# Patient Record
Sex: Male | Born: 1935
Health system: Southern US, Community
[De-identification: ages and names within clinical notes are randomized; demographics above are authoritative.]

## PROBLEM LIST (undated history)

## (undated) DIAGNOSIS — IMO0001 Reserved for inherently not codable concepts without codable children: Secondary | ICD-10-CM

## (undated) DIAGNOSIS — K635 Polyp of colon: Secondary | ICD-10-CM

## (undated) DIAGNOSIS — I2510 Atherosclerotic heart disease of native coronary artery without angina pectoris: Secondary | ICD-10-CM

## (undated) DIAGNOSIS — IMO0002 Reserved for concepts with insufficient information to code with codable children: Secondary | ICD-10-CM

## (undated) DIAGNOSIS — R0600 Dyspnea, unspecified: Secondary | ICD-10-CM

## (undated) DIAGNOSIS — G43909 Migraine, unspecified, not intractable, without status migrainosus: Secondary | ICD-10-CM

## (undated) DIAGNOSIS — I10 Essential (primary) hypertension: Secondary | ICD-10-CM

## (undated) DIAGNOSIS — I499 Cardiac arrhythmia, unspecified: Secondary | ICD-10-CM

## (undated) DIAGNOSIS — C4490 Unspecified malignant neoplasm of skin, unspecified: Secondary | ICD-10-CM

## (undated) DIAGNOSIS — D509 Iron deficiency anemia, unspecified: Secondary | ICD-10-CM

## (undated) DIAGNOSIS — K219 Gastro-esophageal reflux disease without esophagitis: Secondary | ICD-10-CM

## (undated) DIAGNOSIS — E1165 Type 2 diabetes mellitus with hyperglycemia: Secondary | ICD-10-CM

## (undated) DIAGNOSIS — K5730 Diverticulosis of large intestine without perforation or abscess without bleeding: Secondary | ICD-10-CM

## (undated) DIAGNOSIS — E039 Hypothyroidism, unspecified: Secondary | ICD-10-CM

## (undated) DIAGNOSIS — M1990 Unspecified osteoarthritis, unspecified site: Secondary | ICD-10-CM

## (undated) DIAGNOSIS — K649 Unspecified hemorrhoids: Secondary | ICD-10-CM

## (undated) DIAGNOSIS — R413 Other amnesia: Secondary | ICD-10-CM

## (undated) DIAGNOSIS — I480 Paroxysmal atrial fibrillation: Secondary | ICD-10-CM

## (undated) HISTORY — PX: BONE MARROW BIOPSY: SHX199

## (undated) HISTORY — DX: Reserved for concepts with insufficient information to code with codable children: IMO0002

## (undated) HISTORY — DX: Dyspnea, unspecified: R06.00

## (undated) HISTORY — DX: Type 2 diabetes mellitus with hyperglycemia: E11.65

## (undated) HISTORY — DX: Hypothyroidism, unspecified: E03.9

## (undated) HISTORY — DX: Atherosclerotic heart disease of native coronary artery without angina pectoris: I25.10

## (undated) HISTORY — DX: Diverticulosis of large intestine without perforation or abscess without bleeding: K57.30

## (undated) HISTORY — DX: Polyp of colon: K63.5

## (undated) HISTORY — PX: POSTERIOR LAMINECTOMY / DECOMPRESSION LUMBAR SPINE: SUR740

## (undated) HISTORY — PX: SKIN CANCER EXCISION: SHX779

## (undated) HISTORY — DX: Paroxysmal atrial fibrillation: I48.0

## (undated) HISTORY — DX: Gastro-esophageal reflux disease without esophagitis: K21.9

## (undated) HISTORY — DX: Essential (primary) hypertension: I10

## (undated) HISTORY — DX: Reserved for inherently not codable concepts without codable children: IMO0001

## (undated) HISTORY — DX: Unspecified hemorrhoids: K64.9

## (undated) HISTORY — PX: NISSEN FUNDOPLICATION: SHX2091

## (undated) HISTORY — PX: CARDIAC CATHETERIZATION: SHX172

## (undated) HISTORY — DX: Iron deficiency anemia, unspecified: D50.9

## (undated) HISTORY — PX: CATARACT EXTRACTION: SUR2

## (undated) HISTORY — PX: BACK SURGERY: SHX140

---

## 2001-01-06 DIAGNOSIS — E039 Hypothyroidism, unspecified: Secondary | ICD-10-CM

## 2001-01-06 HISTORY — DX: Hypothyroidism, unspecified: E03.9

## 2003-08-15 ENCOUNTER — Encounter (HOSPITAL_COMMUNITY): Admission: RE | Admit: 2003-08-15 | Discharge: 2003-10-03 | Payer: Self-pay | Admitting: Endocrinology

## 2003-08-18 ENCOUNTER — Ambulatory Visit (HOSPITAL_COMMUNITY): Admission: RE | Admit: 2003-08-18 | Discharge: 2003-08-18 | Payer: Self-pay | Admitting: Endocrinology

## 2003-08-28 ENCOUNTER — Ambulatory Visit (HOSPITAL_COMMUNITY): Admission: RE | Admit: 2003-08-28 | Discharge: 2003-08-28 | Payer: Self-pay | Admitting: Endocrinology

## 2003-08-28 ENCOUNTER — Encounter (INDEPENDENT_AMBULATORY_CARE_PROVIDER_SITE_OTHER): Payer: Self-pay | Admitting: *Deleted

## 2003-10-26 ENCOUNTER — Observation Stay (HOSPITAL_COMMUNITY): Admission: RE | Admit: 2003-10-26 | Discharge: 2003-10-27 | Payer: Self-pay | Admitting: Surgery

## 2003-10-26 ENCOUNTER — Encounter (INDEPENDENT_AMBULATORY_CARE_PROVIDER_SITE_OTHER): Payer: Self-pay | Admitting: *Deleted

## 2004-02-22 ENCOUNTER — Ambulatory Visit: Payer: Self-pay | Admitting: Cardiology

## 2004-02-27 ENCOUNTER — Inpatient Hospital Stay (HOSPITAL_COMMUNITY): Admission: RE | Admit: 2004-02-27 | Discharge: 2004-02-28 | Payer: Self-pay | Admitting: Neurosurgery

## 2005-11-11 ENCOUNTER — Ambulatory Visit: Payer: Self-pay | Admitting: Internal Medicine

## 2005-12-22 ENCOUNTER — Ambulatory Visit: Payer: Self-pay | Admitting: Internal Medicine

## 2006-02-03 ENCOUNTER — Ambulatory Visit: Payer: Self-pay | Admitting: Internal Medicine

## 2006-05-27 ENCOUNTER — Ambulatory Visit: Payer: Self-pay | Admitting: Cardiology

## 2006-06-25 ENCOUNTER — Ambulatory Visit: Payer: Self-pay | Admitting: Cardiology

## 2006-07-15 ENCOUNTER — Ambulatory Visit: Payer: Self-pay | Admitting: Cardiology

## 2006-07-16 ENCOUNTER — Ambulatory Visit (HOSPITAL_COMMUNITY): Admission: RE | Admit: 2006-07-16 | Discharge: 2006-07-16 | Payer: Self-pay | Admitting: Cardiology

## 2006-07-16 ENCOUNTER — Ambulatory Visit: Payer: Self-pay | Admitting: Cardiology

## 2006-07-22 ENCOUNTER — Ambulatory Visit: Payer: Self-pay | Admitting: Cardiology

## 2006-07-30 ENCOUNTER — Ambulatory Visit: Payer: Self-pay | Admitting: Cardiology

## 2006-08-05 ENCOUNTER — Ambulatory Visit (HOSPITAL_COMMUNITY): Admission: RE | Admit: 2006-08-05 | Discharge: 2006-08-05 | Payer: Self-pay | Admitting: Cardiology

## 2006-08-05 ENCOUNTER — Ambulatory Visit: Payer: Self-pay | Admitting: Internal Medicine

## 2006-08-06 ENCOUNTER — Ambulatory Visit: Payer: Self-pay | Admitting: Cardiology

## 2006-09-01 ENCOUNTER — Encounter: Payer: Self-pay | Admitting: Physician Assistant

## 2006-09-01 ENCOUNTER — Ambulatory Visit: Payer: Self-pay | Admitting: Cardiology

## 2007-01-27 ENCOUNTER — Ambulatory Visit: Payer: Self-pay | Admitting: Internal Medicine

## 2007-03-25 ENCOUNTER — Ambulatory Visit: Payer: Self-pay | Admitting: Gastroenterology

## 2007-04-01 ENCOUNTER — Ambulatory Visit: Payer: Self-pay | Admitting: Internal Medicine

## 2007-04-06 ENCOUNTER — Ambulatory Visit: Payer: Self-pay | Admitting: Internal Medicine

## 2007-04-06 ENCOUNTER — Ambulatory Visit (HOSPITAL_COMMUNITY): Admission: RE | Admit: 2007-04-06 | Discharge: 2007-04-06 | Payer: Self-pay | Admitting: Internal Medicine

## 2007-04-06 ENCOUNTER — Encounter: Payer: Self-pay | Admitting: Internal Medicine

## 2007-04-06 DIAGNOSIS — K635 Polyp of colon: Secondary | ICD-10-CM

## 2007-04-06 HISTORY — DX: Polyp of colon: K63.5

## 2007-04-06 HISTORY — PX: COLONOSCOPY: SHX174

## 2007-04-06 HISTORY — PX: ESOPHAGOGASTRODUODENOSCOPY: SHX1529

## 2007-04-19 ENCOUNTER — Ambulatory Visit (HOSPITAL_COMMUNITY): Admission: RE | Admit: 2007-04-19 | Discharge: 2007-04-19 | Payer: Self-pay | Admitting: Internal Medicine

## 2007-04-28 ENCOUNTER — Ambulatory Visit: Payer: Self-pay | Admitting: Internal Medicine

## 2007-08-06 DIAGNOSIS — D509 Iron deficiency anemia, unspecified: Secondary | ICD-10-CM

## 2007-08-06 DIAGNOSIS — D47Z9 Other specified neoplasms of uncertain behavior of lymphoid, hematopoietic and related tissue: Secondary | ICD-10-CM

## 2007-08-06 DIAGNOSIS — R5381 Other malaise: Secondary | ICD-10-CM

## 2007-08-06 DIAGNOSIS — I480 Paroxysmal atrial fibrillation: Secondary | ICD-10-CM

## 2007-08-06 DIAGNOSIS — E1165 Type 2 diabetes mellitus with hyperglycemia: Secondary | ICD-10-CM

## 2007-08-06 DIAGNOSIS — K219 Gastro-esophageal reflux disease without esophagitis: Secondary | ICD-10-CM | POA: Insufficient documentation

## 2007-08-06 DIAGNOSIS — E039 Hypothyroidism, unspecified: Secondary | ICD-10-CM | POA: Insufficient documentation

## 2007-08-06 DIAGNOSIS — I10 Essential (primary) hypertension: Secondary | ICD-10-CM | POA: Insufficient documentation

## 2007-08-06 DIAGNOSIS — R5383 Other fatigue: Secondary | ICD-10-CM

## 2007-08-30 ENCOUNTER — Encounter: Payer: Self-pay | Admitting: Cardiology

## 2007-09-17 ENCOUNTER — Encounter: Payer: Self-pay | Admitting: Cardiology

## 2007-09-24 ENCOUNTER — Encounter: Payer: Self-pay | Admitting: Cardiology

## 2007-09-28 ENCOUNTER — Encounter: Payer: Self-pay | Admitting: Cardiology

## 2007-09-30 ENCOUNTER — Encounter: Payer: Self-pay | Admitting: Cardiology

## 2007-10-19 ENCOUNTER — Encounter: Payer: Self-pay | Admitting: Cardiology

## 2007-10-29 ENCOUNTER — Encounter: Payer: Self-pay | Admitting: Cardiology

## 2007-11-11 ENCOUNTER — Ambulatory Visit: Payer: Self-pay | Admitting: Cardiology

## 2008-06-01 ENCOUNTER — Encounter: Payer: Self-pay | Admitting: Cardiology

## 2008-09-26 DIAGNOSIS — R0609 Other forms of dyspnea: Secondary | ICD-10-CM | POA: Insufficient documentation

## 2008-09-26 DIAGNOSIS — I2510 Atherosclerotic heart disease of native coronary artery without angina pectoris: Secondary | ICD-10-CM | POA: Insufficient documentation

## 2008-09-26 DIAGNOSIS — R0602 Shortness of breath: Secondary | ICD-10-CM

## 2008-10-05 ENCOUNTER — Encounter (INDEPENDENT_AMBULATORY_CARE_PROVIDER_SITE_OTHER): Payer: Self-pay | Admitting: *Deleted

## 2008-10-13 ENCOUNTER — Encounter: Payer: Self-pay | Admitting: Cardiology

## 2008-10-20 ENCOUNTER — Ambulatory Visit: Payer: Self-pay | Admitting: Gastroenterology

## 2008-10-20 DIAGNOSIS — K5909 Other constipation: Secondary | ICD-10-CM | POA: Insufficient documentation

## 2008-10-24 ENCOUNTER — Encounter: Payer: Self-pay | Admitting: Internal Medicine

## 2008-10-26 ENCOUNTER — Encounter: Payer: Self-pay | Admitting: Internal Medicine

## 2008-11-01 ENCOUNTER — Encounter: Payer: Self-pay | Admitting: Cardiology

## 2008-11-29 ENCOUNTER — Ambulatory Visit: Payer: Self-pay | Admitting: Cardiology

## 2008-12-03 ENCOUNTER — Encounter: Payer: Self-pay | Admitting: Cardiology

## 2008-12-04 ENCOUNTER — Encounter: Payer: Self-pay | Admitting: Cardiology

## 2009-05-02 ENCOUNTER — Encounter: Payer: Self-pay | Admitting: Internal Medicine

## 2009-06-01 ENCOUNTER — Ambulatory Visit: Payer: Self-pay | Admitting: Cardiology

## 2009-09-26 ENCOUNTER — Ambulatory Visit (HOSPITAL_COMMUNITY): Admission: RE | Admit: 2009-09-26 | Discharge: 2009-09-26 | Payer: Self-pay | Admitting: Neurosurgery

## 2009-11-02 ENCOUNTER — Encounter: Payer: Self-pay | Admitting: Internal Medicine

## 2009-11-19 ENCOUNTER — Telehealth (INDEPENDENT_AMBULATORY_CARE_PROVIDER_SITE_OTHER): Payer: Self-pay

## 2010-02-06 ENCOUNTER — Other Ambulatory Visit: Payer: Self-pay | Admitting: Orthopaedic Surgery

## 2010-02-06 DIAGNOSIS — M79602 Pain in left arm: Secondary | ICD-10-CM

## 2010-02-06 DIAGNOSIS — M542 Cervicalgia: Secondary | ICD-10-CM

## 2010-02-06 NOTE — Letter (Signed)
Summary: External Other  External Other   Imported By: Peggyann Shoals 05/02/2009 10:38:10  _____________________________________________________________________  External Attachment:    Type:   Image     Comment:   External Document

## 2010-02-06 NOTE — Letter (Signed)
Summary: OFFICE NOTE FROM DR Leslie Dales  OFFICE NOTE FROM DR ALTHEIMER   Imported By: Rexene Alberts 11/02/2009 09:01:43  _____________________________________________________________________  External Attachment:    Type:   Image     Comment:   External Document

## 2010-02-06 NOTE — Progress Notes (Signed)
Summary: samples  Phone Note Call from Patient   Caller: Patient Summary of Call: pt came to office requesting Amitiza samples- per LSL can give 1 weeks worth but pt will need an ov to get any more samples or Rx. Gave pt 2 boxes and informed him he will need an ov for further treatment. pt verbalized understanding.  Initial call taken by: Hendricks Limes LPN,  November 19, 2009 2:40 PM

## 2010-02-06 NOTE — Assessment & Plan Note (Signed)
Summary: 6 MO FU PER MAY REMINDER-SRS  Medications Added SYNTHROID 125 MCG TABS (LEVOTHYROXINE SODIUM) Take 1 tablet by mouth once a day METOPROLOL TARTRATE 25 MG TABS (METOPROLOL TARTRATE) Take one tablet by mouth twice a day BENAZEPRIL HCL 10 MG TABS (BENAZEPRIL HCL) Take 1 tablet by mouth once a day BENAZEPRIL HCL 5 MG TABS (BENAZEPRIL HCL) Take 1 tablet by mouth once a day FEXOFENADINE HCL 180 MG TABS (FEXOFENADINE HCL) Take 1 tablet by mouth once a day FINASTERIDE 5 MG TABS (FINASTERIDE) Take 1 tablet by mouth once a day FLOMAX 0.4 MG CAPS (TAMSULOSIN HCL) Take 1 tablet by mouth once a day TRAMADOL HCL 50 MG TABS (TRAMADOL HCL) Take 1 tablet by mouth once a day        Visit Type:  Follow-up Referring Provider:  Casimiro Needle Altheimer Primary Provider:  Dr. Doreen Beam   History of Present Illness: the patient is a 77 year old male with a history of nonobstructive coronary artery disease and paroxysmal atrial fibrillation as well as hypertension.  The patient has remained in normal sinus rhythm.  He has been doing well from a cardiovascular standpoint.  He reports no chest pain shortness of breath orthopnea or PND.  He continues to remain physically very active and works on a daily basis.  Preventive Screening-Counseling & Management  Alcohol-Tobacco     Smoking Status: quit  Comments: quit smoking about 25 yrs ago, smoked for a few years when young  Current Medications (verified): 1)  Metformin Hcl 500 Mg  Tabs (Metformin Hcl) .... Take 1 Tablet By Mouth Two Times A Day 2)  Uroxatral 10 Mg  Xr24h-Tab (Alfuzosin Hcl) .... Take 1 Tablet By Mouth Once A Day 3)  Synthroid 125 Mcg Tabs (Levothyroxine Sodium) .... Take 1 Tablet By Mouth Once A Day 4)  Metoprolol Tartrate 25 Mg Tabs (Metoprolol Tartrate) .... Take One Tablet By Mouth Twice A Day 5)  Amlodipine Besylate 10 Mg  Tabs (Amlodipine Besylate) .... Take 1 Tablet By Mouth Once A Day 6)  Hydrochlorothiazide 25 Mg  Tabs  (Hydrochlorothiazide) .... Take 1 Tablet By Mouth Once A Day 7)  Pantoprazole Sodium 40 Mg  Tbec (Pantoprazole Sodium) .... Take 1 Tablet By Mouth Once A Day 8)  Aspirin 81 Mg Chew (Aspirin) .... One By Mouth Daily 9)  Amitiza 8 Mcg Caps (Lubiprostone) .... One By Mouth With Food Up To Two Times A Day As Needed For Constipation 10)  Benefiber  Powd (Wheat Dextrin) .... Use Once Daily As Directed 11)  Celebrex 200 Mg Caps (Celecoxib) .... As Needed 12)  Benazepril Hcl 10 Mg Tabs (Benazepril Hcl) .... Take 1 Tablet By Mouth Once A Day 13)  Fexofenadine Hcl 180 Mg Tabs (Fexofenadine Hcl) .... Take 1 Tablet By Mouth Once A Day 14)  Finasteride 5 Mg Tabs (Finasteride) .... Take 1 Tablet By Mouth Once A Day 15)  Flomax 0.4 Mg Caps (Tamsulosin Hcl) .... Take 1 Tablet By Mouth Once A Day 16)  Tramadol Hcl 50 Mg Tabs (Tramadol Hcl) .... Take 1 Tablet By Mouth Once A Day  Allergies: No Known Drug Allergies  Comments:  Nurse/Medical Assistant: The patient's medications were reviewed with the patient and were updated in the Medication List. Pt brought a list of medications to office visit.  Cyril Loosen, RN, BSN (Jun 01, 2009 9:18 AM)   Past History:  Past Medical History: Last updated: 11/29/2008 CAD, NATIVE VESSEL (ICD-414.01) DYSPNEA (ICD-786.05) GASTROESOPHAGEAL REFLUX DISEASE, CHRONIC (ICD-530.81) Hx of ATRIAL FIBRILLATION, HX  OF (ICD-V12.59) OTHER LYMPHATIC AND HEMATOPOIETIC TISSUES (ICD-238.79) HYPOTHYROIDISM (ICD-244.9) FATIGUE, CHRONIC (ICD-780.79) HYPERTENSION (ICD-401.9) DIABETES MELLITUS, TYPE II, UNCONTROLLED (ICD-250.02) ANEMIA, IRON DEFICIENCY (ICD-280.9) 1. Persistent dyspnea     a.     Normal recent cardiopulmonary function test in July 2008.     b.     Undergoing current ENT evaluation at [facility].     c.     Recent negative echocardiogram with "bubble" study for inter-      cardiac shunt. 2. Normal left ventricular function. 3.  Nonobstructive coronary artery disease     a.     A cardiac catheterization in July 2008. 4. Persistent cough     a.     No recent amelioration following the addition of      Protonix/discontinuation of ACE inhibitor.     b.     Status post Nissen fundoplication 5. History of paroxysmal atrial fibrillation with rapid ventricular     response. 6. Hypothyroidism     a.     Status post total thyroidectomy in 2003. 7. Type 2 diabetes mellitus. 8. Hypertension.     Past Surgical History: Last updated: 09/26/2008 Nissen fundoplication  Bone marrow biopsy Back Surgery  Family History: Last updated: 09/26/2008 Family History of Cancer:  Family History of CVA or Stroke:   Social History: Last updated: 09/26/2008 Tobacco Use - No.  Alcohol Use - no Full Time Divorced   Social History: Smoking Status:  quit  Review of Systems  The patient denies fatigue, malaise, fever, weight gain/loss, vision loss, decreased hearing, hoarseness, chest pain, palpitations, shortness of breath, prolonged cough, wheezing, sleep apnea, coughing up blood, abdominal pain, blood in stool, nausea, vomiting, diarrhea, heartburn, incontinence, blood in urine, muscle weakness, joint pain, leg swelling, rash, skin lesions, headache, fainting, dizziness, depression, anxiety, enlarged lymph nodes, easy bruising or bleeding, and environmental allergies.    Vital Signs:  Patient profile:   77 year old male Height:      70 inches Weight:      201.50 pounds Pulse rate:   69 / minute BP sitting:   142 / 94  (left arm) Cuff size:   regular  Vitals Entered By: Cyril Loosen, RN, BSN (Jun 01, 2009 9:09 AM) Comments follow up office visit   Physical Exam  Additional Exam:  General: Well-developed, well-nourished in no distress head: Normocephalic and atraumatic eyes PERRLA/EOMI intact, conjunctiva and lids normal nose: No deformity or lesions mouth normal dentition, normal posterior pharynx neck: Supple,  no JVD.  No masses, thyromegaly or abnormal cervical nodes lungs: Normal breath sounds bilaterally without wheezing.  Normal percussion heart: regular rate and rhythm with normal S1 and S2, no S3 or S4.  PMI is normal.  No pathological murmurs abdomen: Normal bowel sounds, abdomen is soft and nontender without masses, organomegaly or hernias noted.  No hepatosplenomegaly musculoskeletal: Back normal, normal gait muscle strength and tone normal pulsus: Pulse is normal in all 4 extremities Extremities: No peripheral pitting edema neurologic: Alert and oriented x 3 skin: Intact without lesions or rashes cervical nodes: No significant adenopathy psychologic: Normal affect    EKG  Procedure date:  06/01/2009  Findings:      normal sinus rhythm normal EKG heart rate 68 beats/min  Impression & Recommendations:  Problem # 1:  CAD, NATIVE VESSEL (ICD-414.01) nonobstructive coronary artery disease no chest pain.  Patient's last catheterization was in 2008.  EKG shows no acute ischemic changes His  updated medication list for this problem includes:    Metoprolol Tartrate 25 Mg Tabs (Metoprolol tartrate) .Marland Kitchen... Take one tablet by mouth twice a day    Amlodipine Besylate 10 Mg Tabs (Amlodipine besylate) .Marland Kitchen... Take 1 tablet by mouth once a day    Aspirin 81 Mg Chew (Aspirin) ..... One by mouth daily    Benazepril Hcl 10 Mg Tabs (Benazepril hcl) .Marland Kitchen... Take 1 tablet by mouth once a day  Orders: EKG w/ Interpretation (93000)  Problem # 2:  Hx of ATRIAL FIBRILLATION, HX OF (ICD-V12.59) the patient reports no palpitations and he remains in normal sinus rhythm.  I have made no changes in his medical regimen. His updated medication list for this problem includes:    Metoprolol Tartrate 25 Mg Tabs (Metoprolol tartrate) .Marland Kitchen... Take one tablet by mouth twice a day    Amlodipine Besylate 10 Mg Tabs (Amlodipine besylate) .Marland Kitchen... Take 1 tablet by mouth once a day    Aspirin 81 Mg Chew (Aspirin) ..... One by  mouth daily    Benazepril Hcl 10 Mg Tabs (Benazepril hcl) .Marland Kitchen... Take 1 tablet by mouth once a day  Problem # 3:  HYPERTENSION (ICD-401.9) blood pressure is poorly controlled and have increased benazapril to 10 mg p.o. daily. His updated medication list for this problem includes:    Metoprolol Tartrate 25 Mg Tabs (Metoprolol tartrate) .Marland Kitchen... Take one tablet by mouth twice a day    Amlodipine Besylate 10 Mg Tabs (Amlodipine besylate) .Marland Kitchen... Take 1 tablet by mouth once a day    Hydrochlorothiazide 25 Mg Tabs (Hydrochlorothiazide) .Marland Kitchen... Take 1 tablet by mouth once a day    Aspirin 81 Mg Chew (Aspirin) ..... One by mouth daily    Benazepril Hcl 10 Mg Tabs (Benazepril hcl) .Marland Kitchen... Take 1 tablet by mouth once a day  Patient Instructions: 1)  Increase Benazepril to 10mg  daily 2)  Follow up in  1 year  Prescriptions: HYDROCHLOROTHIAZIDE 25 MG  TABS (HYDROCHLOROTHIAZIDE) Take 1 tablet by mouth once a day  #90 x 3   Entered by:   Hoover Brunette, LPN   Authorized by:   Lewayne Bunting, MD, [facility]   Signed by:   Hoover Brunette, LPN on 16/10/9602   Method used:   Electronically to        [facility] # [phone]* (retail)       [address]       [place], [place]  [address]       Ph: [phone] or [phone]       Fax: [phone]   RxID:   [id number] AMLODIPINE BESYLATE 10 MG  TABS (AMLODIPINE BESYLATE) Take 1 tablet by mouth once a day  #90 x 3   Entered by:   Hoover Brunette, LPN   Authorized by:   Lewayne Bunting, MD, [facility]   Signed by:   Hoover Brunette, LPN on 74/25/9563   Method used:   Electronically to        [facility] # [phone]* (retail)       [address]       [place], [place]  [address]       Ph: [phone] or [phone]       Fax: [phone]   RxID:   [id number] METOPROLOL TARTRATE 25 MG TABS (METOPROLOL TARTRATE) Take one tablet by mouth twice a day  #180 x 3   Entered by:   Hoover Brunette, LPN  Authorized by:   Lewayne Bunting, MD, [facility]   Signed by:   Hoover Brunette, LPN on  16/10/9602   Method used:   Electronically to        [facility] # [phone]* (retail)       [address]       [place], [place]  [address]       Ph: [phone] or [phone]       Fax: [phone]   RxID:   [id number] BENAZEPRIL HCL 10 MG TABS (BENAZEPRIL HCL) Take 1 tablet by mouth once a day  #90 x 3   Entered by:   Hoover Brunette, LPN   Authorized by:   Lewayne Bunting, MD, [facility]   Signed by:   Hoover Brunette, LPN on 74/25/9563   Method used:   Electronically to        [facility] # [phone]* (retail)       [address]       [place], [place]  [address]       Ph: [phone] or [phone]       Fax: [phone]   RxID:   [id number]

## 2010-02-07 ENCOUNTER — Other Ambulatory Visit: Payer: Self-pay | Admitting: Orthopaedic Surgery

## 2010-02-07 DIAGNOSIS — Z139 Encounter for screening, unspecified: Secondary | ICD-10-CM

## 2010-02-08 ENCOUNTER — Ambulatory Visit
Admission: RE | Admit: 2010-02-08 | Discharge: 2010-02-08 | Disposition: A | Discharge status: Home / Self Care | Payer: PRIVATE HEALTH INSURANCE | Source: Ambulatory Visit | Attending: Orthopaedic Surgery | Admitting: Orthopaedic Surgery

## 2010-02-08 DIAGNOSIS — M542 Cervicalgia: Secondary | ICD-10-CM

## 2010-02-08 DIAGNOSIS — M79602 Pain in left arm: Secondary | ICD-10-CM

## 2010-02-08 DIAGNOSIS — Z139 Encounter for screening, unspecified: Secondary | ICD-10-CM

## 2010-03-21 LAB — GLUCOSE, CAPILLARY
Glucose-Capillary: 135 mg/dL — ABNORMAL HIGH (ref 70–99)
Glucose-Capillary: 161 mg/dL — ABNORMAL HIGH (ref 70–99)

## 2010-05-21 NOTE — Assessment & Plan Note (Signed)
[name]                          [facility] OFFICE NOTE   Brett Matthews, NAVE                      MRN:          [id number]  DATE:07/22/2006                            DOB:          1932-11-06    HISTORY OF PRESENT ILLNESS:  The patient is a 77 year old male with a  history of dyspnea, cough, and weakness.  Please see my extensive note  on July 15, 2006 as it relates to this patient's problem.  In essence,  the patient underwent a cardiac catheterization to rule out  cardiovascular disease.  He has nonobstructive coronary artery disease,  normal left ventricular systolic function, and essentially normal right  heart pressures.  Dr. Antoine Poche thought that the patient may have had a  very small left to right shunt with a slight step off in his oxygen  level in the high right atrium.  However, this would not explain the  patient's dyspnea.  He was also worked up by the pulmonologist at  [facility] with a negative CT scan, and negative for pulmonary embolism.  The patient states, however, that he continues to have a cough and  shortness of breath.  The patient has a history of Nissen  fundoplication, reflux, as well as nasal congestion.  I suspect all  these issues are contributing to his cough, and it is also possible that  the ACE inhibitor is worsening his cough.   MEDICATIONS:  1. Metformin 500 mg p.o. b.i.d.  2. Synthroid 137 mcg p.o. daily.  3. Lotrel 5/20 daily.  4. Metoprolol 50 mg p.o. b.i.d.  5. Isosorbide mononitrate 30 mg p.o. daily ER.  6. Aspirin 81 mg p.o. daily.  7. Multivitamin.  8. Fish oil.   PHYSICAL EXAMINATION:  VITAL SIGNS:  Blood pressure 171/93, but on  repeat 140/84.  Heart rate is 70 beats per minute.  Saturation 97% on  room air.  Weight 204 pounds.  GENERAL:  Well-nourished white male in no apparent distress.  HEENT:  Pupils and eyes clear.  Conjunctivae clear.  NECK:  Supple.  Normal carotid upstroke.  No carotid  bruits.  LUNGS:  Clear breath sounds bilaterally.  HEART:  Regular rate and rhythm.  Normal S1, S2.  No murmurs, rubs, or  gallops.  ABDOMEN:  Soft.  EXTREMITIES:  No cyanosis, clubbing, or edema.   PROBLEM LIST:  1. Dyspnea.      a.     New York Heart Association class IIB.      b.     Decreased exercise tolerance by exercise testing.      c.     Normal ejection fraction nonischemic coronary artery       disease.      d.     Rule out for myocardial infarction in May of 2008.      e.     Ruled out for significant lung disease at [facility]  2. Hypertension, poorly controlled.  3. Cough.      a.     Rule out secondary to angiotensin-converting enzyme       inhibitor [although cough predates  use of benazepril].      b.     Congestion and drainage [history of ear, nose, and throat       surgery].      c.     No clinical or hemodynamic evidence of congestive heart       failure.      d.     Rule out occult reflux related to prior Nissen       fundoplication.  4. Pulmonary nodule evaluated by [facility]      a.     Followup 12 months for repeat CT scan.   PLAN:  1. I suspect, after extensive cardiovascular testing and pulmonary      testing, that this patient has cough and shortness of breath that      relate either to occult reflux or medication-related.  2. I have stopped the patient's Lotrel.  I have substituted this by      hydrochlorothiazide and Norvasc in doses of 25 mg and 10 mg      respectively.  3. The patient will call for a nurse visit to reevaluate the response      to treatment.  I also started antireflux measures with Protonix 40      mg a day and asked him to take Allegra on a daily basis.  4. It the patient continues to complain of dyspnea and cough, then we      will proceed with an ultrasound with contrast to rule out      intracardiac shunt and also CPX testing.     Brett Codding, MD,FACC  Electronically Signed    GED/MedQ  DD: 07/22/2006   DT: 07/22/2006  Job #: [id number]

## 2010-05-21 NOTE — Op Note (Signed)
Brett Matthews, Brett Matthews               ACCOUNT NO.:  [id number]   MEDICAL RECORD NO.:  [id number]          PATIENT TYPE:  AMB   LOCATION:  DAY                           FACILITY:  [facility]   PHYSICIAN:  R. Roetta Sessions, M.D. DATE OF BIRTH:  [birth year]   DATE OF PROCEDURE:  04/06/2007  DATE OF DISCHARGE:                               OPERATIVE REPORT   PROCEDURES:  Esophagogastroduodenoscopy with small bowel biopsy followed  by colonoscopy and biopsy.   INDICATIONS FOR PROCEDURE:  A 74-year gentleman with a bona fide iron-  deficiency anemia, hemoccult negative x4, and has chronic  gastroesophageal reflux disease symptoms with breakthrough symptoms,  status post Nissen fundoplication, requiring proton pump inhibitor  therapy.  Last colonoscopy was a good three years ago without  significant findings (elsewhere).  EGD and colonoscopy now being done.  This approach was discussed with Brett Matthews at length.  The risks,  benefits, limitations, and alternatives have been discussed.  His  questions were answered and he was agreeable.  Please see documentation  in medical record.   PROCEDURE NOTE:  O2 saturation, blood pressure and pulse respirations  were monitored throughout the entire procedure.  Conscious sedation was  achieved with Versed 4 mg IV and Demerol 50 mg IV in divided doses.   INSTRUMENT:  Pentax video-chip system.   FINDINGS:  EGD examination:  Tubular esophagus revealed normal-appearing  mucosa was somewhat accentuated undulating Z-line.  However, the gut and  mucosa appeared normal.  EG junction was easily traversed.  Stomach:  Gastric cavity was empty and insufflated well with air.  Thorough examination of the gastric mucosa, including retroflexion view  of the proximal stomach and esophagogastric junction demonstrated intact  Nissen fundoplication and appeared appropriately snug.  The remainder of  gastric mucosa appeared normal.  The pylorus was patent and easily  traversed.  Exam of the bulb and second portion revealed no  abnormalities.  Therapeutic/diagnostic maneuvered biopsies, D2 and D3,  were taken to rule out villous atrophy/inflammation.  The patient  tolerated the procedure and was prepped for colonoscopy.  Digital rectal  exam revealed no abnormalities.  The quality of the prep was good.  Colon:  Colonic mucosa was surveyed from the rectosigmoid junction  through the left transverse right colon to the appendiceal orifice and  ileocecal valve.  These structures were well seen and photographed for  the record.  From this level, the Pentax scope was cautiously withdrawn.  All previous mentioned mucosal surfaces were again seen in a combination  of fold flattening and tip flexion.  The patient had scattered  pancolonic diverticula.  There was a diminutive polyp at 35 cm (mid  sigmoid) which was cold biopsy/removed.  The colonic mucosa appeared  normal.  The scope was pulled down the rectum for thorough examination  of rectal mucosa, including retroflex view of anal verge, and  demonstrated no abnormalities.  The patient tolerated both procedures  well and was reactive after endoscopy.   IMPRESSION:  1. Esophagogastroduodenoscopy:  Normal esophagus status post Nissen      fundoplication, intact Nissen wrap; otherwise, normal stomach,  patent pylorus and normal D1 through D3, status of biopsy D2 to D3.  2. Colonoscopy findings:  Normal rectum with scattered pancolonic      diverticula and slightly redundant elongated colon, diminutive      polyp midsigmoid, status post cold biopsy removal.  Remainder of      colonic mucosa appeared normal.  Certainly no evidence of neoplasm      in the upper or lower GI tract.   RECOMMENDATIONS:  1. We will follow up on small bowel biopsy.  If small bowel biopsy is      unrevealing, we will proceed with a given GI BEN capsule study in      this gentleman's small bowel.  2. Further recommendations to  follow.      Brett Matthews, M.D.  Electronically Signed     RMR/MEDQ  D:  04/06/2007  T:  04/06/2007  Job:  [id number]   cc:   Brett Matthews, M.D.  Fax: [phone]   Brett Beam, MD  Fax: [phone]

## 2010-05-21 NOTE — Op Note (Signed)
[name], Brett Matthews               ACCOUNT NO.:  [id number]   MEDICAL RECORD NO.:  [id number]          PATIENT TYPE:  AMB   LOCATION:  DAY                           FACILITY:  [facility]   PHYSICIAN:  R. Roetta Sessions, M.D. DATE OF BIRTH:  Dec 20, 1932   DATE OF PROCEDURE:  04/19/2007  DATE OF DISCHARGE:  04/19/2007                               OPERATIVE REPORT   REASON FOR PROCEDURE:  Iron deficiency anemia.   PHYSICIAN REQUESTING PROCEDURE:  Jonathon Bellows, M.D.   PHYSICIAN CONSULTING NOTE:  Jonathon Bellows, M.D.   PROCEDURE:  Small bowel Given capsule endoscopy.   INDICATIONS FOR PROCEDURE:  A 77 year old gentleman with history of iron  deficiency anemia, hemoccult negative x4 in the setting of chronic GERD  with breakthrough symptoms, status post Nissen fundoplication requiring  PPI therapy.  Recent EGD and colonoscopy on April 06, 2007, revealed an  intact Nissen wrap.  Small bowel biopsies were negative.  Colonoscopy  was good prep, revealed scattered pancolonic diverticula and a slightly  redundant elongated colon, a hyperplastic mid sigmoid polyp, which was  removed.  Small bowel capsule endoscopy is being done to further  evaluate iron deficiency anemia.  He does take Arthrotec on a p.r.n.  basis.   PROCEDURE FINDINGS:  The patient swallowed capsule without difficulty.  First gastric image was at 28 seconds.  The gastric passage time was 36  minutes.  First duodenal image was at 37 minutes and 3 seconds.  At 48  minutes and at 1 hour 6 minutes, he had a couple of lymphangiectasias.  Small bowel passage time was 4 hours 34 minutes.  The small bowel mucosa  appeared normal.  First ileocecal valve at 5 hours 11 minutes and 44  seconds, and the first cecal image at 5 hours 11 minutes and 46 seconds.   FINDINGS AND RECOMMENDATIONS:  Small bowel appeared normal on this  study.  No findings to explain iron deficiency anemia.  I will call and  discuss findings with the patient.   We will inquire more regarding his  NSAID use and would limit this as much as possible.  We will recheck a  hemoglobin and see where we are at this point.  Further recommendations  to follow.  Please note the study was reviewed by Dr. Jena Gauss.       Tana Coast, P.AJonathon Bellows, M.D.  Electronically Signed    LL/MEDQ  D:  04/28/2007  T:  04/29/2007  Job:  [id number]   cc:   Veverly Fells. Altheimer, M.D.  Fax: [phone]   Drew Dr. Iona Coach

## 2010-05-21 NOTE — Assessment & Plan Note (Signed)
[facility]                          [facility] NOTE   Brett Matthews, Brett Matthews                      MRN:          [id number]  DATE:07/15/2006                            DOB:          08/19/1932    REFERRING PHYSICIAN:  Dhruv Vyas   HISTORY OF PRESENT ILLNESS:  The patient is a 77 year old male with a  history of dyspnea and chest pain.  The patient was admitted on May 28, 2006 and was ruled out for myocardial infarction.  The patient underwent  an outpatient stress test which showed an ejection fraction of 57%.  The  patient only had average exercise tolerance, but had no acute ischemic  changes on Cardiolite imaging.  The patient also has a prior history of  atrial fibrillation, but remains in normal sinus rhythm.  His cardinal  complaint has been worsening dyspnea over the last several months.  On  the last office visit on June 25, 2006 the patient was in the process of  seeing a pulmonologist.  He was scheduled for several studies including  a CT scan of the chest.  I told her that I would defer any cardiac  workup until he was fully evaluated by the pulmonologist.  The patient  also reported a cough that has been going on for the last 6 months.  He  does also have problems with sinus drainage.   Today, however the patient states that he is doing worse.  He has now  developed shortness of breath on minimal exertion.  He states he lost  all of his stamina.  He has no energy.  Currently he does however have  no evidence of chest pain on exertion.  He also has been started on  Imdur, but has not taken any p.r.n. nitroglycerin.   Of note, his pulmonary workup as outlined above was negative.  CT scan  showed no evidence of pulmonary embolism.  There was a small nodule seen  on CT which should be followed up in 12 months.  Arrangements were made  to have this done with his primary Brett Matthews.   MEDICATIONS:  1. Metformin 5 mg p.o. b.i.d.  2. Aspirin daily.  3. Synthroid 0.075 mg p.o. daily.  4. Lotrel 5/20 mg daily.  5. Metoprolol 50 mg p.o. b.i.d.  6. Uroxatral 10 mg p.o. daily.   PHYSICAL EXAMINATION:  VITAL SIGNS:  Blood pressure 140/92, heart rate  73, weight 203 pounds.  GENERAL:  Well-nourished white male in no apparent distress.  HEENT:  Normal.  NECK:  A little crepitus, no carotid bruits.  LUNGS:  Clear breath sounds bilaterally, no wheezing.  HEART:  Regular rate and rhythm with normal S1, S2, no murmurs, rubs or  gallops.  ABDOMEN:  Soft and nontender, there is no rebound or guarding.  EXTREMITIES:  No cyanosis, clubbing or edema.   PROBLEM LIST:  1. Worsening dyspnea.      a.     New York Heart Association Class IIb-III  b..  Decreased exercise tolerance by exercise testing.  c.  Ejection fraction 57% with no ischemia on  Cardiolite imaging several  months ago.  d.  Sharlene Motts out for myocardial infarction in May of 2008.  1. Hypertension.  Poorly controlled.  2. Cough.      a.     Rule out secondary to ACE inhibitors (cough antedates use of       benazepril).      b.     Cyanosis and drainage.      c.     Rule out congestive heart failure.  3. Pulmonary nodule evaluated by [facility].  See details above.      a.     Follow up in 12 months with repeat CT scan.   PLAN:  1. The patient's symptoms of dyspnea are concerning.  It appears to      have worsened rapidly.  The patient has not used any nitroglycerin      in the interim.  He states that he is hardly able to do anything at      this point in time.  2. As laid out in my note from June 25, 2006 I recommend to proceed      with left and right heart catheterization in the event there was no      definite cause found by his pulmonologist.  3. Discussed risk and benefit of a catheterization with the patient      and he is willing to proceed.  Given his worsening symptoms we will      no longer defer the study, but will schedule this tomorrow in  the      [facility] lab.   Of note, the patient's brother also had a recent intervention to a  widow maker lesion.  He is very concerned about this and he brought up  in the office on several occasions what happened with [employer] as  well as Dr. Eliberto Ivory here in [place].     Learta Codding, MD,FACC  Electronically Signed    GED/MedQ  DD: 07/15/2006  DT: 07/16/2006  Job #: [id number]   cc:   Doreen Beam

## 2010-05-21 NOTE — H&P (Signed)
NAME:  Brett Matthews, Brett Matthews               ACCOUNT NO.:  [id number]   MEDICAL RECORD NO.:  [id number]          PATIENT TYPE:  AMB   LOCATION:  DAY                           FACILITY:  [facility]   PHYSICIAN:  Kassie Mends, M.D.      DATE OF BIRTH:  [birth year]   DATE OF ADMISSION:  DATE OF DISCHARGE:  LH                              HISTORY & PHYSICAL   REQUESTING PHYSICIAN:  Dr. Sherril Croon   CHIEF COMPLAINT:  Needs EGD and colonoscopy, history of iron-deficiency  anemia.   HISTORY OF PRESENT ILLNESS:  Mr. Brett Matthews is a 77 year old Caucasian male.  He has a history of chronic left-sided abdominal pain with constipation  felt to be due to constipation predominant IBS.  He gives a history of  having had a normal colonoscopy in 2005 by Dr. Gabriel Cirri.  More recently,  he has been noted to have microcytic anemia through his  endocrinologist's office.  He was found to have a hemoglobin of 9.7,  hematocrit 29.8 and MCV of 73.4.  It was recommended when he was seen  last year for him to turn in hemoccult cards.  He did not follow through  with this.  He was last seen on January 27, 2007.  He was asked to get a  chem-20, a TSH, a CBC and a calcium.  However, he has not followed up on  this until today.  Today, he denies any rectal bleeding or melena.  He  has a history of chronic GERD and has been on pantoprazole 40 mg daily  for about 3 months now.  He denies any dysphagia or odynophagia.  He  denies any anorexia.  He takes occasional Arthrotec for arthritis.  He  has been having a bowel movement every day or every other day.  He  complains of intermittent left lower quadrant pain.  When asked what it  feels like, he says I do know.  He was taking Amitiza previously that  did seem to help.  He has discontinued it due to loose stools.  He  denies any nausea or vomiting at this time.  He denies any fever or  chills.  He has lost 10 pounds in the past year.   PAST MEDICAL AND SURGICAL HISTORY:  He has a history of  hypertension,  diabetes mellitus and hypothyroidism.  He has chronic GERD status post  Nissen fundoplication approximately 7 years ago by Dr. Gabriel Cirri.  He has  a history of BPH.  He has had back surgery.  He has had skin cancer and  thyroidectomy.  He had a normal colonoscopy, per his report, in 2005 by  Dr. Gabriel Cirri.  He has chronic constipation and newly found microcytic  anemia.   CURRENT MEDICATIONS:  1. Metformin 500 mg b.i.d.  2. Uroxatral 10 mg daily.  3. Amitiza 24 mcg daily as needed.  4. Metoprolol 50 mg daily.  5. Hydrochlorothiazide 25 mg daily.  6. Pantoprazole 40 mg daily.  7. Synthroid 137 mcg daily.  8. Cyclobenzaprine 10 mg p.r.n.  9. Arthrotec 50 mg p.r.n.   ALLERGIES:  NO KNOWN DRUG  ALLERGIES.   FAMILY HISTORY:  There is no known family history of colorectal  carcinoma, liver or chronic GI problems.  Mother deceased at 21  secondary to CVA.  Father deceased at age 5 secondary to metastatic  pharyngeal carcinoma.  He has multiple siblings.   SOCIAL HISTORY:  Mr. Brett Matthews is divorced.  He has two grown healthy sons.  He is self-employed with an [employer].  He has  a history of cigar use.  He denies any alcohol or drug use.   REVIEW OF SYSTEMS:  See HPI, otherwise negative.   PHYSICAL EXAMINATION:  VITAL SIGNS:  Weight 199.5 pounds.  Height 69  inches.  Temperature 98.1.  Blood pressure 118/80.  Pulse is 64.  GENERAL APPEARANCE:  Mr. Brett Matthews is a well-developed, well-nourished,  Caucasian male who is alert, oriented, pleasant and cooperative in no  acute distress.  HEENT:  Sclerae nonicteric.  Conjunctivae pink.  Oropharynx is moist  without lesions.  NECK:  Supple without mass or thyromegaly.  CHEST/HEART:  Regular rate and rhythm.  Normal S1, S2 without murmurs,  thrills, rubs or gallops.  LUNGS:  Clear to auscultation bilaterally.  ABDOMEN:  Positive bowel sounds x4.  No bruits auscultated.  Soft,  nontender and nondistended without  palpable mass or hepatosplenomegaly.  No rebound tenderness or guarding.  EXTREMITIES:  Without clubbing or edema bilaterally.  RECTAL:  No external lesions visualized.  Good sphincter tone.  A small  amount of medium brown stool was obtained from vault which is Hemoccult  negative.   IMPRESSION:  [name] is a 77 year old male with microcytic anemia.  He is Hemoccult negative in the office today.  He has chronic  gastroesophageal reflux disease status post Nissen fundoplication with  refractory symptoms now on proton pump inhibitor.  He also has history  of chronic constipation and left lower quadrant abdominal pain.  His  last colonoscopy was approximately 3 years ago and done at another  facility.  He has also lost 10 pounds in the last year unintentionally.  I feel that this warrants further evaluation to rule out colorectal  carcinoma with a colonoscopy and he will need esophagogastroduodenoscopy  at the same time to determine whether his Nissen fundoplication is  intact and whether there is evidence of peptic ulcer disease or  gastrointestinal bleeding, but, again, he was Hemoccult negative here in  the office today.   PLAN:  1. EGD and colonoscopy with Dr. Jena Gauss in the near future.  We      discussed this procedure including risks and benefits which include      but not limited to bowel  bleeding, infection, perforation, drug      reaction and he agrees and consent will be obtained.  He is take      half of metformin the day prior to the procedure.  2. Anemia panel and H&H today.  3. Hemoccult stools x3.  4. Continue pantoprazole 40 mg daily.      Lorenza Burton, N.P.      Kassie Mends, M.D.  Electronically Signed    KJ/MEDQ  D:  03/25/2007  T:  03/26/2007  Job:  [id number]   cc:   Veverly Fells. Altheimer, M.D.  Fax: [phone]   Doreen Beam, MD  Fax: [phone]

## 2010-05-21 NOTE — Assessment & Plan Note (Signed)
NAME:  BLAKE, VETRANO                CHART#:  [id number]   DATE:  01/27/2007                       DOB:  05-15-1932   He was last seen here for chronic constipation, left-sided abdominal  pain on 02/03/06.  Please note that he was a no-show for his  06/17__________  and 05/06/06 appointments with [place].  He returns taking  Amitiza only on a p.r.n. basis.  States he just does not have an urge to  have a bowel movement.  He may go 3-4 days without a bowel movement.  MiraLax hurt his stomach and made him sick when he took it.  He just did  not like taking it.  He is status post a thyroidectomy and is on a  thyroid supplement under Dr. Berline Lopes direction down in [place].  I do not have a TSH, serum calcium, or any recent blood work on this  gentleman.  He is not having any rectal bleeding.  He does not have any  difficulty really evacuating, he just has no urge and infrequent bowel  movements.  Dr. Joellen Jersey performed a colonoscopy on him in 2005 reported  to be negative.  He is not taking any narcotics although he is taking  Uroxatral and cyclobenzaprine.   CURRENT MEDICATIONS:  See updated list.   ALLERGIES:  No known drug allergies.   PHYSICAL EXAMINATION:  GENERAL:  He appears to be in no acute distress.  VITAL SIGNS:  Weight 199, down from 207 back on 12/22/05, and 209 on  02/03/06.  Height 5 feet 9 inches.  Temperature 97.6.  BP 122/78.  Pulse  72.  SKIN:  Warm and dry.  CHEST:  Lungs are clear to auscultation.  HEART:  Regular rate and rhythm without murmur, gallop, or rub.  ABDOMEN:  Nondistended.  Positive bowel sounds.  Soft.  Nontender  without appreciable mass or organomegaly.  EXTREMITIES:  No edema.   ASSESSMENT:  The patient describes chronic constipation.  It sounds like  a colonic inertia to me more than anything else.  He really never got  off taking Amitiza b.i.d. on any irregular basis.  He does not like  taking MiraLax.   RECOMMENDATIONS:  We will give him  Amitiza 24 mcg with meals twice a  day.  Described the off-label use with this agent in a male.  Warned  about side effects of nausea and headache.  He is to keep a stool  diarrhea on the counter.  We will get a Chem-20, TSH, and a CBC and  review his calcium when his labs become available.  He wants to get his  labs done at Dr. Berline Lopes.  I have given him a prescription with  these labs to be drawn.  Hold off on MiraLax  for now.  Depending on what his response is to__________ we are going to  see him back in 1 month.  We will consider a Sitz Marker study.       Jonathon Bellows, M.D.  Electronically Signed     RMR/MEDQ  D:  01/27/2007  T:  01/27/2007  Job:  [id number]   cc:   Veverly Fells. Altheimer, M.D.

## 2010-05-21 NOTE — Assessment & Plan Note (Signed)
[facility]                          [facility] NOTE   Brett Matthews                      MRN:          [id number]  DATE:09/01/2006                            DOB:          06-09-1932    REASON FOR VISIT:  Scheduled clinic followup.  Please refer to Dr. Vernie Shanks DeGent's recent clinic note of July 22, 2006, for full details.   HISTORY:  At that time, the patient's medications were adjusted in this  ongoing attempt to ameliorate his persistent cough (he states for the  last 12 months).  Therefore, Dr. Andee Lineman substituted Lotrel with  hydrochlorothiazide 25 mg daily and Norvasc 10 mg daily.  He was also  placed on Protonix for treatment of known reflux disease.  Regarding  this, the patient seems to feel that his cough has not significantly  changed for the better; however, in general he seems to feel that his  shortness of breath has improved somewhat.   The patient also underwent a formal cardiopulmonary function testing on  August 05, 2006, and this was essentially entirely within normal limits.   Brett Matthews then informs me that he cut back on metoprolol on his own,  from 50 mg twice daily to 50 mg q.a.m.  He states that he recently  checked his blood pressure and the bottom number was 61,  He also  seems to feel a little bit better since cutting back on the metoprolol.  The patient continues to report no chest pain.  He has moderate  exertional dyspnea but denies any PND.   Of note, the patient also refers to some occasional fluttering, which  is apparently longstanding.  This happens several times a week.  It can  last for several minutes; however, there are no significant associated  symptoms nor any associated dyspnea.  The patient was diagnosed with PAF  with RVR in 2005, and states that he was on Coumadin for approximately  one month afterwards.   The electrocardiogram today reveals NSR at 71 BPM's with left axis  deviation, no ischemic changes.   CURRENT MEDICATIONS:  1. Hydrochlorothiazide 25 mg daily.  2. Norvasc 10 mg daily.  3. Fexofenadine 180 mg daily.  4. Metoprolol 50 mg q.a.m.  5. Diclofenac 75 mg twice daily.  6. Fish oil.  7. Aspirin 81 mg daily.  8. Imdur 30 mg daily.  9. Synthroid 137 daily.  10.Metformin 500 mg twice daily.  11.Uroxatral 10 mg daily.   PHYSICAL EXAMINATION:  VITAL SIGNS:  Blood pressure 135/80, pulse 81 and  regular, weight 204.6 pounds.  GENERAL:  A 77 year old male, sitting upright, in no distress.  HEENT:  Normocephalic and atraumatic.  NECK:  Palpable carotid pulses without bruits.  No jugular venous  distention.  LUNGS:  Clear to auscultation in all fields.  HEART:  A regular rate and rhythm (S1 and S2).  No significant murmurs.  No rubs or gallops.  ABDOMEN:  Benign.  EXTREMITIES:  No significant edema.  NEUROLOGIC:  No focal deficits.   IMPRESSION:  1. Persistent dyspnea      a.  Normal recent cardiopulmonary function test in July 2008.      b.     Undergoing current ENT evaluation at [facility].      c.     Recent negative echocardiogram with bubble study for inter-       cardiac shunt.  2. Normal left ventricular function.  3. Nonobstructive coronary artery disease      a.     A cardiac catheterization in July 2008.  4. Persistent cough      a.     No recent amelioration following the addition of       Protonix/discontinuation of ACE inhibitor.      b.     Status post Nissen fundoplication  5. History of paroxysmal atrial fibrillation with rapid ventricular      response.  6. Hypothyroidism      a.     Status post total thyroidectomy in 2003.  7. Type 2 diabetes mellitus.  8. Hypertension.   PLAN:  1. No further evaluation for persistent exertional dyspnea is      indicated at this time, in light of recent normal cardiopulmonary      function test.  The patient is to continue ongoing evaluation by       his ENT physician in [place], where he apparently had a      recent test just last week.  2. Arrange CardioNet monitoring for further evaluation of      fluttering, particularly in light of the documented history of      paroxysmal atrial fibrillation.  3. Schedule a return clinic followup with myself and with Dr. Andee Lineman      in two months, for a review of the monitor results and further      recommendations.      [name], PA-C  Electronically Signed      Learta Codding, MD,FACC  Electronically Signed   GS/MedQ  DD: 09/01/2006  DT: 09/02/2006  Job #: [id number]   cc:   Doreen Beam

## 2010-05-21 NOTE — Cardiovascular Report (Signed)
[name], [name]               ACCOUNT NO.:  [id number]   MEDICAL RECORD NO.:  [id number]          PATIENT TYPE:  OIB   LOCATION:  2899                         FACILITY:  [facility]   PHYSICIAN:  Rollene Rotunda, MD, FACCDATE OF BIRTH:  [birth year]   DATE OF PROCEDURE:  07/16/2006  DATE OF DISCHARGE:  07/16/2006                            CARDIAC CATHETERIZATION   PRIMARY CARE PHYSICIAN:  Dr. Doreen Beam.   CARDIOLOGIST:  Dr. Lewayne Bunting.   PROCEDURE:  Left and right heart catheterization/coronary arteriography.   INDICATION FOR PROCEDURE:  Evaluate for dyspnea.   PROCEDURE NOTE:  Left heart catheterization performed via the right  femoral artery.  Right heart catheterization performed the right femoral  vein.  Both vessels cannulated using the wall puncture.  A #6-French  arterial sheath and #7-French venous sheath were inserted via the  modified Seldinger technique.  Preformed Judkins, pigtail and Swan-Ganz  catheter were utilized.  All left heart catheter exchanges were via a  wire exchange.   HEMODYNAMIC RESULTS:  RA mean 10, RV 35/7, PA 33/14 with a mean of 22,  pulmonary capillary pressure mean 16, LV 148/20, AO 151/79, cardiac  output/cardiac index (Fick), 5.1/2.5, cardiac output/cardiac index  (thermodilution) 6.8 mL/3.3.  Saturations:  High RA 74, mid RA 70, low  RA 66, SVC 67, IVC 72, PA 68, RV 68.  Coronaries:  The left main was  normal.  The LAD had an ostial 25% stenosis.  A large branching first  diagonal with ostial 50% stenosis and diffuse luminal irregularities.  There was a second diagonal which was moderate size and normal.  The  circumflex in the AV groove was normal.  There was a large ramus  intermediate with ostial 25% stenosis and mid-luminal irregularities.  There was a mid-obtuse marginal which was large and normal.  The right  coronary artery is a large dominant vessel.  There were diffuse luminal  irregularities.  The PDA had diffuse luminal  irregularities.  There was  moderate size posterolateral which was normal.   LEFT VENTRICULOGRAM:  A left ventriculogram was obtained in the RAO  projection, EF 70% and normal.   CONCLUSION:  Nonobstructive coronary artery disease.  Normal pulmonary  artery pressures.  Well-preserved ejection fraction.  We know that there  was no ischemia in the distribution of the diagonal on the Cardiolite.  Therefore, this lesion does not appear to be hemodynamically  significant.  There was a very tiny shunt fraction noted with a 0.05  liter per minute left-to-right flow, with a very slight step-up the  right atrium.  I can not exclude a small left-to-right shunt, though his  pulmonary pressures are not high, and this would not explain his  significant objective dyspnea.   However, I have suggested to Dr. Andee Lineman that he obtain a bubble study  echocardiogram, and we will arrange this.  He will see the patient in  followup.      Rollene Rotunda, MD, [facility]  Electronically Signed     JH/MEDQ  D:  07/16/2006  T:  07/17/2006  Job:  [id number]   cc:   [name]  Herbert Pun, MD,FACC

## 2010-05-21 NOTE — Assessment & Plan Note (Signed)
[facility]                          [facility]   Brett Matthews, Brett Matthews                      MRN:          [id number]  DATE:06/25/2006                            DOB:          1932/08/14    REFERRING PHYSICIAN:  Dhruv Vyas   HISTORY OF PRESENT ILLNESS:  The patient is a 77 year old male with a  history of dyspnea and chest pain. The patient was admitted on May 28, 2006 and was ruled out for myocardial infarction. Underwent outpatient  stress testing which showed an ejection fraction of 57%. The patient had  average exercise tolerance, but had no acute ischemic findings on the  Cardiolite imaging study. The patient also has a history of atrial  fibrillation, but remains in normal sinus rhythm. His main complaint has  been worsening dyspnea. He is now seeing a pulmonologist in [place]. He  is scheduled for several studies, the nature of which I do not know. He  will see the pulmonologist in the next few days.   The patient denies __________ chest pain, but remains dyspneic.   MEDICATIONS:  The patient is not clear about his medication list, but we  pulled this from the [employer]. There is a report of:  1. Amlodipine.  2. Benazepril.  3. Diltiazem.  4. Enalapril.  5. Isosorbide.  6. Lidocaine.  7. Toprol.  8. Propranolol.  But, we are not sure if the patient is on all of these medications and  what doses. He is not able to give this to [place]. He states that he will  call back with the medication list.   PHYSICAL EXAMINATION:  VITAL SIGNS: Blood pressure 150/81, heart rate  68, weight is 202 pounds.  NECK: Normal carotid upstroke. No carotid bruits.  LUNGS:  Clear breath sounds bilaterally.  HEART: Regular rate and rhythm. Normal S1, S2. No murmur, rubs or  gallops.  ABDOMEN: Soft.  EXTREMITIES: No cyanosis, clubbing or edema.  NEURO: The patient is alert, oriented and grossly nonfocal.   PROBLEM LIST:  1. Exertional  dyspnea.      a.     Rule out ischemic heart disease. Negative Cardiolite study.      b.     Ruled out for myocardial infarction on recent admission.  2. History of atrial fibrillation, normal sinus rhythm.  3. Diabetes mellitus.  4. Hypertension.  5. Hypothyroidism.  6. Diastolic dysfunction.   PLAN:  1. The patient will be seen by the pulmonologist in the next few days.      If this workup is negative, then we will proceed possibly with left      and right heart catheterization to evaluate his dyspnea.  2. Told the patient that I would want to wait for the results of the      pulmonary evaluation as his Cardiolite study was essentially within      normal limits.  3. The patient will followup with [place] in one month to discuss final      arrangements for catheterization.     Learta Codding, MD,FACC  Electronically Signed  GED/MedQ  DD: 06/25/2006  DT: 06/25/2006  Job #: [id number]   cc:   Doreen Beam

## 2010-05-24 NOTE — Op Note (Signed)
Brett Matthews, Brett Matthews               ACCOUNT NO.:  [id number]   MEDICAL RECORD NO.:  [id number]          PATIENT TYPE:  AMB   LOCATION:  DAY                          FACILITY:  [facility]   PHYSICIAN:  Velora Heckler, MD      DATE OF BIRTH:  26-Feb-1932   DATE OF PROCEDURE:  10/26/2003  DATE OF DISCHARGE:                                 OPERATIVE REPORT   PREOPERATIVE DIAGNOSIS:  Bilateral thyroid nodules, hyperthyroidism.   POSTOPERATIVE DIAGNOSIS:  Bilateral thyroid nodules, hyperthyroidism.   PROCEDURE:  Total thyroidectomy.   SURGEON:  Velora Heckler, MD   ASSISTANT:  Adolph Pollack, M.D.   ANESTHESIA:  General.   ESTIMATED BLOOD LOSS:  Minimal.   PREPARATION:  Betadine.   COMPLICATIONS:  None.   INDICATIONS:  Patient is a 77 year old white male from [place], [place].  He is followed by Dr. Casimiro Needle Altheimer.  He was found to have bilateral  thyroid nodules.  He presented with multinodular goiter and a rapid  ventricular response with atrial fibrillation, due to hyperthyroidism.  He  was controlled with beta blockade.  Ultrasound demonstrated a 2.5 cm solid  mass in the lower pole of the right thyroid lobe.  Aspiration was performed  which showed cytologic atypia in a background of follicular epithelium.  The  left thyroid lobe also showed a dominant solid cystic mass measuring 2.6 cm  in size.  Patient now comes to surgery for resection.   BODY OF REPORT:  The procedure is done in OR #11 at the [facility].  Patient is brought to the operating room and placed in  a supine position on the operating room table.  Following administration of  general anesthesia, the patient is positioned and then prepped and draped in  the usual strict aseptic fashion.  After ascertaining that an adequate level  of anesthesia had been obtained, a Kocher incision is made with a #10 blade.  Dissection is carried down through subcutaneous tissues and platysma.  Hemostasis  is obtained with the electrocautery.  Skin flaps are developed  cephalad and caudad from the thyroid notch to the sternal notch.  A Mahorner  self-retaining retractor is placed for exposure.  Strap muscles are incised  in the midline.  Dissection is carried down to the trachea.  The strap  muscles are reflected to the left.  The left thyroid lobe is exposed.  Dissection is somewhat difficult due to inflammatory changes.  The strap  muscles appear to be adherent to the surface of the gland.  This is gently  dissected out with a Barista.  Vascular tributaries are divided  between small and medium Ligaclips.  Superior pole vessels are dissected  out, ligated in continuity with 2-0 silk ties and medium Ligaclips and  divided.  Inferior venous tributaries are divided between small Ligaclips  and divided.  The gland is rolled anteriorly.  The recurrent laryngeal nerve  is identified and preserved.  Branches of the inferior thyroid artery was  divided between small Ligaclips.  The ligament of Allyson Sabal is transected with  the  electrocautery, and the gland is rolled up and onto the anterior surface  of the trachea.   Next, we turned our attention to the right side.  Again, inflammatory  changes are noted in the right neck.  Strap muscles are somewhat adherent to  the gland.  These are gently mobilized with the Kitner dissector and use of  the electrocautery.  Inferior venous tributaries are divided between medium  Ligaclips.  Superior pole vessels are again isolated, ligated in continuity  with 2-0 silk ties and medium Ligaclips and divided.  The gland is rolled  anteriorly.  The branches of the inferior thyroid artery are divided between  small Ligaclips.  The gland is rolled onto the anterior trachea and excised  off of the trachea using the electrocautery for hemostasis.  The entire  gland is excised.  The sutures are used to mark the left superior pole.  The  total gland is submitted to  pathology for review.  The neck is irrigated  with warm saline, and hemostasis obtained with medium Ligaclips.  Surgicel  is placed over the area of the recurrent nerves bilaterally.  Strap muscles  are reapproximated in the midline with interrupted 3-0 Vicryl sutures.  Platysma is closed with interrupted 3-0 Vicryl sutures.  The skin is closed  with a running 4-0 Vicryl subcuticular suture.  The wound is washed and  dried, and Benzoin and Steri-Strips are applied.  Sterile dressings are  applied.  The patient is awakened from anesthesia and brought to the  recovery room in stable condition in stable condition.  Patient tolerated  the procedure well.     Todd   TMG/MEDQ  D:  10/26/2003  T:  10/26/2003  Job:  [id number]   cc:   Veverly Fells. Altheimer, M.D.  [address], [address]  [place]  [place] [address]  Fax: [phone]

## 2010-05-24 NOTE — Op Note (Signed)
NAMEJERL, MUNYAN               ACCOUNT NO.:  [id number]   MEDICAL RECORD NO.:  [id number]          PATIENT TYPE:  INP   LOCATION:  2899                         FACILITY:  [facility]   PHYSICIAN:  Clydene Fake, M.D.  DATE OF BIRTH:  December 20, 1932   DATE OF PROCEDURE:  02/27/2004  DATE OF DISCHARGE:                                 OPERATIVE REPORT   PREOPERATIVE DIAGNOSIS:  Herniated nucleus pulposus right L3-4.   POSTOPERATIVE DIAGNOSIS:  Herniated nucleus pulposus right L3-4.   PROCEDURE:  Right L3-4 semi-hemilaminectomy and diskectomy, micro-dissection  with microscope.   SURGEON:  Clydene Fake, M.D.   ASSISTANT:  Cristi Loron, M.D.   ANESTHESIA:  General endotracheal tube anesthesia.   ESTIMATED BLOOD LOSS:  Was 100 mL.   REPLACED:  None.   DRAINS:  None.   COMPLICATIONS:  None.   INDICATIONS FOR PROCEDURE:  The patient is a 77 year old gentleman who has  had back and right leg pain, numbness and weakness, with 4/5 strength in the  right quadriceps and decreased sensation in the right L4 root with a large  disk herniation on the right side at L3-4 with a large free fragment going  in caudally.  The patient is brought in for a diskectomy.   DESCRIPTION OF PROCEDURE:  The patient was brought to the operating room and  general anesthesia was induced.  The patient was placed in the prone  position on the Wilson frame with all pressure points padded.  The patient  was prepped and draped in a sterile fashion.  A needle was placed in the  interspace.  X-rays were obtained showing this was in the L3-4 interspace.  Then 10 mL of 0.25% Marcaine without epinephrine and 8 mL of 1% lidocaine  with epinephrine was injected into the site of the incision.  An incision  was then made in the midline lower lumbar spine and centered over where the  needle was.  The incision was taken down to the fascia and hemostasis was  obtained with Bovie cauterization.  The fascia was incised  with the Bovie on  the right side and a subperiosteal dissection was done over the L3 and L4  spinous processes and lamina out to the facets.  A marker was placed in the  L3-4 interspace and another x-ray was obtained confirming our positioning at  the L3-4 interspace.  A self-retaining retractor was then placed.  The  microscope was then brought in for micro-dissection.  A high-speed drill was  used to start a semi-hemilaminectomy at L3-4, and a medial facetectomy, and  this was completed with Kerrison punches.  We did remove a fair amount of  the L4 lamina and did a foraminotomy with the L4 root, because of the caudal  extension of the disk herniation.  We then dissected in the epidural space  and found a large free fragment of disk up under the nerve root and we were  able to remove in a few pieces.  When we were finished, we had a good  decompression.  We followed the disk herniation up towards  the disk space,  and there was a large rent into the disk space, and more disk herniation,  herniated out as we pushed on the annulus.  We removed these pieces of disk  and incised the disk space, and a diskectomy was performed at L3-4 with  pituitary rongeurs and curets.  When we were finished, we had good  decompression of the central canal and the L3-4 roots.  The wound was  irrigated with antibiotic solution.  Hemostasis was obtained with bipolar  cauterization and Gelfoam and thrombin.  The Gelfoam was then irrigated out.  We had good hemostasis.  The retractors were removed.  The fascia was closed  with #0 Vicryl interrupted suture.  The subcutaneous was closed with #0, #2-  0 and #3-0 Vicryl interrupted sutures.  The skin was closed with Benzoin and  Steri-Strips.  Dressing was placed.   The patient was placed back into the supine position, awakened from  anesthesia, and was transferred to the recovery room in stable condition.      JRH/MEDQ  D:  02/27/2004  T:  02/27/2004  Job:   [id number]

## 2010-06-17 ENCOUNTER — Other Ambulatory Visit: Payer: Self-pay | Admitting: Cardiology

## 2010-06-19 ENCOUNTER — Encounter: Payer: Self-pay | Admitting: Cardiology

## 2010-07-13 ENCOUNTER — Other Ambulatory Visit: Payer: Self-pay | Admitting: Cardiology

## 2010-07-18 ENCOUNTER — Encounter: Payer: Self-pay | Admitting: Cardiology

## 2010-07-24 ENCOUNTER — Ambulatory Visit (INDEPENDENT_AMBULATORY_CARE_PROVIDER_SITE_OTHER): Payer: Medicare Other | Admitting: Cardiology

## 2010-07-24 ENCOUNTER — Encounter: Payer: Self-pay | Admitting: Cardiology

## 2010-07-24 VITALS — BP 133/82 | HR 70 | Ht 70.0 in | Wt 201.0 lb

## 2010-07-24 DIAGNOSIS — E785 Hyperlipidemia, unspecified: Secondary | ICD-10-CM

## 2010-07-24 DIAGNOSIS — I2510 Atherosclerotic heart disease of native coronary artery without angina pectoris: Secondary | ICD-10-CM

## 2010-07-24 DIAGNOSIS — I4891 Unspecified atrial fibrillation: Secondary | ICD-10-CM

## 2010-07-24 DIAGNOSIS — I10 Essential (primary) hypertension: Secondary | ICD-10-CM

## 2010-07-24 NOTE — Patient Instructions (Signed)
Your physician recommends that you go to the [facility] for lab work for CMET & FLP.  Reminder:  Nothing to eat or drink after 12 midnight prior to labs. If the results of your test are normal or stable, you will receive a letter.  If they are abnormal, the nurse will contact you by phone. Your physician wants you to follow up in: 6 months.  You will receive a reminder letter in the mail one-two months in advance.  If you don't receive a letter, please call our office to schedule the follow up appointment.

## 2010-07-24 NOTE — Assessment & Plan Note (Signed)
Blood pressure well controlled. Continue current medical regimen. 

## 2010-07-24 NOTE — Progress Notes (Signed)
HPI The patient is a 77 year old male with a history of nonobstructive coronary artery disease and paroxysmal atrial fibrillation as well as hypertension. The patient remains in normal sinus rhythm. Electrocardiogram was obtained today. He continues to do well from a cardiovascular perspective. He reports no chest pain shortness of breath orthopnea or PND. He has gone through some financial difficulties and has developed IBS symptoms with constipation. He denies any orthopnea PND palpitations or syncope.  No Known Allergies  Current Outpatient Prescriptions on File Prior to Visit  Medication Sig Dispense Refill  . amLODipine (NORVASC) 10 MG tablet Take 10 mg by mouth daily.        Marland Kitchen aspirin 81 MG tablet Take 81 mg by mouth daily.        . benazepril (LOTENSIN) 10 MG tablet Take 10 mg by mouth daily.        . finasteride (PROSCAR) 5 MG tablet Take 5 mg by mouth daily.        . hydrochlorothiazide 25 MG tablet take 1 tablet by mouth once daily  90 tablet  1  . levothyroxine (SYNTHROID, LEVOTHROID) 125 MCG tablet Take 125 mcg by mouth daily.        . metFORMIN (GLUCOPHAGE) 500 MG tablet Take 500 mg by mouth 2 (two) times daily with a meal.        . metoprolol tartrate (LOPRESSOR) 25 MG tablet TAKE 1 TABLET BY MOUTH TWICE A DAY  180 tablet  3  . pantoprazole (PROTONIX) 40 MG tablet Take 40 mg by mouth daily.        . Tamsulosin HCl (FLOMAX) 0.4 MG CAPS Take 0.4 mg by mouth daily.          Past Medical History  Diagnosis Date  . CAD (coronary artery disease)     native vessel  . Dyspnea   . Gastroesophageal reflux disease     chronic  . Atrial fibrillation   . Neoplasm of lymphatic and hematopoietic tissue   . Hypothyroidism   . Fatigue     Chronic  . Hypertension   . DM (diabetes mellitus), type 2, uncontrolled   . Anemia, iron deficiency   . Dyspnea     Persistent; a. Normal recent cariopulmonary function test in July 2008. b. Undergoing current ENT evaluation at [facility]. c. Recent negative echocardiogram with "bubble" study form inter-cardiac shunt.  Marland Kitchen CAD (coronary artery disease)     nonobstructive, a cardiac catheterization in July 2008  . Cough     Persistent, a. No recent amelioration following the addition of Protonix/discontinuation of ACE inhibitor. b. Status post Nissen fundoplication  . Paroxysmal atrial fibrillation     with rapid ventricular response  . Hypothyroidism 2003    Status post total thyroidectomy  . Diabetes mellitus, type 2   . Hypertension     Normal LV funciton    Past Surgical History  Procedure Date  . Nissen fundoplication   . Bone marrow biopsy   . Back surgery     Family History  Problem Relation Age of Onset  . Cancer    . Stroke      History   Social History  . Marital Status: Single    Spouse Name: N/A    Number of Children: N/A  . Years of Education: N/A   Occupational History  . Full Time    Social History Main Topics  . Smoking status: Never Smoker   . Smokeless tobacco: Never Used  .  Alcohol Use: No  . Drug Use: Not on file  . Sexually Active: Not on file   Other Topics Concern  . Not on file   Social History Narrative  . No narrative on file   RUE:[id number] positives as outlined above. The remainder of the 18  point review of systems is negative   PHYSICAL EXAM BP 133/82  Pulse 70  Ht 5\' 10"  (1.778 m)  Wt 201 lb (91.173 kg)  BMI 28.84 kg/m2  General: Well-developed, well-nourished in no distress Head: Normocephalic and atraumatic Eyes:PERRLA/EOMI intact, conjunctiva and lids normal Ears: No deformity or lesions Mouth:normal dentition, normal posterior pharynx Neck: Supple, no JVD.  No masses, thyromegaly or abnormal cervical nodes Lungs: Normal breath sounds bilaterally without wheezing.  Normal percussion Cardiac: regular rate and rhythm with normal S1 and S2, no S3 or S4.  PMI is normal.  No pathological murmurs Abdomen: Normal bowel sounds, abdomen is  soft and nontender without masses, organomegaly or hernias noted.  No hepatosplenomegaly MSK: Back normal, normal gait muscle strength and tone normal Vascular: Pulse is normal in all 4 extremities Extremities: No peripheral pitting edema Neurologic: Alert and oriented x 3 Skin: Intact without lesions or rashes Lymphatics: No significant adenopathy Psychologic: Normal affect  ECG: Normal sinus rhythm no acute ischemic changes.  ASSESSMENT AND PLAN

## 2010-07-24 NOTE — Assessment & Plan Note (Signed)
No recurrent chest pain. Continue medical therapy. No stress testing needed at the present time

## 2010-07-24 NOTE — Assessment & Plan Note (Signed)
Will obtain liver function tests and fasting lipid panel.

## 2010-10-22 LAB — POCT I-STAT 3, ART BLOOD GAS (G3+)
Acid-Base Excess: 2
Bicarbonate: 27.2 — ABNORMAL HIGH
O2 Saturation: 96
TCO2: 28
pCO2 arterial: 41.8
pO2, Arterial: 83

## 2010-10-22 LAB — POCT I-STAT 3, VENOUS BLOOD GAS (G3P V)
Acid-Base Excess: 1
Acid-Base Excess: 1
Acid-Base Excess: 2
Bicarbonate: 26.6 — ABNORMAL HIGH
Bicarbonate: 26.8 — ABNORMAL HIGH
Bicarbonate: 28.1 — ABNORMAL HIGH
Bicarbonate: 28.1 — ABNORMAL HIGH
Bicarbonate: 28.1 — ABNORMAL HIGH
O2 Saturation: 66
O2 Saturation: 68
O2 Saturation: 68
O2 Saturation: 70
O2 Saturation: 72
O2 Saturation: 74
Operator id: 166961
TCO2: 28
TCO2: 28
TCO2: 29
TCO2: 30
TCO2: 30
pCO2, Ven: 46.9
pCO2, Ven: 47.5
pCO2, Ven: 48.5
pH, Ven: 7.35 — ABNORMAL HIGH
pH, Ven: 7.373 — ABNORMAL HIGH
pH, Ven: 7.379 — ABNORMAL HIGH
pH, Ven: 7.382 — ABNORMAL HIGH
pO2, Ven: 36
pO2, Ven: 37
pO2, Ven: 37

## 2011-01-07 ENCOUNTER — Other Ambulatory Visit: Payer: Self-pay | Admitting: Cardiology

## 2011-05-21 ENCOUNTER — Other Ambulatory Visit: Payer: Self-pay | Admitting: Cardiology

## 2011-06-19 DIAGNOSIS — R079 Chest pain, unspecified: Secondary | ICD-10-CM

## 2011-06-25 ENCOUNTER — Telehealth: Payer: Self-pay | Admitting: Cardiology

## 2011-06-25 NOTE — Telephone Encounter (Signed)
No chest pain.  SOB, breathing hard all the time.  Due to see Dr. Sherril Croon this Friday, 6/21.  Repeated c/o just not feeling good and states he just can not hardly go.  Feels like he needs stents.  Advised him repeated to go to ED or call 911 for continued symptoms.  Will discuss with Dr. Andee Lineman at patient request.  Has OV scheduled for July 1 with Dr. Antoine Poche.

## 2011-06-25 NOTE — Telephone Encounter (Signed)
Patient has been feeling weak and SOB since his stress test last week.  He wants to know if he should be seen.

## 2011-06-26 ENCOUNTER — Inpatient Hospital Stay (HOSPITAL_COMMUNITY)
Admission: AD | Admit: 2011-06-26 | Discharge: 2011-06-27 | DRG: 287 | Disposition: A | Discharge status: Home / Self Care | Payer: Medicare Other | Source: Other Acute Inpatient Hospital | Attending: Internal Medicine | Admitting: Internal Medicine

## 2011-06-26 ENCOUNTER — Other Ambulatory Visit: Payer: Self-pay | Admitting: Cardiology

## 2011-06-26 ENCOUNTER — Encounter (HOSPITAL_COMMUNITY): Payer: Self-pay | Admitting: General Practice

## 2011-06-26 ENCOUNTER — Other Ambulatory Visit: Payer: Self-pay | Admitting: Physician Assistant

## 2011-06-26 DIAGNOSIS — E785 Hyperlipidemia, unspecified: Secondary | ICD-10-CM | POA: Diagnosis present

## 2011-06-26 DIAGNOSIS — M129 Arthropathy, unspecified: Secondary | ICD-10-CM | POA: Diagnosis present

## 2011-06-26 DIAGNOSIS — Z85828 Personal history of other malignant neoplasm of skin: Secondary | ICD-10-CM

## 2011-06-26 DIAGNOSIS — R0602 Shortness of breath: Secondary | ICD-10-CM | POA: Diagnosis present

## 2011-06-26 DIAGNOSIS — I10 Essential (primary) hypertension: Secondary | ICD-10-CM | POA: Diagnosis present

## 2011-06-26 DIAGNOSIS — Z87898 Personal history of other specified conditions: Secondary | ICD-10-CM

## 2011-06-26 DIAGNOSIS — E119 Type 2 diabetes mellitus without complications: Secondary | ICD-10-CM | POA: Diagnosis present

## 2011-06-26 DIAGNOSIS — R079 Chest pain, unspecified: Secondary | ICD-10-CM

## 2011-06-26 DIAGNOSIS — I4891 Unspecified atrial fibrillation: Secondary | ICD-10-CM | POA: Diagnosis present

## 2011-06-26 DIAGNOSIS — R0789 Other chest pain: Principal | ICD-10-CM | POA: Diagnosis present

## 2011-06-26 DIAGNOSIS — I2510 Atherosclerotic heart disease of native coronary artery without angina pectoris: Secondary | ICD-10-CM | POA: Diagnosis present

## 2011-06-26 DIAGNOSIS — E890 Postprocedural hypothyroidism: Secondary | ICD-10-CM | POA: Diagnosis present

## 2011-06-26 DIAGNOSIS — R0609 Other forms of dyspnea: Secondary | ICD-10-CM | POA: Diagnosis present

## 2011-06-26 DIAGNOSIS — K219 Gastro-esophageal reflux disease without esophagitis: Secondary | ICD-10-CM | POA: Diagnosis present

## 2011-06-26 DIAGNOSIS — E876 Hypokalemia: Secondary | ICD-10-CM | POA: Diagnosis present

## 2011-06-26 DIAGNOSIS — IMO0001 Reserved for inherently not codable concepts without codable children: Secondary | ICD-10-CM | POA: Diagnosis present

## 2011-06-26 DIAGNOSIS — D509 Iron deficiency anemia, unspecified: Secondary | ICD-10-CM | POA: Diagnosis present

## 2011-06-26 HISTORY — DX: Unspecified malignant neoplasm of skin, unspecified: C44.90

## 2011-06-26 HISTORY — DX: Unspecified osteoarthritis, unspecified site: M19.90

## 2011-06-26 HISTORY — DX: Migraine, unspecified, not intractable, without status migrainosus: G43.909

## 2011-06-26 LAB — CREATININE, SERUM
Creatinine, Ser: 0.97 mg/dL (ref 0.50–1.35)
GFR calc Af Amer: 90 mL/min — ABNORMAL LOW (ref >90)
GFR calc non Af Amer: 78 mL/min — ABNORMAL LOW (ref >90)

## 2011-06-26 LAB — CBC
HCT: 37.3 % — ABNORMAL LOW (ref 39.0–52.0)
Platelets: 253 10*3/uL (ref 150–400)
RDW: 13.5 % (ref 11.5–15.5)
WBC: 5.6 10*3/uL (ref 4.0–10.5)

## 2011-06-26 MED ORDER — NITROGLYCERIN 0.4 MG SL SUBL: 0.4000 mg | SUBLINGUAL_TABLET | SUBLINGUAL | Status: DC | PRN

## 2011-06-26 MED ORDER — SODIUM CHLORIDE 0.9 % IJ SOLN: 3.0000 mL | INTRAMUSCULAR | Status: DC | PRN

## 2011-06-26 MED ORDER — INSULIN ASPART 100 UNIT/ML ~~LOC~~ SOLN: 0.0000 [IU] | Freq: Every day | SUBCUTANEOUS | Status: DC

## 2011-06-26 MED ORDER — SODIUM CHLORIDE 0.9 % IV SOLN: 250.0000 mL | INTRAVENOUS | Status: DC | PRN

## 2011-06-26 MED ORDER — SODIUM CHLORIDE 0.9 % IJ SOLN: 3.0000 mL | Freq: Two times a day (BID) | INTRAMUSCULAR | Status: DC

## 2011-06-26 MED ORDER — ASPIRIN 81 MG PO CHEW
324.0000 mg | CHEWABLE_TABLET | ORAL | Status: AC
  Administered 2011-06-27: 324 mg via ORAL
  Filled 2011-06-26: qty 4

## 2011-06-26 MED ORDER — DIAZEPAM 5 MG PO TABS
5.0000 mg | ORAL_TABLET | ORAL | Status: AC
  Administered 2011-06-27: 5 mg via ORAL
  Filled 2011-06-26: qty 1

## 2011-06-26 MED ORDER — ALFUZOSIN HCL ER 10 MG PO TB24
10.0000 mg | ORAL_TABLET | Freq: Every day | ORAL | Status: DC
  Administered 2011-06-27: 10 mg via ORAL
  Filled 2011-06-26 (×2): qty 1

## 2011-06-26 MED ORDER — ATORVASTATIN CALCIUM 80 MG PO TABS
80.0000 mg | ORAL_TABLET | Freq: Every day | ORAL | Status: DC
  Administered 2011-06-27: 80 mg via ORAL
  Filled 2011-06-26: qty 1

## 2011-06-26 MED ORDER — METOPROLOL TARTRATE 25 MG PO TABS
25.0000 mg | ORAL_TABLET | Freq: Two times a day (BID) | ORAL | Status: DC
  Administered 2011-06-26 – 2011-06-27 (×2): 25 mg via ORAL
  Filled 2011-06-26 (×3): qty 1

## 2011-06-26 MED ORDER — ACETAMINOPHEN 325 MG PO TABS: 650.0000 mg | ORAL_TABLET | ORAL | Status: DC | PRN

## 2011-06-26 MED ORDER — SODIUM CHLORIDE 0.9 % IJ SOLN
3.0000 mL | Freq: Two times a day (BID) | INTRAMUSCULAR | Status: DC
  Administered 2011-06-26 – 2011-06-27 (×2): 3 mL via INTRAVENOUS

## 2011-06-26 MED ORDER — ZOLPIDEM TARTRATE 5 MG PO TABS
5.0000 mg | ORAL_TABLET | Freq: Once | ORAL | Status: AC
  Administered 2011-06-27: 5 mg via ORAL
  Filled 2011-06-26: qty 1

## 2011-06-26 MED ORDER — ENOXAPARIN SODIUM 40 MG/0.4ML ~~LOC~~ SOLN
40.0000 mg | SUBCUTANEOUS | Status: DC
  Filled 2011-06-26 (×2): qty 0.4

## 2011-06-26 MED ORDER — ONDANSETRON HCL 4 MG/2ML IJ SOLN: 4.0000 mg | Freq: Four times a day (QID) | INTRAMUSCULAR | Status: DC | PRN

## 2011-06-26 MED ORDER — ASPIRIN EC 81 MG PO TBEC
81.0000 mg | DELAYED_RELEASE_TABLET | Freq: Every day | ORAL | Status: DC
  Administered 2011-06-27: 81 mg via ORAL
  Filled 2011-06-26: qty 1

## 2011-06-26 MED ORDER — PANTOPRAZOLE SODIUM 40 MG PO TBEC
40.0000 mg | DELAYED_RELEASE_TABLET | Freq: Every day | ORAL | Status: DC
  Administered 2011-06-27: 40 mg via ORAL
  Filled 2011-06-26: qty 1

## 2011-06-26 MED ORDER — LEVOTHYROXINE SODIUM 137 MCG PO TABS
137.0000 ug | ORAL_TABLET | Freq: Every day | ORAL | Status: DC
  Administered 2011-06-27: 137 ug via ORAL
  Filled 2011-06-26 (×2): qty 1

## 2011-06-26 MED ORDER — INSULIN ASPART 100 UNIT/ML ~~LOC~~ SOLN
0.0000 [IU] | Freq: Three times a day (TID) | SUBCUTANEOUS | Status: DC
  Administered 2011-06-27: 2 [IU] via SUBCUTANEOUS

## 2011-06-26 MED ORDER — SODIUM CHLORIDE 0.9 % IV SOLN
INTRAVENOUS | Status: DC
  Administered 2011-06-27: 05:00:00 via INTRAVENOUS

## 2011-06-26 MED ORDER — AMLODIPINE BESYLATE 10 MG PO TABS
10.0000 mg | ORAL_TABLET | Freq: Every day | ORAL | Status: DC
  Administered 2011-06-27: 10 mg via ORAL
  Filled 2011-06-26: qty 1

## 2011-06-26 NOTE — H&P (Signed)
NAME:  Brett Matthews, Brett Matthews ROOM:   UNIT NUMBER:  [id number] LOCATION: ER ADM/VISIT DATE:  06/26/2011   ADM Brett BrownerKathaleen Matthews:  [id number] DOB: [birth year]   PRIMARY CARDIOLOGIST:  Lewayne Bunting, M.D.  REFERRING PHYSICIAN:  Van Clines, M.D., [facility] emergency department  REASON FOR CONSULTATION:  Chest pain.  HISTORY OF PRESENT ILLNESS:  Brett Matthews is a 78 year old male with history of nonobstructive CAD, recently briefly hospitalized here with chest pain, for which he ruled out with all serial cardiac markers within normal limits.  We proceeded with a Lexiscan Myoview study for risk stratification, which was reviewed by Dr. Diona Matthews, and which suggested a small, reversible defect in the inferolateral/anterolateral region, suggestive of mid lateral ischemia; EF 57%.  Recommendation was to treat medically and arrange for early post hospital followup.  He was cleared for discharge on 06/20/2011.  Since then, however, he has remained easily fatigued, although he denies any exertional chest pain.  He contacted our office yesterday, complaining of continued difficulty breathing, and was subsequently advised, by Dr. Andee Lineman, to proceed directly to the emergency room.  The patient is currently hemodynamically stable and without complaint of chest pain.  Admission EKG indicates normal sinus rhythm with nonspecific ST changes.  A set of cardiac markers is within normal limits, and D-dimer is normal.  Chest x-ray is negative for any evidence of heart failure.  ALLERGIES:  No known drug allergies.  HOME MEDICATIONS: 1. Metformin 500 mg b.i.d. 2. Uroxatral 10 mg daily. 3. Metoprolol tartrate 50 mg daily. 4. Synthroid 0.137 mcg daily. 5. Hydrochlorothiazide 25 mg daily. 6. Protonix 40 mg daily. 7. Amlodipine 10 mg daily. 8. Aspirin 81 mg daily. 9. Omega 3 fish oil 1 tablet daily.  PAST MEDICAL HISTORY: 1. Nonobstructive CAD. a. Cardiac catheterization, 07/2006. 2. HTN. 3. DM. 4. HLD. 5. Paroxysmal  atrial fibrillation. 6. Hypothyroidism. a. Status post total thyroidectomy. 7. Iron deficiency anemia. 8. Neoplasm of lymphatic and hematopoietic tissue. 9. GERD.  SURGICAL HISTORY: 1. Nissen fundoplication. 2. Bone marrow biopsy. 3. Back surgery.  SOCIAL HISTORY:  Negative for tobacco or alcohol use.  FAMILY HISTORY:  Negative for premature coronary artery disease.  REVIEW OF SYSTEMS:  Denies symptoms suggestive of active reflux disease.  Denies evidence of overt bleeding.  the patient complains of right calf claudication, shortly after onset of walking.  Otherwise, as noted per HPI, the remaining systems reviewed are negative.  PHYSICAL EXAMINATION:  Vital signs:  Blood pressure currently 129/79, pulse 60s - 70s, regular, respirations 18, temperature 97.4, sats 99% on 2 liters, weight 198 pounds.  General:  A 78 year old male, well-nourished, well-developed, sitting upright in no distress.  HEENT:  Normocephalic, atraumatic.  PERRLA.  EOMI.  Neck:  Palpable bilateral carotid pulses without bruits; no JVD at 90 degrees.  Lungs:  Clear to auscultation bilaterally.  Heart:  Regular rate and rhythm.  No significant murmurs, no rubs or gallops.  Abdomen:  Soft, intact bowel sounds.  No bruits.  Extremities:  Palpable bilateral femoral pulses with bilateral bruits.  Palpable posterior tibialis and dorsalis pedis pulses.  No peripheral edema.  Skin:  Warm and dry.  Musculoskeletal:  No obvious deformity.  Neurologic:  No focal deficits.  IMAGING: 1. Admission chest x-ray:  No acute changes. 2. Admission EKG:  Normal sinus rhythm at 67 BPM; LAD; nonspecific ST changes.  LABORATORY DATA:  CPK 84/0.8 and troponin I less than 0.01.  D-dimer 0.27.  INR 0.9.  TSH 2.7.  _____ 139, potassium 3.6,  BUN 14, creatinine 0.9, glucose 94.  CBC:  Normal.  IMPRESSION: 1. Exertional dyspnea. a. Question anginal equivalent. b. Recent abnormal Lexiscan Myoview:  Probable lateral ischemia; EF  57%. 2. Nonobstructive coronary artery disease. a. Cardiac catheterization, 2008. 3. Multiple cardiac risk factors. a. Diabetes mellitus. b. Hypertension. c. Hyperlipidemia. d. Age. 4. History of paroxysmal atrial fibrillation. 5. Hypothyroidism. 6. Intermittent claudication.  PLAN:  Recommendation is to transfer the patient directly to [facility] so as to proceed with diagnostic coronary angiography and possible percutaneous intervention.  This will be scheduled for tomorrow morning.  The patient is in agreement with this recommendation, and the risks/benefits were discussed in conjunction with Dr. Andee Lineman.  Of note, recommend further evaluation of possible peripheral arterial disease with peripheral angiography, concomitant with the coronary angiogram.  Please refer to Dr. Margarita Mail addendum note for complete details.  [name], PA, dictating for Lewayne Bunting, M.D.   __________________________    Rozell Searing, P.A. Alinda Money D: 06/26/2011 1447 T: 06/26/2011 1539 P: SER2   Addendum: The patient is a 78 year old male well-known to me. He was recently hospitalized with substernal chest pain and increased shortness of breath. Reportedly the patient ruled out for myocardial infarction. However on discharge a Myoview study was done which showed lateral ischemia. The patient had called our office earlier this morning stating that he was significant short of breath on minimal exertion. I reviewed the data from his brief hospitalization and noted the positive Myoview study. The patient was seen and examined in the emergency room. He reports tightness both at rest and on exertion, substernal without radiation. He also reports that he short of breath on minimal exertion. The patient stated his symptoms have worsened since her last hospitalization. I discussed with the patient that given his positive Myoview study should proceed with diagnostic cardiac catheterization and he is in agreement with  that. I discussed the risks and benefits of a diagnostic cardiac catheterization with the patient.   We also discussed the radial versus femoral approach.  In particular I quoted that the risk of bleeding from the radial artery is usually less than 1%.  I also explained however that the procedure is more challenging for the cardiologist and does involve a slightly greater radiation exposure although scientist estimate that increased radiation is equivalent to 20 chest x-rays representing only a small risk of the patient.   The following general risks were quoted to the patient for a diagnostic cardiac catheterization:     Peyton Bottoms 5:58 PM 06/26/2011

## 2011-06-26 NOTE — Telephone Encounter (Signed)
Discussed below with Dr. Andee Lineman. GD did not see this patient in the hospital.  Dr. Myrtis Ser, Dr. Diona Browner, & Gene saw this patient. GD reviewed stress test & echo & does advise him to come to ED for evaluation.  Stress test is abnormal.  He verbalized understanding & will come to [facility] ED.  Will notify ED.

## 2011-06-27 ENCOUNTER — Ambulatory Visit (HOSPITAL_COMMUNITY): Admit: 2011-06-27 | Payer: Self-pay | Admitting: Cardiovascular Disease

## 2011-06-27 ENCOUNTER — Encounter (HOSPITAL_COMMUNITY)
Admission: AD | Disposition: A | Discharge status: Home / Self Care | Payer: Self-pay | Source: Other Acute Inpatient Hospital | Attending: Internal Medicine

## 2011-06-27 DIAGNOSIS — E876 Hypokalemia: Secondary | ICD-10-CM | POA: Diagnosis present

## 2011-06-27 DIAGNOSIS — I2510 Atherosclerotic heart disease of native coronary artery without angina pectoris: Secondary | ICD-10-CM

## 2011-06-27 HISTORY — PX: LEFT HEART CATHETERIZATION WITH CORONARY ANGIOGRAM: SHX5451

## 2011-06-27 LAB — BASIC METABOLIC PANEL
BUN: 15 mg/dL (ref 6–23)
CO2: 29 mEq/L (ref 19–32)
Calcium: 9.1 mg/dL (ref 8.4–10.5)
Creatinine, Ser: 0.91 mg/dL (ref 0.50–1.35)
Glucose, Bld: 121 mg/dL — ABNORMAL HIGH (ref 70–99)

## 2011-06-27 LAB — LIPID PANEL
HDL: 44 mg/dL (ref >39)
LDL Cholesterol: 103 mg/dL — ABNORMAL HIGH (ref 0–99)
Total CHOL/HDL Ratio: 4.2 RATIO

## 2011-06-27 LAB — GLUCOSE, CAPILLARY
Glucose-Capillary: 121 mg/dL — ABNORMAL HIGH (ref 70–99)
Glucose-Capillary: 132 mg/dL — ABNORMAL HIGH (ref 70–99)
Glucose-Capillary: 95 mg/dL (ref 70–99)

## 2011-06-27 SURGERY — LEFT HEART CATHETERIZATION WITH CORONARY ANGIOGRAM
Anesthesia: LOCAL

## 2011-06-27 MED ORDER — HYDROCHLOROTHIAZIDE 25 MG PO TABS
25.0000 mg | ORAL_TABLET | Freq: Every day | ORAL | Status: DC
  Filled 2011-06-27: qty 1

## 2011-06-27 MED ORDER — NITROGLYCERIN 0.4 MG SL SUBL: 0.4000 mg | SUBLINGUAL_TABLET | SUBLINGUAL | Status: DC | PRN

## 2011-06-27 MED ORDER — MIDAZOLAM HCL 2 MG/2ML IJ SOLN
INTRAMUSCULAR | Status: AC
  Filled 2011-06-27: qty 2

## 2011-06-27 MED ORDER — AMLODIPINE BESYLATE 10 MG PO TABS
10.0000 mg | ORAL_TABLET | Freq: Every day | ORAL | Status: DC
  Filled 2011-06-27: qty 1

## 2011-06-27 MED ORDER — OMEGA-3-ACID ETHYL ESTERS 1 G PO CAPS
1.0000 g | ORAL_CAPSULE | Freq: Every day | ORAL | Status: DC
  Administered 2011-06-27: 1 g via ORAL
  Filled 2011-06-27: qty 1

## 2011-06-27 MED ORDER — LEVOTHYROXINE SODIUM 137 MCG PO TABS: 137.0000 ug | ORAL_TABLET | Freq: Every day | ORAL | Status: DC

## 2011-06-27 MED ORDER — POTASSIUM CHLORIDE CRYS ER 20 MEQ PO TBCR
40.0000 meq | EXTENDED_RELEASE_TABLET | Freq: Once | ORAL | Status: AC
  Administered 2011-06-27: 40 meq via ORAL
  Filled 2011-06-27: qty 2

## 2011-06-27 MED ORDER — METFORMIN HCL 500 MG PO TABS: 500.0000 mg | ORAL_TABLET | Freq: Two times a day (BID) | ORAL | Status: DC

## 2011-06-27 MED ORDER — NITROGLYCERIN 0.2 MG/ML ON CALL CATH LAB
INTRAVENOUS | Status: AC
  Filled 2011-06-27: qty 1

## 2011-06-27 MED ORDER — METFORMIN HCL 500 MG PO TABS
500.0000 mg | ORAL_TABLET | Freq: Two times a day (BID) | ORAL | Status: DC
  Administered 2011-06-27: 500 mg via ORAL
  Filled 2011-06-27 (×2): qty 1

## 2011-06-27 MED ORDER — HEPARIN (PORCINE) IN NACL 2-0.9 UNIT/ML-% IJ SOLN
INTRAMUSCULAR | Status: AC
  Filled 2011-06-27: qty 2000

## 2011-06-27 MED ORDER — DICLOFENAC SODIUM 75 MG PO TBEC
75.0000 mg | DELAYED_RELEASE_TABLET | Freq: Two times a day (BID) | ORAL | Status: DC | PRN
  Filled 2011-06-27: qty 1

## 2011-06-27 MED ORDER — FOLIC ACID 1 MG PO TABS
1.0000 mg | ORAL_TABLET | Freq: Every day | ORAL | Status: DC
  Administered 2011-06-27: 1 mg via ORAL
  Filled 2011-06-27: qty 1

## 2011-06-27 MED ORDER — FENTANYL CITRATE 0.05 MG/ML IJ SOLN
INTRAMUSCULAR | Status: AC
  Filled 2011-06-27: qty 2

## 2011-06-27 MED ORDER — GLUCOSAMINE-CHONDROITIN 500-400 MG PO TABS: 1.0000 | ORAL_TABLET | Freq: Two times a day (BID) | ORAL | Status: DC

## 2011-06-27 MED ORDER — LUBIPROSTONE 24 MCG PO CAPS
24.0000 ug | ORAL_CAPSULE | Freq: Two times a day (BID) | ORAL | Status: DC
  Filled 2011-06-27 (×2): qty 1

## 2011-06-27 MED ORDER — LIDOCAINE HCL (PF) 1 % IJ SOLN
INTRAMUSCULAR | Status: AC
  Filled 2011-06-27: qty 30

## 2011-06-27 MED ORDER — ONDANSETRON HCL 4 MG/2ML IJ SOLN: 4.0000 mg | Freq: Four times a day (QID) | INTRAMUSCULAR | Status: DC | PRN

## 2011-06-27 MED ORDER — ADULT MULTIVITAMIN W/MINERALS CH
1.0000 | ORAL_TABLET | Freq: Every day | ORAL | Status: DC
  Administered 2011-06-27: 1 via ORAL
  Filled 2011-06-27: qty 1

## 2011-06-27 MED ORDER — METOPROLOL TARTRATE 25 MG PO TABS
25.0000 mg | ORAL_TABLET | Freq: Two times a day (BID) | ORAL | Status: DC
  Filled 2011-06-27 (×2): qty 1

## 2011-06-27 MED ORDER — FINASTERIDE 5 MG PO TABS
5.0000 mg | ORAL_TABLET | Freq: Every day | ORAL | Status: DC
  Administered 2011-06-27: 5 mg via ORAL
  Filled 2011-06-27: qty 1

## 2011-06-27 MED ORDER — HYDROCHLOROTHIAZIDE 25 MG PO TABS
25.0000 mg | ORAL_TABLET | Freq: Every day | ORAL | Status: DC
Start: 2011-06-27 — End: 2011-06-27
  Administered 2011-06-27: 25 mg via ORAL
  Filled 2011-06-27: qty 1

## 2011-06-27 MED ORDER — LORATADINE 10 MG PO TABS
10.0000 mg | ORAL_TABLET | Freq: Every day | ORAL | Status: DC
  Administered 2011-06-27: 10 mg via ORAL
  Filled 2011-06-27: qty 1

## 2011-06-27 MED ORDER — VITAMIN C 500 MG PO TABS
500.0000 mg | ORAL_TABLET | Freq: Every day | ORAL | Status: DC
  Administered 2011-06-27: 500 mg via ORAL
  Filled 2011-06-27: qty 1

## 2011-06-27 MED ORDER — BENAZEPRIL HCL 5 MG PO TABS
5.0000 mg | ORAL_TABLET | Freq: Every day | ORAL | Status: DC
  Administered 2011-06-27: 5 mg via ORAL
  Filled 2011-06-27: qty 1

## 2011-06-27 MED ORDER — ACETAMINOPHEN 325 MG PO TABS: 650.0000 mg | ORAL_TABLET | ORAL | Status: DC | PRN

## 2011-06-27 MED ORDER — MELATONIN 10 MG PO TABS: 1.0000 | ORAL_TABLET | Freq: Every evening | ORAL | Status: DC | PRN

## 2011-06-27 MED ORDER — LEVOTHYROXINE SODIUM 125 MCG PO TABS
125.0000 ug | ORAL_TABLET | Freq: Every day | ORAL | Status: DC
  Filled 2011-06-27: qty 1

## 2011-06-27 MED ORDER — ASPIRIN 81 MG PO CHEW: 81.0000 mg | CHEWABLE_TABLET | Freq: Every day | ORAL | Status: DC

## 2011-06-27 MED ORDER — PANTOPRAZOLE SODIUM 40 MG PO TBEC: 40.0000 mg | DELAYED_RELEASE_TABLET | Freq: Every day | ORAL | Status: DC

## 2011-06-27 MED ORDER — POTASSIUM CHLORIDE CRYS ER 10 MEQ PO TBCR
10.0000 meq | EXTENDED_RELEASE_TABLET | Freq: Every day | ORAL | Status: DC
  Administered 2011-06-27: 10 meq via ORAL
  Filled 2011-06-27: qty 1

## 2011-06-27 MED ORDER — SODIUM CHLORIDE 0.9 % IV SOLN: INTRAVENOUS | Status: DC

## 2011-06-27 NOTE — H&P (View-Only) (Signed)
 Patient Name: Brett Matthews Date of Encounter: 06/27/2011 Principal Problem:  *CAD, NATIVE VESSEL Active Problems:  DIABETES MELLITUS, TYPE II, UNCONTROLLED  HYPERTENSION  DYSPNEA  Dyslipidemia   SUBJECTIVE: Pt denies chest pain or SOB  OBJECTIVE Filed Vitals:   06/26/11 1826 06/26/11 2100 06/27/11 0500  BP: 130/77 152/96 133/70  Pulse: 77 69 63  Temp: 97.8 F (36.6 C) 99 F (37.2 C) 98.8 F (37.1 C)  TempSrc: Oral Oral Oral  Resp: 18 22 20  Height: 5' 9" (1.753 m)    Weight: 193 lb 9 oz (87.8 kg)  193 lb 5.5 oz (87.7 kg)  SpO2: 95% 100% 97%    Intake/Output Summary (Last 24 hours) at 06/27/11 0934 Last data filed at 06/27/11 0856  Gross per 24 hour  Intake 126.75 ml  Output    350 ml  Net -223.25 ml   Weight change:  Filed Weights   06/26/11 1826 06/27/11 0500  Weight: 193 lb 9 oz (87.8 kg) 193 lb 5.5 oz (87.7 kg)   PHYSICAL EXAM General: Well developed, well nourished, male in no acute distress. Head: Normocephalic, atraumatic.  Neck: Supple without bruits, JVD not elevated. Lungs:  Resp regular and unlabored. Neuro: Alert and oriented X 3. Moves all extremities spontaneously. Psych: Normal affect.  LABS: CBC: Basename 06/26/11 1919  WBC 5.6  NEUTROABS --  HGB 12.3*  HCT 37.3*  MCV 82.9  PLT 253   INR: Basename 06/27/11 0605  INR 0.97   Basic Metabolic Panel: Basename 06/27/11 0605 06/26/11 1919  NA 140 --  K 3.2* --  CL 101 --  CO2 29 --  GLUCOSE 121* --  BUN 15 --  CREATININE 0.91 0.97  CALCIUM 9.1 --  MG -- --  PHOS -- --   Fasting Lipid Panel: Basename 06/27/11 0605  CHOL 184  HDL 44  LDLCALC 103*  TRIG 186*  CHOLHDL 4.2  LDLDIRECT --   TELE:  SR      ECG:   Radiology/Studies: CXR done at [facility] [place].  Current Medications:    . alfuzosin  10 mg Oral Q breakfast  . amLODipine  10 mg Oral Daily  . aspirin  324 mg Oral Pre-Cath  . aspirin EC  81 mg Oral Daily  . atorvastatin  80 mg Oral q1800  . diazepam  5 mg Oral  On Call  . enoxaparin  40 mg Subcutaneous Q24H  . insulin aspart  0-15 Units Subcutaneous TID WC  . insulin aspart  0-5 Units Subcutaneous QHS  . levothyroxine  137 mcg Oral QAC breakfast  . metoprolol tartrate  25 mg Oral BID  . pantoprazole  40 mg Oral Q1200  . sodium chloride  3 mL Intravenous Q12H  . sodium chloride  3 mL Intravenous Q12H  . zolpidem  5 mg Oral Once      . sodium chloride 75 mL/hr at 06/27/11 0445    ASSESSMENT AND PLAN: Principal Problem:  *CAD, NATIVE VESSEL - abnormal stress test, failed medical mgt, for cath today secondary to worsening symptoms.  Otherwise, continue home Rx, supp K+ and follow labs.  Active Problems:  DIABETES MELLITUS, TYPE II, UNCONTROLLED  HYPERTENSION  DYSPNEA  Dyslipidemia  Hypokalemia  Signed, Rhonda Barrett , PA-C 9:34 AM 06/27/2011   Attending Note:   The patient was seen and examined.  Agree with assessment and plan as noted above.  Pt has had progressive fatigue, dyspnea , and chest tightness.  He had a cath several years ago   that revealed no significant irregularities.   Discussed risks / benefits/ and options re: cath .  He understands and agrees to proceed   Anoop J. Larisha Vencill, Jr., MD, FACC 06/27/2011, 12:52 PM   

## 2011-06-27 NOTE — CV Procedure (Signed)
    Cardiac Cath Note  Brett Matthews [id number] 03/12/1932  Procedure: Left Heart Cardiac Catheterization Note Indications: chest tightness  Procedure Details Consent: Obtained Time Out: Verified patient identification, verified procedure, site/side was marked, verified correct patient position, special equipment/implants available, Radiology Safety Procedures followed,  medications/allergies/relevent history reviewed, required imaging and test results available.  Performed   Medications: Fentanyl: 50 mcg IV Versed: 2 mg IV  The right femoral artery was easily canulated using a modified Seldinger technique.  Hemodynamics:   LV pressure: 91/7  Aortic pressure: 90/53  Angiography   Left Main: The left main has minor luminal irregularities.    Left anterior Descending: The LAD is a moderate sized vessel.  It quickly branches into a large diagonal branch which also branches into 2 moderate sized vessels that supply the 1st and 2nd diagonal distributions.  The "2nd diagonal " vessel reaches around to supply the apex.  The LAD then gives off a large septal branch and terminates before reaching the apex.   Left Circumflex: The LCX is a moderate sized vesse.  There  Is a high marginal that has minor luminal irregularities.  The mid LCx has a moderate  60 % stenosis.  Right Coronary Artery: The RCA is a large and dominate vessel.  There are mild irregularities in the mid and distal vessel.  The PDA and PLSA have minor luminal irregularities.  LV Gram: Normal LV function with EF of 65%  Complications: No apparent complications Patient did tolerate procedure well.  Conclusions:   1. Moderate CAD involving the mid LCx.  This lesion does not appear to obstruct flow and does not appear to be  the cause of his symptoms 2. Normal LV function.  He will be able to go home today.  Vesta Mixer, Montez Hageman., MD, [facility] 06/27/2011, 2:02 PM Office - [phone] Pager [phone]

## 2011-06-27 NOTE — Interval H&P Note (Signed)
History and Physical Interval Note:  06/27/2011 1:20 PM  Brett Matthews  has presented today for surgery, with the diagnosis of Chest pain  The various methods of treatment have been discussed with the patient and family. After consideration of risks, benefits and other options for treatment, the patient has consented to  Procedure(s) (LRB): LEFT HEART CATHETERIZATION WITH CORONARY ANGIOGRAM (N/A) as a surgical intervention .  The patient's history has been reviewed, patient examined, no change in status, stable for surgery.  I have reviewed the patients' chart and labs.  Questions were answered to the patient's satisfaction.     Elyn Aquas.

## 2011-06-27 NOTE — Progress Notes (Signed)
Patient's IV and tele has been discontinued; patient verbalizes understanding discharge information__________________________________________________________________________________________D. Manson Passey RN

## 2011-06-27 NOTE — Discharge Summary (Signed)
CARDIOLOGY DISCHARGE SUMMARY   Patient ID: Brett Matthews MRN: [id number] DOB/AGE: 01-14-1932 78 y.o.  Admit date: 06/26/2011 Discharge date: 06/27/2011  Primary Discharge Diagnosis:  Dyspnea, hx abnormal stress test, non-obstructive CAD at cath. Secondary Discharge Diagnosis:  Past Medical History  Diagnosis Date  . CAD (coronary artery disease)     native vessel  . Gastroesophageal reflux disease     chronic  . Atrial fibrillation   . Neoplasm of lymphatic and hematopoietic tissue   . Hypothyroidism   . Fatigue     Chronic  . Hypertension   . Anemia, iron deficiency   . CAD (coronary artery disease)     nonobstructive, a cardiac catheterization in July 2008  . Cough     Persistent, a. No recent amelioration following the addition of Protonix/discontinuation of ACE inhibitor. b. Status post Nissen fundoplication  . Paroxysmal atrial fibrillation     with rapid ventricular response  . Hypothyroidism 2003    Status post total thyroidectomy  . Hypertension     Normal LV funciton  . H/O hiatal hernia   . Complication of anesthesia     "woke up couple times before they got thru working on me"  . Heart murmur   . Anginal pain   . Exertional dyspnea 06/2011  . Dyspnea     Persistent; a. Normal recent cariopulmonary function test in July 2008. b. Undergoing current ENT evaluation at [facility]. c. Recent negative echocardiogram with "bubble" study form inter-cardiac shunt.  . DM (diabetes mellitus), type 2, uncontrolled   . Diabetes mellitus, type 2   . Migraines     "stopped after they put me on BP medicine"  . Skin cancer   . Arthritis     "all over"    Procedures: Cardiac catheterization, coronary arteriogram, left ventriculogram  Hospital Course: Mr. Mcferran is a 78 year old male with a history of nonobstructive coronary artery disease. He went to [facility] with chest pain. A Lexi scan Myoview showed a small reversible defect with  preserved EF. He was a low-risk study and medical therapy was initially recommended. However, he had recurrent symptoms and his ECG showed nonspecific changes. He was transferred to [facility] for further evaluation and treatment.  His labs had no significant abnormalities and his potassium was supplemented. Cardiac catheterization was recommended and he was taken to the cath lab on 06/27/2011 with the results described below. Medical therapy was recommended. Post-procedure on 06/27/2011, he was ambulating without chest pain or shortness of breath. His cath site had no complications and he is considered stable for discharge, to followup in [place].  Labs:  Lab Results  Component Value Date   WBC 5.6 06/26/2011   HGB 12.3* 06/26/2011   HCT 37.3* 06/26/2011   MCV 82.9 06/26/2011   PLT 253 06/26/2011    Lab 06/27/11 0605  NA 140  K 3.2*  CL 101  CO2 29  BUN 15  CREATININE 0.91  CALCIUM 9.1  PROT --  BILITOT --  ALKPHOS --  ALT --  AST --  GLUCOSE 121*    Lipid Panel     Component Value Date/Time   CHOL 184 06/27/2011 0605   TRIG 186* 06/27/2011 0605   HDL 44 06/27/2011 0605   CHOLHDL 4.2 06/27/2011 0605   VLDL 37 06/27/2011 0605   LDLCALC 103* 06/27/2011 0605    No results found for this basename: probnp    Basename 06/27/11 0605  INR 0.97  Radiology: No results found.  Cardiac Cath: 06/27/2011 Left Main: The left main has minor luminal irregularities.  Left anterior Descending: The LAD is a moderate sized vessel. It quickly branches into a large diagonal branch which also branches into 2 moderate sized vessels that supply the 1st and 2nd diagonal distributions. The "2nd diagonal " vessel reaches around to supply the apex. The LAD then gives off a large septal branch and terminates before reaching the apex.  Left Circumflex: The LCX is a moderate sized vesse. There Is a high marginal that has minor luminal irregularities. The mid LCx has a moderate 60 % stenosis.  Right  Coronary Artery: The RCA is a large and dominate vessel. There are mild irregularities in the mid and distal vessel. The PDA and PLSA have minor luminal irregularities.  LV Gram: Normal LV function with EF of 65%    EKG: 27-Jun-2011 05:11:29  Sinus rhythm with Premature atrial complexes Otherwise normal ECG 27mm/s 67mm/mV 100Hz  8.0.1 12SL 239 CID: 20 Referred by: Duke Salvia Unconfirmed Vent. rate 68 BPM PR interval 164 ms QRS duration 116 ms QT/QTc 416/442 ms P-R-T axes 54 -16 24  FOLLOW UP PLANS AND APPOINTMENTS No Known Allergies Medication List  As of 06/27/2011  4:12 PM   STOP taking these medications         lubiprostone 24 MCG capsule         TAKE these medications         amLODipine 10 MG tablet   Commonly known as: NORVASC   Take 10 mg by mouth daily.      aspirin 81 MG tablet   Take 81 mg by mouth daily.      benazepril 5 MG tablet   Commonly known as: LOTENSIN   Take 5 mg by mouth daily.      CULTURELLE PO   Take 1 tablet by mouth daily.      diclofenac 75 MG EC tablet   Commonly known as: VOLTAREN   Take 1 tablet by mouth Twice daily.      fexofenadine 180 MG tablet   Commonly known as: ALLEGRA   Take 180 mg by mouth daily.      finasteride 5 MG tablet   Commonly known as: PROSCAR   Take 5 mg by mouth daily.      fish oil-omega-3 fatty acids 1000 MG capsule   Take 1 g by mouth daily.      folic acid 1 MG tablet   Commonly known as: FOLVITE   Take 1 mg by mouth daily.      glucosamine-chondroitin 500-400 MG tablet   Take 1 tablet by mouth 2 (two) times daily.      hydrochlorothiazide 25 MG tablet   Commonly known as: HYDRODIURIL   Take 25 mg by mouth daily.      levothyroxine 137 MCG tablet   Commonly known as: SYNTHROID, LEVOTHROID   Take 1 tablet (137 mcg total) by mouth daily before breakfast.      Melatonin 10 MG Tabs   Take 1 tablet by mouth at bedtime as needed. Sleep.      metFORMIN 500 MG tablet   Commonly known as:  GLUCOPHAGE   Take 1 tablet (500 mg total) by mouth 2 (two) times daily with a meal. HOLD 48 hours, restart on 06/30/2011.      metoprolol 50 MG tablet   Commonly known as: LOPRESSOR   Take 25 mg by mouth 2 (two) times daily.  multivitamin tablet   Take 1 tablet by mouth daily.         pantoprazole 40 MG tablet   Commonly known as: PROTONIX   Take 40 mg by mouth daily.      Potassium Gluconate 595 MG Caps   Take 1 capsule by mouth daily.      vitamin C 500 MG tablet   Commonly known as: ASCORBIC ACID   Take 500 mg by mouth daily.            Discharge Orders    Future Appointments: Provider: Department: Dept Phone: Center:   07/07/2011 10:30 AM Rollene Rotunda, MD [facility] [phone] [facility]     Future Orders Please Complete By Expires   Diet - low sodium heart healthy      Diet Carb Modified      Increase activity slowly          BRING ALL MEDICATIONS WITH YOU TO FOLLOW UP APPOINTMENTS  Time spent with patient to include physician time: 41 min Signed: Theodore Demark 06/27/2011, 4:12 PM Co-Sign MD  Attending Note:   The patient was seen and examined.  Agree with assessment and plan as noted above.  See my note from the day of discharge.  Vesta Mixer, Montez Hageman., MD, [facility] 07/05/2011, 9:08 AM

## 2011-06-27 NOTE — Progress Notes (Signed)
Patient's VS are stable, patient alert and oriented x3; vascular site shows no sign of hematoma or bleeding, bandaid has been applied; will continue to monitor patient_______________________________________D. Manson Passey RN

## 2011-06-27 NOTE — Progress Notes (Signed)
Patient Name: Brett Matthews Date of Encounter: 06/27/2011 Principal Problem:  *CAD, NATIVE VESSEL Active Problems:  DIABETES MELLITUS, TYPE II, UNCONTROLLED  HYPERTENSION  DYSPNEA  Dyslipidemia   SUBJECTIVE: Pt denies chest pain or SOB  OBJECTIVE Filed Vitals:   06/26/11 1826 06/26/11 2100 06/27/11 0500  BP: 130/77 152/96 133/70  Pulse: 77 69 63  Temp: 97.8 F (36.6 C) 99 F (37.2 C) 98.8 F (37.1 C)  TempSrc: Oral Oral Oral  Resp: 18 22 20   Height: 5\' 9"  (1.753 m)    Weight: 193 lb 9 oz (87.8 kg)  193 lb 5.5 oz (87.7 kg)  SpO2: 95% 100% 97%    Intake/Output Summary (Last 24 hours) at 06/27/11 0934 Last data filed at 06/27/11 0856  Gross per 24 hour  Intake 126.75 ml  Output    350 ml  Net -223.25 ml   Weight change:  Filed Weights   06/26/11 1826 06/27/11 0500  Weight: 193 lb 9 oz (87.8 kg) 193 lb 5.5 oz (87.7 kg)   PHYSICAL EXAM General: Well developed, well nourished, male in no acute distress. Head: Normocephalic, atraumatic.  Neck: Supple without bruits, JVD not elevated. Lungs:  Resp regular and unlabored. Neuro: Alert and oriented X 3. Moves all extremities spontaneously. Psych: Normal affect.  LABS: CBC: Basename 06/26/11 1919  WBC 5.6  NEUTROABS --  HGB 12.3*  HCT 37.3*  MCV 82.9  PLT 253   INR: Basename 06/27/11 0605  INR 0.97   Basic Metabolic Panel: Basename 06/27/11 0605 06/26/11 1919  NA 140 --  K 3.2* --  CL 101 --  CO2 29 --  GLUCOSE 121* --  BUN 15 --  CREATININE 0.91 0.97  CALCIUM 9.1 --  MG -- --  PHOS -- --   Fasting Lipid Panel: Basename 06/27/11 0605  CHOL 184  HDL 44  LDLCALC 103*  TRIG 186*  CHOLHDL 4.2  LDLDIRECT --   TELE:  SR      ECG:   Radiology/Studies: CXR done at [facility] [place].  Current Medications:    . alfuzosin  10 mg Oral Q breakfast  . amLODipine  10 mg Oral Daily  . aspirin  324 mg Oral Pre-Cath  . aspirin EC  81 mg Oral Daily  . atorvastatin  80 mg Oral q1800  . diazepam  5 mg Oral  On Call  . enoxaparin  40 mg Subcutaneous Q24H  . insulin aspart  0-15 Units Subcutaneous TID WC  . insulin aspart  0-5 Units Subcutaneous QHS  . levothyroxine  137 mcg Oral QAC breakfast  . metoprolol tartrate  25 mg Oral BID  . pantoprazole  40 mg Oral Q1200  . sodium chloride  3 mL Intravenous Q12H  . sodium chloride  3 mL Intravenous Q12H  . zolpidem  5 mg Oral Once      . sodium chloride 75 mL/hr at 06/27/11 0445    ASSESSMENT AND PLAN: Principal Problem:  *CAD, NATIVE VESSEL - abnormal stress test, failed medical mgt, for cath today secondary to worsening symptoms.  Otherwise, continue home Rx, supp K+ and follow labs.  Active Problems:  DIABETES MELLITUS, TYPE II, UNCONTROLLED  HYPERTENSION  DYSPNEA  Dyslipidemia  Hypokalemia  Signed, Theodore Demark , PA-C 9:34 AM 06/27/2011   Attending Note:   The patient was seen and examined.  Agree with assessment and plan as noted above.  Pt has had progressive fatigue, dyspnea , and chest tightness.  He had a cath several years ago  that revealed no significant irregularities.   Discussed risks / benefits/ and options re: cath .  He understands and agrees to proceed   Alvia Grove., MD, [facility] 06/27/2011, 12:52 PM

## 2011-06-27 NOTE — Discharge Instructions (Signed)
PLEASE REMEMBER TO BRING ALL OF YOUR MEDICATIONS TO EACH OF YOUR FOLLOW-UP OFFICE VISITS.  PLEASE ATTEND ALL SCHEDULED FOLLOW-UP APPOINTMENTS.   Activity: Increase activity slowly as tolerated. You may shower, but no soaking baths (or swimming) for 1 week. No driving for 2 days. No lifting over 5 lbs for 1 week. No sexual activity for 1 week.   You May Return to Work: in 1 week (if applicable)  Wound Care: You may wash cath site gently with soap and water. Keep cath site clean and dry. If you notice pain, swelling, bleeding or pus at your cath site, please call [phone].    Groin Site Care Refer to this sheet in the next few weeks. These instructions provide you with information on caring for yourself after your procedure. Your caregiver may also give you more specific instructions. Your treatment has been planned according to current medical practices, but problems sometimes occur. Call your caregiver if you have any problems or questions after your procedure. HOME CARE INSTRUCTIONS  You may shower 24 hours after the procedure. Remove the bandage (dressing) and gently wash the site with plain soap and water. Gently pat the site dry.   Do not apply powder or lotion to the site.   Do not sit in a bathtub, swimming pool, or whirlpool for 5 to 7 days.   No bending, squatting, or lifting anything over 10 pounds (4.5 kg) as directed by your caregiver.   Inspect the site at least twice daily.   Do not drive home if you are discharged the same day of the procedure. Have someone else drive you.   You may drive 24 hours after the procedure unless otherwise instructed by your caregiver.  What to expect:  Any bruising will usually fade within 1 to 2 weeks.   Blood that collects in the tissue (hematoma) may be painful to the touch. It should usually decrease in size and tenderness within 1 to 2 weeks.  SEEK IMMEDIATE MEDICAL CARE IF:  You have unusual pain at the groin site or down the  affected leg.   You have redness, warmth, swelling, or pain at the groin site.   You have drainage (other than a small amount of blood on the dressing).   You have chills.   You have a fever or persistent symptoms for more than 72 hours.   You have a fever and your symptoms suddenly get worse.   Your leg becomes pale, cool, tingly, or numb.   You have heavy bleeding from the site. Hold pressure on the site.  Document Released: 01/25/2010 Document Revised: 12/12/2010 Document Reviewed: 01/25/2010 [employer] Patient Information 2012 [employer].  

## 2011-06-28 LAB — HEMOGLOBIN A1C: Mean Plasma Glucose: 154 mg/dL — ABNORMAL HIGH (ref <117)

## 2011-07-02 ENCOUNTER — Telehealth: Payer: Self-pay | Admitting: *Deleted

## 2011-07-02 DIAGNOSIS — Z79899 Other long term (current) drug therapy: Secondary | ICD-10-CM

## 2011-07-02 DIAGNOSIS — E876 Hypokalemia: Secondary | ICD-10-CM

## 2011-07-02 NOTE — Telephone Encounter (Signed)
Notes Recorded by Lesle Chris, LPN on 9/56/2130 at 1:59 PM Patient notified and verbalized understanding. He will go tomorrow for lab at [facility]. Will fax order. Has OV with Dr. Antoine Poche on 7/1.

## 2011-07-02 NOTE — Telephone Encounter (Signed)
Message copied by Lesle Chris on Wed Jul 02, 2011  2:00 PM ------      Message from: Lewayne Bunting E      Created: Wed Jul 02, 2011  8:23 AM       Schedule for lab work in the next couple of days : BMET            ----- Message -----         From: Vesta Mixer, MD         Sent: 06/30/2011   6:31 PM           To: June Leap, MD, Duke Salvia, MD            Michelle Piper,            The low potassium was supplimented before cath but it looks like he went home on the same doe he came in on.  He probably needs to double it and be rechecked in a week or so.            Phil            ----- Message -----         From: Duke Salvia, MD         Sent: 06/27/2011   5:23 PM           To: June Leap, MD, Jefferey Pica, RN, #            Emeline General was low  And this ended up in my box, Phil cathed pt    I cant tell from discharge summary that this was addressedThanks

## 2011-07-02 NOTE — Progress Notes (Signed)
Utilization review completed.  

## 2011-07-04 MED ORDER — POTASSIUM CHLORIDE ER 10 MEQ PO TBCR: 20.0000 meq | EXTENDED_RELEASE_TABLET | Freq: Every day | ORAL | Status: DC

## 2011-07-04 NOTE — Addendum Note (Signed)
Addended by: Lesle Chris on: 07/04/2011 05:30 PM   Modules accepted: Orders

## 2011-07-04 NOTE — Telephone Encounter (Signed)
Call placed to patient to see if he has done BMET yet as previously disussed.  Has OV on Monday, July 1 with Hochrein.  Stated he had not done yet because Dr. Sherril Croon had done some labs on him a few weeks ago & he was not sure.  Stated he got a call from Dr. Sherril Croon office wanting him to take Potassium , but stated it was in his car and he didn't know if he should start medication.  Advised patient to do labs this evening as these numbers could change on any given day.  Will await results & call patient back this evening with results.  He verbalized understanding.

## 2011-07-04 NOTE — Telephone Encounter (Addendum)
BMET received.  Potassium - 3.2.  Per [name], PA - take Potassium tonight (can take 2 tabs now & 2 tabs 4-5 hours later), then daily till OV on Monday.  Patient can use medication that he already has.  Repeat BMET & Magnesium level Monday, July 1 - prior to OV.    Patient notified of above & vebalized understanding.

## 2011-07-07 ENCOUNTER — Ambulatory Visit (INDEPENDENT_AMBULATORY_CARE_PROVIDER_SITE_OTHER): Payer: Medicare Other | Admitting: Cardiology

## 2011-07-07 ENCOUNTER — Encounter: Payer: Self-pay | Admitting: Cardiology

## 2011-07-07 VITALS — BP 119/76 | HR 67 | Ht 70.0 in | Wt 200.0 lb

## 2011-07-07 DIAGNOSIS — I2510 Atherosclerotic heart disease of native coronary artery without angina pectoris: Secondary | ICD-10-CM

## 2011-07-07 DIAGNOSIS — I10 Essential (primary) hypertension: Secondary | ICD-10-CM

## 2011-07-07 NOTE — Patient Instructions (Addendum)
Your physician recommends that you schedule a follow-up appointment in: 4 months with Dr. Andee Lineman. You will receive a reminder letter in the mail in about 2 months reminding you to call and schedule your appointment. If you don't receive this letter, please contact our office.  Your physician has recommended you make the following change in your medication:stop taking the following medications. Fish oil Vitamin C Culturelle Multivitamin All other medications will remain the same.

## 2011-07-07 NOTE — Assessment & Plan Note (Signed)
The blood pressure is at target. No change in medications is indicated. We will continue with therapeutic lifestyle changes (TLC).  

## 2011-07-07 NOTE — Progress Notes (Signed)
HPI The patient presents for followup after recent cardiac catheterization. He had chest discomfort was seen in the emergency room. He subsequently had a stress perfusion study which suggested reversible defect in the mid inferior lateral and anterolateral segments. He subsequently was sent for catheterization the nose, which demonstrated 60% mid circumflex stenosis but was otherwise free of disease. He was found to have a low potassium and was managed for this. He thinks that was a lot of his problem. He was having leg cramping as well as chest cramping. Since that time he's had no further chest discomfort, neck or arm discomfort. He's had no shortness of breath, PND or orthopnea. He did have blood work done today for his potassium and that result is pending.  No Known Allergies  Current Outpatient Prescriptions  Medication Sig Dispense Refill  . amLODipine (NORVASC) 10 MG tablet Take 10 mg by mouth daily.        Marland Kitchen aspirin 81 MG tablet Take 81 mg by mouth daily.        . benazepril (LOTENSIN) 5 MG tablet Take 5 mg by mouth daily.      . diclofenac (VOLTAREN) 75 MG EC tablet Take 1 tablet by mouth Twice daily.      . fexofenadine (ALLEGRA) 180 MG tablet Take 180 mg by mouth daily.      . finasteride (PROSCAR) 5 MG tablet Take 5 mg by mouth daily.        . fish oil-omega-3 fatty acids 1000 MG capsule Take 1 g by mouth daily.        . folic acid (FOLVITE) 1 MG tablet Take 1 mg by mouth daily.        Marland Kitchen glucosamine-chondroitin 500-400 MG tablet Take 1 tablet by mouth 2 (two) times daily.        . hydrochlorothiazide (HYDRODIURIL) 25 MG tablet Take 25 mg by mouth daily.      . Lactobacillus Rhamnosus, GG, (CULTURELLE PO) Take 1 tablet by mouth daily.      Marland Kitchen levothyroxine (SYNTHROID, LEVOTHROID) 137 MCG tablet Take 1 tablet (137 mcg total) by mouth daily before breakfast.      . Melatonin 10 MG TABS Take 1 tablet by mouth at bedtime as needed. Sleep.      . metFORMIN (GLUCOPHAGE) 500 MG tablet Take  1 tablet (500 mg total) by mouth 2 (two) times daily with a meal. HOLD 48 hours, restart on 06/30/2011.      . metoprolol (LOPRESSOR) 50 MG tablet Take 25 mg by mouth 2 (two) times daily.      . Multiple Vitamin (MULTIVITAMIN) tablet Take 1 tablet by mouth daily.        . nitroGLYCERIN (NITROSTAT) 0.4 MG SL tablet Place 0.4 mg under the tongue every 5 (five) minutes as needed. As needed for chest pain.      . pantoprazole (PROTONIX) 40 MG tablet Take 40 mg by mouth daily.        . potassium chloride (K-DUR) 10 MEQ tablet Take 2 tablets (20 mEq total) by mouth daily.      . Potassium Gluconate 595 MG CAPS Take 1 capsule by mouth daily.        . vitamin C (ASCORBIC ACID) 500 MG tablet Take 500 mg by mouth daily.        Past Medical History  Diagnosis Date  . CAD (coronary artery disease)     native vessel  . Gastroesophageal reflux disease     chronic  .  Atrial fibrillation   . Neoplasm of lymphatic and hematopoietic tissue   . Hypothyroidism   . Fatigue     Chronic  . Hypertension   . Anemia, iron deficiency   . CAD (coronary artery disease)     nonobstructive, a cardiac catheterization in July 2008  . Cough     Persistent, a. No recent amelioration following the addition of Protonix/discontinuation of ACE inhibitor. b. Status post Nissen fundoplication  . Paroxysmal atrial fibrillation     with rapid ventricular response  . Hypothyroidism 2003    Status post total thyroidectomy  . Hypertension     Normal LV funciton  . H/O hiatal hernia   . Complication of anesthesia     "woke up couple times before they got thru working on me"  . Heart murmur   . Anginal pain   . Exertional dyspnea 06/2011  . Dyspnea     Persistent; a. Normal recent cariopulmonary function test in July 2008. b. Undergoing current E[facility] evaluation at [facility]. c. Recent negative echocardiogram with "bubble" study form inter-cardiac shunt.  . DM (diabetes mellitus), type 2, uncontrolled     . Diabetes mellitus, type 2   . Migraines     "stopped after they put me on BP medicine"  . Skin cancer   . Arthritis     "all over"    Past Surgical History  Procedure Date  . Nissen fundoplication   . Bone marrow biopsy 1990's  . Back surgery   . Posterior laminectomy / decompression lumbar spine ~ 1990's  . Skin cancer excision     "off my back and chest; from sun"  . Cardiac catheterization     ROS:  As stated in the HPI and negative for all other systems.  PHYSICAL EXAM BP 119/76  Pulse 67  Ht 5\' 10"  (1.778 m)  Wt 200 lb (90.719 kg)  BMI 28.70 kg/m2 GENERAL:  Well appearing NECK:  No jugular venous distention, waveform within normal limits, carotid upstroke brisk and symmetric, no bruits, no thyromegaly LUNGS:  Clear to auscultation bilaterally BACK:  No CVA tenderness CHEST:  Unremarkable HEART:  PMI not displaced or sustained,S1 and S2 within normal limits, no S3, no S4, no clicks, no rubs, no murmurs ABD:  Flat, positive bowel sounds normal in frequency in pitch, no bruits, no rebound, no guarding, no midline pulsatile mass, no hepatomegaly, no splenomegaly EXT:  2 plus pulses throughout, no edema   ASSESSMENT AND PLAN

## 2011-07-07 NOTE — Assessment & Plan Note (Signed)
He has nonobstructive coronary disease. No further cardiac workup is suggested. I will follow the results of his basic metabolic profile today and supplement potassium further as needed.

## 2011-07-09 ENCOUNTER — Telehealth: Payer: Self-pay | Admitting: *Deleted

## 2011-07-09 NOTE — Telephone Encounter (Signed)
Message copied by Lesle Chris on Wed Jul 09, 2011 12:30 PM ------      Message from: Prescott Parma C      Created: Wed Jul 09, 2011  8:32 AM       NL electrolytes

## 2011-07-09 NOTE — Telephone Encounter (Signed)
Patient walked into office inquiring about lab results.    Notes Recorded by Lesle Chris, LPN on 01/11/1094 at 12:30 PM Patient notified and verbalized understanding.

## 2011-09-12 ENCOUNTER — Ambulatory Visit: Payer: Medicare Other | Admitting: Cardiology

## 2011-09-15 ENCOUNTER — Encounter: Payer: Self-pay | Admitting: Internal Medicine

## 2011-09-15 ENCOUNTER — Ambulatory Visit (INDEPENDENT_AMBULATORY_CARE_PROVIDER_SITE_OTHER): Payer: Medicare Other | Admitting: Internal Medicine

## 2011-09-15 VITALS — BP 140/80 | HR 80 | Ht 69.0 in | Wt 201.8 lb

## 2011-09-15 DIAGNOSIS — I10 Essential (primary) hypertension: Secondary | ICD-10-CM

## 2011-09-15 DIAGNOSIS — I4891 Unspecified atrial fibrillation: Secondary | ICD-10-CM

## 2011-09-15 MED ORDER — MAGNESIUM 100 MG PO CAPS: 1.0000 | ORAL_CAPSULE | Freq: Every day | ORAL | Status: DC

## 2011-09-15 NOTE — Assessment & Plan Note (Signed)
The patient carries a diagnosis of atrial fibrillation with sounds like it's right even though we don't have ECG corroboration available in epic. He has a CHADS-VASc score of 4 and as such would be appropriately treated with a NOAC/Coumadin. He does have some fluttering. We will utilize a 30 day event recorder to try and clarify ongoing atrial fibrillation so as to support the recommendation of oral anticoagulation. We have reviewed the potential issues of stroke.

## 2011-09-15 NOTE — Patient Instructions (Signed)
   Stop Potassium  Lab:  BMET - due in 2 weeks (around 9/23)  30 day heart monitor  Office will contact with results  Follow up with Dr. Johney Frame

## 2011-09-15 NOTE — Assessment & Plan Note (Signed)
The patient has hypertension. He had hypokalemia in the context of diuretic therapy. His potassium was last checked on diuretics and supplementation was normal. We will stop his potassium today. We will recheck his metabolic profile in 2-3 weeks. If in fact he has hypokalemia hyperaldosteronism will need to be excluded. We will also put him on magnesium supplementation to help with his leg cramps

## 2011-09-15 NOTE — Progress Notes (Signed)
kf Patient Care Team: Ignatius Specking, MD as PCP - General (Internal Medicine)   HPI  Brett Matthews is a 78 y.o. male Seen in followup for recent cardiac catheterization undertaken in June 2013 that demonstrated only a 50% mid circumflex otherwise is free of disease. He was found to have a low potassium. Hydrochlorothiazide has been just recently stopped. He was on potassium supplementation prior to its been discontinued. His blood pressure has been being followed and is in the 140 range today.  He also has a history of paroxysmal atrial fibrillation and hypertension. He has been managed with aspirin anticoagulation.  Thromboembolic risk factors are notable for hypertension-1, age-9, diabetes-1. He apparently was on Coumadin years ago but does not remember why it was stopped.   his atrial fibrillation occurred in the context of having gotten dehydrated at work. I was on able to find an electrocardiogram confirming the diagnosis of his story certainly strongly supports the diagnosis.  He has occasional intermittent palpitations.       Past Medical History  Diagnosis Date  . Gastroesophageal reflux disease     chronic  . Atrial fibrillation   . Neoplasm of lymphatic and hematopoietic tissue   . Fatigue     Chronic  . Hypertension   . Anemia, iron deficiency   . CAD (coronary artery disease)     60 % circ stenosis Cath 6/13  . Cough     Persistent, a. No recent amelioration following the addition of Protonix/discontinuation of ACE inhibitor. b. Status post Nissen fundoplication  . Hypothyroidism 2003    Status post total thyroidectomy  . Complication of anesthesia     "woke up couple times before they got thru working on me"  . Heart murmur   . Dyspnea     Persistent; a. Normal recent cariopulmonary function test in July 2008. b. Undergoing current E[facility] evaluation at [facility]. c. Recent negative echocardiogram with "bubble" study form inter-cardiac shunt.    . DM (diabetes mellitus), type 2, uncontrolled   . Migraines     "stopped after they put me on BP medicine"  . Skin cancer   . Arthritis     "all over"    Past Surgical History  Procedure Date  . Nissen fundoplication   . Bone marrow biopsy 1990's  . Back surgery   . Posterior laminectomy / decompression lumbar spine ~ 1990's  . Skin cancer excision     "off my back and chest; from sun"  . Cardiac catheterization     Current Outpatient Prescriptions  Medication Sig Dispense Refill  . amLODipine (NORVASC) 10 MG tablet Take 10 mg by mouth daily.        Marland Kitchen aspirin 81 MG tablet Take 81 mg by mouth daily.        . benazepril (LOTENSIN) 5 MG tablet Take 5 mg by mouth daily.      . diclofenac (VOLTAREN) 75 MG EC tablet Take 1 tablet by mouth Twice daily.      . fexofenadine (ALLEGRA) 180 MG tablet Take 180 mg by mouth daily.      . finasteride (PROSCAR) 5 MG tablet Take 5 mg by mouth daily.        . fish oil-omega-3 fatty acids 1000 MG capsule Take 1 g by mouth daily.      . folic acid (FOLVITE) 1 MG tablet Take 1 mg by mouth daily.        Marland Kitchen glucosamine-chondroitin 500-400 MG tablet  Take 1 tablet by mouth 2 (two) times daily.        . Lactobacillus Rhamnosus, GG, (CULTURELLE PO) Take 1 tablet by mouth daily.      . Melatonin 10 MG TABS Take 1 tablet by mouth at bedtime as needed. Sleep.      . metoprolol (LOPRESSOR) 50 MG tablet Take 25 mg by mouth 2 (two) times daily.      . Multiple Vitamin (MULTIVITAMIN) tablet Take 1 tablet by mouth daily.      . nitroGLYCERIN (NITROSTAT) 0.4 MG SL tablet Place 0.4 mg under the tongue every 5 (five) minutes as needed. As needed for chest pain.      . pantoprazole (PROTONIX) 40 MG tablet Take 40 mg by mouth daily.        . potassium chloride (K-DUR) 10 MEQ tablet Take 2 tablets (20 mEq total) by mouth daily.      . Potassium Gluconate 595 MG CAPS Take 1 capsule by mouth daily.        . vitamin C (ASCORBIC ACID) 500 MG tablet Take 500 mg by mouth  daily.      Marland Kitchen levothyroxine (SYNTHROID, LEVOTHROID) 137 MCG tablet Take 1 tablet (137 mcg total) by mouth daily before breakfast.      . metFORMIN (GLUCOPHAGE) 500 MG tablet Take 1 tablet (500 mg total) by mouth 2 (two) times daily with a meal. HOLD 48 hours, restart on 06/30/2011.        No Known Allergies  Review of Systems negative except from HPI and PMH  Physical Exam BP 140/80  Pulse 80  Ht 5\' 9"  (1.753 m)  Wt 201 lb 12.8 oz (91.536 kg)  BMI 29.80 kg/m2 Well developed and well nourished in no acute distress HENT normal hearing aids in place E scleral and icterus clear Neck Supple JVP flat; carotids brisk and full Clear to ausculation *Regular rate and rhythm, +S4 Soft c No clubbing cyanosis none Edema Alert and oriented, grossly normal motor and sensory function Skin Warm and Dry    Assessment and  Plan

## 2011-09-15 NOTE — Addendum Note (Signed)
Addended by: Lesle Chris on: 09/15/2011 03:56 PM   Modules accepted: Orders

## 2011-09-16 ENCOUNTER — Other Ambulatory Visit: Payer: Self-pay | Admitting: *Deleted

## 2011-09-16 DIAGNOSIS — I4891 Unspecified atrial fibrillation: Secondary | ICD-10-CM

## 2011-09-18 ENCOUNTER — Other Ambulatory Visit: Payer: Self-pay | Admitting: Cardiology

## 2011-09-30 ENCOUNTER — Telehealth: Payer: Self-pay | Admitting: *Deleted

## 2011-09-30 DIAGNOSIS — I4891 Unspecified atrial fibrillation: Secondary | ICD-10-CM

## 2011-09-30 NOTE — Telephone Encounter (Signed)
Spoke with patient in r/e why he hasn't placed event monitor on yet and he said that he was reluctant to doing it and procrastinating because of his job. Nurse explained importance of wearing monitor and that MD needed results to treat symptoms and diagnosis appropriately. Patient verbalized understanding and said he would place monitor.

## 2011-10-24 ENCOUNTER — Encounter: Payer: Self-pay | Admitting: Internal Medicine

## 2011-10-24 ENCOUNTER — Ambulatory Visit (INDEPENDENT_AMBULATORY_CARE_PROVIDER_SITE_OTHER): Payer: Medicare Other | Admitting: Internal Medicine

## 2011-10-24 VITALS — BP 132/91 | HR 77 | Ht 69.0 in | Wt 200.4 lb

## 2011-10-24 DIAGNOSIS — I10 Essential (primary) hypertension: Secondary | ICD-10-CM

## 2011-10-24 DIAGNOSIS — I4891 Unspecified atrial fibrillation: Secondary | ICD-10-CM

## 2011-10-24 NOTE — Patient Instructions (Addendum)
Your physician recommends that you schedule a follow-up appointment in: 2 weeks in [place] with [name],PA and reestablish with a Cardiologist in [place] office  Has see Dr Antoine Poche before  Continue to wear monitor   Your physician has recommended you make the following change in your medication:  1) Stop Metoprolol   Keep log of Blood pressures

## 2011-10-26 ENCOUNTER — Encounter: Payer: Self-pay | Admitting: Internal Medicine

## 2011-10-26 NOTE — Assessment & Plan Note (Addendum)
He reports fatigue with metoprolol.  I will therefore stop this medicine today. He will return for BP follow-up and event monitor review (as above) in 2-3 weeks  I will see as needed going forward

## 2011-10-26 NOTE — Progress Notes (Signed)
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  He recently was seen by Dr Graciela Husbands in [place].  Though he carries a history of atrial fibrillation, this was felt to not be well documented.  As he has occasional palpitations, he had an event monitor placed by Dr Graciela Husbands.  He continues to wear the monitor.  He presents today for scheduled follow-up.   He reports occasional fatigue. Presently, he is doing very well.  Today, he denies symptoms of  chest pain, shortness of breath,  lower extremity edema, dizziness, presyncope, or syncope.  The patient is otherwise without complaint today.   Past Medical History  Diagnosis Date  . Gastroesophageal reflux disease     chronic  . Atrial fibrillation   . Neoplasm of lymphatic and hematopoietic tissue   . Fatigue     Chronic  . Hypertension   . Anemia, iron deficiency   . CAD (coronary artery disease)     60 % circ stenosis Cath 6/13  . Cough     Persistent, a. No recent amelioration following the addition of Protonix/discontinuation of ACE inhibitor. b. Status post Nissen fundoplication  . Hypothyroidism 2003    Status post total thyroidectomy  . Complication of anesthesia     "woke up couple times before they got thru working on me"  . Heart murmur   . Dyspnea     Persistent; a. Normal recent cariopulmonary function test in July 2008. b. Undergoing current ENT evaluation at [facility]. c. Recent negative echocardiogram with "bubble" study form inter-cardiac shunt.  . DM (diabetes mellitus), type 2, uncontrolled   . Migraines     "stopped after they put me on BP medicine"  . Skin cancer   . Arthritis     "all over"   Past Surgical History  Procedure Date  . Nissen fundoplication   . Bone marrow biopsy 1990's  . Back surgery   . Posterior laminectomy / decompression lumbar spine ~ 1990's  . Skin cancer excision     "off my  back and chest; from sun"  . Cardiac catheterization     Current Outpatient Prescriptions  Medication Sig Dispense Refill  . amLODipine (NORVASC) 10 MG tablet Take 10 mg by mouth daily.        Marland Kitchen aspirin 81 MG tablet Take 81 mg by mouth daily.        . benazepril (LOTENSIN) 5 MG tablet Take 5 mg by mouth daily.      . diclofenac (VOLTAREN) 75 MG EC tablet Take 1 tablet by mouth Twice daily.      . fexofenadine (ALLEGRA) 180 MG tablet Take 180 mg by mouth daily.      . finasteride (PROSCAR) 5 MG tablet Take 5 mg by mouth daily.        . fish oil-omega-3 fatty acids 1000 MG capsule Take 1 g by mouth daily.      . folic acid (FOLVITE) 1 MG tablet Take 1 mg by mouth daily.        Marland Kitchen glucosamine-chondroitin 500-400 MG tablet Take 1 tablet by mouth 2 (two) times daily.        . Lactobacillus Rhamnosus, GG, (CULTURELLE PO) Take 1 tablet by mouth daily.      Marland Kitchen levothyroxine (SYNTHROID, LEVOTHROID) 137 MCG tablet Take 1 tablet (137 mcg total) by mouth daily before breakfast.      .  Magnesium 100 MG CAPS Take 1 capsule (100 mg total) by mouth daily.    0  . Melatonin 10 MG TABS Take 1 tablet by mouth at bedtime as needed. Sleep.      . metFORMIN (GLUCOPHAGE) 500 MG tablet Take 1 tablet (500 mg total) by mouth 2 (two) times daily with a meal. HOLD 48 hours, restart on 06/30/2011.      . Multiple Vitamin (MULTIVITAMIN) tablet Take 1 tablet by mouth daily.      . nitroGLYCERIN (NITROSTAT) 0.4 MG SL tablet Place 0.4 mg under the tongue every 5 (five) minutes as needed. As needed for chest pain.      . pantoprazole (PROTONIX) 40 MG tablet Take 40 mg by mouth daily.        . Potassium Gluconate 595 MG CAPS Take 1 capsule by mouth daily.        . vitamin C (ASCORBIC ACID) 500 MG tablet Take 500 mg by mouth daily.        Physical Exam: Filed Vitals:   10/24/11 1202  BP: 132/91  Pulse: 77  Height: 5\' 9"  (1.753 m)  Weight: 200 lb 6.4 oz (90.901 kg)    GEN- The patient is well appearing, alert and  oriented x 3 today.   Head- normocephalic, atraumatic Eyes-  Sclera clear, conjunctiva pink Ears- hearing intact Oropharynx- clear Lungs- Clear to ausculation bilaterally, normal work of breathing Heart- Regular rate and rhythm, no murmurs, rubs or gallops, PMI not laterally displaced GI- soft, NT, ND, + BS Extremities- no clubbing, cyanosis, or edema  Event monitor- reviewed (see below)   Assessment and Plan:

## 2011-10-26 NOTE — Assessment & Plan Note (Signed)
Though the patient carries a diagnosis of atrial fibrillation, this was recently felt by Dr Graciela Husbands to be not well documented.  The patient is wearing an event monitor.  I have reviewed available tracings which reveal sinus with PACs.  I do not see afib documented. At this point, we will not initiate anticoagulation.  If he has atrial fibrillation documented in the future, then he will certainly need anticoagulation with either coumadin or a novel anticoagulant. I will schedule follow-up with our general cardiology team in [place] in the next few weeks.  At that time, his full event monitor can be reviewed to see if he has afib.  I do not anticipate that he will require EP follow-up going forward.

## 2011-10-29 ENCOUNTER — Telehealth: Payer: Self-pay | Admitting: Internal Medicine

## 2011-10-29 NOTE — Telephone Encounter (Signed)
Pt would like results of blood work @ [phone]

## 2011-10-29 NOTE — Telephone Encounter (Signed)
Labs scanned in sent to Dr. Graciela Husbands for review.

## 2011-10-29 NOTE — Telephone Encounter (Signed)
Patient says Dr Graciela Husbands sent him to lab at [facility] over a month ago for a BMP because he stopped he Potassium and he has not heard anything from the results  The labs are not scanned in.  Will send to [facility] office to see if the can get labs and call patient

## 2011-10-31 NOTE — Telephone Encounter (Signed)
Patient notified of normal labs.   

## 2011-11-05 ENCOUNTER — Telehealth: Payer: Self-pay | Admitting: *Deleted

## 2011-11-05 MED ORDER — METOPROLOL TARTRATE 50 MG PO TABS: 25.0000 mg | ORAL_TABLET | Freq: Two times a day (BID) | ORAL | Status: DC

## 2011-11-05 MED ORDER — ASPIRIN EC 325 MG PO TBEC: 325.0000 mg | DELAYED_RELEASE_TABLET | Freq: Every day | ORAL | Status: DC

## 2011-11-05 NOTE — Telephone Encounter (Signed)
E-cardio heart monitor results - per [name], PA - NSR with intermittent PAF, need to discuss Coumadin at OV.  Arrange early follow up & increase Aspirin to 325mg  daily.    Patient notified of above.  Will increase ASA from 81mg  daily to 325mg  daily.  Already has follow up scheduled for 11/7 with Gene.

## 2011-11-05 NOTE — Addendum Note (Signed)
Addended by: Lesle Chris on: 11/05/2011 02:38 PM   Modules accepted: Orders

## 2011-11-05 NOTE — Telephone Encounter (Signed)
Patient also states that he had to restart his Metoprolol that Dr. Graciela Husbands recently stopped due to fatigue.  States his BP was going up to high & making head feel crazy.  Restarted approx 2-3 days after stopping on 10/18.

## 2011-11-13 ENCOUNTER — Ambulatory Visit (INDEPENDENT_AMBULATORY_CARE_PROVIDER_SITE_OTHER): Payer: Medicare Other | Admitting: Physician Assistant

## 2011-11-13 ENCOUNTER — Encounter: Payer: Self-pay | Admitting: Physician Assistant

## 2011-11-13 VITALS — BP 128/88 | HR 88 | Ht 70.0 in | Wt 196.8 lb

## 2011-11-13 DIAGNOSIS — I10 Essential (primary) hypertension: Secondary | ICD-10-CM

## 2011-11-13 DIAGNOSIS — I2510 Atherosclerotic heart disease of native coronary artery without angina pectoris: Secondary | ICD-10-CM

## 2011-11-13 DIAGNOSIS — I4891 Unspecified atrial fibrillation: Secondary | ICD-10-CM

## 2011-11-13 MED ORDER — ATORVASTATIN CALCIUM 40 MG PO TABS: 40.0000 mg | ORAL_TABLET | Freq: Every day | ORAL | Status: DC

## 2011-11-13 NOTE — Patient Instructions (Signed)
   Patient to consider Coumadin, Xarelto, or Eliquis - see info given  Begin Lipitor 40mg  every evening  Labs - due in 3 months for fasting lipid and liver panel - will send reminder in mail Follow up in  3 months

## 2011-11-13 NOTE — Assessment & Plan Note (Signed)
Well-controlled on current medication regimen 

## 2011-11-13 NOTE — Assessment & Plan Note (Signed)
Followed by primary M.D. 

## 2011-11-13 NOTE — Assessment & Plan Note (Addendum)
Quiescent on current medication regimen. We'll initiate statin therapy with Lipitor 40 daily. Followup lipid profile in 12 weeks. Aggressive management recommended with target LDL 70 or less, if feasible.

## 2011-11-13 NOTE — Assessment & Plan Note (Signed)
Results of recent event monitor were reviewed with Dr. Diona Browner, who noted evidence of possible transient AF. Clinically, patient denied any associated palpitations during this time frame. Given his prior diagnoses of PAF in 2005, for which he was briefly treated with Coumadin, current recommendation is to resume anticoagulation with either Coumadin or one of the newer agents. We have assessed him with a CHADS2 score of 3 (HTN, DM, age), and CHADSVASC of 4. Patient is not currently inclined to make a final decision; therefore, I have instructed him to research the risk/benefits of being on Coumadin or one of the newer agents, versus ASA alone. He he is to then contact our office with his final decision. In the meanwhile, he is to continue on full dose aspirin.

## 2011-11-13 NOTE — Progress Notes (Signed)
Primary Cardiologist: Rollene Rotunda, MD   HPI: Patient presents in followup of recent 30 day event monitor, ordered by Dr. Graciela Husbands, for definitive exclusion of atrial fibrillation. Dr. Graciela Husbands noted that patient carried a diagnosis of such, despite corroborating evidence by available EKGs. He assessed him with a CHADS-VASc score of 4, and recommended initiation of anticoagulation therapy, if there were definite evidence of AF on continuous monitoring.  Clinically, he essentially denies any palpitations. He does complain of occasional weakness and lethargy, and was taken off the Toprol, by Dr. Johney Frame. Patient states that he that he only stopped this medication for 3 days, because this blood pressure became uncontrolled. He noted no significant effect on his diminished energy, while this medication.  The results of the recent event monitor where reviewed in full with the patient. He notes that this is the first time he has worn an event monitor. He denied any palpitations while wearing the device. He reports having been diagnosed with "flutter" years ago, and was briefly on Coumadin.  12-lead EKG today, reviewed by me, indicates NSR 72 bpm; LAD; nonspecific ST changes.  No Known Allergies  Current Outpatient Prescriptions  Medication Sig Dispense Refill  . amLODipine (NORVASC) 10 MG tablet Take 10 mg by mouth daily.        Marland Kitchen aspirin EC 325 MG tablet Take 1 tablet (325 mg total) by mouth daily.      . benazepril (LOTENSIN) 5 MG tablet Take 5 mg by mouth daily.      . diclofenac (VOLTAREN) 75 MG EC tablet Take 1 tablet by mouth Twice daily.      . fexofenadine (ALLEGRA) 180 MG tablet Take 180 mg by mouth daily.      . finasteride (PROSCAR) 5 MG tablet Take 5 mg by mouth daily.        . folic acid (FOLVITE) 1 MG tablet Take 1 mg by mouth daily.        Marland Kitchen glucosamine-chondroitin 500-400 MG tablet Take 1 tablet by mouth 2 (two) times daily.        Marland Kitchen levothyroxine (SYNTHROID, LEVOTHROID) 137 MCG tablet  Take 1 tablet (137 mcg total) by mouth daily before breakfast.      . Magnesium 250 MG TABS Take 1 tablet by mouth daily.      . Melatonin 10 MG TABS Take 1 tablet by mouth at bedtime as needed. Sleep.      . metFORMIN (GLUCOPHAGE) 500 MG tablet Take 1 tablet (500 mg total) by mouth 2 (two) times daily with a meal. HOLD 48 hours, restart on 06/30/2011.      . metoprolol (LOPRESSOR) 50 MG tablet Take 0.5 tablets (25 mg total) by mouth 2 (two) times daily.      . pantoprazole (PROTONIX) 40 MG tablet Take 40 mg by mouth daily.        . Potassium Gluconate 595 MG CAPS Take 1 capsule by mouth 2 (two) times daily.       Marland Kitchen atorvastatin (LIPITOR) 40 MG tablet Take 1 tablet (40 mg total) by mouth daily.  30 tablet  6  . nitroGLYCERIN (NITROSTAT) 0.4 MG SL tablet Place 0.4 mg under the tongue every 5 (five) minutes as needed. As needed for chest pain.        Past Medical History  Diagnosis Date  . Gastroesophageal reflux disease     chronic  . Atrial fibrillation     Diagnosed in 2005, treated with Coumadin (x1 month)  . Neoplasm of lymphatic  and hematopoietic tissue   . Fatigue     Chronic  . Hypertension   . Anemia, iron deficiency   . CAD (coronary artery disease)     60 % circ stenosis; EF 65%, Cath 6/13  . Cough     Persistent, a. No recent amelioration following the addition of Protonix/discontinuation of ACE inhibitor. b. Status post Nissen fundoplication  . Hypothyroidism 2003    Status post total thyroidectomy  . Complication of anesthesia     "woke up couple times before they got thru working on me"  . Heart murmur   . Dyspnea     Persistent; a. Normal recent cariopulmonary function test in July 2008. b. Undergoing current E[facility] evaluation at [facility]. c. Recent negative echocardiogram with "bubble" study form inter-cardiac shunt.  . DM (diabetes mellitus), type 2, uncontrolled   . Migraines     "stopped after they put me on BP medicine"  . Skin cancer   .  Arthritis     "all over"    Past Surgical History  Procedure Date  . Nissen fundoplication   . Bone marrow biopsy 1990's  . Back surgery   . Posterior laminectomy / decompression lumbar spine ~ 1990's  . Skin cancer excision     "off my back and chest; from sun"  . Cardiac catheterization     History   Social History  . Marital Status: Single    Spouse Name: N/A    Number of Children: N/A  . Years of Education: N/A   Occupational History  . Full Time    Social History Main Topics  . Smoking status: Never Smoker   . Smokeless tobacco: Never Used  . Alcohol Use: No  . Drug Use: No  . Sexually Active: No   Other Topics Concern  . Not on file   Social History Narrative  . No narrative on file    Family History  Problem Relation Age of Onset  . Cancer    . Stroke      ROS: no nausea, vomiting; no fever, chills; no melena, hematochezia; no claudication  PHYSICAL EXAM: BP 128/88  Pulse 88  Ht 5\' 10"  (1.778 m)  Wt 196 lb 12.8 oz (89.268 kg)  BMI 28.24 kg/m2  SpO2 98% GENERAL: 78 year old male; NAD HEENT: NCAT, PERRLA, EOMI; sclera clear; no xanthelasma NECK: palpable bilateral carotid pulses, no bruits; no JVD; no TM LUNGS: CTA bilaterally CARDIAC: RRR (S1, S2); no significant murmurs; no rubs or gallops ABDOMEN: soft, non-tender; intact BS EXTREMETIES: intact distal pulses; no significant peripheral edema SKIN: warm/dry; no obvious rash/lesions MUSCULOSKELETAL: no joint deformity NEURO: no focal deficit; NL affect   EKG: reviewed and available in Electronic Records   ASSESSMENT & PLAN:  Atrial fibrillation Results of recent event monitor were reviewed with Dr. Diona Browner, who noted evidence of possible transient AF. Clinically, patient denied any associated palpitations during this time frame. Given his prior diagnoses of PAF in 2005, for which he was briefly treated with Coumadin, current recommendation is to resume anticoagulation with either Coumadin  or one of the newer agents. We have assessed him with a CHADS2 score of 3 (HTN, DM, age), and CHADSVASC of 4. Patient is not currently inclined to make a final decision; therefore, I have instructed him to research the risk/benefits of being on Coumadin or one of the newer agents, versus ASA alone. He he is to then contact our office with his final decision. In the meanwhile, he  is to continue on full dose aspirin.  CAD, NATIVE VESSEL Quiescent on current medication regimen. We'll initiate statin therapy with Lipitor 40 daily. Followup lipid profile in 12 weeks. Aggressive management recommended with target LDL 70 or less, if feasible.  HYPERTENSION Well-controlled on current medication regimen  DIABETES MELLITUS, TYPE II, UNCONTROLLED Followed by primary M.D.    Gene Malani Lees, PAC

## 2011-11-14 ENCOUNTER — Telehealth: Payer: Self-pay | Admitting: Physician Assistant

## 2011-11-14 NOTE — Telephone Encounter (Signed)
Thinks that he would like to go with Xarelto, but would like to ask some questions    [phone]

## 2011-11-17 MED ORDER — RIVAROXABAN 20 MG PO TABS: 20.0000 mg | ORAL_TABLET | Freq: Every day | ORAL | Status: DC

## 2011-11-17 NOTE — Telephone Encounter (Signed)
Patient has NL renal fxn, therefore Xarelto 20 mg daily is correct. I strongly recommend pt review any available literature online, including official Web site, for additional information regarding the risks/benefits of reducing risk of stroke vs risk of bleeding, besides what we already reviewed in the office.

## 2011-11-17 NOTE — Telephone Encounter (Signed)
Patient has decided to go with the Xarelto.  Think dose should be Xarelto 20mg  - one tab every day with evening meal.    Gene, please confirm above.

## 2011-11-17 NOTE — Telephone Encounter (Signed)
Patient aware of below.  New med sent to [facility] [place].

## 2011-11-24 ENCOUNTER — Telehealth: Payer: Self-pay | Admitting: Cardiology

## 2011-11-24 NOTE — Telephone Encounter (Signed)
Patient wants to know if he is still suppose to take his aspirin along with his xarleto. Best number to call back is [phone].

## 2011-11-26 NOTE — Telephone Encounter (Signed)
See previous phone note about Xarelto decision.  Patient advised to stop ASA.    Patient verbalized understanding.

## 2011-11-26 NOTE — Telephone Encounter (Signed)
I was awaiting his decision as to whether or not he would start Coumadin or Xarelto, versus remaining on full dose ASA. If he has since decided to be on a anticoagulant, then he needs to stop ASA.

## 2011-11-26 NOTE — Telephone Encounter (Signed)
Currently on Aspirin 325mg  daily.  Please advise on below.

## 2011-11-27 ENCOUNTER — Other Ambulatory Visit: Payer: Self-pay | Admitting: Physician Assistant

## 2011-12-16 ENCOUNTER — Other Ambulatory Visit: Payer: Self-pay | Admitting: Cardiology

## 2011-12-16 MED ORDER — METOPROLOL TARTRATE 25 MG PO TABS: 25.0000 mg | ORAL_TABLET | Freq: Two times a day (BID) | ORAL | Status: DC

## 2011-12-16 MED ORDER — RIVAROXABAN 20 MG PO TABS: 20.0000 mg | ORAL_TABLET | Freq: Every day | ORAL | Status: DC

## 2011-12-16 MED ORDER — ATORVASTATIN CALCIUM 40 MG PO TABS: 40.0000 mg | ORAL_TABLET | Freq: Every day | ORAL | Status: DC

## 2011-12-16 NOTE — Telephone Encounter (Signed)
error 

## 2012-02-16 ENCOUNTER — Ambulatory Visit (INDEPENDENT_AMBULATORY_CARE_PROVIDER_SITE_OTHER): Payer: Medicare Other | Admitting: Cardiology

## 2012-02-16 ENCOUNTER — Encounter: Payer: Self-pay | Admitting: Cardiology

## 2012-02-16 VITALS — BP 122/78 | HR 90 | Ht 70.0 in | Wt 200.0 lb

## 2012-02-16 DIAGNOSIS — I10 Essential (primary) hypertension: Secondary | ICD-10-CM

## 2012-02-16 DIAGNOSIS — I2510 Atherosclerotic heart disease of native coronary artery without angina pectoris: Secondary | ICD-10-CM

## 2012-02-16 DIAGNOSIS — E785 Hyperlipidemia, unspecified: Secondary | ICD-10-CM

## 2012-02-16 DIAGNOSIS — I4891 Unspecified atrial fibrillation: Secondary | ICD-10-CM

## 2012-02-16 NOTE — Assessment & Plan Note (Signed)
Continues on Lipitor, followed by Dr. Vyas. 

## 2012-02-16 NOTE — Assessment & Plan Note (Signed)
Paroxysmal, currently in sinus rhythm. Question as to most appropriate dose of Xarelto has been raised. Plan to followup BMET to assess renal function and GFR. Otherwise continue current dose of Lopressor.

## 2012-02-16 NOTE — Assessment & Plan Note (Signed)
He reports no active angina symptoms. Continue medical therapy and observation.

## 2012-02-16 NOTE — Progress Notes (Signed)
Clinical Summary Brett Matthews is a medically complex 79 y.o.male presenting for followup. This is my first meeting with him. He was seen previously by Dr. Andee Lineman, more recently Dr. Antoine Poche, last visit with Mr. Brett Matthews in November 2013. He had worn a cardiac monitor at that time that showed evidence of possible transient atrial fibrillation. In light of his high thromboembolic risk score and previous history of PAF, anticoagulation was recommended over aspirin. At that point he did not want to make a final decision, ultimately was started on Xarelto. He states that he did have some hematochezia on this, dose was reduced from 20 mg to 15 mg daily by Dr. Sherril Croon, and he was told that he had a hemorrhoid causing this. More recently medication was held since he had a growth removed from his neck by Dr. Gabriel Cirri. He has not yet resumed Xarelto yet.  ECG today shows sinus rhythm with PACs. He reports no significant palpitations, no angina.  Lab work as of September 2013 showed a creatinine 0.9, GFR greater than 60. No more recent lab work noted.  No Known Allergies  Current Outpatient Prescriptions  Medication Sig Dispense Refill  . amLODipine (NORVASC) 10 MG tablet Take 10 mg by mouth daily.        Marland Kitchen atorvastatin (LIPITOR) 40 MG tablet Take 1 tablet (40 mg total) by mouth daily.  90 tablet  3  . benazepril (LOTENSIN) 5 MG tablet Take 5 mg by mouth daily.      . diclofenac (VOLTAREN) 75 MG EC tablet Take 1 tablet by mouth Twice daily.      . fexofenadine (ALLEGRA) 180 MG tablet Take 180 mg by mouth daily.      . finasteride (PROSCAR) 5 MG tablet Take 5 mg by mouth daily.        . folic acid (FOLVITE) 1 MG tablet Take 1 mg by mouth daily.        Marland Kitchen glucosamine-chondroitin 500-400 MG tablet Take 1 tablet by mouth 2 (two) times daily.        Marland Kitchen levothyroxine (SYNTHROID, LEVOTHROID) 137 MCG tablet Take 1 tablet (137 mcg total) by mouth daily before breakfast.      . Magnesium 250 MG TABS Take 1 tablet by mouth  daily.      . Melatonin 10 MG TABS Take 1 tablet by mouth at bedtime as needed. Sleep.      . metFORMIN (GLUCOPHAGE) 500 MG tablet Take 1 tablet (500 mg total) by mouth 2 (two) times daily with a meal. HOLD 48 hours, restart on 06/30/2011.      . metoprolol tartrate (LOPRESSOR) 25 MG tablet Take 1 tablet (25 mg total) by mouth 2 (two) times daily.  180 tablet  3  . nitroGLYCERIN (NITROSTAT) 0.4 MG SL tablet Place 0.4 mg under the tongue every 5 (five) minutes as needed. As needed for chest pain.      . pantoprazole (PROTONIX) 40 MG tablet Take 40 mg by mouth daily.        . potassium chloride (MICRO-K) 10 MEQ CR capsule Take 10 mEq by mouth daily.      . Rivaroxaban (XARELTO) 20 MG TABS Take 1 tablet (20 mg total) by mouth daily with supper.  90 tablet  3   No current facility-administered medications for this visit.    Past Medical History  Diagnosis Date  . Gastroesophageal reflux disease   . Atrial fibrillation     Diagnosed in 2005, treated with Coumadin (x1 month)  .  Neoplasm of lymphatic and hematopoietic tissue   . Essential hypertension, benign   . Anemia, iron deficiency     Negative capsule endoscopy  . Coronary atherosclerosis of native coronary artery     60 % circumflex stenosis; EF 65%, Cath 6/13  . Hypothyroidism 2003    Status post total thyroidectomy  . Dyspnea     Normal cardiopulmonary function test in July 2008, negative echocardiogram with "bubble" study form inter-cardiac shunt.  . DM (diabetes mellitus), type 2, uncontrolled   . Migraines   . Skin cancer   . Arthritis     Social History Mr. Weigelt reports that he has never smoked. He has never used smokeless tobacco. Mr. Dault reports that he does not drink alcohol.  Review of Systems Negative except as outlined.  Physical Examination Filed Vitals:   02/16/12 1444  BP: 122/78  Pulse: 90   Filed Weights   02/16/12 1444  Weight: 200 lb (90.719 kg)   No acute distress. HEENT: Conjunctiva and lids  normal, oropharynx clear. Neck: Supple, no elevated JVP or carotid bruits, no thyromegaly. Lungs: Clear to auscultation, nonlabored breathing at rest. Cardiac: Regular rate and rhythm with frequent ectopy, no S3 or significant systolic murmur, no pericardial rub. Abdomen: Soft, nontender, bowel sounds present. Extremities: No pitting edema, distal pulses 2+. Skin: Warm and dry. Musculoskeletal: No kyphosis. Neuropsychiatric: Alert and oriented x3, affect grossly appropriate.   Problem List and Plan   Atrial fibrillation Paroxysmal, currently in sinus rhythm. Question as to most appropriate dose of Xarelto has been raised. Plan to followup BMET to assess renal function and GFR. Otherwise continue current dose of Lopressor.  CAD, NATIVE VESSEL He reports no active angina symptoms. Continue medical therapy and observation.  Dyslipidemia Continues on Lipitor, followed by Dr. Sherril Croon.  Essential hypertension, benign Blood pressure is normal today.    Jonelle Sidle, M.D., F.A.C.C.

## 2012-02-16 NOTE — Patient Instructions (Addendum)
Your physician recommends that you schedule a follow-up appointment in: 3 months. Your physician recommends that you continue on your current medications as directed. Please refer to the Current Medication list given to you today. Your physician recommends that you return for lab work today for [employer] at [facility].

## 2012-02-16 NOTE — Assessment & Plan Note (Signed)
Blood pressure is normal today. 

## 2012-02-18 ENCOUNTER — Encounter: Payer: Self-pay | Admitting: *Deleted

## 2012-02-18 ENCOUNTER — Telehealth: Payer: Self-pay | Admitting: *Deleted

## 2012-02-18 ENCOUNTER — Other Ambulatory Visit: Payer: Self-pay | Admitting: *Deleted

## 2012-02-18 DIAGNOSIS — I2510 Atherosclerotic heart disease of native coronary artery without angina pectoris: Secondary | ICD-10-CM

## 2012-02-18 DIAGNOSIS — Z79899 Other long term (current) drug therapy: Secondary | ICD-10-CM

## 2012-02-18 DIAGNOSIS — E785 Hyperlipidemia, unspecified: Secondary | ICD-10-CM

## 2012-02-18 NOTE — Telephone Encounter (Signed)
Patient informed. 

## 2012-02-18 NOTE — Telephone Encounter (Signed)
Message copied by Eustace Moore on Wed Feb 18, 2012 11:54 AM ------      Message from: Jonelle Sidle      Created: Tue Feb 17, 2012  1:01 PM       Reviewed. Normal creatinine of 0.9 with GFR greater than 60. This would suggest that Xarelto 20 mg daily would be a reasonable dose. If he continues to have trouble with bleeding from hemorrhoids on this dose, he will likely need further GI evaluation. ------

## 2012-03-03 ENCOUNTER — Encounter: Payer: Self-pay | Admitting: *Deleted

## 2012-05-17 ENCOUNTER — Ambulatory Visit: Payer: Medicare Other | Admitting: Cardiology

## 2012-05-19 ENCOUNTER — Ambulatory Visit: Payer: Medicare Other | Admitting: Cardiology

## 2012-05-21 ENCOUNTER — Encounter: Payer: Self-pay | Admitting: Cardiology

## 2012-05-21 ENCOUNTER — Ambulatory Visit (INDEPENDENT_AMBULATORY_CARE_PROVIDER_SITE_OTHER): Payer: Medicare Other | Admitting: Cardiology

## 2012-05-21 VITALS — BP 120/78 | HR 94 | Resp 18 | Ht 69.0 in | Wt 193.5 lb

## 2012-05-21 DIAGNOSIS — I4891 Unspecified atrial fibrillation: Secondary | ICD-10-CM

## 2012-05-21 DIAGNOSIS — I10 Essential (primary) hypertension: Secondary | ICD-10-CM

## 2012-05-21 DIAGNOSIS — I2510 Atherosclerotic heart disease of native coronary artery without angina pectoris: Secondary | ICD-10-CM

## 2012-05-21 NOTE — Patient Instructions (Addendum)
Your physician wants you to follow-up in: 6 months with Dr. McDowell. You will receive a reminder letter in the mail two months in advance. If you don't receive a letter, please call our office to schedule the follow-up appointment.  

## 2012-05-21 NOTE — Assessment & Plan Note (Signed)
Good blood pressure control today, no changes made in regimen.

## 2012-05-21 NOTE — Assessment & Plan Note (Signed)
No active angina. Continue observation.

## 2012-05-21 NOTE — Assessment & Plan Note (Signed)
Paroxysmal, maintaining sinus rhythm on examination. Continue current regimen. Followup arranged.

## 2012-05-21 NOTE — Progress Notes (Signed)
Clinical Summary Mr. Shaff is a 79 y.o.male last seen in February of this year.  He had followup lab work in February showing normal creatinine of 0.9 with GFR greater than 60. Appropriate dose of Xarelto felt to be 20 mg daily based on this. Additional lab work showed normal LFTs, triglycerides 64, cholesterol 111, HDL 40, LDL 50. He is tolerating Lipitor.  He states he has been doing very well, has had no major bleeding problems back on Xarelto. Also no palpitations or chest pain. 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   No Known Allergies  Current Outpatient Prescriptions  Medication Sig Dispense Refill  . amLODipine (NORVASC) 10 MG tablet Take 10 mg by mouth daily.        Marland Kitchen atorvastatin (LIPITOR) 40 MG tablet Take 1 tablet (40 mg total) by mouth daily.  90 tablet  3  . benazepril (LOTENSIN) 5 MG tablet Take 5 mg by mouth daily.      . diclofenac (VOLTAREN) 75 MG EC tablet Take 1 tablet by mouth Twice daily.      . fexofenadine (ALLEGRA) 180 MG tablet Take 180 mg by mouth daily.      . finasteride (PROSCAR) 5 MG tablet Take 5 mg by mouth daily.        . folic acid (FOLVITE) 1 MG tablet Take 1 mg by mouth daily.        Marland Kitchen glucosamine-chondroitin 500-400 MG tablet Take 1 tablet by mouth 2 (two) times daily.        Marland Kitchen levothyroxine (SYNTHROID, LEVOTHROID) 137 MCG tablet Take 1 tablet (137 mcg total) by mouth daily before breakfast.      . Magnesium 250 MG TABS Take 1 tablet by mouth daily.      . Melatonin 10 MG TABS Take 1 tablet by mouth at bedtime as needed. Sleep.      . metFORMIN (GLUCOPHAGE) 500 MG tablet Take 1 tablet (500 mg total) by mouth 2 (two) times daily with a meal. HOLD 48 hours, restart on 06/30/2011.      . metoprolol tartrate (LOPRESSOR) 25 MG tablet Take 1 tablet (25 mg total) by mouth 2 (two) times daily.  180 tablet  3  . nitroGLYCERIN (NITROSTAT) 0.4 MG SL tablet Place 0.4 mg under the tongue every 5 (five) minutes as needed. As needed for chest pain.      .  pantoprazole (PROTONIX) 40 MG tablet Take 40 mg by mouth daily.        . potassium chloride (MICRO-K) 10 MEQ CR capsule Take 10 mEq by mouth daily.      . Rivaroxaban (XARELTO) 20 MG TABS Take 1 tablet (20 mg total) by mouth daily with supper.  90 tablet  3   No current facility-administered medications for this visit.    Past Medical History  Diagnosis Date  . Gastroesophageal reflux disease   . Paroxysmal atrial fibrillation     Diagnosed 2005  . Neoplasm of lymphatic and hematopoietic tissue   . Essential hypertension, benign   . Anemia, iron deficiency     Negative capsule endoscopy  . Coronary atherosclerosis of native coronary artery     60 % circumflex stenosis; EF 65%, Cath 6/13  . Hypothyroidism 2003    Status post total thyroidectomy  . Dyspnea     Normal cardiopulmonary function test in July 2008, negative echocardiogram with "bubble" study form inter-cardiac shunt.  . DM (diabetes mellitus), type 2, uncontrolled   . Migraines   .  Skin cancer   . Arthritis     Social History Mr. Pascua reports that he has never smoked. He has never used smokeless tobacco. Mr. Skibinski reports that he does not drink alcohol.  Review of Systems Stable appetite, no orthopnea or PND. Otherwise negative.  Physical Examination Filed Vitals:   05/21/12 1353  BP: 120/78  Pulse: 94  Resp: 18   Filed Weights   05/21/12 1353  Weight: 193 lb 8 oz (87.771 kg)    No acute distress.  HEENT: Conjunctiva and lids normal, oropharynx clear.  Neck: Supple, no elevated JVP or carotid bruits, no thyromegaly.  Lungs: Clear to auscultation, nonlabored breathing at rest.  Cardiac: Regular rate and rhythm with frequent ectopy, no S3 or significant systolic murmur, no pericardial rub.  Abdomen: Soft, nontender, bowel sounds present.  Extremities: No pitting edema, distal pulses 2+.  Skin: Warm and dry.  Musculoskeletal: No kyphosis.  Neuropsychiatric: Alert and oriented x3, affect grossly  appropriate.   Problem List and Plan   Atrial fibrillation Paroxysmal, maintaining sinus rhythm on examination. Continue current regimen. Followup arranged.  CAD, NATIVE VESSEL No active angina. Continue observation.  Essential hypertension, benign Good blood pressure control today, no changes made in regimen.    Jonelle Sidle, M.D., F.A.C.C.

## 2012-09-14 ENCOUNTER — Encounter: Payer: Self-pay | Admitting: Cardiology

## 2012-10-18 ENCOUNTER — Encounter: Payer: Self-pay | Admitting: Gastroenterology

## 2012-11-03 ENCOUNTER — Ambulatory Visit: Payer: PRIVATE HEALTH INSURANCE | Admitting: Gastroenterology

## 2012-11-09 ENCOUNTER — Encounter: Payer: Self-pay | Admitting: Internal Medicine

## 2012-11-09 ENCOUNTER — Encounter (INDEPENDENT_AMBULATORY_CARE_PROVIDER_SITE_OTHER): Payer: Self-pay

## 2012-11-09 ENCOUNTER — Ambulatory Visit (INDEPENDENT_AMBULATORY_CARE_PROVIDER_SITE_OTHER): Payer: Medicare Other | Admitting: Internal Medicine

## 2012-11-09 VITALS — BP 146/82 | HR 75 | Temp 97.8°F | Wt 196.6 lb

## 2012-11-09 DIAGNOSIS — D509 Iron deficiency anemia, unspecified: Secondary | ICD-10-CM

## 2012-11-09 DIAGNOSIS — R195 Other fecal abnormalities: Secondary | ICD-10-CM

## 2012-11-09 MED ORDER — PEG 3350-KCL-NA BICARB-NACL 420 G PO SOLR: 4000.0000 mL | ORAL | Status: DC

## 2012-11-09 NOTE — Progress Notes (Signed)
Primary Care Physician:  VYAS,DHRUV B., MD Primary Gastroenterologist:  Dr. Trenika Hudson  Pre-Procedure History & Physical: HPI:  Brett Matthews is a 80 y.o. male here for referred by Dr. Vyas iron deficiency anemia and Hemoccult positive stool. Patient states low volume hematochezia intermittently over the past year. Has a long history of iron deficiency anemia with extensive workup occurring in 2009  and 2010. He is now on Zarelto for paroxysmal atrial fibrillation;  He's been on that medication for several months. Tells me he has one bowel movement daily to every other day. He strains at times. Feels he has a hemorrhoid. Colonoscopy in 2009 negative aside from diverticulosis. EGD also negative status post Nissen fundoplication. History of a Given capsule study  -  also negative. He take Voltaren occasionally.. No reflux symptoms on Protonix 40 mg daily. No dysphagia. No change in weight. No early satiety. No family history of GI malignancy. Recent CBC revealed a white count of 6.7 H&H 10.3 and 33.0 MCV 77 platelet count 362,000; H&H was 13 and 38.9 back in June of 2013. Has seen hematology and has received iron infusions over the years  Past Medical History  Diagnosis Date  . Gastroesophageal reflux disease   . Paroxysmal atrial fibrillation     Diagnosed 2005  . Neoplasm of lymphatic and hematopoietic tissue   . Essential hypertension, benign   . Anemia, iron deficiency     Negative capsule endoscopy  . Coronary atherosclerosis of native coronary artery     60 % circumflex stenosis; EF 65%, Cath 6/13  . Hypothyroidism 2003    Status post total thyroidectomy  . Dyspnea     Normal cardiopulmonary function test in July 2008, negative echocardiogram with "bubble" study form inter-cardiac shunt.  . DM (diabetes mellitus), type 2, uncontrolled   . Migraines   . Skin cancer   . Arthritis   . Hyperplastic colon polyp 04/06/2007  . Diverticula of colon     pancolonic    Past Surgical History   Procedure Laterality Date  . Nissen fundoplication    . Bone marrow biopsy  1990's  . Back surgery    . Posterior laminectomy / decompression lumbar spine  ~ 1990's  . Skin cancer excision      "off my back and chest; from sun"  . Esophagogastroduodenoscopy  04/06/2007    Dr. Layli Capshaw- normal esophagus s/p nissen fundoplication, intact nissen wrap o/w normal stomach  . Colonoscopy  04/06/2007    Dr. Myron Stankovich- Normal rectum with scattered pancolonic diverticula and slightly redundant elongated colon, diminutive polpy midsigmoid, remainder of colonic mucosa appeared normal. bx= hyperplastic polyp    Prior to Admission medications   Medication Sig Start Date End Date Taking? Authorizing Provider  amLODipine (NORVASC) 10 MG tablet Take 10 mg by mouth daily.     Yes Historical Provider, MD  atorvastatin (LIPITOR) 40 MG tablet Take 1 tablet (40 mg total) by mouth daily. 12/16/11  Yes Eugene C Serpe, PA-C  benazepril (LOTENSIN) 5 MG tablet Take 5 mg by mouth daily.   Yes Historical Provider, MD  diclofenac (VOLTAREN) 75 MG EC tablet Take 1 tablet by mouth Twice daily. 07/04/10  Yes Historical Provider, MD  fexofenadine (ALLEGRA) 180 MG tablet Take 180 mg by mouth daily.   Yes Historical Provider, MD  finasteride (PROSCAR) 5 MG tablet Take 5 mg by mouth daily.     Yes Historical Provider, MD  folic acid (FOLVITE) 1 MG tablet Take 1 mg by mouth daily.       Yes Historical Provider, MD  glucosamine-chondroitin 500-400 MG tablet Take 1 tablet by mouth 2 (two) times daily.     Yes Historical Provider, MD  Magnesium 250 MG TABS Take 1 tablet by mouth daily.   Yes Historical Provider, MD  Melatonin 10 MG TABS Take 1 tablet by mouth at bedtime as needed. Sleep.   Yes Historical Provider, MD  metFORMIN (GLUCOPHAGE) 500 MG tablet Take 1 tablet (500 mg total) by mouth 2 (two) times daily with a meal. HOLD 48 hours, restart on 06/30/2011. 06/27/11  Yes Rhonda G Barrett, PA-C  metoprolol tartrate (LOPRESSOR) 25 MG tablet  Take 1 tablet (25 mg total) by mouth 2 (two) times daily. 12/16/11  Yes Eugene C Serpe, PA-C  nitroGLYCERIN (NITROSTAT) 0.4 MG SL tablet Place 0.4 mg under the tongue every 5 (five) minutes as needed. As needed for chest pain.   Yes Historical Provider, MD  pantoprazole (PROTONIX) 40 MG tablet Take 40 mg by mouth daily.     Yes Historical Provider, MD  potassium chloride (MICRO-K) 10 MEQ CR capsule Take 10 mEq by mouth daily.   Yes Historical Provider, MD  Rivaroxaban (XARELTO) 20 MG TABS Take 1 tablet (20 mg total) by mouth daily with supper. 12/16/11  Yes Eugene C Serpe, PA-C  levothyroxine (SYNTHROID, LEVOTHROID) 137 MCG tablet Take 1 tablet (137 mcg total) by mouth daily before breakfast. 06/27/11 06/26/12  Rhonda G Barrett, PA-C    Allergies as of 11/09/2012  . (No Known Allergies)    Family History  Problem Relation Age of Onset  . Cancer    . Stroke      History   Social History  . Marital Status: Single    Spouse Name: N/A    Number of Children: N/A  . Years of Education: N/A   Occupational History  . Full Time    Social History Main Topics  . Smoking status: Never Smoker   . Smokeless tobacco: Never Used  . Alcohol Use: No  . Drug Use: No  . Sexual Activity: No   Other Topics Concern  . Not on file   Social History Narrative  . No narrative on file    Review of Systems: See HPI, otherwise negative ROS  Physical Exam: BP 146/82  Pulse 75  Temp(Src) 97.8 F (36.6 C) (Oral)  Wt 196 lb 9.6 oz (89.177 kg) General:   Alert,  Well-developed, well-nourished, pleasant and cooperative in NAD Skin:  Intact without significant lesions or rashes. Eyes:  Sclera clear, no icterus.   Conjunctiva pink. Ears:  Normal auditory acuity. Nose:  No deformity, discharge,  or lesions. Mouth:  No deformity or lesions. Neck:  Supple; no masses or thyromegaly. No significant cervical adenopathy. Lungs:  Clear throughout to auscultation.   No wheezes, crackles, or rhonchi. No  acute distress. Heart:  Regular rate and rhythm; no murmurs, clicks, rubs,  or gallops. Abdomen: Non-distended, normal bowel sounds.  Soft and nontender without appreciable mass or hepatosplenomegaly.  Pulses:  Normal pulses noted. Extremities:  Without clubbing or edema.  Impression: Pleasant 80-year-old, able bodied male, referred for evaluation of iron deficiency anemia, hematochezia and Hemoccult-positive stool. He is now anticoagulated. I agree with Dr. Vyas, he needs a GI evaluation. He will need a colonoscopy in the near future as well as possible upper endoscopy  Recommendations:  Diagnostic colonoscopy and possible EGD in near future the hospital.The risks, benefits, limitations, imponderables and alternatives regarding both EGD and colonoscopy have been reviewed with the patient.   Questions have been answered. All parties agreeable.   He will need to hold his Zarelto for 2 days prior to the procedure. I discussed the risks and benefits of this approach with him as well. Questions have been answered. He is agreeable. 

## 2012-11-09 NOTE — Patient Instructions (Signed)
Schedule a colonoscopy and possible EGD in the near future (IDA, hemocult positive stool)  Hold Zarelto 2 days prior to procedure  Split Movie prep

## 2012-11-10 ENCOUNTER — Encounter: Payer: Self-pay | Admitting: Cardiology

## 2012-11-10 ENCOUNTER — Ambulatory Visit (INDEPENDENT_AMBULATORY_CARE_PROVIDER_SITE_OTHER): Payer: Medicare Other | Admitting: Cardiology

## 2012-11-10 VITALS — BP 129/87 | HR 63 | Ht 70.0 in | Wt 197.0 lb

## 2012-11-10 DIAGNOSIS — I4891 Unspecified atrial fibrillation: Secondary | ICD-10-CM

## 2012-11-10 DIAGNOSIS — I10 Essential (primary) hypertension: Secondary | ICD-10-CM

## 2012-11-10 DIAGNOSIS — I2510 Atherosclerotic heart disease of native coronary artery without angina pectoris: Secondary | ICD-10-CM

## 2012-11-10 DIAGNOSIS — E785 Hyperlipidemia, unspecified: Secondary | ICD-10-CM

## 2012-11-10 NOTE — Progress Notes (Signed)
Clinical Summary Mr. Brett Matthews is a 79 y.o.male last seen in May of this year. He reports no problems with palpitations or chest pain. He has continued on Xarelto, although reports recurrent problems with hematochezia. In reviewing the records I see that he has undergone evaluation by Dr. Jena Gauss and plans to undergo a colonoscopy this month. Xarelto to be held temporarily for this procedure. Does have prior documentation of diverticulosis as of 2009, negative EGD status post Nissen fundoplication, history of negative capsule study as well.  He reports no new cardiac complaints, no speech deficits or focal motor weakness. Continues to work full-time. Reports compliance with his medications.   No Known Allergies  Current Outpatient Prescriptions  Medication Sig Dispense Refill  . amLODipine (NORVASC) 10 MG tablet Take 10 mg by mouth daily.        Marland Kitchen atorvastatin (LIPITOR) 40 MG tablet Take 1 tablet (40 mg total) by mouth daily.  90 tablet  3  . benazepril (LOTENSIN) 5 MG tablet Take 5 mg by mouth daily.      . diclofenac (VOLTAREN) 75 MG EC tablet Take 1 tablet by mouth daily.       Marland Kitchen DYMISTA 137-50 MCG/ACT SUSP Place 1 spray into the nose daily.       . finasteride (PROSCAR) 5 MG tablet Take 5 mg by mouth daily.        . folic acid (FOLVITE) 1 MG tablet Take 1 mg by mouth daily.        Marland Kitchen glucosamine-chondroitin 500-400 MG tablet Take 1 tablet by mouth 2 (two) times daily.        Marland Kitchen levothyroxine (SYNTHROID, LEVOTHROID) 125 MCG tablet Take 125 mcg by mouth daily before breakfast.      . Magnesium 250 MG TABS Take 1 tablet by mouth daily.      . metFORMIN (GLUCOPHAGE) 500 MG tablet Take 1 tablet (500 mg total) by mouth 2 (two) times daily with a meal. HOLD 48 hours, restart on 06/30/2011.      . metoprolol tartrate (LOPRESSOR) 25 MG tablet Take 1 tablet (25 mg total) by mouth 2 (two) times daily.  180 tablet  3  . pantoprazole (PROTONIX) 40 MG tablet Take 40 mg by mouth daily.        . potassium  chloride (MICRO-K) 10 MEQ CR capsule Take 10 mEq by mouth daily.      . Rivaroxaban (XARELTO) 20 MG TABS Take 1 tablet (20 mg total) by mouth daily with supper.  90 tablet  3  . tamsulosin (FLOMAX) 0.4 MG CAPS capsule Take 0.4 mg by mouth daily after supper.       . polyethylene glycol-electrolytes (TRILYTE) 420 G solution Take 4,000 mLs by mouth as directed.  4000 mL  0   No current facility-administered medications for this visit.    Past Medical History  Diagnosis Date  . Gastroesophageal reflux disease   . Paroxysmal atrial fibrillation     Diagnosed 2005  . Neoplasm of lymphatic and hematopoietic tissue   . Essential hypertension, benign   . Anemia, iron deficiency     Negative capsule endoscopy  . Coronary atherosclerosis of native coronary artery     60 % circumflex stenosis; EF 65%, Cath 6/13  . Hypothyroidism 2003    Status post total thyroidectomy  . Dyspnea     Normal cardiopulmonary function test in July 2008, negative echocardiogram with "bubble" study form inter-cardiac shunt.  . DM (diabetes mellitus), type 2, uncontrolled   .  Migraines   . Skin cancer   . Arthritis   . Hyperplastic colon polyp 04/06/2007  . Diverticula of colon     pancolonic    Social History Brett Matthews reports that he has never smoked. He has never used smokeless tobacco. Brett Matthews reports that he does not drink alcohol.  Review of Systems Negative except as outlined.  Physical Examination Filed Vitals:   11/10/12 1129  BP: 129/87  Pulse: 63   Filed Weights   11/10/12 1129  Weight: 197 lb (89.359 kg)    No acute distress.  HEENT: Conjunctiva and lids normal, oropharynx clear.  Neck: Supple, no elevated JVP or carotid bruits, no thyromegaly.  Lungs: Clear to auscultation, nonlabored breathing at rest.  Cardiac: Regular rate and rhythm with frequent ectopy, no S3 or significant systolic murmur, no pericardial rub.  Abdomen: Soft, nontender, bowel sounds present.  Extremities: No  pitting edema, distal pulses 2+.    Problem List and Plan   Atrial fibrillation Paroxysmal, for now continue strategy of medical therapy with Lopressor and Xarelto. He reports no recent palpitations, has regular rate and rhythm today on exam. He will be holding Xarelto temporarily for an upcoming colonoscopy with diagnosis of iron deficiency anemia and hematochezia. Hopefully this testing will help [place] better understand whether he is a candidate to continue anticoagulation long-term or not.  CAD, NATIVE VESSEL No active angina symptoms, continue medical therapy and observation.  Essential hypertension, benign No change in current regimen, keep followup with Dr. Sherril Croon.  Dyslipidemia Patient continues on Lipitor.    Jonelle Sidle, M.D., F.A.C.C.

## 2012-11-10 NOTE — Assessment & Plan Note (Signed)
No change in current regimen, keep followup with Dr. Sherril Croon.

## 2012-11-10 NOTE — Assessment & Plan Note (Signed)
Patient continues on Lipitor. 

## 2012-11-10 NOTE — Patient Instructions (Signed)
Your physician recommends that you schedule a follow-up appointment in: 4 months.  Your physician recommends that you continue on your current medications as directed. Please refer to the Current Medication list given to you today.  

## 2012-11-10 NOTE — Assessment & Plan Note (Signed)
No active angina symptoms, continue medical therapy and observation. 

## 2012-11-10 NOTE — Assessment & Plan Note (Signed)
Paroxysmal, for now continue strategy of medical therapy with Lopressor and Xarelto. He reports no recent palpitations, has regular rate and rhythm today on exam. He will be holding Xarelto temporarily for an upcoming colonoscopy with diagnosis of iron deficiency anemia and hematochezia. Hopefully this testing will help [place] better understand whether he is a candidate to continue anticoagulation long-term or not.

## 2012-11-19 ENCOUNTER — Ambulatory Visit: Payer: PRIVATE HEALTH INSURANCE | Admitting: Cardiology

## 2012-11-29 ENCOUNTER — Encounter (HOSPITAL_COMMUNITY)
Admission: RE | Disposition: A | Discharge status: Home / Self Care | Payer: Self-pay | Source: Ambulatory Visit | Attending: Internal Medicine

## 2012-11-29 ENCOUNTER — Ambulatory Visit (HOSPITAL_COMMUNITY)
Admission: RE | Admit: 2012-11-29 | Discharge: 2012-11-29 | Disposition: A | Discharge status: Home / Self Care | Payer: Medicare Other | Source: Ambulatory Visit | Attending: Internal Medicine | Admitting: Internal Medicine

## 2012-11-29 ENCOUNTER — Encounter (HOSPITAL_COMMUNITY): Payer: Self-pay | Admitting: *Deleted

## 2012-11-29 DIAGNOSIS — Z01812 Encounter for preprocedural laboratory examination: Secondary | ICD-10-CM | POA: Insufficient documentation

## 2012-11-29 DIAGNOSIS — D131 Benign neoplasm of stomach: Secondary | ICD-10-CM

## 2012-11-29 DIAGNOSIS — D509 Iron deficiency anemia, unspecified: Secondary | ICD-10-CM | POA: Insufficient documentation

## 2012-11-29 DIAGNOSIS — E119 Type 2 diabetes mellitus without complications: Secondary | ICD-10-CM | POA: Insufficient documentation

## 2012-11-29 DIAGNOSIS — K648 Other hemorrhoids: Secondary | ICD-10-CM | POA: Insufficient documentation

## 2012-11-29 DIAGNOSIS — I10 Essential (primary) hypertension: Secondary | ICD-10-CM | POA: Insufficient documentation

## 2012-11-29 DIAGNOSIS — R195 Other fecal abnormalities: Secondary | ICD-10-CM

## 2012-11-29 DIAGNOSIS — K921 Melena: Secondary | ICD-10-CM | POA: Insufficient documentation

## 2012-11-29 HISTORY — PX: ESOPHAGOGASTRODUODENOSCOPY: SHX5428

## 2012-11-29 HISTORY — PX: COLONOSCOPY: SHX5424

## 2012-11-29 LAB — GLUCOSE, CAPILLARY: Glucose-Capillary: 144 mg/dL — ABNORMAL HIGH (ref 70–99)

## 2012-11-29 SURGERY — COLONOSCOPY
Anesthesia: Moderate Sedation

## 2012-11-29 MED ORDER — MIDAZOLAM HCL 5 MG/5ML IJ SOLN
INTRAMUSCULAR | Status: AC
  Filled 2012-11-29: qty 10

## 2012-11-29 MED ORDER — STERILE WATER FOR IRRIGATION IR SOLN
Status: DC | PRN
  Administered 2012-11-29: 10:00:00

## 2012-11-29 MED ORDER — ONDANSETRON HCL 4 MG/2ML IJ SOLN
INTRAMUSCULAR | Status: DC | PRN
  Administered 2012-11-29: 4 mg via INTRAVENOUS

## 2012-11-29 MED ORDER — MEPERIDINE HCL 100 MG/ML IJ SOLN
INTRAMUSCULAR | Status: DC | PRN
  Administered 2012-11-29: 50 mg via INTRAVENOUS

## 2012-11-29 MED ORDER — ONDANSETRON HCL 4 MG/2ML IJ SOLN
INTRAMUSCULAR | Status: AC
  Filled 2012-11-29: qty 2

## 2012-11-29 MED ORDER — SODIUM CHLORIDE 0.9 % IV SOLN
INTRAVENOUS | Status: DC
  Administered 2012-11-29: 1000 mL via INTRAVENOUS

## 2012-11-29 MED ORDER — MEPERIDINE HCL 100 MG/ML IJ SOLN
INTRAMUSCULAR | Status: AC
  Filled 2012-11-29: qty 2

## 2012-11-29 MED ORDER — BUTAMBEN-TETRACAINE-BENZOCAINE 2-2-14 % EX AERO
INHALATION_SPRAY | CUTANEOUS | Status: DC | PRN
  Administered 2012-11-29: 2 via TOPICAL

## 2012-11-29 MED ORDER — MIDAZOLAM HCL 5 MG/5ML IJ SOLN
INTRAMUSCULAR | Status: DC | PRN
  Administered 2012-11-29 (×2): 1 mg via INTRAVENOUS
  Administered 2012-11-29: 2 mg via INTRAVENOUS
  Administered 2012-11-29: 1 mg via INTRAVENOUS

## 2012-11-29 NOTE — H&P (View-Only) (Signed)
Primary Care Physician:  Ignatius Specking., MD Primary Gastroenterologist:  Dr. Jena Gauss  Pre-Procedure History & Physical: HPI:  Brett Matthews is a 79 y.o. male here for referred by Dr. Sherril Croon iron deficiency anemia and Hemoccult positive stool. Patient states low volume hematochezia intermittently over the past year. Has a long history of iron deficiency anemia with extensive workup occurring in 2009  and 2010. He is now on Zarelto for paroxysmal atrial fibrillation;  He's been on that medication for several months. Tells me he has one bowel movement daily to every other day. He strains at times. Feels he has a hemorrhoid. Colonoscopy in 2009 negative aside from diverticulosis. EGD also negative status post Nissen fundoplication. History of a Given capsule study  -  also negative. He take Voltaren occasionally.. No reflux symptoms on Protonix 40 mg daily. No dysphagia. No change in weight. No early satiety. No family history of GI malignancy. Recent CBC revealed a white count of 6.7 H&H 10.3 and 33.0 MCV 77 platelet count 362,000; H&H was 13 and 38.9 back in June of 2013. Has seen hematology and has received iron infusions over the years  Past Medical History  Diagnosis Date  . Gastroesophageal reflux disease   . Paroxysmal atrial fibrillation     Diagnosed 2005  . Neoplasm of lymphatic and hematopoietic tissue   . Essential hypertension, benign   . Anemia, iron deficiency     Negative capsule endoscopy  . Coronary atherosclerosis of native coronary artery     60 % circumflex stenosis; EF 65%, Cath 6/13  . Hypothyroidism 2003    Status post total thyroidectomy  . Dyspnea     Normal cardiopulmonary function test in July 2008, negative echocardiogram with "bubble" study form inter-cardiac shunt.  . DM (diabetes mellitus), type 2, uncontrolled   . Migraines   . Skin cancer   . Arthritis   . Hyperplastic colon polyp 04/06/2007  . Diverticula of colon     pancolonic    Past Surgical History   Procedure Laterality Date  . Nissen fundoplication    . Bone marrow biopsy  1990's  . Back surgery    . Posterior laminectomy / decompression lumbar spine  ~ 1990's  . Skin cancer excision      "off my back and chest; from sun"  . Esophagogastroduodenoscopy  04/06/2007    Dr. Jena Gauss- normal esophagus s/p nissen fundoplication, intact nissen wrap o/w normal stomach  . Colonoscopy  04/06/2007    Dr. Jena Gauss- Normal rectum with scattered pancolonic diverticula and slightly redundant elongated colon, diminutive polpy midsigmoid, remainder of colonic mucosa appeared normal. bx= hyperplastic polyp    Prior to Admission medications   Medication Sig Start Date End Date Taking? Authorizing Provider  amLODipine (NORVASC) 10 MG tablet Take 10 mg by mouth daily.     Yes Historical Provider, MD  atorvastatin (LIPITOR) 40 MG tablet Take 1 tablet (40 mg total) by mouth daily. 12/16/11  Yes Eugene C Serpe, PA-C  benazepril (LOTENSIN) 5 MG tablet Take 5 mg by mouth daily.   Yes Historical Provider, MD  diclofenac (VOLTAREN) 75 MG EC tablet Take 1 tablet by mouth Twice daily. 07/04/10  Yes Historical Provider, MD  fexofenadine (ALLEGRA) 180 MG tablet Take 180 mg by mouth daily.   Yes Historical Provider, MD  finasteride (PROSCAR) 5 MG tablet Take 5 mg by mouth daily.     Yes Historical Provider, MD  folic acid (FOLVITE) 1 MG tablet Take 1 mg by mouth daily.  Yes Historical Provider, MD  glucosamine-chondroitin 500-400 MG tablet Take 1 tablet by mouth 2 (two) times daily.     Yes Historical Provider, MD  Magnesium 250 MG TABS Take 1 tablet by mouth daily.   Yes Historical Provider, MD  Melatonin 10 MG TABS Take 1 tablet by mouth at bedtime as needed. Sleep.   Yes Historical Provider, MD  metFORMIN (GLUCOPHAGE) 500 MG tablet Take 1 tablet (500 mg total) by mouth 2 (two) times daily with a meal. HOLD 48 hours, restart on 06/30/2011. 06/27/11  Yes Rhonda G Barrett, PA-C  metoprolol tartrate (LOPRESSOR) 25 MG tablet  Take 1 tablet (25 mg total) by mouth 2 (two) times daily. 12/16/11  Yes Rande Brunt, PA-C  nitroGLYCERIN (NITROSTAT) 0.4 MG SL tablet Place 0.4 mg under the tongue every 5 (five) minutes as needed. As needed for chest pain.   Yes Historical Provider, MD  pantoprazole (PROTONIX) 40 MG tablet Take 40 mg by mouth daily.     Yes Historical Provider, MD  potassium chloride (MICRO-K) 10 MEQ CR capsule Take 10 mEq by mouth daily.   Yes Historical Provider, MD  Rivaroxaban (XARELTO) 20 MG TABS Take 1 tablet (20 mg total) by mouth daily with supper. 12/16/11  Yes Rande Brunt, PA-C  levothyroxine (SYNTHROID, LEVOTHROID) 137 MCG tablet Take 1 tablet (137 mcg total) by mouth daily before breakfast. 06/27/11 06/26/12  Joline Salt Barrett, PA-C    Allergies as of 11/09/2012  . (No Known Allergies)    Family History  Problem Relation Age of Onset  . Cancer    . Stroke      History   Social History  . Marital Status: Single    Spouse Name: N/A    Number of Children: N/A  . Years of Education: N/A   Occupational History  . Full Time    Social History Main Topics  . Smoking status: Never Smoker   . Smokeless tobacco: Never Used  . Alcohol Use: No  . Drug Use: No  . Sexual Activity: No   Other Topics Concern  . Not on file   Social History Narrative  . No narrative on file    Review of Systems: See HPI, otherwise negative ROS  Physical Exam: BP 146/82  Pulse 75  Temp(Src) 97.8 F (36.6 C) (Oral)  Wt 196 lb 9.6 oz (89.177 kg) General:   Alert,  Well-developed, well-nourished, pleasant and cooperative in NAD Skin:  Intact without significant lesions or rashes. Eyes:  Sclera clear, no icterus.   Conjunctiva pink. Ears:  Normal auditory acuity. Nose:  No deformity, discharge,  or lesions. Mouth:  No deformity or lesions. Neck:  Supple; no masses or thyromegaly. No significant cervical adenopathy. Lungs:  Clear throughout to auscultation.   No wheezes, crackles, or rhonchi. No  acute distress. Heart:  Regular rate and rhythm; no murmurs, clicks, rubs,  or gallops. Abdomen: Non-distended, normal bowel sounds.  Soft and nontender without appreciable mass or hepatosplenomegaly.  Pulses:  Normal pulses noted. Extremities:  Without clubbing or edema.  Impression: Pleasant 79 year old, able bodied male, referred for evaluation of iron deficiency anemia, hematochezia and Hemoccult-positive stool. He is now anticoagulated. I agree with Dr. Sherril Croon, he needs a GI evaluation. He will need a colonoscopy in the near future as well as possible upper endoscopy  Recommendations:  Diagnostic colonoscopy and possible EGD in near future the hospital.The risks, benefits, limitations, imponderables and alternatives regarding both EGD and colonoscopy have been reviewed with the patient.  Questions have been answered. All parties agreeable.   He will need to hold his Zarelto for 2 days prior to the procedure. I discussed the risks and benefits of this approach with him as well. Questions have been answered. He is agreeable.

## 2012-11-29 NOTE — Interval H&P Note (Signed)
History and Physical Interval Note:  11/29/2012 9:40 AM  Brett Matthews  has presented today for surgery, with the diagnosis of IDA  AND HEME POSITIVE STOOLS  The various methods of treatment have been discussed with the patient and family. After consideration of risks, benefits and other options for treatment, the patient has consented to  Procedure(s) with comments: COLONOSCOPY (N/A) - 9:45 ESOPHAGOGASTRODUODENOSCOPY (EGD) (N/A) as a surgical intervention .  The patient's history has been reviewed, patient examined, no change in status, stable for surgery.  I have reviewed the patient's chart and labs.  Questions were answered to the patient's satisfaction.       Patient without change. Colonoscopy with possible upper per plan.The risks, benefits, limitations, imponderables and alternatives regarding both EGD and colonoscopy have been reviewed with the patient. Questions have been answered. All parties agreeable.    Eula Listen

## 2012-11-29 NOTE — Op Note (Signed)
[facility] [address] [place], [address]   ENDOSCOPY PROCEDURE REPORT  PATIENT: Brett Matthews, Brett Matthews  MR#: [id number] BIRTHDATE: [birth year] , 80  yrs. old GENDER: Male ENDOSCOPIST: R.  Roetta Sessions, MD FACP FACG REFERRED BY:  Doreen Beam, M.D. PROCEDURE DATE:  11/29/2012 PROCEDURE:     EGD with duodenal, gastric and esophageal biopsy  INDICATIONS:     iron deficiency anemia; Hemoccult positive stool; no colonoscopy  INFORMED CONSENT:   The risks, benefits, limitations, alternatives and imponderables have been discussed.  The potential for biopsy, esophogeal dilation, etc. have also been reviewed.  Questions have been answered.  All parties agreeable.  Please see the history and physical in the medical record for more information.  MEDICATIONS:   Versed 5 mg IV and Demerol 50 mg IV in divided doses. Cetacaine spray. Zofran 4 mg  DESCRIPTION OF PROCEDURE:   The EC-[id number] ([id number]) and [id number] ([id number])  endoscope was introduced through the mouth and advanced to the second portion of the duodenum without difficulty or limitations.  The mucosal surfaces were surveyed very carefully during advancement of the scope and upon withdrawal.  Retroflexion view of the proximal stomach and esophagogastric junction was performed.      FINDINGS: Accentuated undulating Z line-query short segment Barrett's esophagus; otherwise normal-appearing esophagus. Stomach empty. Patient multiple 1-3 mm. Benign gastric polyps fundoplication remains intact. Otherwise, gastric mucosa appeared normal. Patent pylorus. Normal first, second and third portion of the duodenum.  THERAPEUTIC / DIAGNOSTIC MANEUVERS PERFORMED:   COMPLICATIONS:  None  IMPRESSION:    Abnormall distal esophagus of uncertain significance-status post biopsy -  status post prior fundoplication. Gastric polyps  -status post biopsy Status post biopsy of normal--appearing duodenal mucosaan  RECOMMENDATIONS:    Follow up on biopsies. See colonoscopy report. Hemoccult-positive stool and some component of his anemia could be related to symptomatic hemorrhoids. Further recommendations to follow up    _______________________________ R. Roetta Sessions, MD FACP [facility] eSigned:  R. Roetta Sessions, MD FACP [facility] 11/29/2012 10:30 AM     CC:

## 2012-11-29 NOTE — Op Note (Signed)
[facility] [address] [place], [address]   COLONOSCOPY PROCEDURE REPORT  PATIENT: Brett Matthews, Brett Matthews  MR#:         [id number] BIRTHDATE: April 01, 1932 , 80  yrs. old GENDER: Male ENDOSCOPIST: R.  Roetta Sessions, MD FACP FACG REFERRED BY:  Doreen Beam, M.D. PROCEDURE DATE:  11/29/2012 PROCEDURE:     Diagnostic colonoscopy  INDICATIONS: Hemoccult-positive stool; iron deficiency anemia  INFORMED CONSENT:  The risks, benefits, alternatives and imponderables including but not limited to bleeding, perforation as well as the possibility of a missed lesion have been reviewed.  The potential for biopsy, lesion removal, etc. have also been discussed.  Questions have been answered.  All parties agreeable. Please see the history and physical in the medical record for more information.  MEDICATIONS: Versed 5 mg IV and Demerol 50 mg IV in divided doses. Zofran 4 mg IV.  DESCRIPTION OF PROCEDURE:  After a digital rectal exam was performed, the EC-[id number] ([id number])  colonoscope was advanced from the anus through the rectum and colon to the area of the cecum, ileocecal valve and appendiceal orifice.  The cecum was deeply intubated.  These structures were well-seen and photographed for the record.  From the level of the cecum and ileocecal valve, the scope was slowly and cautiously withdrawn.  The mucosal surfaces were carefully surveyed utilizing scope tip deflection to facilitate fold flattening as needed.  The scope was pulled down into the rectum where a thorough examination including retroflexion was performed.    FINDINGS:  Adequate preparation. Friable anal canal/internal hemorrhoids; otherwise, normal rectum. Normal-appearing colonic mucosa.  THERAPEUTIC / DIAGNOSTIC MANEUVERS PERFORMED:  None  COMPLICATIONS: none  CECAL WITHDRAWAL TIME:  10 minutes  IMPRESSION:  Friable hemorrhoids-likely cause of Hemoccult-positive stool  RECOMMENDATIONS: Patient may benefit  from hemorrhoidal banding but chronic anticoagulation therapy may make this more challenging.  See EGD report.   _______________________________ eSigned:  R. Roetta Sessions, MD FACP [facility] 11/29/2012 10:11 AM   CC:

## 2012-12-06 ENCOUNTER — Encounter (HOSPITAL_COMMUNITY): Payer: Self-pay | Admitting: Internal Medicine

## 2012-12-06 ENCOUNTER — Encounter: Payer: Self-pay | Admitting: Internal Medicine

## 2012-12-06 ENCOUNTER — Telehealth: Payer: Self-pay

## 2012-12-06 NOTE — Telephone Encounter (Signed)
Letter from: [name]  Reason for Letter: Results Review  Send letter to patient.  Send copy of letter with path to referring provider and PCP.    Patient needs an office visit with extender in about 4-6 weeks. He may be a candidate for hemorrhoidal banding however anticoagulation therapy may increase the bleeding risk.

## 2012-12-06 NOTE — Telephone Encounter (Signed)
CC PCP 

## 2012-12-06 NOTE — Telephone Encounter (Signed)
Pt is aware of OV on 1/9 at 10 with LSL and appt card was mailed

## 2012-12-06 NOTE — Telephone Encounter (Signed)
Letter mailed to pt.  

## 2012-12-07 ENCOUNTER — Other Ambulatory Visit: Payer: Self-pay | Admitting: Physician Assistant

## 2012-12-20 ENCOUNTER — Encounter: Payer: Self-pay | Admitting: Cardiology

## 2012-12-21 ENCOUNTER — Other Ambulatory Visit: Payer: Self-pay | Admitting: Physician Assistant

## 2013-01-14 ENCOUNTER — Ambulatory Visit: Payer: Medicare Other | Admitting: Gastroenterology

## 2013-01-17 ENCOUNTER — Encounter: Payer: Self-pay | Admitting: Cardiology

## 2013-02-04 ENCOUNTER — Telehealth: Payer: Self-pay | Admitting: Internal Medicine

## 2013-02-04 NOTE — Telephone Encounter (Signed)
We are out of amitiza 5mcg. Also we have never rx'd it for this pt.   Manuela Schwartz, please tell pts friend when they check in. Thanks.

## 2013-02-04 NOTE — Telephone Encounter (Signed)
Pt has OV on 2/19 at 1030 with LSL and asked if he could get Amitiza 24 samples. His friend has OV here this morning and could we just give them to her to pick up for him

## 2013-02-08 ENCOUNTER — Telehealth: Payer: Self-pay

## 2013-02-08 NOTE — Telephone Encounter (Signed)
RMR saw pt at the hospital this morning after pts friend had her procedure done. Pt informed RMR that the amitiza wasn't working well and RMR said we could give him samples of linzess 185mcg. Pt is to take 1-2 daily prn constipation. #4 boxes at the front desk for pt to pick up.

## 2013-02-08 NOTE — Telephone Encounter (Signed)
Thanks

## 2013-02-24 ENCOUNTER — Ambulatory Visit: Payer: Medicare Other | Admitting: Gastroenterology

## 2013-02-24 ENCOUNTER — Telehealth: Payer: Self-pay | Admitting: Gastroenterology

## 2013-02-24 NOTE — Telephone Encounter (Signed)
Pt was a no show

## 2013-03-14 ENCOUNTER — Ambulatory Visit (INDEPENDENT_AMBULATORY_CARE_PROVIDER_SITE_OTHER): Payer: Medicare Other | Admitting: Cardiology

## 2013-03-14 ENCOUNTER — Encounter: Payer: Self-pay | Admitting: Cardiology

## 2013-03-14 VITALS — BP 125/78 | HR 65 | Ht 69.0 in | Wt 186.1 lb

## 2013-03-14 DIAGNOSIS — I2510 Atherosclerotic heart disease of native coronary artery without angina pectoris: Secondary | ICD-10-CM

## 2013-03-14 DIAGNOSIS — I4891 Unspecified atrial fibrillation: Secondary | ICD-10-CM

## 2013-03-14 DIAGNOSIS — I10 Essential (primary) hypertension: Secondary | ICD-10-CM

## 2013-03-14 NOTE — Assessment & Plan Note (Signed)
Blood pressure control is good today. No changes made. 

## 2013-03-14 NOTE — Patient Instructions (Signed)
Continue all current medications. Your physician wants you to follow up in: 6 months.  You will receive a reminder letter in the mail one-two months in advance.  If you don't receive a letter, please call our office to schedule the follow up appointment   

## 2013-03-14 NOTE — Assessment & Plan Note (Signed)
No active angina symptoms. Plan to continue medical therapy and observation. 

## 2013-03-14 NOTE — Progress Notes (Signed)
Clinical Summary Brett Matthews is a 80 y.o.male last seen in November 2014. He continues to do reasonably well from a cardiac perspective, no chest pain symptoms, only occasional palpitations.  ECG today shows sinus rhythm with low voltage.  He has had GI evaluation with Dr. Gala Romney. He tells me that he has hemorrhoids, may be in need of banding. He has been back on Xarelto since EGD and colonoscopy.  Echocardiogram done at [facility] in December 2014 described mild LVH with LVEF 56-21%, grade 1 diastolic dysfunction, mildly dilated left atrium, mild mitral regurgitation and tricuspid regurgitation, RVSP 25-35 mm mercury. Trace aortic regurgitation.    No Known Allergies  Current Outpatient Prescriptions  Medication Sig Dispense Refill  . amLODipine (NORVASC) 10 MG tablet Take 10 mg by mouth daily.        Marland Kitchen atorvastatin (LIPITOR) 40 MG tablet take 1 tablet by mouth once daily  90 tablet  3  . Azelastine HCl 0.15 % SOLN Place 1 spray into the nose as needed.      . benazepril (LOTENSIN) 5 MG tablet Take 5 mg by mouth daily.      . clonazePAM (KLONOPIN) 0.5 MG tablet Take 0.25 mg by mouth as needed.      . diclofenac (VOLTAREN) 75 MG EC tablet Take 1 tablet by mouth daily.       Marland Kitchen dutasteride (AVODART) 0.5 MG capsule Take 0.5 mg by mouth daily.      Marland Kitchen DYMISTA 137-50 MCG/ACT SUSP Place 1 spray into the nose daily.       . finasteride (PROSCAR) 5 MG tablet Take 5 mg by mouth daily.        . folic acid (FOLVITE) 1 MG tablet Take 1 mg by mouth daily.        Marland Kitchen glucosamine-chondroitin 500-400 MG tablet Take 1 tablet by mouth 2 (two) times daily.        Marland Kitchen levothyroxine (SYNTHROID, LEVOTHROID) 125 MCG tablet Take 125 mcg by mouth daily before breakfast.      . lubiprostone (AMITIZA) 24 MCG capsule Take 24 mcg by mouth daily.      . Magnesium 250 MG TABS Take 1 tablet by mouth daily.      . metFORMIN (GLUCOPHAGE) 500 MG tablet Take 1 tablet (500 mg total) by mouth 2 (two) times daily  with a meal. HOLD 48 hours, restart on 06/30/2011.      . metoprolol tartrate (LOPRESSOR) 25 MG tablet take 1 tablet by mouth twice a day  180 tablet  3  . pantoprazole (PROTONIX) 40 MG tablet Take 40 mg by mouth daily.        . potassium chloride (MICRO-K) 10 MEQ CR capsule Take 10 mEq by mouth daily.      . Rivaroxaban (XARELTO) 20 MG TABS Take 1 tablet (20 mg total) by mouth daily with supper.  90 tablet  3  . tamsulosin (FLOMAX) 0.4 MG CAPS capsule Take 0.4 mg by mouth daily after supper.        No current facility-administered medications for this visit.    Past Medical History  Diagnosis Date  . Gastroesophageal reflux disease   . Paroxysmal atrial fibrillation     Diagnosed 2005  . Neoplasm of lymphatic and hematopoietic tissue   . Essential hypertension, benign   . Anemia, iron deficiency     Negative capsule endoscopy  . Coronary atherosclerosis of native coronary artery     60 % circumflex stenosis; EF 65%,  Cath 6/13  . Hypothyroidism 2003    Status post total thyroidectomy  . Dyspnea     Normal cardiopulmonary function test in July 2008, negative echocardiogram with "bubble" study form inter-cardiac shunt.  . DM (diabetes mellitus), type 2, uncontrolled   . Migraines   . Skin cancer   . Arthritis   . Hyperplastic colon polyp 04/06/2007  . Diverticula of colon     pancolonic    Social History Brett Matthews reports that he has never smoked. He has never used smokeless tobacco. Brett Matthews reports that he does not drink alcohol.  Review of Systems Stable NYHA class II dyspnea. No orthopnea or PND. Otherwise as outlined above.  Physical Examination Filed Vitals:   03/14/13 1100  BP: 125/78  Pulse: 65   Filed Weights   03/14/13 1100  Weight: 186 lb 1.9 oz (84.423 kg)    No acute distress.  HEENT: Conjunctiva and lids normal, oropharynx clear.  Neck: Supple, no elevated JVP or carotid bruits, no thyromegaly.  Lungs: Clear to auscultation, nonlabored breathing at  rest.  Cardiac: Regular rate and rhythm with frequent ectopy, no S3 or significant systolic murmur, no pericardial rub.  Abdomen: Soft, nontender, bowel sounds present.  Extremities: No pitting edema, distal pulses 2+.    Problem List and Plan   CAD, NATIVE VESSEL No active angina symptoms. Plan to continue medical therapy and observation.  Atrial fibrillation Paroxysmal. He is in sinus rhythm by ECG today. Continues on Lopressor and Xarelto.  Essential hypertension, benign Blood pressure control is good today. No changes made.    Satira Sark, M.D., F.A.C.C.

## 2013-03-14 NOTE — Assessment & Plan Note (Signed)
Paroxysmal. He is in sinus rhythm by ECG today. Continues on Lopressor and Xarelto.

## 2013-03-17 ENCOUNTER — Ambulatory Visit (INDEPENDENT_AMBULATORY_CARE_PROVIDER_SITE_OTHER): Payer: Medicare Other | Admitting: Gastroenterology

## 2013-03-17 ENCOUNTER — Encounter: Payer: Self-pay | Admitting: Gastroenterology

## 2013-03-17 VITALS — BP 135/80 | HR 63 | Temp 97.6°F | Ht 69.0 in | Wt 184.4 lb

## 2013-03-17 DIAGNOSIS — K625 Hemorrhage of anus and rectum: Secondary | ICD-10-CM

## 2013-03-17 NOTE — Progress Notes (Signed)
  Patient refused to see me today. Appointment cancelled. Patient needs appointment with RMR only.  Amitiza 91mcg samples provided.

## 2013-04-26 ENCOUNTER — Encounter: Payer: Self-pay | Admitting: Internal Medicine

## 2013-04-26 ENCOUNTER — Encounter (INDEPENDENT_AMBULATORY_CARE_PROVIDER_SITE_OTHER): Payer: Self-pay

## 2013-04-26 ENCOUNTER — Ambulatory Visit (INDEPENDENT_AMBULATORY_CARE_PROVIDER_SITE_OTHER): Payer: Medicare Other | Admitting: Internal Medicine

## 2013-04-26 VITALS — BP 113/70 | HR 73 | Temp 98.4°F | Ht 69.0 in | Wt 186.2 lb

## 2013-04-26 DIAGNOSIS — I2510 Atherosclerotic heart disease of native coronary artery without angina pectoris: Secondary | ICD-10-CM

## 2013-04-26 DIAGNOSIS — K219 Gastro-esophageal reflux disease without esophagitis: Secondary | ICD-10-CM

## 2013-04-26 DIAGNOSIS — K648 Other hemorrhoids: Secondary | ICD-10-CM

## 2013-04-26 NOTE — Progress Notes (Signed)
Primary Care Physician:  Ignatius Specking., MD Primary Gastroenterologist:  Dr. Jena Gauss  Pre-Procedure History & Physical: HPI:  Brett Matthews is a 80 y.o. male here for of GERD, rectal bleeding and iron deficiency anemia. Iron deficiency anemia has been worked up thoroughly through this office with EGD and colonoscopy. Capsule study a couple of years ago. Paper hematochezia felt to be secondary to hemorrhoids. He is anticoagulated for atrial fibrillation. Lately, he has done very well and has had only scant episodes of paper hematochezia on occasion. He does get involved in heavy lifting at his garage. Occasionally he may have to lift a 200 pound truck tire to change it.   No associated abdominal pain or melena.  GERD symptoms well controlled with Protonix 40 mg daily  Past Medical History  Diagnosis Date  . Gastroesophageal reflux disease   . Paroxysmal atrial fibrillation     Diagnosed 2005  . Neoplasm of lymphatic and hematopoietic tissue   . Essential hypertension, benign   . Anemia, iron deficiency     Negative capsule endoscopy  . Coronary atherosclerosis of native coronary artery     60 % circumflex stenosis; EF 65%, Cath 6/13  . Hypothyroidism 2003    Status post total thyroidectomy  . Dyspnea     Normal cardiopulmonary function test in July 2008, negative echocardiogram with "bubble" study form inter-cardiac shunt.  . DM (diabetes mellitus), type 2, uncontrolled   . Migraines   . Skin cancer   . Arthritis   . Hyperplastic colon polyp 04/06/2007  . Diverticula of colon     pancolonic    Past Surgical History  Procedure Laterality Date  . Nissen fundoplication    . Bone marrow biopsy  1990's  . Back surgery    . Posterior laminectomy / decompression lumbar spine  ~ 1990's  . Skin cancer excision      "off my back and chest; from sun"  . Esophagogastroduodenoscopy  04/06/2007    Dr. Jena Gauss- normal esophagus s/p nissen fundoplication, intact nissen wrap o/w normal stomach  .  Colonoscopy  04/06/2007    Dr. Jena Gauss- Normal rectum with scattered pancolonic diverticula and slightly redundant elongated colon, diminutive polpy midsigmoid, remainder of colonic mucosa appeared normal. bx= hyperplastic polyp  . Colonoscopy N/A 11/29/2012    JBY:[id number] preparation. Friable anal canal/internal hemorrhoids; otherwise, normal rectum. Normal-appearing colonic mucosa  . Esophagogastroduodenoscopy N/A 11/29/2012    RMR: Abnormall distal esophagus bx c/w GERD. prior fundoplication. Gastric polyps  -bx benign. Status post biopsy of normal--appearing duodenal mucosa (bx neg)    Prior to Admission medications   Medication Sig Start Date End Date Taking? Authorizing Provider  amLODipine (NORVASC) 10 MG tablet Take 10 mg by mouth daily.     Yes Historical Provider, MD  atorvastatin (LIPITOR) 40 MG tablet take 1 tablet by mouth once daily 12/07/12  Yes Jonelle Sidle, MD  Azelastine HCl 0.15 % SOLN Place 1 spray into the nose as needed. 01/12/13  Yes Historical Provider, MD  benazepril (LOTENSIN) 5 MG tablet Take 5 mg by mouth daily.   Yes Historical Provider, MD  clonazePAM (KLONOPIN) 0.5 MG tablet Take 0.25 mg by mouth as needed. 03/05/11  Yes Historical Provider, MD  diclofenac (VOLTAREN) 75 MG EC tablet Take 1 tablet by mouth daily.  07/04/10  Yes Historical Provider, MD  dutasteride (AVODART) 0.5 MG capsule Take 0.5 mg by mouth daily. 02/28/11  Yes Historical Provider, MD  DYMISTA 137-50 MCG/ACT SUSP Place 1 spray into the  nose daily.  09/07/12  Yes Historical Provider, MD  finasteride (PROSCAR) 5 MG tablet Take 5 mg by mouth daily.     Yes Historical Provider, MD  folic acid (FOLVITE) 1 MG tablet Take 1 mg by mouth daily.     Yes Historical Provider, MD  glucosamine-chondroitin 500-400 MG tablet Take 1 tablet by mouth 2 (two) times daily.     Yes Historical Provider, MD  levothyroxine (SYNTHROID, LEVOTHROID) 125 MCG tablet Take 125 mcg by mouth daily before breakfast.   Yes Historical  Provider, MD  lubiprostone (AMITIZA) 24 MCG capsule Take 24 mcg by mouth daily.   Yes Historical Provider, MD  Magnesium 250 MG TABS Take 1 tablet by mouth daily.   Yes Historical Provider, MD  metFORMIN (GLUCOPHAGE) 500 MG tablet Take 1 tablet (500 mg total) by mouth 2 (two) times daily with a meal. HOLD 48 hours, restart on 06/30/2011. 06/27/11  Yes Rhonda G Barrett, PA-C  metoprolol tartrate (LOPRESSOR) 25 MG tablet take 1 tablet by mouth twice a day 12/21/12  Yes Satira Sark, MD  pantoprazole (PROTONIX) 40 MG tablet Take 40 mg by mouth daily.     Yes Historical Provider, MD  potassium chloride (MICRO-K) 10 MEQ CR capsule Take 10 mEq by mouth daily.   Yes Historical Provider, MD  Rivaroxaban (XARELTO) 20 MG TABS Take 1 tablet (20 mg total) by mouth daily with supper. 12/16/11  Yes Donney Dice, PA-C  tamsulosin (FLOMAX) 0.4 MG CAPS capsule Take 0.4 mg by mouth daily after supper.  09/09/12  Yes Historical Provider, MD    Allergies as of 04/26/2013  . (No Known Allergies)    Family History  Problem Relation Age of Onset  . Cancer    . Stroke      History   Social History  . Marital Status: Widowed    Spouse Name: N/A    Number of Children: N/A  . Years of Education: N/A   Occupational History  . Full Time    Social History Main Topics  . Smoking status: Never Smoker   . Smokeless tobacco: Never Used  . Alcohol Use: No  . Drug Use: No  . Sexual Activity: No   Other Topics Concern  . Not on file   Social History Narrative  . No narrative on file    Review of Systems: See HPI, otherwise negative ROS  Physical Exam: BP 113/70  Pulse 73  Temp(Src) 98.4 F (36.9 C) (Oral)  Ht $R'5\' 9"'ge$  (1.753 m)  Wt 186 lb 3.2 oz (84.46 kg)  BMI 27.48 kg/m2 General:   Alert,  Well-developed, well-nourished, pleasant and cooperative in NAD Skin:  Intact without significant lesions or rashes. Eyes:  Sclera clear, no icterus.   Conjunctiva pink. Ears:  Normal auditory  acuity. Nose:  No deformity, discharge,  or lesions. Mouth:  No deformity or lesions. Neck:  Supple; no masses or thyromegaly. No significant cervical adenopathy. Lungs:  Clear throughout to auscultation.   No wheezes, crackles, or rhonchi. No acute distress. Heart:  Regular rate and rhythm; no murmurs, clicks, rubs,  or gallops. Abdomen: Non-distended, normal bowel sounds.  Soft and nontender without appreciable mass or hepatosplenomegaly.  Pulses:  Normal pulses noted. Extremities:  Without clubbing or edema.  Impression:  Patient is a pleasant 80 year old gentleman GERD -  well controlled on Protonix 40 mg daily -  status post distant Nissen fundoplication.  History of iron deficiency anemia and paper hematochezia. IDA has been thoroughly investigated  previously. I suspect anorectal bleeding resulting paper hematochezia. Really, these days, patient states bleeding episodes are few and far between and minimal when they occur. The episodes may be related to straining and  likely secondary to benign anorectal pathology (hemorrhoids).  He does not feel he needs any specific therapy for hemorrhoids at this time. I agree.  I do not believe banding or any other specific therapy for hemorrhoids is warranted at this time. Such an approach would be problematic having to come off anticoagulation for about 10 days. The risks of this approach would not worth the benefit at this time.  I recommend he continue Protonix 40 mg daily. I also recommend he continue reasonable good intake of fiber daily and avoid straining particularly in the way of heavy lifting.  Plan for this nice gentleman to come back to see me in 6 months.

## 2013-04-26 NOTE — Patient Instructions (Signed)
Continue Protonix 40 mg daily  Avoid straining.  Schedule followup appointment in 6 months  Hemorrhoid information provided

## 2013-06-17 ENCOUNTER — Telehealth: Payer: Self-pay | Admitting: Internal Medicine

## 2013-06-17 NOTE — Telephone Encounter (Signed)
Patient is asking for samples of Linzess he has a f/u appointment on 06/26 please advise?

## 2013-06-20 NOTE — Telephone Encounter (Signed)
Dr. Gala Romney, is it ok to give this pt samples of Linzess? Per the medication list at his last ov, he was taking amitiza. You did give him some linzess samples in February.

## 2013-06-21 NOTE — Telephone Encounter (Signed)
It will be okay

## 2013-06-22 NOTE — Telephone Encounter (Signed)
Samples are at the front desk. Tried to call pt- LMOM

## 2013-07-01 ENCOUNTER — Encounter (INDEPENDENT_AMBULATORY_CARE_PROVIDER_SITE_OTHER): Payer: Self-pay

## 2013-07-01 ENCOUNTER — Ambulatory Visit (INDEPENDENT_AMBULATORY_CARE_PROVIDER_SITE_OTHER): Payer: Medicare Other | Admitting: Internal Medicine

## 2013-07-01 ENCOUNTER — Encounter: Payer: Self-pay | Admitting: Internal Medicine

## 2013-07-01 VITALS — BP 135/85 | HR 85 | Temp 97.0°F | Ht 70.0 in | Wt 184.4 lb

## 2013-07-01 DIAGNOSIS — K640 First degree hemorrhoids: Secondary | ICD-10-CM

## 2013-07-01 DIAGNOSIS — I2510 Atherosclerotic heart disease of native coronary artery without angina pectoris: Secondary | ICD-10-CM

## 2013-07-01 DIAGNOSIS — K649 Unspecified hemorrhoids: Secondary | ICD-10-CM

## 2013-07-01 DIAGNOSIS — K5900 Constipation, unspecified: Secondary | ICD-10-CM

## 2013-07-01 NOTE — Patient Instructions (Signed)
Avoid straining.  Benefiber 2 teaspoons twice daily  Limit toilet time to 2-3 minutes  Continue Linzess 290 everyday   Call with any interim problems  Schedule followup appointment in 2-3 weeks from now  Office visit in 6 weeks

## 2013-07-01 NOTE — Progress Notes (Signed)
Primary Care Physician:  Glenda Chroman., MD Primary Gastroenterologist:  Dr. Gala Romney  Pre-Procedure History & Physical: HPI:  Brett Matthews is a 80 y.o. male here for followup constipation. Flare of constipation and hemorrhoids.C/O  bulging hemorrhoids particularly when he has had to strain. Alternates between [name] depending on which medication is available.. Not taking a fiber supplement. Once his hemorrhoids "fixed". Continues taking Xarelto. No laxative. GERD well-controlled on Protonix.  Past Medical History  Diagnosis Date  . Gastroesophageal reflux disease   . Paroxysmal atrial fibrillation     Diagnosed 2005  . Neoplasm of lymphatic and hematopoietic tissue   . Essential hypertension, benign   . Anemia, iron deficiency     Negative capsule endoscopy  . Coronary atherosclerosis of native coronary artery     60 % circumflex stenosis; EF 65%, Cath 6/13  . Hypothyroidism 2003    Status post total thyroidectomy  . Dyspnea     Normal cardiopulmonary function test in July 2008, negative echocardiogram with "bubble" study form inter-cardiac shunt.  . DM (diabetes mellitus), type 2, uncontrolled   . Migraines   . Skin cancer   . Arthritis   . Hyperplastic colon polyp 04/06/2007  . Diverticula of colon     pancolonic    Past Surgical History  Procedure Laterality Date  . Nissen fundoplication    . Bone marrow biopsy  1990's  . Back surgery    . Posterior laminectomy / decompression lumbar spine  ~ 1990's  . Skin cancer excision      "off my back and chest; from sun"  . Esophagogastroduodenoscopy  04/06/2007    Dr. Gala Romney- normal esophagus s/p nissen fundoplication, intact nissen wrap o/w normal stomach  . Colonoscopy  04/06/2007    Dr. Gala Romney- Normal rectum with scattered pancolonic diverticula and slightly redundant elongated colon, diminutive polpy midsigmoid, remainder of colonic mucosa appeared normal. bx= hyperplastic polyp  . Colonoscopy N/A 11/29/2012   [id number] preparation. Friable anal canal/internal hemorrhoids; otherwise, normal rectum. Normal-appearing colonic mucosa  . Esophagogastroduodenoscopy N/A 11/29/2012    RMR: Abnormall distal esophagus bx c/w GERD. prior fundoplication. Gastric polyps  -bx benign. Status post biopsy of normal--appearing duodenal mucosa (bx neg)    Prior to Admission medications   Medication Sig Start Date End Date Taking? Authorizing Provider  amLODipine (NORVASC) 10 MG tablet Take 10 mg by mouth daily.     Yes Historical Provider, MD  atorvastatin (LIPITOR) 40 MG tablet take 1 tablet by mouth once daily 12/07/12  Yes Satira Sark, MD  Azelastine HCl 0.15 % SOLN Place 1 spray into the nose as needed. 01/12/13  Yes Historical Provider, MD  benazepril (LOTENSIN) 5 MG tablet Take 5 mg by mouth daily.   Yes Historical Provider, MD  clonazePAM (KLONOPIN) 0.5 MG tablet Take 0.25 mg by mouth as needed. 03/05/11  Yes Historical Provider, MD  diclofenac (VOLTAREN) 75 MG EC tablet Take 1 tablet by mouth daily.  07/04/10  Yes Historical Provider, MD  dutasteride (AVODART) 0.5 MG capsule Take 0.5 mg by mouth daily. 02/28/11  Yes Historical Provider, MD  DYMISTA 137-50 MCG/ACT SUSP Place 1 spray into the nose daily.  09/07/12  Yes Historical Provider, MD  finasteride (PROSCAR) 5 MG tablet Take 5 mg by mouth daily.     Yes Historical Provider, MD  folic acid (FOLVITE) 1 MG tablet Take 1 mg by mouth daily.     Yes Historical Provider, MD  glucosamine-chondroitin 500-400 MG tablet Take 1  tablet by mouth 2 (two) times daily.     Yes Historical Provider, MD  lubiprostone (AMITIZA) 24 MCG capsule Take 24 mcg by mouth daily.   Yes Historical Provider, MD  Magnesium 250 MG TABS Take 1 tablet by mouth daily.   Yes Historical Provider, MD  metFORMIN (GLUCOPHAGE) 500 MG tablet Take 1 tablet (500 mg total) by mouth 2 (two) times daily with a meal. HOLD 48 hours, restart on 06/30/2011. 06/27/11  Yes Rhonda G Barrett, PA-C  metoprolol  tartrate (LOPRESSOR) 25 MG tablet take 1 tablet by mouth twice a day 12/21/12  Yes Satira Sark, MD  pantoprazole (PROTONIX) 40 MG tablet Take 40 mg by mouth daily.     Yes Historical Provider, MD  potassium chloride (MICRO-K) 10 MEQ CR capsule Take 10 mEq by mouth daily.   Yes Historical Provider, MD  Rivaroxaban (XARELTO) 20 MG TABS Take 1 tablet (20 mg total) by mouth daily with supper. 12/16/11  Yes Donney Dice, PA-C  tamsulosin (FLOMAX) 0.4 MG CAPS capsule Take 0.4 mg by mouth daily after supper.  09/09/12  Yes Historical Provider, MD  levothyroxine (SYNTHROID, LEVOTHROID) 125 MCG tablet Take 125 mcg by mouth daily before breakfast.    Historical Provider, MD    Allergies as of 07/01/2013  . (No Known Allergies)    Family History  Problem Relation Age of Onset  . Cancer    . Stroke      History   Social History  . Marital Status: Widowed    Spouse Name: N/A    Number of Children: N/A  . Years of Education: N/A   Occupational History  . Full Time    Social History Main Topics  . Smoking status: Never Smoker   . Smokeless tobacco: Never Used  . Alcohol Use: No  . Drug Use: No  . Sexual Activity: No   Other Topics Concern  . Not on file   Social History Narrative  . No narrative on file    Review of Systems: See HPI, otherwise negative ROS  Physical Exam: BP 135/85  Pulse 85  Temp(Src) 97 F (36.1 C) (Oral)  Ht $R'5\' 10"'HD$  (1.778 m)  Wt 184 lb 6.4 oz (83.643 kg)  BMI 26.46 kg/m2 General:   Alert,  Well-developed, well-nourished, pleasant and cooperative in NAD Skin:  Intact without significant lesions or rashes. Eyes:  Sclera clear, no icterus.   Conjunctiva pink. Ears:  Normal auditory acuity. Nose:  No deformity, discharge,  or lesions. Mouth:  No deformity or lesions. Neck:  Supple; no masses or thyromegaly. No significant cervical adenopathy. Lungs:  Clear throughout to auscultation.   No wheezes, crackles, or rhonchi. No acute distress. Heart:   Regular rate and rhythm; no murmurs, clicks, rubs,  or gallops. Abdomen: Non-distended, normal bowel sounds.  Soft and nontender without appreciable mass or hepatosplenomegaly.  Pulses:  Normal pulses noted. Extremities:  Without clubbing or edema.  Impression: Known symptomatic hemorrhoids. Constipation. Want hemorrhoids "fixed". I could offer hemorrhoid banding, however, thrombo- anticoagulation is problematic. We would have to interrupt anticoagulation for about 7 or 8 days. We discussed the risk of thromboembolic events while off anticoagulation.  I strongly recommended that we take a conservative approach and manage bowel function, hemorrhoids with  fiber supplementation, laxatives and behavioral modification. He agrees.  Avoid straining.  Benefiber 2 teaspoons twice daily  Limit toilet time to 2-3 minutes  Continue Linzess 290 everyday   Call with any interim problems  Schedule followup  appointment in 2-3 weeks from now  Office visit in 6 weeks      Notice: This dictation was prepared with Dragon dictation along with smaller phrase technology. Any transcriptional errors that result from this process are unintentional and may not be corrected upon review.

## 2013-07-28 ENCOUNTER — Telehealth: Payer: Self-pay | Admitting: Internal Medicine

## 2013-07-28 NOTE — Telephone Encounter (Signed)
#  4 Linzess samples at the front desk. Pt is aware.

## 2013-07-28 NOTE — Telephone Encounter (Signed)
Pt was asking if he could get some Linzess samples. Please call [phone]

## 2013-07-28 NOTE — Telephone Encounter (Signed)
Noted  

## 2013-09-15 ENCOUNTER — Encounter: Payer: Self-pay | Admitting: Internal Medicine

## 2013-09-16 ENCOUNTER — Telehealth: Payer: Self-pay | Admitting: Internal Medicine

## 2013-09-16 NOTE — Telephone Encounter (Signed)
PATIENT WANTING SAMPLES OF LINZESS [place] [phone]

## 2013-09-19 NOTE — Telephone Encounter (Signed)
Linzess 290 mcg #12 at front for pick up and pt is aware.

## 2013-09-20 NOTE — Telephone Encounter (Signed)
Noted  

## 2013-09-21 ENCOUNTER — Encounter: Payer: Medicare Other | Admitting: Cardiology

## 2013-09-21 ENCOUNTER — Encounter: Payer: Self-pay | Admitting: Cardiology

## 2013-09-21 NOTE — Progress Notes (Signed)
No show  This encounter was created in error - please disregard.

## 2013-09-30 ENCOUNTER — Ambulatory Visit (INDEPENDENT_AMBULATORY_CARE_PROVIDER_SITE_OTHER): Payer: Medicare Other | Admitting: Cardiology

## 2013-09-30 ENCOUNTER — Encounter: Payer: Self-pay | Admitting: Cardiology

## 2013-09-30 VITALS — BP 126/74 | HR 72 | Ht 69.0 in | Wt 181.0 lb

## 2013-09-30 DIAGNOSIS — I2510 Atherosclerotic heart disease of native coronary artery without angina pectoris: Secondary | ICD-10-CM

## 2013-09-30 MED ORDER — RIVAROXABAN 20 MG PO TABS: 20.0000 mg | ORAL_TABLET | Freq: Every day | ORAL | Status: DC

## 2013-09-30 NOTE — Patient Instructions (Signed)

## 2013-09-30 NOTE — Assessment & Plan Note (Signed)
No active angina symptoms, continue current medical regimen.

## 2013-09-30 NOTE — Assessment & Plan Note (Signed)
No change antihypertensive regimen.

## 2013-09-30 NOTE — Assessment & Plan Note (Signed)
Symptomatically well controlled. Continue beta blocker and Xarelto. Request most recent lab work from Dr. Woody Seller.

## 2013-09-30 NOTE — Progress Notes (Signed)
Clinical Summary Brett Matthews is a 80 y.o.male last seen in March. He still works in Public relations account executive. Reports generally feeling well most of the time. He has had no chest pain or palpitations. Continues to follow with Brett Matthews for lab work. No reported bleeding problems on Xarelto.  He also follows with Brett Matthews for GI assessment.  ECG from March showed sinus rhythm.   No Known Allergies  Current Outpatient Prescriptions  Medication Sig Dispense Refill  . amLODipine (NORVASC) 10 MG tablet Take 10 mg by mouth daily.        Marland Kitchen atorvastatin (LIPITOR) 40 MG tablet take 1 tablet by mouth once daily  90 tablet  3  . Azelastine HCl 0.15 % SOLN Place 1 spray into the nose as needed.      . benazepril (LOTENSIN) 5 MG tablet Take 5 mg by mouth daily.      . clonazePAM (KLONOPIN) 0.5 MG tablet Take 0.25 mg by mouth as needed.      . diclofenac (VOLTAREN) 75 MG EC tablet Take 1 tablet by mouth daily.       Marland Kitchen dutasteride (AVODART) 0.5 MG capsule Take 0.5 mg by mouth daily.      Marland Kitchen DYMISTA 137-50 MCG/ACT SUSP Place 1 spray into the nose daily.       . finasteride (PROSCAR) 5 MG tablet Take 5 mg by mouth daily.        . folic acid (FOLVITE) 1 MG tablet Take 1 mg by mouth daily.        Marland Kitchen glucosamine-chondroitin 500-400 MG tablet Take 1 tablet by mouth 2 (two) times daily.        Marland Kitchen levothyroxine (SYNTHROID, LEVOTHROID) 125 MCG tablet Take 125 mcg by mouth daily before breakfast.      . lubiprostone (AMITIZA) 24 MCG capsule Take 24 mcg by mouth daily.      . Magnesium 250 MG TABS Take 1 tablet by mouth daily.      . metFORMIN (GLUCOPHAGE) 500 MG tablet Take 1 tablet (500 mg total) by mouth 2 (two) times daily with a meal. HOLD 48 hours, restart on 06/30/2011.      . metoprolol tartrate (LOPRESSOR) 25 MG tablet take 1 tablet by mouth twice a day  180 tablet  3  . pantoprazole (PROTONIX) 40 MG tablet Take 40 mg by mouth daily.        . potassium chloride (MICRO-K) 10 MEQ CR capsule Take  10 mEq by mouth daily.      . rivaroxaban (XARELTO) 20 MG TABS tablet Take 1 tablet (20 mg total) by mouth daily with supper.  15 tablet  0  . tamsulosin (FLOMAX) 0.4 MG CAPS capsule Take 0.4 mg by mouth daily after supper.        No current facility-administered medications for this visit.    Past Medical History  Diagnosis Date  . Gastroesophageal reflux disease   . Paroxysmal atrial fibrillation     Diagnosed 2005  . Neoplasm of lymphatic and hematopoietic tissue   . Essential hypertension, benign   . Anemia, iron deficiency     Negative capsule endoscopy  . Coronary atherosclerosis of native coronary artery     60 % circumflex stenosis; EF 65%, Cath 6/13  . Hypothyroidism 2003    Status post total thyroidectomy  . Dyspnea     Normal cardiopulmonary function test in July 2008, negative echocardiogram with "bubble" study for inter-cardiac shunt.  . DM (diabetes mellitus),  type 2, uncontrolled   . Migraines   . Skin cancer   . Arthritis   . Hyperplastic colon polyp 04/06/2007  . Diverticula of colon     Pancolonic    Social History Brett Matthews reports that he has never smoked. He has never used smokeless tobacco. Brett Matthews reports that he does not drink alcohol.  Review of Systems Having some tooth pain, going to the dentist later today. Also recent upper respiratory tract infection. Other systems reviewed and negative.  Physical Examination Filed Vitals:   09/30/13 1153  BP: 126/74  Pulse: 72   Filed Weights   09/30/13 1153  Weight: 181 lb (82.101 kg)    No acute distress.  HEENT: Conjunctiva and lids normal, oropharynx clear.  Neck: Supple, no elevated JVP or carotid bruits, no thyromegaly.  Lungs: Clear to auscultation, nonlabored breathing at rest.  Cardiac: Regular rate and rhythm with frequent ectopy, no S3 or significant systolic murmur, no pericardial rub.  Abdomen: Soft, nontender, bowel sounds present.  Extremities: No pitting edema, distal pulses 2+.      Problem List and Plan   Paroxysmal atrial fibrillation Symptomatically well controlled. Continue beta blocker and Xarelto. Request most recent lab work from Brett Matthews.  Essential hypertension, benign No change antihypertensive regimen.  CAD, NATIVE VESSEL No active angina symptoms, continue current medical regimen.    Brett Matthews, M.D., F.A.C.C.

## 2013-10-13 ENCOUNTER — Telehealth: Payer: Self-pay | Admitting: Internal Medicine

## 2013-10-13 NOTE — Telephone Encounter (Signed)
PATIENT CALLED WANTING SAMPLES OF LINZESS. PLEASE CALL HIM

## 2013-10-20 ENCOUNTER — Telehealth: Payer: Self-pay | Admitting: Internal Medicine

## 2013-10-20 NOTE — Telephone Encounter (Signed)
Patient said he needed some Linzess samples and he needed all we could give him because it was so expensive. Pt has OV coming up on 11/3. Please call [phone]

## 2013-10-21 NOTE — Telephone Encounter (Signed)
See other phone note

## 2013-10-21 NOTE — Telephone Encounter (Signed)
Gave him #4 boxes of linzess 263mcg.

## 2013-10-21 NOTE — Telephone Encounter (Signed)
Noted  

## 2013-11-08 ENCOUNTER — Ambulatory Visit (INDEPENDENT_AMBULATORY_CARE_PROVIDER_SITE_OTHER): Payer: Medicare Other | Admitting: Internal Medicine

## 2013-11-08 ENCOUNTER — Encounter: Payer: Self-pay | Admitting: Internal Medicine

## 2013-11-08 VITALS — BP 134/75 | HR 72 | Temp 98.9°F | Ht 70.0 in | Wt 188.0 lb

## 2013-11-08 DIAGNOSIS — K219 Gastro-esophageal reflux disease without esophagitis: Secondary | ICD-10-CM

## 2013-11-08 DIAGNOSIS — K921 Melena: Secondary | ICD-10-CM

## 2013-11-08 DIAGNOSIS — I2510 Atherosclerotic heart disease of native coronary artery without angina pectoris: Secondary | ICD-10-CM

## 2013-11-08 DIAGNOSIS — K5909 Other constipation: Secondary | ICD-10-CM

## 2013-11-08 NOTE — Patient Instructions (Addendum)
Continue Benefiber 2 teaspoons twice daily  Continue Linzess 290 daily   Office visit in 6 months

## 2013-11-08 NOTE — Progress Notes (Signed)
Primary Care Physician:  Glenda Chroman., MD Primary Gastroenterologist:  Dr. Gala Romney  Pre-Procedure History & Physical: HPI:  Brett Matthews is a 80 y.o. male here for for followup of constipation hematochezia. He still need lens S2 9 he is working very well, managing his constipation. Along with this, he notes his rectal bleeding has subsided. He continues on Xarelto for atrial fibrillation..  Reflux symptoms well controlled on Protonix.  Past Medical History  Diagnosis Date  . Gastroesophageal reflux disease   . Paroxysmal atrial fibrillation     Diagnosed 2005  . Neoplasm of lymphatic and hematopoietic tissue   . Essential hypertension, benign   . Anemia, iron deficiency     Negative capsule endoscopy  . Coronary atherosclerosis of native coronary artery     60 % circumflex stenosis; EF 65%, Cath 6/13  . Hypothyroidism 2003    Status post total thyroidectomy  . Dyspnea     Normal cardiopulmonary function test in July 2008, negative echocardiogram with "bubble" study for inter-cardiac shunt.  . DM (diabetes mellitus), type 2, uncontrolled   . Migraines   . Skin cancer   . Arthritis   . Hyperplastic colon polyp 04/06/2007  . Diverticula of colon     Pancolonic  . Hemorrhoids     Past Surgical History  Procedure Laterality Date  . Nissen fundoplication    . Bone marrow biopsy  1990's  . Back surgery    . Posterior laminectomy / decompression lumbar spine  ~ 1990's  . Skin cancer excision      "off my back and chest; from sun"  . Esophagogastroduodenoscopy  04/06/2007    Dr. Gala Romney- normal esophagus s/p nissen fundoplication, intact nissen wrap o/w normal stomach  . Colonoscopy  04/06/2007    Dr. Gala Romney- Normal rectum with scattered pancolonic diverticula and slightly redundant elongated colon, diminutive polpy midsigmoid, remainder of colonic mucosa appeared normal. bx= hyperplastic polyp  . Colonoscopy N/A 11/29/2012    [id number] preparation. Friable anal canal/internal  hemorrhoids; otherwise, normal rectum. Normal-appearing colonic mucosa  . Esophagogastroduodenoscopy N/A 11/29/2012    RMR: Abnormall distal esophagus bx c/w GERD. prior fundoplication. Gastric polyps  -bx benign. Status post biopsy of normal--appearing duodenal mucosa (bx neg)    Prior to Admission medications   Medication Sig Start Date End Date Taking? Authorizing Provider  amLODipine (NORVASC) 10 MG tablet Take 10 mg by mouth daily.     Yes Historical Provider, MD  atorvastatin (LIPITOR) 40 MG tablet take 1 tablet by mouth once daily 12/07/12  Yes Satira Sark, MD  Azelastine HCl 0.15 % SOLN Place 1 spray into the nose as needed. 01/12/13  Yes Historical Provider, MD  benazepril (LOTENSIN) 5 MG tablet Take 5 mg by mouth daily.   Yes Historical Provider, MD  clonazePAM (KLONOPIN) 0.5 MG tablet Take 0.25 mg by mouth as needed. 03/05/11  Yes Historical Provider, MD  diclofenac (VOLTAREN) 75 MG EC tablet Take 1 tablet by mouth daily.  07/04/10  Yes Historical Provider, MD  dutasteride (AVODART) 0.5 MG capsule Take 0.5 mg by mouth daily. 02/28/11  Yes Historical Provider, MD  DYMISTA 137-50 MCG/ACT SUSP Place 1 spray into the nose daily.  09/07/12  Yes Historical Provider, MD  finasteride (PROSCAR) 5 MG tablet Take 5 mg by mouth daily.     Yes Historical Provider, MD  folic acid (FOLVITE) 1 MG tablet Take 1 mg by mouth daily.     Yes Historical Provider, MD  glucosamine-chondroitin 500-400 MG  tablet Take 1 tablet by mouth 2 (two) times daily.     Yes Historical Provider, MD  levothyroxine (SYNTHROID, LEVOTHROID) 125 MCG tablet Take 125 mcg by mouth daily before breakfast.   Yes Historical Provider, MD  lubiprostone (AMITIZA) 24 MCG capsule Take 24 mcg by mouth daily.   Yes Historical Provider, MD  Magnesium 250 MG TABS Take 1 tablet by mouth daily.   Yes Historical Provider, MD  metFORMIN (GLUCOPHAGE) 500 MG tablet Take 1 tablet (500 mg total) by mouth 2 (two) times daily with a meal. HOLD 48 hours,  restart on 06/30/2011. 06/27/11  Yes Rhonda G Barrett, PA-C  metoprolol tartrate (LOPRESSOR) 25 MG tablet take 1 tablet by mouth twice a day 12/21/12  Yes Satira Sark, MD  pantoprazole (PROTONIX) 40 MG tablet Take 40 mg by mouth daily.     Yes Historical Provider, MD  potassium chloride (MICRO-K) 10 MEQ CR capsule Take 10 mEq by mouth daily.   Yes Historical Provider, MD  rivaroxaban (XARELTO) 20 MG TABS tablet Take 1 tablet (20 mg total) by mouth daily with supper. 09/30/13  Yes Satira Sark, MD  tamsulosin (FLOMAX) 0.4 MG CAPS capsule Take 0.4 mg by mouth daily after supper.  09/09/12  Yes Historical Provider, MD    Allergies as of 11/08/2013  . (No Known Allergies)    Family History  Problem Relation Age of Onset  . Cancer    . Stroke      History   Social History  . Marital Status: Widowed    Spouse Name: N/A    Number of Children: N/A  . Years of Education: N/A   Occupational History  . Full Time    Social History Main Topics  . Smoking status: Never Smoker   . Smokeless tobacco: Never Used  . Alcohol Use: No  . Drug Use: No  . Sexual Activity: No   Other Topics Concern  . Not on file   Social History Narrative    Review of Systems: See HPI, otherwise negative ROS  Physical Exam: BP 134/75 mmHg  Pulse 72  Temp(Src) 98.9 F (37.2 C)  Ht $R'5\' 10"'Zu$  (1.778 m)  Wt 188 lb (85.276 kg)  BMI 26.98 kg/m2 General:   Alert,  Well-developed, well-nourished, pleasant and cooperative in NAD Skin:  Intact without significant lesions or rashes. Eyes:  Sclera clear, no icterus.   Conjunctiva pink. Ears:  Normal auditory acuity. Nose:  No deformity, discharge,  or lesions. Mouth:  No deformity or lesions. Neck:  Supple; no masses or thyromegaly. No significant cervical adenopathy. Lungs:  Clear throughout to auscultation.   No wheezes, crackles, or rhonchi. No acute distress. Heart:  Regular rate and rhythm; no murmurs, clicks, rubs,  or gallops. Abdomen:  Non-distended, normal bowel sounds.  Soft and nontender without appreciable mass or hepatosplenomegaly.  Pulses:  Normal pulses noted. Extremities:  Without clubbing or edema.  Impression:  Pleasant 80 year old gentleman with GERD well-controlled on Protonix. Hematochezia most likely secondary to hemorrhoids (based on November 2014 colonoscopy) now resolved with Linzess 290.  He Is not a very good banding candidate secondary to ongoing anticoagulation.   Recommendations:   Continue Benefiber 2 teaspoons twice daily  Continue Linzess 290 daily   Office visit in 6 months   Notice: This dictation was prepared with Dragon dictation along with smaller phrase technology. Any transcriptional errors that result from this process are unintentional and may not be corrected upon review.

## 2013-11-30 ENCOUNTER — Other Ambulatory Visit: Payer: Self-pay | Admitting: *Deleted

## 2013-11-30 MED ORDER — ATORVASTATIN CALCIUM 40 MG PO TABS: 40.0000 mg | ORAL_TABLET | Freq: Every day | ORAL | Status: DC

## 2013-12-08 ENCOUNTER — Telehealth: Payer: Self-pay | Admitting: Internal Medicine

## 2013-12-08 NOTE — Telephone Encounter (Signed)
Patient called asking if he could get samples of Linzess 290. Please advise [phone]

## 2013-12-08 NOTE — Telephone Encounter (Signed)
Samples are at the front desk. Gave #4 boxes of linzess 277mcg.

## 2013-12-09 NOTE — Telephone Encounter (Signed)
Noted  

## 2013-12-15 ENCOUNTER — Encounter (HOSPITAL_COMMUNITY): Payer: Self-pay | Admitting: Cardiovascular Disease

## 2014-03-10 ENCOUNTER — Ambulatory Visit (INDEPENDENT_AMBULATORY_CARE_PROVIDER_SITE_OTHER): Payer: Medicare Other | Admitting: Cardiology

## 2014-03-10 ENCOUNTER — Encounter: Payer: Self-pay | Admitting: Cardiology

## 2014-03-10 VITALS — BP 110/62 | HR 65 | Ht 70.0 in | Wt 188.0 lb

## 2014-03-10 DIAGNOSIS — I2510 Atherosclerotic heart disease of native coronary artery without angina pectoris: Secondary | ICD-10-CM

## 2014-03-10 DIAGNOSIS — E782 Mixed hyperlipidemia: Secondary | ICD-10-CM | POA: Diagnosis not present

## 2014-03-10 DIAGNOSIS — I10 Essential (primary) hypertension: Secondary | ICD-10-CM

## 2014-03-10 DIAGNOSIS — I480 Paroxysmal atrial fibrillation: Secondary | ICD-10-CM

## 2014-03-10 MED ORDER — ATORVASTATIN CALCIUM 20 MG PO TABS: 20.0000 mg | ORAL_TABLET | Freq: Every day | ORAL | Status: DC

## 2014-03-10 MED ORDER — RIVAROXABAN 20 MG PO TABS: 20.0000 mg | ORAL_TABLET | Freq: Every day | ORAL | Status: DC

## 2014-03-10 NOTE — Patient Instructions (Signed)
Your physician recommends that you schedule a follow-up appointment in: 6 months. You will receive a reminder letter in the mail in about 4 months reminding you to call and schedule your appointment. If you don't receive this letter, please contact our office. Your physician has recommended you make the following change in your medication:  Decrease your atorvastatin (lipitor) to 20 mg daily. You may break your 40 mg tablets in half daily until they are finished. Continue all other medications the same.

## 2014-03-10 NOTE — Progress Notes (Signed)
Cardiology Office Note  Date: 03/10/2014   ID: Brett Matthews, DOB 1932-11-15, MRN [id number]  PCP: Glenda Chroman., MD  Primary Cardiologist: Rozann Lesches, MD   Chief Complaint  Patient presents with  . Coronary Artery Disease  . PAF    History of Present Illness: Brett Matthews is a 81 y.o. male last seen in September 2015.  He presents for a routine visit. Continues to work full-time, changes truck tires. Reports no progressive angina symptoms or increasing shortness of breath, beyond NYHA class 1-2.  We reviewed his medications. He remains on a stable cardiac regimen as outlined below. I did discuss with him cutting his Lipitor in half in light of very low cholesterol numbers documented with his last lab work at [place].   He has had no palpitations to suggest significant breakthrough atrial fibrillation. Follow-up tracing is noted below.  He reports no blood spontaneous bleeding episodes on Xarelto.   Past Medical History  Diagnosis Date  . Gastroesophageal reflux disease   . Paroxysmal atrial fibrillation     Diagnosed 2005  . Neoplasm of lymphatic and hematopoietic tissue   . Essential hypertension, benign   . Anemia, iron deficiency     Negative capsule endoscopy  . Coronary atherosclerosis of native coronary artery     60 % circumflex stenosis; EF 65%, Cath 6/13  . Hypothyroidism 2003    Status post total thyroidectomy  . Dyspnea     Normal cardiopulmonary function test in July 2008, negative echocardiogram with "bubble" study for inter-cardiac shunt.  . DM (diabetes mellitus), type 2, uncontrolled   . Migraines   . Skin cancer   . Arthritis   . Hyperplastic colon polyp 04/06/2007  . Diverticula of colon     Pancolonic  . Hemorrhoids     Past Surgical History  Procedure Laterality Date  . Nissen fundoplication    . Bone marrow biopsy  1990's  . Back surgery    . Posterior laminectomy / decompression lumbar spine  ~ 1990's  . Skin  cancer excision      "off my back and chest; from sun"  . Esophagogastroduodenoscopy  04/06/2007    Dr. Gala Romney- normal esophagus s/p nissen fundoplication, intact nissen wrap o/w normal stomach  . Colonoscopy  04/06/2007    Dr. Gala Romney- Normal rectum with scattered pancolonic diverticula and slightly redundant elongated colon, diminutive polpy midsigmoid, remainder of colonic mucosa appeared normal. bx= hyperplastic polyp  . Colonoscopy N/A 11/29/2012    [id number]:[id number] preparation. Friable anal canal/internal hemorrhoids; otherwise, normal rectum. Normal-appearing colonic mucosa  . Esophagogastroduodenoscopy N/A 11/29/2012    RMR: Abnormall distal esophagus bx c/w GERD. prior fundoplication. Gastric polyps  -bx benign. Status post biopsy of normal--appearing duodenal mucosa (bx neg)  . Left heart catheterization with coronary angiogram N/A 06/27/2011    Procedure: LEFT HEART CATHETERIZATION WITH CORONARY ANGIOGRAM;  Surgeon: Thayer Headings, MD;  Location[facility] C[facility] LAB;  Service: Cardiovascular;  Laterality: N/A;    Current Outpatient Prescriptions  Medication Sig Dispense Refill  . amLODipine (NORVASC) 10 MG tablet Take 10 mg by mouth daily.      . Azelastine HCl 0.15 % SOLN Place 1 spray into the nose as needed.    . benazepril (LOTENSIN) 5 MG tablet Take 5 mg by mouth daily.    . clonazePAM (KLONOPIN) 0.5 MG tablet Take 0.25 mg by mouth as needed.    . diclofenac (VOLTAREN) 75 MG EC tablet Take 1 tablet by mouth daily.     Marland Kitchen  dutasteride (AVODART) 0.5 MG capsule Take 0.5 mg by mouth daily.    Marland Kitchen DYMISTA 137-50 MCG/ACT SUSP Place 1 spray into the nose daily.     . finasteride (PROSCAR) 5 MG tablet Take 5 mg by mouth daily.      . folic acid (FOLVITE) 1 MG tablet Take 1 mg by mouth daily.      Marland Kitchen glucosamine-chondroitin 500-400 MG tablet Take 1 tablet by mouth 2 (two) times daily.      Marland Kitchen levothyroxine (SYNTHROID, LEVOTHROID) 125 MCG tablet Take 125 mcg by mouth daily before breakfast.    .  lubiprostone (AMITIZA) 24 MCG capsule Take 24 mcg by mouth daily.    . Magnesium 250 MG TABS Take 1 tablet by mouth daily.    . metFORMIN (GLUCOPHAGE) 500 MG tablet Take 1 tablet (500 mg total) by mouth 2 (two) times daily with a meal. HOLD 48 hours, restart on 06/30/2011.    . metoprolol tartrate (LOPRESSOR) 25 MG tablet take 1 tablet by mouth twice a day 180 tablet 3  . pantoprazole (PROTONIX) 40 MG tablet Take 40 mg by mouth daily.      . potassium chloride (MICRO-K) 10 MEQ CR capsule Take 10 mEq by mouth daily.    . rivaroxaban (XARELTO) 20 MG TABS tablet Take 1 tablet (20 mg total) by mouth daily with supper. 15 tablet 0  . tamsulosin (FLOMAX) 0.4 MG CAPS capsule Take 0.4 mg by mouth daily after supper.     Marland Kitchen atorvastatin (LIPITOR) 20 MG tablet Take 1 tablet (20 mg total) by mouth daily. 90 tablet 3   No current facility-administered medications for this visit.    Allergies:  Review of patient's allergies indicates no known allergies.   Social History: The patient  reports that he has never smoked. He has never used smokeless tobacco. He reports that he does not drink alcohol or use illicit drugs.   ROS:  Please see the history of present illness. Otherwise, complete review of systems is positive for none.  All other systems are reviewed and negative.    Physical Exam: VS:  BP 110/62 mmHg  Pulse 65  Ht $R'5\' 10"'Xr$  (1.778 m)  Wt 188 lb (85.276 kg)  BMI 26.98 kg/m2  SpO2 98%, BMI Body mass index is 26.98 kg/(m^2).  Wt Readings from Last 3 Encounters:  03/10/14 188 lb (85.276 kg)  11/08/13 188 lb (85.276 kg)  09/30/13 181 lb (82.101 kg)     No acute distress.  HEENT: Conjunctiva and lids normal, oropharynx clear.  Neck: Supple, no elevated JVP or carotid bruits, no thyromegaly.  Lungs: Clear to auscultation, nonlabored breathing at rest.  Cardiac: Regular rate and rhythm with frequent ectopy, no S3 or significant systolic murmur, no pericardial rub.  Abdomen: Soft, nontender,  bowel sounds present.  Extremities: No pitting edema, distal pulses 2+.  Skin: Warm and dry. Musculoskeletal: No kyphosis. Neuropsychiatric: Alert and oriented 3, affect appropriate.   ECG: ECG is ordered today and reviewed showing normal sinus rhythm with nonspecific ST-T changes and anteroseptal Q waves.   Recent Labwork:  Lab work from June 2015 showed hemoglobin 12.6, platelets 269, cholesterol 87, triglycerides 81, HDL 45, LDL 25, BUN 15, creatinine 0.8, potassium 4.2, AST 19, ALT 21, hemoglobin A1c 6.7.  Other Studies Reviewed Today:  Echocardiogram from [facility] on 12/20/2012 reported mild LVH with LVEF 42-59%, diastolic dysfunction, mild left atrial enlargement, upper normal RV chamber size, MAC with mild mitral regurgitation, sclerotic aortic valve with trace  aortic regurgitation, mild tricuspid regurgitation with RVSP 25-35 mmHg.  Assessment and Plan:  1.  CAD with moderate circumflex disease based on prior evaluation in 2013, symptomatically stable medical therapy. Continue observation.  2.  Hyperlipidemia, low cholesterol numbers based on lab work from the last 6 months. LDL was 25. I have recommended that he cut his Lipitor down to 20 mg daily.  3.  Essential hypertension, blood pressure is normal today.  4.  Paroxysmal atrial fibrillation, currently in sinus rhythm without significant palpitations. Continue Xarelto.  Current medicines are reviewed at length with the patient today.  The patient does not have concerns regarding medicines.   Orders Placed This Encounter  Procedures  . EKG 12-Lead    Disposition: FU with me in 6 months.   Signed, Satira Sark, MD, [facility] 03/10/2014 2:10 PM    [facility] at [place], [place], [place] [address] Phone: [phone]; Fax: [phone]

## 2014-05-08 ENCOUNTER — Encounter: Payer: Self-pay | Admitting: Internal Medicine

## 2014-06-01 ENCOUNTER — Telehealth: Payer: Self-pay

## 2014-06-01 NOTE — Telephone Encounter (Signed)
Pt came by the office. Gave #4 boxes of linzess 262mcg.

## 2014-07-04 ENCOUNTER — Telehealth: Payer: Self-pay | Admitting: Internal Medicine

## 2014-07-04 ENCOUNTER — Ambulatory Visit: Payer: Medicare Other | Admitting: Internal Medicine

## 2014-07-04 NOTE — Telephone Encounter (Signed)
Pt was a no show

## 2014-08-08 ENCOUNTER — Telehealth: Payer: Self-pay

## 2014-08-08 NOTE — Telephone Encounter (Signed)
Pt came by the office for linzess 219mcg samples. I gave him #5 boxes and I informed him that he missed his last ov and that he would have to be seen in the office before I could give him any more samples. Pt has an office visit for 09/05/14 with RMR. Pt stated he would come to that ov.   Routing to RMR for an FYI.

## 2014-08-22 NOTE — Telephone Encounter (Signed)
Noted  

## 2014-08-22 NOTE — Telephone Encounter (Signed)
Patient came by and stated he wanted more samples of Linzess.  I told the patient that he needed to get his Rx for Linzess.  He said the medication was very expensive and he is also getting samples from his pcp.   Routing to RMR as a [employer]

## 2014-09-05 ENCOUNTER — Ambulatory Visit (INDEPENDENT_AMBULATORY_CARE_PROVIDER_SITE_OTHER): Payer: Medicare Other | Admitting: Internal Medicine

## 2014-09-05 ENCOUNTER — Encounter: Payer: Self-pay | Admitting: Internal Medicine

## 2014-09-05 VITALS — BP 145/85 | HR 61 | Temp 97.6°F | Ht 70.0 in | Wt 202.0 lb

## 2014-09-05 DIAGNOSIS — K219 Gastro-esophageal reflux disease without esophagitis: Secondary | ICD-10-CM

## 2014-09-05 DIAGNOSIS — I2510 Atherosclerotic heart disease of native coronary artery without angina pectoris: Secondary | ICD-10-CM

## 2014-09-05 DIAGNOSIS — K5900 Constipation, unspecified: Secondary | ICD-10-CM

## 2014-09-05 NOTE — Patient Instructions (Signed)
Continue Protonix 40 mg daily  Continue Linzess 290 daily  Office visit in 6 months

## 2014-09-05 NOTE — Progress Notes (Signed)
Primary Care Physician:  Ignatius Specking., MD Primary Gastroenterologist:  Dr. Jena Gauss  Pre-Procedure History & Physical: HPI:  Brett Matthews is a 81 y.o. male here for follow-up of GERD and constipation. Continues to do well on Linzess 290 daily (financial constraints prevents him from staying on this medication on a regular basis). GERD symptoms well controlled on Protonix 40 mg daily. Rectal bleeding has tapered off.  Overall, he reports to be doing very well from a GI standpoint.  Past Medical History  Diagnosis Date  . Gastroesophageal reflux disease   . Paroxysmal atrial fibrillation     Diagnosed 2005  . Neoplasm of lymphatic and hematopoietic tissue   . Essential hypertension, benign   . Anemia, iron deficiency     Negative capsule endoscopy  . Coronary atherosclerosis of native coronary artery     60 % circumflex stenosis; EF 65%, Cath 6/13  . Hypothyroidism 2003    Status post total thyroidectomy  . Dyspnea     Normal cardiopulmonary function test in July 2008, negative echocardiogram with "bubble" study for inter-cardiac shunt.  . DM (diabetes mellitus), type 2, uncontrolled   . Migraines   . Skin cancer   . Arthritis   . Hyperplastic colon polyp 04/06/2007  . Diverticula of colon     Pancolonic  . Hemorrhoids     Past Surgical History  Procedure Laterality Date  . Nissen fundoplication    . Bone marrow biopsy  1990's  . Back surgery    . Posterior laminectomy / decompression lumbar spine  ~ 1990's  . Skin cancer excision      "off my back and chest; from sun"  . Esophagogastroduodenoscopy  04/06/2007    Dr. Jena Gauss- normal esophagus s/p nissen fundoplication, intact nissen wrap o/w normal stomach  . Colonoscopy  04/06/2007    Dr. Jena Gauss- Normal rectum with scattered pancolonic diverticula and slightly redundant elongated colon, diminutive polpy midsigmoid, remainder of colonic mucosa appeared normal. bx= hyperplastic polyp  . Colonoscopy N/A 11/29/2012   YSI:[id number] preparation. Friable anal canal/internal hemorrhoids; otherwise, normal rectum. Normal-appearing colonic mucosa  . Esophagogastroduodenoscopy N/A 11/29/2012    RMR: Abnormall distal esophagus bx c/w GERD. prior fundoplication. Gastric polyps  -bx benign. Status post biopsy of normal--appearing duodenal mucosa (bx neg)  . Left heart catheterization with coronary angiogram N/A 06/27/2011    Procedure: LEFT HEART CATHETERIZATION WITH CORONARY ANGIOGRAM;  Surgeon: Vesta Mixer, MD;  Location: [facility] CATH LAB;  Service: Cardiovascular;  Laterality: N/A;    Prior to Admission medications   Medication Sig Start Date End Date Taking? Authorizing Provider  amLODipine (NORVASC) 10 MG tablet Take 10 mg by mouth daily.     Yes Historical Provider, MD  atorvastatin (LIPITOR) 20 MG tablet Take 1 tablet (20 mg total) by mouth daily. 03/10/14  Yes Jonelle Sidle, MD  Azelastine HCl 0.15 % SOLN Place 1 spray into the nose as needed. 01/12/13  Yes Historical Provider, MD  benazepril (LOTENSIN) 5 MG tablet Take 5 mg by mouth daily.   Yes Historical Provider, MD  clonazePAM (KLONOPIN) 0.5 MG tablet Take 0.25 mg by mouth as needed. 03/05/11  Yes Historical Provider, MD  diclofenac (VOLTAREN) 75 MG EC tablet Take 1 tablet by mouth daily.  07/04/10  Yes Historical Provider, MD  dutasteride (AVODART) 0.5 MG capsule Take 0.5 mg by mouth daily. 02/28/11  Yes Historical Provider, MD  DYMISTA 137-50 MCG/ACT SUSP Place 1 spray into the nose daily.  09/07/12  Yes Historical  Provider, MD  finasteride (PROSCAR) 5 MG tablet Take 5 mg by mouth daily.     Yes Historical Provider, MD  folic acid (FOLVITE) 1 MG tablet Take 1 mg by mouth daily.     Yes Historical Provider, MD  glucosamine-chondroitin 500-400 MG tablet Take 1 tablet by mouth 2 (two) times daily.     Yes Historical Provider, MD  levothyroxine (SYNTHROID, LEVOTHROID) 125 MCG tablet Take 125 mcg by mouth daily before breakfast.   Yes Historical Provider, MD    lubiprostone (AMITIZA) 24 MCG capsule Take 24 mcg by mouth daily.   Yes Historical Provider, MD  Magnesium 250 MG TABS Take 1 tablet by mouth daily.   Yes Historical Provider, MD  metFORMIN (GLUCOPHAGE) 500 MG tablet Take 1 tablet (500 mg total) by mouth 2 (two) times daily with a meal. HOLD 48 hours, restart on 06/30/2011. 06/27/11  Yes Rhonda G Barrett, PA-C  metoprolol tartrate (LOPRESSOR) 25 MG tablet take 1 tablet by mouth twice a day 12/21/12  Yes Satira Sark, MD  pantoprazole (PROTONIX) 40 MG tablet Take 40 mg by mouth daily.     Yes Historical Provider, MD  potassium chloride (MICRO-K) 10 MEQ CR capsule Take 10 mEq by mouth daily.   Yes Historical Provider, MD  rivaroxaban (XARELTO) 20 MG TABS tablet Take 1 tablet (20 mg total) by mouth daily with supper. 03/10/14  Yes Satira Sark, MD  tamsulosin (FLOMAX) 0.4 MG CAPS capsule Take 0.4 mg by mouth daily after supper.  09/09/12  Yes Historical Provider, MD    Allergies as of 09/05/2014  . (No Known Allergies)    Family History  Problem Relation Age of Onset  . Cancer    . Stroke      Social History   Social History  . Marital Status: Widowed    Spouse Name: N/A  . Number of Children: N/A  . Years of Education: N/A   Occupational History  . Full Time    Social History Main Topics  . Smoking status: Never Smoker   . Smokeless tobacco: Never Used  . Alcohol Use: No  . Drug Use: No  . Sexual Activity: No   Other Topics Concern  . Not on file   Social History Narrative    Review of Systems: See HPI, otherwise negative ROS  Physical Exam: BP 145/85 mmHg  Pulse 61  Temp(Src) 97.6 F (36.4 C)  Ht $R'5\' 10"'lF$  (1.778 m)  Wt 202 lb (91.627 kg)  BMI 28.98 kg/m2 General:   Alert,  Well-developed, well-nourished, pleasant and cooperative in NAD Lungs:  Clear throughout to auscultation.   No wheezes, crackles, or rhonchi. No acute distress. Heart:  Regular rate and rhythm; no murmurs, clicks, rubs,  or  gallops. Abdomen: Non-distended, normal bowel sounds.  Soft and nontender without appreciable mass or hepatosplenomegaly.  Pulses:  Normal pulses noted. Extremities:  Without clubbing or edema.  Impression:  GERD well-controlled on Protonix 40 mg daily. Rectal bleeding subsided with successful management of constipation. Bleeding likely hemorrhoidal in origin on chronic anticoagulation-improved.  Recommendations:  Continue Protonix 40 mg daily  Continue Linzess 290 daily  Office visit in 6 months    Notice: This dictation was prepared with Dragon dictation along with smaller phrase technology. Any transcriptional errors that result from this process are unintentional and may not be corrected upon review.

## 2014-09-06 ENCOUNTER — Ambulatory Visit (INDEPENDENT_AMBULATORY_CARE_PROVIDER_SITE_OTHER): Payer: Medicare Other | Admitting: Cardiology

## 2014-09-06 ENCOUNTER — Encounter: Payer: Self-pay | Admitting: Cardiology

## 2014-09-06 VITALS — BP 130/64 | HR 72 | Ht 70.0 in | Wt 200.0 lb

## 2014-09-06 DIAGNOSIS — I2510 Atherosclerotic heart disease of native coronary artery without angina pectoris: Secondary | ICD-10-CM | POA: Diagnosis not present

## 2014-09-06 DIAGNOSIS — I480 Paroxysmal atrial fibrillation: Secondary | ICD-10-CM | POA: Diagnosis not present

## 2014-09-06 MED ORDER — RIVAROXABAN 20 MG PO TABS: 20.0000 mg | ORAL_TABLET | Freq: Every day | ORAL | Status: DC

## 2014-09-06 NOTE — Progress Notes (Signed)
Cardiology Office Note  Date: 09/06/2014   ID: Brett Matthews, DOB 1932/02/22, MRN [id number]  PCP: Glenda Chroman., MD  Primary Cardiologist: Rozann Lesches, MD   Chief Complaint  Patient presents with  . Coronary Artery Disease  . PAF    History of Present Illness: Brett Matthews is an 81 y.o. male last seen in March. He presents for a routine follow-up visit today. He reports no angina symptoms. 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 He states that he is planning to work as long as he can. He does have significant problems with hand and knee arthritis.  We reviewed his medications, no change from a cardiac perspective. He reports no significant bleeding problems on Xarelto.  He continues to follow with Dr. Woody Seller.   Past Medical History  Diagnosis Date  . Gastroesophageal reflux disease   . Paroxysmal atrial fibrillation     Diagnosed 2005  . Neoplasm of lymphatic and hematopoietic tissue   . Essential hypertension, benign   . Anemia, iron deficiency     Negative capsule endoscopy  . Coronary atherosclerosis of native coronary artery     60 % circumflex stenosis; EF 65%, Cath 6/13  . Hypothyroidism 2003    Status post total thyroidectomy  . Dyspnea     Normal cardiopulmonary function test in July 2008, negative echocardiogram with "bubble" study for inter-cardiac shunt.  . DM (diabetes mellitus), type 2, uncontrolled   . Migraines   . Skin cancer   . Arthritis   . Hyperplastic colon polyp 04/06/2007  . Diverticula of colon     Pancolonic  . Hemorrhoids     Current Outpatient Prescriptions  Medication Sig Dispense Refill  . amLODipine (NORVASC) 10 MG tablet Take 10 mg by mouth daily.      Marland Kitchen atorvastatin (LIPITOR) 20 MG tablet Take 1 tablet (20 mg total) by mouth daily. 90 tablet 3  . Azelastine HCl 0.15 % SOLN Place 1 spray into the nose as needed.    . benazepril (LOTENSIN) 5 MG tablet Take 5 mg by mouth daily.    . clonazePAM (KLONOPIN) 0.5 MG tablet  Take 0.25 mg by mouth as needed.    . diclofenac (VOLTAREN) 75 MG EC tablet Take 1 tablet by mouth daily.     Marland Kitchen dutasteride (AVODART) 0.5 MG capsule Take 0.5 mg by mouth daily.    Marland Kitchen DYMISTA 137-50 MCG/ACT SUSP Place 1 spray into the nose daily.     . finasteride (PROSCAR) 5 MG tablet Take 5 mg by mouth daily.      . folic acid (FOLVITE) 1 MG tablet Take 1 mg by mouth daily.      Marland Kitchen glucosamine-chondroitin 500-400 MG tablet Take 1 tablet by mouth 2 (two) times daily.      Marland Kitchen levothyroxine (SYNTHROID, LEVOTHROID) 125 MCG tablet Take 125 mcg by mouth daily before breakfast.    . lubiprostone (AMITIZA) 24 MCG capsule Take 24 mcg by mouth daily.    . Magnesium 250 MG TABS Take 1 tablet by mouth daily.    . metFORMIN (GLUCOPHAGE) 500 MG tablet Take 1 tablet (500 mg total) by mouth 2 (two) times daily with a meal. HOLD 48 hours, restart on 06/30/2011.    . metoprolol tartrate (LOPRESSOR) 25 MG tablet take 1 tablet by mouth twice a day 180 tablet 3  . pantoprazole (PROTONIX) 40 MG tablet Take 40 mg by mouth daily.      . potassium chloride (MICRO-K) 10 MEQ CR  capsule Take 10 mEq by mouth daily.    . rivaroxaban (XARELTO) 20 MG TABS tablet Take 1 tablet (20 mg total) by mouth daily with supper. 15 tablet 0  . tamsulosin (FLOMAX) 0.4 MG CAPS capsule Take 0.4 mg by mouth daily after supper.      No current facility-administered medications for this visit.    Allergies:  Review of patient's allergies indicates no known allergies.   Social History: The patient  reports that he has never smoked. He has never used smokeless tobacco. He reports that he does not drink alcohol or use illicit drugs.   ROS:  Please see the history of present illness. Otherwise, complete review of systems is positive for none.  All other systems are reviewed and negative.   Physical Exam: VS:  BP 130/64 mmHg  Pulse 72  Ht 5\' 10"  (1.778 m)  Wt 200 lb (90.719 kg)  BMI 28.70 kg/m2  SpO2 95%, BMI Body mass index is 28.7  kg/(m^2).  Wt Readings from Last 3 Encounters:  09/06/14 200 lb (90.719 kg)  09/05/14 202 lb (91.627 kg)  03/10/14 188 lb (85.276 kg)     No acute distress.  HEENT: Conjunctiva and lids normal, oropharynx clear.  Neck: Supple, no elevated JVP or carotid bruits, no thyromegaly.  Lungs: Clear to auscultation, nonlabored breathing at rest.  Cardiac: Regular rate and rhythm with frequent ectopy, no S3 or significant systolic murmur, no pericardial rub.  Abdomen: Soft, nontender, bowel sounds present.  Extremities: No pitting edema, distal pulses 2+.   ECG: ECG is not ordered today.  Other Studies Reviewed Today:  Echocardiogram from [facility] on 12/20/2012 reported mild LVH with LVEF 09-73%, diastolic dysfunction, mild left atrial enlargement, upper normal RV chamber size, MAC with mild mitral regurgitation, sclerotic aortic valve with trace aortic regurgitation, mild tricuspid regurgitation with RVSP 25-35 mmHg.  Assessment and Plan:  1. Symptomatically stable CAD on medical therapy. Continue activity as tolerated, no changes in current medications.   2. History of paroxysmal atrial fibrillation, no active palpitations. He continues on Xarelto for stroke prophylaxis. Maintain regular follow-up with Dr. Woody Seller for lab work.  Current medicines were reviewed with the patient today.  Disposition: FU with me in 6 months.   Signed, Satira Sark, MD, [facility] 09/06/2014 2:28 PM    [place] at [place], [place], [place] [address] Phone: [phone]; Fax: [phone]

## 2014-09-06 NOTE — Patient Instructions (Signed)
Your physician recommends that you continue on your current medications as directed. Please refer to the Current Medication list given to you today. Your physician recommends that you schedule a follow-up appointment in: 6 months. You will receive a reminder letter in the mail in about 4 months reminding you to call and schedule your appointment. If you don't receive this letter, please contact our office. 

## 2015-01-22 DIAGNOSIS — M170 Bilateral primary osteoarthritis of knee: Secondary | ICD-10-CM | POA: Diagnosis not present

## 2015-01-30 ENCOUNTER — Encounter: Payer: Self-pay | Admitting: Internal Medicine

## 2015-02-05 DIAGNOSIS — H35032 Hypertensive retinopathy, left eye: Secondary | ICD-10-CM | POA: Diagnosis not present

## 2015-02-05 DIAGNOSIS — H2511 Age-related nuclear cataract, right eye: Secondary | ICD-10-CM | POA: Diagnosis not present

## 2015-02-05 DIAGNOSIS — H25011 Cortical age-related cataract, right eye: Secondary | ICD-10-CM | POA: Diagnosis not present

## 2015-02-05 DIAGNOSIS — H353112 Nonexudative age-related macular degeneration, right eye, intermediate dry stage: Secondary | ICD-10-CM | POA: Diagnosis not present

## 2015-02-05 DIAGNOSIS — H35031 Hypertensive retinopathy, right eye: Secondary | ICD-10-CM | POA: Diagnosis not present

## 2015-02-05 DIAGNOSIS — H353122 Nonexudative age-related macular degeneration, left eye, intermediate dry stage: Secondary | ICD-10-CM | POA: Diagnosis not present

## 2015-02-05 DIAGNOSIS — H25012 Cortical age-related cataract, left eye: Secondary | ICD-10-CM | POA: Diagnosis not present

## 2015-02-12 ENCOUNTER — Other Ambulatory Visit: Payer: Self-pay | Admitting: *Deleted

## 2015-02-12 MED ORDER — ATORVASTATIN CALCIUM 20 MG PO TABS: 20.0000 mg | ORAL_TABLET | Freq: Every day | ORAL | Status: DC

## 2015-02-13 DIAGNOSIS — H2511 Age-related nuclear cataract, right eye: Secondary | ICD-10-CM | POA: Diagnosis not present

## 2015-02-16 DIAGNOSIS — M170 Bilateral primary osteoarthritis of knee: Secondary | ICD-10-CM | POA: Diagnosis not present

## 2015-02-22 DIAGNOSIS — F17200 Nicotine dependence, unspecified, uncomplicated: Secondary | ICD-10-CM | POA: Diagnosis not present

## 2015-02-22 DIAGNOSIS — Z85828 Personal history of other malignant neoplasm of skin: Secondary | ICD-10-CM | POA: Diagnosis not present

## 2015-02-22 DIAGNOSIS — W458XXA Other foreign body or object entering through skin, initial encounter: Secondary | ICD-10-CM | POA: Diagnosis not present

## 2015-02-22 DIAGNOSIS — S61210A Laceration without foreign body of right index finger without damage to nail, initial encounter: Secondary | ICD-10-CM | POA: Diagnosis not present

## 2015-02-22 DIAGNOSIS — K219 Gastro-esophageal reflux disease without esophagitis: Secondary | ICD-10-CM | POA: Diagnosis not present

## 2015-02-22 DIAGNOSIS — Z7901 Long term (current) use of anticoagulants: Secondary | ICD-10-CM | POA: Diagnosis not present

## 2015-02-22 DIAGNOSIS — Z79899 Other long term (current) drug therapy: Secondary | ICD-10-CM | POA: Diagnosis not present

## 2015-02-22 DIAGNOSIS — I4891 Unspecified atrial fibrillation: Secondary | ICD-10-CM | POA: Diagnosis not present

## 2015-02-22 DIAGNOSIS — E119 Type 2 diabetes mellitus without complications: Secondary | ICD-10-CM | POA: Diagnosis not present

## 2015-02-22 DIAGNOSIS — S6991XA Unspecified injury of right wrist, hand and finger(s), initial encounter: Secondary | ICD-10-CM | POA: Diagnosis not present

## 2015-02-22 DIAGNOSIS — I10 Essential (primary) hypertension: Secondary | ICD-10-CM | POA: Diagnosis not present

## 2015-02-22 DIAGNOSIS — W228XXA Striking against or struck by other objects, initial encounter: Secondary | ICD-10-CM | POA: Diagnosis not present

## 2015-03-08 DIAGNOSIS — H2512 Age-related nuclear cataract, left eye: Secondary | ICD-10-CM | POA: Diagnosis not present

## 2015-03-08 DIAGNOSIS — H25012 Cortical age-related cataract, left eye: Secondary | ICD-10-CM | POA: Diagnosis not present

## 2015-03-15 DIAGNOSIS — M25562 Pain in left knee: Secondary | ICD-10-CM | POA: Diagnosis not present

## 2015-03-15 DIAGNOSIS — M5417 Radiculopathy, lumbosacral region: Secondary | ICD-10-CM | POA: Diagnosis not present

## 2015-03-20 DIAGNOSIS — H2512 Age-related nuclear cataract, left eye: Secondary | ICD-10-CM | POA: Diagnosis not present

## 2015-03-22 DIAGNOSIS — I10 Essential (primary) hypertension: Secondary | ICD-10-CM | POA: Diagnosis not present

## 2015-03-22 DIAGNOSIS — E119 Type 2 diabetes mellitus without complications: Secondary | ICD-10-CM | POA: Diagnosis not present

## 2015-04-09 DIAGNOSIS — Z7984 Long term (current) use of oral hypoglycemic drugs: Secondary | ICD-10-CM | POA: Diagnosis not present

## 2015-04-09 DIAGNOSIS — H6123 Impacted cerumen, bilateral: Secondary | ICD-10-CM | POA: Diagnosis not present

## 2015-04-09 DIAGNOSIS — Z7901 Long term (current) use of anticoagulants: Secondary | ICD-10-CM | POA: Diagnosis not present

## 2015-04-09 DIAGNOSIS — I480 Paroxysmal atrial fibrillation: Secondary | ICD-10-CM | POA: Diagnosis not present

## 2015-04-09 DIAGNOSIS — Z885 Allergy status to narcotic agent status: Secondary | ICD-10-CM | POA: Diagnosis not present

## 2015-04-09 DIAGNOSIS — Z09 Encounter for follow-up examination after completed treatment for conditions other than malignant neoplasm: Secondary | ICD-10-CM | POA: Diagnosis not present

## 2015-04-09 DIAGNOSIS — I10 Essential (primary) hypertension: Secondary | ICD-10-CM | POA: Diagnosis not present

## 2015-04-09 DIAGNOSIS — I2510 Atherosclerotic heart disease of native coronary artery without angina pectoris: Secondary | ICD-10-CM | POA: Diagnosis not present

## 2015-04-09 DIAGNOSIS — E039 Hypothyroidism, unspecified: Secondary | ICD-10-CM | POA: Diagnosis not present

## 2015-04-09 DIAGNOSIS — Z79899 Other long term (current) drug therapy: Secondary | ICD-10-CM | POA: Diagnosis not present

## 2015-04-09 DIAGNOSIS — Z886 Allergy status to analgesic agent status: Secondary | ICD-10-CM | POA: Diagnosis not present

## 2015-04-09 DIAGNOSIS — E119 Type 2 diabetes mellitus without complications: Secondary | ICD-10-CM | POA: Diagnosis not present

## 2015-04-09 DIAGNOSIS — H903 Sensorineural hearing loss, bilateral: Secondary | ICD-10-CM | POA: Diagnosis not present

## 2015-04-11 DIAGNOSIS — Z299 Encounter for prophylactic measures, unspecified: Secondary | ICD-10-CM | POA: Diagnosis not present

## 2015-04-11 DIAGNOSIS — E1165 Type 2 diabetes mellitus with hyperglycemia: Secondary | ICD-10-CM | POA: Diagnosis not present

## 2015-04-11 DIAGNOSIS — M545 Low back pain: Secondary | ICD-10-CM | POA: Diagnosis not present

## 2015-04-11 DIAGNOSIS — Z87891 Personal history of nicotine dependence: Secondary | ICD-10-CM | POA: Diagnosis not present

## 2015-04-12 DIAGNOSIS — M170 Bilateral primary osteoarthritis of knee: Secondary | ICD-10-CM | POA: Diagnosis not present

## 2015-04-12 DIAGNOSIS — M25562 Pain in left knee: Secondary | ICD-10-CM | POA: Diagnosis not present

## 2015-04-23 DIAGNOSIS — E119 Type 2 diabetes mellitus without complications: Secondary | ICD-10-CM | POA: Diagnosis not present

## 2015-04-23 DIAGNOSIS — I10 Essential (primary) hypertension: Secondary | ICD-10-CM | POA: Diagnosis not present

## 2015-04-23 DIAGNOSIS — D649 Anemia, unspecified: Secondary | ICD-10-CM | POA: Diagnosis not present

## 2015-04-23 DIAGNOSIS — E890 Postprocedural hypothyroidism: Secondary | ICD-10-CM | POA: Diagnosis not present

## 2015-04-24 DIAGNOSIS — Z9889 Other specified postprocedural states: Secondary | ICD-10-CM | POA: Diagnosis not present

## 2015-04-24 DIAGNOSIS — M545 Low back pain: Secondary | ICD-10-CM | POA: Diagnosis not present

## 2015-04-24 DIAGNOSIS — M5137 Other intervertebral disc degeneration, lumbosacral region: Secondary | ICD-10-CM | POA: Diagnosis not present

## 2015-04-24 DIAGNOSIS — M5136 Other intervertebral disc degeneration, lumbar region: Secondary | ICD-10-CM | POA: Diagnosis not present

## 2015-04-24 DIAGNOSIS — M5126 Other intervertebral disc displacement, lumbar region: Secondary | ICD-10-CM | POA: Diagnosis not present

## 2015-04-24 DIAGNOSIS — M4806 Spinal stenosis, lumbar region: Secondary | ICD-10-CM | POA: Diagnosis not present

## 2015-04-27 DIAGNOSIS — E1165 Type 2 diabetes mellitus with hyperglycemia: Secondary | ICD-10-CM | POA: Diagnosis not present

## 2015-04-27 DIAGNOSIS — M545 Low back pain: Secondary | ICD-10-CM | POA: Diagnosis not present

## 2015-04-27 DIAGNOSIS — I10 Essential (primary) hypertension: Secondary | ICD-10-CM | POA: Diagnosis not present

## 2015-04-27 DIAGNOSIS — M5417 Radiculopathy, lumbosacral region: Secondary | ICD-10-CM | POA: Diagnosis not present

## 2015-05-10 DIAGNOSIS — I10 Essential (primary) hypertension: Secondary | ICD-10-CM | POA: Diagnosis not present

## 2015-05-10 DIAGNOSIS — E119 Type 2 diabetes mellitus without complications: Secondary | ICD-10-CM | POA: Diagnosis not present

## 2015-05-18 DIAGNOSIS — M5136 Other intervertebral disc degeneration, lumbar region: Secondary | ICD-10-CM | POA: Diagnosis not present

## 2015-05-18 DIAGNOSIS — M79604 Pain in right leg: Secondary | ICD-10-CM | POA: Diagnosis not present

## 2015-05-18 DIAGNOSIS — M549 Dorsalgia, unspecified: Secondary | ICD-10-CM | POA: Diagnosis not present

## 2015-05-18 DIAGNOSIS — M79605 Pain in left leg: Secondary | ICD-10-CM | POA: Diagnosis not present

## 2015-05-18 DIAGNOSIS — M47816 Spondylosis without myelopathy or radiculopathy, lumbar region: Secondary | ICD-10-CM | POA: Diagnosis not present

## 2015-05-18 DIAGNOSIS — M545 Low back pain: Secondary | ICD-10-CM | POA: Diagnosis not present

## 2015-05-18 DIAGNOSIS — M546 Pain in thoracic spine: Secondary | ICD-10-CM | POA: Diagnosis not present

## 2015-05-21 ENCOUNTER — Other Ambulatory Visit: Payer: Self-pay | Admitting: *Deleted

## 2015-05-21 MED ORDER — ATORVASTATIN CALCIUM 20 MG PO TABS: 20.0000 mg | ORAL_TABLET | Freq: Every day | ORAL | Status: DC

## 2015-06-05 ENCOUNTER — Other Ambulatory Visit: Payer: Self-pay | Admitting: Cardiology

## 2015-06-12 DIAGNOSIS — E119 Type 2 diabetes mellitus without complications: Secondary | ICD-10-CM | POA: Diagnosis not present

## 2015-06-12 DIAGNOSIS — I10 Essential (primary) hypertension: Secondary | ICD-10-CM | POA: Diagnosis not present

## 2015-06-15 DIAGNOSIS — M545 Low back pain: Secondary | ICD-10-CM | POA: Diagnosis not present

## 2015-06-15 DIAGNOSIS — J069 Acute upper respiratory infection, unspecified: Secondary | ICD-10-CM | POA: Diagnosis not present

## 2015-07-06 DIAGNOSIS — L723 Sebaceous cyst: Secondary | ICD-10-CM | POA: Diagnosis not present

## 2015-07-19 DIAGNOSIS — Z299 Encounter for prophylactic measures, unspecified: Secondary | ICD-10-CM | POA: Diagnosis not present

## 2015-07-19 DIAGNOSIS — R0781 Pleurodynia: Secondary | ICD-10-CM | POA: Diagnosis not present

## 2015-07-19 DIAGNOSIS — R0600 Dyspnea, unspecified: Secondary | ICD-10-CM | POA: Diagnosis not present

## 2015-07-20 DIAGNOSIS — I10 Essential (primary) hypertension: Secondary | ICD-10-CM | POA: Diagnosis not present

## 2015-07-20 DIAGNOSIS — E119 Type 2 diabetes mellitus without complications: Secondary | ICD-10-CM | POA: Diagnosis not present

## 2015-07-23 DIAGNOSIS — L720 Epidermal cyst: Secondary | ICD-10-CM | POA: Diagnosis not present

## 2015-07-23 DIAGNOSIS — D485 Neoplasm of uncertain behavior of skin: Secondary | ICD-10-CM | POA: Diagnosis not present

## 2015-07-26 DIAGNOSIS — E1165 Type 2 diabetes mellitus with hyperglycemia: Secondary | ICD-10-CM | POA: Diagnosis not present

## 2015-07-26 DIAGNOSIS — I10 Essential (primary) hypertension: Secondary | ICD-10-CM | POA: Diagnosis not present

## 2015-07-26 DIAGNOSIS — I4891 Unspecified atrial fibrillation: Secondary | ICD-10-CM | POA: Diagnosis not present

## 2015-07-26 DIAGNOSIS — Z6830 Body mass index (BMI) 30.0-30.9, adult: Secondary | ICD-10-CM | POA: Diagnosis not present

## 2015-07-27 DIAGNOSIS — I10 Essential (primary) hypertension: Secondary | ICD-10-CM | POA: Diagnosis not present

## 2015-07-30 DIAGNOSIS — I10 Essential (primary) hypertension: Secondary | ICD-10-CM | POA: Diagnosis not present

## 2015-08-02 DIAGNOSIS — H6123 Impacted cerumen, bilateral: Secondary | ICD-10-CM | POA: Diagnosis not present

## 2015-08-02 DIAGNOSIS — H61393 Other acquired stenosis of external ear canal, bilateral: Secondary | ICD-10-CM | POA: Diagnosis not present

## 2015-08-02 DIAGNOSIS — H903 Sensorineural hearing loss, bilateral: Secondary | ICD-10-CM | POA: Diagnosis not present

## 2015-08-02 DIAGNOSIS — H61303 Acquired stenosis of external ear canal, unspecified, bilateral: Secondary | ICD-10-CM | POA: Diagnosis not present

## 2015-08-07 DIAGNOSIS — I10 Essential (primary) hypertension: Secondary | ICD-10-CM | POA: Diagnosis not present

## 2015-08-27 DIAGNOSIS — E119 Type 2 diabetes mellitus without complications: Secondary | ICD-10-CM | POA: Diagnosis not present

## 2015-08-27 DIAGNOSIS — I10 Essential (primary) hypertension: Secondary | ICD-10-CM | POA: Diagnosis not present

## 2015-09-26 DIAGNOSIS — G8929 Other chronic pain: Secondary | ICD-10-CM | POA: Diagnosis not present

## 2015-09-26 DIAGNOSIS — M25561 Pain in right knee: Secondary | ICD-10-CM | POA: Diagnosis not present

## 2015-09-26 DIAGNOSIS — M170 Bilateral primary osteoarthritis of knee: Secondary | ICD-10-CM | POA: Diagnosis not present

## 2015-10-15 NOTE — Progress Notes (Deleted)
Cardiology Office Note  Date: 10/15/2015   ID: Brett Matthews, DOB October 26, 1932, MRN [id number]  PCP: Glenda Chroman, MD  Primary Cardiologist: Rozann Lesches, MD   No chief complaint on file.   History of Present Illness: Brett Matthews is an 82 y.o. male last seen in August 2016.  Past Medical History:  Diagnosis Date  . Anemia, iron deficiency    Negative capsule endoscopy  . Arthritis   . Coronary atherosclerosis of native coronary artery    60 % circumflex stenosis; EF 65%, Cath 6/13  . Diverticula of colon    Pancolonic  . DM (diabetes mellitus), type 2, uncontrolled   . Dyspnea    Normal cardiopulmonary function test in July 2008, negative echocardiogram with "bubble" study for inter-cardiac shunt.  . Essential hypertension, benign   . Gastroesophageal reflux disease   . Hemorrhoids   . Hyperplastic colon polyp 04/06/2007  . Hypothyroidism 2003   Status post total thyroidectomy  . Migraines   . Neoplasm of lymphatic and hematopoietic tissue   . Paroxysmal atrial fibrillation    Diagnosed 2005  . Skin cancer     Past Surgical History:  Procedure Laterality Date  . BACK SURGERY    . BONE MARROW BIOPSY  1990's  . COLONOSCOPY  04/06/2007   Dr. Gala Romney- Normal rectum with scattered pancolonic diverticula and slightly redundant elongated colon, diminutive polpy midsigmoid, remainder of colonic mucosa appeared normal. bx= hyperplastic polyp  . COLONOSCOPY N/A 11/29/2012   [id number] preparation. Friable anal canal/internal hemorrhoids; otherwise, normal rectum. Normal-appearing colonic mucosa  . ESOPHAGOGASTRODUODENOSCOPY  04/06/2007   Dr. Gala Romney- normal esophagus s/p nissen fundoplication, intact nissen wrap o/w normal stomach  . ESOPHAGOGASTRODUODENOSCOPY N/A 11/29/2012   RMR: Abnormall distal esophagus bx c/w GERD. prior fundoplication. Gastric polyps  -bx benign. Status post biopsy of normal--appearing duodenal mucosa (bx neg)  . LEFT HEART CATHETERIZATION WITH  CORONARY ANGIOGRAM N/A 06/27/2011   Procedure: LEFT HEART CATHETERIZATION WITH CORONARY ANGIOGRAM;  Surgeon: Thayer Headings, MD;  Location: [facility] CATH LAB;  Service: Cardiovascular;  Laterality: N/A;  . NISSEN FUNDOPLICATION    . POSTERIOR LAMINECTOMY / DECOMPRESSION LUMBAR SPINE  ~ 1990's  . SKIN CANCER EXCISION     "off my back and chest; from sun"    Current Outpatient Prescriptions  Medication Sig Dispense Refill  . amLODipine (NORVASC) 10 MG tablet Take 10 mg by mouth daily.      Marland Kitchen atorvastatin (LIPITOR) 20 MG tablet take 1 tablet by mouth once daily 15 tablet 0  . Azelastine HCl 0.15 % SOLN Place 1 spray into the nose as needed.    . benazepril (LOTENSIN) 5 MG tablet Take 5 mg by mouth daily.    . clonazePAM (KLONOPIN) 0.5 MG tablet Take 0.25 mg by mouth as needed.    . diclofenac (VOLTAREN) 75 MG EC tablet Take 1 tablet by mouth daily.     Marland Kitchen dutasteride (AVODART) 0.5 MG capsule Take 0.5 mg by mouth daily.    Marland Kitchen DYMISTA 137-50 MCG/ACT SUSP Place 1 spray into the nose daily.     . finasteride (PROSCAR) 5 MG tablet Take 5 mg by mouth daily.      . folic acid (FOLVITE) 1 MG tablet Take 1 mg by mouth daily.      Marland Kitchen glucosamine-chondroitin 500-400 MG tablet Take 1 tablet by mouth 2 (two) times daily.      Marland Kitchen levothyroxine (SYNTHROID, LEVOTHROID) 125 MCG tablet Take 125 mcg by mouth daily before  breakfast.    . lubiprostone (AMITIZA) 24 MCG capsule Take 24 mcg by mouth daily.    . Magnesium 250 MG TABS Take 1 tablet by mouth daily.    . metFORMIN (GLUCOPHAGE) 500 MG tablet Take 1 tablet (500 mg total) by mouth 2 (two) times daily with a meal. HOLD 48 hours, restart on 06/30/2011.    . metoprolol tartrate (LOPRESSOR) 25 MG tablet take 1 tablet by mouth twice a day 180 tablet 3  . pantoprazole (PROTONIX) 40 MG tablet Take 40 mg by mouth daily.      . potassium chloride (MICRO-K) 10 MEQ CR capsule Take 10 mEq by mouth daily.    . rivaroxaban (XARELTO) 20 MG TABS tablet Take 1 tablet (20 mg total)  by mouth daily with supper. 20 tablet 0  . tamsulosin (FLOMAX) 0.4 MG CAPS capsule Take 0.4 mg by mouth daily after supper.      No current facility-administered medications for this visit.    Allergies:  Review of patient's allergies indicates no known allergies.   Social History: The patient  reports that he has never smoked. He has never used smokeless tobacco. He reports that he does not drink alcohol or use drugs.   Family History: The patient's family history is not on file.   ROS:  Please see the history of present illness. Otherwise, complete review of systems is positive for {NONE DEFAULTED:18576::"none"}.  All other systems are reviewed and negative.   Physical Exam: VS:  There were no vitals taken for this visit., BMI There is no height or weight on file to calculate BMI.  Wt Readings from Last 3 Encounters:  09/06/14 200 lb (90.7 kg)  09/05/14 202 lb (91.6 kg)  03/10/14 188 lb (85.3 kg)    No acute distress.  HEENT: Conjunctiva and lids normal, oropharynx clear.  Neck: Supple, no elevated JVP or carotid bruits, no thyromegaly.  Lungs: Clear to auscultation, nonlabored breathing at rest.  Cardiac: Regular rate and rhythm with frequent ectopy, no S3 or significant systolic murmur, no pericardial rub.  Abdomen: Soft, nontender, bowel sounds present.  Extremities: No pitting edema, distal pulses 2+.   ECG: I personally reviewed the tracing from 03/10/2014 which showed sinus rhythm with IVCD, anteroseptal Q waves, nonspecific ST-T changes.  Recent Labwork:  June 2015: TSH 0.79, hemoglobin 12.6, platelets 269, cholesterol 87, triglycerides 81, HDL 45, LDL 26, BUN 15, creatinine 0.8, potassium 4.2, AST 19, ALT 21, hemoglobin A1c 6.7  Other Studies Reviewed Today:  Echocardiogram [facility]) 12/20/2012: Mild LVH with LVEF 56-43%, diastolic dysfunction, mild left atrial enlargement, upper normal RV chamber size, MAC with mild mitral regurgitation, sclerotic  aortic valve with trace aortic regurgitation, mild tricuspid regurgitation with RVSP 25-35 mmHg.  Assessment and Plan:    Current medicines were reviewed with the patient today.  No orders of the defined types were placed in this encounter.   Disposition:  Signed, Satira Sark, MD, [facility] 10/15/2015 2:54 PM    [facility] at [place], [place], [place] [address] Phone: [phone]; Fax: [phone]

## 2015-10-16 ENCOUNTER — Ambulatory Visit: Payer: Medicare Other | Admitting: Cardiology

## 2015-10-17 ENCOUNTER — Encounter: Payer: Self-pay | Admitting: Cardiology

## 2015-10-22 DIAGNOSIS — E119 Type 2 diabetes mellitus without complications: Secondary | ICD-10-CM | POA: Diagnosis not present

## 2015-10-22 DIAGNOSIS — I10 Essential (primary) hypertension: Secondary | ICD-10-CM | POA: Diagnosis not present

## 2015-10-22 DIAGNOSIS — D649 Anemia, unspecified: Secondary | ICD-10-CM | POA: Diagnosis not present

## 2015-10-22 DIAGNOSIS — E890 Postprocedural hypothyroidism: Secondary | ICD-10-CM | POA: Diagnosis not present

## 2015-10-23 DIAGNOSIS — E119 Type 2 diabetes mellitus without complications: Secondary | ICD-10-CM | POA: Diagnosis not present

## 2015-10-23 DIAGNOSIS — I10 Essential (primary) hypertension: Secondary | ICD-10-CM | POA: Diagnosis not present

## 2015-10-25 DIAGNOSIS — H40013 Open angle with borderline findings, low risk, bilateral: Secondary | ICD-10-CM | POA: Diagnosis not present

## 2015-10-25 DIAGNOSIS — H04569 Stenosis of unspecified lacrimal punctum: Secondary | ICD-10-CM | POA: Diagnosis not present

## 2015-10-25 DIAGNOSIS — H47232 Glaucomatous optic atrophy, left eye: Secondary | ICD-10-CM | POA: Diagnosis not present

## 2015-10-25 DIAGNOSIS — H02103 Unspecified ectropion of right eye, unspecified eyelid: Secondary | ICD-10-CM | POA: Diagnosis not present

## 2015-10-30 DIAGNOSIS — Z0000 Encounter for general adult medical examination without abnormal findings: Secondary | ICD-10-CM | POA: Diagnosis not present

## 2015-10-30 DIAGNOSIS — Z299 Encounter for prophylactic measures, unspecified: Secondary | ICD-10-CM | POA: Diagnosis not present

## 2015-10-30 DIAGNOSIS — E7800 Pure hypercholesterolemia, unspecified: Secondary | ICD-10-CM | POA: Diagnosis not present

## 2015-10-30 DIAGNOSIS — Z7189 Other specified counseling: Secondary | ICD-10-CM | POA: Diagnosis not present

## 2015-10-30 DIAGNOSIS — E1165 Type 2 diabetes mellitus with hyperglycemia: Secondary | ICD-10-CM | POA: Diagnosis not present

## 2015-10-30 DIAGNOSIS — Z1389 Encounter for screening for other disorder: Secondary | ICD-10-CM | POA: Diagnosis not present

## 2015-10-30 DIAGNOSIS — Z79899 Other long term (current) drug therapy: Secondary | ICD-10-CM | POA: Diagnosis not present

## 2015-10-30 DIAGNOSIS — Z1211 Encounter for screening for malignant neoplasm of colon: Secondary | ICD-10-CM | POA: Diagnosis not present

## 2015-11-22 DIAGNOSIS — Z299 Encounter for prophylactic measures, unspecified: Secondary | ICD-10-CM | POA: Diagnosis not present

## 2015-11-22 DIAGNOSIS — I4891 Unspecified atrial fibrillation: Secondary | ICD-10-CM | POA: Diagnosis not present

## 2015-11-22 DIAGNOSIS — J069 Acute upper respiratory infection, unspecified: Secondary | ICD-10-CM | POA: Diagnosis not present

## 2015-11-22 DIAGNOSIS — E1165 Type 2 diabetes mellitus with hyperglycemia: Secondary | ICD-10-CM | POA: Diagnosis not present

## 2015-11-22 DIAGNOSIS — N400 Enlarged prostate without lower urinary tract symptoms: Secondary | ICD-10-CM | POA: Diagnosis not present

## 2015-11-26 DIAGNOSIS — I10 Essential (primary) hypertension: Secondary | ICD-10-CM | POA: Diagnosis not present

## 2015-11-26 DIAGNOSIS — I480 Paroxysmal atrial fibrillation: Secondary | ICD-10-CM | POA: Diagnosis not present

## 2015-11-26 DIAGNOSIS — E119 Type 2 diabetes mellitus without complications: Secondary | ICD-10-CM | POA: Diagnosis not present

## 2015-11-26 DIAGNOSIS — Z79899 Other long term (current) drug therapy: Secondary | ICD-10-CM | POA: Diagnosis not present

## 2015-11-26 DIAGNOSIS — Z7901 Long term (current) use of anticoagulants: Secondary | ICD-10-CM | POA: Diagnosis not present

## 2015-11-26 DIAGNOSIS — H6123 Impacted cerumen, bilateral: Secondary | ICD-10-CM | POA: Diagnosis not present

## 2015-11-26 DIAGNOSIS — I2510 Atherosclerotic heart disease of native coronary artery without angina pectoris: Secondary | ICD-10-CM | POA: Diagnosis not present

## 2015-11-26 DIAGNOSIS — H903 Sensorineural hearing loss, bilateral: Secondary | ICD-10-CM | POA: Diagnosis not present

## 2015-11-26 DIAGNOSIS — E039 Hypothyroidism, unspecified: Secondary | ICD-10-CM | POA: Diagnosis not present

## 2015-11-26 DIAGNOSIS — Z09 Encounter for follow-up examination after completed treatment for conditions other than malignant neoplasm: Secondary | ICD-10-CM | POA: Diagnosis not present

## 2015-11-26 DIAGNOSIS — Z885 Allergy status to narcotic agent status: Secondary | ICD-10-CM | POA: Diagnosis not present

## 2015-11-26 DIAGNOSIS — Z886 Allergy status to analgesic agent status: Secondary | ICD-10-CM | POA: Diagnosis not present

## 2015-11-26 DIAGNOSIS — Z7984 Long term (current) use of oral hypoglycemic drugs: Secondary | ICD-10-CM | POA: Diagnosis not present

## 2015-12-04 DIAGNOSIS — H53483 Generalized contraction of visual field, bilateral: Secondary | ICD-10-CM | POA: Diagnosis not present

## 2015-12-04 DIAGNOSIS — H02131 Senile ectropion of right upper eyelid: Secondary | ICD-10-CM | POA: Diagnosis not present

## 2015-12-04 DIAGNOSIS — H02135 Senile ectropion of left lower eyelid: Secondary | ICD-10-CM | POA: Diagnosis not present

## 2015-12-04 DIAGNOSIS — H11433 Conjunctival hyperemia, bilateral: Secondary | ICD-10-CM | POA: Diagnosis not present

## 2015-12-06 DIAGNOSIS — Z713 Dietary counseling and surveillance: Secondary | ICD-10-CM | POA: Diagnosis not present

## 2015-12-06 DIAGNOSIS — Z6829 Body mass index (BMI) 29.0-29.9, adult: Secondary | ICD-10-CM | POA: Diagnosis not present

## 2015-12-06 DIAGNOSIS — J069 Acute upper respiratory infection, unspecified: Secondary | ICD-10-CM | POA: Diagnosis not present

## 2015-12-17 DIAGNOSIS — Z299 Encounter for prophylactic measures, unspecified: Secondary | ICD-10-CM | POA: Diagnosis not present

## 2015-12-17 DIAGNOSIS — M25512 Pain in left shoulder: Secondary | ICD-10-CM | POA: Diagnosis not present

## 2015-12-26 ENCOUNTER — Ambulatory Visit: Payer: Medicare Other | Admitting: Cardiology

## 2016-01-17 DIAGNOSIS — M25512 Pain in left shoulder: Secondary | ICD-10-CM | POA: Diagnosis not present

## 2016-01-17 DIAGNOSIS — I4891 Unspecified atrial fibrillation: Secondary | ICD-10-CM | POA: Diagnosis not present

## 2016-01-17 DIAGNOSIS — E1165 Type 2 diabetes mellitus with hyperglycemia: Secondary | ICD-10-CM | POA: Diagnosis not present

## 2016-01-17 DIAGNOSIS — Z299 Encounter for prophylactic measures, unspecified: Secondary | ICD-10-CM | POA: Diagnosis not present

## 2016-01-17 DIAGNOSIS — Z6830 Body mass index (BMI) 30.0-30.9, adult: Secondary | ICD-10-CM | POA: Diagnosis not present

## 2016-01-24 ENCOUNTER — Encounter: Payer: Self-pay | Admitting: *Deleted

## 2016-01-25 ENCOUNTER — Encounter: Payer: Self-pay | Admitting: Cardiology

## 2016-01-25 ENCOUNTER — Ambulatory Visit (INDEPENDENT_AMBULATORY_CARE_PROVIDER_SITE_OTHER): Payer: PPO | Admitting: Cardiology

## 2016-01-25 VITALS — BP 156/80 | HR 67 | Ht 70.0 in | Wt 205.0 lb

## 2016-01-25 DIAGNOSIS — I10 Essential (primary) hypertension: Secondary | ICD-10-CM | POA: Diagnosis not present

## 2016-01-25 DIAGNOSIS — I2510 Atherosclerotic heart disease of native coronary artery without angina pectoris: Secondary | ICD-10-CM

## 2016-01-25 DIAGNOSIS — E782 Mixed hyperlipidemia: Secondary | ICD-10-CM

## 2016-01-25 DIAGNOSIS — I480 Paroxysmal atrial fibrillation: Secondary | ICD-10-CM | POA: Diagnosis not present

## 2016-01-25 NOTE — Progress Notes (Signed)
Cardiology Office Note  Date: 01/25/2016   ID: Brett Matthews, DOB [birth year], MRN [id number]  PCP: Glenda Chroman, MD  Primary Cardiologist: Rozann Lesches, MD   Chief Complaint  Patient presents with  . Coronary Artery Disease  . PAF    History of Present Illness: Brett Matthews is an 83 y.o. male last seen in August 2016. He presents overdue for a follow-up visit. Actually, continues to do quite well without angina symptoms. 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 Does have arthritic pains which limited him, particular in the knees.  I reviewed his ECG today which shows sinus rhythm with PACs, decreased R wave progression.  Current cardiac regimen includes Norvasc, Lipitor, Lopressor, and Xarelto. He does not report any bleeding problems.  No significant palpitations or syncope.  He continues to follow with Dr. Woody Seller for routine lab work.  Past Medical History:  Diagnosis Date  . Anemia, iron deficiency    Negative capsule endoscopy  . Arthritis   . Coronary atherosclerosis of native coronary artery    60 % circumflex stenosis; EF 65%, Cath 6/13  . Diverticula of colon    Pancolonic  . DM (diabetes mellitus), type 2, uncontrolled ([place])   . Dyspnea    Normal cardiopulmonary function test in July 2008, negative echocardiogram with "bubble" study for inter-cardiac shunt.  . Essential hypertension, benign   . Gastroesophageal reflux disease   . Hemorrhoids   . Hyperplastic colon polyp 04/06/2007  . Hypothyroidism 2003   Status post total thyroidectomy  . Migraines   . Neoplasm of lymphatic and hematopoietic tissue   . Paroxysmal atrial fibrillation ([place])    Diagnosed 2005  . Skin cancer     Past Surgical History:  Procedure Laterality Date  . BACK SURGERY    . BONE MARROW BIOPSY  1990's  . COLONOSCOPY  04/06/2007   Dr. Gala Romney- Normal rectum with  scattered pancolonic diverticula and slightly redundant elongated colon, diminutive polpy midsigmoid, remainder of colonic mucosa appeared normal. bx= hyperplastic polyp  . COLONOSCOPY N/A 11/29/2012   [id number] preparation. Friable anal canal/internal hemorrhoids; otherwise, normal rectum. Normal-appearing colonic mucosa  . ESOPHAGOGASTRODUODENOSCOPY  04/06/2007   Dr. Gala Romney- normal esophagus s/p nissen fundoplication, intact nissen wrap o/w normal stomach  . ESOPHAGOGASTRODUODENOSCOPY N/A 11/29/2012   RMR: Abnormall distal esophagus bx c/w GERD. prior fundoplication. Gastric polyps  -bx benign. Status post biopsy of normal--appearing duodenal mucosa (bx neg)  . LEFT HEART CATHETERIZATION WITH CORONARY ANGIOGRAM N/A 06/27/2011   Procedure: LEFT HEART CATHETERIZATION WITH CORONARY ANGIOGRAM;  Surgeon: Thayer Headings, MD;  Location: [facility] CATH LAB;  Service: Cardiovascular;  Laterality: N/A;  . NISSEN FUNDOPLICATION    . POSTERIOR LAMINECTOMY / DECOMPRESSION LUMBAR SPINE  ~ 1990's  . SKIN CANCER EXCISION     "off my back and chest; from sun"    Current Outpatient Prescriptions  Medication Sig Dispense Refill  . amLODipine (NORVASC) 10 MG tablet Take 10 mg by mouth daily.      Marland Kitchen atorvastatin (LIPITOR) 40 MG tablet Take 40 mg by mouth daily.    . Azelastine HCl 0.15 % SOLN Place 1 spray into the nose as needed.    . benazepril (LOTENSIN) 5 MG tablet Take 5 mg by mouth daily.    . clonazePAM (KLONOPIN) 0.5 MG tablet Take 0.25 mg by mouth as needed.    Marland Kitchen  diclofenac (VOLTAREN) 75 MG EC tablet Take 1 tablet by mouth daily.     Marland Kitchen DYMISTA 137-50 MCG/ACT SUSP Place 1 spray into the nose daily.     . fexofenadine (ALLEGRA) 180 MG tablet Take 180 mg by mouth daily.    . folic acid (FOLVITE) 1 MG tablet Take 1 mg by mouth daily.      Marland Kitchen glucosamine-chondroitin 500-400 MG tablet Take 1 tablet by mouth 2 (two) times daily.      Marland Kitchen levothyroxine (SYNTHROID, LEVOTHROID) 125 MCG tablet Take 125 mcg by mouth  daily before breakfast.    . lubiprostone (AMITIZA) 24 MCG capsule Take 24 mcg by mouth daily.    . Magnesium 250 MG TABS Take 1 tablet by mouth daily.    . metFORMIN (GLUCOPHAGE) 500 MG tablet Take 1 tablet (500 mg total) by mouth 2 (two) times daily with a meal. HOLD 48 hours, restart on 06/30/2011.    . metoprolol tartrate (LOPRESSOR) 25 MG tablet take 1 tablet by mouth twice a day 180 tablet 3  . pantoprazole (PROTONIX) 40 MG tablet Take 40 mg by mouth daily.      . rivaroxaban (XARELTO) 20 MG TABS tablet Take 1 tablet (20 mg total) by mouth daily with supper. 20 tablet 0  . tamsulosin (FLOMAX) 0.4 MG CAPS capsule Take 0.4 mg by mouth daily after supper.     . potassium chloride (MICRO-K) 10 MEQ CR capsule Take 1 capsule by mouth daily.     No current facility-administered medications for this visit.    Allergies:  Patient has no known allergies.   Social History: The patient  reports that he has never smoked. He has never used smokeless tobacco. He reports that he does not drink alcohol or use drugs.   ROS:  Please see the history of present illness. Otherwise, complete review of systems is positive for hearing loss.  All other systems are reviewed and negative.   Physical Exam: VS:  BP (!) 156/80   Pulse 67   Ht _0  (1.778 m)   Wt 205 lb (93 kg)   BMI 29.41 kg/m , BMI Body mass index is 29.41 kg/m.  Wt Readings from Last 3 Encounters:  01/25/16 205 lb (93 kg)  09/06/14 200 lb (90.7 kg)  09/05/14 202 lb (91.6 kg)    Elderly male, appears comfortable at rest. HEENT: Conjunctiva and lids normal, oropharynx clear.  Neck: Supple, no elevated JVP or carotid bruits, no thyromegaly.  Lungs: Clear to auscultation, nonlabored breathing at rest.  Cardiac: Regular rate and rhythm with ectopy, no S3 or significant systolic murmur, no pericardial rub.  Abdomen: Soft, nontender, bowel sounds present.  Extremities: No pitting edema, distal pulses 2+.  Skin: Warm and  dry. Musculoskeletal: No kyphosis. Neuropsychiatric: Alert and oriented 3, affect appropriate.  ECG: I personally reviewed the tracing from 03/10/2014 which showed sinus rhythm with low voltage, IVCD, and nonspecific ST-T changes.  Recent Labwork:  June 2015: TSH 0.79, hemoglobin 12.6, platelets 269, cholesterol 87, triglycerides 81, HDL 45, LDL 26, BUN 15, creatinine 0.8, potassium 4.2, AST 19, ALT 21, hemoglobin A1c 6.7  Other Studies Reviewed Today:  Echocardiogram from [facility] on 12/20/2012 reported mild LVH with LVEF 95-63%, diastolic dysfunction, mild left atrial enlargement, upper normal RV chamber size, MAC with mild mitral regurgitation, sclerotic aortic valve with trace aortic regurgitation, mild tricuspid regurgitation with RVSP 25-35 mmHg.  Assessment and Plan:  1. Paroxysmal atrial fibrillation, no obvious major breakthrough events. PACs  noted by ECG today with sinus rhythm at baseline. He continues on Xarelto for stroke prophylaxis, followed by Dr. Woody Seller with routine lab work. Also continues on Lopressor.  2. Moderate circumflex disease based on previous workup, no active angina symptoms. Continue statin therapy and observation.  3. Hyperlipidemia, continues on Lipitor. LDL has been well-controlled over time.  4. Essential hypertension, blood pressure elevated today. He reports compliance with his medications. Keep follow-up with Dr. Woody Seller.  Current medicines were reviewed with the patient today.   Orders Placed This Encounter  Procedures  . EKG 12-Lead    Disposition: Follow-up in one year.  Signed, Satira Sark, MD, [facility] 01/25/2016 4:32 PM    [facility] at [place], [place], [place] [address] Phone: [phone]; Fax: [phone]

## 2016-01-25 NOTE — Patient Instructions (Signed)

## 2016-02-15 DIAGNOSIS — Z6830 Body mass index (BMI) 30.0-30.9, adult: Secondary | ICD-10-CM | POA: Diagnosis not present

## 2016-02-15 DIAGNOSIS — Z713 Dietary counseling and surveillance: Secondary | ICD-10-CM | POA: Diagnosis not present

## 2016-02-15 DIAGNOSIS — E1165 Type 2 diabetes mellitus with hyperglycemia: Secondary | ICD-10-CM | POA: Diagnosis not present

## 2016-02-15 DIAGNOSIS — Z299 Encounter for prophylactic measures, unspecified: Secondary | ICD-10-CM | POA: Diagnosis not present

## 2016-02-15 DIAGNOSIS — M79605 Pain in left leg: Secondary | ICD-10-CM | POA: Diagnosis not present

## 2016-02-19 DIAGNOSIS — M5410 Radiculopathy, site unspecified: Secondary | ICD-10-CM | POA: Diagnosis not present

## 2016-02-29 DIAGNOSIS — Z6829 Body mass index (BMI) 29.0-29.9, adult: Secondary | ICD-10-CM | POA: Diagnosis not present

## 2016-02-29 DIAGNOSIS — Z299 Encounter for prophylactic measures, unspecified: Secondary | ICD-10-CM | POA: Diagnosis not present

## 2016-02-29 DIAGNOSIS — B029 Zoster without complications: Secondary | ICD-10-CM | POA: Diagnosis not present

## 2016-02-29 DIAGNOSIS — N400 Enlarged prostate without lower urinary tract symptoms: Secondary | ICD-10-CM | POA: Diagnosis not present

## 2016-02-29 DIAGNOSIS — I4891 Unspecified atrial fibrillation: Secondary | ICD-10-CM | POA: Diagnosis not present

## 2016-02-29 DIAGNOSIS — E1165 Type 2 diabetes mellitus with hyperglycemia: Secondary | ICD-10-CM | POA: Diagnosis not present

## 2016-03-06 DIAGNOSIS — Z713 Dietary counseling and surveillance: Secondary | ICD-10-CM | POA: Diagnosis not present

## 2016-03-06 DIAGNOSIS — Z6830 Body mass index (BMI) 30.0-30.9, adult: Secondary | ICD-10-CM | POA: Diagnosis not present

## 2016-03-06 DIAGNOSIS — I4891 Unspecified atrial fibrillation: Secondary | ICD-10-CM | POA: Diagnosis not present

## 2016-03-06 DIAGNOSIS — F332 Major depressive disorder, recurrent severe without psychotic features: Secondary | ICD-10-CM | POA: Diagnosis not present

## 2016-03-06 DIAGNOSIS — Z299 Encounter for prophylactic measures, unspecified: Secondary | ICD-10-CM | POA: Diagnosis not present

## 2016-03-06 DIAGNOSIS — F430 Acute stress reaction: Secondary | ICD-10-CM | POA: Diagnosis not present

## 2016-03-06 DIAGNOSIS — I10 Essential (primary) hypertension: Secondary | ICD-10-CM | POA: Diagnosis not present

## 2016-03-25 DIAGNOSIS — H903 Sensorineural hearing loss, bilateral: Secondary | ICD-10-CM | POA: Diagnosis not present

## 2016-03-25 DIAGNOSIS — H905 Unspecified sensorineural hearing loss: Secondary | ICD-10-CM | POA: Diagnosis not present

## 2016-03-25 DIAGNOSIS — H6123 Impacted cerumen, bilateral: Secondary | ICD-10-CM | POA: Diagnosis not present

## 2016-04-07 DIAGNOSIS — M546 Pain in thoracic spine: Secondary | ICD-10-CM | POA: Diagnosis not present

## 2016-04-07 DIAGNOSIS — M47816 Spondylosis without myelopathy or radiculopathy, lumbar region: Secondary | ICD-10-CM | POA: Diagnosis not present

## 2016-04-07 DIAGNOSIS — M47812 Spondylosis without myelopathy or radiculopathy, cervical region: Secondary | ICD-10-CM | POA: Diagnosis not present

## 2016-04-07 DIAGNOSIS — M5442 Lumbago with sciatica, left side: Secondary | ICD-10-CM | POA: Diagnosis not present

## 2016-04-08 DIAGNOSIS — M546 Pain in thoracic spine: Secondary | ICD-10-CM | POA: Diagnosis not present

## 2016-04-08 DIAGNOSIS — M47812 Spondylosis without myelopathy or radiculopathy, cervical region: Secondary | ICD-10-CM | POA: Diagnosis not present

## 2016-04-08 DIAGNOSIS — M5442 Lumbago with sciatica, left side: Secondary | ICD-10-CM | POA: Diagnosis not present

## 2016-04-08 DIAGNOSIS — M47816 Spondylosis without myelopathy or radiculopathy, lumbar region: Secondary | ICD-10-CM | POA: Diagnosis not present

## 2016-04-10 DIAGNOSIS — M5442 Lumbago with sciatica, left side: Secondary | ICD-10-CM | POA: Diagnosis not present

## 2016-04-10 DIAGNOSIS — M47812 Spondylosis without myelopathy or radiculopathy, cervical region: Secondary | ICD-10-CM | POA: Diagnosis not present

## 2016-04-10 DIAGNOSIS — M47816 Spondylosis without myelopathy or radiculopathy, lumbar region: Secondary | ICD-10-CM | POA: Diagnosis not present

## 2016-04-10 DIAGNOSIS — M546 Pain in thoracic spine: Secondary | ICD-10-CM | POA: Diagnosis not present

## 2016-04-16 DIAGNOSIS — M25562 Pain in left knee: Secondary | ICD-10-CM | POA: Diagnosis not present

## 2016-04-16 DIAGNOSIS — M1712 Unilateral primary osteoarthritis, left knee: Secondary | ICD-10-CM | POA: Diagnosis not present

## 2016-04-21 DIAGNOSIS — E119 Type 2 diabetes mellitus without complications: Secondary | ICD-10-CM | POA: Diagnosis not present

## 2016-04-21 DIAGNOSIS — E890 Postprocedural hypothyroidism: Secondary | ICD-10-CM | POA: Diagnosis not present

## 2016-04-21 DIAGNOSIS — I10 Essential (primary) hypertension: Secondary | ICD-10-CM | POA: Diagnosis not present

## 2016-04-21 DIAGNOSIS — D649 Anemia, unspecified: Secondary | ICD-10-CM | POA: Diagnosis not present

## 2016-05-05 DIAGNOSIS — Z6829 Body mass index (BMI) 29.0-29.9, adult: Secondary | ICD-10-CM | POA: Diagnosis not present

## 2016-05-05 DIAGNOSIS — Z713 Dietary counseling and surveillance: Secondary | ICD-10-CM | POA: Diagnosis not present

## 2016-05-05 DIAGNOSIS — E1165 Type 2 diabetes mellitus with hyperglycemia: Secondary | ICD-10-CM | POA: Diagnosis not present

## 2016-05-05 DIAGNOSIS — Z299 Encounter for prophylactic measures, unspecified: Secondary | ICD-10-CM | POA: Diagnosis not present

## 2016-05-09 DIAGNOSIS — S50811A Abrasion of right forearm, initial encounter: Secondary | ICD-10-CM | POA: Diagnosis not present

## 2016-05-14 DIAGNOSIS — H01003 Unspecified blepharitis right eye, unspecified eyelid: Secondary | ICD-10-CM | POA: Diagnosis not present

## 2016-05-14 DIAGNOSIS — H02103 Unspecified ectropion of right eye, unspecified eyelid: Secondary | ICD-10-CM | POA: Diagnosis not present

## 2016-05-21 DIAGNOSIS — M5442 Lumbago with sciatica, left side: Secondary | ICD-10-CM | POA: Diagnosis not present

## 2016-05-21 DIAGNOSIS — M546 Pain in thoracic spine: Secondary | ICD-10-CM | POA: Diagnosis not present

## 2016-05-21 DIAGNOSIS — M47816 Spondylosis without myelopathy or radiculopathy, lumbar region: Secondary | ICD-10-CM | POA: Diagnosis not present

## 2016-05-21 DIAGNOSIS — M47812 Spondylosis without myelopathy or radiculopathy, cervical region: Secondary | ICD-10-CM | POA: Diagnosis not present

## 2016-06-17 DIAGNOSIS — Z6829 Body mass index (BMI) 29.0-29.9, adult: Secondary | ICD-10-CM | POA: Diagnosis not present

## 2016-06-17 DIAGNOSIS — N400 Enlarged prostate without lower urinary tract symptoms: Secondary | ICD-10-CM | POA: Diagnosis not present

## 2016-06-17 DIAGNOSIS — Z299 Encounter for prophylactic measures, unspecified: Secondary | ICD-10-CM | POA: Diagnosis not present

## 2016-06-17 DIAGNOSIS — I4891 Unspecified atrial fibrillation: Secondary | ICD-10-CM | POA: Diagnosis not present

## 2016-06-17 DIAGNOSIS — E1165 Type 2 diabetes mellitus with hyperglycemia: Secondary | ICD-10-CM | POA: Diagnosis not present

## 2016-06-23 DIAGNOSIS — Z961 Presence of intraocular lens: Secondary | ICD-10-CM | POA: Diagnosis not present

## 2016-06-23 DIAGNOSIS — E119 Type 2 diabetes mellitus without complications: Secondary | ICD-10-CM | POA: Diagnosis not present

## 2016-06-23 DIAGNOSIS — H40013 Open angle with borderline findings, low risk, bilateral: Secondary | ICD-10-CM | POA: Diagnosis not present

## 2016-06-23 DIAGNOSIS — H353132 Nonexudative age-related macular degeneration, bilateral, intermediate dry stage: Secondary | ICD-10-CM | POA: Diagnosis not present

## 2016-06-25 DIAGNOSIS — M25569 Pain in unspecified knee: Secondary | ICD-10-CM | POA: Diagnosis not present

## 2016-06-25 DIAGNOSIS — M1711 Unilateral primary osteoarthritis, right knee: Secondary | ICD-10-CM | POA: Diagnosis not present

## 2016-06-27 DIAGNOSIS — E1165 Type 2 diabetes mellitus with hyperglycemia: Secondary | ICD-10-CM | POA: Diagnosis not present

## 2016-06-27 DIAGNOSIS — I4891 Unspecified atrial fibrillation: Secondary | ICD-10-CM | POA: Diagnosis not present

## 2016-06-27 DIAGNOSIS — E7800 Pure hypercholesterolemia, unspecified: Secondary | ICD-10-CM | POA: Diagnosis not present

## 2016-06-27 DIAGNOSIS — I10 Essential (primary) hypertension: Secondary | ICD-10-CM | POA: Diagnosis not present

## 2016-06-27 DIAGNOSIS — K5900 Constipation, unspecified: Secondary | ICD-10-CM | POA: Diagnosis not present

## 2016-06-27 DIAGNOSIS — N400 Enlarged prostate without lower urinary tract symptoms: Secondary | ICD-10-CM | POA: Diagnosis not present

## 2016-06-27 DIAGNOSIS — Z299 Encounter for prophylactic measures, unspecified: Secondary | ICD-10-CM | POA: Diagnosis not present

## 2016-06-27 DIAGNOSIS — I83819 Varicose veins of unspecified lower extremities with pain: Secondary | ICD-10-CM | POA: Diagnosis not present

## 2016-06-27 DIAGNOSIS — Z6830 Body mass index (BMI) 30.0-30.9, adult: Secondary | ICD-10-CM | POA: Diagnosis not present

## 2016-07-04 DIAGNOSIS — H02135 Senile ectropion of left lower eyelid: Secondary | ICD-10-CM | POA: Diagnosis not present

## 2016-07-04 DIAGNOSIS — H02132 Senile ectropion of right lower eyelid: Secondary | ICD-10-CM | POA: Diagnosis not present

## 2016-07-04 DIAGNOSIS — H02532 Eyelid retraction right lower eyelid: Secondary | ICD-10-CM | POA: Diagnosis not present

## 2016-07-04 DIAGNOSIS — H02535 Eyelid retraction left lower eyelid: Secondary | ICD-10-CM | POA: Diagnosis not present

## 2016-07-25 DIAGNOSIS — Z6829 Body mass index (BMI) 29.0-29.9, adult: Secondary | ICD-10-CM | POA: Diagnosis not present

## 2016-07-25 DIAGNOSIS — E7800 Pure hypercholesterolemia, unspecified: Secondary | ICD-10-CM | POA: Diagnosis not present

## 2016-07-25 DIAGNOSIS — M7989 Other specified soft tissue disorders: Secondary | ICD-10-CM | POA: Diagnosis not present

## 2016-07-25 DIAGNOSIS — S9030XA Contusion of unspecified foot, initial encounter: Secondary | ICD-10-CM | POA: Diagnosis not present

## 2016-07-25 DIAGNOSIS — I83819 Varicose veins of unspecified lower extremities with pain: Secondary | ICD-10-CM | POA: Diagnosis not present

## 2016-07-25 DIAGNOSIS — F332 Major depressive disorder, recurrent severe without psychotic features: Secondary | ICD-10-CM | POA: Diagnosis not present

## 2016-07-25 DIAGNOSIS — I4891 Unspecified atrial fibrillation: Secondary | ICD-10-CM | POA: Diagnosis not present

## 2016-07-25 DIAGNOSIS — E1165 Type 2 diabetes mellitus with hyperglycemia: Secondary | ICD-10-CM | POA: Diagnosis not present

## 2016-07-25 DIAGNOSIS — I10 Essential (primary) hypertension: Secondary | ICD-10-CM | POA: Diagnosis not present

## 2016-07-25 DIAGNOSIS — Z299 Encounter for prophylactic measures, unspecified: Secondary | ICD-10-CM | POA: Diagnosis not present

## 2016-07-25 DIAGNOSIS — M79671 Pain in right foot: Secondary | ICD-10-CM | POA: Diagnosis not present

## 2016-07-25 DIAGNOSIS — N400 Enlarged prostate without lower urinary tract symptoms: Secondary | ICD-10-CM | POA: Diagnosis not present

## 2016-07-28 DIAGNOSIS — E119 Type 2 diabetes mellitus without complications: Secondary | ICD-10-CM | POA: Diagnosis not present

## 2016-07-28 DIAGNOSIS — Z7901 Long term (current) use of anticoagulants: Secondary | ICD-10-CM | POA: Diagnosis not present

## 2016-07-28 DIAGNOSIS — I2510 Atherosclerotic heart disease of native coronary artery without angina pectoris: Secondary | ICD-10-CM | POA: Diagnosis not present

## 2016-07-28 DIAGNOSIS — Z79899 Other long term (current) drug therapy: Secondary | ICD-10-CM | POA: Diagnosis not present

## 2016-07-28 DIAGNOSIS — Z87891 Personal history of nicotine dependence: Secondary | ICD-10-CM | POA: Diagnosis not present

## 2016-07-28 DIAGNOSIS — I480 Paroxysmal atrial fibrillation: Secondary | ICD-10-CM | POA: Diagnosis not present

## 2016-07-28 DIAGNOSIS — H903 Sensorineural hearing loss, bilateral: Secondary | ICD-10-CM | POA: Diagnosis not present

## 2016-07-28 DIAGNOSIS — H6123 Impacted cerumen, bilateral: Secondary | ICD-10-CM | POA: Diagnosis not present

## 2016-07-28 DIAGNOSIS — Z7984 Long term (current) use of oral hypoglycemic drugs: Secondary | ICD-10-CM | POA: Diagnosis not present

## 2016-07-28 DIAGNOSIS — I10 Essential (primary) hypertension: Secondary | ICD-10-CM | POA: Diagnosis not present

## 2016-07-28 DIAGNOSIS — E039 Hypothyroidism, unspecified: Secondary | ICD-10-CM | POA: Diagnosis not present

## 2016-08-12 DIAGNOSIS — H02413 Mechanical ptosis of bilateral eyelids: Secondary | ICD-10-CM | POA: Diagnosis not present

## 2016-08-15 DIAGNOSIS — H9190 Unspecified hearing loss, unspecified ear: Secondary | ICD-10-CM | POA: Diagnosis not present

## 2016-08-18 DIAGNOSIS — I10 Essential (primary) hypertension: Secondary | ICD-10-CM | POA: Diagnosis not present

## 2016-08-18 DIAGNOSIS — Z299 Encounter for prophylactic measures, unspecified: Secondary | ICD-10-CM | POA: Diagnosis not present

## 2016-08-18 DIAGNOSIS — Z6830 Body mass index (BMI) 30.0-30.9, adult: Secondary | ICD-10-CM | POA: Diagnosis not present

## 2016-08-18 DIAGNOSIS — Z713 Dietary counseling and surveillance: Secondary | ICD-10-CM | POA: Diagnosis not present

## 2016-08-18 DIAGNOSIS — E1165 Type 2 diabetes mellitus with hyperglycemia: Secondary | ICD-10-CM | POA: Diagnosis not present

## 2016-08-22 DIAGNOSIS — M47812 Spondylosis without myelopathy or radiculopathy, cervical region: Secondary | ICD-10-CM | POA: Diagnosis not present

## 2016-08-22 DIAGNOSIS — M5442 Lumbago with sciatica, left side: Secondary | ICD-10-CM | POA: Diagnosis not present

## 2016-08-22 DIAGNOSIS — M546 Pain in thoracic spine: Secondary | ICD-10-CM | POA: Diagnosis not present

## 2016-08-22 DIAGNOSIS — M47816 Spondylosis without myelopathy or radiculopathy, lumbar region: Secondary | ICD-10-CM | POA: Diagnosis not present

## 2016-09-02 DIAGNOSIS — M170 Bilateral primary osteoarthritis of knee: Secondary | ICD-10-CM | POA: Diagnosis not present

## 2016-09-02 DIAGNOSIS — G8929 Other chronic pain: Secondary | ICD-10-CM | POA: Diagnosis not present

## 2016-09-02 DIAGNOSIS — M25562 Pain in left knee: Secondary | ICD-10-CM | POA: Diagnosis not present

## 2016-09-02 DIAGNOSIS — M25561 Pain in right knee: Secondary | ICD-10-CM | POA: Diagnosis not present

## 2016-09-09 DIAGNOSIS — I10 Essential (primary) hypertension: Secondary | ICD-10-CM | POA: Diagnosis not present

## 2016-09-09 DIAGNOSIS — Z713 Dietary counseling and surveillance: Secondary | ICD-10-CM | POA: Diagnosis not present

## 2016-09-09 DIAGNOSIS — Z299 Encounter for prophylactic measures, unspecified: Secondary | ICD-10-CM | POA: Diagnosis not present

## 2016-09-09 DIAGNOSIS — Z6830 Body mass index (BMI) 30.0-30.9, adult: Secondary | ICD-10-CM | POA: Diagnosis not present

## 2016-09-09 DIAGNOSIS — M79605 Pain in left leg: Secondary | ICD-10-CM | POA: Diagnosis not present

## 2016-09-09 DIAGNOSIS — M79662 Pain in left lower leg: Secondary | ICD-10-CM | POA: Diagnosis not present

## 2016-09-24 DIAGNOSIS — E7800 Pure hypercholesterolemia, unspecified: Secondary | ICD-10-CM | POA: Diagnosis not present

## 2016-09-24 DIAGNOSIS — N400 Enlarged prostate without lower urinary tract symptoms: Secondary | ICD-10-CM | POA: Diagnosis not present

## 2016-09-24 DIAGNOSIS — Z6829 Body mass index (BMI) 29.0-29.9, adult: Secondary | ICD-10-CM | POA: Diagnosis not present

## 2016-09-24 DIAGNOSIS — I10 Essential (primary) hypertension: Secondary | ICD-10-CM | POA: Diagnosis not present

## 2016-09-24 DIAGNOSIS — Z299 Encounter for prophylactic measures, unspecified: Secondary | ICD-10-CM | POA: Diagnosis not present

## 2016-09-24 DIAGNOSIS — E1165 Type 2 diabetes mellitus with hyperglycemia: Secondary | ICD-10-CM | POA: Diagnosis not present

## 2016-09-24 DIAGNOSIS — E0590 Thyrotoxicosis, unspecified without thyrotoxic crisis or storm: Secondary | ICD-10-CM | POA: Diagnosis not present

## 2016-10-06 DIAGNOSIS — J3489 Other specified disorders of nose and nasal sinuses: Secondary | ICD-10-CM | POA: Diagnosis not present

## 2016-10-06 DIAGNOSIS — Z299 Encounter for prophylactic measures, unspecified: Secondary | ICD-10-CM | POA: Diagnosis not present

## 2016-10-06 DIAGNOSIS — Z6829 Body mass index (BMI) 29.0-29.9, adult: Secondary | ICD-10-CM | POA: Diagnosis not present

## 2016-10-06 DIAGNOSIS — I10 Essential (primary) hypertension: Secondary | ICD-10-CM | POA: Diagnosis not present

## 2016-10-06 DIAGNOSIS — I4891 Unspecified atrial fibrillation: Secondary | ICD-10-CM | POA: Diagnosis not present

## 2016-10-14 DIAGNOSIS — I10 Essential (primary) hypertension: Secondary | ICD-10-CM | POA: Diagnosis not present

## 2016-10-14 DIAGNOSIS — G4700 Insomnia, unspecified: Secondary | ICD-10-CM | POA: Diagnosis not present

## 2016-10-14 DIAGNOSIS — Z299 Encounter for prophylactic measures, unspecified: Secondary | ICD-10-CM | POA: Diagnosis not present

## 2016-10-14 DIAGNOSIS — I4891 Unspecified atrial fibrillation: Secondary | ICD-10-CM | POA: Diagnosis not present

## 2016-10-14 DIAGNOSIS — Z6829 Body mass index (BMI) 29.0-29.9, adult: Secondary | ICD-10-CM | POA: Diagnosis not present

## 2016-10-14 DIAGNOSIS — J069 Acute upper respiratory infection, unspecified: Secondary | ICD-10-CM | POA: Diagnosis not present

## 2016-10-21 DIAGNOSIS — Z299 Encounter for prophylactic measures, unspecified: Secondary | ICD-10-CM | POA: Diagnosis not present

## 2016-10-21 DIAGNOSIS — Z713 Dietary counseling and surveillance: Secondary | ICD-10-CM | POA: Diagnosis not present

## 2016-10-21 DIAGNOSIS — R05 Cough: Secondary | ICD-10-CM | POA: Diagnosis not present

## 2016-10-21 DIAGNOSIS — Z6829 Body mass index (BMI) 29.0-29.9, adult: Secondary | ICD-10-CM | POA: Diagnosis not present

## 2016-10-21 DIAGNOSIS — J069 Acute upper respiratory infection, unspecified: Secondary | ICD-10-CM | POA: Diagnosis not present

## 2016-10-27 DIAGNOSIS — Z6829 Body mass index (BMI) 29.0-29.9, adult: Secondary | ICD-10-CM | POA: Diagnosis not present

## 2016-10-27 DIAGNOSIS — J189 Pneumonia, unspecified organism: Secondary | ICD-10-CM | POA: Diagnosis not present

## 2016-10-27 DIAGNOSIS — K219 Gastro-esophageal reflux disease without esophagitis: Secondary | ICD-10-CM | POA: Diagnosis not present

## 2016-10-27 DIAGNOSIS — E7800 Pure hypercholesterolemia, unspecified: Secondary | ICD-10-CM | POA: Diagnosis not present

## 2016-10-27 DIAGNOSIS — Z87891 Personal history of nicotine dependence: Secondary | ICD-10-CM | POA: Diagnosis not present

## 2016-10-27 DIAGNOSIS — Z299 Encounter for prophylactic measures, unspecified: Secondary | ICD-10-CM | POA: Diagnosis not present

## 2016-10-27 DIAGNOSIS — I4891 Unspecified atrial fibrillation: Secondary | ICD-10-CM | POA: Diagnosis not present

## 2016-11-04 DIAGNOSIS — E1165 Type 2 diabetes mellitus with hyperglycemia: Secondary | ICD-10-CM | POA: Diagnosis not present

## 2016-11-04 DIAGNOSIS — Z299 Encounter for prophylactic measures, unspecified: Secondary | ICD-10-CM | POA: Diagnosis not present

## 2016-11-04 DIAGNOSIS — J3489 Other specified disorders of nose and nasal sinuses: Secondary | ICD-10-CM | POA: Diagnosis not present

## 2016-11-04 DIAGNOSIS — F332 Major depressive disorder, recurrent severe without psychotic features: Secondary | ICD-10-CM | POA: Diagnosis not present

## 2016-11-04 DIAGNOSIS — E663 Overweight: Secondary | ICD-10-CM | POA: Diagnosis not present

## 2016-11-04 DIAGNOSIS — I4891 Unspecified atrial fibrillation: Secondary | ICD-10-CM | POA: Diagnosis not present

## 2016-11-04 DIAGNOSIS — H903 Sensorineural hearing loss, bilateral: Secondary | ICD-10-CM | POA: Diagnosis not present

## 2016-11-04 DIAGNOSIS — Z87891 Personal history of nicotine dependence: Secondary | ICD-10-CM | POA: Diagnosis not present

## 2016-11-04 DIAGNOSIS — J340 Abscess, furuncle and carbuncle of nose: Secondary | ICD-10-CM | POA: Diagnosis not present

## 2016-11-04 DIAGNOSIS — J189 Pneumonia, unspecified organism: Secondary | ICD-10-CM | POA: Diagnosis not present

## 2016-11-04 DIAGNOSIS — H6123 Impacted cerumen, bilateral: Secondary | ICD-10-CM | POA: Diagnosis not present

## 2016-11-11 DIAGNOSIS — J209 Acute bronchitis, unspecified: Secondary | ICD-10-CM | POA: Diagnosis not present

## 2016-11-11 DIAGNOSIS — Z299 Encounter for prophylactic measures, unspecified: Secondary | ICD-10-CM | POA: Diagnosis not present

## 2016-11-11 DIAGNOSIS — Z713 Dietary counseling and surveillance: Secondary | ICD-10-CM | POA: Diagnosis not present

## 2016-11-11 DIAGNOSIS — Z6830 Body mass index (BMI) 30.0-30.9, adult: Secondary | ICD-10-CM | POA: Diagnosis not present

## 2016-11-12 DIAGNOSIS — M67919 Unspecified disorder of synovium and tendon, unspecified shoulder: Secondary | ICD-10-CM | POA: Diagnosis not present

## 2016-11-12 DIAGNOSIS — M25512 Pain in left shoulder: Secondary | ICD-10-CM | POA: Diagnosis not present

## 2016-11-12 DIAGNOSIS — M719 Bursopathy, unspecified: Secondary | ICD-10-CM | POA: Diagnosis not present

## 2016-11-19 ENCOUNTER — Telehealth: Payer: Self-pay | Admitting: Cardiology

## 2016-11-19 NOTE — Telephone Encounter (Signed)
Patient notified

## 2016-11-19 NOTE — Telephone Encounter (Signed)
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 States that his BP was elevated. He states that the physician was concerned About his BP.     Please call  [phone].

## 2016-11-19 NOTE — Telephone Encounter (Signed)
Yes, I would recommend that he check his blood pressure once a day for a few weeks and make a log.  He could just as easily see his PCP Dr. Woody Seller about this as well.

## 2016-11-19 NOTE — Telephone Encounter (Signed)
Patient reports having his DOT physical on 11/5 and his BP was 146/80. Patient concerned because he almost didn't pass due to this. Patient states he has a way to check BP at home but has not checked BP since then. Patient reports taking all medications as prescribed. Patient denies any symptoms associated with this. Advised patient it would be difficult to make a decision or change based off 1 BP reading. Advised patient to check BP a couple times a week and if BP was steadily running high to give our office a call back. Patient verbalized understanding. Advised patient I would still send to Dr. Domenic Polite for any further recommendations.

## 2016-11-20 DIAGNOSIS — J3489 Other specified disorders of nose and nasal sinuses: Secondary | ICD-10-CM | POA: Diagnosis not present

## 2016-11-20 DIAGNOSIS — Z87891 Personal history of nicotine dependence: Secondary | ICD-10-CM | POA: Diagnosis not present

## 2016-11-26 DIAGNOSIS — Z7189 Other specified counseling: Secondary | ICD-10-CM | POA: Diagnosis not present

## 2016-11-26 DIAGNOSIS — Z0000 Encounter for general adult medical examination without abnormal findings: Secondary | ICD-10-CM | POA: Diagnosis not present

## 2016-11-26 DIAGNOSIS — Z1331 Encounter for screening for depression: Secondary | ICD-10-CM | POA: Diagnosis not present

## 2016-11-26 DIAGNOSIS — Z1339 Encounter for screening examination for other mental health and behavioral disorders: Secondary | ICD-10-CM | POA: Diagnosis not present

## 2016-11-26 DIAGNOSIS — E0590 Thyrotoxicosis, unspecified without thyrotoxic crisis or storm: Secondary | ICD-10-CM | POA: Diagnosis not present

## 2016-11-26 DIAGNOSIS — Z79899 Other long term (current) drug therapy: Secondary | ICD-10-CM | POA: Diagnosis not present

## 2016-11-26 DIAGNOSIS — R5383 Other fatigue: Secondary | ICD-10-CM | POA: Diagnosis not present

## 2016-11-26 DIAGNOSIS — Z6829 Body mass index (BMI) 29.0-29.9, adult: Secondary | ICD-10-CM | POA: Diagnosis not present

## 2016-11-26 DIAGNOSIS — Z125 Encounter for screening for malignant neoplasm of prostate: Secondary | ICD-10-CM | POA: Diagnosis not present

## 2016-11-26 DIAGNOSIS — Z299 Encounter for prophylactic measures, unspecified: Secondary | ICD-10-CM | POA: Diagnosis not present

## 2016-11-26 DIAGNOSIS — E7800 Pure hypercholesterolemia, unspecified: Secondary | ICD-10-CM | POA: Diagnosis not present

## 2016-12-08 DIAGNOSIS — E1165 Type 2 diabetes mellitus with hyperglycemia: Secondary | ICD-10-CM | POA: Diagnosis not present

## 2016-12-08 DIAGNOSIS — I10 Essential (primary) hypertension: Secondary | ICD-10-CM | POA: Diagnosis not present

## 2016-12-08 DIAGNOSIS — Z299 Encounter for prophylactic measures, unspecified: Secondary | ICD-10-CM | POA: Diagnosis not present

## 2016-12-08 DIAGNOSIS — Z713 Dietary counseling and surveillance: Secondary | ICD-10-CM | POA: Diagnosis not present

## 2016-12-08 DIAGNOSIS — Z6829 Body mass index (BMI) 29.0-29.9, adult: Secondary | ICD-10-CM | POA: Diagnosis not present

## 2016-12-22 DIAGNOSIS — J3489 Other specified disorders of nose and nasal sinuses: Secondary | ICD-10-CM | POA: Diagnosis not present

## 2016-12-22 DIAGNOSIS — H6121 Impacted cerumen, right ear: Secondary | ICD-10-CM | POA: Diagnosis not present

## 2017-01-07 DIAGNOSIS — Z6829 Body mass index (BMI) 29.0-29.9, adult: Secondary | ICD-10-CM | POA: Diagnosis not present

## 2017-01-07 DIAGNOSIS — I4891 Unspecified atrial fibrillation: Secondary | ICD-10-CM | POA: Diagnosis not present

## 2017-01-07 DIAGNOSIS — E1165 Type 2 diabetes mellitus with hyperglycemia: Secondary | ICD-10-CM | POA: Diagnosis not present

## 2017-01-07 DIAGNOSIS — Z299 Encounter for prophylactic measures, unspecified: Secondary | ICD-10-CM | POA: Diagnosis not present

## 2017-01-07 DIAGNOSIS — I10 Essential (primary) hypertension: Secondary | ICD-10-CM | POA: Diagnosis not present

## 2017-01-12 DIAGNOSIS — M170 Bilateral primary osteoarthritis of knee: Secondary | ICD-10-CM | POA: Diagnosis not present

## 2017-01-20 DIAGNOSIS — H40013 Open angle with borderline findings, low risk, bilateral: Secondary | ICD-10-CM | POA: Diagnosis not present

## 2017-01-26 NOTE — Progress Notes (Deleted)
Cardiology Office Note  Date: 01/26/2017   ID: EDINSON Matthews, DOB [birth year], MRN [id number]  PCP: Glenda Chroman, MD  Primary Cardiologist: Rozann Lesches, MD   No chief complaint on file.   History of Present Illness: Brett Matthews is an 84 y.o. male last seen in January 2018.  Past Medical History:  Diagnosis Date  . Anemia, iron deficiency    Negative capsule endoscopy  . Arthritis   . Coronary atherosclerosis of native coronary artery    60 % circumflex stenosis; EF 65%, Cath 6/13  . Diverticula of colon    Pancolonic  . DM (diabetes mellitus), type 2, uncontrolled ([place])   . Dyspnea    Normal cardiopulmonary function test in July 2008, negative echocardiogram with "bubble" study for inter-cardiac shunt.  . Essential hypertension, benign   . Gastroesophageal reflux disease   . Hemorrhoids   . Hyperplastic colon polyp 04/06/2007  . Hypothyroidism 2003   Status post total thyroidectomy  . Migraines   . Neoplasm of lymphatic and hematopoietic tissue   . Paroxysmal atrial fibrillation ([place])    Diagnosed 2005  . Skin cancer     Past Surgical History:  Procedure Laterality Date  . BACK SURGERY    . BONE MARROW BIOPSY  1990's  . COLONOSCOPY  04/06/2007   Dr. Gala Matthews- Normal rectum with scattered pancolonic diverticula and slightly redundant elongated colon, diminutive polpy midsigmoid, remainder of colonic mucosa appeared normal. bx= hyperplastic polyp  . COLONOSCOPY N/A 11/29/2012   [id number] preparation. Friable anal canal/internal hemorrhoids; otherwise, normal rectum. Normal-appearing colonic mucosa  . ESOPHAGOGASTRODUODENOSCOPY  04/06/2007   Dr. Gala Matthews- normal esophagus s/p nissen fundoplication, intact nissen wrap o/w normal stomach  . ESOPHAGOGASTRODUODENOSCOPY N/A 11/29/2012   RMR: Abnormall distal esophagus bx c/w GERD. prior fundoplication. Gastric polyps  -bx benign. Status post biopsy of normal--appearing duodenal mucosa (bx neg)  . LEFT HEART  CATHETERIZATION WITH CORONARY ANGIOGRAM N/A 06/27/2011   Procedure: LEFT HEART CATHETERIZATION WITH CORONARY ANGIOGRAM;  Surgeon: Brett Headings, MD;  Location: [facility] CATH LAB;  Service: Cardiovascular;  Laterality: N/A;  . NISSEN FUNDOPLICATION    . POSTERIOR LAMINECTOMY / DECOMPRESSION LUMBAR SPINE  ~ 1990's  . SKIN CANCER EXCISION     "off my back and chest; from sun"    Current Outpatient Medications  Medication Sig Dispense Refill  . amLODipine (NORVASC) 10 MG tablet Take 10 mg by mouth daily.      Marland Kitchen atorvastatin (LIPITOR) 40 MG tablet Take 40 mg by mouth daily.    . Azelastine HCl 0.15 % SOLN Place 1 spray into the nose as needed.    . benazepril (LOTENSIN) 5 MG tablet Take 5 mg by mouth daily.    . clonazePAM (KLONOPIN) 0.5 MG tablet Take 0.25 mg by mouth as needed.    . diclofenac (VOLTAREN) 75 MG EC tablet Take 1 tablet by mouth daily.     Marland Kitchen DYMISTA 137-50 MCG/ACT SUSP Place 1 spray into the nose daily.     . fexofenadine (ALLEGRA) 180 MG tablet Take 180 mg by mouth daily.    . folic acid (FOLVITE) 1 MG tablet Take 1 mg by mouth daily.      Marland Kitchen glucosamine-chondroitin 500-400 MG tablet Take 1 tablet by mouth 2 (two) times daily.      Marland Kitchen levothyroxine (SYNTHROID, LEVOTHROID) 125 MCG tablet Take 125 mcg by mouth daily before breakfast.    . lubiprostone (AMITIZA) 24 MCG capsule Take 24 mcg by mouth daily.    Marland Kitchen  Magnesium 250 MG TABS Take 1 tablet by mouth daily.    . metFORMIN (GLUCOPHAGE) 500 MG tablet Take 1 tablet (500 mg total) by mouth 2 (two) times daily with a meal. HOLD 48 hours, restart on 06/30/2011.    . metoprolol tartrate (LOPRESSOR) 25 MG tablet take 1 tablet by mouth twice a day 180 tablet 3  . pantoprazole (PROTONIX) 40 MG tablet Take 40 mg by mouth daily.      . potassium chloride (MICRO-K) 10 MEQ CR capsule Take 1 capsule by mouth daily.    . rivaroxaban (XARELTO) 20 MG TABS tablet Take 1 tablet (20 mg total) by mouth daily with supper. 20 tablet 0  . tamsulosin (FLOMAX)  0.4 MG CAPS capsule Take 0.4 mg by mouth daily after supper.      No current facility-administered medications for this visit.    Allergies:  Patient has no known allergies.   Social History: The patient  reports that  has never smoked. he has never used smokeless tobacco. He reports that he does not drink alcohol or use drugs.   Family History: The patient's family history includes Cancer in his unknown relative; Stroke in his unknown relative.   ROS:  Please see the history of present illness. Otherwise, complete review of systems is positive for {NONE DEFAULTED:18576::"none"}.  All other systems are reviewed and negative.   Physical Exam: VS:  There were no vitals taken for this visit., BMI There is no height or weight on file to calculate BMI.  Wt Readings from Last 3 Encounters:  01/25/16 205 lb (93 kg)  09/06/14 200 lb (90.7 kg)  09/05/14 202 lb (91.6 kg)    General: Patient appears comfortable at rest. HEENT: Conjunctiva and lids normal, oropharynx clear with moist mucosa. Neck: Supple, no elevated JVP or carotid bruits, no thyromegaly. Lungs: Clear to auscultation, nonlabored breathing at rest. Cardiac: Regular rate and rhythm, no S3 or significant systolic murmur, no pericardial rub. Abdomen: Soft, nontender, no hepatomegaly, bowel sounds present, no guarding or rebound. Extremities: No pitting edema, distal pulses 2+. Skin: Warm and dry. Musculoskeletal: No kyphosis. Neuropsychiatric: Alert and oriented x3, affect grossly appropriate.  ECG: I personally reviewed the tracing from 01/25/2016 which showed sinus rhythm with PACs and low voltage in limb leads.  Recent Labwork:  June 2015: TSH 0.79, hemoglobin 12.6, platelets 269, cholesterol 87, triglycerides 81, HDL 45, LDL 26, BUN 15, creatinine 0.8, potassium 4.2, AST 19, ALT 21, hemoglobin A1c 6.7  Other Studies Reviewed Today:  Echocardiogram [facility]) 12/20/2012: Mild LVH with LVEF 35-70%, diastolic  dysfunction, mild left atrial enlargement, upper normal RV chamber size, MAC with mild mitral regurgitation, sclerotic aortic valve with trace aortic regurgitation, mild tricuspid regurgitation with RVSP 25-35 mmHg.  Assessment and Plan:    Current medicines were reviewed with the patient today.  No orders of the defined types were placed in this encounter.   Disposition:  Signed, Satira Sark, MD, [facility] 01/26/2017 1:55 PM    [facility] at [place], [place], [place] [address] Phone: [phone]; Fax: [phone]

## 2017-01-27 ENCOUNTER — Encounter: Payer: Self-pay | Admitting: *Deleted

## 2017-01-28 ENCOUNTER — Ambulatory Visit: Payer: PPO | Admitting: Cardiology

## 2017-01-30 DIAGNOSIS — Z299 Encounter for prophylactic measures, unspecified: Secondary | ICD-10-CM | POA: Diagnosis not present

## 2017-01-30 DIAGNOSIS — Z6830 Body mass index (BMI) 30.0-30.9, adult: Secondary | ICD-10-CM | POA: Diagnosis not present

## 2017-01-30 DIAGNOSIS — E1165 Type 2 diabetes mellitus with hyperglycemia: Secondary | ICD-10-CM | POA: Diagnosis not present

## 2017-01-30 DIAGNOSIS — E039 Hypothyroidism, unspecified: Secondary | ICD-10-CM | POA: Diagnosis not present

## 2017-01-30 DIAGNOSIS — I83819 Varicose veins of unspecified lower extremities with pain: Secondary | ICD-10-CM | POA: Diagnosis not present

## 2017-01-30 DIAGNOSIS — I4891 Unspecified atrial fibrillation: Secondary | ICD-10-CM | POA: Diagnosis not present

## 2017-02-02 DIAGNOSIS — H6121 Impacted cerumen, right ear: Secondary | ICD-10-CM | POA: Diagnosis not present

## 2017-02-02 DIAGNOSIS — H903 Sensorineural hearing loss, bilateral: Secondary | ICD-10-CM | POA: Diagnosis not present

## 2017-02-04 DIAGNOSIS — M25561 Pain in right knee: Secondary | ICD-10-CM | POA: Diagnosis not present

## 2017-02-04 DIAGNOSIS — M170 Bilateral primary osteoarthritis of knee: Secondary | ICD-10-CM | POA: Diagnosis not present

## 2017-02-04 DIAGNOSIS — M719 Bursopathy, unspecified: Secondary | ICD-10-CM | POA: Diagnosis not present

## 2017-02-04 DIAGNOSIS — M67919 Unspecified disorder of synovium and tendon, unspecified shoulder: Secondary | ICD-10-CM | POA: Diagnosis not present

## 2017-02-04 DIAGNOSIS — G8929 Other chronic pain: Secondary | ICD-10-CM | POA: Diagnosis not present

## 2017-02-04 DIAGNOSIS — M25562 Pain in left knee: Secondary | ICD-10-CM | POA: Diagnosis not present

## 2017-02-20 DIAGNOSIS — Z87891 Personal history of nicotine dependence: Secondary | ICD-10-CM | POA: Diagnosis not present

## 2017-02-20 DIAGNOSIS — I4891 Unspecified atrial fibrillation: Secondary | ICD-10-CM | POA: Diagnosis not present

## 2017-02-20 DIAGNOSIS — I10 Essential (primary) hypertension: Secondary | ICD-10-CM | POA: Diagnosis not present

## 2017-02-20 DIAGNOSIS — S50811D Abrasion of right forearm, subsequent encounter: Secondary | ICD-10-CM | POA: Diagnosis not present

## 2017-02-20 DIAGNOSIS — Z6829 Body mass index (BMI) 29.0-29.9, adult: Secondary | ICD-10-CM | POA: Diagnosis not present

## 2017-02-20 DIAGNOSIS — Z299 Encounter for prophylactic measures, unspecified: Secondary | ICD-10-CM | POA: Diagnosis not present

## 2017-02-20 DIAGNOSIS — L089 Local infection of the skin and subcutaneous tissue, unspecified: Secondary | ICD-10-CM | POA: Diagnosis not present

## 2017-02-20 DIAGNOSIS — N400 Enlarged prostate without lower urinary tract symptoms: Secondary | ICD-10-CM | POA: Diagnosis not present

## 2017-02-25 DIAGNOSIS — K625 Hemorrhage of anus and rectum: Secondary | ICD-10-CM | POA: Diagnosis not present

## 2017-02-25 DIAGNOSIS — I10 Essential (primary) hypertension: Secondary | ICD-10-CM | POA: Diagnosis not present

## 2017-02-25 DIAGNOSIS — Z299 Encounter for prophylactic measures, unspecified: Secondary | ICD-10-CM | POA: Diagnosis not present

## 2017-02-25 DIAGNOSIS — Z6830 Body mass index (BMI) 30.0-30.9, adult: Secondary | ICD-10-CM | POA: Diagnosis not present

## 2017-02-25 DIAGNOSIS — E1165 Type 2 diabetes mellitus with hyperglycemia: Secondary | ICD-10-CM | POA: Diagnosis not present

## 2017-02-26 ENCOUNTER — Encounter (HOSPITAL_COMMUNITY): Payer: Self-pay | Admitting: Emergency Medicine

## 2017-02-26 ENCOUNTER — Emergency Department (HOSPITAL_COMMUNITY)
Admission: EM | Admit: 2017-02-26 | Discharge: 2017-02-26 | Disposition: A | Discharge status: Home / Self Care | Payer: PPO | Attending: Emergency Medicine | Admitting: Emergency Medicine

## 2017-02-26 ENCOUNTER — Telehealth: Payer: Self-pay | Admitting: Internal Medicine

## 2017-02-26 DIAGNOSIS — I10 Essential (primary) hypertension: Secondary | ICD-10-CM | POA: Insufficient documentation

## 2017-02-26 DIAGNOSIS — Z7984 Long term (current) use of oral hypoglycemic drugs: Secondary | ICD-10-CM | POA: Insufficient documentation

## 2017-02-26 DIAGNOSIS — K625 Hemorrhage of anus and rectum: Secondary | ICD-10-CM | POA: Diagnosis not present

## 2017-02-26 DIAGNOSIS — Z85828 Personal history of other malignant neoplasm of skin: Secondary | ICD-10-CM | POA: Insufficient documentation

## 2017-02-26 DIAGNOSIS — E119 Type 2 diabetes mellitus without complications: Secondary | ICD-10-CM | POA: Insufficient documentation

## 2017-02-26 DIAGNOSIS — E039 Hypothyroidism, unspecified: Secondary | ICD-10-CM | POA: Diagnosis not present

## 2017-02-26 DIAGNOSIS — I2510 Atherosclerotic heart disease of native coronary artery without angina pectoris: Secondary | ICD-10-CM | POA: Insufficient documentation

## 2017-02-26 LAB — BASIC METABOLIC PANEL
Anion gap: 9 (ref 5–15)
BUN: 11 mg/dL (ref 6–20)
CHLORIDE: 102 mmol/L (ref 101–111)
CO2: 24 mmol/L (ref 22–32)
Calcium: 9.3 mg/dL (ref 8.9–10.3)
Creatinine, Ser: 0.84 mg/dL (ref 0.61–1.24)
GFR calc Af Amer: >60 mL/min (ref >60)
GFR calc non Af Amer: >60 mL/min (ref >60)
Glucose, Bld: 130 mg/dL — ABNORMAL HIGH (ref 65–99)
POTASSIUM: 3.8 mmol/L (ref 3.5–5.1)
SODIUM: 135 mmol/L (ref 135–145)

## 2017-02-26 LAB — CBC WITH DIFFERENTIAL/PLATELET
Basophils Absolute: 0.1 10*3/uL (ref 0.0–0.1)
Basophils Relative: 1 %
EOS ABS: 0.1 10*3/uL (ref 0.0–0.7)
Eosinophils Relative: 2 %
HCT: 39.1 % (ref 39.0–52.0)
HEMOGLOBIN: 12.5 g/dL — AB (ref 13.0–17.0)
Lymphocytes Relative: 18 %
Lymphs Abs: 0.9 10*3/uL (ref 0.7–4.0)
MCH: 28 pg (ref 26.0–34.0)
MCHC: 32 g/dL (ref 30.0–36.0)
MCV: 87.5 fL (ref 78.0–100.0)
Monocytes Absolute: 0.5 10*3/uL (ref 0.1–1.0)
Monocytes Relative: 9 %
NEUTROS PCT: 70 %
Neutro Abs: 3.7 10*3/uL (ref 1.7–7.7)
Platelets: 322 10*3/uL (ref 150–400)
RBC: 4.47 MIL/uL (ref 4.22–5.81)
RDW: 13.9 % (ref 11.5–15.5)
WBC: 5.2 10*3/uL (ref 4.0–10.5)

## 2017-02-26 NOTE — Discharge Instructions (Signed)
Call Dr. Oneida Alar office tomorrow morning and let them know how you are doing with your bleeding.  Also they will most likely make an appointment next week for recheck.  Also follow-up with your heart doctor next week and let him know about the bleeding

## 2017-02-26 NOTE — ED Triage Notes (Signed)
Patient complaining of rectal bleeding x 4-5 days. States he has history of hemorrhoids.

## 2017-02-26 NOTE — Telephone Encounter (Signed)
Pt called to say that he was passing blood and needed to be seen. Before I could offer him an OV he decided to go the ER.

## 2017-02-26 NOTE — Telephone Encounter (Signed)
Spoke with pt and he is headed to the ER with complaints of bleeding for further evaluation. Pt will call back if needed.

## 2017-02-26 NOTE — ED Provider Notes (Signed)
[facility] EMERGENCY DEPARTMENT Provider Note   CSN: [id number] Arrival date & time: 02/26/17  1516     History   Chief Complaint Chief Complaint  Patient presents with  . Rectal Bleeding    HPI Brett Matthews is a 84 y.o. male.  Patient states that he has been having bright red blood from rectum for a few days.  His family doctor had him stop taking his Xarelto.   The history is provided by the patient.  Rectal Bleeding  Quality:  Bright red Amount:  Moderate Timing:  Constant Chronicity:  New Context: not anal fissures   Associated symptoms: no abdominal pain     Past Medical History:  Diagnosis Date  . Anemia, iron deficiency    Negative capsule endoscopy  . Arthritis   . Coronary atherosclerosis of native coronary artery    60 % circumflex stenosis; EF 65%, Cath 6/13  . Diverticula of colon    Pancolonic  . DM (diabetes mellitus), type 2, uncontrolled ([place])   . Dyspnea    Normal cardiopulmonary function test in July 2008, negative echocardiogram with "bubble" study for inter-cardiac shunt.  . Essential hypertension, benign   . Gastroesophageal reflux disease   . Hemorrhoids   . Hyperplastic colon polyp 04/06/2007  . Hypothyroidism 2003   Status post total thyroidectomy  . Migraines   . Neoplasm of lymphatic and hematopoietic tissue   . Paroxysmal atrial fibrillation ([place])    Diagnosed 2005  . Skin cancer     Patient Active Problem List   Diagnosis Date Noted  . Rectal bleeding 03/17/2013  . Dyslipidemia 07/24/2010  . CAD, NATIVE VESSEL 09/26/2008  . HYPOTHYROIDISM 08/06/2007  . DIABETES MELLITUS, TYPE II, UNCONTROLLED 08/06/2007  . Essential hypertension, benign 08/06/2007  . GASTROESOPHAGEAL REFLUX DISEASE, CHRONIC 08/06/2007  . Paroxysmal atrial fibrillation ([place]) 08/06/2007    Past Surgical History:  Procedure Laterality Date  . BACK SURGERY    . BONE MARROW BIOPSY  1990's  . COLONOSCOPY  04/06/2007   Dr. Gala Romney- Normal rectum with  scattered pancolonic diverticula and slightly redundant elongated colon, diminutive polpy midsigmoid, remainder of colonic mucosa appeared normal. bx= hyperplastic polyp  . COLONOSCOPY N/A 11/29/2012   [id number] preparation. Friable anal canal/internal hemorrhoids; otherwise, normal rectum. Normal-appearing colonic mucosa  . ESOPHAGOGASTRODUODENOSCOPY  04/06/2007   Dr. Gala Romney- normal esophagus s/p nissen fundoplication, intact nissen wrap o/w normal stomach  . ESOPHAGOGASTRODUODENOSCOPY N/A 11/29/2012   RMR: Abnormall distal esophagus bx c/w GERD. prior fundoplication. Gastric polyps  -bx benign. Status post biopsy of normal--appearing duodenal mucosa (bx neg)  . LEFT HEART CATHETERIZATION WITH CORONARY ANGIOGRAM N/A 06/27/2011   Procedure: LEFT HEART CATHETERIZATION WITH CORONARY ANGIOGRAM;  Surgeon: Thayer Headings, MD;  Location: [facility] CATH LAB;  Service: Cardiovascular;  Laterality: N/A;  . NISSEN FUNDOPLICATION    . POSTERIOR LAMINECTOMY / DECOMPRESSION LUMBAR SPINE  ~ 1990's  . SKIN CANCER EXCISION     "off my back and chest; from sun"       Home Medications    Prior to Admission medications   Medication Sig Start Date End Date Taking? Authorizing Provider  amLODipine (NORVASC) 10 MG tablet Take 10 mg by mouth daily.     Yes [provider]  atorvastatin (LIPITOR) 40 MG tablet Take 40 mg by mouth daily.   Yes [provider]  Azelastine HCl 0.15 % SOLN Place 1 spray into the nose as needed. 01/12/13  Yes [provider]  benazepril (LOTENSIN) 5 MG tablet  Take 5 mg by mouth daily.   Yes [provider]  carvedilol (COREG) 12.5 MG tablet carvedilol 12.5 mg tablet daily 10/30/15  Yes [provider]  diclofenac (VOLTAREN) 75 MG EC tablet Take 1 tablet by mouth daily.  07/04/10  Yes [provider]  DYMISTA 137-50 MCG/ACT SUSP Place 1 spray into the nose daily.  09/07/12  Yes [provider]  levothyroxine (SYNTHROID, LEVOTHROID)  125 MCG tablet Take 125 mcg by mouth daily before breakfast.   Yes [provider]  metFORMIN (GLUCOPHAGE) 500 MG tablet Take 1 tablet (500 mg total) by mouth 2 (two) times daily with a meal. HOLD 48 hours, restart on 06/30/2011. 06/27/11  Yes Barrett, Evelene Croon, PA-C  metoprolol tartrate (LOPRESSOR) 25 MG tablet take 1 tablet by mouth twice a day 12/21/12  Yes Satira Sark, MD  pantoprazole (PROTONIX) 40 MG tablet Take 40 mg by mouth daily.     Yes [provider]  potassium chloride (MICRO-K) 10 MEQ CR capsule Take 1 capsule by mouth daily.   Yes [provider]  tamsulosin (FLOMAX) 0.4 MG CAPS capsule Take 0.4 mg by mouth daily after supper.  09/09/12  Yes [provider]    Family History Family History  Problem Relation Age of Onset  . Cancer Unknown   . Stroke Unknown     Social History Social History   Tobacco Use  . Smoking status: Never Smoker  . Smokeless tobacco: Never Used  Substance Use Topics  . Alcohol use: No  . Drug use: No     Allergies   Patient has no known allergies.   Review of Systems Review of Systems  Constitutional: Negative for appetite change and fatigue.  HENT: Negative for congestion, ear discharge and sinus pressure.   Eyes: Negative for discharge.  Respiratory: Negative for cough.   Cardiovascular: Negative for chest pain.  Gastrointestinal: Positive for hematochezia. Negative for abdominal pain and diarrhea.       Rectal bleeding  Genitourinary: Negative for frequency and hematuria.  Musculoskeletal: Negative for back pain.  Skin: Negative for rash.  Neurological: Negative for seizures and headaches.  Psychiatric/Behavioral: Negative for hallucinations.     Physical Exam Updated Vital Signs BP (!) 157/81 (BP Location: Right Arm)   Pulse 71   Temp 98.2 F (36.8 C) (Oral)   Resp 19   Ht '5\' 10"'$  (1.778 m)   Wt 93 kg (205 lb)   SpO2 99%   BMI 29.41 kg/m   Physical Exam  Constitutional: He is  oriented to person, place, and time. He appears well-developed.  HENT:  Head: Normocephalic.  Eyes: Conjunctivae and EOM are normal. No scleral icterus.  Neck: Neck supple. No thyromegaly present.  Cardiovascular: Normal rate and regular rhythm. Exam reveals no gallop and no friction rub.  No murmur heard. Pulmonary/Chest: No stridor. He has no wheezes. He has no rales. He exhibits no tenderness.  Abdominal: He exhibits no distension. There is no tenderness. There is no rebound.  Genitourinary:  Genitourinary Comments: No blood in rectum  Musculoskeletal: Normal range of motion. He exhibits no edema.  Lymphadenopathy:    He has no cervical adenopathy.  Neurological: He is oriented to person, place, and time. He exhibits normal muscle tone. Coordination normal.  Skin: No rash noted. No erythema.  Psychiatric: He has a normal mood and affect. His behavior is normal.     ED Treatments / Results  Labs (all labs ordered are listed, but only abnormal  results are displayed) Labs Reviewed  CBC WITH DIFFERENTIAL/PLATELET - Abnormal; Notable for the following components:      Result Value   Hemoglobin 12.5 (*)    All other components within normal limits  BASIC METABOLIC PANEL - Abnormal; Notable for the following components:   Glucose, Bld 130 (*)    All other components within normal limits    EKG  EKG Interpretation None       Radiology No results found.  Procedures Procedures (including critical care time)  Medications Ordered in ED Medications - No data to display   Initial Impression / Assessment and Plan / ED Course  I have reviewed the triage vital signs and the nursing notes.  Pertinent labs & imaging results that were available during my care of the patient were reviewed by me and considered in my medical decision making (see chart for details).     I spoke with the GI doctor Dr. Oneida Alar and she will follow-up with the patient.  Patient hemoglobin is 14.4 his  vital signs are normal.  He will call the GI office tomorrow to arrange follow-up and return if any problems.  He will stay off the Xarelto for now and follow-up with his cardiologist also  Final Clinical Impressions(s) / ED Diagnoses   Final diagnoses:  Rectal bleeding    ED Discharge Orders    None       Milton Ferguson, MD 02/26/17 1750

## 2017-02-27 NOTE — Telephone Encounter (Signed)
Pt need OPV next week for rectal bleeding.

## 2017-02-27 NOTE — Telephone Encounter (Signed)
PATIENT COMING Monday FOR OV WITH Brett Matthews

## 2017-02-27 NOTE — Telephone Encounter (Signed)
Spoke with patient and he is calling to inform [facility] that he did go to the ER yesterday

## 2017-03-02 ENCOUNTER — Other Ambulatory Visit: Payer: Self-pay | Admitting: *Deleted

## 2017-03-02 ENCOUNTER — Ambulatory Visit (INDEPENDENT_AMBULATORY_CARE_PROVIDER_SITE_OTHER): Payer: PPO | Admitting: Nurse Practitioner

## 2017-03-02 ENCOUNTER — Telehealth: Payer: Self-pay | Admitting: *Deleted

## 2017-03-02 ENCOUNTER — Encounter: Payer: Self-pay | Admitting: Nurse Practitioner

## 2017-03-02 ENCOUNTER — Encounter: Payer: Self-pay | Admitting: *Deleted

## 2017-03-02 ENCOUNTER — Telehealth: Payer: Self-pay

## 2017-03-02 VITALS — BP 147/87 | HR 79 | Temp 96.9°F | Ht 69.0 in | Wt 201.4 lb

## 2017-03-02 DIAGNOSIS — D649 Anemia, unspecified: Secondary | ICD-10-CM

## 2017-03-02 DIAGNOSIS — K625 Hemorrhage of anus and rectum: Secondary | ICD-10-CM

## 2017-03-02 DIAGNOSIS — R1030 Lower abdominal pain, unspecified: Secondary | ICD-10-CM | POA: Diagnosis not present

## 2017-03-02 DIAGNOSIS — R109 Unspecified abdominal pain: Secondary | ICD-10-CM | POA: Insufficient documentation

## 2017-03-02 LAB — CBC WITH DIFFERENTIAL/PLATELET
BASOS ABS: 71 {cells}/uL (ref 0–200)
Basophils Relative: 1 %
Eosinophils Absolute: 121 cells/uL (ref 15–500)
Eosinophils Relative: 1.7 %
HCT: 37.3 % — ABNORMAL LOW (ref 38.5–50.0)
Hemoglobin: 12.4 g/dL — ABNORMAL LOW (ref 13.2–17.1)
Lymphs Abs: 1122 cells/uL (ref 850–3900)
MCH: 28.1 pg (ref 27.0–33.0)
MCHC: 33.2 g/dL (ref 32.0–36.0)
MCV: 84.6 fL (ref 80.0–100.0)
MONOS PCT: 9.2 %
MPV: 9.6 fL (ref 7.5–12.5)
Neutro Abs: 5133 cells/uL (ref 1500–7800)
Neutrophils Relative %: 72.3 %
PLATELETS: 303 10*3/uL (ref 140–400)
RBC: 4.41 10*6/uL (ref 4.20–5.80)
RDW: 13.1 % (ref 11.0–15.0)
TOTAL LYMPHOCYTE: 15.8 %
WBC mixed population: 653 cells/uL (ref 200–950)
WBC: 7.1 10*3/uL (ref 3.8–10.8)

## 2017-03-02 MED ORDER — PEG 3350-KCL-NA BICARB-NACL 420 G PO SOLR: 4000.0000 mL | Freq: Once | ORAL | 0 refills | Status: AC

## 2017-03-02 NOTE — Telephone Encounter (Signed)
[facility] called and they are faxing stat lab results. Hgb 12.4, Hematocrit 37.3. Other values are within normal limits. Placing results on EG's desk.   Also forwarding message to AB since EG has left the office for today.

## 2017-03-02 NOTE — Assessment & Plan Note (Signed)
The patient does have a history of anemia.  His hemoglobin seems to be stable compared to his last draw 5 years ago.  His hemoglobin was 12.5 last week.  Given his persistent ongoing blood loss, we will recheck his CBC stat.  If he has had a precipitous decline we can attempt to arrange for transfusion.  Otherwise, continue to follow.  Further recommendations as per above.

## 2017-03-02 NOTE — H&P (View-Only) (Signed)
Referring Provider: Glenda Chroman, MD Primary Care Physician:  Glenda Chroman, MD Primary GI:  Dr. Gala Romney  Chief Complaint  Patient presents with  . Rectal Bleeding  . Hemorrhoids    HPI:   Brett Matthews is a 84 y.o. male who presents for evaluation of rectal bleeding on an urgent office visit.  Patient was last seen in our office 09/05/2014 for GERD and constipation.  Noted chronic history of both, doing well on Linzess 290 and Protonix.  Rectal bleeding had tapered off at that point.  Recommended continue current medications follow-up in 6 months which does not appear to of abdomen.  The patient called our office 02/26/2017 complaining of rectal bleeding.  He proceeded to the emergency room of his own accord.  There he noted bright red blood per rectum for a few days, primary care advised him to stop Xarelto at that time.  Is described as a moderate amount and without abdominal pain.  CBC showed a low, but stable, hemoglobin of 12.5.  They spoke with the gastroenterologist on call.  It was determined the patient would follow-up with our office as an outpatient, ER return precautions given.  They recommended he stay off Xarelto for now and follow-up with cardiology as well.  Last colonoscopy dated 11/29/2012 which found friable hemorrhoids as likely cause of heme positive stool.  Noted patient may benefit from hemorrhoid banding but chronic anticoagulation therapy may make this more challenging.  EGD at the same time found abnormal distal esophagus status post biopsy, status post prior fundoplication, gastric polyps status post biopsy.  Surgical pathology found the biopsies to be unremarkable mucosa, fundic gland polyp, and chronic gastritis.  No recommendation for follow-up colonoscopy.  Today he states he's doing ok overall. He states rectal bleeding became worse in the past couple weeks. Unhappy with ER visit because "I paid $100 for them to not do anything." Bleeding is described as  "dripping blood" with all bowel movements, often without bowel movement. Has some lower abdominal pain. Has had some dizziness as well. Denies N/V, melena, unintentional weight loss, fever, chills. Denies chest pain, dyspnea, syncope, near syncope. Denies any other upper or lower GI symptoms.  Is not taking Xarelto at this time. He tried to notify cardiology on Friday but they weren't open. He will try today. Rare NSAIDs.  Past Medical History:  Diagnosis Date  . Anemia, iron deficiency    Negative capsule endoscopy  . Arthritis   . Coronary atherosclerosis of native coronary artery    60 % circumflex stenosis; EF 65%, Cath 6/13  . Diverticula of colon    Pancolonic  . DM (diabetes mellitus), type 2, uncontrolled ([place])   . Dyspnea    Normal cardiopulmonary function test in July 2008, negative echocardiogram with "bubble" study for inter-cardiac shunt.  . Essential hypertension, benign   . Gastroesophageal reflux disease   . Hemorrhoids   . Hyperplastic colon polyp 04/06/2007  . Hypothyroidism 2003   Status post total thyroidectomy  . Migraines   . Neoplasm of lymphatic and hematopoietic tissue   . Paroxysmal atrial fibrillation ([place])    Diagnosed 2005  . Skin cancer     Past Surgical History:  Procedure Laterality Date  . BACK SURGERY    . BONE MARROW BIOPSY  1990's  . COLONOSCOPY  04/06/2007   Dr. Gala Romney- Normal rectum with scattered pancolonic diverticula and slightly redundant elongated colon, diminutive polpy midsigmoid, remainder of colonic mucosa appeared normal. bx= hyperplastic polyp  .  COLONOSCOPY N/A 11/29/2012   [id number] preparation. Friable anal canal/internal hemorrhoids; otherwise, normal rectum. Normal-appearing colonic mucosa  . ESOPHAGOGASTRODUODENOSCOPY  04/06/2007   Dr. Gala Romney- normal esophagus s/p nissen fundoplication, intact nissen wrap o/w normal stomach  . ESOPHAGOGASTRODUODENOSCOPY N/A 11/29/2012   RMR: Abnormall distal esophagus bx c/w GERD. prior  fundoplication. Gastric polyps  -bx benign. Status post biopsy of normal--appearing duodenal mucosa (bx neg)  . LEFT HEART CATHETERIZATION WITH CORONARY ANGIOGRAM N/A 06/27/2011   Procedure: LEFT HEART CATHETERIZATION WITH CORONARY ANGIOGRAM;  Surgeon: Thayer Headings, MD;  Location: [facility] CATH LAB;  Service: Cardiovascular;  Laterality: N/A;  . NISSEN FUNDOPLICATION    . POSTERIOR LAMINECTOMY / DECOMPRESSION LUMBAR SPINE  ~ 1990's  . SKIN CANCER EXCISION     "off my back and chest; from sun"    Current Outpatient Medications  Medication Sig Dispense Refill  . amLODipine (NORVASC) 10 MG tablet Take 10 mg by mouth daily.      Marland Kitchen atorvastatin (LIPITOR) 40 MG tablet Take 40 mg by mouth daily.    . Azelastine HCl 0.15 % SOLN Place 1 spray into the nose as needed.    . benazepril (LOTENSIN) 5 MG tablet Take 5 mg by mouth daily.    . carvedilol (COREG) 12.5 MG tablet carvedilol 12.5 mg tablet daily    . diclofenac (VOLTAREN) 75 MG EC tablet Take 1 tablet by mouth daily.     Marland Kitchen DYMISTA 137-50 MCG/ACT SUSP Place 1 spray into the nose daily.     Marland Kitchen levothyroxine (SYNTHROID, LEVOTHROID) 125 MCG tablet Take 125 mcg by mouth daily before breakfast.    . metFORMIN (GLUCOPHAGE) 500 MG tablet Take 1 tablet (500 mg total) by mouth 2 (two) times daily with a meal. HOLD 48 hours, restart on 06/30/2011.    . metoprolol tartrate (LOPRESSOR) 25 MG tablet take 1 tablet by mouth twice a day 180 tablet 3  . pantoprazole (PROTONIX) 40 MG tablet Take 40 mg by mouth daily.      . potassium chloride (MICRO-K) 10 MEQ CR capsule Take 1 capsule by mouth daily.    . tamsulosin (FLOMAX) 0.4 MG CAPS capsule Take 0.4 mg by mouth daily after supper.      No current facility-administered medications for this visit.     Allergies as of 03/02/2017  . (No Known Allergies)    Family History  Problem Relation Age of Onset  . Cancer Unknown   . Stroke Unknown   . Colon cancer Neg Hx     Social History   Socioeconomic History    . Marital status: Widowed    Spouse name: None  . Number of children: None  . Years of education: None  . Highest education level: None  Social Needs  . Financial resource strain: None  . Food insecurity - worry: None  . Food insecurity - inability: None  . Transportation needs - medical: None  . Transportation needs - non-medical: None  Occupational History  . Occupation: Full Time  Tobacco Use  . Smoking status: Never Smoker  . Smokeless tobacco: Never Used  Substance and Sexual Activity  . Alcohol use: No  . Drug use: No  . Sexual activity: No  Other Topics Concern  . None  Social History Narrative  . None    Review of Systems: General: Negative for anorexia, weight loss, fever, chills, fatigue, weakness. ENT: Negative for hoarseness, difficulty swallowing , nasal congestion. CV: Negative for chest pain, angina, palpitations, dyspnea on exertion, peripheral edema.  Respiratory: Negative for dyspnea at rest, dyspnea on exertion, cough, sputum, wheezing.  GI: See history of present illness. MS: Negative for joint pain, low back pain.  Derm: Negative for rash or itching.  Endo: Negative for unusual weight change.  Heme: Negative for bruising or bleeding. Allergy: Negative for rash or hives.   Physical Exam: BP (!) 147/87   Pulse 79   Temp (!) 96.9 F (36.1 C) (Oral)   Ht '5\' 9"'$  (1.753 m)   Wt 201 lb 6.4 oz (91.4 kg)   BMI 29.74 kg/m  General:   Alert and oriented. Pleasant and cooperative. Well-nourished and well-developed.  Eyes:  Without icterus, sclera clear and conjunctiva pink.  Ears:  Normal auditory acuity. Cardiovascular:  S1, S2 present without murmurs appreciated. Extremities without clubbing or edema. Respiratory:  Clear to auscultation bilaterally. No wheezes, rales, or rhonchi. No distress.  Gastrointestinal:  +BS, soft, non-tender and non-distended. No HSM noted. No guarding or rebound. No masses appreciated.  Rectal:  Deferred  Musculoskalatal:   Symmetrical without gross deformities. Neurologic:  Alert and oriented x4;  grossly normal neurologically. Psych:  Alert and cooperative. Normal mood and affect. Heme/Lymph/Immune: No excessive bruising noted.    03/02/2017 9:23 AM   Disclaimer: This note was dictated with voice recognition software. Similar sounding words can inadvertently be transcribed and may not be corrected upon review.

## 2017-03-02 NOTE — Assessment & Plan Note (Signed)
Patient describes some lower abdominal crampy pain associated with his rectal bleeding.  This is been ongoing for the past couple weeks.  This makes diverticular bleed less likely.  He does have a history of chronic constipation but appears to be having normal bowel movements per discussion.  At this point we will proceed with colonoscopy as per above.  Follow-up in 2 months.

## 2017-03-02 NOTE — Progress Notes (Signed)
Referring Provider: Glenda Chroman, MD Primary Care Physician:  Glenda Chroman, MD Primary GI:  Dr. Gala Romney  Chief Complaint  Patient presents with  . Rectal Bleeding  . Hemorrhoids    HPI:   Brett Matthews is a 84 y.o. male who presents for evaluation of rectal bleeding on an urgent office visit.  Patient was last seen in our office 09/05/2014 for GERD and constipation.  Noted chronic history of both, doing well on Linzess 290 and Protonix.  Rectal bleeding had tapered off at that point.  Recommended continue current medications follow-up in 6 months which does not appear to of abdomen.  The patient called our office 02/26/2017 complaining of rectal bleeding.  He proceeded to the emergency room of his own accord.  There he noted bright red blood per rectum for a few days, primary care advised him to stop Xarelto at that time.  Is described as a moderate amount and without abdominal pain.  CBC showed a low, but stable, hemoglobin of 12.5.  They spoke with the gastroenterologist on call.  It was determined the patient would follow-up with our office as an outpatient, ER return precautions given.  They recommended he stay off Xarelto for now and follow-up with cardiology as well.  Last colonoscopy dated 11/29/2012 which found friable hemorrhoids as likely cause of heme positive stool.  Noted patient may benefit from hemorrhoid banding but chronic anticoagulation therapy may make this more challenging.  EGD at the same time found abnormal distal esophagus status post biopsy, status post prior fundoplication, gastric polyps status post biopsy.  Surgical pathology found the biopsies to be unremarkable mucosa, fundic gland polyp, and chronic gastritis.  No recommendation for follow-up colonoscopy.  Today he states he's doing ok overall. He states rectal bleeding became worse in the past couple weeks. Unhappy with ER visit because "I paid $100 for them to not do anything." Bleeding is described as  "dripping blood" with all bowel movements, often without bowel movement. Has some lower abdominal pain. Has had some dizziness as well. Denies N/V, melena, unintentional weight loss, fever, chills. Denies chest pain, dyspnea, syncope, near syncope. Denies any other upper or lower GI symptoms.  Is not taking Xarelto at this time. He tried to notify cardiology on Friday but they weren't open. He will try today. Rare NSAIDs.  Past Medical History:  Diagnosis Date  . Anemia, iron deficiency    Negative capsule endoscopy  . Arthritis   . Coronary atherosclerosis of native coronary artery    60 % circumflex stenosis; EF 65%, Cath 6/13  . Diverticula of colon    Pancolonic  . DM (diabetes mellitus), type 2, uncontrolled ([place])   . Dyspnea    Normal cardiopulmonary function test in July 2008, negative echocardiogram with "bubble" study for inter-cardiac shunt.  . Essential hypertension, benign   . Gastroesophageal reflux disease   . Hemorrhoids   . Hyperplastic colon polyp 04/06/2007  . Hypothyroidism 2003   Status post total thyroidectomy  . Migraines   . Neoplasm of lymphatic and hematopoietic tissue   . Paroxysmal atrial fibrillation ([place])    Diagnosed 2005  . Skin cancer     Past Surgical History:  Procedure Laterality Date  . BACK SURGERY    . BONE MARROW BIOPSY  1990's  . COLONOSCOPY  04/06/2007   Dr. Gala Romney- Normal rectum with scattered pancolonic diverticula and slightly redundant elongated colon, diminutive polpy midsigmoid, remainder of colonic mucosa appeared normal. bx= hyperplastic polyp  .  COLONOSCOPY N/A 11/29/2012   WUJ:[id number] preparation. Friable anal canal/internal hemorrhoids; otherwise, normal rectum. Normal-appearing colonic mucosa  . ESOPHAGOGASTRODUODENOSCOPY  04/06/2007   Dr. Gala Romney- normal esophagus s/p nissen fundoplication, intact nissen wrap o/w normal stomach  . ESOPHAGOGASTRODUODENOSCOPY N/A 11/29/2012   RMR: Abnormall distal esophagus bx c/w GERD. prior  fundoplication. Gastric polyps  -bx benign. Status post biopsy of normal--appearing duodenal mucosa (bx neg)  . LEFT HEART CATHETERIZATION WITH CORONARY ANGIOGRAM N/A 06/27/2011   Procedure: LEFT HEART CATHETERIZATION WITH CORONARY ANGIOGRAM;  Surgeon: Thayer Headings, MD;  Location: [facility] CATH LAB;  Service: Cardiovascular;  Laterality: N/A;  . NISSEN FUNDOPLICATION    . POSTERIOR LAMINECTOMY / DECOMPRESSION LUMBAR SPINE  ~ 1990's  . SKIN CANCER EXCISION     "off my back and chest; from sun"    Current Outpatient Medications  Medication Sig Dispense Refill  . amLODipine (NORVASC) 10 MG tablet Take 10 mg by mouth daily.      Marland Kitchen atorvastatin (LIPITOR) 40 MG tablet Take 40 mg by mouth daily.    . Azelastine HCl 0.15 % SOLN Place 1 spray into the nose as needed.    . benazepril (LOTENSIN) 5 MG tablet Take 5 mg by mouth daily.    . carvedilol (COREG) 12.5 MG tablet carvedilol 12.5 mg tablet daily    . diclofenac (VOLTAREN) 75 MG EC tablet Take 1 tablet by mouth daily.     Marland Kitchen DYMISTA 137-50 MCG/ACT SUSP Place 1 spray into the nose daily.     Marland Kitchen levothyroxine (SYNTHROID, LEVOTHROID) 125 MCG tablet Take 125 mcg by mouth daily before breakfast.    . metFORMIN (GLUCOPHAGE) 500 MG tablet Take 1 tablet (500 mg total) by mouth 2 (two) times daily with a meal. HOLD 48 hours, restart on 06/30/2011.    . metoprolol tartrate (LOPRESSOR) 25 MG tablet take 1 tablet by mouth twice a day 180 tablet 3  . pantoprazole (PROTONIX) 40 MG tablet Take 40 mg by mouth daily.      . potassium chloride (MICRO-K) 10 MEQ CR capsule Take 1 capsule by mouth daily.    . tamsulosin (FLOMAX) 0.4 MG CAPS capsule Take 0.4 mg by mouth daily after supper.      No current facility-administered medications for this visit.     Allergies as of 03/02/2017  . (No Known Allergies)    Family History  Problem Relation Age of Onset  . Cancer Unknown   . Stroke Unknown   . Colon cancer Neg Hx     Social History   Socioeconomic History    . Marital status: Widowed    Spouse name: None  . Number of children: None  . Years of education: None  . Highest education level: None  Social Needs  . Financial resource strain: None  . Food insecurity - worry: None  . Food insecurity - inability: None  . Transportation needs - medical: None  . Transportation needs - non-medical: None  Occupational History  . Occupation: Full Time  Tobacco Use  . Smoking status: Never Smoker  . Smokeless tobacco: Never Used  Substance and Sexual Activity  . Alcohol use: No  . Drug use: No  . Sexual activity: No  Other Topics Concern  . None  Social History Narrative  . None    Review of Systems: General: Negative for anorexia, weight loss, fever, chills, fatigue, weakness. ENT: Negative for hoarseness, difficulty swallowing , nasal congestion. CV: Negative for chest pain, angina, palpitations, dyspnea on exertion, peripheral edema.  Respiratory: Negative for dyspnea at rest, dyspnea on exertion, cough, sputum, wheezing.  GI: See history of present illness. MS: Negative for joint pain, low back pain.  Derm: Negative for rash or itching.  Endo: Negative for unusual weight change.  Heme: Negative for bruising or bleeding. Allergy: Negative for rash or hives.   Physical Exam: BP (!) 147/87   Pulse 79   Temp (!) 96.9 F (36.1 C) (Oral)   Ht '5\' 9"'$  (1.753 m)   Wt 201 lb 6.4 oz (91.4 kg)   BMI 29.74 kg/m  General:   Alert and oriented. Pleasant and cooperative. Well-nourished and well-developed.  Eyes:  Without icterus, sclera clear and conjunctiva pink.  Ears:  Normal auditory acuity. Cardiovascular:  S1, S2 present without murmurs appreciated. Extremities without clubbing or edema. Respiratory:  Clear to auscultation bilaterally. No wheezes, rales, or rhonchi. No distress.  Gastrointestinal:  +BS, soft, non-tender and non-distended. No HSM noted. No guarding or rebound. No masses appreciated.  Rectal:  Deferred  Musculoskalatal:   Symmetrical without gross deformities. Neurologic:  Alert and oriented x4;  grossly normal neurologically. Psych:  Alert and cooperative. Normal mood and affect. Heme/Lymph/Immune: No excessive bruising noted.    03/02/2017 9:23 AM   Disclaimer: This note was dictated with voice recognition software. Similar sounding words can inadvertently be transcribed and may not be corrected upon review.

## 2017-03-02 NOTE — Assessment & Plan Note (Signed)
The patient has a history of rectal bleeding which has been scant and mostly toilet tissue hematochezia up until about 2 weeks ago.  Now he is having persistent, moderate to significant bleeding with bowel movements and without bowel movements.  At times it is just dripping from his rectum, per the patient.  His CBC was checked last week and persistent anemia with a hemoglobin in the low to mid 12 range.  Given his persistent bleeding on a daily basis I will recheck a stat CBC.  We will proceed with colonoscopy to further evaluate.  He does have a history of known hemorrhoids and this could be causing although his bleeding, per his description, seems to be a bit excessive for hemorrhoids especially since he is come off Xarelto.  I recommended he notify his cardiologist that he has stopped Xarelto temporarily and he agrees to do so.  Proceed with TCS with Dr. Gala Romney in near future: the risks, benefits, and alternatives have been discussed with the patient in detail. The patient states understanding and desires to proceed.  The patient does chronically take Xarelto although he is currently holding this.  No other anticoagulants, anxiolytics, chronic pain medications, or antidepressants.  Denies alcohol and drug use.  Conscious sedation should be adequate for his procedure.  Depending on the timeframe/scheduling of his colonoscopy we may need to reach out the cardiology to discuss holding Xarelto.  If it is within the next week or so we can likely have him continue to hold Xarelto as he has been.  We will recheck the cardiology notify them of this in case the patient does not.

## 2017-03-02 NOTE — Telephone Encounter (Signed)
Spoke with Randall Hiss. Pt will now be TCS W/ MAC. Spoke with pt and he is scheduled for 03/12/17 at 7:30am. Patient has pre-op scheduled for 03/05/17 at 8:00am. Patient aware prep sent into the pharmacy and I have also faxed patient prep instructions to the pharmacy. Instructions also mailed to patient.

## 2017-03-02 NOTE — Patient Instructions (Signed)
1. We will schedule your procedure for you. 2. Have your blood work done today. 3. Return for follow-up in 2 months. 4. Call us if you have any questions or concerns.

## 2017-03-02 NOTE — Progress Notes (Signed)
cc'ed to pcp °

## 2017-03-03 NOTE — Patient Instructions (Signed)
Brett Matthews  03/03/2017     @PREFPERIOPPHARMACY @   Your procedure is scheduled on  03/12/2017   Report to [facility] at  615   A.M.  Call this number if you have problems the morning of surgery:  [phone]   Remember:  Do not eat food or drink liquids after midnight.  Take these medicines the morning of surgery with A SIP OF WATER  Amlodipine, lotensin, coreg, levothyroxine, protonix, metoprolol, flomax.   Do not wear jewelry, make-up or nail polish.  Do not wear lotions, powders, or perfumes, or deodorant.  Do not shave 48 hours prior to surgery.  Men may shave face and neck.  Do not bring valuables to the hospital.  [facility] is not responsible for any belongings or valuables.  Contacts, dentures or bridgework may not be worn into surgery.  Leave your suitcase in the car.  After surgery it may be brought to your room.  For patients admitted to the hospital, discharge time will be determined by your treatment team.  Patients discharged the day of surgery will not be allowed to drive home.   Name and phone number of your driver:   family Special instructions:  Follow the diet and prep instructions given to you by Dr Roseanne Kaufman office.  Please read over the following fact sheets that you were given. Anesthesia Post-op Instructions and Care and Recovery After Surgery       Colonoscopy, Adult A colonoscopy is an exam to look at the large intestine. It is done to check for problems, such as:  Lumps (tumors).  Growths (polyps).  Swelling (inflammation).  Bleeding.  What happens before the procedure? Eating and drinking Follow instructions from your doctor about eating and drinking. These instructions may include:  A few days before the procedure - follow a low-fiber diet. ? Avoid nuts. ? Avoid seeds. ? Avoid dried fruit. ? Avoid raw fruits. ? Avoid vegetables.  1-3 days before the procedure - follow a clear liquid diet. Avoid liquids that  have red or purple dye. Drink only clear liquids, such as: ? Clear broth or bouillon. ? Black coffee or tea. ? Clear juice. ? Clear soft drinks or sports drinks. ? Gelatin dessert. ? Popsicles.  On the day of the procedure - do not eat or drink anything during the 2 hours before the procedure.  Bowel prep If you were prescribed an oral bowel prep:  Take it as told by your doctor. Starting the day before your procedure, you will need to drink a lot of liquid. The liquid will cause you to poop (have bowel movements) until your poop is almost clear or light green.  If your skin or butt gets irritated from diarrhea, you may: ? Wipe the area with wipes that have medicine in them, such as adult wet wipes with aloe and vitamin E. ? Put something on your skin that soothes the area, such as petroleum jelly.  If you throw up (vomit) while drinking the bowel prep, take a break for up to 60 minutes. Then begin the bowel prep again. If you keep throwing up and you cannot take the bowel prep without throwing up, call your doctor.  General instructions  Ask your doctor about changing or stopping your normal medicines. This is important if you take diabetes medicines or blood thinners.  Plan to have someone take you home from the hospital or clinic. What happens during the procedure?  An IV tube may be put into one of your veins.  You will be given medicine to help you relax (sedative).  To reduce your risk of infection: ? Your doctors will wash their hands. ? Your anal area will be washed with soap.  You will be asked to lie on your side with your knees bent.  Your doctor will get a long, thin, flexible tube ready. The tube will have a camera and a light on the end.  The tube will be put into your anus.  The tube will be gently put into your large intestine.  Air will be delivered into your large intestine to keep it open. You may feel some pressure or cramping.  The camera will be  used to take photos.  A small tissue sample may be removed from your body to be looked at under a microscope (biopsy). If any possible problems are found, the tissue will be sent to a lab for testing.  If small growths are found, your doctor may remove them and have them checked for cancer.  The tube that was put into your anus will be slowly removed. The procedure may vary among doctors and hospitals. What happens after the procedure?  Your doctor will check on you often until the medicines you were given have worn off.  Do not drive for 24 hours after the procedure.  You may have a small amount of blood in your poop.  You may pass gas.  You may have mild cramps or bloating in your belly (abdomen).  It is up to you to get the results of your procedure. Ask your doctor, or the department performing the procedure, when your results will be ready. This information is not intended to replace advice given to you by your health care provider. Make sure you discuss any questions you have with your health care provider. Document Released: 01/25/2010 Document Revised: 10/24/2015 Document Reviewed: 03/06/2015 Elsevier Interactive Patient Education  2017 Elsevier Inc.  Colonoscopy, Adult, Care After This sheet gives you information about how to care for yourself after your procedure. Your health care provider may also give you more specific instructions. If you have problems or questions, contact your health care provider. What can I expect after the procedure? After the procedure, it is common to have:  A small amount of blood in your stool for 24 hours after the procedure.  Some gas.  Mild abdominal cramping or bloating.  Follow these instructions at home: General instructions   For the first 24 hours after the procedure: ? Do not drive or use machinery. ? Do not sign important documents. ? Do not drink alcohol. ? Do your regular daily activities at a slower pace than  normal. ? Eat soft, easy-to-digest foods. ? Rest often.  Take over-the-counter or prescription medicines only as told by your health care provider.  It is up to you to get the results of your procedure. Ask your health care provider, or the department performing the procedure, when your results will be ready. Relieving cramping and bloating  Try walking around when you have cramps or feel bloated.  Apply heat to your abdomen as told by your health care provider. Use a heat source that your health care provider recommends, such as a moist heat pack or a heating pad. ? Place a towel between your skin and the heat source. ? Leave the heat on for 20-30 minutes. ? Remove the heat if your skin turns bright red. This is  especially important if you are unable to feel pain, heat, or cold. You may have a greater risk of getting burned. Eating and drinking  Drink enough fluid to keep your urine clear or pale yellow.  Resume your normal diet as instructed by your health care provider. Avoid heavy or fried foods that are hard to digest.  Avoid drinking alcohol for as long as instructed by your health care provider. Contact a health care provider if:  You have blood in your stool 2-3 days after the procedure. Get help right away if:  You have more than a small spotting of blood in your stool.  You pass large blood clots in your stool.  Your abdomen is swollen.  You have nausea or vomiting.  You have a fever.  You have increasing abdominal pain that is not relieved with medicine. This information is not intended to replace advice given to you by your health care provider. Make sure you discuss any questions you have with your health care provider. Document Released: 08/07/2003 Document Revised: 09/17/2015 Document Reviewed: 03/06/2015 Elsevier Interactive Patient Education  2018 [place] Anesthesia is a term that refers to techniques, procedures, and  medicines that help a person stay safe and comfortable during a medical procedure. Monitored anesthesia care, or sedation, is one type of anesthesia. Your anesthesia specialist may recommend sedation if you will be having a procedure that does not require you to be unconscious, such as:  Cataract surgery.  A dental procedure.  A biopsy.  A colonoscopy.  During the procedure, you may receive a medicine to help you relax (sedative). There are three levels of sedation:  Mild sedation. At this level, you may feel awake and relaxed. You will be able to follow directions.  Moderate sedation. At this level, you will be sleepy. You may not remember the procedure.  Deep sedation. At this level, you will be asleep. You will not remember the procedure.  The more medicine you are given, the deeper your level of sedation will be. Depending on how you respond to the procedure, the anesthesia specialist may change your level of sedation or the type of anesthesia to fit your needs. An anesthesia specialist will monitor you closely during the procedure. Let your health care provider know about:  Any allergies you have.  All medicines you are taking, including vitamins, herbs, eye drops, creams, and over-the-counter medicines.  Any use of steroids (by mouth or as a cream).  Any problems you or family members have had with sedatives and anesthetic medicines.  Any blood disorders you have.  Any surgeries you have had.  Any medical conditions you have, such as sleep apnea.  Whether you are pregnant or may be pregnant.  Any use of cigarettes, alcohol, or street drugs. What are the risks? Generally, this is a safe procedure. However, problems may occur, including:  Getting too much medicine (oversedation).  Nausea.  Allergic reaction to medicines.  Trouble breathing. If this happens, a breathing tube may be used to help with breathing. It will be removed when you are awake and breathing on  your own.  Heart trouble.  Lung trouble.  Before the procedure Staying hydrated Follow instructions from your health care provider about hydration, which may include:  Up to 2 hours before the procedure - you may continue to drink clear liquids, such as water, clear fruit juice, black coffee, and plain tea.  Eating and drinking restrictions Follow instructions from your health care provider  about eating and drinking, which may include:  8 hours before the procedure - stop eating heavy meals or foods such as meat, fried foods, or fatty foods.  6 hours before the procedure - stop eating light meals or foods, such as toast or cereal.  6 hours before the procedure - stop drinking milk or drinks that contain milk.  2 hours before the procedure - stop drinking clear liquids.  Medicines Ask your health care provider about:  Changing or stopping your regular medicines. This is especially important if you are taking diabetes medicines or blood thinners.  Taking medicines such as aspirin and ibuprofen. These medicines can thin your blood. Do not take these medicines before your procedure if your health care provider instructs you not to.  Tests and exams  You will have a physical exam.  You may have blood tests done to show: ? How well your kidneys and liver are working. ? How well your blood can clot.  General instructions  Plan to have someone take you home from the hospital or clinic.  If you will be going home right after the procedure, plan to have someone with you for 24 hours.  What happens during the procedure?  Your blood pressure, heart rate, breathing, level of pain and overall condition will be monitored.  An IV tube will be inserted into one of your veins.  Your anesthesia specialist will give you medicines as needed to keep you comfortable during the procedure. This may mean changing the level of sedation.  The procedure will be performed. After the  procedure  Your blood pressure, heart rate, breathing rate, and blood oxygen level will be monitored until the medicines you were given have worn off.  Do not drive for 24 hours if you received a sedative.  You may: ? Feel sleepy, clumsy, or nauseous. ? Feel forgetful about what happened after the procedure. ? Have a sore throat if you had a breathing tube during the procedure. ? Vomit. This information is not intended to replace advice given to you by your health care provider. Make sure you discuss any questions you have with your health care provider. Document Released: 09/18/2004 Document Revised: 06/01/2015 Document Reviewed: 04/15/2015 Elsevier Interactive Patient Education  2018 [place], Care After These instructions provide you with information about caring for yourself after your procedure. Your health care provider may also give you more specific instructions. Your treatment has been planned according to current medical practices, but problems sometimes occur. Call your health care provider if you have any problems or questions after your procedure. What can I expect after the procedure? After your procedure, it is common to:  Feel sleepy for several hours.  Feel clumsy and have poor balance for several hours.  Feel forgetful about what happened after the procedure.  Have poor judgment for several hours.  Feel nauseous or vomit.  Have a sore throat if you had a breathing tube during the procedure.  Follow these instructions at home: For at least 24 hours after the procedure:   Do not: ? Participate in activities in which you could fall or become injured. ? Drive. ? Use heavy machinery. ? Drink alcohol. ? Take sleeping pills or medicines that cause drowsiness. ? Make important decisions or sign legal documents. ? Take care of children on your own.  Rest. Eating and drinking  Follow the diet that is recommended by your health  care provider.  If you vomit, drink water, juice,  or soup when you can drink without vomiting.  Make sure you have little or no nausea before eating solid foods. General instructions  Have a responsible adult stay with you until you are awake and alert.  Take over-the-counter and prescription medicines only as told by your health care provider.  If you smoke, do not smoke without supervision.  Keep all follow-up visits as told by your health care provider. This is important. Contact a health care provider if:  You keep feeling nauseous or you keep vomiting.  You feel light-headed.  You develop a rash.  You have a fever. Get help right away if:  You have trouble breathing. This information is not intended to replace advice given to you by your health care provider. Make sure you discuss any questions you have with your health care provider. Document Released: 04/15/2015 Document Revised: 08/15/2015 Document Reviewed: 04/15/2015 Elsevier Interactive Patient Education  [employer].

## 2017-03-05 ENCOUNTER — Other Ambulatory Visit: Payer: Self-pay

## 2017-03-05 ENCOUNTER — Encounter (HOSPITAL_COMMUNITY): Payer: Self-pay

## 2017-03-05 ENCOUNTER — Encounter (HOSPITAL_COMMUNITY)
Admission: RE | Admit: 2017-03-05 | Discharge: 2017-03-05 | Disposition: A | Discharge status: Home / Self Care | Payer: PPO | Source: Ambulatory Visit | Attending: Internal Medicine | Admitting: Internal Medicine

## 2017-03-05 DIAGNOSIS — R9431 Abnormal electrocardiogram [ECG] [EKG]: Secondary | ICD-10-CM | POA: Insufficient documentation

## 2017-03-05 DIAGNOSIS — Z01810 Encounter for preprocedural cardiovascular examination: Secondary | ICD-10-CM | POA: Insufficient documentation

## 2017-03-09 NOTE — Progress Notes (Signed)
Cardiology Office Note  Date: 03/10/2017   ID: Brett Matthews, DOB 1932/11/08, MRN [id number]  PCP: Glenda Chroman, MD  Primary Cardiologist: Rozann Lesches, MD   Chief Complaint  Patient presents with  . Coronary Artery Disease  . PAF    History of Present Illness: Brett Matthews is an 84 y.o. male last seen in January 2018.  He presents overdue for a follow-up visit.  He is not reporting any angina symptoms at this time, no significant palpitations.  I did review his interval records, he has had recent problems with recurring hematochezia, has been off Xarelto in anticipation of a colonoscopy this week with Dr. Gala Romney.  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  Plans to do this as long as he can.  I reviewed his medications.  Current cardiac regimen includes Norvasc, Lipitor, Coreg, and Lotensin.  I personally reviewed his recent ECG from 03/05/2017 which showed sinus rhythm with left anterior fascicular block.  Past Medical History:  Diagnosis Date  . Anemia, iron deficiency    Negative capsule endoscopy  . Arthritis   . Coronary atherosclerosis of native coronary artery    60 % circumflex stenosis; EF 65%, Cath 6/13  . Diverticula of colon    Pancolonic  . DM (diabetes mellitus), type 2, uncontrolled ([place])   . Dyspnea    Normal cardiopulmonary function test in July 2008, negative echocardiogram with "bubble" study for inter-cardiac shunt.  . Essential hypertension, benign   . Gastroesophageal reflux disease   . Hemorrhoids   . Hyperplastic colon polyp 04/06/2007  . Hypothyroidism 2003   Status post total thyroidectomy  . Migraines   . Neoplasm of lymphatic and hematopoietic tissue   . Paroxysmal atrial fibrillation ([place])    Diagnosed 2005  . Skin cancer     Past Surgical History:  Procedure Laterality Date  . BACK SURGERY    . BONE MARROW BIOPSY  1990's  . COLONOSCOPY  04/06/2007   Dr. Gala Romney- Normal rectum with scattered  pancolonic diverticula and slightly redundant elongated colon, diminutive polpy midsigmoid, remainder of colonic mucosa appeared normal. bx= hyperplastic polyp  . COLONOSCOPY N/A 11/29/2012   TGP:[id number] preparation. Friable anal canal/internal hemorrhoids; otherwise, normal rectum. Normal-appearing colonic mucosa  . ESOPHAGOGASTRODUODENOSCOPY  04/06/2007   Dr. Gala Romney- normal esophagus s/p nissen fundoplication, intact nissen wrap o/w normal stomach  . ESOPHAGOGASTRODUODENOSCOPY N/A 11/29/2012   RMR: Abnormall distal esophagus bx c/w GERD. prior fundoplication. Gastric polyps  -bx benign. Status post biopsy of normal--appearing duodenal mucosa (bx neg)  . LEFT HEART CATHETERIZATION WITH CORONARY ANGIOGRAM N/A 06/27/2011   Procedure: LEFT HEART CATHETERIZATION WITH CORONARY ANGIOGRAM;  Surgeon: Thayer Headings, MD;  Location: [facility] CATH LAB;  Service: Cardiovascular;  Laterality: N/A;  . NISSEN FUNDOPLICATION    . POSTERIOR LAMINECTOMY / DECOMPRESSION LUMBAR SPINE  ~ 1990's  . SKIN CANCER EXCISION     "off my back and chest; from sun"    Current Outpatient Medications  Medication Sig Dispense Refill  . amLODipine (NORVASC) 10 MG tablet Take 10 mg by mouth daily.      Marland Kitchen atorvastatin (LIPITOR) 40 MG tablet Take 40 mg by mouth daily.    . Azelastine HCl 0.15 % SOLN Place 1 spray into the nose as needed.    . benazepril (LOTENSIN) 5 MG tablet Take 5 mg by mouth daily.    . carvedilol (COREG) 12.5 MG tablet carvedilol 12.5 mg tablet daily    .  diclofenac (VOLTAREN) 75 MG EC tablet Take 1 tablet by mouth daily.     Marland Kitchen DYMISTA 137-50 MCG/ACT SUSP Place 1 spray into the nose daily.     Marland Kitchen levothyroxine (SYNTHROID, LEVOTHROID) 125 MCG tablet Take 125 mcg by mouth daily before breakfast.    . metFORMIN (GLUCOPHAGE) 500 MG tablet Take 1 tablet (500 mg total) by mouth 2 (two) times daily with a meal. HOLD 48 hours, restart on 06/30/2011.    . metoprolol tartrate (LOPRESSOR) 25 MG tablet take 1 tablet by mouth  twice a day 180 tablet 3  . pantoprazole (PROTONIX) 40 MG tablet Take 40 mg by mouth daily.      . potassium chloride (MICRO-K) 10 MEQ CR capsule Take 1 capsule by mouth daily.    . tamsulosin (FLOMAX) 0.4 MG CAPS capsule Take 0.4 mg by mouth daily after supper.      No current facility-administered medications for this visit.    Allergies:  Patient has no known allergies.   Social History: The patient  reports that  has never smoked. he has never used smokeless tobacco. He reports that he does not drink alcohol or use drugs.   ROS:  Please see the history of present illness. Otherwise, complete review of systems is positive for hearing loss, arthritic stiffness.  All other systems are reviewed and negative.   Physical Exam: VS:  BP 136/74   Pulse 74   Ht _0  (1.778 m)   Wt 204 lb (92.5 kg)   SpO2 96%   BMI 29.27 kg/m , BMI Body mass index is 29.27 kg/m.  Wt Readings from Last 3 Encounters:  03/10/17 204 lb (92.5 kg)  03/05/17 202 lb (91.6 kg)  03/02/17 201 lb 6.4 oz (91.4 kg)    General: Patient appears comfortable at rest. HEENT: Conjunctiva and lids normal, oropharynx clear with moist mucosa. Neck: Supple, no elevated JVP or carotid bruits, no thyromegaly. Lungs: Clear to auscultation, nonlabored breathing at rest. Cardiac: Regular rate and rhythm, no S3 or significant systolic murmur. Abdomen: Soft, nontender, bowel sounds present. Extremities: No pitting edema, distal pulses 2+. Skin: Warm and dry. Musculoskeletal: No kyphosis. Neuropsychiatric: Alert and oriented x3, affect grossly appropriate.  ECG: I personally reviewed the tracing from 01/25/2016 which showed sinus rhythm with PACs and decreased R wave progression.  Recent Labwork: 02/26/2017: BUN 11; Creatinine, Ser 0.84; Potassium 3.8; Sodium 135 03/02/2017: Hemoglobin 12.4; Platelets 303     Component Value Date/Time   CHOL 184 06/27/2011 0605   TRIG 186 (H) 06/27/2011 0605   HDL 44 06/27/2011 0605    CHOLHDL 4.2 06/27/2011 0605   VLDL 37 06/27/2011 0605   LDLCALC 103 (H) 06/27/2011 0605    Other Studies Reviewed Today:  Echocardiogram from [facility] on 12/20/2012 reported mild LVH with LVEF 16-10%, diastolic dysfunction, mild left atrial enlargement, upper normal RV chamber size, MAC with mild mitral regurgitation, sclerotic aortic valve with trace aortic regurgitation, mild tricuspid regurgitation with RVSP 25-35 mmHg.  Assessment and Plan:  1.  Paroxysmal atrial fibrillation, doing well without reported palpitations or obvious breakthrough events.  I reviewed his recent ECG.  He is currently off Xarelto with recent hematochezia and pending colonoscopy.  Otherwise continue on beta-blocker.  2.  Moderate CAD involving the circumflex without active angina.  Continue medical therapy which includes statin.  3.  Essential hypertension, blood pressure control is reasonable today.  No changes made.  Keep follow-up with Dr. Woody Seller.  4.  Mixed hyperlipidemia, continues  on Lipitor.  Current medicines were reviewed with the patient today.  Disposition: Follow-up in 6 months.  Signed, Satira Sark, MD, [facility] 03/10/2017 2:03 PM    [facility] [address], [place], [place] [address] Phone: [phone]; Fax: [phone]

## 2017-03-10 ENCOUNTER — Ambulatory Visit (INDEPENDENT_AMBULATORY_CARE_PROVIDER_SITE_OTHER): Payer: PPO | Admitting: Cardiology

## 2017-03-10 ENCOUNTER — Encounter: Payer: Self-pay | Admitting: Cardiology

## 2017-03-10 VITALS — BP 136/74 | HR 74 | Ht 70.0 in | Wt 204.0 lb

## 2017-03-10 DIAGNOSIS — I10 Essential (primary) hypertension: Secondary | ICD-10-CM

## 2017-03-10 DIAGNOSIS — E782 Mixed hyperlipidemia: Secondary | ICD-10-CM | POA: Diagnosis not present

## 2017-03-10 DIAGNOSIS — I480 Paroxysmal atrial fibrillation: Secondary | ICD-10-CM | POA: Diagnosis not present

## 2017-03-10 DIAGNOSIS — I2510 Atherosclerotic heart disease of native coronary artery without angina pectoris: Secondary | ICD-10-CM | POA: Diagnosis not present

## 2017-03-10 NOTE — Patient Instructions (Signed)

## 2017-03-12 ENCOUNTER — Telehealth: Payer: Self-pay | Admitting: Internal Medicine

## 2017-03-12 ENCOUNTER — Ambulatory Visit (HOSPITAL_COMMUNITY)
Admission: RE | Admit: 2017-03-12 | Discharge: 2017-03-12 | Disposition: A | Discharge status: Home / Self Care | Payer: PPO | Source: Ambulatory Visit | Attending: Internal Medicine | Admitting: Internal Medicine

## 2017-03-12 ENCOUNTER — Ambulatory Visit (HOSPITAL_COMMUNITY): Payer: PPO | Admitting: Anesthesiology

## 2017-03-12 ENCOUNTER — Encounter (HOSPITAL_COMMUNITY): Payer: Self-pay | Admitting: *Deleted

## 2017-03-12 ENCOUNTER — Other Ambulatory Visit: Payer: Self-pay

## 2017-03-12 ENCOUNTER — Telehealth: Payer: Self-pay

## 2017-03-12 ENCOUNTER — Encounter (HOSPITAL_COMMUNITY)
Admission: RE | Disposition: A | Discharge status: Home / Self Care | Payer: Self-pay | Source: Ambulatory Visit | Attending: Internal Medicine

## 2017-03-12 DIAGNOSIS — R1030 Lower abdominal pain, unspecified: Secondary | ICD-10-CM

## 2017-03-12 DIAGNOSIS — D509 Iron deficiency anemia, unspecified: Secondary | ICD-10-CM | POA: Insufficient documentation

## 2017-03-12 DIAGNOSIS — Z85828 Personal history of other malignant neoplasm of skin: Secondary | ICD-10-CM | POA: Diagnosis not present

## 2017-03-12 DIAGNOSIS — R109 Unspecified abdominal pain: Secondary | ICD-10-CM | POA: Insufficient documentation

## 2017-03-12 DIAGNOSIS — K625 Hemorrhage of anus and rectum: Secondary | ICD-10-CM | POA: Diagnosis not present

## 2017-03-12 DIAGNOSIS — K5730 Diverticulosis of large intestine without perforation or abscess without bleeding: Secondary | ICD-10-CM | POA: Diagnosis not present

## 2017-03-12 DIAGNOSIS — Z86010 Personal history of colonic polyps: Secondary | ICD-10-CM | POA: Insufficient documentation

## 2017-03-12 DIAGNOSIS — Z791 Long term (current) use of non-steroidal anti-inflammatories (NSAID): Secondary | ICD-10-CM | POA: Diagnosis not present

## 2017-03-12 DIAGNOSIS — K5900 Constipation, unspecified: Secondary | ICD-10-CM | POA: Insufficient documentation

## 2017-03-12 DIAGNOSIS — E119 Type 2 diabetes mellitus without complications: Secondary | ICD-10-CM | POA: Diagnosis not present

## 2017-03-12 DIAGNOSIS — D120 Benign neoplasm of cecum: Secondary | ICD-10-CM | POA: Insufficient documentation

## 2017-03-12 DIAGNOSIS — K219 Gastro-esophageal reflux disease without esophagitis: Secondary | ICD-10-CM | POA: Diagnosis not present

## 2017-03-12 DIAGNOSIS — Z7984 Long term (current) use of oral hypoglycemic drugs: Secondary | ICD-10-CM | POA: Diagnosis not present

## 2017-03-12 DIAGNOSIS — Z79899 Other long term (current) drug therapy: Secondary | ICD-10-CM | POA: Insufficient documentation

## 2017-03-12 DIAGNOSIS — I10 Essential (primary) hypertension: Secondary | ICD-10-CM | POA: Insufficient documentation

## 2017-03-12 DIAGNOSIS — K643 Fourth degree hemorrhoids: Secondary | ICD-10-CM | POA: Insufficient documentation

## 2017-03-12 DIAGNOSIS — K644 Residual hemorrhoidal skin tags: Secondary | ICD-10-CM | POA: Diagnosis not present

## 2017-03-12 DIAGNOSIS — K649 Unspecified hemorrhoids: Secondary | ICD-10-CM

## 2017-03-12 DIAGNOSIS — I2510 Atherosclerotic heart disease of native coronary artery without angina pectoris: Secondary | ICD-10-CM | POA: Insufficient documentation

## 2017-03-12 DIAGNOSIS — E890 Postprocedural hypothyroidism: Secondary | ICD-10-CM | POA: Diagnosis not present

## 2017-03-12 DIAGNOSIS — D649 Anemia, unspecified: Secondary | ICD-10-CM | POA: Diagnosis not present

## 2017-03-12 DIAGNOSIS — Z79890 Hormone replacement therapy (postmenopausal): Secondary | ICD-10-CM | POA: Insufficient documentation

## 2017-03-12 DIAGNOSIS — K921 Melena: Secondary | ICD-10-CM | POA: Diagnosis not present

## 2017-03-12 HISTORY — PX: POLYPECTOMY: SHX5525

## 2017-03-12 HISTORY — PX: COLONOSCOPY WITH PROPOFOL: SHX5780

## 2017-03-12 LAB — GLUCOSE, CAPILLARY
GLUCOSE-CAPILLARY: 98 mg/dL (ref 65–99)
Glucose-Capillary: 99 mg/dL (ref 65–99)

## 2017-03-12 SURGERY — COLONOSCOPY WITH PROPOFOL
Anesthesia: Monitor Anesthesia Care

## 2017-03-12 MED ORDER — LIDOCAINE HCL (PF) 1 % IJ SOLN
INTRAMUSCULAR | Status: AC
  Filled 2017-03-12: qty 5

## 2017-03-12 MED ORDER — CHLORHEXIDINE GLUCONATE CLOTH 2 % EX PADS: 6.0000 | MEDICATED_PAD | Freq: Once | CUTANEOUS | Status: DC

## 2017-03-12 MED ORDER — PROPOFOL 10 MG/ML IV BOLUS
INTRAVENOUS | Status: AC
  Filled 2017-03-12: qty 60

## 2017-03-12 MED ORDER — MIDAZOLAM HCL 2 MG/2ML IJ SOLN
INTRAMUSCULAR | Status: AC
  Filled 2017-03-12: qty 2

## 2017-03-12 MED ORDER — FENTANYL CITRATE (PF) 100 MCG/2ML IJ SOLN
INTRAMUSCULAR | Status: AC
  Filled 2017-03-12: qty 2

## 2017-03-12 MED ORDER — PROPOFOL 500 MG/50ML IV EMUL
INTRAVENOUS | Status: DC | PRN
  Administered 2017-03-12: 08:00:00 via INTRAVENOUS
  Administered 2017-03-12: 150 ug/kg/min via INTRAVENOUS

## 2017-03-12 MED ORDER — LACTATED RINGERS IV SOLN
INTRAVENOUS | Status: DC
  Administered 2017-03-12: 1000 mL via INTRAVENOUS

## 2017-03-12 MED ORDER — MIDAZOLAM HCL 2 MG/2ML IJ SOLN
1.0000 mg | INTRAMUSCULAR | Status: AC
  Administered 2017-03-12: 2 mg via INTRAVENOUS

## 2017-03-12 MED ORDER — FENTANYL CITRATE (PF) 100 MCG/2ML IJ SOLN
25.0000 ug | Freq: Once | INTRAMUSCULAR | Status: AC
  Administered 2017-03-12: 25 ug via INTRAVENOUS

## 2017-03-12 NOTE — Telephone Encounter (Signed)
See previous phone note. Appt scheduled with Dr. Arnoldo Morale.

## 2017-03-12 NOTE — Progress Notes (Signed)
Telephone voice mail message for Brett Matthews at Dr. Roseanne Kaufman office that patient has been scheduled with Dr. Arnoldo Morale for follow up of hemorrhoids next Tuesday March 17, 2017.  Message left for Brett Matthews to call Dr. Arnoldo Morale office to complete information for referral process.

## 2017-03-12 NOTE — Telephone Encounter (Signed)
Brett Matthews at [place] called office, LMOVM. Dr. Gala Romney wanted pt to see surgeon (Dr. Arnoldo Morale) for grade 3 & 4 hemorrhoids. Brett Matthews scheduled appt with Dr. Arnoldo Morale 03/17/17 at 10:15am. Referral sent via Epic.

## 2017-03-12 NOTE — Op Note (Signed)
[facility] Patient Name: Brett Matthews Procedure Date: 03/12/2017 7:05 AM MRN: [id number] Date of Birth: 1932/06/27 Attending MD: Norvel Richards , MD CSN: [id number] Age: 84 Admit Type: Outpatient Procedure:                Colonoscopy Indications:              Hematochezia Providers:                Norvel Richards, MD, Lurline Del, RN, Aram Candela Referring MD:             Glenda Chroman Medicines:                Propofol per Anesthesia Complications:            No immediate complications. Estimated Blood Loss:     Estimated blood loss was minimal. Procedure:                Pre-Anesthesia Assessment:                           - Prior to the procedure, a History and Physical                            was performed, and patient medications and                            allergies were reviewed. The patient's tolerance of                            previous anesthesia was also reviewed. The risks                            and benefits of the procedure and the sedation                            options and risks were discussed with the patient.                            All questions were answered, and informed consent                            was obtained. Prior Anticoagulants: The patient                            last took Xarelto (rivaroxaban) 7 days prior to the                            procedure. ASA Grade Assessment: III - A patient                            with severe systemic disease. After reviewing the  risks and benefits, the patient was deemed in                            satisfactory condition to undergo the procedure.                           After obtaining informed consent, the colonoscope                            was passed under direct vision. Throughout the                            procedure, the patient's blood pressure, pulse, and                            oxygen saturations were  monitored continuously. The                            EC-[id number] ([id number]) scope was introduced through                            the and advanced to the the cecum, identified by                            appendiceal orifice and ileocecal valve. The                            colonoscopy was performed without difficulty. The                            patient tolerated the procedure well. The quality                            of the bowel preparation was adequate. The                            ileocecal valve, appendiceal orifice, and rectum                            were photographed. The colonoscopy was performed                            without difficulty. The patient tolerated the                            procedure well. Scope In: 7:39:21 AM Scope Out: 8:02:51 AM Scope Withdrawal Time: 0 hours 16 minutes 56 seconds  Total Procedure Duration: 0 hours 23 minutes 30 seconds  Findings:      The perianal exam was abnormal.      Non-bleeding external and internal hemorrhoids were found. The       hemorrhoids were Grade III (internal hemorrhoids that prolapse but       require manual reduction) and Grade IV (internal hemorrhoids that       prolapse and cannot be reduced manually).  Scattered medium-mouthed diverticula were found in the entire colon.      Three sessile polyps were found in the cecum. The polyps were 3 to 6 mm       in size. These polyps were removed with a cold snare. Resection and       retrieval were complete. Estimated blood loss was minimal.      The exam was otherwise without abnormality on direct and retroflexion       views. Impression:               - Abnormal perianal exam.                           - Prominent External and internal hemorrhoids -                            Likely source of hematochezia.                           - Diverticulosis in the entire examined colon.                           - Three 3 to 6 mm polyps in the cecum, removed with                             a cold snare. Resected and retrieved.                           - The examination was otherwise normal on direct                            and retroflexion views. Moderate Sedation:      Moderate (conscious) sedation was administered by the endoscopy nurse       and supervised by the endoscopist. The following parameters were       monitored: oxygen saturation, heart rate, blood pressure, respiratory       rate, EKG, adequacy of pulmonary ventilation, and response to care.       Total physician intraservice time was 28 minutes.      Moderate (conscious) sedation was personally administered by an       anesthesia professional. The following parameters were monitored: oxygen       saturation, heart rate, blood pressure, respiratory rate, EKG, adequacy       of pulmonary ventilation, and response to care. Total physician       intraservice time was 28 minutes. Recommendation:           - Patient has a contact number available for                            emergencies. The signs and symptoms of potential                            delayed complications were discussed with the                            patient. Return to normal activities tomorrow.  Written discharge instructions were provided to the                            patient.                           - Resume previous diet.                           - Continue present medications.                           - Repeat colonoscopy date to be determined after                            pending pathology results are reviewed for                            surveillance based on pathology results.                           - Refer to a surgeon at the next available                            appointment. Patient needs definitive management of                            hemorrhoids. Surgical hemorrhoidectomy This will                            facilitate limiting the time patient is  without                            anticoagulation. Will refer to Dr. Arnoldo Morale. Procedure Code(s):        --- Professional ---                           [phone], Moderate sedation services provided by the                            same physician or other qualified health care                            professional performing the diagnostic or                            therapeutic service that the sedation supports,                            requiring the presence of an independent trained                            observer to assist in the monitoring of the  patient's level of consciousness and physiological                            status; initial 15 minutes of intraservice time,                            patient age 58 years or older                           [phone], Moderate sedation services provided by the                            same physician or other qualified health care                            professional performing the diagnostic or                            therapeutic service that the sedation supports,                            requiring the presence of an independent trained                            observer to assist in the monitoring of the                            patient's level of consciousness and physiological                            status; initial 15 minutes of intraservice time,                            patient age 61 years or older                           [phone], Moderate sedation services; each additional                            15 minutes intraservice time                           [id number], Moderate sedation services; each additional                            15 minutes intraservice time Diagnosis Code(s):        --- Professional ---                           K64.3, Fourth degree hemorrhoids                           D12.0, Benign neoplasm of cecum                           K92.1,  Melena (includes Hematochezia)                            K57.30, Diverticulosis of large intestine without                            perforation or abscess without bleeding CPT copyright 2016 [employer]. All rights reserved. The codes documented in this report are preliminary and upon coder review may  be revised to meet current compliance requirements. Cristopher Estimable. Rourk, MD Norvel Richards, MD 03/12/2017 8:18:16 AM This report has been signed electronically. Number of Addenda: 0

## 2017-03-12 NOTE — Interval H&P Note (Signed)
History and Physical Interval Note:  03/12/2017 7:26 AM  Brett Matthews  has presented today for surgery, with the diagnosis of rectal bleeding, anemia, abdominal pain  The various methods of treatment have been discussed with the patient and family. After consideration of risks, benefits and other options for treatment, the patient has consented to  Procedure(s) with comments: COLONOSCOPY WITH PROPOFOL (N/A) - 7:30am as a surgical intervention .  The patient's history has been reviewed, patient examined, no change in status, stable for surgery.  I have reviewed the patient's chart and labs.  Questions were answered to the patient's satisfaction.      No change. Patient remains off Xarelto  Hemoglobin remains in the 12 range. Diagnostic colonoscopy today for plan.  The risks, benefits, limitations, alternatives and imponderables have been reviewed with the patient. Questions have been answered. All parties are agreeable.   Manus Rudd

## 2017-03-12 NOTE — Transfer of Care (Signed)
Immediate Anesthesia Transfer of Care Note  Patient: Brett Matthews  Procedure(s) Performed: COLONOSCOPY WITH PROPOFOL (N/A ) POLYPECTOMY  Patient Location: PACU  Anesthesia Type:MAC  Level of Consciousness: awake and alert   Airway & Oxygen Therapy: Patient Spontanous Breathing  Post-op Assessment: Report given to RN  Post vital signs: Reviewed and stable  Last Vitals:  Vitals:   03/12/17 0725 03/12/17 0810  BP: 124/78 99/70  Pulse:  66  Resp: 15 15  Temp:  36.6 C  SpO2: 99% 99%    Last Pain:  Vitals:   03/12/17 0649  TempSrc: Oral         Complications: No apparent anesthesia complications

## 2017-03-12 NOTE — Telephone Encounter (Signed)
Per RMR OP note recommendations on 03/12/17 ---Refer to a surgeon at the next available appointment. Patient needs definitive management of hemorrhoids. Surgical hemorrhoidectomy This will facilitate limiting the time patient is without anticoagulation. Will refer to Dr. Arnoldo Morale.

## 2017-03-12 NOTE — Anesthesia Postprocedure Evaluation (Signed)
Anesthesia Post Note  Patient: Brett Matthews  Procedure(s) Performed: COLONOSCOPY WITH PROPOFOL (N/A ) POLYPECTOMY  Patient location during evaluation: PACU Anesthesia Type: MAC Level of consciousness: awake and alert and patient uncooperative Pain management: satisfactory to patient Vital Signs Assessment: post-procedure vital signs reviewed and stable Respiratory status: spontaneous breathing Cardiovascular status: stable Postop Assessment: no apparent nausea or vomiting Anesthetic complications: no     Last Vitals:  Vitals:   03/12/17 0830 03/12/17 0835  BP: 127/77 131/83  Pulse: 71 69  Resp: 18 18  Temp:  36.5 C  SpO2: 97% 97%    Last Pain:  Vitals:   03/12/17 0835  TempSrc: Oral                 Sula Fetterly

## 2017-03-12 NOTE — Anesthesia Preprocedure Evaluation (Signed)
Anesthesia Evaluation  Patient identified by MRN, date of birth, ID band Patient awake    Reviewed: Allergy & Precautions, NPO status , Patient's Chart, lab work & pertinent test results  Airway Mallampati: II  TM Distance: >3 FB Neck ROM: Full    Dental  (+) Teeth Intact, Implants   Pulmonary shortness of breath and with exertion,    breath sounds clear to auscultation       Cardiovascular hypertension, Pt. on medications and Pt. on home beta blockers + CAD and + DOE  + dysrhythmias Atrial Fibrillation  Rhythm:Regular Rate:Normal     Neuro/Psych  Headaches,    GI/Hepatic GERD  Controlled and Medicated,  Endo/Other  diabetes, Type 2Hypothyroidism   Renal/GU      Musculoskeletal   Abdominal   Peds  Hematology  (+) anemia ,   Anesthesia Other Findings   Reproductive/Obstetrics                             Anesthesia Physical Anesthesia Plan  ASA: III  Anesthesia Plan: MAC   Post-op Pain Management:    Induction: Intravenous  PONV Risk Score and Plan:   Airway Management Planned: Simple Face Mask  Additional Equipment:   Intra-op Plan:   Post-operative Plan:   Informed Consent: I have reviewed the patients History and Physical, chart, labs and discussed the procedure including the risks, benefits and alternatives for the proposed anesthesia with the patient or authorized representative who has indicated his/her understanding and acceptance.     Plan Discussed with:   Anesthesia Plan Comments:         Anesthesia Quick Evaluation

## 2017-03-12 NOTE — Discharge Instructions (Signed)
Colonoscopy Discharge Instructions  Read the instructions outlined below and refer to this sheet in the next few weeks. These discharge instructions provide you with general information on caring for yourself after you leave the hospital. Your doctor may also give you specific instructions. While your treatment has been planned according to the most current medical practices available, unavoidable complications occasionally occur. If you have any problems or questions after discharge, call Dr. Gala Romney at [phone]. ACTIVITY  You may resume your regular activity, but move at a slower pace for the next 24 hours.   Take frequent rest periods for the next 24 hours.   Walking will help get rid of the air and reduce the bloated feeling in your belly (abdomen).   No driving for 24 hours (because of the medicine (anesthesia) used during the test).    Do not sign any important legal documents or operate any machinery for 24 hours (because of the anesthesia used during the test).  NUTRITION  Drink plenty of fluids.   You may resume your normal diet as instructed by your doctor.   Begin with a light meal and progress to your normal diet. Heavy or fried foods are harder to digest and may make you feel sick to your stomach (nauseated).   Avoid alcoholic beverages for 24 hours or as instructed.  MEDICATIONS  You may resume your normal medications unless your doctor tells you otherwise.  WHAT YOU CAN EXPECT TODAY  Some feelings of bloating in the abdomen.   Passage of more gas than usual.   Spotting of blood in your stool or on the toilet paper.  IF YOU HAD POLYPS REMOVED DURING THE COLONOSCOPY:  No aspirin products for 7 days or as instructed.   No alcohol for 7 days or as instructed.   Eat a soft diet for the next 24 hours.  FINDING OUT THE RESULTS OF YOUR TEST Not all test results are available during your visit. If your test results are not back during the visit, make an appointment  with your caregiver to find out the results. Do not assume everything is normal if you have not heard from your caregiver or the medical facility. It is important for you to follow up on all of your test results.  SEEK IMMEDIATE MEDICAL ATTENTION IF:  You have more than a spotting of blood in your stool.   Your belly is swollen (abdominal distention).   You are nauseated or vomiting.   You have a temperature over 101.   You have abdominal pain or discomfort that is severe or gets worse throughout the day.     Hemorrhoid, diverticulosis and colon polyp information provided  You are bleeding from hemorrhoids. You would benefit from a hemorrhoidectomy.  I am going to refer you to Dr. Aviva Signs here in [place].    I will let you know about the polyps are removed today once the report is back for review.   You may restart your Xarelto today.      Diverticulosis Diverticulosis is a condition that develops when small pouches (diverticula) form in the wall of the large intestine (colon). The colon is where water is absorbed and stool is formed. The pouches form when the inside layer of the colon pushes through weak spots in the outer layers of the colon. You may have a few pouches or many of them. What are the causes? The cause of this condition is not known. What increases the risk? The following factors may  make you more likely to develop this condition:  Being older than age 52. Your risk for this condition increases with age. Diverticulosis is rare among people younger than age 37. By age 4, many people have it.  Eating a low-fiber diet.  Having frequent constipation.  Being overweight.  Not getting enough exercise.  Smoking.  Taking over-the-counter pain medicines, like aspirin and ibuprofen.  Having a family history of diverticulosis.  What are the signs or symptoms? In most people, there are no symptoms of this condition. If you do have symptoms, they may  include:  Bloating.  Cramps in the abdomen.  Constipation or diarrhea.  Pain in the lower left side of the abdomen.  How is this diagnosed? This condition is most often diagnosed during an exam for other colon problems. Because diverticulosis usually has no symptoms, it often cannot be diagnosed independently. This condition may be diagnosed by:  Using a flexible scope to examine the colon (colonoscopy).  Taking an X-ray of the colon after dye has been put into the colon (barium enema).  Doing a CT scan.  How is this treated? You may not need treatment for this condition if you have never developed an infection related to diverticulosis. If you have had an infection before, treatment may include:  Eating a high-fiber diet. This may include eating more fruits, vegetables, and grains.  Taking a fiber supplement.  Taking a live bacteria supplement (probiotic).  Taking medicine to relax your colon.  Taking antibiotic medicines.  Follow these instructions at home:  Drink 6-8 glasses of water or more each day to prevent constipation.  Try not to strain when you have a bowel movement.  If you have had an infection before: ? Eat more fiber as directed by your health care provider or your diet and nutrition specialist (dietitian). ? Take a fiber supplement or probiotic, if your health care provider approves.  Take over-the-counter and prescription medicines only as told by your health care provider.  If you were prescribed an antibiotic, take it as told by your health care provider. Do not stop taking the antibiotic even if you start to feel better.  Keep all follow-up visits as told by your health care provider. This is important. Contact a health care provider if:  You have pain in your abdomen.  You have bloating.  You have cramps.  You have not had a bowel movement in 3 days. Get help right away if:  Your pain gets worse.  Your bloating becomes very bad.  You  have a fever or chills, and your symptoms suddenly get worse.  You vomit.  You have bowel movements that are bloody or black.  You have bleeding from your rectum. Summary  Diverticulosis is a condition that develops when small pouches (diverticula) form in the wall of the large intestine (colon).  You may have a few pouches or many of them.  This condition is most often diagnosed during an exam for other colon problems.  If you have had an infection related to diverticulosis, treatment may include increasing the fiber in your diet, taking supplements, or taking medicines. This information is not intended to replace advice given to you by your health care provider. Make sure you discuss any questions you have with your health care provider. Document Released: 09/20/2003 Document Revised: 11/12/2015 Document Reviewed: 11/12/2015 Elsevier Interactive Patient Education  2017 [place].    Colon Polyps Polyps are tissue growths inside the body. Polyps can grow in many  places, including the large intestine (colon). A polyp may be a round bump or a mushroom-shaped growth. You could have one polyp or several. Most colon polyps are noncancerous (benign). However, some colon polyps can become cancerous over time. What are the causes? The exact cause of colon polyps is not known. What increases the risk? This condition is more likely to develop in people who:  Have a family history of colon cancer or colon polyps.  Are older than 21 or older than 45 if they are African American.  Have inflammatory bowel disease, such as ulcerative colitis or Crohn disease.  Are overweight.  Smoke cigarettes.  Do not get enough exercise.  Drink too much alcohol.  Eat a diet that is: ? High in fat and red meat. ? Low in fiber.  Had childhood cancer that was treated with abdominal radiation.  What are the signs or symptoms? Most polyps do not cause symptoms. If you have symptoms, they may  include:  Blood coming from your rectum when having a bowel movement.  Blood in your stool.The stool may look dark red or black.  A change in bowel habits, such as constipation or diarrhea.  How is this diagnosed? This condition is diagnosed with a colonoscopy. This is a procedure that uses a lighted, flexible scope to look at the inside of your colon. How is this treated? Treatment for this condition involves removing any polyps that are found. Those polyps will then be tested for cancer. If cancer is found, your health care provider will talk to you about options for colon cancer treatment. Follow these instructions at home: Diet  Eat plenty of fiber, such as fruits, vegetables, and whole grains.  Eat foods that are high in calcium and vitamin D, such as milk, cheese, yogurt, eggs, liver, fish, and broccoli.  Limit foods high in fat, red meats, and processed meats, such as hot dogs, sausage, bacon, and lunch meats.  Maintain a healthy weight, or lose weight if recommended by your health care provider. General instructions  Do not smoke cigarettes.  Do not drink alcohol excessively.  Keep all follow-up visits as told by your health care provider. This is important. This includes keeping regularly scheduled colonoscopies. Talk to your health care provider about when you need a colonoscopy.  Exercise every day or as told by your health care provider. Contact a health care provider if:  You have new or worsening bleeding during a bowel movement.  You have new or increased blood in your stool.  You have a change in bowel habits.  You unexpectedly lose weight. This information is not intended to replace advice given to you by your health care provider. Make sure you discuss any questions you have with your health care provider. Document Released: 09/19/2003 Document Revised: 05/31/2015 Document Reviewed: 11/13/2014 Elsevier Interactive Patient Education  2018 [employer].    Hemorrhoids Hemorrhoids are swollen veins in and around the rectum or anus. There are two types of hemorrhoids:  Internal hemorrhoids. These occur in the veins that are just inside the rectum. They may poke through to the outside and become irritated and painful.  External hemorrhoids. These occur in the veins that are outside of the anus and can be felt as a painful swelling or hard lump near the anus.  Most hemorrhoids do not cause serious problems, and they can be managed with home treatments such as diet and lifestyle changes. If home treatments do not help your symptoms, procedures can be  done to shrink or remove the hemorrhoids. What are the causes? This condition is caused by increased pressure in the anal area. This pressure may result from various things, including:  Constipation.  Straining to have a bowel movement.  Diarrhea.  Pregnancy.  Obesity.  Sitting for long periods of time.  Heavy lifting or other activity that causes you to strain.  Anal sex.  What are the signs or symptoms? Symptoms of this condition include:  Pain.  Anal itching or irritation.  Rectal bleeding.  Leakage of stool (feces).  Anal swelling.  One or more lumps around the anus.  How is this diagnosed? This condition can often be diagnosed through a visual exam. Other exams or tests may also be done, such as:  Examination of the rectal area with a gloved hand (digital rectal exam).  Examination of the anal canal using a small tube (anoscope).  A blood test, if you have lost a significant amount of blood.  A test to look inside the colon (sigmoidoscopy or colonoscopy).  How is this treated? This condition can usually be treated at home. However, various procedures may be done if dietary changes, lifestyle changes, and other home treatments do not help your symptoms. These procedures can help make the hemorrhoids smaller or remove them completely. Some of these procedures  involve surgery, and others do not. Common procedures include:  Rubber band ligation. Rubber bands are placed at the base of the hemorrhoids to cut off the blood supply to them.  Sclerotherapy. Medicine is injected into the hemorrhoids to shrink them.  Infrared coagulation. A type of light energy is used to get rid of the hemorrhoids.  Hemorrhoidectomy surgery. The hemorrhoids are surgically removed, and the veins that supply them are tied off.  Stapled hemorrhoidopexy surgery. A circular stapling device is used to remove the hemorrhoids and use staples to cut off the blood supply to them.  Follow these instructions at home: Eating and drinking  Eat foods that have a lot of fiber in them, such as whole grains, beans, nuts, fruits, and vegetables. Ask your health care provider about taking products that have added fiber (fiber supplements).  Drink enough fluid to keep your urine clear or pale yellow. Managing pain and swelling  Take warm sitz baths for 20 minutes, 3-4 times a day to ease pain and discomfort.  If directed, apply ice to the affected area. Using ice packs between sitz baths may be helpful. ? Put ice in a plastic bag. ? Place a towel between your skin and the bag. ? Leave the ice on for 20 minutes, 2-3 times a day. General instructions  Take over-the-counter and prescription medicines only as told by your health care provider.  Use medicated creams or suppositories as told.  Exercise regularly.  Go to the bathroom when you have the urge to have a bowel movement. Do not wait.  Avoid straining to have bowel movements.  Keep the anal area dry and clean. Use wet toilet paper or moist towelettes after a bowel movement.  Do not sit on the toilet for long periods of time. This increases blood pooling and pain. Contact a health care provider if:  You have increasing pain and swelling that are not controlled by treatment or medicine.  You have uncontrolled  bleeding.  You have difficulty having a bowel movement, or you are unable to have a bowel movement.  You have pain or inflammation outside the area of the hemorrhoids. This information is not intended  to replace advice given to you by your health care provider. Make sure you discuss any questions you have with your health care provider. Document Released: 12/21/1999 Document Revised: 05/23/2015 Document Reviewed: 09/06/2014 Elsevier Interactive Patient Education  [employer].

## 2017-03-14 ENCOUNTER — Encounter: Payer: Self-pay | Admitting: Internal Medicine

## 2017-03-17 ENCOUNTER — Ambulatory Visit (INDEPENDENT_AMBULATORY_CARE_PROVIDER_SITE_OTHER): Payer: PPO | Admitting: General Surgery

## 2017-03-17 ENCOUNTER — Encounter: Payer: Self-pay | Admitting: General Surgery

## 2017-03-17 VITALS — BP 145/85 | HR 83 | Temp 98.7°F | Ht 71.0 in | Wt 202.0 lb

## 2017-03-17 DIAGNOSIS — K649 Unspecified hemorrhoids: Secondary | ICD-10-CM | POA: Diagnosis not present

## 2017-03-17 NOTE — Patient Instructions (Signed)
Hemorrhoids Hemorrhoids are swollen veins in and around the rectum or anus. There are two types of hemorrhoids:  Internal hemorrhoids. These occur in the veins that are just inside the rectum. They may poke through to the outside and become irritated and painful.  External hemorrhoids. These occur in the veins that are outside of the anus and can be felt as a painful swelling or hard lump near the anus.  Most hemorrhoids do not cause serious problems, and they can be managed with home treatments such as diet and lifestyle changes. If home treatments do not help your symptoms, procedures can be done to shrink or remove the hemorrhoids. What are the causes? This condition is caused by increased pressure in the anal area. This pressure may result from various things, including:  Constipation.  Straining to have a bowel movement.  Diarrhea.  Pregnancy.  Obesity.  Sitting for long periods of time.  Heavy lifting or other activity that causes you to strain.  Anal sex.  What are the signs or symptoms? Symptoms of this condition include:  Pain.  Anal itching or irritation.  Rectal bleeding.  Leakage of stool (feces).  Anal swelling.  One or more lumps around the anus.  How is this diagnosed? This condition can often be diagnosed through a visual exam. Other exams or tests may also be done, such as:  Examination of the rectal area with a gloved hand (digital rectal exam).  Examination of the anal canal using a small tube (anoscope).  A blood test, if you have lost a significant amount of blood.  A test to look inside the colon (sigmoidoscopy or colonoscopy).  How is this treated? This condition can usually be treated at home. However, various procedures may be done if dietary changes, lifestyle changes, and other home treatments do not help your symptoms. These procedures can help make the hemorrhoids smaller or remove them completely. Some of these procedures involve  surgery, and others do not. Common procedures include:  Rubber band ligation. Rubber bands are placed at the base of the hemorrhoids to cut off the blood supply to them.  Sclerotherapy. Medicine is injected into the hemorrhoids to shrink them.  Infrared coagulation. A type of light energy is used to get rid of the hemorrhoids.  Hemorrhoidectomy surgery. The hemorrhoids are surgically removed, and the veins that supply them are tied off.  Stapled hemorrhoidopexy surgery. A circular stapling device is used to remove the hemorrhoids and use staples to cut off the blood supply to them.  Follow these instructions at home: Eating and drinking  Eat foods that have a lot of fiber in them, such as whole grains, beans, nuts, fruits, and vegetables. Ask your health care provider about taking products that have added fiber (fiber supplements).  Drink enough fluid to keep your urine clear or pale yellow. Managing pain and swelling  Take warm sitz baths for 20 minutes, 3-4 times a day to ease pain and discomfort.  If directed, apply ice to the affected area. Using ice packs between sitz baths may be helpful. ? Put ice in a plastic bag. ? Place a towel between your skin and the bag. ? Leave the ice on for 20 minutes, 2-3 times a day. General instructions  Take over-the-counter and prescription medicines only as told by your health care provider.  Use medicated creams or suppositories as told.  Exercise regularly.  Go to the bathroom when you have the urge to have a bowel movement. Do not wait.    Avoid straining to have bowel movements.  Keep the anal area dry and clean. Use wet toilet paper or moist towelettes after a bowel movement.  Do not sit on the toilet for long periods of time. This increases blood pooling and pain. Contact a health care provider if:  You have increasing pain and swelling that are not controlled by treatment or medicine.  You have uncontrolled bleeding.  You  have difficulty having a bowel movement, or you are unable to have a bowel movement.  You have pain or inflammation outside the area of the hemorrhoids. This information is not intended to replace advice given to you by your health care provider. Make sure you discuss any questions you have with your health care provider. Document Released: 12/21/1999 Document Revised: 05/23/2015 Document Reviewed: 09/06/2014 Elsevier Interactive Patient Education  2018 [employer]  

## 2017-03-17 NOTE — Progress Notes (Signed)
Brett Matthews; [id number]; 12/02/1932   HPI Patient is an 84-year-old white male who was referred to my care by Dr. Rourk for evaluation and treatment of bleeding hemorrhoidal disease.  Patient states that he has been on Xarelto for chronic atrial fibrillation and underwent a colonoscopy last week due to an episode of significant bleeding.  He states this was his first episode of major bleeding, though he has had occasional blood per rectum.  He denies a family history of colon cancer.  He currently states that his bleeding has stopped.  He denies any significant constipation or diarrhea.  He did state that his hemorrhoids seem to get worse after the prep for his colonoscopy.  He currently has no rectal pain. Past Medical History:  Diagnosis Date  . Anemia, iron deficiency    Negative capsule endoscopy  . Arthritis   . Coronary atherosclerosis of native coronary artery    60 % circumflex stenosis; EF 65%, Cath 6/13  . Diverticula of colon    Pancolonic  . DM (diabetes mellitus), type 2, uncontrolled (HCC)   . Dyspnea    Normal cardiopulmonary function test in July 2008, negative echocardiogram with "bubble" study for inter-cardiac shunt.  . Essential hypertension, benign   . Gastroesophageal reflux disease   . Hemorrhoids   . Hyperplastic colon polyp 04/06/2007  . Hypothyroidism 2003   Status post total thyroidectomy  . Migraines   . Neoplasm of lymphatic and hematopoietic tissue   . Paroxysmal atrial fibrillation (HCC)    Diagnosed 2005  . Skin cancer     Past Surgical History:  Procedure Laterality Date  . BACK SURGERY    . BONE MARROW BIOPSY  1990's  . COLONOSCOPY  04/06/2007   Dr. Rourk- Normal rectum with scattered pancolonic diverticula and slightly redundant elongated colon, diminutive polpy midsigmoid, remainder of colonic mucosa appeared normal. bx= hyperplastic polyp  . COLONOSCOPY N/A 11/29/2012   RMR:Adequate preparation. Friable anal canal/internal hemorrhoids;  otherwise, normal rectum. Normal-appearing colonic mucosa  . ESOPHAGOGASTRODUODENOSCOPY  04/06/2007   Dr. Rourk- normal esophagus s/p nissen fundoplication, intact nissen wrap o/w normal stomach  . ESOPHAGOGASTRODUODENOSCOPY N/A 11/29/2012   RMR: Abnormall distal esophagus bx c/w GERD. prior fundoplication. Gastric polyps  -bx benign. Status post biopsy of normal--appearing duodenal mucosa (bx neg)  . LEFT HEART CATHETERIZATION WITH CORONARY ANGIOGRAM N/A 06/27/2011   Procedure: LEFT HEART CATHETERIZATION WITH CORONARY ANGIOGRAM;  Surgeon: Bartley J Nahser, MD;  Location: [facility] CATH LAB;  Service: Cardiovascular;  Laterality: N/A;  . NISSEN FUNDOPLICATION    . POSTERIOR LAMINECTOMY / DECOMPRESSION LUMBAR SPINE  ~ 1990's  . SKIN CANCER EXCISION     "off my back and chest; from sun"    Family History  Problem Relation Age of Onset  . Cancer Unknown   . Stroke Unknown   . Colon cancer Neg Hx     Current Outpatient Medications on File Prior to Visit  Medication Sig Dispense Refill  . amLODipine (NORVASC) 10 MG tablet Take 10 mg by mouth daily.      . atorvastatin (LIPITOR) 40 MG tablet Take 40 mg by mouth daily.    . Azelastine HCl 0.15 % SOLN Place 1 spray into the nose as needed.    . benazepril (LOTENSIN) 5 MG tablet Take 5 mg by mouth daily.    . carvedilol (COREG) 12.5 MG tablet carvedilol 12.5 mg tablet daily    . diclofenac (VOLTAREN) 75 MG EC tablet Take 1 tablet by mouth daily.     .   DYMISTA 137-50 MCG/ACT SUSP Place 1 spray into the nose daily.     Marland Kitchen levothyroxine (SYNTHROID, LEVOTHROID) 125 MCG tablet Take 125 mcg by mouth daily before breakfast.    . metFORMIN (GLUCOPHAGE) 500 MG tablet Take 1 tablet (500 mg total) by mouth 2 (two) times daily with a meal. HOLD 48 hours, restart on 06/30/2011.    . metoprolol tartrate (LOPRESSOR) 25 MG tablet take 1 tablet by mouth twice a day 180 tablet 3  . pantoprazole (PROTONIX) 40 MG tablet Take 40 mg by mouth daily.      . potassium chloride  (MICRO-K) 10 MEQ CR capsule Take 1 capsule by mouth daily.    . tamsulosin (FLOMAX) 0.4 MG CAPS capsule Take 0.4 mg by mouth daily after supper.      No current facility-administered medications on file prior to visit.     No Known Allergies  Social History   Substance and Sexual Activity  Alcohol Use No    Social History   Tobacco Use  Smoking Status Never Smoker  Smokeless Tobacco Never Used    Review of Systems  Constitutional: Negative.   HENT: Negative.   Eyes: Negative.   Respiratory: Negative.   Cardiovascular: Negative.   Gastrointestinal: Positive for blood in stool.  Genitourinary: Negative.   Musculoskeletal: Negative.   Skin: Negative.   Neurological: Negative.   Endo/Heme/Allergies: Negative.   Psychiatric/Behavioral: Negative.     Objective   Vitals:   03/17/17 1023  BP: (!) 145/85  Pulse: 83  Temp: 98.7 F (37.1 C)    Physical Exam  Constitutional: He is oriented to person, place, and time and well-developed, well-nourished, and in no distress.  HENT:  Head: Normocephalic and atraumatic.  Cardiovascular: Normal heart sounds. Exam reveals no gallop and no friction rub.  No murmur heard. Irregularly irregular  Pulmonary/Chest: Effort normal and breath sounds normal. No respiratory distress. He has no wheezes. He has no rales.  Genitourinary:  Genitourinary Comments: No external hemorrhoids are present at this time.  He does have internal hemorrhoids.  No active bleeding noted.  Surrounding anal skin irritation is present.  Neurological: He is alert and oriented to person, place, and time.  Skin: Skin is warm and dry.  Vitals reviewed.  Colonoscopy report reviewed Assessment  Bleeding hemorrhoids, not currently active Chronic anticoagulation Plan   Patient states that he does not want surgery unless absolutely necessary.  I told him that should he have recurrent episode of significant rectal bleeding, I would recommend hemorrhoidectomy.  He  understands that this may happen again as he is on Xarelto.  He understands this and agrees.  He will return should his hemorrhoidal bleeding recur.  Follow-up as needed.

## 2017-03-18 ENCOUNTER — Telehealth: Payer: Self-pay | Admitting: Internal Medicine

## 2017-03-18 DIAGNOSIS — K649 Unspecified hemorrhoids: Secondary | ICD-10-CM | POA: Diagnosis not present

## 2017-03-18 DIAGNOSIS — Z789 Other specified health status: Secondary | ICD-10-CM | POA: Diagnosis not present

## 2017-03-18 DIAGNOSIS — Z6830 Body mass index (BMI) 30.0-30.9, adult: Secondary | ICD-10-CM | POA: Diagnosis not present

## 2017-03-18 DIAGNOSIS — I4891 Unspecified atrial fibrillation: Secondary | ICD-10-CM | POA: Diagnosis not present

## 2017-03-18 DIAGNOSIS — E1165 Type 2 diabetes mellitus with hyperglycemia: Secondary | ICD-10-CM | POA: Diagnosis not present

## 2017-03-18 DIAGNOSIS — J3489 Other specified disorders of nose and nasal sinuses: Secondary | ICD-10-CM | POA: Diagnosis not present

## 2017-03-18 DIAGNOSIS — Z299 Encounter for prophylactic measures, unspecified: Secondary | ICD-10-CM | POA: Diagnosis not present

## 2017-03-18 DIAGNOSIS — I8390 Asymptomatic varicose veins of unspecified lower extremity: Secondary | ICD-10-CM | POA: Diagnosis not present

## 2017-03-18 NOTE — Telephone Encounter (Signed)
Pt called to say that he is still passing blood when he wipes and goes to the bathroom. He said he had a colonoscopy recently and wanted to set up having his hemorrhoids banded. Please call patient at [phone]

## 2017-03-19 NOTE — Telephone Encounter (Signed)
Spoke with pt and he continues to have some bleeding bright red. Blood is bright red, drips in the tolliet and in underwear. Pt had a TCS last week and said he has been bleeding prior to that appointment. Pt refuses to go to the ED, stating he went and had to pay $100:00 out of pocket. Pt wants to schedule banding procedure. Please advise.

## 2017-03-24 ENCOUNTER — Ambulatory Visit (INDEPENDENT_AMBULATORY_CARE_PROVIDER_SITE_OTHER): Payer: PPO | Admitting: Nurse Practitioner

## 2017-03-24 ENCOUNTER — Encounter: Payer: Self-pay | Admitting: Nurse Practitioner

## 2017-03-24 VITALS — BP 130/82 | HR 82 | Temp 97.0°F | Ht 70.0 in | Wt 204.0 lb

## 2017-03-24 DIAGNOSIS — K649 Unspecified hemorrhoids: Secondary | ICD-10-CM | POA: Insufficient documentation

## 2017-03-24 DIAGNOSIS — K643 Fourth degree hemorrhoids: Secondary | ICD-10-CM

## 2017-03-24 DIAGNOSIS — K625 Hemorrhage of anus and rectum: Secondary | ICD-10-CM

## 2017-03-24 NOTE — Assessment & Plan Note (Signed)
Grade 3-4 hemorrhoids on colonoscopy.  He saw surgeon in follow-up for consideration of surgical hemorrhoidectomy.  Per the surgeon's note the patient declined surgery at that time.  However, the patient states he did not.  At this point given persistent, recurrent bleeding on Xarelto (although he has stopped it because of the bleeding) I will refer him back to the surgeon for hemorrhoidectomy.  I will send a surgeon message through EMR.  I instructed the patient if he does not hear from the surgeon's office to reschedule follow-up to call them in 3 days.  Follow-up in 3 months.

## 2017-03-24 NOTE — Progress Notes (Signed)
cc'ed to pcp °

## 2017-03-24 NOTE — Assessment & Plan Note (Signed)
Persistent, ongoing rectal bleeding.  The patient has stopped Xarelto due to the bleeding.  Denies any anemia symptoms.  I will refer him back to surgeon for hemorrhoidectomy as per above.  I gave him ER precautions related to persistent, frequent bleeding.  Follow-up in 3 months.

## 2017-03-24 NOTE — Patient Instructions (Signed)
1. I will send a message to Dr. Arnoldo Morale (general surgeon) for follow-up and need for removal of your hemorrhoids. 2. If you do not hear from their office in the next 2-3 days, call them and ask for a follow-up appointment. 3. If you have any severe bleeding, worsening fatigue, dizziness, lightheadedness, chest pain, shortness of breath and proceed to the emergency department. 4. Call us if you have any questions or concerns. 5. Follow-up in 3 months.

## 2017-03-24 NOTE — Telephone Encounter (Signed)
The patient was seen in the office.  See office visit notes for further recommendations and details.  In essence, he has been referred back to surgery for surgical resection of his hemorrhoids based on colonoscopy recommendations.  The

## 2017-03-24 NOTE — Progress Notes (Signed)
Referring Provider: Glenda Chroman, MD Primary Care Physician:  Glenda Chroman, MD Primary GI:  Dr. Gala Romney  Chief Complaint  Patient presents with  . Rectal Bleeding    bright red, with BM  . Abdominal Pain    improved    HPI:   Brett Matthews is a 84 y.o. male who presents follow-up on rectal bleeding and abdominal pain.  Patient was last seen in our office 03/02/2017 for the same as well as anemia.  3 of constipation doing well on Linzess 290 mcg daily.  Is on anticoagulation with Xarelto.  ER visit 02/26/2017 with moderate amount of rectal bleeding, stopped Xarelto, CBC low but stable with a hemoglobin 12.5.  Previous colonoscopy as per below.  At his last visit he was doing well, rectal bleeding worse in the previous few weeks and blood described as "dripping blood" with all bowel movements, some lower abdominal pain.  No other significant GI symptoms.  Recommended colonoscopy, blood work, follow-up in 2 months.  Colonoscopy completed 03/12/2017 which found prominent external and internal hemorrhoids as likely source of hematochezia, diverticulosis in the entire examined colon, three to 6 mm polyps in the cecum, otherwise normal.  Surgical pathology found the polyps to be tubular adenoma.  Recommended no future colonoscopy unless new symptoms develop.  Recommended surgical evaluation ASAP for definitive treatment of hemorrhoids.  Saw the surgeon 03/17/2017.  At that time the patient indicated he does not want surgery unless absolutely necessary.  He was told if he has another episode of significant rectal bleeding hemorrhoidectomy would be recommended.  Follow-up for any recurrent hemorrhoid bleeding.  Otherwise, follow-up as needed.  Today he states he is having more hemorrhoid bleeding. States he was told by the surgeon that they weren't bad enough to remove. He is interested in having them removed. Continues to bleed with and without bowel movements; dripping blood. Denies worsening  abdominal pain, N/V, melena, fever, chills, unintentional weight loss. Denies chest pain, dyspnea, dizziness, lightheadedness, syncope, near syncope. Denies any other upper or lower GI symptoms.  Past Medical History:  Diagnosis Date  . Anemia, iron deficiency    Negative capsule endoscopy  . Arthritis   . Coronary atherosclerosis of native coronary artery    60 % circumflex stenosis; EF 65%, Cath 6/13  . Diverticula of colon    Pancolonic  . DM (diabetes mellitus), type 2, uncontrolled ([place])   . Dyspnea    Normal cardiopulmonary function test in July 2008, negative echocardiogram with "bubble" study for inter-cardiac shunt.  . Essential hypertension, benign   . Gastroesophageal reflux disease   . Hemorrhoids   . Hyperplastic colon polyp 04/06/2007  . Hypothyroidism 2003   Status post total thyroidectomy  . Migraines   . Neoplasm of lymphatic and hematopoietic tissue   . Paroxysmal atrial fibrillation ([place])    Diagnosed 2005  . Skin cancer     Past Surgical History:  Procedure Laterality Date  . BACK SURGERY    . BONE MARROW BIOPSY  1990's  . COLONOSCOPY  04/06/2007   Dr. Gala Romney- Normal rectum with scattered pancolonic diverticula and slightly redundant elongated colon, diminutive polpy midsigmoid, remainder of colonic mucosa appeared normal. bx= hyperplastic polyp  . COLONOSCOPY N/A 11/29/2012   [id number] preparation. Friable anal canal/internal hemorrhoids; otherwise, normal rectum. Normal-appearing colonic mucosa  . COLONOSCOPY WITH PROPOFOL N/A 03/12/2017   Procedure: COLONOSCOPY WITH PROPOFOL;  Surgeon: Daneil Dolin, MD;  Location: [facility];  Service: Endoscopy;  Laterality:  N/A;  7:30am  . ESOPHAGOGASTRODUODENOSCOPY  04/06/2007   Dr. Gala Romney- normal esophagus s/p nissen fundoplication, intact nissen wrap o/w normal stomach  . ESOPHAGOGASTRODUODENOSCOPY N/A 11/29/2012   RMR: Abnormall distal esophagus bx c/w GERD. prior fundoplication. Gastric polyps  -bx benign.  Status post biopsy of normal--appearing duodenal mucosa (bx neg)  . LEFT HEART CATHETERIZATION WITH CORONARY ANGIOGRAM N/A 06/27/2011   Procedure: LEFT HEART CATHETERIZATION WITH CORONARY ANGIOGRAM;  Surgeon: Thayer Headings, MD;  Location: [facility] CATH LAB;  Service: Cardiovascular;  Laterality: N/A;  . NISSEN FUNDOPLICATION    . POLYPECTOMY  03/12/2017   Procedure: POLYPECTOMY;  Surgeon: Daneil Dolin, MD;  Location: [facility];  Service: Endoscopy;;  cecal x3  . POSTERIOR LAMINECTOMY / DECOMPRESSION LUMBAR SPINE  ~ 1990's  . SKIN CANCER EXCISION     "off my back and chest; from sun"    Current Outpatient Medications  Medication Sig Dispense Refill  . amLODipine (NORVASC) 10 MG tablet Take 10 mg by mouth daily.      Marland Kitchen atorvastatin (LIPITOR) 40 MG tablet Take 40 mg by mouth daily.    . Azelastine HCl 0.15 % SOLN Place 1 spray into the nose as needed.    . benazepril (LOTENSIN) 5 MG tablet Take 5 mg by mouth daily.    . carvedilol (COREG) 12.5 MG tablet carvedilol 12.5 mg tablet daily    . diclofenac (VOLTAREN) 75 MG EC tablet Take 1 tablet by mouth daily.     Marland Kitchen DYMISTA 137-50 MCG/ACT SUSP Place 1 spray into the nose daily.     Marland Kitchen levothyroxine (SYNTHROID, LEVOTHROID) 125 MCG tablet Take 125 mcg by mouth daily before breakfast.    . metFORMIN (GLUCOPHAGE) 500 MG tablet Take 1 tablet (500 mg total) by mouth 2 (two) times daily with a meal. HOLD 48 hours, restart on 06/30/2011.    . metoprolol tartrate (LOPRESSOR) 25 MG tablet take 1 tablet by mouth twice a day 180 tablet 3  . pantoprazole (PROTONIX) 40 MG tablet Take 40 mg by mouth daily.      . potassium chloride (MICRO-K) 10 MEQ CR capsule Take 1 capsule by mouth daily.    . tamsulosin (FLOMAX) 0.4 MG CAPS capsule Take 0.4 mg by mouth daily after supper.      No current facility-administered medications for this visit.     Allergies as of 03/24/2017  . (No Known Allergies)    Family History  Problem Relation Age of Onset  . Cancer  Unknown   . Stroke Unknown   . Colon cancer Neg Hx     Social History   Socioeconomic History  . Marital status: Widowed    Spouse name: None  . Number of children: None  . Years of education: None  . Highest education level: None  Social Needs  . Financial resource strain: None  . Food insecurity - worry: None  . Food insecurity - inability: None  . Transportation needs - medical: None  . Transportation needs - non-medical: None  Occupational History  . Occupation: Full Time  Tobacco Use  . Smoking status: Never Smoker  . Smokeless tobacco: Never Used  Substance and Sexual Activity  . Alcohol use: No  . Drug use: No  . Sexual activity: No  Other Topics Concern  . None  Social History Narrative  . None    Review of Systems: General: Negative for anorexia, weight loss, fever, chills, fatigue, weakness. ENT: Negative for hoarseness, difficulty swallowing , nasal congestion. CV:  Negative for chest pain, angina, palpitations, dyspnea on exertion, peripheral edema.  Respiratory: Negative for dyspnea at rest, dyspnea on exertion, cough, sputum, wheezing.  GI: See history of present illness. Endo: Negative for unusual weight change.  Heme: Negative for bruising or bleeding.  Physical Exam: BP 130/82   Pulse 82   Temp (!) 97 F (36.1 C) (Oral)   Ht '5\' 10"'$  (1.778 m)   Wt 204 lb (92.5 kg)   BMI 29.27 kg/m  General:   Alert and oriented. Pleasant and cooperative. Well-nourished and well-developed.  Eyes:  Without icterus, sclera clear and conjunctiva pink.  Ears:  Hard of hearing, has bilateral hearing aids in place. Cardiovascular:  S1, S2 present without murmurs appreciated. Extremities without clubbing or edema. Respiratory:  Clear to auscultation bilaterally. No wheezes, rales, or rhonchi. No distress.  Gastrointestinal:  +BS, soft, non-tender and non-distended. No HSM noted. No guarding or rebound. No masses appreciated.  Rectal:  Deferred  Musculoskalatal:   Symmetrical without gross deformities. Normal posture. Neurologic:  Alert and oriented x4;  grossly normal neurologically. Psych:  Alert and cooperative. Normal mood and affect. Heme/Lymph/Immune: No excessive bruising noted.    03/24/2017 10:21 AM   Disclaimer: This note was dictated with voice recognition software. Similar sounding words can inadvertently be transcribed and may not be corrected upon review.

## 2017-03-26 ENCOUNTER — Other Ambulatory Visit: Payer: Self-pay

## 2017-03-26 DIAGNOSIS — K649 Unspecified hemorrhoids: Secondary | ICD-10-CM

## 2017-03-26 DIAGNOSIS — K625 Hemorrhage of anus and rectum: Secondary | ICD-10-CM

## 2017-03-26 NOTE — Telephone Encounter (Signed)
Spoke with EG and pt needs to be referred back  to his surgeon.

## 2017-03-26 NOTE — Telephone Encounter (Signed)
Referral sent to Dr. Arnoldo Morale office via [place].

## 2017-03-30 ENCOUNTER — Ambulatory Visit (INDEPENDENT_AMBULATORY_CARE_PROVIDER_SITE_OTHER): Payer: PPO | Admitting: Otolaryngology

## 2017-03-30 DIAGNOSIS — J310 Chronic rhinitis: Secondary | ICD-10-CM | POA: Diagnosis not present

## 2017-03-31 NOTE — H&P (Signed)
Brett Matthews; [id number]; [birth year]   HPI Patient is an 84 year old white male who was referred to my care by Dr. Gala Romney for evaluation and treatment of bleeding hemorrhoidal disease.  Patient states that he has been on Xarelto for chronic atrial fibrillation and underwent a colonoscopy last week due to an episode of significant bleeding.  He states this was his first episode of major bleeding, though he has had occasional blood per rectum.  He denies a family history of colon cancer.  He currently states that his bleeding has stopped.  He denies any significant constipation or diarrhea.  He did state that his hemorrhoids seem to get worse after the prep for his colonoscopy.  He currently has no rectal pain. Past Medical History:  Diagnosis Date  . Anemia, iron deficiency    Negative capsule endoscopy  . Arthritis   . Coronary atherosclerosis of native coronary artery    60 % circumflex stenosis; EF 65%, Cath 6/13  . Diverticula of colon    Pancolonic  . DM (diabetes mellitus), type 2, uncontrolled ([place])   . Dyspnea    Normal cardiopulmonary function test in July 2008, negative echocardiogram with "bubble" study for inter-cardiac shunt.  . Essential hypertension, benign   . Gastroesophageal reflux disease   . Hemorrhoids   . Hyperplastic colon polyp 04/06/2007  . Hypothyroidism 2003   Status post total thyroidectomy  . Migraines   . Neoplasm of lymphatic and hematopoietic tissue   . Paroxysmal atrial fibrillation ([place])    Diagnosed 2005  . Skin cancer     Past Surgical History:  Procedure Laterality Date  . BACK SURGERY    . BONE MARROW BIOPSY  1990's  . COLONOSCOPY  04/06/2007   Dr. Gala Romney- Normal rectum with scattered pancolonic diverticula and slightly redundant elongated colon, diminutive polpy midsigmoid, remainder of colonic mucosa appeared normal. bx= hyperplastic polyp  . COLONOSCOPY N/A 11/29/2012   [id number] preparation. Friable anal canal/internal hemorrhoids;  otherwise, normal rectum. Normal-appearing colonic mucosa  . ESOPHAGOGASTRODUODENOSCOPY  04/06/2007   Dr. Gala Romney- normal esophagus s/p nissen fundoplication, intact nissen wrap o/w normal stomach  . ESOPHAGOGASTRODUODENOSCOPY N/A 11/29/2012   RMR: Abnormall distal esophagus bx c/w GERD. prior fundoplication. Gastric polyps  -bx benign. Status post biopsy of normal--appearing duodenal mucosa (bx neg)  . LEFT HEART CATHETERIZATION WITH CORONARY ANGIOGRAM N/A 06/27/2011   Procedure: LEFT HEART CATHETERIZATION WITH CORONARY ANGIOGRAM;  Surgeon: Thayer Headings, MD;  Location: [facility] CATH LAB;  Service: Cardiovascular;  Laterality: N/A;  . NISSEN FUNDOPLICATION    . POSTERIOR LAMINECTOMY / DECOMPRESSION LUMBAR SPINE  ~ 1990's  . SKIN CANCER EXCISION     "off my back and chest; from sun"    Family History  Problem Relation Age of Onset  . Cancer Unknown   . Stroke Unknown   . Colon cancer Neg Hx     Current Outpatient Medications on File Prior to Visit  Medication Sig Dispense Refill  . amLODipine (NORVASC) 10 MG tablet Take 10 mg by mouth daily.      Marland Kitchen atorvastatin (LIPITOR) 40 MG tablet Take 40 mg by mouth daily.    . Azelastine HCl 0.15 % SOLN Place 1 spray into the nose as needed.    . benazepril (LOTENSIN) 5 MG tablet Take 5 mg by mouth daily.    . carvedilol (COREG) 12.5 MG tablet carvedilol 12.5 mg tablet daily    . diclofenac (VOLTAREN) 75 MG EC tablet Take 1 tablet by mouth daily.     Marland Kitchen  DYMISTA 137-50 MCG/ACT SUSP Place 1 spray into the nose daily.     Marland Kitchen levothyroxine (SYNTHROID, LEVOTHROID) 125 MCG tablet Take 125 mcg by mouth daily before breakfast.    . metFORMIN (GLUCOPHAGE) 500 MG tablet Take 1 tablet (500 mg total) by mouth 2 (two) times daily with a meal. HOLD 48 hours, restart on 06/30/2011.    . metoprolol tartrate (LOPRESSOR) 25 MG tablet take 1 tablet by mouth twice a day 180 tablet 3  . pantoprazole (PROTONIX) 40 MG tablet Take 40 mg by mouth daily.      . potassium chloride  (MICRO-K) 10 MEQ CR capsule Take 1 capsule by mouth daily.    . tamsulosin (FLOMAX) 0.4 MG CAPS capsule Take 0.4 mg by mouth daily after supper.      No current facility-administered medications on file prior to visit.     No Known Allergies  Social History   Substance and Sexual Activity  Alcohol Use No    Social History   Tobacco Use  Smoking Status Never Smoker  Smokeless Tobacco Never Used    Review of Systems  Constitutional: Negative.   HENT: Negative.   Eyes: Negative.   Respiratory: Negative.   Cardiovascular: Negative.   Gastrointestinal: Positive for blood in stool.  Genitourinary: Negative.   Musculoskeletal: Negative.   Skin: Negative.   Neurological: Negative.   Endo/Heme/Allergies: Negative.   Psychiatric/Behavioral: Negative.     Objective   Vitals:   03/17/17 1023  BP: (!) 145/85  Pulse: 83  Temp: 98.7 F (37.1 C)    Physical Exam  Constitutional: He is oriented to person, place, and time and well-developed, well-nourished, and in no distress.  HENT:  Head: Normocephalic and atraumatic.  Cardiovascular: Normal heart sounds. Exam reveals no gallop and no friction rub.  No murmur heard. Irregularly irregular  Pulmonary/Chest: Effort normal and breath sounds normal. No respiratory distress. He has no wheezes. He has no rales.  Genitourinary:  Genitourinary Comments: No external hemorrhoids are present at this time.  He does have internal hemorrhoids.  No active bleeding noted.  Surrounding anal skin irritation is present.  Neurological: He is alert and oriented to person, place, and time.  Skin: Skin is warm and dry.  Vitals reviewed.  Colonoscopy report reviewed Assessment  Bleeding hemorrhoids Chronic anticoagulation Plan   Due to recurrence of his bleeding, the patient is scheduled for an extensive hemorrhoidectomy on 04/06/2017.  The risks and benefits of the procedure including recurrent bleeding and infection were fully explained to  the patient, who gave informed consent.  He is to stop his Xarelto 2 days prior to the procedure.

## 2017-04-01 NOTE — Patient Instructions (Signed)
Brett Matthews  04/01/2017     @PREFPERIOPPHARMACY @   Your procedure is scheduled on  04/06/2017 .  Report to [facility] at  615   A.M.  Call this number if you have problems the morning of surgery:  [phone]   Remember:  Do not eat food or drink liquids after midnight.  Take these medicines the morning of surgery with A SIP OF WATER  Amlodipine, lotensin, coreg, levothyroxine, metoprolol, flomax.   Do not wear jewelry, make-up or nail polish.  Do not wear lotions, powders, or perfumes, or deodorant.  Do not shave 48 hours prior to surgery.  Men may shave face and neck.  Do not bring valuables to the hospital.  [facility] is not responsible for any belongings or valuables.  Contacts, dentures or bridgework may not be worn into surgery.  Leave your suitcase in the car.  After surgery it may be brought to your room.  For patients admitted to the hospital, discharge time will be determined by your treatment team.  Patients discharged the day of surgery will not be allowed to drive home.   Name and phone number of your driver:   family Special instructions:  None  Please read over the following fact sheets that you were given. Anesthesia Post-op Instructions and Care and Recovery After Surgery       Hemorrhoids Hemorrhoids are swollen veins in and around the rectum or anus. Hemorrhoids can cause pain, itching, or bleeding. Most of the time, they do not cause serious problems. They usually get better with diet changes, lifestyle changes, and other home treatments. Follow these instructions at home: Eating and drinking  Eat foods that have fiber, such as whole grains, beans, nuts, fruits, and vegetables. Ask your doctor about taking products that have added fiber (fibersupplements).  Drink enough fluid to keep your pee (urine) clear or pale yellow. For Pain and Swelling  Take a warm-water bath (sitz bath) for 20 minutes to ease pain. Do this 3-4 times a  day.  If directed, put ice on the painful area. It may be helpful to use ice between your warm baths. ? Put ice in a plastic bag. ? Place a towel between your skin and the bag. ? Leave the ice on for 20 minutes, 2-3 times a day. General instructions  Take over-the-counter and prescription medicines only as told by your doctor. ? Medicated creams and medicines that are inserted into the anus (suppositories) may be used or applied as told.  Exercise often.  Go to the bathroom when you have the urge to poop (to have a bowel movement). Do not wait.  Avoid pushing too hard (straining) when you poop.  Keep the butt area dry and clean. Use wet toilet paper or moist paper towels.  Do not sit on the toilet for a long time. Contact a doctor if:  You have any of these: ? Pain and swelling that do not get better with treatment or medicine. ? Bleeding that will not stop. ? Trouble pooping or you cannot poop. ? Pain or swelling outside the area of the hemorrhoids. This information is not intended to replace advice given to you by your health care provider. Make sure you discuss any questions you have with your health care provider. Document Released: 10/02/2007 Document Revised: 05/31/2015 Document Reviewed: 09/06/2014 Elsevier Interactive Patient Education  2018 [employer]. Surgical Procedures for Hemorrhoids Surgical procedures can be used to  treat hemorrhoids. Hemorrhoids are swollen veins that are inside the rectum (internal hemorrhoids) or around the anus (external hemorrhoids). They are caused by increased pressure in the anal area. This pressure may result from straining to have a bowel movement (constipation), diarrhea, pregnancy, obesity, anal sex, or sitting for long periods of time. Hemorrhoids can cause symptoms such as pain and bleeding. Surgery may be needed if diet changes, lifestyle changes, and other treatments do not help your symptoms. Various surgical methods may be used.  Three common methods are:  Closed hemorrhoidectomy. The hemorrhoids are surgically removed, and the surgical cuts (incisions) are closed with stitches (sutures).  Open hemorrhoidectomy. The hemorrhoids are surgically removed, but the incisions are allowed to heal without sutures.  Stapled hemorrhoidopexy. The hemorrhoids are removed using a device that takes out a ring of excess tissue.  Tell a health care provider about:  Any allergies you have.  All medicines you are taking, including vitamins, herbs, eye drops, creams, and over-the-counter medicines.  Any problems you or family members have had with anesthetic medicines.  Any blood disorders you have.  Any surgeries you have had.  Any medical conditions you have.  Whether you are pregnant or may be pregnant. What are the risks? Generally, this is a safe procedure. However, problems may occur, including:  Infection.  Bleeding.  Allergic reactions to medicines.  Damage to other structures or organs.  Pain.  Constipation.  Difficulty passing urine.  Narrowing of the anal canal (stenosis).  Difficulty controlling bowel movements (incontinence).  What happens before the procedure?  Ask your health care provider about: ? Changing or stopping your regular medicines. This is especially important if you are taking diabetes medicines or blood thinners. ? Taking medicines such as aspirin and ibuprofen. These medicines can thin your blood. Do not take these medicines before your procedure if your health care provider instructs you not to.  You may need to have a procedure to examine the inside of your colon with a scope (colonoscopy). Your health care provider may do this to make sure that there are no other causes for your bleeding or pain.  Follow instructions from your health care provider about eating or drinking restrictions.  You may be instructed to take a laxative and an enema to clean out your colon before  surgery (bowel prep). Carefully follow instructions from your health care provider about bowel prep.  Ask your health care provider how your surgical site will be marked or identified.  You may be given antibiotic medicine to help prevent infection.  Plan to have someone take you home after the procedure. What happens during the procedure?  To reduce your risk of infection: ? Your health care team will wash or sanitize their hands. ? Your skin will be washed with soap.  An IV tube will be inserted into one of your veins.  You will be given one or more of the following: ? A medicine to help you relax (sedative). ? A medicine to numb the area (local anesthetic). ? A medicine to make you fall asleep (general anesthetic). ? A medicine that is injected into an area of your body to numb everything below the injection site (regional anesthetic).  A lubricating jelly may be placed into your rectum.  Your surgeon will insert a short scope (anoscope) into your rectum to examine the hemorrhoids.  One of the following hemorrhoid procedures will be performed. Closed Hemorrhoidectomy  Your surgeon will use surgical instruments to open the tissue around  the hemorrhoids.  The veins that supply the hemorrhoids will be tied off with a suture.  The hemorrhoids will be removed.  The tissue that surrounds the hemorrhoids will be closed with sutures that your body can absorb (absorbable sutures). Open Hemorrhoidectomy  The hemorrhoids will be removed with surgical instruments.  The incisions will be left open to heal without sutures. Stapled Hemorrhoidopexy  Your surgeon will use a circular stapling device to remove the hemorrhoids.  The device will be inserted into your anus. It will remove a circular ring of tissue that includes hemorrhoid tissue and some tissue above the hemorrhoids.  The staples in the device will close the edges of removed tissue. This will cut off the blood supply to  the hemorrhoids and will pull any remaining hemorrhoids back into place. Each of these procedures may vary among health care providers and hospitals. What happens after the procedure?  Your blood pressure, heart rate, breathing rate, and blood oxygen level will be monitored often until the medicines you were given have worn off.  You will be given pain medicine as needed. This information is not intended to replace advice given to you by your health care provider. Make sure you discuss any questions you have with your health care provider. Document Released: 10/20/2008 Document Revised: 05/31/2015 Document Reviewed: 03/20/2014 Elsevier Interactive Patient Education  2018 [place] Anesthesia, Adult General anesthesia is the use of medicines to make a person "go to sleep" (be unconscious) for a medical procedure. General anesthesia is often recommended when a procedure:  Is long.  Requires you to be still or in an unusual position.  Is major and can cause you to lose blood.  Is impossible to do without general anesthesia.  The medicines used for general anesthesia are called general anesthetics. In addition to making you sleep, the medicines:  Prevent pain.  Control your blood pressure.  Relax your muscles.  Tell a health care provider about:  Any allergies you have.  All medicines you are taking, including vitamins, herbs, eye drops, creams, and over-the-counter medicines.  Any problems you or family members have had with anesthetic medicines.  Types of anesthetics you have had in the past.  Any bleeding disorders you have.  Any surgeries you have had.  Any medical conditions you have.  Any history of heart or lung conditions, such as heart failure, sleep apnea, or chronic obstructive pulmonary disease (COPD).  Whether you are pregnant or may be pregnant.  Whether you use tobacco, alcohol, marijuana, or street drugs.  Any history of Production manager.  Any history of depression or anxiety. What are the risks? Generally, this is a safe procedure. However, problems may occur, including:  Allergic reaction to anesthetics.  Lung and heart problems.  Inhaling food or liquids from your stomach into your lungs (aspiration).  Injury to nerves.  Waking up during your procedure and being unable to move (rare).  Extreme agitation or a state of mental confusion (delirium) when you wake up from the anesthetic.  Air in the bloodstream, which can lead to stroke.  These problems are more likely to develop if you are having a major surgery or if you have an advanced medical condition. You can prevent some of these complications by answering all of your health care provider's questions thoroughly and by following all pre-procedure instructions. General anesthesia can cause side effects, including:  Nausea or vomiting  A sore throat from the breathing tube.  Feeling cold or shivery.  Feeling tired, washed out, or achy.  Sleepiness or drowsiness.  Confusion or agitation.  What happens before the procedure? Staying hydrated Follow instructions from your health care provider about hydration, which may include:  Up to 2 hours before the procedure - you may continue to drink clear liquids, such as water, clear fruit juice, black coffee, and plain tea.  Eating and drinking restrictions Follow instructions from your health care provider about eating and drinking, which may include:  8 hours before the procedure - stop eating heavy meals or foods such as meat, fried foods, or fatty foods.  6 hours before the procedure - stop eating light meals or foods, such as toast or cereal.  6 hours before the procedure - stop drinking milk or drinks that contain milk.  2 hours before the procedure - stop drinking clear liquids.  Medicines  Ask your health care provider about: ? Changing or stopping your regular medicines. This is  especially important if you are taking diabetes medicines or blood thinners. ? Taking medicines such as aspirin and ibuprofen. These medicines can thin your blood. Do not take these medicines before your procedure if your health care provider instructs you not to. ? Taking new dietary supplements or medicines. Do not take these during the week before your procedure unless your health care provider approves them.  If you are told to take a medicine or to continue taking a medicine on the day of the procedure, take the medicine with sips of water. General instructions   Ask if you will be going home the same day, the following day, or after a longer hospital stay. ? Plan to have someone take you home. ? Plan to have someone stay with you for the first 24 hours after you leave the hospital or clinic.  For 3-6 weeks before the procedure, try not to use any tobacco products, such as cigarettes, chewing tobacco, and e-cigarettes.  You may brush your teeth on the morning of the procedure, but make sure to spit out the toothpaste. What happens during the procedure?  You will be given anesthetics through a mask and through an IV tube in one of your veins.  You may receive medicine to help you relax (sedative).  As soon as you are asleep, a breathing tube may be used to help you breathe.  An anesthesia specialist will stay with you throughout the procedure. He or she will help keep you comfortable and safe by continuing to give you medicines and adjusting the amount of medicine that you get. He or she will also watch your blood pressure, pulse, and oxygen levels to make sure that the anesthetics do not cause any problems.  If a breathing tube was used to help you breathe, it will be removed before you wake up. The procedure may vary among health care providers and hospitals. What happens after the procedure?  You will wake up, often slowly, after the procedure is complete, usually in a recovery  area.  Your blood pressure, heart rate, breathing rate, and blood oxygen level will be monitored until the medicines you were given have worn off.  You may be given medicine to help you calm down if you feel anxious or agitated.  If you will be going home the same day, your health care provider may check to make sure you can stand, drink, and urinate.  Your health care providers will treat your pain and side effects before you go home.  Do not drive for  24 hours if you received a sedative.  You may: ? Feel nauseous and vomit. ? Have a sore throat. ? Have mental slowness. ? Feel cold or shivery. ? Feel sleepy. ? Feel tired. ? Feel sore or achy, even in parts of your body where you did not have surgery. This information is not intended to replace advice given to you by your health care provider. Make sure you discuss any questions you have with your health care provider. Document Released: 04/01/2007 Document Revised: 06/05/2015 Document Reviewed: 12/07/2014 Elsevier Interactive Patient Education  2018 [place] Anesthesia, Adult, Care After These instructions provide you with information about caring for yourself after your procedure. Your health care provider may also give you more specific instructions. Your treatment has been planned according to current medical practices, but problems sometimes occur. Call your health care provider if you have any problems or questions after your procedure. What can I expect after the procedure? After the procedure, it is common to have:  Vomiting.  A sore throat.  Mental slowness.  It is common to feel:  Nauseous.  Cold or shivery.  Sleepy.  Tired.  Sore or achy, even in parts of your body where you did not have surgery.  Follow these instructions at home: For at least 24 hours after the procedure:  Do not: ? Participate in activities where you could fall or become injured. ? Drive. ? Use heavy machinery. ? Drink  alcohol. ? Take sleeping pills or medicines that cause drowsiness. ? Make important decisions or sign legal documents. ? Take care of children on your own.  Rest. Eating and drinking  If you vomit, drink water, juice, or soup when you can drink without vomiting.  Drink enough fluid to keep your urine clear or pale yellow.  Make sure you have little or no nausea before eating solid foods.  Follow the diet recommended by your health care provider. General instructions  Have a responsible adult stay with you until you are awake and alert.  Return to your normal activities as told by your health care provider. Ask your health care provider what activities are safe for you.  Take over-the-counter and prescription medicines only as told by your health care provider.  If you smoke, do not smoke without supervision.  Keep all follow-up visits as told by your health care provider. This is important. Contact a health care provider if:  You continue to have nausea or vomiting at home, and medicines are not helpful.  You cannot drink fluids or start eating again.  You cannot urinate after 8-12 hours.  You develop a skin rash.  You have fever.  You have increasing redness at the site of your procedure. Get help right away if:  You have difficulty breathing.  You have chest pain.  You have unexpected bleeding.  You feel that you are having a life-threatening or urgent problem. This information is not intended to replace advice given to you by your health care provider. Make sure you discuss any questions you have with your health care provider. Document Released: 03/31/2000 Document Revised: 05/28/2015 Document Reviewed: 12/07/2014 Elsevier Interactive Patient Education  [employer].

## 2017-04-02 ENCOUNTER — Encounter (HOSPITAL_COMMUNITY)
Admission: RE | Admit: 2017-04-02 | Discharge: 2017-04-02 | Disposition: A | Discharge status: Home / Self Care | Payer: PPO | Source: Ambulatory Visit | Attending: General Surgery | Admitting: General Surgery

## 2017-04-02 ENCOUNTER — Encounter (HOSPITAL_COMMUNITY): Payer: Self-pay

## 2017-04-02 ENCOUNTER — Other Ambulatory Visit: Payer: Self-pay

## 2017-04-02 DIAGNOSIS — K219 Gastro-esophageal reflux disease without esophagitis: Secondary | ICD-10-CM | POA: Insufficient documentation

## 2017-04-02 DIAGNOSIS — K643 Fourth degree hemorrhoids: Secondary | ICD-10-CM | POA: Diagnosis not present

## 2017-04-02 DIAGNOSIS — E039 Hypothyroidism, unspecified: Secondary | ICD-10-CM | POA: Diagnosis not present

## 2017-04-02 DIAGNOSIS — I10 Essential (primary) hypertension: Secondary | ICD-10-CM | POA: Insufficient documentation

## 2017-04-02 DIAGNOSIS — Z7984 Long term (current) use of oral hypoglycemic drugs: Secondary | ICD-10-CM | POA: Insufficient documentation

## 2017-04-02 DIAGNOSIS — K625 Hemorrhage of anus and rectum: Secondary | ICD-10-CM | POA: Diagnosis not present

## 2017-04-02 DIAGNOSIS — Z79890 Hormone replacement therapy (postmenopausal): Secondary | ICD-10-CM | POA: Diagnosis not present

## 2017-04-02 DIAGNOSIS — E785 Hyperlipidemia, unspecified: Secondary | ICD-10-CM | POA: Insufficient documentation

## 2017-04-02 DIAGNOSIS — I2510 Atherosclerotic heart disease of native coronary artery without angina pectoris: Secondary | ICD-10-CM | POA: Insufficient documentation

## 2017-04-02 DIAGNOSIS — E1165 Type 2 diabetes mellitus with hyperglycemia: Secondary | ICD-10-CM | POA: Diagnosis not present

## 2017-04-02 DIAGNOSIS — Z79899 Other long term (current) drug therapy: Secondary | ICD-10-CM | POA: Insufficient documentation

## 2017-04-02 DIAGNOSIS — D649 Anemia, unspecified: Secondary | ICD-10-CM | POA: Insufficient documentation

## 2017-04-02 DIAGNOSIS — I480 Paroxysmal atrial fibrillation: Secondary | ICD-10-CM | POA: Diagnosis not present

## 2017-04-02 DIAGNOSIS — Z01818 Encounter for other preprocedural examination: Secondary | ICD-10-CM | POA: Diagnosis not present

## 2017-04-02 LAB — CBC WITH DIFFERENTIAL/PLATELET
BASOS ABS: 0 10*3/uL (ref 0.0–0.1)
BASOS PCT: 1 %
EOS PCT: 2 %
Eosinophils Absolute: 0.2 10*3/uL (ref 0.0–0.7)
HCT: 37.7 % — ABNORMAL LOW (ref 39.0–52.0)
Hemoglobin: 12 g/dL — ABNORMAL LOW (ref 13.0–17.0)
LYMPHS PCT: 19 %
Lymphs Abs: 1.2 10*3/uL (ref 0.7–4.0)
MCH: 27.5 pg (ref 26.0–34.0)
MCHC: 31.8 g/dL (ref 30.0–36.0)
MCV: 86.5 fL (ref 78.0–100.0)
Monocytes Absolute: 0.6 10*3/uL (ref 0.1–1.0)
Monocytes Relative: 10 %
Neutro Abs: 4.3 10*3/uL (ref 1.7–7.7)
Neutrophils Relative %: 68 %
PLATELETS: 331 10*3/uL (ref 150–400)
RBC: 4.36 MIL/uL (ref 4.22–5.81)
RDW: 13.8 % (ref 11.5–15.5)
WBC: 6.2 10*3/uL (ref 4.0–10.5)

## 2017-04-02 LAB — BASIC METABOLIC PANEL
ANION GAP: 10 (ref 5–15)
BUN: 12 mg/dL (ref 6–20)
CALCIUM: 9.6 mg/dL (ref 8.9–10.3)
CO2: 24 mmol/L (ref 22–32)
Chloride: 103 mmol/L (ref 101–111)
Creatinine, Ser: 0.89 mg/dL (ref 0.61–1.24)
GFR calc Af Amer: >60 mL/min (ref >60)
Glucose, Bld: 98 mg/dL (ref 65–99)
Potassium: 3.9 mmol/L (ref 3.5–5.1)
SODIUM: 137 mmol/L (ref 135–145)

## 2017-04-02 LAB — GLUCOSE, CAPILLARY: Glucose-Capillary: 84 mg/dL (ref 65–99)

## 2017-04-02 LAB — HEMOGLOBIN A1C
Hgb A1c MFr Bld: 6.4 % — ABNORMAL HIGH (ref 4.8–5.6)
MEAN PLASMA GLUCOSE: 136.98 mg/dL

## 2017-04-03 NOTE — Pre-Procedure Instructions (Signed)
HgbA1C routed to PCP. 

## 2017-04-06 ENCOUNTER — Ambulatory Visit (HOSPITAL_COMMUNITY): Payer: PPO | Admitting: Anesthesiology

## 2017-04-06 ENCOUNTER — Encounter (HOSPITAL_COMMUNITY): Payer: Self-pay | Admitting: Anesthesiology

## 2017-04-06 ENCOUNTER — Encounter (HOSPITAL_COMMUNITY)
Admission: RE | Disposition: A | Discharge status: Home / Self Care | Payer: Self-pay | Source: Ambulatory Visit | Attending: General Surgery

## 2017-04-06 ENCOUNTER — Ambulatory Visit (HOSPITAL_COMMUNITY)
Admission: RE | Admit: 2017-04-06 | Discharge: 2017-04-06 | Disposition: A | Discharge status: Home / Self Care | Payer: PPO | Source: Ambulatory Visit | Attending: General Surgery | Admitting: General Surgery

## 2017-04-06 DIAGNOSIS — Z7984 Long term (current) use of oral hypoglycemic drugs: Secondary | ICD-10-CM | POA: Insufficient documentation

## 2017-04-06 DIAGNOSIS — Z791 Long term (current) use of non-steroidal anti-inflammatories (NSAID): Secondary | ICD-10-CM | POA: Insufficient documentation

## 2017-04-06 DIAGNOSIS — I2510 Atherosclerotic heart disease of native coronary artery without angina pectoris: Secondary | ICD-10-CM | POA: Insufficient documentation

## 2017-04-06 DIAGNOSIS — E039 Hypothyroidism, unspecified: Secondary | ICD-10-CM | POA: Diagnosis not present

## 2017-04-06 DIAGNOSIS — E119 Type 2 diabetes mellitus without complications: Secondary | ICD-10-CM | POA: Insufficient documentation

## 2017-04-06 DIAGNOSIS — K644 Residual hemorrhoidal skin tags: Secondary | ICD-10-CM | POA: Insufficient documentation

## 2017-04-06 DIAGNOSIS — K219 Gastro-esophageal reflux disease without esophagitis: Secondary | ICD-10-CM | POA: Insufficient documentation

## 2017-04-06 DIAGNOSIS — I10 Essential (primary) hypertension: Secondary | ICD-10-CM | POA: Diagnosis not present

## 2017-04-06 DIAGNOSIS — Z79890 Hormone replacement therapy (postmenopausal): Secondary | ICD-10-CM | POA: Insufficient documentation

## 2017-04-06 DIAGNOSIS — K649 Unspecified hemorrhoids: Secondary | ICD-10-CM | POA: Diagnosis not present

## 2017-04-06 DIAGNOSIS — Z79899 Other long term (current) drug therapy: Secondary | ICD-10-CM | POA: Insufficient documentation

## 2017-04-06 DIAGNOSIS — K648 Other hemorrhoids: Secondary | ICD-10-CM | POA: Diagnosis not present

## 2017-04-06 DIAGNOSIS — Z85828 Personal history of other malignant neoplasm of skin: Secondary | ICD-10-CM | POA: Diagnosis not present

## 2017-04-06 HISTORY — PX: HEMORRHOID SURGERY: SHX153

## 2017-04-06 LAB — GLUCOSE, CAPILLARY
GLUCOSE-CAPILLARY: 78 mg/dL (ref 65–99)
GLUCOSE-CAPILLARY: 88 mg/dL (ref 65–99)
Glucose-Capillary: 67 mg/dL (ref 65–99)

## 2017-04-06 SURGERY — HEMORRHOIDECTOMY
Anesthesia: General | Site: Rectum

## 2017-04-06 MED ORDER — FENTANYL CITRATE (PF) 100 MCG/2ML IJ SOLN
INTRAMUSCULAR | Status: DC | PRN
  Administered 2017-04-06 (×2): 25 ug via INTRAVENOUS

## 2017-04-06 MED ORDER — MIDAZOLAM HCL 5 MG/5ML IJ SOLN
INTRAMUSCULAR | Status: DC | PRN
  Administered 2017-04-06 (×2): 1 mg via INTRAVENOUS

## 2017-04-06 MED ORDER — HYDROCODONE-ACETAMINOPHEN 5-325 MG PO TABS: 1.0000 | ORAL_TABLET | Freq: Four times a day (QID) | ORAL | 0 refills | Status: DC | PRN

## 2017-04-06 MED ORDER — ONDANSETRON HCL 4 MG/2ML IJ SOLN
INTRAMUSCULAR | Status: AC
  Filled 2017-04-06: qty 2

## 2017-04-06 MED ORDER — BUPIVACAINE LIPOSOME 1.3 % IJ SUSP
INTRAMUSCULAR | Status: DC | PRN
  Administered 2017-04-06: 15 mL

## 2017-04-06 MED ORDER — LIDOCAINE HCL (CARDIAC) 20 MG/ML IV SOLN
INTRAVENOUS | Status: DC | PRN
  Administered 2017-04-06: 40 mg via INTRAVENOUS

## 2017-04-06 MED ORDER — PROPOFOL 10 MG/ML IV BOLUS
INTRAVENOUS | Status: DC | PRN
  Administered 2017-04-06: 120 mg via INTRAVENOUS
  Administered 2017-04-06: 20 mg via INTRAVENOUS
  Administered 2017-04-06: 30 mg via INTRAVENOUS

## 2017-04-06 MED ORDER — EPHEDRINE SULFATE 50 MG/ML IJ SOLN
INTRAMUSCULAR | Status: DC | PRN
  Administered 2017-04-06: 10 mg via INTRAVENOUS

## 2017-04-06 MED ORDER — LACTATED RINGERS IV SOLN
INTRAVENOUS | Status: DC
  Administered 2017-04-06: 07:00:00 via INTRAVENOUS

## 2017-04-06 MED ORDER — PROPOFOL 10 MG/ML IV BOLUS
INTRAVENOUS | Status: AC
  Filled 2017-04-06: qty 20

## 2017-04-06 MED ORDER — ONDANSETRON HCL 4 MG/2ML IJ SOLN: 4.0000 mg | Freq: Once | INTRAMUSCULAR | Status: DC | PRN

## 2017-04-06 MED ORDER — HYDROMORPHONE HCL 1 MG/ML IJ SOLN: 0.2500 mg | INTRAMUSCULAR | Status: DC | PRN

## 2017-04-06 MED ORDER — FENTANYL CITRATE (PF) 100 MCG/2ML IJ SOLN
INTRAMUSCULAR | Status: AC
  Filled 2017-04-06: qty 2

## 2017-04-06 MED ORDER — PROPOFOL 10 MG/ML IV BOLUS
INTRAVENOUS | Status: AC
  Filled 2017-04-06: qty 40

## 2017-04-06 MED ORDER — HYDROCODONE-ACETAMINOPHEN 7.5-325 MG PO TABS: 1.0000 | ORAL_TABLET | Freq: Once | ORAL | Status: DC | PRN

## 2017-04-06 MED ORDER — EPHEDRINE SULFATE 50 MG/ML IJ SOLN
INTRAMUSCULAR | Status: AC
  Filled 2017-04-06: qty 1

## 2017-04-06 MED ORDER — LIDOCAINE VISCOUS 2 % MT SOLN
OROMUCOSAL | Status: AC
  Filled 2017-04-06: qty 15

## 2017-04-06 MED ORDER — CHLORHEXIDINE GLUCONATE CLOTH 2 % EX PADS: 6.0000 | MEDICATED_PAD | Freq: Once | CUTANEOUS | Status: DC

## 2017-04-06 MED ORDER — LIDOCAINE VISCOUS 2 % MT SOLN
OROMUCOSAL | Status: DC | PRN
  Administered 2017-04-06: 1 via OROMUCOSAL

## 2017-04-06 MED ORDER — SUCCINYLCHOLINE CHLORIDE 20 MG/ML IJ SOLN
INTRAMUSCULAR | Status: AC
  Filled 2017-04-06: qty 1

## 2017-04-06 MED ORDER — SODIUM CHLORIDE 0.9 % IJ SOLN
INTRAMUSCULAR | Status: AC
  Filled 2017-04-06: qty 10

## 2017-04-06 MED ORDER — KETOROLAC TROMETHAMINE 30 MG/ML IJ SOLN
30.0000 mg | Freq: Once | INTRAMUSCULAR | Status: AC
  Administered 2017-04-06: 30 mg via INTRAVENOUS
  Filled 2017-04-06: qty 1

## 2017-04-06 MED ORDER — SODIUM CHLORIDE 0.9 % IR SOLN
Status: DC | PRN
  Administered 2017-04-06: 1000 mL

## 2017-04-06 MED ORDER — HEMOSTATIC AGENTS (NO CHARGE) OPTIME
TOPICAL | Status: DC | PRN
  Administered 2017-04-06: 1 via TOPICAL

## 2017-04-06 MED ORDER — BUPIVACAINE LIPOSOME 1.3 % IJ SUSP
INTRAMUSCULAR | Status: AC
  Filled 2017-04-06: qty 20

## 2017-04-06 MED ORDER — PHENYLEPHRINE 40 MCG/ML (10ML) SYRINGE FOR IV PUSH (FOR BLOOD PRESSURE SUPPORT)
PREFILLED_SYRINGE | INTRAVENOUS | Status: AC
  Filled 2017-04-06: qty 10

## 2017-04-06 MED ORDER — CEFOTETAN DISODIUM-DEXTROSE 2-2.08 GM-%(50ML) IV SOLR
2.0000 g | INTRAVENOUS | Status: AC
  Administered 2017-04-06: 2 g via INTRAVENOUS
  Filled 2017-04-06: qty 50

## 2017-04-06 MED ORDER — ROCURONIUM BROMIDE 50 MG/5ML IV SOLN
INTRAVENOUS | Status: AC
  Filled 2017-04-06: qty 1

## 2017-04-06 MED ORDER — MIDAZOLAM HCL 2 MG/2ML IJ SOLN
INTRAMUSCULAR | Status: AC
  Filled 2017-04-06: qty 2

## 2017-04-06 MED ORDER — LIDOCAINE HCL (PF) 1 % IJ SOLN
INTRAMUSCULAR | Status: AC
  Filled 2017-04-06: qty 5

## 2017-04-06 SURGICAL SUPPLY — 28 items
BAG HAMPER (MISCELLANEOUS) ×3 IMPLANT
CLOTH BEACON ORANGE TIMEOUT ST (SAFETY) ×3 IMPLANT
COVER LIGHT HANDLE STERIS (MISCELLANEOUS) ×6 IMPLANT
DECANTER SPIKE VIAL GLASS SM (MISCELLANEOUS) ×3 IMPLANT
DRAPE HALF SHEET 40X57 (DRAPES) ×3 IMPLANT
DRAPE PROXIMA HALF (DRAPES) ×3 IMPLANT
ELECT REM PT RETURN 9FT ADLT (ELECTROSURGICAL) ×3
ELECTRODE REM PT RTRN 9FT ADLT (ELECTROSURGICAL) ×1 IMPLANT
GAUZE SPONGE 4X4 12PLY STRL (GAUZE/BANDAGES/DRESSINGS) ×6 IMPLANT
GLOVE BIOGEL PI IND STRL 7.0 (GLOVE) ×1 IMPLANT
GLOVE BIOGEL PI INDICATOR 7.0 (GLOVE) ×4
GLOVE SURG SS PI 7.5 STRL IVOR (GLOVE) ×3 IMPLANT
GOWN STRL REUS W/ TWL XL LVL3 (GOWN DISPOSABLE) ×1 IMPLANT
GOWN STRL REUS W/TWL LRG LVL3 (GOWN DISPOSABLE) ×3 IMPLANT
GOWN STRL REUS W/TWL XL LVL3 (GOWN DISPOSABLE) ×3
HEMOSTAT SURGICEL 4X8 (HEMOSTASIS) ×3 IMPLANT
KIT ROOM TURNOVER AP CYSTO (KITS) ×3 IMPLANT
LIGASURE IMPACT 36 18CM CVD LR (INSTRUMENTS) ×3 IMPLANT
MANIFOLD NEPTUNE II (INSTRUMENTS) ×3 IMPLANT
NEEDLE HYPO 22GX1.5 SAFETY (NEEDLE) ×3 IMPLANT
NS IRRIG 1000ML POUR BTL (IV SOLUTION) ×3 IMPLANT
PACK PERI GYN (CUSTOM PROCEDURE TRAY) ×3 IMPLANT
PAD ARMBOARD 7.5X6 YLW CONV (MISCELLANEOUS) ×3 IMPLANT
SET BASIN LINEN APH (SET/KITS/TRAYS/PACK) ×3 IMPLANT
SURGILUBE 3G PEEL PACK STRL (MISCELLANEOUS) ×3 IMPLANT
SUT SILK 0 FSL (SUTURE) ×3 IMPLANT
SUT VIC AB 2-0 CT2 27 (SUTURE) ×3 IMPLANT
SYR 20CC LL (SYRINGE) ×2 IMPLANT

## 2017-04-06 NOTE — Transfer of Care (Signed)
Immediate Anesthesia Transfer of Care Note  Patient: Brett Matthews  Procedure(s) Performed: EXTENSIVE HEMORRHOIDECTOMY (N/A Rectum)  Patient Location: PACU  Anesthesia Type:General  Level of Consciousness: awake, oriented and patient cooperative  Airway & Oxygen Therapy: Patient Spontanous Breathing and Patient connected to face mask oxygen  Post-op Assessment: Report given to RN and Post -op Vital signs reviewed and stable  Post vital signs: Reviewed and stable  Last Vitals:  Vitals Value Taken Time  BP 124/77 04/06/2017  8:18 AM  Temp    Pulse 72 04/06/2017  8:19 AM  Resp 13 04/06/2017  8:19 AM  SpO2 89 % 04/06/2017  8:19 AM  Vitals shown include unvalidated device data.  Last Pain:  Vitals:   04/06/17 0651  TempSrc: Oral  PainSc: 0-No pain         Complications: No apparent anesthesia complications

## 2017-04-06 NOTE — Anesthesia Postprocedure Evaluation (Signed)
Anesthesia Post Note Late entry  For 0842  Patient: Brett Matthews  Procedure(s) Performed: EXTENSIVE HEMORRHOIDECTOMY (N/A Rectum)  Patient location during evaluation: PACU Anesthesia Type: General Level of consciousness: awake and alert, oriented and patient cooperative Pain management: pain level controlled Vital Signs Assessment: post-procedure vital signs reviewed and stable Respiratory status: spontaneous breathing and respiratory function stable Cardiovascular status: stable Postop Assessment: no apparent nausea or vomiting Anesthetic complications: no     Last Vitals:  Vitals:   04/06/17 0900 04/06/17 0913  BP: 130/74 129/82  Pulse: 68 67  Resp: 19 16  Temp:  36.8 C  SpO2: 95% 93%    Last Pain:  Vitals:   04/06/17 0913  TempSrc: Oral  PainSc:                  ADAMS, AMY A

## 2017-04-06 NOTE — Anesthesia Procedure Notes (Signed)
Procedure Name: LMA Insertion Date/Time: 04/06/2017 7:35 AM Performed by: Andree Elk, Amy A, CRNA Pre-anesthesia Checklist: Patient identified, Emergency Drugs available, Suction available and Patient being monitored Oxygen Delivery Method: Simple face mask Induction Type: IV induction Ventilation: Mask ventilation without difficulty LMA Size: 4.0 Number of attempts: 1 Placement Confirmation: positive ETCO2 and breath sounds checked- equal and bilateral Tube secured with: Tape Dental Injury: Teeth and Oropharynx as per pre-operative assessment

## 2017-04-06 NOTE — Discharge Instructions (Signed)
PATIENT INSTRUCTIONS POST-ANESTHESIA  IMMEDIATELY FOLLOWING SURGERY:  Do not drive or operate machinery for the first twenty four hours after surgery.  Do not make any important decisions for twenty four hours after surgery or while taking narcotic pain medications or sedatives.  If you develop intractable nausea and vomiting or a severe headache please notify your doctor immediately.  FOLLOW-UP:  Please make an appointment with your surgeon as instructed. You do not need to follow up with anesthesia unless specifically instructed to do so.  WOUND CARE INSTRUCTIONS (if applicable):  Keep a dry clean dressing on the anesthesia/puncture wound site if there is drainage.  Once the wound has quit draining you may leave it open to air.  Generally you should leave the bandage intact for twenty four hours unless there is drainage.  If the epidural site drains for more than 36-48 hours please call the anesthesia department.  QUESTIONS?:  Please feel free to call your physician or the hospital operator if you have any questions, and they will be happy to assist you.      Surgical Procedures for Hemorrhoids, Care After Refer to this sheet in the next few weeks. These instructions provide you with information about caring for yourself after your procedure. Your health care provider may also give you more specific instructions. Your treatment has been planned according to current medical practices, but problems sometimes occur. Call your health care provider if you have any problems or questions after your procedure. What can I expect after the procedure? After the procedure, it is common to have:  Rectal pain.  Pain when you are having a bowel movement.  Slight rectal bleeding.  Follow these instructions at home: Medicines  Take over-the-counter and prescription medicines only as told by your health care provider.  Do not drive or operate heavy machinery while taking prescription pain  medicine.  Use a stool softener or a bulk laxative as told by your health care provider. Activity  Rest at home. Return to your normal activities as told by your health care provider.  Do not lift anything that is heavier than 10 lb (4.5 kg).  Do not sit for long periods of time. Take a walk every day or as told by your health care provider.  Do not strain to have a bowel movement. Do not spend a long time sitting on the toilet. Eating and drinking  Eat foods that contain fiber, such as whole grains, beans, nuts, fruits, and vegetables.  Drink enough fluid to keep your urine clear or pale yellow. General instructions  Sit in a warm bath 2-3 times per day to relieve soreness or itching.  Keep all follow-up visits as told by your health care provider. This is important. Contact a health care provider if:  Your pain medicine is not helping.  You have a fever or chills.  You become constipated.  You have trouble passing urine. Get help right away if:  You have very bad rectal pain.  You have heavy bleeding from your rectum. This information is not intended to replace advice given to you by your health care provider. Make sure you discuss any questions you have with your health care provider. Document Released: 03/15/2003 Document Revised: 05/31/2015 Document Reviewed: 03/20/2014 Elsevier Interactive Patient Education  [employer].

## 2017-04-06 NOTE — Interval H&P Note (Signed)
History and Physical Interval Note:  04/06/2017 7:16 AM  Brett Matthews  has presented today for surgery, with the diagnosis of bleeding hemorrhoids  The various methods of treatment have been discussed with the patient and family. After consideration of risks, benefits and other options for treatment, the patient has consented to  Procedure(s): EXTENSIVE HEMORRHOIDECTOMY (N/A) as a surgical intervention .  The patient's history has been reviewed, patient examined, no change in status, stable for surgery.  I have reviewed the patient's chart and labs.  Questions were answered to the patient's satisfaction.     Aviva Signs

## 2017-04-06 NOTE — Anesthesia Preprocedure Evaluation (Signed)
Anesthesia Evaluation  Patient identified by MRN, date of birth, ID band Patient awake    Airway Mallampati: II  TM Distance: >3 FB Neck ROM: Full  Mouth opening: Limited Mouth Opening  Dental no notable dental hx.    Pulmonary shortness of breath,    Pulmonary exam normal breath sounds clear to auscultation       Cardiovascular hypertension, + CAD  Normal cardiovascular exam Rhythm:Regular Rate:Normal     Neuro/Psych  Headaches,    GI/Hepatic GERD  ,  Endo/Other  diabetesHypothyroidism   Renal/GU      Musculoskeletal  (+) Arthritis ,   Abdominal   Peds  Hematology   Anesthesia Other Findings   Reproductive/Obstetrics                             Anesthesia Physical Anesthesia Plan  ASA: III  Anesthesia Plan: General   Post-op Pain Management:    Induction: Intravenous  PONV Risk Score and Plan:   Airway Management Planned:   Additional Equipment:   Intra-op Plan:   Post-operative Plan: Extubation in OR  Informed Consent: I have reviewed the patients History and Physical, chart, labs and discussed the procedure including the risks, benefits and alternatives for the proposed anesthesia with the patient or authorized representative who has indicated his/her understanding and acceptance.   Dental advisory given  Plan Discussed with: CRNA  Anesthesia Plan Comments:         Anesthesia Quick Evaluation

## 2017-04-06 NOTE — Op Note (Signed)
Patient:  FABIEN TRAVELSTEAD  DOB:  1932/01/27  MRN:  [id number]   Preop Diagnosis: Bleeding hemorrhoidal disease  Postop Diagnosis: Same  Procedure: Extensive hemorrhoidectomy, greater than 2 columns  Surgeon: Aviva Signs, MD  Anes: General endotracheal  Indications: Patient is an 84 year old white male who presents with bleeding and prolapsing hemorrhoidal disease.  The risks and benefits of the procedure including bleeding, infection, and recurrence of the hemorrhoidal disease were fully explained to the patient, who gave informed consent.  Procedure note: The patient was placed in the lithotomy position after induction of general endotracheal anesthesia.  The perineum was prepped and draped using the usual sterile technique with Betadine.  Surgical site confirmation was performed.  On rectal examination, the patient had prolapsing internal and external hemorrhoids at the 2:00, 5:00, and 7:00 positions.  Some laxity of the external sphincter mechanism was noted.  All 3 columns were excised in a Ferguson type manner using the LigaSure.  No abnormal bleeding is noted at the end of the procedure.  Care was taken to avoid the external sphincter mechanism.  Exparel was instilled into the surrounding perineum.  Surgicel and Viscous Xylocaine rectal packing was then placed.  All tape and needle counts were correct at the end of the procedure.  The patient was extubated in the operating room and transferred to PACU in stable condition.  Complications: None  EBL: Minimal  Specimen: Hemorrhoids

## 2017-04-07 ENCOUNTER — Encounter (HOSPITAL_COMMUNITY): Payer: Self-pay | Admitting: General Surgery

## 2017-04-08 ENCOUNTER — Emergency Department (HOSPITAL_COMMUNITY)
Admission: EM | Admit: 2017-04-08 | Discharge: 2017-04-08 | Disposition: A | Discharge status: Home / Self Care | Payer: PPO | Attending: Emergency Medicine | Admitting: Emergency Medicine

## 2017-04-08 ENCOUNTER — Other Ambulatory Visit: Payer: Self-pay

## 2017-04-08 ENCOUNTER — Encounter (HOSPITAL_COMMUNITY): Payer: Self-pay | Admitting: Emergency Medicine

## 2017-04-08 DIAGNOSIS — Z85828 Personal history of other malignant neoplasm of skin: Secondary | ICD-10-CM | POA: Insufficient documentation

## 2017-04-08 DIAGNOSIS — E039 Hypothyroidism, unspecified: Secondary | ICD-10-CM | POA: Diagnosis not present

## 2017-04-08 DIAGNOSIS — K625 Hemorrhage of anus and rectum: Secondary | ICD-10-CM | POA: Diagnosis not present

## 2017-04-08 DIAGNOSIS — I2510 Atherosclerotic heart disease of native coronary artery without angina pectoris: Secondary | ICD-10-CM | POA: Insufficient documentation

## 2017-04-08 DIAGNOSIS — R1030 Lower abdominal pain, unspecified: Secondary | ICD-10-CM | POA: Diagnosis not present

## 2017-04-08 DIAGNOSIS — E119 Type 2 diabetes mellitus without complications: Secondary | ICD-10-CM | POA: Insufficient documentation

## 2017-04-08 DIAGNOSIS — Z9889 Other specified postprocedural states: Secondary | ICD-10-CM | POA: Insufficient documentation

## 2017-04-08 LAB — COMPREHENSIVE METABOLIC PANEL
ALK PHOS: 86 U/L (ref 38–126)
ALT: 17 U/L (ref 17–63)
AST: 17 U/L (ref 15–41)
Albumin: 3.9 g/dL (ref 3.5–5.0)
Anion gap: 9 (ref 5–15)
BILIRUBIN TOTAL: 0.5 mg/dL (ref 0.3–1.2)
BUN: 8 mg/dL (ref 6–20)
CO2: 24 mmol/L (ref 22–32)
CREATININE: 0.8 mg/dL (ref 0.61–1.24)
Calcium: 9.8 mg/dL (ref 8.9–10.3)
Chloride: 105 mmol/L (ref 101–111)
GFR calc Af Amer: >60 mL/min (ref >60)
Glucose, Bld: 91 mg/dL (ref 65–99)
Potassium: 3.8 mmol/L (ref 3.5–5.1)
Sodium: 138 mmol/L (ref 135–145)
TOTAL PROTEIN: 7.3 g/dL (ref 6.5–8.1)

## 2017-04-08 LAB — CBC WITH DIFFERENTIAL/PLATELET
Basophils Absolute: 0 10*3/uL (ref 0.0–0.1)
Basophils Relative: 1 %
EOS ABS: 0.2 10*3/uL (ref 0.0–0.7)
EOS PCT: 2 %
HCT: 37.5 % — ABNORMAL LOW (ref 39.0–52.0)
Hemoglobin: 12 g/dL — ABNORMAL LOW (ref 13.0–17.0)
Lymphocytes Relative: 17 %
Lymphs Abs: 1.2 10*3/uL (ref 0.7–4.0)
MCH: 27.1 pg (ref 26.0–34.0)
MCHC: 32 g/dL (ref 30.0–36.0)
MCV: 84.7 fL (ref 78.0–100.0)
Monocytes Absolute: 0.7 10*3/uL (ref 0.1–1.0)
Monocytes Relative: 10 %
Neutro Abs: 4.9 10*3/uL (ref 1.7–7.7)
Neutrophils Relative %: 71 %
PLATELETS: 284 10*3/uL (ref 150–400)
RBC: 4.43 MIL/uL (ref 4.22–5.81)
RDW: 13.7 % (ref 11.5–15.5)
WBC: 7 10*3/uL (ref 4.0–10.5)

## 2017-04-08 LAB — PROTIME-INR
INR: 0.97
Prothrombin Time: 12.8 seconds (ref 11.4–15.2)

## 2017-04-08 LAB — POC OCCULT BLOOD, ED: FECAL OCCULT BLD: POSITIVE — AB

## 2017-04-08 MED ORDER — SODIUM CHLORIDE 0.9 % IV BOLUS
500.0000 mL | Freq: Once | INTRAVENOUS | Status: AC
  Administered 2017-04-08: 500 mL via INTRAVENOUS

## 2017-04-08 NOTE — Progress Notes (Signed)
[name] Consult  Reason for Consult: Bleeding s/p hemorrhoids  Referring Physician:  Dr.Long   Chief Complaint    Rectal Bleeding      Brett Matthews is a 84 y.o. male.  HPI: Brett Matthews is a 84 yo with a history of recent hemorrhoidectomy who presented to the ED with complaints of black dark blood from his rectum and some bright red blood since his surgery Monday with Dr. Arnoldo Morale.  He otherwise has been well and has used minimal pain medications. He has had soft BMs, daily and denies a hard BM. He is on Xarelto for A fib, but has not started that back yet due to his bleeding.   He came to the Ed after inability to get in touch with the office per his report.   Past Medical History:  Diagnosis Date  . Anemia, iron deficiency    Negative capsule endoscopy  . Arthritis   . Coronary atherosclerosis of native coronary artery    60 % circumflex stenosis; EF 65%, Cath 6/13  . Diverticula of colon    Pancolonic  . DM (diabetes mellitus), type 2, uncontrolled ([place])   . Dyspnea    Normal cardiopulmonary function test in July 2008, negative echocardiogram with "bubble" study for inter-cardiac shunt.  . Essential hypertension, benign   . Gastroesophageal reflux disease   . Hemorrhoids   . Hyperplastic colon polyp 04/06/2007  . Hypothyroidism 2003   Status post total thyroidectomy  . Migraines   . Neoplasm of lymphatic and hematopoietic tissue   . Paroxysmal atrial fibrillation ([place])    Diagnosed 2005  . Skin cancer     Past Surgical History:  Procedure Laterality Date  . BACK SURGERY    . BONE MARROW BIOPSY  1990's  . CATARACT EXTRACTION Bilateral   . COLONOSCOPY  04/06/2007   Dr. Gala Romney- Normal rectum with scattered pancolonic diverticula and slightly redundant elongated colon, diminutive polpy midsigmoid, remainder of colonic mucosa appeared normal. bx= hyperplastic polyp  . COLONOSCOPY N/A 11/29/2012   XBL:[id number] preparation. Friable anal canal/internal  hemorrhoids; otherwise, normal rectum. Normal-appearing colonic mucosa  . COLONOSCOPY WITH PROPOFOL N/A 03/12/2017   Procedure: COLONOSCOPY WITH PROPOFOL;  Surgeon: Daneil Dolin, MD;  Location: [facility];  Service: Endoscopy;  Laterality: N/A;  7:30am  . ESOPHAGOGASTRODUODENOSCOPY  04/06/2007   Dr. Gala Romney- normal esophagus s/p nissen fundoplication, intact nissen wrap o/w normal stomach  . ESOPHAGOGASTRODUODENOSCOPY N/A 11/29/2012   RMR: Abnormall distal esophagus bx c/w GERD. prior fundoplication. Gastric polyps  -bx benign. Status post biopsy of normal--appearing duodenal mucosa (bx neg)  . HEMORRHOID SURGERY N/A 04/06/2017   Procedure: EXTENSIVE HEMORRHOIDECTOMY;  Surgeon: Aviva Signs, MD;  Location: [facility] ORS;  Service: General;  Laterality: N/A;  . LEFT HEART CATHETERIZATION WITH CORONARY ANGIOGRAM N/A 06/27/2011   Procedure: LEFT HEART CATHETERIZATION WITH CORONARY ANGIOGRAM;  Surgeon: Thayer Headings, MD;  Location: [facility] CATH LAB;  Service: Cardiovascular;  Laterality: N/A;  . NISSEN FUNDOPLICATION    . POLYPECTOMY  03/12/2017   Procedure: POLYPECTOMY;  Surgeon: Daneil Dolin, MD;  Location: [facility];  Service: Endoscopy;;  cecal x3  . POSTERIOR LAMINECTOMY / DECOMPRESSION LUMBAR SPINE  ~ 1990's  . SKIN CANCER EXCISION     "off my back and chest; from sun"    Family History  Problem Relation Age of Onset  . Cancer Unknown   . Stroke Unknown   . Colon cancer Neg Hx     Social History  Tobacco Use  . Smoking status: Never Smoker  . Smokeless tobacco: Never Used  Substance Use Topics  . Alcohol use: No  . Drug use: No    Medications: I have reviewed the patient's current medications. No current facility-administered medications for this encounter.    Current Outpatient Medications  Medication Sig Dispense Refill Last Dose  . amLODipine (NORVASC) 10 MG tablet Take 10 mg by mouth daily.     04/07/2017 at 2200  . atorvastatin (LIPITOR) 40 MG tablet Take 40 mg by mouth  daily.   04/07/2017 at 2200  . Azelastine HCl 0.15 % SOLN Place 1 spray into the nose as needed.   Past Week at Unknown time  . benazepril (LOTENSIN) 5 MG tablet Take 5 mg by mouth daily.   04/08/2017 at 0800  . carvedilol (COREG) 12.5 MG tablet carvedilol 12.5 mg tablet daily   04/08/2017 at 0800  . citalopram (CELEXA) 20 MG tablet Take 1 tablet by mouth daily.  3 04/08/2017 at 0800  . clonazePAM (KLONOPIN) 0.5 MG tablet Take 1 tablet by mouth at bedtime.  3 04/07/2017 at Unknown time  . DYMISTA 137-50 MCG/ACT SUSP Place 1 spray into the nose daily.    04/08/2017 at Unknown time  . folic acid (FOLVITE) 1 MG tablet Take 1 tablet by mouth daily.  2 04/07/2017 at 2200  . glimepiride (AMARYL) 2 MG tablet Take 2 mg by mouth 2 (two) times daily.  9 04/08/2017 at 0800  . HYDROcodone-acetaminophen (NORCO) 5-325 MG tablet Take 1 tablet by mouth every 6 (six) hours as needed for moderate pain. 25 tablet 0 04/07/2017 at Unknown time  . levothyroxine (SYNTHROID, LEVOTHROID) 125 MCG tablet Take 125 mcg by mouth daily before breakfast.   04/08/2017 at 0800  . metFORMIN (GLUCOPHAGE) 500 MG tablet Take 1 tablet (500 mg total) by mouth 2 (two) times daily with a meal. HOLD 48 hours, restart on 06/30/2011.   04/08/2017 at 0800  . metoprolol tartrate (LOPRESSOR) 25 MG tablet take 1 tablet by mouth twice a day 180 tablet 3 04/08/2017 at 0800  . nitroGLYCERIN (NITROSTAT) 0.4 MG SL tablet Place 0.4 mg under the tongue every 5 (five) minutes as needed for chest pain.     . pantoprazole (PROTONIX) 40 MG tablet Take 40 mg by mouth daily.     04/07/2017 at 2200  . potassium chloride (MICRO-K) 10 MEQ CR capsule Take 1 capsule by mouth daily.   04/07/2017 at 2200  . tamsulosin (FLOMAX) 0.4 MG CAPS capsule Take 0.4 mg by mouth daily after supper.    04/07/2017 at Unknown time  . diclofenac (VOLTAREN) 75 MG EC tablet Take 1 tablet by mouth daily.    Not Taking at Unknown time  . rivaroxaban (XARELTO) 20 MG TABS tablet Take 20 mg by mouth daily with supper.    Not Taking at Unknown time    Allergies: No Known Allergies  ROS:  A comprehensive review of systems was negative except for: Gastrointestinal: positive for dark and bright blood per rectum  Blood pressure (!) 151/91, pulse 80, temperature 98.2 F (36.8 C), temperature source Oral, resp. rate 18, height _0  (1.778 m), weight 204 lb (92.5 kg), SpO2 97 %. Physical Exam  Constitutional: He is oriented to person, place, and time and well-developed, well-nourished, and in no distress.  HENT:  Head: Normocephalic.  Eyes: Pupils are equal, round, and reactive to light.  Neck: Normal range of motion.  Cardiovascular: Normal rate.  Pulmonary/Chest: Effort normal.  Abdominal: Soft. He exhibits no distension. There is no tenderness.  Genitourinary:  Genitourinary Comments: Nontender, some bruising around the perianal area, no gross blood noted, DRE deferred due to recent surgery/ trauma   Musculoskeletal: Normal range of motion.  Neurological: He is alert and oriented to person, place, and time.  Skin: Skin is warm and dry.  Psychiatric: Mood, memory, affect and judgment normal.  Vitals reviewed.   Results: Results for orders placed or performed during the hospital encounter of 04/08/17 (from the past 48 hour(s))  Comprehensive metabolic panel     Status: None   Collection Time: 04/08/17  1:06 PM  Result Value Ref Range   Sodium 138 135 - 145 mmol/L   Potassium 3.8 3.5 - 5.1 mmol/L   Chloride 105 101 - 111 mmol/L   CO2 24 22 - 32 mmol/L   Glucose, Bld 91 65 - 99 mg/dL   BUN 8 6 - 20 mg/dL   Creatinine, Ser 0.80 0.61 - 1.24 mg/dL   Calcium 9.8 8.9 - 10.3 mg/dL   Total Protein 7.3 6.5 - 8.1 g/dL   Albumin 3.9 3.5 - 5.0 g/dL   AST 17 15 - 41 U/L   ALT 17 17 - 63 U/L   Alkaline Phosphatase 86 38 - 126 U/L   Total Bilirubin 0.5 0.3 - 1.2 mg/dL   GFR calc non Af Amer >60 >60 mL/min   GFR calc Af Amer >60 >60 mL/min    Comment: (NOTE) The eGFR has been calculated using the CKD  EPI equation. This calculation has not been validated in all clinical situations. eGFR's persistently <60 mL/min signify possible Chronic Kidney Disease.    Anion gap 9 5 - 15    Comment: Performed at [facility], [address], [place], [place] [address]  CBC with Differential     Status: Abnormal   Collection Time: 04/08/17  1:06 PM  Result Value Ref Range   WBC 7.0 4.0 - 10.5 K/uL   RBC 4.43 4.22 - 5.81 MIL/uL   Hemoglobin 12.0 (L) 13.0 - 17.0 g/dL   HCT 37.5 (L) 39.0 - 52.0 %   MCV 84.7 78.0 - 100.0 fL   MCH 27.1 26.0 - 34.0 pg   MCHC 32.0 30.0 - 36.0 g/dL   RDW 13.7 11.5 - 15.5 %   Platelets 284 150 - 400 K/uL   Neutrophils Relative % 71 %   Neutro Abs 4.9 1.7 - 7.7 K/uL   Lymphocytes Relative 17 %   Lymphs Abs 1.2 0.7 - 4.0 K/uL   Monocytes Relative 10 %   Monocytes Absolute 0.7 0.1 - 1.0 K/uL   Eosinophils Relative 2 %   Eosinophils Absolute 0.2 0.0 - 0.7 K/uL   Basophils Relative 1 %   Basophils Absolute 0.0 0.0 - 0.1 K/uL    Comment: Performed at [facility], [address], [place], [place] [address]  Protime-INR     Status: None   Collection Time: 04/08/17  1:06 PM  Result Value Ref Range   Prothrombin Time 12.8 11.4 - 15.2 seconds   INR 0.97     Comment: Performed at [facility], [address] [place], [place] [address]  POC occult blood, ED     Status: Abnormal   Collection Time: 04/08/17  1:09 PM  Result Value Ref Range   Fecal Occult Bld POSITIVE (A) NEGATIVE    Assessment & Plan:  Brett Matthews is a 84 y.o. male with bleeding from his post hemorrhoidectomy sites.  Hgb remains stable at 12. He is otherwise fine.  - Reassured patient this is self limited.  - Follow up Tuesday as scheduled  Future Appointments  Date Time Provider [place]  04/14/2017  1:45 PM Aviva Signs, MD RS-RS None  06/30/2017  9:30 AM Carlis Stable, NP RGA-RGA RGA   -Continue to hold Xarelto until follow up with Dr. Arnoldo Morale   All questions were answered to the  satisfaction of the patient.  Virl Cagey 04/08/2017, 3:42 PM

## 2017-04-08 NOTE — ED Triage Notes (Signed)
Pt had Hemorid surgery on Monday by Dr Arnoldo Morale.  Pt states having having rectal bleeding on Tuesday morning with lower abdominal pain.  Denies dizziness.  Pt called Arnoldo Morale with no answer.

## 2017-04-08 NOTE — Discharge Instructions (Signed)
You were seen in the ED today with bleeding from the rectum. Your blood counts today are normal for you. Call Dr. Arnoldo Morale tomorrow to arrange a follow up appointment and return to the ED with any new or worsening symptoms.

## 2017-04-08 NOTE — ED Provider Notes (Signed)
Emergency Department Provider Note   I have reviewed the triage vital signs and the nursing notes.   HISTORY  Chief Complaint Rectal Bleeding   HPI Brett Matthews is a 84 y.o. male with PMH of CAD, DM, HTN, and PAF on Xarelto now POD 2 s/p hemorrhoidectomy with Dr. Arnoldo Morale presents to the emergency department with continued and worsening rectal bleeding.  The patient had bleeding with his hemorrhoids prior to surgery but states that since the procedure his bleeding has increased.  Prior to the procedure his rectal bleeding was only bright red but now is black with dark red.  He is having some mild to moderate lower abdominal discomfort as well.  No fevers or chills.  He has not restarted his Xarelto after holding it for his procedure due to continued bleeding.  Denies any vomiting or hematemesis.  He denies any generalized weakness, lightheadedness, shortness of breath, or CP.    Past Medical History:  Diagnosis Date  . Anemia, iron deficiency    Negative capsule endoscopy  . Arthritis   . Coronary atherosclerosis of native coronary artery    60 % circumflex stenosis; EF 65%, Cath 6/13  . Diverticula of colon    Pancolonic  . DM (diabetes mellitus), type 2, uncontrolled ([place])   . Dyspnea    Normal cardiopulmonary function test in July 2008, negative echocardiogram with "bubble" study for inter-cardiac shunt.  . Essential hypertension, benign   . Gastroesophageal reflux disease   . Hemorrhoids   . Hyperplastic colon polyp 04/06/2007  . Hypothyroidism 2003   Status post total thyroidectomy  . Migraines   . Neoplasm of lymphatic and hematopoietic tissue   . Paroxysmal atrial fibrillation ([place])    Diagnosed 2005  . Skin cancer     Patient Active Problem List   Diagnosis Date Noted  . H/O hemorrhoidectomy   . Bleeding hemorrhoids 03/24/2017  . Abdominal pain 03/02/2017  . Anemia 03/02/2017  . Rectal bleeding 03/17/2013  . Dyslipidemia 07/24/2010  . CAD, NATIVE VESSEL  09/26/2008  . HYPOTHYROIDISM 08/06/2007  . DIABETES MELLITUS, TYPE II, UNCONTROLLED 08/06/2007  . Essential hypertension, benign 08/06/2007  . GASTROESOPHAGEAL REFLUX DISEASE, CHRONIC 08/06/2007  . Paroxysmal atrial fibrillation ([place]) 08/06/2007    Past Surgical History:  Procedure Laterality Date  . BACK SURGERY    . BONE MARROW BIOPSY  1990's  . CATARACT EXTRACTION Bilateral   . COLONOSCOPY  04/06/2007   Dr. Gala Romney- Normal rectum with scattered pancolonic diverticula and slightly redundant elongated colon, diminutive polpy midsigmoid, remainder of colonic mucosa appeared normal. bx= hyperplastic polyp  . COLONOSCOPY N/A 11/29/2012   UEA:[id number] preparation. Friable anal canal/internal hemorrhoids; otherwise, normal rectum. Normal-appearing colonic mucosa  . COLONOSCOPY WITH PROPOFOL N/A 03/12/2017   Procedure: COLONOSCOPY WITH PROPOFOL;  Surgeon: Daneil Dolin, MD;  [facility];  Service: Endoscopy;  Laterality: N/A;  7:30am  . ESOPHAGOGASTRODUODENOSCOPY  04/06/2007   Dr. Gala Romney- normal esophagus s/p nissen fundoplication, intact nissen wrap o/w normal stomach  . ESOPHAGOGASTRODUODENOSCOPY N/A 11/29/2012   RMR: Abnormall distal esophagus bx c/w GERD. prior fundoplication. Gastric polyps  -bx benign. Status post biopsy of normal--appearing duodenal mucosa (bx neg)  . HEMORRHOID SURGERY N/A 04/06/2017   Procedure: EXTENSIVE HEMORRHOIDECTOMY;  Surgeon: Aviva Signs, MD;  Location: [facility] ORS;  Service: General;  Laterality: N/A;  . LEFT HEART CATHETERIZATION WITH CORONARY ANGIOGRAM N/A 06/27/2011   Procedure: LEFT HEART CATHETERIZATION WITH CORONARY ANGIOGRAM;  Surgeon: Thayer Headings, MD;  Location: [facility] CATH LAB;  Service: Cardiovascular;  Laterality: N/A;  . NISSEN FUNDOPLICATION    . POLYPECTOMY  03/12/2017   Procedure: POLYPECTOMY;  Surgeon: Daneil Dolin, MD;  Location: [facility];  Service: Endoscopy;;  cecal x3  . POSTERIOR LAMINECTOMY / DECOMPRESSION LUMBAR SPINE  ~ 1990's    . SKIN CANCER EXCISION     "off my back and chest; from sun"    Current Outpatient Rx  . Order #: [id number] Class: Historical Med  . Order #: [id number] Class: Historical Med  . Order #: [id number] Class: Historical Med  . Order #: [id number] Class: Historical Med  . Order #: [id number] Class: Historical Med  . Order #: [id number] Class: Historical Med  . Order #: [id number] Class: Historical Med  . Order #: [id number] Class: Historical Med  . Order #: [id number] Class: Historical Med  . Order #: [id number] Class: Historical Med  . Order #: [id number] Class: Normal  . Order #: [id number] Class: Historical Med  . Order #: [id number] Class: OTC  . Order #: [id number] Class: Normal  . Order #: [id number] Class: Historical Med  . Order #: [id number] Class: Historical Med  . Order #: [id number] Class: Historical Med  . Order #: [id number] Class: Historical Med  . Order #: [id number] Class: Historical Med  . Order #: [id number] Class: Historical Med    Allergies Patient has no known allergies.  Family History  Problem Relation Age of Onset  . Cancer Unknown   . Stroke Unknown   . Colon cancer Neg Hx     Social History Social History   Tobacco Use  . Smoking status: Never Smoker  . Smokeless tobacco: Never Used  Substance Use Topics  . Alcohol use: No  . Drug use: No    Review of Systems  Constitutional: No fever/chills Eyes: No visual changes. ENT: No sore throat. Cardiovascular: Denies chest pain. Respiratory: Denies shortness of breath. Gastrointestinal: No abdominal pain.  No nausea, no vomiting.  No diarrhea.  No constipation. Positive blood per rectum.  Genitourinary: Negative for dysuria. Musculoskeletal: Negative for back pain. Skin: Negative for rash. Neurological: Negative for headaches, focal weakness or numbness.  10-point ROS otherwise negative.  ____________________________________________   PHYSICAL EXAM:  VITAL SIGNS: ED Triage Vitals  Enc Vitals Group     BP 04/08/17 1239 (!) 159/91      Pulse Rate 04/08/17 1239 79     Resp 04/08/17 1239 18     Temp 04/08/17 1239 98.2 F (36.8 C)     Temp Source 04/08/17 1239 Oral     SpO2 04/08/17 1239 99 %     Weight 04/08/17 1240 204 lb (92.5 kg)     Height 04/08/17 1240 '5\' 10"'$  (1.778 m)     Pain Score 04/08/17 1240 3   Constitutional: Alert and oriented. Well appearing and in no acute distress. Eyes: Conjunctivae are normal. Head: Atraumatic. Nose: No congestion/rhinnorhea. Mouth/Throat: Mucous membranes are moist.  Neck: No stridor.  Cardiovascular: Normal rate, regular rhythm. Good peripheral circulation. Grossly normal heart sounds.   Respiratory: Normal respiratory effort.  No retractions. Lungs CTAB. Gastrointestinal: Soft and nontender. No distention. Rectal exam performed with nurse chaperone. BRB on exam finger. No external hemorrhoids visible. Mild pain with DRE. No melena appreciated.  Musculoskeletal: No lower extremity tenderness nor edema. No gross deformities of extremities. Neurologic:  Normal speech and language. No gross focal neurologic deficits are appreciated.  Skin:  Skin is warm, dry and intact. No rash noted.  ____________________________________________   LABS (all labs ordered are listed, but only abnormal results are displayed)  Labs Reviewed  CBC WITH DIFFERENTIAL/PLATELET -  Abnormal; Notable for the following components:      Result Value   Hemoglobin 12.0 (*)    HCT 37.5 (*)    All other components within normal limits  POC OCCULT BLOOD, ED - Abnormal; Notable for the following components:   Fecal Occult Bld POSITIVE (*)    All other components within normal limits  COMPREHENSIVE METABOLIC PANEL  PROTIME-INR   ____________________________________________  RADIOLOGY  None ____________________________________________   PROCEDURES  Procedure(s) performed:   Procedures  None ____________________________________________   INITIAL IMPRESSION / ASSESSMENT AND PLAN / ED  COURSE  Pertinent labs & imaging results that were available during my care of the patient were reviewed by me and considered in my medical decision making (see chart for details).  Patient presents to the emergency department for evaluation of continued and worsening rectal bleeding after extensive hemorrhoidectomy with Dr. Arnoldo Morale on 04/06/2017.  The patient denies any symptoms to suggest worsening anemia.  He does have bright red blood on rectal exam without melena.  No visible hemorrhoids.  No evidence of developing abscess.  The patient has not restarted his Xarelto due to continued bleeding. Plan for labs and discuss with general surgery once labs result. No hemodynamic instability at this time.   02:30 PM Spoke with Dr. Constance Haw regarding the patient and labs. She will be down to evaluate in the ED.   Spoke with Dr. Constance Haw after evaluation. She has arranged outpatient f/u appointment for next week with Dr. Arnoldo Morale. Discussed ED return precautions in detail.   At this time, I do not feel there is any life-threatening condition present. I have reviewed and discussed all results (EKG, imaging, lab, urine as appropriate), exam findings with patient. I have reviewed nursing notes and appropriate previous records.  I feel the patient is safe to be discharged home without further emergent workup. Discussed usual and customary return precautions. Patient and family (if present) verbalize understanding and are comfortable with this plan.  Patient will follow-up with their primary care provider. If they do not have a primary care provider, information for follow-up has been provided to them. All questions have been answered.  ____________________________________________  FINAL CLINICAL IMPRESSION(S) / ED DIAGNOSES  Final diagnoses:  Rectal bleeding    MEDICATIONS GIVEN DURING THIS VISIT:  Medications  sodium chloride 0.9 % bolus 500 mL (0 mLs Intravenous Stopped 04/08/17 1455)    Note:  This  document was prepared using Dragon voice recognition software and may include unintentional dictation errors.  Nanda Quinton, MD Emergency Medicine    Laneah Luft, [facility], MD 04/08/17 [phone]

## 2017-04-09 ENCOUNTER — Ambulatory Visit: Payer: PPO | Admitting: General Surgery

## 2017-04-14 ENCOUNTER — Ambulatory Visit (INDEPENDENT_AMBULATORY_CARE_PROVIDER_SITE_OTHER): Payer: Self-pay | Admitting: General Surgery

## 2017-04-14 ENCOUNTER — Encounter: Payer: Self-pay | Admitting: General Surgery

## 2017-04-14 VITALS — BP 149/79 | HR 52 | Temp 98.7°F | Ht 70.0 in | Wt 204.0 lb

## 2017-04-14 DIAGNOSIS — Z09 Encounter for follow-up examination after completed treatment for conditions other than malignant neoplasm: Secondary | ICD-10-CM

## 2017-04-14 DIAGNOSIS — M170 Bilateral primary osteoarthritis of knee: Secondary | ICD-10-CM | POA: Diagnosis not present

## 2017-04-14 DIAGNOSIS — M25511 Pain in right shoulder: Secondary | ICD-10-CM | POA: Diagnosis not present

## 2017-04-14 DIAGNOSIS — M67919 Unspecified disorder of synovium and tendon, unspecified shoulder: Secondary | ICD-10-CM | POA: Diagnosis not present

## 2017-04-14 DIAGNOSIS — M719 Bursopathy, unspecified: Secondary | ICD-10-CM | POA: Diagnosis not present

## 2017-04-14 NOTE — Progress Notes (Signed)
Subjective:     Brett Matthews  Status post extensive hemorrhoidectomy.  Patient did have some bleeding per rectum after the surgery, but that has significantly decreased.  He has started his anticoagulation.  He did not have any bleeding today.  He is pleased with results.  He has no rectal pain. Objective:    BP (!) 149/79   Pulse (!) 52   Temp 98.7 F (37.1 C)   Ht 5\' 10"  (1.778 m)   Wt 204 lb (92.5 kg)   BMI 29.27 kg/m   General:  alert, cooperative and no distress  Rectum healing well.  No active bleeding noted. Final pathology consistent with diagnosis.     Assessment:    Doing well postoperatively.    Plan:   Follow-up here as needed.

## 2017-04-27 ENCOUNTER — Ambulatory Visit (INDEPENDENT_AMBULATORY_CARE_PROVIDER_SITE_OTHER): Payer: PPO | Admitting: Otolaryngology

## 2017-04-27 DIAGNOSIS — J310 Chronic rhinitis: Secondary | ICD-10-CM

## 2017-04-27 DIAGNOSIS — H6123 Impacted cerumen, bilateral: Secondary | ICD-10-CM

## 2017-04-27 DIAGNOSIS — H900 Conductive hearing loss, bilateral: Secondary | ICD-10-CM

## 2017-04-29 DIAGNOSIS — M25411 Effusion, right shoulder: Secondary | ICD-10-CM | POA: Diagnosis not present

## 2017-04-29 DIAGNOSIS — M89311 Hypertrophy of bone, right shoulder: Secondary | ICD-10-CM | POA: Diagnosis not present

## 2017-04-29 DIAGNOSIS — M75121 Complete rotator cuff tear or rupture of right shoulder, not specified as traumatic: Secondary | ICD-10-CM | POA: Diagnosis not present

## 2017-04-29 DIAGNOSIS — M25711 Osteophyte, right shoulder: Secondary | ICD-10-CM | POA: Diagnosis not present

## 2017-04-29 DIAGNOSIS — M24111 Other articular cartilage disorders, right shoulder: Secondary | ICD-10-CM | POA: Diagnosis not present

## 2017-04-29 DIAGNOSIS — M67811 Other specified disorders of synovium, right shoulder: Secondary | ICD-10-CM | POA: Diagnosis not present

## 2017-04-29 DIAGNOSIS — M7581 Other shoulder lesions, right shoulder: Secondary | ICD-10-CM | POA: Diagnosis not present

## 2017-05-05 ENCOUNTER — Ambulatory Visit: Payer: PPO | Admitting: Nurse Practitioner

## 2017-05-06 DIAGNOSIS — M1712 Unilateral primary osteoarthritis, left knee: Secondary | ICD-10-CM | POA: Diagnosis not present

## 2017-05-06 DIAGNOSIS — M25561 Pain in right knee: Secondary | ICD-10-CM | POA: Diagnosis not present

## 2017-05-06 DIAGNOSIS — M67919 Unspecified disorder of synovium and tendon, unspecified shoulder: Secondary | ICD-10-CM | POA: Diagnosis not present

## 2017-05-06 DIAGNOSIS — M170 Bilateral primary osteoarthritis of knee: Secondary | ICD-10-CM | POA: Diagnosis not present

## 2017-05-06 DIAGNOSIS — M719 Bursopathy, unspecified: Secondary | ICD-10-CM | POA: Diagnosis not present

## 2017-05-06 DIAGNOSIS — M25562 Pain in left knee: Secondary | ICD-10-CM | POA: Diagnosis not present

## 2017-06-15 DIAGNOSIS — M792 Neuralgia and neuritis, unspecified: Secondary | ICD-10-CM | POA: Diagnosis not present

## 2017-06-15 DIAGNOSIS — J3489 Other specified disorders of nose and nasal sinuses: Secondary | ICD-10-CM | POA: Diagnosis not present

## 2017-06-15 DIAGNOSIS — G501 Atypical facial pain: Secondary | ICD-10-CM | POA: Diagnosis not present

## 2017-06-23 DIAGNOSIS — G4700 Insomnia, unspecified: Secondary | ICD-10-CM | POA: Diagnosis not present

## 2017-06-23 DIAGNOSIS — Z299 Encounter for prophylactic measures, unspecified: Secondary | ICD-10-CM | POA: Diagnosis not present

## 2017-06-23 DIAGNOSIS — E1165 Type 2 diabetes mellitus with hyperglycemia: Secondary | ICD-10-CM | POA: Diagnosis not present

## 2017-06-23 DIAGNOSIS — I10 Essential (primary) hypertension: Secondary | ICD-10-CM | POA: Diagnosis not present

## 2017-06-23 DIAGNOSIS — M159 Polyosteoarthritis, unspecified: Secondary | ICD-10-CM | POA: Diagnosis not present

## 2017-06-23 DIAGNOSIS — Z6830 Body mass index (BMI) 30.0-30.9, adult: Secondary | ICD-10-CM | POA: Diagnosis not present

## 2017-06-23 DIAGNOSIS — I4891 Unspecified atrial fibrillation: Secondary | ICD-10-CM | POA: Diagnosis not present

## 2017-06-23 DIAGNOSIS — I8390 Asymptomatic varicose veins of unspecified lower extremity: Secondary | ICD-10-CM | POA: Diagnosis not present

## 2017-06-30 ENCOUNTER — Ambulatory Visit: Payer: PPO | Admitting: Nurse Practitioner

## 2017-06-30 ENCOUNTER — Telehealth: Payer: Self-pay | Admitting: Internal Medicine

## 2017-06-30 ENCOUNTER — Encounter: Payer: Self-pay | Admitting: Internal Medicine

## 2017-06-30 NOTE — Telephone Encounter (Signed)
PATIENT WAS A NO SHOW AND LETTER SENT  °

## 2017-07-02 DIAGNOSIS — H40013 Open angle with borderline findings, low risk, bilateral: Secondary | ICD-10-CM | POA: Diagnosis not present

## 2017-07-02 DIAGNOSIS — E119 Type 2 diabetes mellitus without complications: Secondary | ICD-10-CM | POA: Diagnosis not present

## 2017-07-02 DIAGNOSIS — H353132 Nonexudative age-related macular degeneration, bilateral, intermediate dry stage: Secondary | ICD-10-CM | POA: Diagnosis not present

## 2017-07-02 DIAGNOSIS — Z961 Presence of intraocular lens: Secondary | ICD-10-CM | POA: Diagnosis not present

## 2017-07-10 DIAGNOSIS — I10 Essential (primary) hypertension: Secondary | ICD-10-CM | POA: Diagnosis not present

## 2017-07-10 DIAGNOSIS — Z6830 Body mass index (BMI) 30.0-30.9, adult: Secondary | ICD-10-CM | POA: Diagnosis not present

## 2017-07-10 DIAGNOSIS — Z713 Dietary counseling and surveillance: Secondary | ICD-10-CM | POA: Diagnosis not present

## 2017-07-10 DIAGNOSIS — Z299 Encounter for prophylactic measures, unspecified: Secondary | ICD-10-CM | POA: Diagnosis not present

## 2017-07-10 DIAGNOSIS — M25511 Pain in right shoulder: Secondary | ICD-10-CM | POA: Diagnosis not present

## 2017-07-20 DIAGNOSIS — M170 Bilateral primary osteoarthritis of knee: Secondary | ICD-10-CM | POA: Diagnosis not present

## 2017-07-20 DIAGNOSIS — M75121 Complete rotator cuff tear or rupture of right shoulder, not specified as traumatic: Secondary | ICD-10-CM | POA: Diagnosis not present

## 2017-08-04 DIAGNOSIS — H6121 Impacted cerumen, right ear: Secondary | ICD-10-CM | POA: Diagnosis not present

## 2017-08-04 DIAGNOSIS — H903 Sensorineural hearing loss, bilateral: Secondary | ICD-10-CM | POA: Diagnosis not present

## 2017-08-05 ENCOUNTER — Telehealth: Payer: Self-pay | Admitting: Cardiology

## 2017-08-05 NOTE — Telephone Encounter (Signed)
Pt says yesterday at opthmologist in [place], [place] (doesn't know the name only that was part of [facility]) HR was 41 and "blood count was low" and was told to contact our office - pt says he has been dizzy at times but denies any other symptoms. Thinks he may need medication adjustments and wanted to know if he should be seen sooner than 9/11 appt with Dr Domenic Polite - should he have nurse visit?

## 2017-08-05 NOTE — Telephone Encounter (Signed)
Patient states that he went and had his ears checked on 08-04-17 .States that the nurse checked his BP and told him that his HR was low.  Please call [phone].

## 2017-08-05 NOTE — Telephone Encounter (Signed)
Yes please schedule a nurse visit with ECG and review his medications.  His current list includes both Lopressor and Coreg, he should not be on both.

## 2017-08-05 NOTE — Telephone Encounter (Signed)
LM to return call.

## 2017-08-07 ENCOUNTER — Ambulatory Visit (INDEPENDENT_AMBULATORY_CARE_PROVIDER_SITE_OTHER): Payer: PPO | Admitting: *Deleted

## 2017-08-07 DIAGNOSIS — I480 Paroxysmal atrial fibrillation: Secondary | ICD-10-CM

## 2017-08-07 NOTE — Telephone Encounter (Signed)
Pt scheduled for today at 3pm nurse visit

## 2017-08-07 NOTE — Progress Notes (Signed)
Patient in office today for medication review & EKG per telephone note (Dr. Domenic Polite) on 08/05/2017.  Patient did bring in medication bottles - medication list updated.  6 mo f/u is already scheduled for 09/16/2017 with Dr. Domenic Polite.    EKG scanned into Epic.

## 2017-08-08 NOTE — Progress Notes (Addendum)
ECG has now been scanned.  It demonstrates sinus rhythm with PACs.  He takes carvedilol only.  He does not appear to be significantly symptomatic.  I would not make any adjustments at this time.  He already has scheduled follow-up with Dr. Domenic Polite in September.

## 2017-08-11 ENCOUNTER — Encounter: Payer: Self-pay | Admitting: *Deleted

## 2017-08-11 NOTE — Progress Notes (Signed)
Patient notified and verbalized understanding. 

## 2017-08-17 DIAGNOSIS — M75121 Complete rotator cuff tear or rupture of right shoulder, not specified as traumatic: Secondary | ICD-10-CM | POA: Diagnosis not present

## 2017-08-19 DIAGNOSIS — K219 Gastro-esophageal reflux disease without esophagitis: Secondary | ICD-10-CM | POA: Diagnosis not present

## 2017-08-19 DIAGNOSIS — F329 Major depressive disorder, single episode, unspecified: Secondary | ICD-10-CM | POA: Diagnosis not present

## 2017-08-19 DIAGNOSIS — I10 Essential (primary) hypertension: Secondary | ICD-10-CM | POA: Diagnosis not present

## 2017-08-19 DIAGNOSIS — M19011 Primary osteoarthritis, right shoulder: Secondary | ICD-10-CM | POA: Diagnosis not present

## 2017-08-19 DIAGNOSIS — E039 Hypothyroidism, unspecified: Secondary | ICD-10-CM | POA: Diagnosis not present

## 2017-08-19 DIAGNOSIS — M25811 Other specified joint disorders, right shoulder: Secondary | ICD-10-CM | POA: Diagnosis not present

## 2017-08-19 DIAGNOSIS — I4891 Unspecified atrial fibrillation: Secondary | ICD-10-CM | POA: Diagnosis not present

## 2017-08-19 DIAGNOSIS — E119 Type 2 diabetes mellitus without complications: Secondary | ICD-10-CM | POA: Diagnosis not present

## 2017-08-19 DIAGNOSIS — M7551 Bursitis of right shoulder: Secondary | ICD-10-CM | POA: Diagnosis not present

## 2017-08-19 DIAGNOSIS — M7521 Bicipital tendinitis, right shoulder: Secondary | ICD-10-CM | POA: Diagnosis not present

## 2017-08-19 DIAGNOSIS — Z809 Family history of malignant neoplasm, unspecified: Secondary | ICD-10-CM | POA: Diagnosis not present

## 2017-08-19 DIAGNOSIS — F419 Anxiety disorder, unspecified: Secondary | ICD-10-CM | POA: Diagnosis not present

## 2017-08-19 DIAGNOSIS — E785 Hyperlipidemia, unspecified: Secondary | ICD-10-CM | POA: Diagnosis not present

## 2017-08-19 DIAGNOSIS — M75121 Complete rotator cuff tear or rupture of right shoulder, not specified as traumatic: Secondary | ICD-10-CM | POA: Diagnosis not present

## 2017-08-19 DIAGNOSIS — Z823 Family history of stroke: Secondary | ICD-10-CM | POA: Diagnosis not present

## 2017-08-28 DIAGNOSIS — M75121 Complete rotator cuff tear or rupture of right shoulder, not specified as traumatic: Secondary | ICD-10-CM | POA: Diagnosis not present

## 2017-08-28 DIAGNOSIS — M7551 Bursitis of right shoulder: Secondary | ICD-10-CM | POA: Diagnosis not present

## 2017-08-28 DIAGNOSIS — M7541 Impingement syndrome of right shoulder: Secondary | ICD-10-CM | POA: Diagnosis not present

## 2017-08-28 DIAGNOSIS — M25811 Other specified joint disorders, right shoulder: Secondary | ICD-10-CM | POA: Diagnosis not present

## 2017-08-28 DIAGNOSIS — I4891 Unspecified atrial fibrillation: Secondary | ICD-10-CM | POA: Diagnosis not present

## 2017-08-28 DIAGNOSIS — G8918 Other acute postprocedural pain: Secondary | ICD-10-CM | POA: Diagnosis not present

## 2017-08-28 DIAGNOSIS — M7521 Bicipital tendinitis, right shoulder: Secondary | ICD-10-CM | POA: Diagnosis not present

## 2017-08-28 DIAGNOSIS — E119 Type 2 diabetes mellitus without complications: Secondary | ICD-10-CM | POA: Diagnosis not present

## 2017-08-28 DIAGNOSIS — M19011 Primary osteoarthritis, right shoulder: Secondary | ICD-10-CM | POA: Diagnosis not present

## 2017-09-09 DIAGNOSIS — M75122 Complete rotator cuff tear or rupture of left shoulder, not specified as traumatic: Secondary | ICD-10-CM | POA: Diagnosis not present

## 2017-09-15 ENCOUNTER — Encounter: Payer: Self-pay | Admitting: *Deleted

## 2017-09-16 ENCOUNTER — Ambulatory Visit (INDEPENDENT_AMBULATORY_CARE_PROVIDER_SITE_OTHER): Payer: PPO | Admitting: Cardiology

## 2017-09-16 ENCOUNTER — Encounter: Payer: Self-pay | Admitting: Cardiology

## 2017-09-16 ENCOUNTER — Encounter: Payer: Self-pay | Admitting: *Deleted

## 2017-09-16 VITALS — BP 118/62 | HR 73 | Ht 69.0 in | Wt 202.0 lb

## 2017-09-16 DIAGNOSIS — I2510 Atherosclerotic heart disease of native coronary artery without angina pectoris: Secondary | ICD-10-CM

## 2017-09-16 DIAGNOSIS — I10 Essential (primary) hypertension: Secondary | ICD-10-CM

## 2017-09-16 DIAGNOSIS — I480 Paroxysmal atrial fibrillation: Secondary | ICD-10-CM | POA: Diagnosis not present

## 2017-09-16 NOTE — Progress Notes (Signed)
Cardiology Office Note  Date: 09/16/2017   ID: Brett Matthews, DOB 15-Apr-1932, MRN [id number]  PCP: Glenda Chroman, MD  Primary Cardiologist: Rozann Lesches, MD   Chief Complaint  Patient presents with  . PAF    History of Present Illness: Brett Matthews is an 84 y.o. male last seen in March.  He is here for a routine visit.  He states that he recently had right shoulder surgery with Dr. Case, still wearing a sling.  He was temporarily off Xarelto for this.  He does not report any palpitations or chest pain.  As per chart notes, there was concern that he may have been bradycardic based on outside medical evaluations, however on assessment in our office his heart rate has been normal range with his most recent ECG showing sinus rhythm and PACs.  Went over his current medications.  He remains on Coreg for heart rate control and Xarelto for stroke prophylaxis.  He does not report any bleeding problems.  We are requesting his most recent lab work from Dr. Woody Seller.  Past Medical History:  Diagnosis Date  . Anemia, iron deficiency    Negative capsule endoscopy  . Arthritis   . Coronary atherosclerosis of native coronary artery    60 % circumflex stenosis; EF 65%, Cath 6/13  . Diverticula of colon    Pancolonic  . DM (diabetes mellitus), type 2, uncontrolled ([place])   . Dyspnea    Normal cardiopulmonary function test in July 2008, negative echocardiogram with "bubble" study for inter-cardiac shunt.  . Essential hypertension, benign   . Gastroesophageal reflux disease   . Hemorrhoids   . Hyperplastic colon polyp 04/06/2007  . Hypothyroidism 2003   Status post total thyroidectomy  . Migraines   . Neoplasm of lymphatic and hematopoietic tissue   . Paroxysmal atrial fibrillation ([place])    Diagnosed 2005  . Skin cancer     Past Surgical History:  Procedure Laterality Date  . BACK SURGERY    . BONE MARROW BIOPSY  1990's  . CATARACT EXTRACTION Bilateral   . COLONOSCOPY  04/06/2007   Dr. Gala Romney- Normal rectum with scattered pancolonic diverticula and slightly redundant elongated colon, diminutive polpy midsigmoid, remainder of colonic mucosa appeared normal. bx= hyperplastic polyp  . COLONOSCOPY N/A 11/29/2012   OMV:EHMCNOBS preparation. Friable anal canal/internal hemorrhoids; otherwise, normal rectum. Normal-appearing colonic mucosa  . COLONOSCOPY WITH PROPOFOL N/A 03/12/2017   Procedure: COLONOSCOPY WITH PROPOFOL;  Surgeon: Daneil Dolin, MD;  [facility];  Service: Endoscopy;  Laterality: N/A;  7:30am  . ESOPHAGOGASTRODUODENOSCOPY  04/06/2007   Dr. Gala Romney- normal esophagus s/p nissen fundoplication, intact nissen wrap o/w normal stomach  . ESOPHAGOGASTRODUODENOSCOPY N/A 11/29/2012   RMR: Abnormall distal esophagus bx c/w GERD. prior fundoplication. Gastric polyps  -bx benign. Status post biopsy of normal--appearing duodenal mucosa (bx neg)  . HEMORRHOID SURGERY N/A 04/06/2017   Procedure: EXTENSIVE HEMORRHOIDECTOMY;  Surgeon: Aviva Signs, MD;  Location: [facility] ORS;  Service: General;  Laterality: N/A;  . LEFT HEART CATHETERIZATION WITH CORONARY ANGIOGRAM N/A 06/27/2011   Procedure: LEFT HEART CATHETERIZATION WITH CORONARY ANGIOGRAM;  Surgeon: Thayer Headings, MD;  Location: [facility] CATH LAB;  Service: Cardiovascular;  Laterality: N/A;  . NISSEN FUNDOPLICATION    . POLYPECTOMY  03/12/2017   Procedure: POLYPECTOMY;  Surgeon: Daneil Dolin, MD;  [facility]: [facility];  Service: Endoscopy;;  cecal x3  . POSTERIOR LAMINECTOMY / DECOMPRESSION LUMBAR SPINE  ~ 1990's  . SKIN CANCER EXCISION     "  off my back and chest; from sun"    Current Outpatient Medications  Medication Sig Dispense Refill  . amLODipine (NORVASC) 10 MG tablet Take 10 mg by mouth daily.      . benazepril (LOTENSIN) 40 MG tablet Take 40 mg by mouth daily.    . Calcium Polycarbophil (FIBER-CAPS PO) Take 1 capsule by mouth 2 (two) times daily.    . carvedilol (COREG) 12.5 MG tablet Take 18.75 mg by mouth 2  (two) times daily with a meal.    . citalopram (CELEXA) 20 MG tablet Take 1 tablet by mouth daily.  3  . clonazePAM (KLONOPIN) 0.5 MG tablet Take 1 tablet by mouth 2 (two) times daily as needed.   3  . DYMISTA 137-50 MCG/ACT SUSP Place 1 spray into the nose daily.     . folic acid (FOLVITE) 1 MG tablet Take 1 tablet by mouth daily.  2  . gabapentin (NEURONTIN) 100 MG capsule Take 100 mg by mouth 2 (two) times daily. & 3 at bedtime    . glimepiride (AMARYL) 2 MG tablet Take 2 mg by mouth 2 (two) times daily.  9  . levothyroxine (SYNTHROID) 125 MCG tablet Take 125 mcg by mouth daily before breakfast. SYNTHROID - brand name only    . metFORMIN (GLUCOPHAGE) 500 MG tablet Take by mouth. Take 2 tabs (1,057m) by mouth every morning & 1 tab (5043m every evening    . neomycin-polymyxin-dexameth (MAXITROL) 0.1 % OINT Place 1 application into the right eye at bedtime.    . nitroGLYCERIN (NITROSTAT) 0.4 MG SL tablet Place 0.4 mg under the tongue every 5 (five) minutes as needed for chest pain.    . pantoprazole (PROTONIX) 40 MG tablet Take 40 mg by mouth daily.      . rivaroxaban (XARELTO) 20 MG TABS tablet Take 20 mg by mouth daily with supper.    . tamsulosin (FLOMAX) 0.4 MG CAPS capsule Take 0.4 mg by mouth daily after supper.     . traMADol (ULTRAM) 50 MG tablet Take 50 mg by mouth every 8 (eight) hours as needed.      No current facility-administered medications for this visit.    Allergies:  Patient has no known allergies.   Social History: The patient  reports that he has never smoked. He has never used smokeless tobacco. He reports that he does not drink alcohol or use drugs.   ROS:  Please see the history of present illness. Otherwise, complete review of systems is positive for right shoulder stiffness with.  All other systems are reviewed and negative.   Physical Exam: VS:  BP 118/62   Pulse 73   Ht _0  (1.753 m)   Wt 202 lb (91.6 kg)   SpO2 96%   BMI 29.83 kg/m , BMI Body mass index  is 29.83 kg/m.  Wt Readings from Last 3 Encounters:  09/16/17 202 lb (91.6 kg)  04/14/17 204 lb (92.5 kg)  04/08/17 204 lb (92.5 kg)    General: Patient appears comfortable at rest. HEENT: Conjunctiva and lids normal, oropharynx clear. Neck: Supple, no elevated JVP or carotid bruits, no thyromegaly. Lungs: Clear to auscultation, nonlabored breathing at rest. Cardiac: Regular rate and rhythm with ectopy, no S3 or significant systolic murmur, no pericardial rub. Abdomen: Soft, nontender, bowel sounds present. Extremities: No pitting edema, distal pulses 2+.  Wearing sling on the right. Skin: Warm and dry. Musculoskeletal: No kyphosis. Neuropsychiatric: Alert and oriented x3, affect grossly appropriate.  ECG: I personally  reviewed the tracing from 08/07/2017 which showed sinus rhythm with PACs, left anterior fascicular block, nonspecific ST changes.  Recent Labwork: 04/08/2017: ALT 17; AST 17; BUN 8; Creatinine, Ser 0.80; Hemoglobin 12.0; Platelets 284; Potassium 3.8; Sodium 138   Other Studies Reviewed Today:  Echocardiogram from [facility] 12/20/2012: Mild LVH with LVEF 75-91%, diastolic dysfunction, mild left atrial enlargement, upper normal RV chamber size, MAC with mild mitral regurgitation, sclerotic aortic valve with trace aortic regurgitation, mild tricuspid regurgitation with RVSP 25-35 mmHg.  Assessment and Plan:  1.  Paroxysmal atrial fibrillation.  Plan to continue current dose of Coreg as well as Xarelto for stroke prophylaxis.  Requesting most recent lab work from Dr. Woody Seller.  2.  History of moderate CAD involving the circumflex without active angina symptoms.  He is on beta-blocker and Norvasc.  Also Lipitor with follow-up per Dr. Woody Seller.  3.  Essential hypertension, blood pressure is well controlled today.  No changes made to current regimen.  Current medicines were reviewed with the patient today.  Disposition: Follow-up in 6 months.  Signed, Satira Sark, MD, [facility] 09/16/2017 4:12 PM    [facility] [place], [place], [place] [address] Phone: [phone]; Fax: [phone]

## 2017-09-16 NOTE — Patient Instructions (Addendum)

## 2017-09-21 DIAGNOSIS — M25511 Pain in right shoulder: Secondary | ICD-10-CM | POA: Diagnosis not present

## 2017-09-21 DIAGNOSIS — Z9889 Other specified postprocedural states: Secondary | ICD-10-CM | POA: Diagnosis not present

## 2017-09-29 DIAGNOSIS — Z299 Encounter for prophylactic measures, unspecified: Secondary | ICD-10-CM | POA: Diagnosis not present

## 2017-09-29 DIAGNOSIS — Z6829 Body mass index (BMI) 29.0-29.9, adult: Secondary | ICD-10-CM | POA: Diagnosis not present

## 2017-09-29 DIAGNOSIS — M25511 Pain in right shoulder: Secondary | ICD-10-CM | POA: Diagnosis not present

## 2017-09-29 DIAGNOSIS — E1165 Type 2 diabetes mellitus with hyperglycemia: Secondary | ICD-10-CM | POA: Diagnosis not present

## 2017-09-29 DIAGNOSIS — I10 Essential (primary) hypertension: Secondary | ICD-10-CM | POA: Diagnosis not present

## 2017-10-08 DIAGNOSIS — M25511 Pain in right shoulder: Secondary | ICD-10-CM | POA: Diagnosis not present

## 2017-10-08 DIAGNOSIS — Z9889 Other specified postprocedural states: Secondary | ICD-10-CM | POA: Diagnosis not present

## 2017-10-13 DIAGNOSIS — S6991XA Unspecified injury of right wrist, hand and finger(s), initial encounter: Secondary | ICD-10-CM | POA: Diagnosis not present

## 2017-10-13 DIAGNOSIS — I10 Essential (primary) hypertension: Secondary | ICD-10-CM | POA: Diagnosis not present

## 2017-10-13 DIAGNOSIS — E119 Type 2 diabetes mellitus without complications: Secondary | ICD-10-CM | POA: Diagnosis not present

## 2017-10-13 DIAGNOSIS — M25521 Pain in right elbow: Secondary | ICD-10-CM | POA: Diagnosis not present

## 2017-10-13 DIAGNOSIS — E039 Hypothyroidism, unspecified: Secondary | ICD-10-CM | POA: Diagnosis not present

## 2017-10-13 DIAGNOSIS — S4991XA Unspecified injury of right shoulder and upper arm, initial encounter: Secondary | ICD-10-CM | POA: Diagnosis not present

## 2017-10-13 DIAGNOSIS — M79641 Pain in right hand: Secondary | ICD-10-CM | POA: Diagnosis not present

## 2017-10-13 DIAGNOSIS — R51 Headache: Secondary | ICD-10-CM | POA: Diagnosis not present

## 2017-10-13 DIAGNOSIS — W19XXXA Unspecified fall, initial encounter: Secondary | ICD-10-CM | POA: Diagnosis not present

## 2017-10-13 DIAGNOSIS — S0093XA Contusion of unspecified part of head, initial encounter: Secondary | ICD-10-CM | POA: Diagnosis not present

## 2017-10-13 DIAGNOSIS — S79911A Unspecified injury of right hip, initial encounter: Secondary | ICD-10-CM | POA: Diagnosis not present

## 2017-10-13 DIAGNOSIS — S40011A Contusion of right shoulder, initial encounter: Secondary | ICD-10-CM | POA: Diagnosis not present

## 2017-10-13 DIAGNOSIS — M25511 Pain in right shoulder: Secondary | ICD-10-CM | POA: Diagnosis not present

## 2017-10-13 DIAGNOSIS — S5001XA Contusion of right elbow, initial encounter: Secondary | ICD-10-CM | POA: Diagnosis not present

## 2017-10-13 DIAGNOSIS — S0003XA Contusion of scalp, initial encounter: Secondary | ICD-10-CM | POA: Diagnosis not present

## 2017-10-13 DIAGNOSIS — S59901A Unspecified injury of right elbow, initial encounter: Secondary | ICD-10-CM | POA: Diagnosis not present

## 2017-10-13 DIAGNOSIS — S52124A Nondisplaced fracture of head of right radius, initial encounter for closed fracture: Secondary | ICD-10-CM | POA: Diagnosis not present

## 2017-10-13 DIAGNOSIS — M25551 Pain in right hip: Secondary | ICD-10-CM | POA: Diagnosis not present

## 2017-10-16 DIAGNOSIS — Z299 Encounter for prophylactic measures, unspecified: Secondary | ICD-10-CM | POA: Diagnosis not present

## 2017-10-16 DIAGNOSIS — B029 Zoster without complications: Secondary | ICD-10-CM | POA: Diagnosis not present

## 2017-10-16 DIAGNOSIS — R58 Hemorrhage, not elsewhere classified: Secondary | ICD-10-CM | POA: Diagnosis not present

## 2017-10-16 DIAGNOSIS — I4891 Unspecified atrial fibrillation: Secondary | ICD-10-CM | POA: Diagnosis not present

## 2017-10-16 DIAGNOSIS — M25551 Pain in right hip: Secondary | ICD-10-CM | POA: Diagnosis not present

## 2017-10-16 DIAGNOSIS — I10 Essential (primary) hypertension: Secondary | ICD-10-CM | POA: Diagnosis not present

## 2017-10-16 DIAGNOSIS — Z6829 Body mass index (BMI) 29.0-29.9, adult: Secondary | ICD-10-CM | POA: Diagnosis not present

## 2017-10-20 DIAGNOSIS — Z09 Encounter for follow-up examination after completed treatment for conditions other than malignant neoplasm: Secondary | ICD-10-CM | POA: Diagnosis not present

## 2017-10-20 DIAGNOSIS — Z8669 Personal history of other diseases of the nervous system and sense organs: Secondary | ICD-10-CM | POA: Diagnosis not present

## 2017-10-20 DIAGNOSIS — H906 Mixed conductive and sensorineural hearing loss, bilateral: Secondary | ICD-10-CM | POA: Diagnosis not present

## 2017-10-22 DIAGNOSIS — I4891 Unspecified atrial fibrillation: Secondary | ICD-10-CM | POA: Diagnosis not present

## 2017-10-22 DIAGNOSIS — I10 Essential (primary) hypertension: Secondary | ICD-10-CM | POA: Diagnosis not present

## 2017-10-22 DIAGNOSIS — E1165 Type 2 diabetes mellitus with hyperglycemia: Secondary | ICD-10-CM | POA: Diagnosis not present

## 2017-10-22 DIAGNOSIS — T148XXA Other injury of unspecified body region, initial encounter: Secondary | ICD-10-CM | POA: Diagnosis not present

## 2017-10-22 DIAGNOSIS — Z6829 Body mass index (BMI) 29.0-29.9, adult: Secondary | ICD-10-CM | POA: Diagnosis not present

## 2017-10-22 DIAGNOSIS — Z299 Encounter for prophylactic measures, unspecified: Secondary | ICD-10-CM | POA: Diagnosis not present

## 2017-10-26 DIAGNOSIS — I6782 Cerebral ischemia: Secondary | ICD-10-CM | POA: Diagnosis not present

## 2017-10-26 DIAGNOSIS — S0083XS Contusion of other part of head, sequela: Secondary | ICD-10-CM | POA: Diagnosis not present

## 2017-10-26 DIAGNOSIS — S0990XA Unspecified injury of head, initial encounter: Secondary | ICD-10-CM | POA: Diagnosis not present

## 2017-10-26 DIAGNOSIS — R600 Localized edema: Secondary | ICD-10-CM | POA: Diagnosis not present

## 2017-10-26 DIAGNOSIS — G43909 Migraine, unspecified, not intractable, without status migrainosus: Secondary | ICD-10-CM | POA: Diagnosis not present

## 2017-10-26 DIAGNOSIS — Z6829 Body mass index (BMI) 29.0-29.9, adult: Secondary | ICD-10-CM | POA: Diagnosis not present

## 2017-10-26 DIAGNOSIS — Z299 Encounter for prophylactic measures, unspecified: Secondary | ICD-10-CM | POA: Diagnosis not present

## 2017-10-26 DIAGNOSIS — I10 Essential (primary) hypertension: Secondary | ICD-10-CM | POA: Diagnosis not present

## 2017-10-26 DIAGNOSIS — R51 Headache: Secondary | ICD-10-CM | POA: Diagnosis not present

## 2017-10-27 DIAGNOSIS — S0083XA Contusion of other part of head, initial encounter: Secondary | ICD-10-CM | POA: Diagnosis not present

## 2017-11-10 DIAGNOSIS — Z9889 Other specified postprocedural states: Secondary | ICD-10-CM | POA: Diagnosis not present

## 2017-11-10 DIAGNOSIS — M25511 Pain in right shoulder: Secondary | ICD-10-CM | POA: Diagnosis not present

## 2017-11-11 DIAGNOSIS — Z024 Encounter for examination for driving license: Secondary | ICD-10-CM | POA: Diagnosis not present

## 2017-11-19 DIAGNOSIS — M5416 Radiculopathy, lumbar region: Secondary | ICD-10-CM | POA: Diagnosis not present

## 2017-11-19 DIAGNOSIS — Z6830 Body mass index (BMI) 30.0-30.9, adult: Secondary | ICD-10-CM | POA: Diagnosis not present

## 2017-11-19 DIAGNOSIS — M25551 Pain in right hip: Secondary | ICD-10-CM | POA: Diagnosis not present

## 2017-11-19 DIAGNOSIS — Z299 Encounter for prophylactic measures, unspecified: Secondary | ICD-10-CM | POA: Diagnosis not present

## 2017-11-19 DIAGNOSIS — M546 Pain in thoracic spine: Secondary | ICD-10-CM | POA: Diagnosis not present

## 2017-11-19 DIAGNOSIS — I4891 Unspecified atrial fibrillation: Secondary | ICD-10-CM | POA: Diagnosis not present

## 2017-12-02 DIAGNOSIS — Z79899 Other long term (current) drug therapy: Secondary | ICD-10-CM | POA: Diagnosis not present

## 2017-12-02 DIAGNOSIS — Z7189 Other specified counseling: Secondary | ICD-10-CM | POA: Diagnosis not present

## 2017-12-02 DIAGNOSIS — E7800 Pure hypercholesterolemia, unspecified: Secondary | ICD-10-CM | POA: Diagnosis not present

## 2017-12-02 DIAGNOSIS — Z1211 Encounter for screening for malignant neoplasm of colon: Secondary | ICD-10-CM | POA: Diagnosis not present

## 2017-12-02 DIAGNOSIS — E039 Hypothyroidism, unspecified: Secondary | ICD-10-CM | POA: Diagnosis not present

## 2017-12-02 DIAGNOSIS — Z1339 Encounter for screening examination for other mental health and behavioral disorders: Secondary | ICD-10-CM | POA: Diagnosis not present

## 2017-12-02 DIAGNOSIS — Z125 Encounter for screening for malignant neoplasm of prostate: Secondary | ICD-10-CM | POA: Diagnosis not present

## 2017-12-02 DIAGNOSIS — Z1331 Encounter for screening for depression: Secondary | ICD-10-CM | POA: Diagnosis not present

## 2017-12-02 DIAGNOSIS — R5383 Other fatigue: Secondary | ICD-10-CM | POA: Diagnosis not present

## 2017-12-02 DIAGNOSIS — Z0000 Encounter for general adult medical examination without abnormal findings: Secondary | ICD-10-CM | POA: Diagnosis not present

## 2017-12-02 DIAGNOSIS — Z299 Encounter for prophylactic measures, unspecified: Secondary | ICD-10-CM | POA: Diagnosis not present

## 2017-12-02 DIAGNOSIS — E1165 Type 2 diabetes mellitus with hyperglycemia: Secondary | ICD-10-CM | POA: Diagnosis not present

## 2017-12-02 DIAGNOSIS — I10 Essential (primary) hypertension: Secondary | ICD-10-CM | POA: Diagnosis not present

## 2017-12-02 DIAGNOSIS — Z6829 Body mass index (BMI) 29.0-29.9, adult: Secondary | ICD-10-CM | POA: Diagnosis not present

## 2017-12-07 DIAGNOSIS — Z9889 Other specified postprocedural states: Secondary | ICD-10-CM | POA: Diagnosis not present

## 2017-12-07 DIAGNOSIS — M25511 Pain in right shoulder: Secondary | ICD-10-CM | POA: Diagnosis not present

## 2017-12-09 DIAGNOSIS — J101 Influenza due to other identified influenza virus with other respiratory manifestations: Secondary | ICD-10-CM | POA: Diagnosis not present

## 2017-12-09 DIAGNOSIS — Z87891 Personal history of nicotine dependence: Secondary | ICD-10-CM | POA: Diagnosis not present

## 2017-12-09 DIAGNOSIS — Z6829 Body mass index (BMI) 29.0-29.9, adult: Secondary | ICD-10-CM | POA: Diagnosis not present

## 2017-12-09 DIAGNOSIS — R509 Fever, unspecified: Secondary | ICD-10-CM | POA: Diagnosis not present

## 2017-12-09 DIAGNOSIS — I10 Essential (primary) hypertension: Secondary | ICD-10-CM | POA: Diagnosis not present

## 2017-12-09 DIAGNOSIS — Z299 Encounter for prophylactic measures, unspecified: Secondary | ICD-10-CM | POA: Diagnosis not present

## 2017-12-15 DIAGNOSIS — K5904 Chronic idiopathic constipation: Secondary | ICD-10-CM | POA: Diagnosis not present

## 2017-12-15 DIAGNOSIS — Z299 Encounter for prophylactic measures, unspecified: Secondary | ICD-10-CM | POA: Diagnosis not present

## 2017-12-15 DIAGNOSIS — R05 Cough: Secondary | ICD-10-CM | POA: Diagnosis not present

## 2017-12-15 DIAGNOSIS — R062 Wheezing: Secondary | ICD-10-CM | POA: Diagnosis not present

## 2017-12-15 DIAGNOSIS — I10 Essential (primary) hypertension: Secondary | ICD-10-CM | POA: Diagnosis not present

## 2017-12-15 DIAGNOSIS — Z6829 Body mass index (BMI) 29.0-29.9, adult: Secondary | ICD-10-CM | POA: Diagnosis not present

## 2017-12-21 DIAGNOSIS — Z299 Encounter for prophylactic measures, unspecified: Secondary | ICD-10-CM | POA: Diagnosis not present

## 2017-12-21 DIAGNOSIS — J45909 Unspecified asthma, uncomplicated: Secondary | ICD-10-CM | POA: Diagnosis not present

## 2017-12-21 DIAGNOSIS — Z6830 Body mass index (BMI) 30.0-30.9, adult: Secondary | ICD-10-CM | POA: Diagnosis not present

## 2017-12-24 DIAGNOSIS — Z6830 Body mass index (BMI) 30.0-30.9, adult: Secondary | ICD-10-CM | POA: Diagnosis not present

## 2017-12-24 DIAGNOSIS — I10 Essential (primary) hypertension: Secondary | ICD-10-CM | POA: Diagnosis not present

## 2017-12-24 DIAGNOSIS — I4891 Unspecified atrial fibrillation: Secondary | ICD-10-CM | POA: Diagnosis not present

## 2017-12-24 DIAGNOSIS — Z299 Encounter for prophylactic measures, unspecified: Secondary | ICD-10-CM | POA: Diagnosis not present

## 2017-12-24 DIAGNOSIS — J45909 Unspecified asthma, uncomplicated: Secondary | ICD-10-CM | POA: Diagnosis not present

## 2017-12-24 DIAGNOSIS — R05 Cough: Secondary | ICD-10-CM | POA: Diagnosis not present

## 2017-12-25 DIAGNOSIS — Z6830 Body mass index (BMI) 30.0-30.9, adult: Secondary | ICD-10-CM | POA: Diagnosis not present

## 2017-12-25 DIAGNOSIS — I4891 Unspecified atrial fibrillation: Secondary | ICD-10-CM | POA: Diagnosis not present

## 2017-12-25 DIAGNOSIS — J069 Acute upper respiratory infection, unspecified: Secondary | ICD-10-CM | POA: Diagnosis not present

## 2017-12-25 DIAGNOSIS — Z299 Encounter for prophylactic measures, unspecified: Secondary | ICD-10-CM | POA: Diagnosis not present

## 2018-01-04 DIAGNOSIS — G4700 Insomnia, unspecified: Secondary | ICD-10-CM | POA: Diagnosis not present

## 2018-01-04 DIAGNOSIS — Z6829 Body mass index (BMI) 29.0-29.9, adult: Secondary | ICD-10-CM | POA: Diagnosis not present

## 2018-01-04 DIAGNOSIS — I10 Essential (primary) hypertension: Secondary | ICD-10-CM | POA: Diagnosis not present

## 2018-01-04 DIAGNOSIS — F332 Major depressive disorder, recurrent severe without psychotic features: Secondary | ICD-10-CM | POA: Diagnosis not present

## 2018-01-04 DIAGNOSIS — Z299 Encounter for prophylactic measures, unspecified: Secondary | ICD-10-CM | POA: Diagnosis not present

## 2018-01-04 DIAGNOSIS — I4891 Unspecified atrial fibrillation: Secondary | ICD-10-CM | POA: Diagnosis not present

## 2018-01-04 DIAGNOSIS — E1165 Type 2 diabetes mellitus with hyperglycemia: Secondary | ICD-10-CM | POA: Diagnosis not present

## 2018-01-14 DIAGNOSIS — Z299 Encounter for prophylactic measures, unspecified: Secondary | ICD-10-CM | POA: Diagnosis not present

## 2018-01-14 DIAGNOSIS — M25562 Pain in left knee: Secondary | ICD-10-CM | POA: Diagnosis not present

## 2018-01-14 DIAGNOSIS — Z6829 Body mass index (BMI) 29.0-29.9, adult: Secondary | ICD-10-CM | POA: Diagnosis not present

## 2018-01-14 DIAGNOSIS — I4891 Unspecified atrial fibrillation: Secondary | ICD-10-CM | POA: Diagnosis not present

## 2018-01-14 DIAGNOSIS — I10 Essential (primary) hypertension: Secondary | ICD-10-CM | POA: Diagnosis not present

## 2018-01-14 DIAGNOSIS — M25569 Pain in unspecified knee: Secondary | ICD-10-CM | POA: Diagnosis not present

## 2018-01-21 DIAGNOSIS — M25562 Pain in left knee: Secondary | ICD-10-CM | POA: Diagnosis not present

## 2018-01-21 DIAGNOSIS — M170 Bilateral primary osteoarthritis of knee: Secondary | ICD-10-CM | POA: Diagnosis not present

## 2018-01-21 DIAGNOSIS — Z6829 Body mass index (BMI) 29.0-29.9, adult: Secondary | ICD-10-CM | POA: Diagnosis not present

## 2018-01-21 DIAGNOSIS — R262 Difficulty in walking, not elsewhere classified: Secondary | ICD-10-CM | POA: Diagnosis not present

## 2018-01-21 DIAGNOSIS — Z299 Encounter for prophylactic measures, unspecified: Secondary | ICD-10-CM | POA: Diagnosis not present

## 2018-01-21 DIAGNOSIS — M25561 Pain in right knee: Secondary | ICD-10-CM | POA: Diagnosis not present

## 2018-01-21 DIAGNOSIS — I10 Essential (primary) hypertension: Secondary | ICD-10-CM | POA: Diagnosis not present

## 2018-01-22 DIAGNOSIS — Z6829 Body mass index (BMI) 29.0-29.9, adult: Secondary | ICD-10-CM | POA: Diagnosis not present

## 2018-01-22 DIAGNOSIS — M170 Bilateral primary osteoarthritis of knee: Secondary | ICD-10-CM | POA: Diagnosis not present

## 2018-01-22 DIAGNOSIS — Z299 Encounter for prophylactic measures, unspecified: Secondary | ICD-10-CM | POA: Diagnosis not present

## 2018-01-25 DIAGNOSIS — M75102 Unspecified rotator cuff tear or rupture of left shoulder, not specified as traumatic: Secondary | ICD-10-CM | POA: Diagnosis not present

## 2018-01-26 DIAGNOSIS — H40013 Open angle with borderline findings, low risk, bilateral: Secondary | ICD-10-CM | POA: Diagnosis not present

## 2018-02-05 DIAGNOSIS — M1712 Unilateral primary osteoarthritis, left knee: Secondary | ICD-10-CM | POA: Diagnosis not present

## 2018-02-05 DIAGNOSIS — M25562 Pain in left knee: Secondary | ICD-10-CM | POA: Diagnosis not present

## 2018-02-05 DIAGNOSIS — Z299 Encounter for prophylactic measures, unspecified: Secondary | ICD-10-CM | POA: Diagnosis not present

## 2018-02-05 DIAGNOSIS — Z6829 Body mass index (BMI) 29.0-29.9, adult: Secondary | ICD-10-CM | POA: Diagnosis not present

## 2018-02-05 DIAGNOSIS — I10 Essential (primary) hypertension: Secondary | ICD-10-CM | POA: Diagnosis not present

## 2018-02-08 DIAGNOSIS — M2560 Stiffness of unspecified joint, not elsewhere classified: Secondary | ICD-10-CM | POA: Diagnosis not present

## 2018-02-08 DIAGNOSIS — M1711 Unilateral primary osteoarthritis, right knee: Secondary | ICD-10-CM | POA: Diagnosis not present

## 2018-02-08 DIAGNOSIS — R262 Difficulty in walking, not elsewhere classified: Secondary | ICD-10-CM | POA: Diagnosis not present

## 2018-02-08 DIAGNOSIS — Z6829 Body mass index (BMI) 29.0-29.9, adult: Secondary | ICD-10-CM | POA: Diagnosis not present

## 2018-02-08 DIAGNOSIS — H9072 Mixed conductive and sensorineural hearing loss, unilateral, left ear, with unrestricted hearing on the contralateral side: Secondary | ICD-10-CM | POA: Diagnosis not present

## 2018-02-08 DIAGNOSIS — Z885 Allergy status to narcotic agent status: Secondary | ICD-10-CM | POA: Diagnosis not present

## 2018-02-08 DIAGNOSIS — H6123 Impacted cerumen, bilateral: Secondary | ICD-10-CM | POA: Diagnosis not present

## 2018-02-08 DIAGNOSIS — Z886 Allergy status to analgesic agent status: Secondary | ICD-10-CM | POA: Diagnosis not present

## 2018-02-08 DIAGNOSIS — I10 Essential (primary) hypertension: Secondary | ICD-10-CM | POA: Diagnosis not present

## 2018-02-08 DIAGNOSIS — Z299 Encounter for prophylactic measures, unspecified: Secondary | ICD-10-CM | POA: Diagnosis not present

## 2018-02-09 DIAGNOSIS — R269 Unspecified abnormalities of gait and mobility: Secondary | ICD-10-CM | POA: Diagnosis not present

## 2018-02-09 DIAGNOSIS — M1711 Unilateral primary osteoarthritis, right knee: Secondary | ICD-10-CM | POA: Diagnosis not present

## 2018-02-09 DIAGNOSIS — R262 Difficulty in walking, not elsewhere classified: Secondary | ICD-10-CM | POA: Diagnosis not present

## 2018-02-09 DIAGNOSIS — M2560 Stiffness of unspecified joint, not elsewhere classified: Secondary | ICD-10-CM | POA: Diagnosis not present

## 2018-02-11 DIAGNOSIS — Z299 Encounter for prophylactic measures, unspecified: Secondary | ICD-10-CM | POA: Diagnosis not present

## 2018-02-11 DIAGNOSIS — Z6829 Body mass index (BMI) 29.0-29.9, adult: Secondary | ICD-10-CM | POA: Diagnosis not present

## 2018-02-11 DIAGNOSIS — M1712 Unilateral primary osteoarthritis, left knee: Secondary | ICD-10-CM | POA: Diagnosis not present

## 2018-02-11 DIAGNOSIS — M2550 Pain in unspecified joint: Secondary | ICD-10-CM | POA: Diagnosis not present

## 2018-02-11 DIAGNOSIS — I10 Essential (primary) hypertension: Secondary | ICD-10-CM | POA: Diagnosis not present

## 2018-02-16 DIAGNOSIS — M2560 Stiffness of unspecified joint, not elsewhere classified: Secondary | ICD-10-CM | POA: Diagnosis not present

## 2018-02-16 DIAGNOSIS — I10 Essential (primary) hypertension: Secondary | ICD-10-CM | POA: Diagnosis not present

## 2018-02-16 DIAGNOSIS — R269 Unspecified abnormalities of gait and mobility: Secondary | ICD-10-CM | POA: Diagnosis not present

## 2018-02-16 DIAGNOSIS — M1711 Unilateral primary osteoarthritis, right knee: Secondary | ICD-10-CM | POA: Diagnosis not present

## 2018-02-16 DIAGNOSIS — R262 Difficulty in walking, not elsewhere classified: Secondary | ICD-10-CM | POA: Diagnosis not present

## 2018-02-16 DIAGNOSIS — Z299 Encounter for prophylactic measures, unspecified: Secondary | ICD-10-CM | POA: Diagnosis not present

## 2018-02-16 DIAGNOSIS — Z6829 Body mass index (BMI) 29.0-29.9, adult: Secondary | ICD-10-CM | POA: Diagnosis not present

## 2018-02-17 DIAGNOSIS — M67919 Unspecified disorder of synovium and tendon, unspecified shoulder: Secondary | ICD-10-CM | POA: Diagnosis not present

## 2018-02-17 DIAGNOSIS — M719 Bursopathy, unspecified: Secondary | ICD-10-CM | POA: Diagnosis not present

## 2018-02-19 DIAGNOSIS — M1712 Unilateral primary osteoarthritis, left knee: Secondary | ICD-10-CM | POA: Diagnosis not present

## 2018-02-19 DIAGNOSIS — I10 Essential (primary) hypertension: Secondary | ICD-10-CM | POA: Diagnosis not present

## 2018-02-19 DIAGNOSIS — Z299 Encounter for prophylactic measures, unspecified: Secondary | ICD-10-CM | POA: Diagnosis not present

## 2018-02-19 DIAGNOSIS — M25569 Pain in unspecified knee: Secondary | ICD-10-CM | POA: Diagnosis not present

## 2018-02-19 DIAGNOSIS — Z6829 Body mass index (BMI) 29.0-29.9, adult: Secondary | ICD-10-CM | POA: Diagnosis not present

## 2018-02-23 DIAGNOSIS — I10 Essential (primary) hypertension: Secondary | ICD-10-CM | POA: Diagnosis not present

## 2018-02-23 DIAGNOSIS — Z299 Encounter for prophylactic measures, unspecified: Secondary | ICD-10-CM | POA: Diagnosis not present

## 2018-02-23 DIAGNOSIS — M1711 Unilateral primary osteoarthritis, right knee: Secondary | ICD-10-CM | POA: Diagnosis not present

## 2018-02-23 DIAGNOSIS — Z6829 Body mass index (BMI) 29.0-29.9, adult: Secondary | ICD-10-CM | POA: Diagnosis not present

## 2018-02-25 DIAGNOSIS — M1712 Unilateral primary osteoarthritis, left knee: Secondary | ICD-10-CM | POA: Diagnosis not present

## 2018-02-25 DIAGNOSIS — Z6829 Body mass index (BMI) 29.0-29.9, adult: Secondary | ICD-10-CM | POA: Diagnosis not present

## 2018-02-25 DIAGNOSIS — Z299 Encounter for prophylactic measures, unspecified: Secondary | ICD-10-CM | POA: Diagnosis not present

## 2018-03-02 DIAGNOSIS — M25569 Pain in unspecified knee: Secondary | ICD-10-CM | POA: Diagnosis not present

## 2018-03-02 DIAGNOSIS — M1711 Unilateral primary osteoarthritis, right knee: Secondary | ICD-10-CM | POA: Diagnosis not present

## 2018-03-02 DIAGNOSIS — Z6829 Body mass index (BMI) 29.0-29.9, adult: Secondary | ICD-10-CM | POA: Diagnosis not present

## 2018-03-02 DIAGNOSIS — I10 Essential (primary) hypertension: Secondary | ICD-10-CM | POA: Diagnosis not present

## 2018-03-02 DIAGNOSIS — Z299 Encounter for prophylactic measures, unspecified: Secondary | ICD-10-CM | POA: Diagnosis not present

## 2018-03-03 DIAGNOSIS — Z462 Encounter for fitting and adjustment of other devices related to nervous system and special senses: Secondary | ICD-10-CM | POA: Diagnosis not present

## 2018-03-03 DIAGNOSIS — H903 Sensorineural hearing loss, bilateral: Secondary | ICD-10-CM | POA: Diagnosis not present

## 2018-03-05 DIAGNOSIS — Z6829 Body mass index (BMI) 29.0-29.9, adult: Secondary | ICD-10-CM | POA: Diagnosis not present

## 2018-03-05 DIAGNOSIS — Z299 Encounter for prophylactic measures, unspecified: Secondary | ICD-10-CM | POA: Diagnosis not present

## 2018-03-05 DIAGNOSIS — I10 Essential (primary) hypertension: Secondary | ICD-10-CM | POA: Diagnosis not present

## 2018-03-05 DIAGNOSIS — M1712 Unilateral primary osteoarthritis, left knee: Secondary | ICD-10-CM | POA: Diagnosis not present

## 2018-03-09 DIAGNOSIS — M1711 Unilateral primary osteoarthritis, right knee: Secondary | ICD-10-CM | POA: Diagnosis not present

## 2018-03-09 DIAGNOSIS — I10 Essential (primary) hypertension: Secondary | ICD-10-CM | POA: Diagnosis not present

## 2018-03-09 DIAGNOSIS — Z6829 Body mass index (BMI) 29.0-29.9, adult: Secondary | ICD-10-CM | POA: Diagnosis not present

## 2018-03-09 DIAGNOSIS — Z299 Encounter for prophylactic measures, unspecified: Secondary | ICD-10-CM | POA: Diagnosis not present

## 2018-03-11 DIAGNOSIS — I10 Essential (primary) hypertension: Secondary | ICD-10-CM | POA: Diagnosis not present

## 2018-03-11 DIAGNOSIS — Z299 Encounter for prophylactic measures, unspecified: Secondary | ICD-10-CM | POA: Diagnosis not present

## 2018-03-11 DIAGNOSIS — M1712 Unilateral primary osteoarthritis, left knee: Secondary | ICD-10-CM | POA: Diagnosis not present

## 2018-03-11 DIAGNOSIS — Z6829 Body mass index (BMI) 29.0-29.9, adult: Secondary | ICD-10-CM | POA: Diagnosis not present

## 2018-03-15 ENCOUNTER — Ambulatory Visit: Payer: PPO | Admitting: Cardiology

## 2018-03-20 DIAGNOSIS — B9689 Other specified bacterial agents as the cause of diseases classified elsewhere: Secondary | ICD-10-CM | POA: Diagnosis not present

## 2018-03-20 DIAGNOSIS — R05 Cough: Secondary | ICD-10-CM | POA: Diagnosis not present

## 2018-03-20 DIAGNOSIS — J019 Acute sinusitis, unspecified: Secondary | ICD-10-CM | POA: Diagnosis not present

## 2018-03-20 DIAGNOSIS — J029 Acute pharyngitis, unspecified: Secondary | ICD-10-CM | POA: Diagnosis not present

## 2018-03-31 ENCOUNTER — Telehealth: Payer: Self-pay | Admitting: *Deleted

## 2018-03-31 NOTE — Telephone Encounter (Signed)
   Cardiac Questionnaire:    Since your last visit or hospitalization:    1. Have you been having new or worsening chest pain? No   2. Have you been having new or worsening shortness of breath? No 3. Have you been having new or worsening leg swelling, wt gain, or increase in abdominal girth (pants fitting more tightly)? No   4. Have you had any passing out spells? No    Given the current situation regarding the worldwide coronarvirus pandemic, at the recommendation of the CDC, we are looking to limit gatherings in our waiting area, and thus will reschedule their appointment beyond four weeks from today.     

## 2018-04-01 DIAGNOSIS — K2900 Acute gastritis without bleeding: Secondary | ICD-10-CM | POA: Diagnosis not present

## 2018-04-01 DIAGNOSIS — Z299 Encounter for prophylactic measures, unspecified: Secondary | ICD-10-CM | POA: Diagnosis not present

## 2018-04-01 DIAGNOSIS — K921 Melena: Secondary | ICD-10-CM | POA: Diagnosis not present

## 2018-04-01 DIAGNOSIS — E1165 Type 2 diabetes mellitus with hyperglycemia: Secondary | ICD-10-CM | POA: Diagnosis not present

## 2018-04-01 DIAGNOSIS — I10 Essential (primary) hypertension: Secondary | ICD-10-CM | POA: Diagnosis not present

## 2018-04-01 DIAGNOSIS — Z6830 Body mass index (BMI) 30.0-30.9, adult: Secondary | ICD-10-CM | POA: Diagnosis not present

## 2018-04-05 ENCOUNTER — Ambulatory Visit: Payer: PPO | Admitting: Cardiology

## 2018-04-15 DIAGNOSIS — G4700 Insomnia, unspecified: Secondary | ICD-10-CM | POA: Diagnosis not present

## 2018-04-15 DIAGNOSIS — Z299 Encounter for prophylactic measures, unspecified: Secondary | ICD-10-CM | POA: Diagnosis not present

## 2018-04-15 DIAGNOSIS — M25561 Pain in right knee: Secondary | ICD-10-CM | POA: Diagnosis not present

## 2018-04-15 DIAGNOSIS — Z1231 Encounter for screening mammogram for malignant neoplasm of breast: Secondary | ICD-10-CM | POA: Diagnosis not present

## 2018-04-15 DIAGNOSIS — I10 Essential (primary) hypertension: Secondary | ICD-10-CM | POA: Diagnosis not present

## 2018-04-15 DIAGNOSIS — Z6827 Body mass index (BMI) 27.0-27.9, adult: Secondary | ICD-10-CM | POA: Diagnosis not present

## 2018-04-15 DIAGNOSIS — R531 Weakness: Secondary | ICD-10-CM | POA: Diagnosis not present

## 2018-05-10 DIAGNOSIS — M25512 Pain in left shoulder: Secondary | ICD-10-CM | POA: Diagnosis not present

## 2018-05-10 DIAGNOSIS — M719 Bursopathy, unspecified: Secondary | ICD-10-CM | POA: Diagnosis not present

## 2018-05-10 DIAGNOSIS — M67919 Unspecified disorder of synovium and tendon, unspecified shoulder: Secondary | ICD-10-CM | POA: Diagnosis not present

## 2018-05-12 DIAGNOSIS — M719 Bursopathy, unspecified: Secondary | ICD-10-CM | POA: Diagnosis not present

## 2018-05-12 DIAGNOSIS — M67919 Unspecified disorder of synovium and tendon, unspecified shoulder: Secondary | ICD-10-CM | POA: Diagnosis not present

## 2018-05-12 DIAGNOSIS — M25512 Pain in left shoulder: Secondary | ICD-10-CM | POA: Diagnosis not present

## 2018-06-11 ENCOUNTER — Ambulatory Visit: Payer: PPO | Admitting: Cardiology

## 2018-06-11 DIAGNOSIS — I10 Essential (primary) hypertension: Secondary | ICD-10-CM | POA: Diagnosis not present

## 2018-06-11 DIAGNOSIS — Z6829 Body mass index (BMI) 29.0-29.9, adult: Secondary | ICD-10-CM | POA: Diagnosis not present

## 2018-06-11 DIAGNOSIS — M2550 Pain in unspecified joint: Secondary | ICD-10-CM | POA: Diagnosis not present

## 2018-06-11 DIAGNOSIS — Z299 Encounter for prophylactic measures, unspecified: Secondary | ICD-10-CM | POA: Diagnosis not present

## 2018-06-11 DIAGNOSIS — E1140 Type 2 diabetes mellitus with diabetic neuropathy, unspecified: Secondary | ICD-10-CM | POA: Diagnosis not present

## 2018-07-27 DIAGNOSIS — Z961 Presence of intraocular lens: Secondary | ICD-10-CM | POA: Diagnosis not present

## 2018-07-27 DIAGNOSIS — H40013 Open angle with borderline findings, low risk, bilateral: Secondary | ICD-10-CM | POA: Diagnosis not present

## 2018-07-27 DIAGNOSIS — H353132 Nonexudative age-related macular degeneration, bilateral, intermediate dry stage: Secondary | ICD-10-CM | POA: Diagnosis not present

## 2018-07-27 DIAGNOSIS — E119 Type 2 diabetes mellitus without complications: Secondary | ICD-10-CM | POA: Diagnosis not present

## 2018-08-03 DIAGNOSIS — I4891 Unspecified atrial fibrillation: Secondary | ICD-10-CM | POA: Diagnosis not present

## 2018-08-03 DIAGNOSIS — M545 Low back pain: Secondary | ICD-10-CM | POA: Diagnosis not present

## 2018-08-03 DIAGNOSIS — Z6829 Body mass index (BMI) 29.0-29.9, adult: Secondary | ICD-10-CM | POA: Diagnosis not present

## 2018-08-03 DIAGNOSIS — Z299 Encounter for prophylactic measures, unspecified: Secondary | ICD-10-CM | POA: Diagnosis not present

## 2018-08-03 DIAGNOSIS — F332 Major depressive disorder, recurrent severe without psychotic features: Secondary | ICD-10-CM | POA: Diagnosis not present

## 2018-08-06 DIAGNOSIS — M549 Dorsalgia, unspecified: Secondary | ICD-10-CM | POA: Diagnosis not present

## 2018-08-06 DIAGNOSIS — Z299 Encounter for prophylactic measures, unspecified: Secondary | ICD-10-CM | POA: Diagnosis not present

## 2018-08-06 DIAGNOSIS — M545 Low back pain: Secondary | ICD-10-CM | POA: Diagnosis not present

## 2018-08-06 DIAGNOSIS — S32010A Wedge compression fracture of first lumbar vertebra, initial encounter for closed fracture: Secondary | ICD-10-CM | POA: Diagnosis not present

## 2018-08-06 DIAGNOSIS — Z6829 Body mass index (BMI) 29.0-29.9, adult: Secondary | ICD-10-CM | POA: Diagnosis not present

## 2018-08-06 DIAGNOSIS — Z713 Dietary counseling and surveillance: Secondary | ICD-10-CM | POA: Diagnosis not present

## 2018-08-06 DIAGNOSIS — I4891 Unspecified atrial fibrillation: Secondary | ICD-10-CM | POA: Diagnosis not present

## 2018-08-06 DIAGNOSIS — S32020A Wedge compression fracture of second lumbar vertebra, initial encounter for closed fracture: Secondary | ICD-10-CM | POA: Diagnosis not present

## 2018-08-09 DIAGNOSIS — M545 Low back pain: Secondary | ICD-10-CM | POA: Diagnosis not present

## 2018-08-09 DIAGNOSIS — S32020A Wedge compression fracture of second lumbar vertebra, initial encounter for closed fracture: Secondary | ICD-10-CM | POA: Diagnosis not present

## 2018-08-09 DIAGNOSIS — S32010A Wedge compression fracture of first lumbar vertebra, initial encounter for closed fracture: Secondary | ICD-10-CM | POA: Diagnosis not present

## 2018-08-09 DIAGNOSIS — M5126 Other intervertebral disc displacement, lumbar region: Secondary | ICD-10-CM | POA: Diagnosis not present

## 2018-08-10 DIAGNOSIS — Z299 Encounter for prophylactic measures, unspecified: Secondary | ICD-10-CM | POA: Diagnosis not present

## 2018-08-10 DIAGNOSIS — Z6830 Body mass index (BMI) 30.0-30.9, adult: Secondary | ICD-10-CM | POA: Diagnosis not present

## 2018-08-10 DIAGNOSIS — I10 Essential (primary) hypertension: Secondary | ICD-10-CM | POA: Diagnosis not present

## 2018-08-10 DIAGNOSIS — S32000A Wedge compression fracture of unspecified lumbar vertebra, initial encounter for closed fracture: Secondary | ICD-10-CM | POA: Diagnosis not present

## 2018-08-13 DIAGNOSIS — S32020A Wedge compression fracture of second lumbar vertebra, initial encounter for closed fracture: Secondary | ICD-10-CM | POA: Diagnosis not present

## 2018-08-13 DIAGNOSIS — M47816 Spondylosis without myelopathy or radiculopathy, lumbar region: Secondary | ICD-10-CM | POA: Diagnosis not present

## 2018-08-13 DIAGNOSIS — M545 Low back pain: Secondary | ICD-10-CM | POA: Diagnosis not present

## 2018-08-13 DIAGNOSIS — S32010A Wedge compression fracture of first lumbar vertebra, initial encounter for closed fracture: Secondary | ICD-10-CM | POA: Diagnosis not present

## 2018-08-16 DIAGNOSIS — Z1159 Encounter for screening for other viral diseases: Secondary | ICD-10-CM | POA: Diagnosis not present

## 2018-08-18 ENCOUNTER — Telehealth: Payer: Self-pay | Admitting: Student

## 2018-08-18 NOTE — Telephone Encounter (Signed)
     Covering for Dr. Domenic Polite. The office had received a cardiac clearance form in regards to the patient's upcoming kyphoplasty. This is already scheduled for 08/20/2018. I contacted the patient and he denies any recent anginal symptoms. RCRI Risk is low at 0.4% risk of a major cardiac event. He has known paroxysmal atrial fibrillation and is on Xarelto. I reviewed with the anticoagulation clinic [facility]) and he can hold this for 48 hours prior to surgery and resume as soon as possible afterwards. Clearance form faxed over to [facility].   Signed, Erma Heritage, PA-C 08/18/2018, 4:38 PM Pager: [phone]

## 2018-08-20 DIAGNOSIS — X58XXXA Exposure to other specified factors, initial encounter: Secondary | ICD-10-CM | POA: Diagnosis not present

## 2018-08-20 DIAGNOSIS — Y939 Activity, unspecified: Secondary | ICD-10-CM | POA: Diagnosis not present

## 2018-08-20 DIAGNOSIS — Y929 Unspecified place or not applicable: Secondary | ICD-10-CM | POA: Diagnosis not present

## 2018-08-20 DIAGNOSIS — Y999 Unspecified external cause status: Secondary | ICD-10-CM | POA: Diagnosis not present

## 2018-08-20 DIAGNOSIS — M545 Low back pain: Secondary | ICD-10-CM | POA: Diagnosis not present

## 2018-08-20 DIAGNOSIS — S32010A Wedge compression fracture of first lumbar vertebra, initial encounter for closed fracture: Secondary | ICD-10-CM | POA: Diagnosis not present

## 2018-08-25 DIAGNOSIS — I4891 Unspecified atrial fibrillation: Secondary | ICD-10-CM | POA: Diagnosis not present

## 2018-08-25 DIAGNOSIS — Z9889 Other specified postprocedural states: Secondary | ICD-10-CM | POA: Diagnosis not present

## 2018-08-25 DIAGNOSIS — Z299 Encounter for prophylactic measures, unspecified: Secondary | ICD-10-CM | POA: Diagnosis not present

## 2018-08-25 DIAGNOSIS — E1140 Type 2 diabetes mellitus with diabetic neuropathy, unspecified: Secondary | ICD-10-CM | POA: Diagnosis not present

## 2018-08-27 ENCOUNTER — Encounter: Payer: Self-pay | Admitting: *Deleted

## 2018-08-29 NOTE — Progress Notes (Deleted)
Cardiology Office Note  Date: 08/29/2018   ID: Brett Matthews, DOB Nov 18, 1932, MRN [id number]  PCP:  Brett Chroman, MD  Cardiologist:  Brett Lesches, MD Electrophysiologist:  None   No chief complaint on file.   History of Present Illness: Brett Matthews is an 85 y.o. male last seen in September 2019.  Past Medical History:  Diagnosis Date  . Anemia, iron deficiency    Negative capsule endoscopy  . Arthritis   . Coronary atherosclerosis of native coronary artery    60 % circumflex stenosis; EF 65%, Cath 6/13  . Diverticula of colon    Pancolonic  . DM (diabetes mellitus), type 2, uncontrolled ([place])   . Dyspnea    Normal cardiopulmonary function test in July 2008, negative echocardiogram with "bubble" study for inter-cardiac shunt.  . Essential hypertension, benign   . Gastroesophageal reflux disease   . Hemorrhoids   . Hyperplastic colon polyp 04/06/2007  . Hypothyroidism 2003   Status post total thyroidectomy  . Migraines   . Neoplasm of lymphatic and hematopoietic tissue   . Paroxysmal atrial fibrillation ([place])    Diagnosed 2005  . Skin cancer     Past Surgical History:  Procedure Laterality Date  . BACK SURGERY    . BONE MARROW BIOPSY  1990's  . CATARACT EXTRACTION Bilateral   . COLONOSCOPY  04/06/2007   Dr. Gala Romney- Normal rectum with scattered pancolonic diverticula and slightly redundant elongated colon, diminutive polpy midsigmoid, remainder of colonic mucosa appeared normal. bx= hyperplastic polyp  . COLONOSCOPY N/A 11/29/2012   [facility]:[id number] preparation. Friable anal canal/internal hemorrhoids; otherwise, normal rectum. Normal-appearing colonic mucosa  . COLONOSCOPY WITH PROPOFOL N/A 03/12/2017   Procedure: COLONOSCOPY WITH PROPOFOL;  Surgeon: Brett Dolin, MD;  [facility];  Service: Endoscopy;  Laterality: N/A;  7:30am  . ESOPHAGOGASTRODUODENOSCOPY  04/06/2007   Dr. Gala Romney- normal esophagus s/p nissen fundoplication, intact nissen wrap o/w  normal stomach  . ESOPHAGOGASTRODUODENOSCOPY N/A 11/29/2012   RMR: Abnormall distal esophagus bx c/w GERD. prior fundoplication. Gastric polyps  -bx benign. Status post biopsy of normal--appearing duodenal mucosa (bx neg)  . HEMORRHOID SURGERY N/A 04/06/2017   Procedure: EXTENSIVE HEMORRHOIDECTOMY;  Surgeon: Aviva Signs, MD;  Location: [facility];  Service: General;  Laterality: N/A;  . LEFT HEART CATHETERIZATION WITH CORONARY ANGIOGRAM N/A 06/27/2011   Procedure: LEFT HEART CATHETERIZATION WITH CORONARY ANGIOGRAM;  Surgeon: Thayer Headings, MD;  Location: [facility] CATH LAB;  Service: Cardiovascular;  Laterality: N/A;  . NISSEN FUNDOPLICATION    . POLYPECTOMY  03/12/2017   Procedure: POLYPECTOMY;  Surgeon: Brett Dolin, MD;  [facility];  Service: Endoscopy;;  cecal x3  . POSTERIOR LAMINECTOMY / DECOMPRESSION LUMBAR SPINE  ~ 1990's  . SKIN CANCER EXCISION     "off my back and chest; from sun"    Current Outpatient Medications  Medication Sig Dispense Refill  . amLODipine (NORVASC) 10 MG tablet Take 10 mg by mouth daily.      . benazepril (LOTENSIN) 40 MG tablet Take 40 mg by mouth daily.    . Calcium Polycarbophil (FIBER-CAPS PO) Take 1 capsule by mouth 2 (two) times daily.    . carvedilol (COREG) 12.5 MG tablet Take 18.75 mg by mouth 2 (two) times daily with a meal.    . citalopram (CELEXA) 20 MG tablet Take 1 tablet by mouth daily.  3  . clonazePAM (KLONOPIN) 0.5 MG tablet Take 1 tablet by mouth 2 (two) times daily as needed.  3  . DYMISTA 137-50 MCG/ACT SUSP Place 1 spray into the nose daily.     . folic acid (FOLVITE) 1 MG tablet Take 1 tablet by mouth daily.  2  . gabapentin (NEURONTIN) 100 MG capsule Take 100 mg by mouth 2 (two) times daily. & 3 at bedtime    . glimepiride (AMARYL) 2 MG tablet Take 2 mg by mouth 2 (two) times daily.  9  . levothyroxine (SYNTHROID) 125 MCG tablet Take 125 mcg by mouth daily before breakfast. SYNTHROID - brand name only    . metFORMIN (GLUCOPHAGE)  500 MG tablet Take by mouth. Take 2 tabs (1,041m) by mouth every morning & 1 tab (5034m every evening    . neomycin-polymyxin-dexameth (MAXITROL) 0.1 % OINT Place 1 application into the right eye at bedtime.    . nitroGLYCERIN (NITROSTAT) 0.4 MG SL tablet Place 0.4 mg under the tongue every 5 (five) minutes as needed for chest pain.    . pantoprazole (PROTONIX) 40 MG tablet Take 40 mg by mouth daily.      . rivaroxaban (XARELTO) 20 MG TABS tablet Take 20 mg by mouth daily with supper.    . tamsulosin (FLOMAX) 0.4 MG CAPS capsule Take 0.4 mg by mouth daily after supper.      No current facility-administered medications for this visit.    Allergies:  Patient has no known allergies.   Social History: The patient  reports that he has never smoked. He has never used smokeless tobacco. He reports that he does not drink alcohol or use drugs.   Family History: The patient's family history includes Cancer in an other family member; Stroke in an other family member.   ROS:  Please see the history of present illness. Otherwise, complete review of systems is positive for {NONE DEFAULTED:18576::"none"}.  All other systems are reviewed and negative.   Physical Exam: VS:  There were no vitals taken for this visit., BMI There is no height or weight on file to calculate BMI.  Wt Readings from Last 3 Encounters:  09/16/17 202 lb (91.6 kg)  04/14/17 204 lb (92.5 kg)  04/08/17 204 lb (92.5 kg)    General: Patient appears comfortable at rest. HEENT: Conjunctiva and lids normal, oropharynx clear with moist mucosa. Neck: Supple, no elevated JVP or carotid bruits, no thyromegaly. Lungs: Clear to auscultation, nonlabored breathing at rest. Cardiac: Regular rate and rhythm, no S3 or significant systolic murmur, no pericardial rub. Abdomen: Soft, nontender, no hepatomegaly, bowel sounds present, no guarding or rebound. Extremities: No pitting edema, distal pulses 2+. Skin: Warm and dry. Musculoskeletal: No  kyphosis. Neuropsychiatric: Alert and oriented x3, affect grossly appropriate.  ECG:  An ECG dated 08/07/2017 was personally reviewed today and demonstrated:  Sinus rhythm with PACs, left anterior fascicular block, nonspecific ST changes.  Recent Labwork:  04/08/2017: ALT 17; AST 17; BUN 8; Creatinine, Ser 0.80; Hemoglobin 12.0; Platelets 284; Potassium 3.8; Sodium 138   Other Studies Reviewed Today:  [facility]: Mild LVH with LVEF 6046-80%diastolic dysfunction, mild left atrial enlargement, upper normal RV chamber size, MAC with mild mitral regurgitation, sclerotic aortic valve with trace aortic regurgitation, mild tricuspid regurgitation with RVSP 25-35 mmHg.  Assessment and Plan:    Medication Adjustments/Labs and Tests Ordered: Current medicines are reviewed at length with the patient today.  Concerns regarding medicines are outlined above.   Tests Ordered: No orders of the defined types were placed in this encounter.   Medication Changes: No orders  of the defined types were placed in this encounter.   Disposition:  Follow up {follow up:15908}  Signed, Satira Sark, MD, [facility] 08/29/2018 3:03 PM    [facility] at [place], [place], [place] [address] Phone: [phone]; Fax: [phone]

## 2018-08-30 ENCOUNTER — Ambulatory Visit: Payer: PPO | Admitting: Cardiology

## 2018-08-30 DIAGNOSIS — M25521 Pain in right elbow: Secondary | ICD-10-CM | POA: Diagnosis not present

## 2018-08-30 DIAGNOSIS — S46012A Strain of muscle(s) and tendon(s) of the rotator cuff of left shoulder, initial encounter: Secondary | ICD-10-CM | POA: Diagnosis not present

## 2018-08-30 DIAGNOSIS — M25512 Pain in left shoulder: Secondary | ICD-10-CM | POA: Diagnosis not present

## 2018-08-31 ENCOUNTER — Encounter: Payer: Self-pay | Admitting: Cardiology

## 2018-09-02 DIAGNOSIS — M25512 Pain in left shoulder: Secondary | ICD-10-CM | POA: Diagnosis not present

## 2018-09-02 DIAGNOSIS — M719 Bursopathy, unspecified: Secondary | ICD-10-CM | POA: Diagnosis not present

## 2018-09-02 DIAGNOSIS — M67919 Unspecified disorder of synovium and tendon, unspecified shoulder: Secondary | ICD-10-CM | POA: Diagnosis not present

## 2018-09-08 DIAGNOSIS — S32020A Wedge compression fracture of second lumbar vertebra, initial encounter for closed fracture: Secondary | ICD-10-CM | POA: Diagnosis not present

## 2018-09-08 DIAGNOSIS — Z6829 Body mass index (BMI) 29.0-29.9, adult: Secondary | ICD-10-CM | POA: Diagnosis not present

## 2018-09-08 DIAGNOSIS — S32010A Wedge compression fracture of first lumbar vertebra, initial encounter for closed fracture: Secondary | ICD-10-CM | POA: Diagnosis not present

## 2018-09-08 DIAGNOSIS — Z9889 Other specified postprocedural states: Secondary | ICD-10-CM | POA: Diagnosis not present

## 2018-10-05 DIAGNOSIS — I10 Essential (primary) hypertension: Secondary | ICD-10-CM | POA: Diagnosis not present

## 2018-10-05 DIAGNOSIS — Z299 Encounter for prophylactic measures, unspecified: Secondary | ICD-10-CM | POA: Diagnosis not present

## 2018-10-05 DIAGNOSIS — S0031XA Abrasion of nose, initial encounter: Secondary | ICD-10-CM | POA: Diagnosis not present

## 2018-10-05 DIAGNOSIS — Z6830 Body mass index (BMI) 30.0-30.9, adult: Secondary | ICD-10-CM | POA: Diagnosis not present

## 2018-10-05 DIAGNOSIS — I4891 Unspecified atrial fibrillation: Secondary | ICD-10-CM | POA: Diagnosis not present

## 2018-10-07 DIAGNOSIS — M25512 Pain in left shoulder: Secondary | ICD-10-CM | POA: Diagnosis not present

## 2018-10-15 DIAGNOSIS — H6123 Impacted cerumen, bilateral: Secondary | ICD-10-CM | POA: Diagnosis not present

## 2018-10-26 DIAGNOSIS — Z299 Encounter for prophylactic measures, unspecified: Secondary | ICD-10-CM | POA: Diagnosis not present

## 2018-10-26 DIAGNOSIS — I4891 Unspecified atrial fibrillation: Secondary | ICD-10-CM | POA: Diagnosis not present

## 2018-10-26 DIAGNOSIS — E1165 Type 2 diabetes mellitus with hyperglycemia: Secondary | ICD-10-CM | POA: Diagnosis not present

## 2018-10-26 DIAGNOSIS — L089 Local infection of the skin and subcutaneous tissue, unspecified: Secondary | ICD-10-CM | POA: Diagnosis not present

## 2018-10-26 DIAGNOSIS — Z6829 Body mass index (BMI) 29.0-29.9, adult: Secondary | ICD-10-CM | POA: Diagnosis not present

## 2018-10-26 DIAGNOSIS — I10 Essential (primary) hypertension: Secondary | ICD-10-CM | POA: Diagnosis not present

## 2018-10-26 DIAGNOSIS — S0081XA Abrasion of other part of head, initial encounter: Secondary | ICD-10-CM | POA: Diagnosis not present

## 2018-11-02 DIAGNOSIS — E1165 Type 2 diabetes mellitus with hyperglycemia: Secondary | ICD-10-CM | POA: Diagnosis not present

## 2018-11-02 DIAGNOSIS — I10 Essential (primary) hypertension: Secondary | ICD-10-CM | POA: Diagnosis not present

## 2018-11-02 DIAGNOSIS — Z299 Encounter for prophylactic measures, unspecified: Secondary | ICD-10-CM | POA: Diagnosis not present

## 2018-11-02 DIAGNOSIS — E1142 Type 2 diabetes mellitus with diabetic polyneuropathy: Secondary | ICD-10-CM | POA: Diagnosis not present

## 2018-11-02 DIAGNOSIS — K219 Gastro-esophageal reflux disease without esophagitis: Secondary | ICD-10-CM | POA: Diagnosis not present

## 2018-11-04 ENCOUNTER — Telehealth: Payer: Self-pay | Admitting: Cardiology

## 2018-11-04 NOTE — Telephone Encounter (Signed)
Pt c/o low BP causing him to feel weak and lightheaded for the last 3 days has been 90s/60s - last night had chest pain lasting for around an hour and laid down and went to sleep - denies any SOB/dizziness/swelling - went to pcp and was told to contact us if he should hold off on taking carvedilol amlodipine and benazepril - pt has f/u with Dr Domenic Polite on 11/9 - pt doesn't know what HR was when he went to pcp and doesn't check it at home

## 2018-11-04 NOTE — Telephone Encounter (Signed)
Called Dr Woody Seller office pt BP was 98/63 HR 58

## 2018-11-04 NOTE — Telephone Encounter (Signed)
His PCP told him to contact Dr Domenic Polite about his medications - he has been having chest pain past couple of nights. But not at time of call Wants to know why he is having to take three heart medications.

## 2018-11-04 NOTE — Telephone Encounter (Signed)
Pt voiced understanding - will hold amlodipine and agreeable to seek ED evaluation if symptoms/BP worsen or chest pain returns

## 2018-11-04 NOTE — Telephone Encounter (Signed)
Noted, same plan.

## 2018-11-04 NOTE — Telephone Encounter (Signed)
Maybe we can get the vital signs from his PCP office visit.  In the short-term would have him hold Norvasc.  If he continues to have chest pain and notes low blood pressures, he should be seen in the ER for more acute evaluation.

## 2018-11-05 ENCOUNTER — Encounter: Payer: Self-pay | Admitting: Cardiology

## 2018-11-05 DIAGNOSIS — E1140 Type 2 diabetes mellitus with diabetic neuropathy, unspecified: Secondary | ICD-10-CM | POA: Diagnosis not present

## 2018-11-05 DIAGNOSIS — I4891 Unspecified atrial fibrillation: Secondary | ICD-10-CM | POA: Diagnosis not present

## 2018-11-05 DIAGNOSIS — I471 Supraventricular tachycardia: Secondary | ICD-10-CM | POA: Diagnosis not present

## 2018-11-05 DIAGNOSIS — I4892 Unspecified atrial flutter: Secondary | ICD-10-CM | POA: Diagnosis not present

## 2018-11-05 DIAGNOSIS — I10 Essential (primary) hypertension: Secondary | ICD-10-CM | POA: Diagnosis not present

## 2018-11-05 DIAGNOSIS — R079 Chest pain, unspecified: Secondary | ICD-10-CM | POA: Diagnosis not present

## 2018-11-06 DIAGNOSIS — I10 Essential (primary) hypertension: Secondary | ICD-10-CM | POA: Diagnosis not present

## 2018-11-06 DIAGNOSIS — I4891 Unspecified atrial fibrillation: Secondary | ICD-10-CM | POA: Diagnosis not present

## 2018-11-06 DIAGNOSIS — I4892 Unspecified atrial flutter: Secondary | ICD-10-CM | POA: Diagnosis not present

## 2018-11-06 DIAGNOSIS — E1140 Type 2 diabetes mellitus with diabetic neuropathy, unspecified: Secondary | ICD-10-CM | POA: Diagnosis not present

## 2018-11-07 ENCOUNTER — Encounter: Payer: Self-pay | Admitting: Cardiology

## 2018-11-07 DIAGNOSIS — I2510 Atherosclerotic heart disease of native coronary artery without angina pectoris: Secondary | ICD-10-CM | POA: Diagnosis not present

## 2018-11-07 DIAGNOSIS — Z7984 Long term (current) use of oral hypoglycemic drugs: Secondary | ICD-10-CM | POA: Diagnosis not present

## 2018-11-07 DIAGNOSIS — K219 Gastro-esophageal reflux disease without esophagitis: Secondary | ICD-10-CM | POA: Diagnosis not present

## 2018-11-07 DIAGNOSIS — I10 Essential (primary) hypertension: Secondary | ICD-10-CM | POA: Diagnosis not present

## 2018-11-07 DIAGNOSIS — E039 Hypothyroidism, unspecified: Secondary | ICD-10-CM | POA: Diagnosis not present

## 2018-11-07 DIAGNOSIS — E1140 Type 2 diabetes mellitus with diabetic neuropathy, unspecified: Secondary | ICD-10-CM | POA: Diagnosis not present

## 2018-11-07 DIAGNOSIS — Z87891 Personal history of nicotine dependence: Secondary | ICD-10-CM | POA: Diagnosis not present

## 2018-11-07 DIAGNOSIS — Z7901 Long term (current) use of anticoagulants: Secondary | ICD-10-CM | POA: Diagnosis not present

## 2018-11-07 DIAGNOSIS — N400 Enlarged prostate without lower urinary tract symptoms: Secondary | ICD-10-CM | POA: Diagnosis not present

## 2018-11-07 DIAGNOSIS — Z85828 Personal history of other malignant neoplasm of skin: Secondary | ICD-10-CM | POA: Diagnosis not present

## 2018-11-07 DIAGNOSIS — Z20828 Contact with and (suspected) exposure to other viral communicable diseases: Secondary | ICD-10-CM | POA: Diagnosis not present

## 2018-11-07 DIAGNOSIS — I4892 Unspecified atrial flutter: Secondary | ICD-10-CM | POA: Diagnosis not present

## 2018-11-07 DIAGNOSIS — I4891 Unspecified atrial fibrillation: Secondary | ICD-10-CM | POA: Diagnosis not present

## 2018-11-10 DIAGNOSIS — E1165 Type 2 diabetes mellitus with hyperglycemia: Secondary | ICD-10-CM | POA: Diagnosis not present

## 2018-11-10 DIAGNOSIS — I10 Essential (primary) hypertension: Secondary | ICD-10-CM | POA: Diagnosis not present

## 2018-11-10 DIAGNOSIS — Z299 Encounter for prophylactic measures, unspecified: Secondary | ICD-10-CM | POA: Diagnosis not present

## 2018-11-10 DIAGNOSIS — I4891 Unspecified atrial fibrillation: Secondary | ICD-10-CM | POA: Diagnosis not present

## 2018-11-10 DIAGNOSIS — Z6829 Body mass index (BMI) 29.0-29.9, adult: Secondary | ICD-10-CM | POA: Diagnosis not present

## 2018-11-12 DIAGNOSIS — E1142 Type 2 diabetes mellitus with diabetic polyneuropathy: Secondary | ICD-10-CM | POA: Diagnosis not present

## 2018-11-12 DIAGNOSIS — Z6829 Body mass index (BMI) 29.0-29.9, adult: Secondary | ICD-10-CM | POA: Diagnosis not present

## 2018-11-12 DIAGNOSIS — Z299 Encounter for prophylactic measures, unspecified: Secondary | ICD-10-CM | POA: Diagnosis not present

## 2018-11-12 DIAGNOSIS — I10 Essential (primary) hypertension: Secondary | ICD-10-CM | POA: Diagnosis not present

## 2018-11-12 DIAGNOSIS — E1165 Type 2 diabetes mellitus with hyperglycemia: Secondary | ICD-10-CM | POA: Diagnosis not present

## 2018-11-14 NOTE — Progress Notes (Addendum)
Cardiology Office Note  Date: 11/15/2018   ID: Brett Matthews, DOB 1932/08/18, MRN [id number]  PCP:  Glenda Chroman, MD  Cardiologist:  Rozann Lesches, MD Electrophysiologist:  None   Chief Complaint  Patient presents with  . Follow-up recent hospital stay for hypertension and rapid HR    History of Present Illness: Brett Matthews is an 85 y.o. male last seen in September 2019. Patient  Recently had some CP on 11/04/2018 and C/O feeling weak and lightheaded with decreased BP. He was told to hold his amlodipine and carvedilol.  He was admitted for recent hospital stay for HTN and Rapid HR  at [facility].  Patient initially showed SVT with a rate of 149 with low blood pressure.  He was given adenosine twice with no resolution.  Subsequently given Cardizem IV bolus followed by Cardizem drip and placed in ICU.  He was subsequently discharged after a 4-day stay and Cardizem p.o. was ordered.  Patient was ruled out for acute coronary syndrome with negative troponins.  Initial EKG demonstrated SVT.  Subsequent EKG demonstrated atrial flutter with variable block.  Patient states he has been taken off two anti-hypertensive medications recently. He was written a prescription for Cardizem upon D/C from recent hospitalization and will pick that up today. BP on arrival was 163 /85. Recheck in  L arm 140 / 87.   Patient states he had a recent kyphoplasty and recently fell from a truck and hurt his back. States pain is left sided with radiation / sciatic like pain  down left buttocks and is taking prescribed analgesics for pain which provide little relief. States he plans to have left shoulder surgery in the near future.   He denies any orthostatic symptoms such as syncope near syncope lightheadedness or dizziness since stopping previous anti-hypertensive medications.   Patient is uncertain as to which antihypertensive medications he is taking.  Nursing staff called pharmacy to reconcile  medication.  Advised patient to bring his medication bottles in tomorrow in order to reconcile.    Past Medical History:  Diagnosis Date  . Anemia, iron deficiency    Negative capsule endoscopy  . Arthritis   . Coronary atherosclerosis of native coronary artery    60 % circumflex stenosis; EF 65%, Cath 6/13  . Diverticula of colon    Pancolonic  . DM (diabetes mellitus), type 2, uncontrolled ([place])   . Dyspnea    Normal cardiopulmonary function test in July 2008, negative echocardiogram with "bubble" study for inter-cardiac shunt.  . Essential hypertension, benign   . Gastroesophageal reflux disease   . Hemorrhoids   . Hyperplastic colon polyp 04/06/2007  . Hypothyroidism 2003   Status post total thyroidectomy  . Migraines   . Neoplasm of lymphatic and hematopoietic tissue   . Paroxysmal atrial fibrillation ([place])    Diagnosed 2005  . Skin cancer     Past Surgical History:  Procedure Laterality Date  . BACK SURGERY    . BONE MARROW BIOPSY  1990's  . CATARACT EXTRACTION Bilateral   . COLONOSCOPY  04/06/2007   Dr. Gala Romney- Normal rectum with scattered pancolonic diverticula and slightly redundant elongated colon, diminutive polpy midsigmoid, remainder of colonic mucosa appeared normal. bx= hyperplastic polyp  . COLONOSCOPY N/A 11/29/2012   VOH:[id number] preparation. Friable anal canal/internal hemorrhoids; otherwise, normal rectum. Normal-appearing colonic mucosa  . COLONOSCOPY WITH PROPOFOL N/A 03/12/2017   Procedure: COLONOSCOPY WITH PROPOFOL;  Surgeon: Daneil Dolin, MD;  Location: [facility];  Service:  Endoscopy;  Laterality: N/A;  7:30am  . ESOPHAGOGASTRODUODENOSCOPY  04/06/2007   Dr. Gala Romney- normal esophagus s/p nissen fundoplication, intact nissen wrap o/w normal stomach  . ESOPHAGOGASTRODUODENOSCOPY N/A 11/29/2012   RMR: Abnormall distal esophagus bx c/w GERD. prior fundoplication. Gastric polyps  -bx benign. Status post biopsy of normal--appearing duodenal mucosa (bx neg)   . HEMORRHOID SURGERY N/A 04/06/2017   Procedure: EXTENSIVE HEMORRHOIDECTOMY;  Surgeon: Aviva Signs, MD;  Location: [facility] ORS;  Service: General;  Laterality: N/A;  . LEFT HEART CATHETERIZATION WITH CORONARY ANGIOGRAM N/A 06/27/2011   Procedure: LEFT HEART CATHETERIZATION WITH CORONARY ANGIOGRAM;  Surgeon: Thayer Headings, MD;  Location: [facility] CATH LAB;  Service: Cardiovascular;  Laterality: N/A;  . NISSEN FUNDOPLICATION    . POLYPECTOMY  03/12/2017   Procedure: POLYPECTOMY;  Surgeon: Daneil Dolin, MD;  Location: [facility];  Service: Endoscopy;;  cecal x3  . POSTERIOR LAMINECTOMY / DECOMPRESSION LUMBAR SPINE  ~ 1990's  . SKIN CANCER EXCISION     "off my back and chest; from sun"    Current Outpatient Medications  Medication Sig Dispense Refill  . benazepril (LOTENSIN) 40 MG tablet Take 40 mg by mouth daily.    . clonazePAM (KLONOPIN) 0.5 MG tablet Take 1 tablet by mouth at bedtime.   3  . diltiazem (CARDIZEM CD) 180 MG 24 hr capsule Take 180 mg by mouth daily.    . famotidine (PEPCID) 40 MG tablet Take 40 mg by mouth daily.    Marland Kitchen gabapentin (NEURONTIN) 300 MG capsule Take 2 capsules by mouth 2 (two) times daily.    Marland Kitchen levothyroxine (SYNTHROID) 125 MCG tablet Take 125 mcg by mouth daily before breakfast. SYNTHROID - brand name only    . metFORMIN (GLUCOPHAGE) 500 MG tablet Take 2 tabs (1,021m) by mouth every morning & 1 tab (5075m every evening     . metoprolol succinate (TOPROL-XL) 50 MG 24 hr tablet Take 50 mg by mouth 2 (two) times daily.    . Oxycodone HCl 10 MG TABS Take 10 mg by mouth 3 (three) times daily as needed.    . pantoprazole (PROTONIX) 40 MG tablet Take 40 mg by mouth daily.      . rivaroxaban (XARELTO) 20 MG TABS tablet Take 20 mg by mouth daily with supper.    . tamsulosin (FLOMAX) 0.4 MG CAPS capsule Take 0.4 mg by mouth daily after supper.     . Marland KitchenmLODipine (NORVASC) 10 MG tablet Take 10 mg by mouth daily.      . Calcium Polycarbophil (FIBER-CAPS PO) Take 1 capsule by mouth  2 (two) times daily.    . citalopram (CELEXA) 20 MG tablet Take 1 tablet by mouth daily.  3  . DYMISTA 137-50 MCG/ACT SUSP Place 1 spray into the nose daily.     . folic acid (FOLVITE) 1 MG tablet Take 1 tablet by mouth daily.  2  . glimepiride (AMARYL) 2 MG tablet Take 2 mg by mouth 2 (two) times daily.  9  . neomycin-polymyxin-dexameth (MAXITROL) 0.1 % OINT Place 1 application into the right eye at bedtime.    . nitroGLYCERIN (NITROSTAT) 0.4 MG SL tablet Place 0.4 mg under the tongue every 5 (five) minutes as needed for chest pain.     No current facility-administered medications for this visit.    Allergies:  Patient has no known allergies.   Social History: The patient  reports that he has never smoked. He has never used smokeless tobacco. He reports that he does not  drink alcohol or use drugs.   Family History: The patient's family history includes Cancer in an other family member; Stroke in an other family member.   ROS:  Please see the history of present illness. Otherwise, complete review of systems is positive for none.  All other systems are reviewed and negative.   Physical Exam: VS:  BP 140/87   Pulse 67   Ht _0  (1.753 m)   Wt 203 lb 6.4 oz (92.3 kg)   SpO2 97%   BMI 30.04 kg/m , BMI Body mass index is 30.04 kg/m.  Wt Readings from Last 3 Encounters:  11/15/18 203 lb 6.4 oz (92.3 kg)  09/16/17 202 lb (91.6 kg)  04/14/17 204 lb (92.5 kg)    General: Patient appears comfortable at rest. HEENT: Conjunctiva and lids normal, oropharynx clear with moist mucosa. Neck: Supple, no elevated JVP or carotid bruits, no thyromegaly. Lungs: Clear to auscultation, nonlabored breathing at rest. Cardiac: Regular rate and rhythm, no S3 or significant systolic murmur, no pericardial rub. Abdomen: Soft, nontender, no hepatomegaly, bowel sounds present, no guarding or rebound. Extremities: No pitting edema, distal pulses 2+. Skin: Warm and dry. Musculoskeletal: No kyphosis.  Neuropsychiatric: Alert and oriented x3, affect grossly appropriate.  ECG:  An ECG dated 08/07/2017 was personally reviewed today and demonstrated:  Sinus rhythm with PACs, left anterior fascicular block, nonspecific ST changes.  Recent Labwork: Recent lab work from hospital stay 11/05/2018; hemoglobin 13.4, hematocrit 42.7, magnesium 1.5, troponin T x3 less than 0.01, TSH 2.41 creatinine 0.89 GFR greater than 60.  04/08/2017: ALT 17; AST 17; BUN 8; Creatinine, Ser 0.80; Hemoglobin 12.0; Platelets 284; Potassium 3.8; Sodium 138   Other Studies Reviewed Today:  Echocardiogram from [facility] 12/20/2012: Mild LVH with LVEF 67-20%, diastolic dysfunction, mild left atrial enlargement, upper normal RV chamber size, MAC with mild mitral regurgitation, sclerotic aortic valve with trace aortic regurgitation, mild tricuspid regurgitation with RVSP 25-35 mmHg.  Assessment and Plan:  1.  Paroxysmal atrial fibrillation.  Recent hospital stay November 05, 2018 for atrial fibrillation with RVR/evidence of period of SVT and atrial flutter with variable block via serial EKGs.  Resolved prior to discharge.  Xarelto for stroke prophylaxis.   Heart rate today is regular and strong 2+ pulses bilaterally rate of 67.  2.  History of moderate CAD involving the circumflex without active angina symptoms.  He is on beta-blocker and Norvasc.  Also Lipitor with follow-up per Dr. Woody Seller.  3.  Essential hypertension.  Patient was recently in local hospital for hypertension and rapid heart rate.  Patient states he stayed for 4 days and was discharged.  Patient is uncertain as to which antihypertensive medications he is taking.  Asked patient to bring his medication bottles in tomorrow in order for [place] to reconcile.   4. Chest pain. Patient had episodes of CP around October 29 th at PCP office with feeling of weakness and dizziness SBP was in the  90's.  He was advised by PCP to go to ER due to dx of rapid atrial  fibrillation. He refused. The following day he  presented to PCP with same complaints. He drove himself to the emergency room.  He was told to hold amlodipine and carvedilol.  Patient denies any further episodes of chest pain or other anginal / exertional type symptoms.   Medication Adjustments/Labs and Tests Ordered:  Tests Ordered: No orders of the defined types were placed in this encounter.   Medication Changes: No orders of  the defined types were placed in this encounter.   Disposition:  Follow up in 6 month(s)  Signed, Katina Dung NP 11/15/2018 4:46 PM    [facility] [place] [facility] [place], [place], [place] [address] Phone: [phone]; Fax: [phone]

## 2018-11-15 ENCOUNTER — Ambulatory Visit (INDEPENDENT_AMBULATORY_CARE_PROVIDER_SITE_OTHER): Payer: PPO | Admitting: Cardiology

## 2018-11-15 ENCOUNTER — Encounter: Payer: Self-pay | Admitting: *Deleted

## 2018-11-15 ENCOUNTER — Encounter: Payer: Self-pay | Admitting: Cardiology

## 2018-11-15 ENCOUNTER — Other Ambulatory Visit: Payer: Self-pay

## 2018-11-15 VITALS — BP 140/87 | HR 67 | Ht 69.0 in | Wt 203.4 lb

## 2018-11-15 DIAGNOSIS — I480 Paroxysmal atrial fibrillation: Secondary | ICD-10-CM | POA: Diagnosis not present

## 2018-11-15 DIAGNOSIS — R079 Chest pain, unspecified: Secondary | ICD-10-CM | POA: Diagnosis not present

## 2018-11-15 DIAGNOSIS — I2510 Atherosclerotic heart disease of native coronary artery without angina pectoris: Secondary | ICD-10-CM | POA: Diagnosis not present

## 2018-11-15 DIAGNOSIS — I10 Essential (primary) hypertension: Secondary | ICD-10-CM

## 2018-11-15 DIAGNOSIS — S46012D Strain of muscle(s) and tendon(s) of the rotator cuff of left shoulder, subsequent encounter: Secondary | ICD-10-CM | POA: Diagnosis not present

## 2018-11-15 NOTE — Patient Instructions (Addendum)
Medication Instructions:   Bring your medications to the office tomorrow for review  Labwork:  NONE  Testing/Procedures:  NONE  Follow-Up:  Your physician recommends that you schedule a follow-up appointment in: 6 months. You will receive a reminder letter in the mail in about 4 months reminding you to call and schedule your appointment. If you don't receive this letter, please contact our office.  Any Other Special Instructions Will Be Listed Below (If Applicable).  If you need a refill on your cardiac medications before your next appointment, please call your pharmacy.

## 2018-11-16 ENCOUNTER — Encounter: Payer: Self-pay | Admitting: *Deleted

## 2018-11-16 DIAGNOSIS — I10 Essential (primary) hypertension: Secondary | ICD-10-CM | POA: Diagnosis not present

## 2018-11-16 DIAGNOSIS — Z6830 Body mass index (BMI) 30.0-30.9, adult: Secondary | ICD-10-CM | POA: Diagnosis not present

## 2018-11-16 DIAGNOSIS — Z713 Dietary counseling and surveillance: Secondary | ICD-10-CM | POA: Diagnosis not present

## 2018-11-16 DIAGNOSIS — Z299 Encounter for prophylactic measures, unspecified: Secondary | ICD-10-CM | POA: Diagnosis not present

## 2018-11-16 NOTE — Addendum Note (Signed)
Addended by: Merlene Laughter on: 11/16/2018 11:58 AM   Modules accepted: Orders

## 2018-11-16 NOTE — Progress Notes (Signed)
Medications reviewed and discussed with Jonni Sanger. BP 172/90 HR 56. No changes for now per Jonni Sanger since patient reported that he started cardizem cd 240 mg on yesterday. Advised to continue monitoring BP at home.

## 2018-11-19 DIAGNOSIS — Z713 Dietary counseling and surveillance: Secondary | ICD-10-CM | POA: Diagnosis not present

## 2018-11-19 DIAGNOSIS — Z299 Encounter for prophylactic measures, unspecified: Secondary | ICD-10-CM | POA: Diagnosis not present

## 2018-11-19 DIAGNOSIS — I10 Essential (primary) hypertension: Secondary | ICD-10-CM | POA: Diagnosis not present

## 2018-11-19 DIAGNOSIS — I4891 Unspecified atrial fibrillation: Secondary | ICD-10-CM | POA: Diagnosis not present

## 2018-11-19 DIAGNOSIS — Z6830 Body mass index (BMI) 30.0-30.9, adult: Secondary | ICD-10-CM | POA: Diagnosis not present

## 2018-11-22 DIAGNOSIS — I10 Essential (primary) hypertension: Secondary | ICD-10-CM | POA: Diagnosis not present

## 2018-11-23 ENCOUNTER — Telehealth: Payer: Self-pay | Admitting: Cardiology

## 2018-11-23 NOTE — Telephone Encounter (Signed)
Contacted patient to give below plan and saw in the medication reconciliation section of chart that a new rx for carvedilol 12.5 mg that was dispensed on 11/19/2018. Contacted Dr. Woody Seller office to get clarification. Per Pam at Dr. Woody Seller office, patient was seen on 11/19/2018 by Dr. Woody Seller and BP was 160/80. The following changes were made to medications per Pam: stopped Toprol XL 50 mg and started carvedilol 12.5 mg taking 1&1/2 (18.75 mg) BID and advised to continue checking home BP's.  Medication profile updated with these changes.

## 2018-11-23 NOTE — Telephone Encounter (Signed)
He was just seen in the office recently, medications reconciled after recent changes.  According to chart he is now on Cardizem CD 240 mg daily, Toprol-XL 50 mg twice daily, and Lotensin 40 mg daily.  Please start chlorthalidone 25 mg daily with follow-up BMET in 10 days.  Continue to track blood pressure, recommend that he see his PCP as well since they can also continue to help with his blood pressure control.

## 2018-11-23 NOTE — Telephone Encounter (Signed)
Reports blood pressure continues to be high. BP today is 158/96. Reports that his BP is in this range ever day. Reports that his home BP monitor has been checked for accuracy and is accurate. Medications reviewed.

## 2018-11-23 NOTE — Telephone Encounter (Signed)
Patient wanting to see Dr Domenic Polite about his BP remaining high and not coming down

## 2018-11-23 NOTE — Telephone Encounter (Signed)
Noted, thank you for clarifying this.  I did not realize that his medications were just changed a few days ago.  Do not add the chlorthalidone that I suggested.  He really needs to work with one provider so that the situation is not further confused.  Please ask him to continue to follow with Dr. Woody Seller for management of his antihypertensives.

## 2018-11-23 NOTE — Telephone Encounter (Signed)
Patient informed and verbalized understanding of plan. 

## 2018-11-24 DIAGNOSIS — M47816 Spondylosis without myelopathy or radiculopathy, lumbar region: Secondary | ICD-10-CM | POA: Diagnosis not present

## 2018-11-24 DIAGNOSIS — S32010A Wedge compression fracture of first lumbar vertebra, initial encounter for closed fracture: Secondary | ICD-10-CM | POA: Diagnosis not present

## 2018-11-24 DIAGNOSIS — Z9889 Other specified postprocedural states: Secondary | ICD-10-CM | POA: Diagnosis not present

## 2018-11-24 DIAGNOSIS — M5416 Radiculopathy, lumbar region: Secondary | ICD-10-CM | POA: Diagnosis not present

## 2018-11-24 DIAGNOSIS — M5136 Other intervertebral disc degeneration, lumbar region: Secondary | ICD-10-CM | POA: Diagnosis not present

## 2018-11-25 DIAGNOSIS — I10 Essential (primary) hypertension: Secondary | ICD-10-CM | POA: Diagnosis not present

## 2018-11-25 DIAGNOSIS — Z6829 Body mass index (BMI) 29.0-29.9, adult: Secondary | ICD-10-CM | POA: Diagnosis not present

## 2018-11-25 DIAGNOSIS — Z299 Encounter for prophylactic measures, unspecified: Secondary | ICD-10-CM | POA: Diagnosis not present

## 2018-11-25 DIAGNOSIS — Z713 Dietary counseling and surveillance: Secondary | ICD-10-CM | POA: Diagnosis not present

## 2018-11-29 DIAGNOSIS — H6123 Impacted cerumen, bilateral: Secondary | ICD-10-CM | POA: Diagnosis not present

## 2018-11-30 ENCOUNTER — Other Ambulatory Visit: Payer: Self-pay | Admitting: Neurosurgery

## 2018-11-30 DIAGNOSIS — M5416 Radiculopathy, lumbar region: Secondary | ICD-10-CM

## 2018-12-01 DIAGNOSIS — Z6830 Body mass index (BMI) 30.0-30.9, adult: Secondary | ICD-10-CM | POA: Diagnosis not present

## 2018-12-01 DIAGNOSIS — Z299 Encounter for prophylactic measures, unspecified: Secondary | ICD-10-CM | POA: Diagnosis not present

## 2018-12-01 DIAGNOSIS — I10 Essential (primary) hypertension: Secondary | ICD-10-CM | POA: Diagnosis not present

## 2018-12-01 DIAGNOSIS — Z713 Dietary counseling and surveillance: Secondary | ICD-10-CM | POA: Diagnosis not present

## 2018-12-06 ENCOUNTER — Ambulatory Visit: Payer: PPO | Admitting: Cardiology

## 2018-12-06 ENCOUNTER — Other Ambulatory Visit: Payer: Self-pay

## 2018-12-06 ENCOUNTER — Encounter: Payer: Self-pay | Admitting: Cardiology

## 2018-12-06 VITALS — BP 162/95 | HR 75 | Temp 97.8°F | Ht 69.0 in | Wt 208.7 lb

## 2018-12-06 DIAGNOSIS — I10 Essential (primary) hypertension: Secondary | ICD-10-CM

## 2018-12-06 DIAGNOSIS — I480 Paroxysmal atrial fibrillation: Secondary | ICD-10-CM | POA: Diagnosis not present

## 2018-12-06 DIAGNOSIS — E119 Type 2 diabetes mellitus without complications: Secondary | ICD-10-CM | POA: Diagnosis not present

## 2018-12-06 DIAGNOSIS — E782 Mixed hyperlipidemia: Secondary | ICD-10-CM

## 2018-12-06 MED ORDER — SPIRONOLACTONE 25 MG PO TABS: 25.0000 mg | ORAL_TABLET | Freq: Every day | ORAL | 2 refills | Status: DC

## 2018-12-06 NOTE — Progress Notes (Signed)
Primary Physician/Referring:  Glenda Chroman, MD  Patient ID: Brett Matthews, male    DOB: 1932-12-03, 85 y.o.   MRN: [id number]   Chief Complaint  Patient presents with   New Patient (Initial Visit)   Hypertension   Atrial Fibrillation   HPI:    Brett Matthews  is a 85 y.o.  Caucasian male referred to me for evaluation of new onset atrial fibrillation with rapid ventricular response and also  Resistant hypertension. His past medical history significant for hyperlipidemia, controlled diabetes mellitus, moderate coronary artery disease in mid circumflex with 60% stenosis by angiography in 2013.  Patient presented to [facility] at [place], [place] on 11/08/2018 with chest tightness, marked generalized weakness and also palpitations, he was found to be in atrial fibrillation with rapid ventricular response.  He was kept overnight and eventually discharged home with recommendation to follow-up with outpatient basis.  His labs including cardiac markers were negative for myocardial injury.  He now presents to establish care.He has been evaluated by Dr. Rozann Lesches In the past.  Past Medical History:  Diagnosis Date   Anemia, iron deficiency    Negative capsule endoscopy   Arthritis    Coronary atherosclerosis of native coronary artery    60 % circumflex stenosis; EF 65%, Cath 6/13   Diverticula of colon    Pancolonic   DM (diabetes mellitus), type 2, uncontrolled ([place])    Dyspnea    Normal cardiopulmonary function test in July 2008, negative echocardiogram with "bubble" study for inter-cardiac shunt.   Essential hypertension, benign    Gastroesophageal reflux disease    Hemorrhoids    Hyperplastic colon polyp 04/06/2007   Hypothyroidism 2003   Status post total thyroidectomy   Migraines    Neoplasm of lymphatic and hematopoietic tissue    Paroxysmal atrial fibrillation ([place])    Diagnosed 2005   Skin cancer    Past Surgical History:  Procedure Laterality Date     BACK SURGERY     BONE MARROW BIOPSY  1990's   CARDIAC CATHETERIZATION     CATARACT EXTRACTION Bilateral    COLONOSCOPY  04/06/2007   Dr. Gala Romney- Normal rectum with scattered pancolonic diverticula and slightly redundant elongated colon, diminutive polpy midsigmoid, remainder of colonic mucosa appeared normal. bx= hyperplastic polyp   COLONOSCOPY N/A 11/29/2012   XAJ:OINOMVEH preparation. Friable anal canal/internal hemorrhoids; otherwise, normal rectum. Normal-appearing colonic mucosa   COLONOSCOPY WITH PROPOFOL N/A 03/12/2017   Procedure: COLONOSCOPY WITH PROPOFOL;  Surgeon: Daneil Dolin, MD;  Location: [facility];  Service: Endoscopy;  Laterality: N/A;  7:30am   ESOPHAGOGASTRODUODENOSCOPY  04/06/2007   Dr. Gala Romney- normal esophagus s/p nissen fundoplication, intact nissen wrap o/w normal stomach   ESOPHAGOGASTRODUODENOSCOPY N/A 11/29/2012   RMR: Abnormall distal esophagus bx c/w GERD. prior fundoplication. Gastric polyps  -bx benign. Status post biopsy of normal--appearing duodenal mucosa (bx neg)   HEMORRHOID SURGERY N/A 04/06/2017   Procedure: EXTENSIVE HEMORRHOIDECTOMY;  Surgeon: Aviva Signs, MD;  Location: [facility] OR[facility];  Service: General;  Laterality: N/A;   LEFT HEART CATHETERIZATION WITH CORONARY ANGIOGRAM N/A 06/27/2011   Procedure: LEFT HEART CATHETERIZATION WITH CORONARY ANGIOGRAM;  Surgeon: Thayer Headings, MD;  Location: [facility] CATH LAB;  Service: Cardiovascular;  Laterality: N/A;   NISSEN FUNDOPLICATION     POLYPECTOMY  03/12/2017   Procedure: POLYPECTOMY;  Surgeon: Daneil Dolin, MD;  Location: [facility];  Service: Endoscopy;;  cecal x3   POSTERIOR LAMINECTOMY / DECOMPRESSION LUMBAR SPINE  ~ 1990's  SKIN CANCER EXCISION     "off my back and chest; from sun"   Social History   Socioeconomic History   Marital status: Divorced    Spouse name: Not on file   Number of children: 2   Years of education: Not on file   Highest education level: Not on file   Occupational History   Occupation: Full Time  Social Designer, fashion/clothing strain: Not on file   Food insecurity    Worry: Not on file    Inability: Not on file   Transportation needs    Medical: Not on file    Non-medical: Not on file  Tobacco Use   Smoking status: Never Smoker   Smokeless tobacco: Never Used  Substance and Sexual Activity   Alcohol use: No   Drug use: No   Sexual activity: Never  Lifestyle   Physical activity    Days per week: Not on file    Minutes per session: Not on file   Stress: Not on file  Relationships   Social connections    Talks on phone: Not on file    Gets together: Not on file    Attends religious service: Not on file    Active member of club or organization: Not on file    Attends meetings of clubs or organizations: Not on file    Relationship status: Not on file   Intimate partner violence    Fear of current or ex partner: Not on file    Emotionally abused: Not on file    Physically abused: Not on file    Forced sexual activity: Not on file  Other Topics Concern   Not on file  Social History Narrative   Not on file   ROS  Review of Systems  Constitution: Negative for chills, decreased appetite, malaise/fatigue and weight gain.  Cardiovascular: Positive for palpitations. Negative for dyspnea on exertion, leg swelling and syncope.  Endocrine: Negative for cold intolerance.  Hematologic/Lymphatic: Does not bruise/bleed easily.  Musculoskeletal: Positive for back pain. Negative for joint swelling.  Gastrointestinal: Negative for abdominal pain, anorexia, change in bowel habit, hematochezia and melena.  Neurological: Negative for headaches and light-headedness.  Psychiatric/Behavioral: Negative for depression and substance abuse.  All other systems reviewed and are negative.  Objective   Vitals with BMI 12/06/2018 11/15/2018 11/15/2018  Height _0  - _1   Weight 208 lbs 11 oz - 203 lbs 6 oz  BMI 85.88 -  50.27  Systolic 741 287 867  Diastolic 95 87 85  Pulse 75 - 67      Physical Exam  Constitutional:  He is well-built and mildly obese in no acute distress.  HENT:  Head: Atraumatic.  Eyes: Conjunctivae are normal.  Neck: Neck supple. No JVD present. No thyromegaly present.  Cardiovascular: Normal rate, regular rhythm, normal heart sounds and intact distal pulses. Exam reveals no gallop.  No murmur heard. No leg edema, no JVD.  Pulmonary/Chest: Effort normal and breath sounds normal.  Abdominal: Soft. Bowel sounds are normal.  obese  Musculoskeletal: Normal range of motion.  Neurological: He is alert.  Skin: Skin is warm and dry.  Psychiatric: He has a normal mood and affect.   Laboratory examination:   No results for input(s): NA, K, CL, CO2, GLUCOSE, BUN, CREATININE, CALCIUM, GFRNONAA, GFRAA in the last 8760 hours. CrCl cannot be calculated (Patient's most recent lab result is older than the maximum 21 days allowed.).  CMP Latest Ref Rng &  Units 04/08/2017 04/02/2017 02/26/2017  Glucose 65 - 99 mg/dL 91 98 130(H)  BUN 6 - 20 mg/dL _0 Creatinine 0.61 - 1.24 mg/dL 0.80 0.89 0.84  Sodium 135 - 145 mmol/L 138 137 135  Potassium 3.5 - 5.1 mmol/L 3.8 3.9 3.8  Chloride 101 - 111 mmol/L 105 103 102  CO2 22 - 32 mmol/L _1 Calcium 8.9 - 10.3 mg/dL 9.8 9.6 9.3  Total Protein 6.5 - 8.1 g/dL 7.3 - -  Total Bilirubin 0.3 - 1.2 mg/dL 0.5 - -  Alkaline Phos 38 - 126 U/L 86 - -  AST 15 - 41 U/L 17 - -  ALT 17 - 63 U/L 17 - -   CBC Latest Ref Rng & Units 04/08/2017 04/02/2017 03/02/2017  WBC 4.0 - 10.5 K/uL 7.0 6.2 7.1  Hemoglobin 13.0 - 17.0 g/dL 12.0(L) 12.0(L) 12.4(L)  Hematocrit 39.0 - 52.0 % 37.5(L) 37.7(L) 37.3(L)  Platelets 150 - 400 K/uL 284 331 303   Lipid Panel     Component Value Date/Time   CHOL 184 06/27/2011 0605   TRIG 186 (H) 06/27/2011 0605   HDL 44 06/27/2011 0605   CHOLHDL 4.2 06/27/2011 0605   VLDL 37 06/27/2011 0605   LDLCALC 103 (H) 06/27/2011 0605    HEMOGLOBIN A1C Lab Results  Component Value Date   HGBA1C 6.4 (H) 04/02/2017   MPG 136.98 04/02/2017   TSH No results for input(s): TSH in the last 8760 hours.   Labs 11/08/2018: Serum glucose 149 mg, BUN 16, creatinine 0.89, eGFR >60 mL.  CBC normal.  Medications and allergies  No Known Allergies   Current Outpatient Medications  Medication Instructions   benazepril (LOTENSIN) 40 mg, Oral, Daily   carvedilol (COREG) 25 mg, Oral, 2 times daily with meals   chlorthalidone (HYGROTON) 25 mg, Oral, Daily   citalopram (CELEXA) 20 MG tablet 1 tablet, Oral, Daily   clonazePAM (KLONOPIN) 0.5 MG tablet 1 tablet, Oral, Daily at bedtime   cloNIDine (CATAPRES) 0.1 mg, Oral, When top blood pressure >170   diltiazem (CARDIZEM CD) 240 mg, Oral, Daily   docusate sodium (COLACE) 100 mg, Oral, Daily PRN   famotidine (PEPCID) 40 mg, Oral, Daily   fluticasone (FLONASE) 50 MCG/ACT nasal spray 1 spray, Each Nare, 2 times daily   folic acid (FOLVITE) 1 MG tablet 1 tablet, Oral, Daily   gabapentin (NEURONTIN) 300 MG capsule 2 capsules, Oral, 2 times daily   glimepiride (AMARYL) 2 mg, Oral, 2 times daily   levothyroxine (SYNTHROID) 125 mcg, Oral, Daily before breakfast, SYNTHROID - brand name only    LINZESS 290 MCG CAPS capsule 1 capsule, Oral, Daily   metFORMIN (GLUCOPHAGE) 500 MG tablet Take 2 tabs (1,025m) by mouth every morning & 1 tab (5057m every evening   Omega-3 Fatty Acids (FISH OIL) 500 MG CAPS Oral, 3 daily    pantoprazole (PROTONIX) 40 mg, Daily   rivaroxaban (XARELTO) 20 mg, Oral, Daily with supper   spironolactone (ALDACTONE) 25 mg, Oral, Daily   tamsulosin (FLOMAX) 0.4 mg, Oral, Daily after supper   Radiology:   Chest x-ray PA and lateral view 11/08/2018: Normal chest x-ray with no active lung disease.  Cardiac Studies:   Coronary  angiogram 06/27/2011: Mild left main disease, mid circumflex 60% stenosis.  Mild luminal irregularity of the RCA and normal  LAD.  LVEF 65%.  [facility]: Mild LVH with LVEF 6002-58%diastolic dysfunction, mild left atrial enlargement, upper normal RV chamber  size, MAC with mild mitral regurgitation, sclerotic aortic valve with trace aortic regurgitation, mild tricuspid regurgitation with RVSP 25-35 mmHg.  Assessment     ICD-10-CM   1. Paroxysmal atrial fibrillation (HCC)  I48.0 EKG 12-Lead    PCV MYOCARDIAL PERFUSION WITH LEXISCAN    PCV ECHOCARDIOGRAM COMPLETE   CHA2DS2-VASc Score is 4.  Yearly risk of stroke: 4% (A, DM, HTN).  2. Resistant hypertension  I10 chlorthalidone (HYGROTON) 25 MG tablet    cloNIDine (CATAPRES) 0.1 MG tablet    Aldosterone + renin activity w/ ratio  3. Essential hypertension, benign  I10 EKG 99-JTTS    Basic Metabolic Panel (BMET)    PCV ECHOCARDIOGRAM COMPLETE  4. Controlled type 2 diabetes mellitus without complication, without long-term current use of insulin (HCC)  E11.9 PCV MYOCARDIAL PERFUSION WITH LEXISCAN  5. Mixed hyperlipidemia  E78.2 Omega-3 Fatty Acids (FISH OIL) 500 MG CAPS    TSH    Lipid Panel With LDL/HDL Ratio    LDL cholesterol, direct    EKG 12/06/2018: Normal sinus rhythm with sinus arrhythmia at the rate of 70 bpm, left axis deviation, left anterior fascicular block.  Poor R-wave progression, consider pulmonary disease pattern.  Nonspecific IVCD, borderline criteria for LVH.  Normal QT interval.  EKG 11/05/2018: Atrial fibrillation with rapid ventricular response at the rate of 110 bpm, left axis deviation, left plantar fascicular block.  Poor R-wave progression, cannot exclude anterolateral infarct old.  Nonspecific T abnormality.  EKG 11/04/2021 minutes before: SVT, probably atrial flutter with 2:1 conduction, ventricular rate 150 bpm.  Recommendations:   Meds ordered this encounter  Medications   DISCONTD: spironolactone (ALDACTONE) 25 MG tablet    Sig: Take 1 tablet (25 mg total) by mouth daily.    Dispense:   30 tablet    Refill:  2    Brett Matthews  is a 85 y.o.   Caucasian male referred to me for evaluation of new onset atrial fibrillation with rapid ventricular response and also  Resistant hypertension. His past medical history significant for hyperlipidemia, controlled diabetes mellitus, moderate coronary artery disease in mid circumflex with 60% stenosis by angiography in 2013.  Patient presented to [facility] at [place], [place] on 11/08/2018 with chest tightness, marked generalized weakness and also palpitations, he was found to be in atrial fibrillation with rapid ventricular response.  He was kept overnight and eventually discharged home with recommendation to follow-up with outpatient basis.  Patient presenting with new onset of atrial fibrillation with RVR, he is back in sinus rhythm.  Continue anti-correlation in view of high cardioembolic risk.  Needs cardiac ischemic workup, an echocardiogram and Lexiscan Myoview stress test has been ordered.  In view of Covid 19, treadmill stress test is not being performed.  I have added Aldactone 25 mg daily, I will obtain BMP in 10 days to 2 weeks and also aldosterone to renin activity ratio to evaluate for secondary causes of hypertension.  He may need renal angiography or MRA to exclude renal artery stenosis.  I'd like to see him back in 4-6 weeks for follow-up.   I do not have his lipid profile, in view of underlying CAD and CVA risk, respectively needs to be on a statin, will address on this on his next office visit.  "Total time spent with patient was 70 minutes and greater than 50% of that time was spent in counseling and coordination care with the patient  regarding his recent hospitalization, arrhythmia management and lifestyle modification for obesity and  hypertension"    Adrian Prows, MD, [facility] 12/06/2018, 10:48 PM [facility] [place] Pager: [phone] Office: [phone] If no answer Cell [phone]

## 2018-12-06 NOTE — Patient Instructions (Signed)
Avoid fried food, Salt, Pickles, pork, dips and salsa.   Cut your food intake by 10-20%.  Follow [facility] Low Glycemic (low sugar) diet sheet.   Blood work in 2 weeks at [facility].

## 2018-12-07 ENCOUNTER — Telehealth: Payer: Self-pay

## 2018-12-07 NOTE — Telephone Encounter (Signed)
Pt aware and I have sent him a code for [employer]

## 2018-12-07 NOTE — Telephone Encounter (Signed)
-----   Message from Adrian Prows, MD sent at 12/06/2018 10:55 PM EST ----- Regarding: Labs Please call and advice patient that he needs to do the labs fasting in 1 week to 10 days. To sign up for access to My Chart also.  JG

## 2018-12-08 ENCOUNTER — Other Ambulatory Visit: Payer: PPO

## 2018-12-08 DIAGNOSIS — Z0000 Encounter for general adult medical examination without abnormal findings: Secondary | ICD-10-CM | POA: Diagnosis not present

## 2018-12-08 DIAGNOSIS — E7800 Pure hypercholesterolemia, unspecified: Secondary | ICD-10-CM | POA: Diagnosis not present

## 2018-12-08 DIAGNOSIS — Z1211 Encounter for screening for malignant neoplasm of colon: Secondary | ICD-10-CM | POA: Diagnosis not present

## 2018-12-08 DIAGNOSIS — Z1339 Encounter for screening examination for other mental health and behavioral disorders: Secondary | ICD-10-CM | POA: Diagnosis not present

## 2018-12-08 DIAGNOSIS — Z79899 Other long term (current) drug therapy: Secondary | ICD-10-CM | POA: Diagnosis not present

## 2018-12-08 DIAGNOSIS — Z7189 Other specified counseling: Secondary | ICD-10-CM | POA: Diagnosis not present

## 2018-12-08 DIAGNOSIS — R5383 Other fatigue: Secondary | ICD-10-CM | POA: Diagnosis not present

## 2018-12-08 DIAGNOSIS — Z6829 Body mass index (BMI) 29.0-29.9, adult: Secondary | ICD-10-CM | POA: Diagnosis not present

## 2018-12-08 DIAGNOSIS — Z125 Encounter for screening for malignant neoplasm of prostate: Secondary | ICD-10-CM | POA: Diagnosis not present

## 2018-12-08 DIAGNOSIS — E039 Hypothyroidism, unspecified: Secondary | ICD-10-CM | POA: Diagnosis not present

## 2018-12-08 DIAGNOSIS — Z1331 Encounter for screening for depression: Secondary | ICD-10-CM | POA: Diagnosis not present

## 2018-12-08 DIAGNOSIS — I10 Essential (primary) hypertension: Secondary | ICD-10-CM | POA: Diagnosis not present

## 2018-12-08 DIAGNOSIS — Z299 Encounter for prophylactic measures, unspecified: Secondary | ICD-10-CM | POA: Diagnosis not present

## 2018-12-09 ENCOUNTER — Ambulatory Visit (INDEPENDENT_AMBULATORY_CARE_PROVIDER_SITE_OTHER): Payer: PPO

## 2018-12-09 ENCOUNTER — Other Ambulatory Visit: Payer: Self-pay

## 2018-12-09 DIAGNOSIS — I10 Essential (primary) hypertension: Secondary | ICD-10-CM

## 2018-12-09 DIAGNOSIS — I480 Paroxysmal atrial fibrillation: Secondary | ICD-10-CM | POA: Diagnosis not present

## 2018-12-13 DIAGNOSIS — H903 Sensorineural hearing loss, bilateral: Secondary | ICD-10-CM | POA: Diagnosis not present

## 2018-12-13 NOTE — Progress Notes (Signed)
Called pt to inform him about his results. Pt understood   

## 2018-12-15 DIAGNOSIS — Z024 Encounter for examination for driving license: Secondary | ICD-10-CM | POA: Diagnosis not present

## 2018-12-17 ENCOUNTER — Ambulatory Visit: Payer: Self-pay | Admitting: Cardiology

## 2018-12-17 DIAGNOSIS — Z299 Encounter for prophylactic measures, unspecified: Secondary | ICD-10-CM | POA: Diagnosis not present

## 2018-12-17 DIAGNOSIS — Z713 Dietary counseling and surveillance: Secondary | ICD-10-CM | POA: Diagnosis not present

## 2018-12-17 DIAGNOSIS — I10 Essential (primary) hypertension: Secondary | ICD-10-CM | POA: Diagnosis not present

## 2018-12-17 DIAGNOSIS — Z6830 Body mass index (BMI) 30.0-30.9, adult: Secondary | ICD-10-CM | POA: Diagnosis not present

## 2018-12-17 DIAGNOSIS — M25561 Pain in right knee: Secondary | ICD-10-CM | POA: Diagnosis not present

## 2018-12-22 ENCOUNTER — Other Ambulatory Visit: Payer: PPO

## 2018-12-22 ENCOUNTER — Other Ambulatory Visit: Payer: Self-pay

## 2018-12-22 ENCOUNTER — Ambulatory Visit
Admission: RE | Admit: 2018-12-22 | Discharge: 2018-12-22 | Disposition: A | Discharge status: Home / Self Care | Payer: PPO | Source: Ambulatory Visit | Attending: Neurosurgery | Admitting: Neurosurgery

## 2018-12-22 DIAGNOSIS — M5416 Radiculopathy, lumbar region: Secondary | ICD-10-CM

## 2018-12-25 NOTE — Progress Notes (Signed)
Labs 12/08/2018: Total cholesterol 173, triglycerides 136, HDL 41, LDL 108.  BUN 11, creatinine 0.99, eGFR >70 mL, CMP otherwise normal, serum glucose 167.  HB 12.8/HCT 39.9, platelets 297.  TSH normal.  PSA mildly elevated at 4.3.

## 2018-12-25 NOTE — Progress Notes (Signed)
Duplicate labs

## 2018-12-27 ENCOUNTER — Other Ambulatory Visit: Payer: Self-pay

## 2018-12-27 ENCOUNTER — Ambulatory Visit (INDEPENDENT_AMBULATORY_CARE_PROVIDER_SITE_OTHER): Payer: PPO

## 2018-12-27 DIAGNOSIS — I480 Paroxysmal atrial fibrillation: Secondary | ICD-10-CM

## 2018-12-27 DIAGNOSIS — E119 Type 2 diabetes mellitus without complications: Secondary | ICD-10-CM

## 2019-01-03 DIAGNOSIS — I10 Essential (primary) hypertension: Secondary | ICD-10-CM | POA: Diagnosis not present

## 2019-01-06 DIAGNOSIS — S61011A Laceration without foreign body of right thumb without damage to nail, initial encounter: Secondary | ICD-10-CM | POA: Diagnosis not present

## 2019-01-06 DIAGNOSIS — Z23 Encounter for immunization: Secondary | ICD-10-CM | POA: Diagnosis not present

## 2019-01-10 ENCOUNTER — Encounter: Payer: Self-pay | Admitting: Cardiology

## 2019-01-10 ENCOUNTER — Other Ambulatory Visit: Payer: Self-pay

## 2019-01-10 ENCOUNTER — Ambulatory Visit (INDEPENDENT_AMBULATORY_CARE_PROVIDER_SITE_OTHER): Payer: PPO | Admitting: Cardiology

## 2019-01-10 VITALS — BP 136/80 | HR 76 | Ht 69.0 in | Wt 199.3 lb

## 2019-01-10 DIAGNOSIS — I480 Paroxysmal atrial fibrillation: Secondary | ICD-10-CM

## 2019-01-10 DIAGNOSIS — E782 Mixed hyperlipidemia: Secondary | ICD-10-CM | POA: Diagnosis not present

## 2019-01-10 DIAGNOSIS — I10 Essential (primary) hypertension: Secondary | ICD-10-CM

## 2019-01-10 DIAGNOSIS — I2510 Atherosclerotic heart disease of native coronary artery without angina pectoris: Secondary | ICD-10-CM

## 2019-01-10 MED ORDER — SPIRONOLACTONE 25 MG PO TABS: 25.0000 mg | ORAL_TABLET | Freq: Every day | ORAL | 3 refills | Status: DC

## 2019-01-10 MED ORDER — ATORVASTATIN CALCIUM 10 MG PO TABS: 10.0000 mg | ORAL_TABLET | Freq: Every day | ORAL | 2 refills | Status: DC

## 2019-01-10 NOTE — Progress Notes (Signed)
Primary Physician/Referring:  Glenda Chroman, MD  Patient ID: Brett Matthews, male    DOB: April 12, 1932, 86 y.o.   MRN: [id number]   Chief Complaint  Patient presents with  . Atrial Fibrillation  . Hypertension  . Follow-up   HPI:    Brett Matthews  is a 86 y.o.  Caucasian male referred to me for evaluation of new onset atrial fibrillation with rapid ventricular response and also  Resistant hypertension. His past medical history significant for hyperlipidemia, controlled diabetes mellitus, moderate coronary artery disease in mid circumflex with 60% stenosis by angiography in 2013.  Patient presented to [facility] at [place], [place] on 11/08/2018 with chest tightness, marked generalized weakness and also palpitations, he was found to be in atrial fibrillation with rapid ventricular response.  He was kept overnight and eventually discharged home with recommendation to follow-up with outpatient basis. I seen him 6 weeks ago, in view of difficulty in controlling hypertension, added spironolactone.  Obtained labs, renal function been stable.  States that since being on the medication he has not had any headaches, also states that his blood pressure has been well-controlled.  No dyspnea, no palpitations.  Tolerating anticoagulation.  Past Medical History:  Diagnosis Date  . Anemia, iron deficiency    Negative capsule endoscopy  . Arthritis   . Coronary atherosclerosis of native coronary artery    60 % circumflex stenosis; EF 65%, Cath 6/13  . Diverticula of colon    Pancolonic  . DM (diabetes mellitus), type 2, uncontrolled ([place])   . Dyspnea    Normal cardiopulmonary function test in July 2008, negative echocardiogram with "bubble" study for inter-cardiac shunt.  . Essential hypertension, benign   . Gastroesophageal reflux disease   . Hemorrhoids   . Hyperplastic colon polyp 04/06/2007  . Hypothyroidism 2003   Status post total thyroidectomy  . Migraines   . Neoplasm of lymphatic and  hematopoietic tissue   . Paroxysmal atrial fibrillation ([place])    Diagnosed 2005  . Skin cancer    Past Surgical History:  Procedure Laterality Date  . BACK SURGERY    . BONE MARROW BIOPSY  1990's  . CARDIAC CATHETERIZATION    . CATARACT EXTRACTION Bilateral   . COLONOSCOPY  04/06/2007   Dr. Gala Romney- Normal rectum with scattered pancolonic diverticula and slightly redundant elongated colon, diminutive polpy midsigmoid, remainder of colonic mucosa appeared normal. bx= hyperplastic polyp  . COLONOSCOPY N/A 11/29/2012   WGN:FAOZHYQM preparation. Friable anal canal/internal hemorrhoids; otherwise, normal rectum. Normal-appearing colonic mucosa  . COLONOSCOPY WITH PROPOFOL N/A 03/12/2017   Procedure: COLONOSCOPY WITH PROPOFOL;  Surgeon: Daneil Dolin, MD;  Location: [facility];  Service: Endoscopy;  Laterality: N/A;  7:30am  . ESOPHAGOGASTRODUODENOSCOPY  04/06/2007   Dr. Gala Romney- normal esophagus s/p nissen fundoplication, intact nissen wrap o/w normal stomach  . ESOPHAGOGASTRODUODENOSCOPY N/A 11/29/2012   RMR: Abnormall distal esophagus bx c/w GERD. prior fundoplication. Gastric polyps  -bx benign. Status post biopsy of normal--appearing duodenal mucosa (bx neg)  . HEMORRHOID SURGERY N/A 04/06/2017   Procedure: EXTENSIVE HEMORRHOIDECTOMY;  Surgeon: Aviva Signs, MD;  Location: [facility] ORS;  Service: General;  Laterality: N/A;  . LEFT HEART CATHETERIZATION WITH CORONARY ANGIOGRAM N/A 06/27/2011   Procedure: LEFT HEART CATHETERIZATION WITH CORONARY ANGIOGRAM;  Surgeon: Thayer Headings, MD;  Location: [facility] CATH LAB;  Service: Cardiovascular;  Laterality: N/A;  . NISSEN FUNDOPLICATION    . POLYPECTOMY  03/12/2017   Procedure: POLYPECTOMY;  Surgeon: Daneil Dolin, MD;  Location:  [facility];  Service: Endoscopy;;  cecal x3  . POSTERIOR LAMINECTOMY / DECOMPRESSION LUMBAR SPINE  ~ 1990's  . SKIN CANCER EXCISION     "off my back and chest; from sun"   Social History   Socioeconomic History  . Marital  status: Divorced    Spouse name: Not on file  . Number of children: 2  . Years of education: Not on file  . Highest education level: Not on file  Occupational History  . Occupation: Full Time  Tobacco Use  . Smoking status: Never Smoker  . Smokeless tobacco: Never Used  Substance and Sexual Activity  . Alcohol use: No  . Drug use: No  . Sexual activity: Never  Other Topics Concern  . Not on file  Social History Narrative  . Not on file   Social Determinants of Health   Financial Resource Strain:   . Difficulty of Paying Living Expenses: Not on file  Food Insecurity:   . Worried About Charity fundraiser in the Last Year: Not on file  . Ran Out of Food in the Last Year: Not on file  Transportation Needs:   . Lack of Transportation (Medical): Not on file  . Lack of Transportation (Non-Medical): Not on file  Physical Activity:   . Days of Exercise per Week: Not on file  . Minutes of Exercise per Session: Not on file  Stress:   . Feeling of Stress : Not on file  Social Connections:   . Frequency of Communication with Friends and Family: Not on file  . Frequency of Social Gatherings with Friends and Family: Not on file  . Attends Religious Services: Not on file  . Active Member of Clubs or Organizations: Not on file  . Attends Archivist Meetings: Not on file  . Marital Status: Not on file  Intimate Partner Violence:   . Fear of Current or Ex-Partner: Not on file  . Emotionally Abused: Not on file  . Physically Abused: Not on file  . Sexually Abused: Not on file   ROS  Review of Systems  Constitution: Negative for chills, decreased appetite, malaise/fatigue and weight gain.  Cardiovascular: Positive for palpitations. Negative for dyspnea on exertion, leg swelling and syncope.  Endocrine: Negative for cold intolerance.  Hematologic/Lymphatic: Does not bruise/bleed easily.  Musculoskeletal: Positive for back pain. Negative for joint swelling.    Gastrointestinal: Negative for abdominal pain, anorexia, change in bowel habit, hematochezia and melena.  Neurological: Negative for headaches and light-headedness.  Psychiatric/Behavioral: Negative for depression and substance abuse.  All other systems reviewed and are negative.  Objective   Vitals with BMI 01/10/2019 12/06/2018 11/15/2018  Height '5\' 9"'$  '5\' 9"'$  -  Weight 199 lbs 5 oz 208 lbs 11 oz -  BMI 39.76 73.41 -  Systolic 937 902 409  Diastolic 80 95 87  Pulse 76 75 -      Physical Exam  Constitutional:  He is well-built and mildly obese in no acute distress.  HENT:  Head: Atraumatic.  Eyes: Conjunctivae are normal.  Neck: No JVD present. No thyromegaly present.  Cardiovascular: Normal rate, regular rhythm, normal heart sounds and intact distal pulses. Exam reveals no gallop.  No murmur heard. No leg edema, no JVD.  Pulmonary/Chest: Effort normal and breath sounds normal.  Abdominal: Soft. Bowel sounds are normal.  obese  Musculoskeletal:        General: Normal range of motion.     Cervical back: Neck supple.  Neurological:  He is alert.  Skin: Skin is warm and dry.  Psychiatric: He has a normal mood and affect.   Laboratory examination:   No results for input(s): NA, K, CL, CO2, GLUCOSE, BUN, CREATININE, CALCIUM, GFRNONAA, GFRAA in the last 8760 hours. CrCl cannot be calculated (Patient's most recent lab result is older than the maximum 21 days allowed.).  CMP Latest Ref Rng & Units 04/08/2017 04/02/2017 02/26/2017  Glucose 65 - 99 mg/dL 91 98 130(H)  BUN 6 - 20 mg/dL '8 12 11  '$ Creatinine 0.61 - 1.24 mg/dL 0.80 0.89 0.84  Sodium 135 - 145 mmol/L 138 137 135  Potassium 3.5 - 5.1 mmol/L 3.8 3.9 3.8  Chloride 101 - 111 mmol/L 105 103 102  CO2 22 - 32 mmol/L '24 24 24  '$ Calcium 8.9 - 10.3 mg/dL 9.8 9.6 9.3  Total Protein 6.5 - 8.1 g/dL 7.3 - -  Total Bilirubin 0.3 - 1.2 mg/dL 0.5 - -  Alkaline Phos 38 - 126 U/L 86 - -  AST 15 - 41 U/L 17 - -  ALT 17 - 63 U/L 17 - -    CBC Latest Ref Rng & Units 04/08/2017 04/02/2017 03/02/2017  WBC 4.0 - 10.5 K/uL 7.0 6.2 7.1  Hemoglobin 13.0 - 17.0 g/dL 12.0(L) 12.0(L) 12.4(L)  Hematocrit 39.0 - 52.0 % 37.5(L) 37.7(L) 37.3(L)  Platelets 150 - 400 K/uL 284 331 303   Lipid Panel     Component Value Date/Time   CHOL 184 06/27/2011 0605   TRIG 186 (H) 06/27/2011 0605   HDL 44 06/27/2011 0605   CHOLHDL 4.2 06/27/2011 0605   VLDL 37 06/27/2011 0605   LDLCALC 103 (H) 06/27/2011 0605   HEMOGLOBIN A1C Lab Results  Component Value Date   HGBA1C 6.4 (H) 04/02/2017   MPG 136.98 04/02/2017   TSH No results for input(s): TSH in the last 8760 hours.   Labs 11/08/2018: Serum glucose 149 mg, BUN 16, creatinine 0.89, eGFR >60 mL.  CBC normal.  Labs 12/08/2018: Total cholesterol 173, triglycerides 136, HDL 41, LDL 118. Serum glucose 167 mg, BUN 11, creatinine 0.99, eGFR >70 mL, potassium 3.6.  CMP otherwise normal.  Alkaline phosphatase minimally elevated at 01/07/20.  HB 12.8/HCT 39.9, platelets 297.  TSH normal. PSA mildly elevated.   Medications and allergies  No Known Allergies   Current Outpatient Medications  Medication Instructions  . amoxicillin (AMOXIL) 500 MG capsule amoxicillin 500 mg capsule  . atorvastatin (LIPITOR) 10 mg, Oral, Daily  . benazepril (LOTENSIN) 40 mg, Oral, Daily  . carvedilol (COREG) 25 mg, Oral, 2 times daily with meals  . chlorthalidone (HYGROTON) 25 mg, Oral, Daily  . citalopram (CELEXA) 20 MG tablet 1 tablet, Oral, Daily  . clonazePAM (KLONOPIN) 0.5 MG tablet 1 tablet, Oral, Daily at bedtime  . cloNIDine (CATAPRES) 0.1 mg, Oral, When top blood pressure >170  . diltiazem (CARDIZEM CD) 240 mg, Oral, Daily  . docusate sodium (COLACE) 100 mg, Oral, Daily PRN  . famotidine (PEPCID) 40 mg, Oral, Daily  . fluticasone (FLONASE) 50 MCG/ACT nasal spray 1 spray, Each Nare, 2 times daily  . folic acid (FOLVITE) 1 MG tablet 1 tablet, Oral, Daily  . gabapentin (NEURONTIN) 300 MG capsule 2 capsules,  Oral, 2 times daily  . glimepiride (AMARYL) 2 mg, Oral, 2 times daily  . levothyroxine (SYNTHROID) 125 mcg, Oral, Daily before breakfast, SYNTHROID - brand name only   . LINZESS 290 MCG CAPS capsule 1 capsule, Oral, Daily  . metFORMIN (GLUCOPHAGE) 500 MG tablet  Take 2 tabs (1,'000mg'$ ) by mouth every morning & 1 tab ('500mg'$ ) every evening  . pantoprazole (PROTONIX) 40 mg, Daily  . rivaroxaban (XARELTO) 20 mg, Oral, Daily with supper  . spironolactone (ALDACTONE) 25 mg, Oral, Daily  . tamsulosin (FLOMAX) 0.4 mg, Oral, Daily after supper   Radiology:   Chest x-ray PA and lateral view 11/08/2018: Normal chest x-ray with no active lung disease.  Cardiac Studies:   Coronary  angiogram 06/27/2011: Mild left main disease, mid circumflex 60% stenosis.  Mild luminal irregularity of the RCA and normal LAD.  LVEF 65%.  Lexiscan Tetrofosmin Stress Test  12/27/2018: Nondiagnostic ECG stress. Normal myocardial perfusion. Stress LV EF: 58%.  No previous exam available for comparison. Low risk study.   Echocardiogram 12/09/2018: Left ventricle cavity is normal in size. Moderate concentric hypertrophy of the left ventricle. Normal LV systolic function with EF 65%. Normal global wall motion. Doppler evidence of grade I (impaired) diastolic dysfunction, normal LAP. Mild (Grade I) mitral regurgitation. Mild tricuspid regurgitation. Estimated pulmonary artery systolic pressure is 23 mmHg.  Assessment     ICD-10-CM   1. Paroxysmal atrial fibrillation (HCC)  I48.0 EKG 12-Lead   CHA2DS2-VASc Score is 5.  Yearly risk of stroke: 6.3% (Age, HTN, DM, CAD)  2. Coronary artery disease involving native coronary artery of native heart without angina pectoris  I25.10   3. Resistant hypertension  I10 spironolactone (ALDACTONE) 25 MG tablet  4. Essential hypertension, benign  I10   5. Mixed hyperlipidemia  E78.2 atorvastatin (LIPITOR) 10 MG tablet    EKG 01/10/2019: Sinus bradycardia at rate of 59 bpm, left atrial  enlargement, left axis deviation, left plantar fascicular block.  Poor R-wave progression, anteroseptal infarct old.  Nonspecific T abnormality. No significant change from  EKG 12/06/2018   EKG 11/05/2018: Atrial fibrillation with rapid ventricular response at the rate of 110 bpm, left axis deviation, left plantar fascicular block.  Poor R-wave progression, cannot exclude anterolateral infarct old.  Nonspecific T abnormality. EKG 11/05/2018: Few minutes before: SVT, probably atrial flutter with 2:1 conduction, ventricular rate 150 bpm.  Recommendations:   Meds ordered this encounter  Medications  . spironolactone (ALDACTONE) 25 MG tablet    Sig: Take 1 tablet (25 mg total) by mouth daily.    Dispense:  90 tablet    Refill:  3  . atorvastatin (LIPITOR) 10 MG tablet    Sig: Take 1 tablet (10 mg total) by mouth daily.    Dispense:  30 tablet    Refill:  2    Brett Matthews  is a 86 y.o. Caucasian male referred to me for evaluation of new onset atrial fibrillation with rapid ventricular response and also resistant hypertension. His past medical history significant for hyperlipidemia, controlled diabetes mellitus, moderate coronary artery disease in mid circumflex with 60% stenosis by angiography in 2013.  Patient presented to [facility] at [place], [place] on 11/08/2018 with chest tightness, marked generalized weakness and also palpitations, he was found to be in atrial fibrillation with rapid ventricular response.  He was kept overnight and eventually discharged home with recommendation to follow-up with outpatient basis. I seen in 4-6 weeks ago, he underwent stress testing and also echocardiogram and presents for follow-up.  Low risk stress test and essentially normal LVEF.  Spironolactone is working well with excellent control of blood pressure.  I refilled his prescription.  In view of diabetes mellitus and known coronary disease, hyperlipidemia, I started him on atorvastatin 10 mg daily.  He will  discuss with his PCP whether he should continue present dose or change the dosage.  Otherwise stable from cardiac standpoint, in view of high cardioembolic risk advised him to continue anticoagulation.  Advised him to discontinue fish in view of risk of increased bleeding.  Adrian Prows, MD, [facility] 01/10/2019, 12:32 PM [facility] [place] Pager: [phone] Office: [phone] If no answer Cell [phone]

## 2019-01-11 ENCOUNTER — Ambulatory Visit
Admission: RE | Admit: 2019-01-11 | Discharge: 2019-01-11 | Disposition: A | Discharge status: Home / Self Care | Payer: PPO | Source: Ambulatory Visit | Attending: Neurosurgery | Admitting: Neurosurgery

## 2019-01-11 DIAGNOSIS — M48061 Spinal stenosis, lumbar region without neurogenic claudication: Secondary | ICD-10-CM | POA: Diagnosis not present

## 2019-01-12 DIAGNOSIS — M47816 Spondylosis without myelopathy or radiculopathy, lumbar region: Secondary | ICD-10-CM | POA: Diagnosis not present

## 2019-01-12 DIAGNOSIS — M5136 Other intervertebral disc degeneration, lumbar region: Secondary | ICD-10-CM | POA: Diagnosis not present

## 2019-01-12 DIAGNOSIS — M545 Low back pain: Secondary | ICD-10-CM | POA: Diagnosis not present

## 2019-01-12 DIAGNOSIS — Z9889 Other specified postprocedural states: Secondary | ICD-10-CM | POA: Diagnosis not present

## 2019-01-16 ENCOUNTER — Other Ambulatory Visit: Payer: Self-pay

## 2019-01-16 ENCOUNTER — Emergency Department (HOSPITAL_COMMUNITY)
Admission: EM | Admit: 2019-01-16 | Discharge: 2019-01-16 | Disposition: A | Discharge status: Home / Self Care | Payer: PPO | Attending: Emergency Medicine | Admitting: Emergency Medicine

## 2019-01-16 ENCOUNTER — Encounter (HOSPITAL_COMMUNITY): Payer: Self-pay | Admitting: *Deleted

## 2019-01-16 ENCOUNTER — Emergency Department (HOSPITAL_COMMUNITY): Payer: PPO

## 2019-01-16 DIAGNOSIS — I10 Essential (primary) hypertension: Secondary | ICD-10-CM | POA: Diagnosis not present

## 2019-01-16 DIAGNOSIS — R0789 Other chest pain: Secondary | ICD-10-CM

## 2019-01-16 DIAGNOSIS — E119 Type 2 diabetes mellitus without complications: Secondary | ICD-10-CM | POA: Diagnosis not present

## 2019-01-16 DIAGNOSIS — R0981 Nasal congestion: Secondary | ICD-10-CM | POA: Diagnosis not present

## 2019-01-16 DIAGNOSIS — I4891 Unspecified atrial fibrillation: Secondary | ICD-10-CM | POA: Diagnosis not present

## 2019-01-16 DIAGNOSIS — Z4802 Encounter for removal of sutures: Secondary | ICD-10-CM | POA: Diagnosis not present

## 2019-01-16 DIAGNOSIS — Z7984 Long term (current) use of oral hypoglycemic drugs: Secondary | ICD-10-CM | POA: Insufficient documentation

## 2019-01-16 DIAGNOSIS — R079 Chest pain, unspecified: Secondary | ICD-10-CM | POA: Diagnosis not present

## 2019-01-16 DIAGNOSIS — Z7901 Long term (current) use of anticoagulants: Secondary | ICD-10-CM | POA: Diagnosis not present

## 2019-01-16 DIAGNOSIS — E039 Hypothyroidism, unspecified: Secondary | ICD-10-CM | POA: Insufficient documentation

## 2019-01-16 DIAGNOSIS — Z79899 Other long term (current) drug therapy: Secondary | ICD-10-CM | POA: Diagnosis not present

## 2019-01-16 DIAGNOSIS — I2510 Atherosclerotic heart disease of native coronary artery without angina pectoris: Secondary | ICD-10-CM | POA: Diagnosis not present

## 2019-01-16 DIAGNOSIS — R55 Syncope and collapse: Secondary | ICD-10-CM | POA: Diagnosis not present

## 2019-01-16 DIAGNOSIS — R001 Bradycardia, unspecified: Secondary | ICD-10-CM | POA: Diagnosis not present

## 2019-01-16 DIAGNOSIS — R61 Generalized hyperhidrosis: Secondary | ICD-10-CM | POA: Diagnosis not present

## 2019-01-16 DIAGNOSIS — I9589 Other hypotension: Secondary | ICD-10-CM | POA: Diagnosis not present

## 2019-01-16 DIAGNOSIS — R531 Weakness: Secondary | ICD-10-CM | POA: Diagnosis not present

## 2019-01-16 LAB — CBC WITH DIFFERENTIAL/PLATELET
Abs Immature Granulocytes: 0.04 10*3/uL (ref 0.00–0.07)
Basophils Absolute: 0.1 10*3/uL (ref 0.0–0.1)
Basophils Relative: 1 %
Eosinophils Absolute: 0.1 10*3/uL (ref 0.0–0.5)
Eosinophils Relative: 2 %
HCT: 42.5 % (ref 39.0–52.0)
Hemoglobin: 13.6 g/dL (ref 13.0–17.0)
Immature Granulocytes: 1 %
Lymphocytes Relative: 14 %
Lymphs Abs: 1.3 10*3/uL (ref 0.7–4.0)
MCH: 27.5 pg (ref 26.0–34.0)
MCHC: 32 g/dL (ref 30.0–36.0)
MCV: 86 fL (ref 80.0–100.0)
Monocytes Absolute: 0.6 10*3/uL (ref 0.1–1.0)
Monocytes Relative: 7 %
Neutro Abs: 6.6 10*3/uL (ref 1.7–7.7)
Neutrophils Relative %: 75 %
Platelets: 273 10*3/uL (ref 150–400)
RBC: 4.94 MIL/uL (ref 4.22–5.81)
RDW: 13.6 % (ref 11.5–15.5)
WBC: 8.7 10*3/uL (ref 4.0–10.5)
nRBC: 0 % (ref 0.0–0.2)

## 2019-01-16 LAB — COMPREHENSIVE METABOLIC PANEL
ALT: 18 U/L (ref 0–44)
AST: 16 U/L (ref 15–41)
Albumin: 3.5 g/dL (ref 3.5–5.0)
Alkaline Phosphatase: 83 U/L (ref 38–126)
Anion gap: 9 (ref 5–15)
BUN: 27 mg/dL — ABNORMAL HIGH (ref 8–23)
CO2: 25 mmol/L (ref 22–32)
Calcium: 9.4 mg/dL (ref 8.9–10.3)
Chloride: 98 mmol/L (ref 98–111)
Creatinine, Ser: 1.14 mg/dL (ref 0.61–1.24)
GFR calc Af Amer: >60 mL/min (ref >60)
GFR calc non Af Amer: 59 mL/min — ABNORMAL LOW (ref >60)
Glucose, Bld: 212 mg/dL — ABNORMAL HIGH (ref 70–99)
Potassium: 4.2 mmol/L (ref 3.5–5.1)
Sodium: 132 mmol/L — ABNORMAL LOW (ref 135–145)
Total Bilirubin: 0.7 mg/dL (ref 0.3–1.2)
Total Protein: 6.3 g/dL — ABNORMAL LOW (ref 6.5–8.1)

## 2019-01-16 LAB — TROPONIN I (HIGH SENSITIVITY)
Troponin I (High Sensitivity): 115 ng/L (ref <18)
Troponin I (High Sensitivity): 100 ng/L (ref <18)

## 2019-01-16 NOTE — ED Notes (Signed)
Pt states covid and flu checked at urgent Care today.

## 2019-01-16 NOTE — ED Triage Notes (Signed)
Pt with CP and mild sob for past 3-4 days. Denies CP or sob at this time.  EMS picked up from Urgent Care. SBP with EMS in 90's and IV started and 250 cc bolus given en route

## 2019-01-16 NOTE — ED Notes (Addendum)
CRITICAL VALUE ALERT  Critical Value:  Trop 115  Date & Time Notied:  01/16/19 1438  Provider Notified: Threasa Alpha, [place]  Orders Received/Actions taken: na

## 2019-01-16 NOTE — ED Notes (Addendum)
Date and time results received: 01/16/19 3:59 PM   (use smartphrase ".now" to insert current time)  Test: troponin Critical Value: 100  Name of Provider Notified: karen PA   Orders Received? Or Actions Taken?: Orders Received - See Orders for details

## 2019-01-16 NOTE — ED Provider Notes (Signed)
[facility] Provider Note   CSN: [id number] Arrival date & time: 01/16/19  1213     History Chief Complaint  Patient presents with  . Chest Pain    Brett Matthews is a 86 y.o. male.  Pt reports he went to Urgent care to have sutures removed.  Pt reports he told them he has had some chest tightness for the past 4 days.  Pt was noted to be in afib on EKG.  Pt reports he feels fine now.  He states he felt dizzy while sutures were being removed.  Pt is followed by cardiology.  He is taking xarelto.  Pt denies fever or chills, no cough or congestion.  Pt reports he is taking his medications.  Pt reports he saw his cardilologist last week.    The history is provided by the patient. No language interpreter was used.       Past Medical History:  Diagnosis Date  . Anemia, iron deficiency    Negative capsule endoscopy  . Arthritis   . Coronary atherosclerosis of native coronary artery    60 % circumflex stenosis; EF 65%, Cath 6/13  . Diverticula of colon    Pancolonic  . DM (diabetes mellitus), type 2, uncontrolled ([place])   . Dyspnea    Normal cardiopulmonary function test in July 2008, negative echocardiogram with "bubble" study for inter-cardiac shunt.  . Essential hypertension, benign   . Gastroesophageal reflux disease   . Hemorrhoids   . Hyperplastic colon polyp 04/06/2007  . Hypothyroidism 2003   Status post total thyroidectomy  . Migraines   . Neoplasm of lymphatic and hematopoietic tissue   . Paroxysmal atrial fibrillation ([place])    Diagnosed 2005  . Skin cancer     Patient Active Problem List   Diagnosis Date Noted  . Chest pain of uncertain etiology 62/22/9798  . H/O hemorrhoidectomy   . Bleeding hemorrhoids 03/24/2017  . Abdominal pain 03/02/2017  . Anemia 03/02/2017  . Rectal bleeding 03/17/2013  . Dyslipidemia 07/24/2010  . CAD, NATIVE VESSEL 09/26/2008  . HYPOTHYROIDISM 08/06/2007  . Type 2 diabetes mellitus without complication,  without long-term current use of insulin ([place]) 08/06/2007  . Essential hypertension, benign 08/06/2007  . GASTROESOPHAGEAL REFLUX DISEASE, CHRONIC 08/06/2007  . Paroxysmal atrial fibrillation ([place]) 08/06/2007    Past Surgical History:  Procedure Laterality Date  . BACK SURGERY    . BONE MARROW BIOPSY  1990's  . CARDIAC CATHETERIZATION    . CATARACT EXTRACTION Bilateral   . COLONOSCOPY  04/06/2007   Dr. Gala Romney- Normal rectum with scattered pancolonic diverticula and slightly redundant elongated colon, diminutive polpy midsigmoid, remainder of colonic mucosa appeared normal. bx= hyperplastic polyp  . COLONOSCOPY N/A 11/29/2012   [id number] preparation. Friable anal canal/internal hemorrhoids; otherwise, normal rectum. Normal-appearing colonic mucosa  . COLONOSCOPY WITH PROPOFOL N/A 03/12/2017   Procedure: COLONOSCOPY WITH PROPOFOL;  Surgeon: Daneil Dolin, MD;  Location: [facility];  Service: Endoscopy;  Laterality: N/A;  7:30am  . ESOPHAGOGASTRODUODENOSCOPY  04/06/2007   Dr. Gala Romney- normal esophagus s/p nissen fundoplication, intact nissen wrap o/w normal stomach  . ESOPHAGOGASTRODUODENOSCOPY N/A 11/29/2012   RMR: Abnormall distal esophagus bx c/w GERD. prior fundoplication. Gastric polyps  -bx benign. Status post biopsy of normal--appearing duodenal mucosa (bx neg)  . HEMORRHOID SURGERY N/A 04/06/2017   Procedure: EXTENSIVE HEMORRHOIDECTOMY;  Surgeon: Aviva Signs, MD;  Location: [facility] ORS;  Service: General;  Laterality: N/A;  . LEFT HEART CATHETERIZATION WITH CORONARY ANGIOGRAM N/A 06/27/2011  Procedure: LEFT HEART CATHETERIZATION WITH CORONARY ANGIOGRAM;  Surgeon: Thayer Headings, MD;  Location: [facility] CATH LAB;  Service: Cardiovascular;  Laterality: N/A;  . NISSEN FUNDOPLICATION    . POLYPECTOMY  03/12/2017   Procedure: POLYPECTOMY;  Surgeon: Daneil Dolin, MD;  Location: [facility];  Service: Endoscopy;;  cecal x3  . POSTERIOR LAMINECTOMY / DECOMPRESSION LUMBAR SPINE  ~ 1990's  . SKIN  CANCER EXCISION     "off my back and chest; from sun"       Family History  Problem Relation Age of Onset  . Cancer Other   . Stroke Other   . Hypertension Mother   . Stroke Mother   . Cancer Father   . Cancer Sister   . Stroke Brother   . Colon cancer Neg Hx     Social History   Tobacco Use  . Smoking status: Never Smoker  . Smokeless tobacco: Never Used  Substance Use Topics  . Alcohol use: No  . Drug use: No    Home Medications Prior to Admission medications   Medication Sig Start Date End Date Taking? Authorizing Provider  amoxicillin (AMOXIL) 500 MG capsule amoxicillin 500 mg capsule    [provider]  atorvastatin (LIPITOR) 10 MG tablet Take 1 tablet (10 mg total) by mouth daily. 01/10/19 04/10/19  Adrian Prows, MD  benazepril (LOTENSIN) 40 MG tablet Take 40 mg by mouth daily.    [provider]  carvedilol (COREG) 25 MG tablet Take 25 mg by mouth 2 (two) times daily with a meal.     [provider]  chlorthalidone (HYGROTON) 25 MG tablet Take 25 mg by mouth daily. 12/03/18   [provider]  citalopram (CELEXA) 20 MG tablet Take 1 tablet by mouth daily. 03/23/17   [provider]  clonazePAM (KLONOPIN) 0.5 MG tablet Take 1 tablet by mouth at bedtime.  03/28/17   [provider]  cloNIDine (CATAPRES) 0.1 MG tablet Take 0.1 mg by mouth. When top blood pressure >170 11/25/18   [provider]  diltiazem (CARDIZEM CD) 240 MG 24 hr capsule Take 240 mg by mouth daily.    [provider]  docusate sodium (COLACE) 100 MG capsule Take 100 mg by mouth daily as needed for mild constipation.    [provider]  famotidine (PEPCID) 40 MG tablet Take 40 mg by mouth daily.    [provider]  fluticasone (FLONASE) 50 MCG/ACT nasal spray Place 1 spray into both nostrils 2 (two) times daily.    [provider]  folic acid (FOLVITE) 1 MG tablet Take 1 tablet by mouth daily. 03/12/17   [provider]  gabapentin (NEURONTIN) 300 MG capsule Take 2 capsules by mouth 2 (two) times daily. 10/29/18   [provider]  glimepiride (AMARYL) 2 MG tablet Take 2 mg by mouth 2 (two) times daily. 03/10/17   [provider]  levothyroxine (SYNTHROID) 125 MCG tablet Take 125 mcg by mouth daily before breakfast. SYNTHROID - brand name only    [provider]  LINZESS 290 MCG CAPS capsule Take 1 capsule by mouth daily. 08/19/18   [provider]  metFORMIN (GLUCOPHAGE) 500 MG tablet Take 2 tabs (1,'000mg'$ ) by mouth every morning & 1 tab ('500mg'$ ) every evening     [provider]  pantoprazole (PROTONIX) 40 MG tablet Take 40 mg by mouth daily.      [provider]  rivaroxaban (XARELTO) 20 MG TABS tablet Take 20  mg by mouth daily with supper.    [provider]  spironolactone (ALDACTONE) 25 MG tablet Take 1 tablet (25 mg total) by mouth daily. 01/10/19 01/05/20  Adrian Prows, MD  tamsulosin (FLOMAX) 0.4 MG CAPS capsule Take 0.4 mg by mouth daily after supper.  09/09/12   [provider]    Allergies    Patient has no known allergies.  Review of Systems   Review of Systems  Respiratory: Positive for chest tightness.   Cardiovascular: Negative for chest pain.  All other systems reviewed and are negative.   Physical Exam Updated Vital Signs BP 106/80   Pulse (!) 109   Temp 97.6 F (36.4 C) (Oral)   Resp 16   Ht '5\' 9"'$  (1.753 m)   Wt 86.2 kg   SpO2 97%   BMI 28.06 kg/m   Physical Exam Vitals and nursing note reviewed.  Constitutional:      Appearance: He is well-developed.  HENT:     Head: Normocephalic and atraumatic.  Eyes:     Conjunctiva/sclera: Conjunctivae normal.  Cardiovascular:     Rate and Rhythm: Normal rate and regular rhythm.     Heart sounds: Normal heart sounds. No murmur.  Pulmonary:     Effort: Pulmonary effort is normal. No respiratory distress.     Breath sounds: Normal breath sounds.    Abdominal:     Palpations: Abdomen is soft.     Tenderness: There is no abdominal tenderness.  Musculoskeletal:        General: Normal range of motion.     Cervical back: Neck supple.  Skin:    General: Skin is warm and dry.  Neurological:     General: No focal deficit present.     Mental Status: He is alert.  Psychiatric:        Mood and Affect: Mood normal.     ED Results / Procedures / Treatments   Labs (all labs ordered are listed, but only abnormal results are displayed) Labs Reviewed  COMPREHENSIVE METABOLIC PANEL - Abnormal; Notable for the following components:      Result Value   Sodium 132 (*)    Glucose, Bld 212 (*)    BUN 27 (*)    Total Protein 6.3 (*)    GFR calc non Af Amer 59 (*)    All other components within normal limits  TROPONIN I (HIGH SENSITIVITY) - Abnormal; Notable for the following components:   Troponin I (High Sensitivity) 115 (*)    All other components within normal limits  TROPONIN I (HIGH SENSITIVITY) - Abnormal; Notable for the following components:   Troponin I (High Sensitivity) 100 (*)    All other components within normal limits  CBC WITH DIFFERENTIAL/PLATELET    EKG EKG Interpretation  Date/Time:  Sunday January 16 2019 12:21:34 EST Ventricular Rate:  100 PR Interval:    QRS Duration: 109 QT Interval:  363 QTC Calculation: 449 R Axis:   -60 Text Interpretation: Atrial flutter Left anterior fascicular block Probable anterolateral infarct, old ST elevation, consider inferior injury Confirmed by Fredia Sorrow [phone]) on 01/16/2019 1:01:07 PM   Radiology DG Chest Port 1 View  Result Date: 01/16/2019 CLINICAL DATA:  Chest pain EXAM: PORTABLE CHEST 1 VIEW COMPARISON:  Chest radiograph dated 11/05/2018 FINDINGS: The heart size is normal. Vascular calcifications are seen in the aortic arch. Both lungs are clear. The visualized skeletal structures are unremarkable. IMPRESSION: 1. No active cardiopulmonary disease. Aortic  Atherosclerosis (ICD10-I70.0). Electronically Signed  By: Zerita Boers M.D.   On: 01/16/2019 13:59    Procedures Procedures (including critical care time)  Medications Ordered in ED Medications - No data to display  ED Course  I have reviewed the triage vital signs and the nursing notes.  Pertinent labs & imaging results that were available during my care of the patient were reviewed by me and considered in my medical decision making (see chart for details).  Clinical Course as of Jan 15 1629  Nancy Fetter Jan 16, 2019  1604 Troponin I (High Sensitivity)(!!): 100 [LS]  1606 Troponin I (High Sensitivity)(!!) [LS]    Clinical Course User Index [LS] Fransico Meadow, PA-C   MDM Rules/Calculators/A&P                      MDM  Pt has elevation of 1st troponin to 115 second decreased to 100.  Pt is in afib rate vries from 90-100. Pt denies any  pain.  He states he wants to go home.   I spoke to Dr. Einar Gip who advised he will call pt to schedule outpatient follow up.  Final Clinical Impression(s) / ED Diagnoses Final diagnoses:  Atypical chest pain  Atrial fibrillation, unspecified type ([place])    Rx / DC Orders ED Discharge Orders    None    An After Visit Summary was printed and given to the patient.    Fransico Meadow, PA-C 01/16/19 1714    Fredia Sorrow, MD 02/05/19 [phone]

## 2019-01-17 ENCOUNTER — Encounter: Payer: Self-pay | Admitting: Cardiology

## 2019-01-17 ENCOUNTER — Other Ambulatory Visit (HOSPITAL_COMMUNITY)
Admission: RE | Admit: 2019-01-17 | Discharge: 2019-01-17 | Disposition: A | Discharge status: Home / Self Care | Payer: PPO | Source: Ambulatory Visit | Attending: Cardiology | Admitting: Cardiology

## 2019-01-17 ENCOUNTER — Other Ambulatory Visit: Payer: Self-pay | Admitting: Cardiology

## 2019-01-17 ENCOUNTER — Ambulatory Visit (INDEPENDENT_AMBULATORY_CARE_PROVIDER_SITE_OTHER): Payer: PPO | Admitting: Cardiology

## 2019-01-17 VITALS — BP 115/69 | HR 49 | Temp 97.1°F | Ht 69.0 in | Wt 201.0 lb

## 2019-01-17 DIAGNOSIS — E782 Mixed hyperlipidemia: Secondary | ICD-10-CM | POA: Diagnosis not present

## 2019-01-17 DIAGNOSIS — I10 Essential (primary) hypertension: Secondary | ICD-10-CM

## 2019-01-17 DIAGNOSIS — Z20822 Contact with and (suspected) exposure to covid-19: Secondary | ICD-10-CM | POA: Insufficient documentation

## 2019-01-17 DIAGNOSIS — I480 Paroxysmal atrial fibrillation: Secondary | ICD-10-CM | POA: Diagnosis not present

## 2019-01-17 DIAGNOSIS — I2510 Atherosclerotic heart disease of native coronary artery without angina pectoris: Secondary | ICD-10-CM

## 2019-01-17 DIAGNOSIS — Z01812 Encounter for preprocedural laboratory examination: Secondary | ICD-10-CM | POA: Diagnosis not present

## 2019-01-17 LAB — SARS CORONAVIRUS 2 (TAT 6-24 HRS): SARS Coronavirus 2: NEGATIVE

## 2019-01-17 MED ORDER — FLECAINIDE ACETATE 50 MG PO TABS: 50.0000 mg | ORAL_TABLET | Freq: Two times a day (BID) | ORAL | 1 refills | Status: DC

## 2019-01-17 NOTE — Progress Notes (Signed)
Primary Physician/Referring:  Glenda Chroman, MD  Patient ID: Brett Matthews, male    DOB: [birth year], 86 y.o.   MRN: [id number]   Chief Complaint  Patient presents with  . Atrial Fibrillation  . Hypertension  . Hospitalization Follow-up   HPI:    Brett Matthews  is a 86 y.o.  Caucasian male referred to me for evaluation of new onset atrial fibrillation with rapid ventricular response and also  Resistant hypertension. His past medical history significant for hyperlipidemia, controlled diabetes mellitus, moderate coronary artery disease in mid circumflex with 60% stenosis by angiography in 2013.  Patient presented to [facility] at [place], [place] on 11/08/2018 with chest tightness, marked generalized weakness and also palpitations, he was found to be in atrial fibrillation with rapid ventricular response.    He presented again to the emergency department on 01/16/2019 with atypical chest pain and dyspnea  and was found to be back in atypical atrial flutter.  He now presents to the office for follow-up.  His high sensitive serum troponin was mildly elevated at 100 with no delta change. Denies palpitations, but states he is fatigued and has dyspnea over the past one week.   Past Medical History:  Diagnosis Date  . Anemia, iron deficiency    Negative capsule endoscopy  . Arthritis   . Coronary atherosclerosis of native coronary artery    60 % circumflex stenosis; EF 65%, Cath 6/13  . Diverticula of colon    Pancolonic  . DM (diabetes mellitus), type 2, uncontrolled ([place])   . Dyspnea    Normal cardiopulmonary function test in July 2008, negative echocardiogram with "bubble" study for inter-cardiac shunt.  . Essential hypertension, benign   . Gastroesophageal reflux disease   . Hemorrhoids   . Hyperplastic colon polyp 04/06/2007  . Hypothyroidism 2003   Status post total thyroidectomy  . Migraines   . Neoplasm of lymphatic and hematopoietic tissue   . Paroxysmal atrial fibrillation  ([place])    Diagnosed 2005  . Skin cancer    Past Surgical History:  Procedure Laterality Date  . BACK SURGERY    . BONE MARROW BIOPSY  1990's  . CARDIAC CATHETERIZATION    . CATARACT EXTRACTION Bilateral   . COLONOSCOPY  04/06/2007   Dr. Gala Romney- Normal rectum with scattered pancolonic diverticula and slightly redundant elongated colon, diminutive polpy midsigmoid, remainder of colonic mucosa appeared normal. bx= hyperplastic polyp  . COLONOSCOPY N/A 11/29/2012   DJM:EQASTMHD preparation. Friable anal canal/internal hemorrhoids; otherwise, normal rectum. Normal-appearing colonic mucosa  . COLONOSCOPY WITH PROPOFOL N/A 03/12/2017   Procedure: COLONOSCOPY WITH PROPOFOL;  Surgeon: Daneil Dolin, MD;  Location: [facility] END[facility];  Service: Endoscopy;  Laterality: N/A;  7:30am  . ESOPHAGOGASTRODUODENOSCOPY  04/06/2007   Dr. Gala Romney- normal esophagus s/p nissen fundoplication, intact nissen wrap o/w normal stomach  . ESOPHAGOGASTRODUODENOSCOPY N/A 11/29/2012   RMR: Abnormall distal esophagus bx c/w GERD. prior fundoplication. Gastric polyps  -bx benign. Status post biopsy of normal--appearing duodenal mucosa (bx neg)  . HEMORRHOID SURGERY N/A 04/06/2017   Procedure: EXTENSIVE HEMORRHOIDECTOMY;  Surgeon: Aviva Signs, MD;  Location: [facility] ORS;  Service: General;  Laterality: N/A;  . LEFT HEART CATHETERIZATION WITH CORONARY ANGIOGRAM N/A 06/27/2011   Procedure: LEFT HEART CATHETERIZATION WITH CORONARY ANGIOGRAM;  Surgeon: Thayer Headings, MD;  Location: [facility] CATH LAB;  Service: Cardiovascular;  Laterality: N/A;  . NISSEN FUNDOPLICATION    . POLYPECTOMY  03/12/2017   Procedure: POLYPECTOMY;  Surgeon: Daneil Dolin, MD;  Location: [facility];  Service: Endoscopy;;  cecal x3  . POSTERIOR LAMINECTOMY / DECOMPRESSION LUMBAR SPINE  ~ 1990's  . SKIN CANCER EXCISION     "off my back and chest; from sun"   Social History   Socioeconomic History  . Marital status: Divorced    Spouse name: Not on file  . Number of  children: 2  . Years of education: Not on file  . Highest education level: Not on file  Occupational History  . Occupation: Full Time  Tobacco Use  . Smoking status: Never Smoker  . Smokeless tobacco: Never Used  Substance and Sexual Activity  . Alcohol use: No  . Drug use: No  . Sexual activity: Never  Other Topics Concern  . Not on file  Social History Narrative  . Not on file   Social Determinants of Health   Financial Resource Strain:   . Difficulty of Paying Living Expenses: Not on file  Food Insecurity:   . Worried About Charity fundraiser in the Last Year: Not on file  . Ran Out of Food in the Last Year: Not on file  Transportation Needs:   . Lack of Transportation (Medical): Not on file  . Lack of Transportation (Non-Medical): Not on file  Physical Activity:   . Days of Exercise per Week: Not on file  . Minutes of Exercise per Session: Not on file  Stress:   . Feeling of Stress : Not on file  Social Connections:   . Frequency of Communication with Friends and Family: Not on file  . Frequency of Social Gatherings with Friends and Family: Not on file  . Attends Religious Services: Not on file  . Active Member of Clubs or Organizations: Not on file  . Attends Archivist Meetings: Not on file  . Marital Status: Not on file  Intimate Partner Violence:   . Fear of Current or Ex-Partner: Not on file  . Emotionally Abused: Not on file  . Physically Abused: Not on file  . Sexually Abused: Not on file   ROS  Review of Systems  Constitution: Positive for malaise/fatigue. Negative for weight gain.  Cardiovascular: Positive for dyspnea on exertion. Negative for leg swelling, palpitations and syncope.  Endocrine: Negative for cold intolerance.  Hematologic/Lymphatic: Does not bruise/bleed easily.  Musculoskeletal: Positive for back pain. Negative for joint swelling.  Gastrointestinal: Negative for abdominal pain, anorexia, change in bowel habit, hematochezia  and melena.  Neurological: Negative for headaches and light-headedness.  Psychiatric/Behavioral: Negative for depression and substance abuse.  All other systems reviewed and are negative.  Objective   Vitals with BMI 01/17/2019 01/16/2019 01/16/2019  Height '5\' 9"'$  - -  Weight 201 lbs - -  BMI 46.27 - -  Systolic 035 009 -  Diastolic 69 80 -  Pulse 49 - 103      Physical Exam  Constitutional:  He is well-built and mildly obese in no acute distress.  HENT:  Head: Atraumatic.  Eyes: Conjunctivae are normal.  Neck: No JVD present. No thyromegaly present.  Cardiovascular: Normal rate, intact distal pulses and normal pulses. An irregularly irregular rhythm present. Exam reveals no gallop.  No murmur heard. S1 variable, S2 normal. No leg edema, no JVD.  Pulmonary/Chest: Effort normal and breath sounds normal.  Abdominal: Soft. Bowel sounds are normal.  obese  Musculoskeletal:        General: Normal range of motion.     Cervical back: Neck supple.  Neurological: He is  alert.  Skin: Skin is warm and dry.  Psychiatric: He has a normal mood and affect.   Laboratory examination:   Recent Labs    01/16/19 1345  NA 132*  K 4.2  CL 98  CO2 25  GLUCOSE 212*  BUN 27*  CREATININE 1.14  CALCIUM 9.4  GFRNONAA 59*  GFRAA >60   estimated creatinine clearance is 53.8 mL/min (by C-G formula based on SCr of 1.14 mg/dL).  CMP Latest Ref Rng & Units 01/16/2019 04/08/2017 04/02/2017  Glucose 70 - 99 mg/dL 212(H) 91 98  BUN 8 - 23 mg/dL 27(H) 8 12  Creatinine 0.61 - 1.24 mg/dL 1.14 0.80 0.89  Sodium 135 - 145 mmol/L 132(L) 138 137  Potassium 3.5 - 5.1 mmol/L 4.2 3.8 3.9  Chloride 98 - 111 mmol/L 98 105 103  CO2 22 - 32 mmol/L '25 24 24  '$ Calcium 8.9 - 10.3 mg/dL 9.4 9.8 9.6  Total Protein 6.5 - 8.1 g/dL 6.3(L) 7.3 -  Total Bilirubin 0.3 - 1.2 mg/dL 0.7 0.5 -  Alkaline Phos 38 - 126 U/L 83 86 -  AST 15 - 41 U/L 16 17 -  ALT 0 - 44 U/L 18 17 -   CBC Latest Ref Rng & Units 01/16/2019  04/08/2017 04/02/2017  WBC 4.0 - 10.5 K/uL 8.7 7.0 6.2  Hemoglobin 13.0 - 17.0 g/dL 13.6 12.0(L) 12.0(L)  Hematocrit 39.0 - 52.0 % 42.5 37.5(L) 37.7(L)  Platelets 150 - 400 K/uL 273 284 331   Lipid Panel     Component Value Date/Time   CHOL 184 06/27/2011 0605   TRIG 186 (H) 06/27/2011 0605   HDL 44 06/27/2011 0605   CHOLHDL 4.2 06/27/2011 0605   VLDL 37 06/27/2011 0605   LDLCALC 103 (H) 06/27/2011 0605   HEMOGLOBIN A1C Lab Results  Component Value Date   HGBA1C 6.4 (H) 04/02/2017   MPG 136.98 04/02/2017   TSH No results for input(s): TSH in the last 8760 hours.   Labs 11/08/2018: Serum glucose 149 mg, BUN 16, creatinine 0.89, eGFR >60 mL.  CBC normal.  Labs 12/08/2018: Total cholesterol 173, triglycerides 136, HDL 41, LDL 118. Serum glucose 167 mg, BUN 11, creatinine 0.99, eGFR >70 mL, potassium 3.6.  CMP otherwise normal.  Alkaline phosphatase minimally elevated at 01/07/20.  HB 12.8/HCT 39.9, platelets 297.  TSH normal. PSA mildly elevated.   Medications and allergies  No Known Allergies   Current Outpatient Medications  Medication Instructions  . atorvastatin (LIPITOR) 10 mg, Oral, Daily  . benazepril (LOTENSIN) 40 mg, Oral, Daily  . carvedilol (COREG) 25 mg, Oral, 2 times daily with meals  . chlorthalidone (HYGROTON) 25 mg, Oral, Daily  . citalopram (CELEXA) 20 MG tablet 1 tablet, Oral, Daily  . clonazePAM (KLONOPIN) 0.5 MG tablet 1 tablet, Oral, Daily at bedtime  . cloNIDine (CATAPRES) 0.1 mg, Oral, When top blood pressure >170  . diltiazem (CARDIZEM CD) 240 mg, Oral, Daily  . docusate sodium (COLACE) 100 mg, Oral, Daily PRN  . famotidine (PEPCID) 40 mg, Oral, Daily  . flecainide (TAMBOCOR) 50 mg, Oral, 2 times daily  . fluticasone (FLONASE) 50 MCG/ACT nasal spray 1 spray, Each Nare, 2 times daily  . folic acid (FOLVITE) 1 MG tablet 1 tablet, Oral, Daily  . gabapentin (NEURONTIN) 300 MG capsule 2 capsules, Oral, 2 times daily  . glimepiride (AMARYL) 2 mg, Oral, 2  times daily  . levothyroxine (SYNTHROID) 125 mcg, Oral, Daily before breakfast, SYNTHROID - brand name only   . Rolan Lipa  290 MCG CAPS capsule 1 capsule, Oral, Daily  . metFORMIN (GLUCOPHAGE) 500 MG tablet Take 2 tabs (1,'000mg'$ ) by mouth every morning & 1 tab ('500mg'$ ) every evening  . pantoprazole (PROTONIX) 40 mg, Daily  . rivaroxaban (XARELTO) 20 mg, Oral, Daily with supper  . spironolactone (ALDACTONE) 25 mg, Oral, Daily  . tamsulosin (FLOMAX) 0.4 mg, Oral, Daily after supper   Radiology:   Chest x-ray PA and lateral view 11/08/2018: Normal chest x-ray with no active lung disease.  Cardiac Studies:   Coronary  angiogram 06/27/2011: Mild left main disease, mid circumflex 60% stenosis.  Mild luminal irregularity of the RCA and normal LAD.  LVEF 65%.  Lexiscan Tetrofosmin Stress Test  12/27/2018: Nondiagnostic ECG stress. Normal myocardial perfusion. Stress LV EF: 58%.  No previous exam available for comparison. Low risk study.   Echocardiogram 12/09/2018: Left ventricle cavity is normal in size. Moderate concentric hypertrophy of the left ventricle. Normal LV systolic function with EF 65%. Normal global wall motion. Doppler evidence of grade I (impaired) diastolic dysfunction, normal LAP. Mild (Grade I) mitral regurgitation. Mild tricuspid regurgitation. Estimated pulmonary artery systolic pressure is 23 mmHg.  Assessment     ICD-10-CM   1. Paroxysmal atrial fibrillation (HCC) CHA2DS2-VASc Score is 4.  Yearly risk of stroke: 4%(A, HTN, DM)   I48.0 EKG 12-Lead  2. Coronary artery disease involving native coronary artery of native heart without angina pectoris  I25.10   3. Essential hypertension, benign  I10   4. Mixed hyperlipidemia  E78.2    EKG 01/17/2019: Atypical atrial flutter with variable ventricular response at the rate of 110 bpm, left axis deviation, left anterior fascicular block.  IVCD.  Poor R-wave progression, cannot exclude anterolateral infarct old.  No evidence of  ischemia.  Low-voltage limb leads, consider COPD.   EKG 01/10/2019: Sinus bradycardia at rate of 59 bpm, left atrial enlargement, left axis deviation, left anterior fascicular block.  Poor R-wave progression, anteroseptal infarct old.  Nonspecific T abnormality. No significant change from  EKG 12/06/2018   EKG 11/05/2018: Atrial fibrillation with rapid ventricular response at the rate of 110 bpm, left axis deviation, left anterior fascicular block.  Poor R-wave progression, cannot exclude anterolateral infarct old.  Nonspecific T abnormality. EKG 11/05/2018: Few minutes before: SVT, probably atrial flutter with 2:1 conduction, ventricular rate 150 bpm.  Recommendations:   Meds ordered this encounter  Medications  . flecainide (TAMBOCOR) 50 MG tablet    Sig: Take 1 tablet (50 mg total) by mouth 2 (two) times daily.    Dispense:  60 tablet    Refill:  1    Brett Matthews  is a 86 y.o. Caucasian male referred to me for evaluation of new onset atrial fibrillation with rapid ventricular response and also resistant hypertension. His past medical history significant for hyperlipidemia, controlled diabetes mellitus, moderate coronary artery disease in mid circumflex with 60% stenosis by angiography in 2013.  Patient presented to [facility] at [place], [place] on 11/08/2018 with chest tightness, marked generalized weakness and also palpitations, he was found to be in atrial fibrillation with rapid ventricular response again yesterday 01/16/2019 with atypical AFL with RVR.   Low risk stress test and essentially normal LVEF.  Spironolactone is working well with excellent control of blood pressure. He is severely symptomatic with atrial fibrillation/atrial flutter with marked fatigue and generalized weakness and dyspnea.  No clinical evidence of heart failure.  I will start him on flecainide 50 mg b.i.d., he will take 2 tablets b.i.d. today  and tomorrow morning and I'll try to set him up for direct current  cardioversion tomorrow.  I'll see him back in 2 weeks   Adrian Prows, MD, [facility] 01/17/2019, 10:23 AM [facility]

## 2019-01-17 NOTE — H&P (View-Only) (Signed)
Primary Physician/Referring:  Glenda Chroman, MD  Patient ID: Brett Matthews, male    DOB: [birth year], 86 y.o.   MRN: [id number]   Chief Complaint  Patient presents with  . Atrial Fibrillation  . Hypertension  . Hospitalization Follow-up   HPI:    Brett Matthews  is a 86 y.o.  Caucasian male referred to me for evaluation of new onset atrial fibrillation with rapid ventricular response and also  Resistant hypertension. His past medical history significant for hyperlipidemia, controlled diabetes mellitus, moderate coronary artery disease in mid circumflex with 60% stenosis by angiography in 2013.  Patient presented to [facility] at [place], [place] on 11/08/2018 with chest tightness, marked generalized weakness and also palpitations, he was found to be in atrial fibrillation with rapid ventricular response.    He presented again to the emergency department on 01/16/2019 with atypical chest pain and dyspnea  and was found to be back in atypical atrial flutter.  He now presents to the office for follow-up.  His high sensitive serum troponin was mildly elevated at 100 with no delta change. Denies palpitations, but states he is fatigued and has dyspnea over the past one week.   Past Medical History:  Diagnosis Date  . Anemia, iron deficiency    Negative capsule endoscopy  . Arthritis   . Coronary atherosclerosis of native coronary artery    60 % circumflex stenosis; EF 65%, Cath 6/13  . Diverticula of colon    Pancolonic  . DM (diabetes mellitus), type 2, uncontrolled ([place])   . Dyspnea    Normal cardiopulmonary function test in July 2008, negative echocardiogram with "bubble" study for inter-cardiac shunt.  . Essential hypertension, benign   . Gastroesophageal reflux disease   . Hemorrhoids   . Hyperplastic colon polyp 04/06/2007  . Hypothyroidism 2003   Status post total thyroidectomy  . Migraines   . Neoplasm of lymphatic and hematopoietic tissue   . Paroxysmal atrial fibrillation  ([place])    Diagnosed 2005  . Skin cancer    Past Surgical History:  Procedure Laterality Date  . BACK SURGERY    . BONE MARROW BIOPSY  1990's  . CARDIAC CATHETERIZATION    . CATARACT EXTRACTION Bilateral   . COLONOSCOPY  04/06/2007   Dr. Gala Romney- Normal rectum with scattered pancolonic diverticula and slightly redundant elongated colon, diminutive polpy midsigmoid, remainder of colonic mucosa appeared normal. bx= hyperplastic polyp  . COLONOSCOPY N/A 11/29/2012   XYI:[id number] preparation. Friable anal canal/internal hemorrhoids; otherwise, normal rectum. Normal-appearing colonic mucosa  . COLONOSCOPY WITH PROPOFOL N/A 03/12/2017   Procedure: COLONOSCOPY WITH PROPOFOL;  Surgeon: Daneil Dolin, MD;  Location: [facility] ENDO SUITE;  Service: Endoscopy;  Laterality: N/A;  7:30am  . ESOPHAGOGASTRODUODENOSCOPY  04/06/2007   Dr. Gala Romney- normal esophagus s/p nissen fundoplication, intact nissen wrap o/w normal stomach  . ESOPHAGOGASTRODUODENOSCOPY N/A 11/29/2012   RMR: Abnormall distal esophagus bx c/w GERD. prior fundoplication. Gastric polyps  -bx benign. Status post biopsy of normal--appearing duodenal mucosa (bx neg)  . HEMORRHOID SURGERY N/A 04/06/2017   Procedure: EXTENSIVE HEMORRHOIDECTOMY;  Surgeon: Aviva Signs, MD;  Location: [facility] ORS;  Service: General;  Laterality: N/A;  . LEFT HEART CATHETERIZATION WITH CORONARY ANGIOGRAM N/A 06/27/2011   Procedure: LEFT HEART CATHETERIZATION WITH CORONARY ANGIOGRAM;  Surgeon: Thayer Headings, MD;  Location: [facility] CATH LAB;  Service: Cardiovascular;  Laterality: N/A;  . NISSEN FUNDOPLICATION    . POLYPECTOMY  03/12/2017   Procedure: POLYPECTOMY;  Surgeon: Daneil Dolin, MD;  Location: [facility] ENDO SUITE;  Service: Endoscopy;;  cecal x3  . POSTERIOR LAMINECTOMY / DECOMPRESSION LUMBAR SPINE  ~ 1990's  . SKIN CANCER EXCISION     "off my back and chest; from sun"   Social History   Socioeconomic History  . Marital status: Divorced    Spouse name: Not on file  . Number of  children: 2  . Years of education: Not on file  . Highest education level: Not on file  Occupational History  . Occupation: Full Time  Tobacco Use  . Smoking status: Never Smoker  . Smokeless tobacco: Never Used  Substance and Sexual Activity  . Alcohol use: No  . Drug use: No  . Sexual activity: Never  Other Topics Concern  . Not on file  Social History Narrative  . Not on file   Social Determinants of Health   Financial Resource Strain:   . Difficulty of Paying Living Expenses: Not on file  Food Insecurity:   . Worried About Charity fundraiser in the Last Year: Not on file  . Ran Out of Food in the Last Year: Not on file  Transportation Needs:   . Lack of Transportation (Medical): Not on file  . Lack of Transportation (Non-Medical): Not on file  Physical Activity:   . Days of Exercise per Week: Not on file  . Minutes of Exercise per Session: Not on file  Stress:   . Feeling of Stress : Not on file  Social Connections:   . Frequency of Communication with Friends and Family: Not on file  . Frequency of Social Gatherings with Friends and Family: Not on file  . Attends Religious Services: Not on file  . Active Member of Clubs or Organizations: Not on file  . Attends Archivist Meetings: Not on file  . Marital Status: Not on file  Intimate Partner Violence:   . Fear of Current or Ex-Partner: Not on file  . Emotionally Abused: Not on file  . Physically Abused: Not on file  . Sexually Abused: Not on file   ROS  Review of Systems  Constitution: Positive for malaise/fatigue. Negative for weight gain.  Cardiovascular: Positive for dyspnea on exertion. Negative for leg swelling, palpitations and syncope.  Endocrine: Negative for cold intolerance.  Hematologic/Lymphatic: Does not bruise/bleed easily.  Musculoskeletal: Positive for back pain. Negative for joint swelling.  Gastrointestinal: Negative for abdominal pain, anorexia, change in bowel habit, hematochezia  and melena.  Neurological: Negative for headaches and light-headedness.  Psychiatric/Behavioral: Negative for depression and substance abuse.  All other systems reviewed and are negative.  Objective   Vitals with BMI 01/17/2019 01/16/2019 01/16/2019  Height '5\' 9"'$  - -  Weight 201 lbs - -  BMI 20.35 - -  Systolic 597 416 -  Diastolic 69 80 -  Pulse 49 - 103      Physical Exam  Constitutional:  He is well-built and mildly obese in no acute distress.  HENT:  Head: Atraumatic.  Eyes: Conjunctivae are normal.  Neck: No JVD present. No thyromegaly present.  Cardiovascular: Normal rate, intact distal pulses and normal pulses. An irregularly irregular rhythm present. Exam reveals no gallop.  No murmur heard. S1 variable, S2 normal. No leg edema, no JVD.  Pulmonary/Chest: Effort normal and breath sounds normal.  Abdominal: Soft. Bowel sounds are normal.  obese  Musculoskeletal:        General: Normal range of motion.     Cervical back: Neck supple.  Neurological: He is  alert.  Skin: Skin is warm and dry.  Psychiatric: He has a normal mood and affect.   Laboratory examination:   Recent Labs    01/16/19 1345  NA 132*  K 4.2  CL 98  CO2 25  GLUCOSE 212*  BUN 27*  CREATININE 1.14  CALCIUM 9.4  GFRNONAA 59*  GFRAA >60   estimated creatinine clearance is 53.8 mL/min (by C-G formula based on SCr of 1.14 mg/dL).  CMP Latest Ref Rng & Units 01/16/2019 04/08/2017 04/02/2017  Glucose 70 - 99 mg/dL 212(H) 91 98  BUN 8 - 23 mg/dL 27(H) 8 12  Creatinine 0.61 - 1.24 mg/dL 1.14 0.80 0.89  Sodium 135 - 145 mmol/L 132(L) 138 137  Potassium 3.5 - 5.1 mmol/L 4.2 3.8 3.9  Chloride 98 - 111 mmol/L 98 105 103  CO2 22 - 32 mmol/L '25 24 24  '$ Calcium 8.9 - 10.3 mg/dL 9.4 9.8 9.6  Total Protein 6.5 - 8.1 g/dL 6.3(L) 7.3 -  Total Bilirubin 0.3 - 1.2 mg/dL 0.7 0.5 -  Alkaline Phos 38 - 126 U/L 83 86 -  AST 15 - 41 U/L 16 17 -  ALT 0 - 44 U/L 18 17 -   CBC Latest Ref Rng & Units 01/16/2019  04/08/2017 04/02/2017  WBC 4.0 - 10.5 K/uL 8.7 7.0 6.2  Hemoglobin 13.0 - 17.0 g/dL 13.6 12.0(L) 12.0(L)  Hematocrit 39.0 - 52.0 % 42.5 37.5(L) 37.7(L)  Platelets 150 - 400 K/uL 273 284 331   Lipid Panel     Component Value Date/Time   CHOL 184 06/27/2011 0605   TRIG 186 (H) 06/27/2011 0605   HDL 44 06/27/2011 0605   CHOLHDL 4.2 06/27/2011 0605   VLDL 37 06/27/2011 0605   LDLCALC 103 (H) 06/27/2011 0605   HEMOGLOBIN A1C Lab Results  Component Value Date   HGBA1C 6.4 (H) 04/02/2017   MPG 136.98 04/02/2017   TSH No results for input(s): TSH in the last 8760 hours.   Labs 11/08/2018: Serum glucose 149 mg, BUN 16, creatinine 0.89, eGFR >60 mL.  CBC normal.  Labs 12/08/2018: Total cholesterol 173, triglycerides 136, HDL 41, LDL 118. Serum glucose 167 mg, BUN 11, creatinine 0.99, eGFR >70 mL, potassium 3.6.  CMP otherwise normal.  Alkaline phosphatase minimally elevated at 01/07/20.  HB 12.8/HCT 39.9, platelets 297.  TSH normal. PSA mildly elevated.   Medications and allergies  No Known Allergies   Current Outpatient Medications  Medication Instructions  . atorvastatin (LIPITOR) 10 mg, Oral, Daily  . benazepril (LOTENSIN) 40 mg, Oral, Daily  . carvedilol (COREG) 25 mg, Oral, 2 times daily with meals  . chlorthalidone (HYGROTON) 25 mg, Oral, Daily  . citalopram (CELEXA) 20 MG tablet 1 tablet, Oral, Daily  . clonazePAM (KLONOPIN) 0.5 MG tablet 1 tablet, Oral, Daily at bedtime  . cloNIDine (CATAPRES) 0.1 mg, Oral, When top blood pressure >170  . diltiazem (CARDIZEM CD) 240 mg, Oral, Daily  . docusate sodium (COLACE) 100 mg, Oral, Daily PRN  . famotidine (PEPCID) 40 mg, Oral, Daily  . flecainide (TAMBOCOR) 50 mg, Oral, 2 times daily  . fluticasone (FLONASE) 50 MCG/ACT nasal spray 1 spray, Each Nare, 2 times daily  . folic acid (FOLVITE) 1 MG tablet 1 tablet, Oral, Daily  . gabapentin (NEURONTIN) 300 MG capsule 2 capsules, Oral, 2 times daily  . glimepiride (AMARYL) 2 mg, Oral, 2  times daily  . levothyroxine (SYNTHROID) 125 mcg, Oral, Daily before breakfast, SYNTHROID - brand name only   . Rolan Lipa  290 MCG CAPS capsule 1 capsule, Oral, Daily  . metFORMIN (GLUCOPHAGE) 500 MG tablet Take 2 tabs (1,'000mg'$ ) by mouth every morning & 1 tab ('500mg'$ ) every evening  . pantoprazole (PROTONIX) 40 mg, Daily  . rivaroxaban (XARELTO) 20 mg, Oral, Daily with supper  . spironolactone (ALDACTONE) 25 mg, Oral, Daily  . tamsulosin (FLOMAX) 0.4 mg, Oral, Daily after supper   Radiology:   Chest x-ray PA and lateral view 11/08/2018: Normal chest x-ray with no active lung disease.  Cardiac Studies:   Coronary  angiogram 06/27/2011: Mild left main disease, mid circumflex 60% stenosis.  Mild luminal irregularity of the RCA and normal LAD.  LVEF 65%.  Lexiscan Tetrofosmin Stress Test  12/27/2018: Nondiagnostic ECG stress. Normal myocardial perfusion. Stress LV EF: 58%.  No previous exam available for comparison. Low risk study.   Echocardiogram 12/09/2018: Left ventricle cavity is normal in size. Moderate concentric hypertrophy of the left ventricle. Normal LV systolic function with EF 65%. Normal global wall motion. Doppler evidence of grade I (impaired) diastolic dysfunction, normal LAP. Mild (Grade I) mitral regurgitation. Mild tricuspid regurgitation. Estimated pulmonary artery systolic pressure is 23 mmHg.  Assessment     ICD-10-CM   1. Paroxysmal atrial fibrillation (HCC) CHA2DS2-VASc Score is 4.  Yearly risk of stroke: 4%(A, HTN, DM)   I48.0 EKG 12-Lead  2. Coronary artery disease involving native coronary artery of native heart without angina pectoris  I25.10   3. Essential hypertension, benign  I10   4. Mixed hyperlipidemia  E78.2    EKG 01/17/2019: Atypical atrial flutter with variable ventricular response at the rate of 110 bpm, left axis deviation, left anterior fascicular block.  IVCD.  Poor R-wave progression, cannot exclude anterolateral infarct old.  No evidence of  ischemia.  Low-voltage limb leads, consider COPD.   EKG 01/10/2019: Sinus bradycardia at rate of 59 bpm, left atrial enlargement, left axis deviation, left anterior fascicular block.  Poor R-wave progression, anteroseptal infarct old.  Nonspecific T abnormality. No significant change from  EKG 12/06/2018   EKG 11/05/2018: Atrial fibrillation with rapid ventricular response at the rate of 110 bpm, left axis deviation, left anterior fascicular block.  Poor R-wave progression, cannot exclude anterolateral infarct old.  Nonspecific T abnormality. EKG 11/05/2018: Few minutes before: SVT, probably atrial flutter with 2:1 conduction, ventricular rate 150 bpm.  Recommendations:   Meds ordered this encounter  Medications  . flecainide (TAMBOCOR) 50 MG tablet    Sig: Take 1 tablet (50 mg total) by mouth 2 (two) times daily.    Dispense:  60 tablet    Refill:  1    Brett Matthews  is a 86 y.o. Caucasian male referred to me for evaluation of new onset atrial fibrillation with rapid ventricular response and also resistant hypertension. His past medical history significant for hyperlipidemia, controlled diabetes mellitus, moderate coronary artery disease in mid circumflex with 60% stenosis by angiography in 2013.  Patient presented to [facility] at [place], [place] on 11/08/2018 with chest tightness, marked generalized weakness and also palpitations, he was found to be in atrial fibrillation with rapid ventricular response again yesterday 01/16/2019 with atypical AFL with RVR.   Low risk stress test and essentially normal LVEF.  Spironolactone is working well with excellent control of blood pressure. He is severely symptomatic with atrial fibrillation/atrial flutter with marked fatigue and generalized weakness and dyspnea.  No clinical evidence of heart failure.  I will start him on flecainide 50 mg b.i.d., he will take 2 tablets b.i.d. today  and tomorrow morning and I'll try to set him up for direct current  cardioversion tomorrow.  I'll see him back in 2 weeks   Adrian Prows, MD, [facility] 01/17/2019, 10:23 AM [facility] [place]

## 2019-01-17 NOTE — Telephone Encounter (Signed)
Ok to fill 

## 2019-01-17 NOTE — Patient Instructions (Signed)
Take Flecainide 2 tablets twice daily for today and tomorrow morning and then one tablet twice daily.

## 2019-01-18 ENCOUNTER — Ambulatory Visit (HOSPITAL_COMMUNITY): Payer: PPO | Admitting: Certified Registered Nurse Anesthetist

## 2019-01-18 ENCOUNTER — Encounter (HOSPITAL_COMMUNITY): Payer: Self-pay | Admitting: Cardiology

## 2019-01-18 ENCOUNTER — Other Ambulatory Visit: Payer: Self-pay

## 2019-01-18 ENCOUNTER — Encounter (HOSPITAL_COMMUNITY)
Admission: RE | Disposition: A | Discharge status: Home / Self Care | Payer: Self-pay | Source: Home / Self Care | Attending: Cardiology

## 2019-01-18 ENCOUNTER — Ambulatory Visit (HOSPITAL_COMMUNITY)
Admission: RE | Admit: 2019-01-18 | Discharge: 2019-01-18 | Disposition: A | Discharge status: Home / Self Care | Payer: PPO | Attending: Cardiology | Admitting: Cardiology

## 2019-01-18 DIAGNOSIS — E039 Hypothyroidism, unspecified: Secondary | ICD-10-CM | POA: Diagnosis not present

## 2019-01-18 DIAGNOSIS — I480 Paroxysmal atrial fibrillation: Secondary | ICD-10-CM | POA: Insufficient documentation

## 2019-01-18 DIAGNOSIS — I2510 Atherosclerotic heart disease of native coronary artery without angina pectoris: Secondary | ICD-10-CM | POA: Insufficient documentation

## 2019-01-18 DIAGNOSIS — Z79890 Hormone replacement therapy (postmenopausal): Secondary | ICD-10-CM | POA: Insufficient documentation

## 2019-01-18 DIAGNOSIS — Z7901 Long term (current) use of anticoagulants: Secondary | ICD-10-CM | POA: Diagnosis not present

## 2019-01-18 DIAGNOSIS — I10 Essential (primary) hypertension: Secondary | ICD-10-CM | POA: Diagnosis not present

## 2019-01-18 DIAGNOSIS — I4891 Unspecified atrial fibrillation: Secondary | ICD-10-CM | POA: Diagnosis not present

## 2019-01-18 DIAGNOSIS — K219 Gastro-esophageal reflux disease without esophagitis: Secondary | ICD-10-CM | POA: Insufficient documentation

## 2019-01-18 DIAGNOSIS — Z7984 Long term (current) use of oral hypoglycemic drugs: Secondary | ICD-10-CM | POA: Diagnosis not present

## 2019-01-18 DIAGNOSIS — E782 Mixed hyperlipidemia: Secondary | ICD-10-CM | POA: Insufficient documentation

## 2019-01-18 DIAGNOSIS — E119 Type 2 diabetes mellitus without complications: Secondary | ICD-10-CM | POA: Insufficient documentation

## 2019-01-18 DIAGNOSIS — Z79899 Other long term (current) drug therapy: Secondary | ICD-10-CM | POA: Insufficient documentation

## 2019-01-18 HISTORY — PX: CARDIOVERSION: SHX1299

## 2019-01-18 LAB — GLUCOSE, CAPILLARY: Glucose-Capillary: 217 mg/dL — ABNORMAL HIGH (ref 70–99)

## 2019-01-18 SURGERY — CARDIOVERSION
Anesthesia: General

## 2019-01-18 MED ORDER — BENAZEPRIL HCL 40 MG PO TABS: 20.0000 mg | ORAL_TABLET | Freq: Every day | ORAL | Status: DC

## 2019-01-18 MED ORDER — PHENYLEPHRINE 40 MCG/ML (10ML) SYRINGE FOR IV PUSH (FOR BLOOD PRESSURE SUPPORT)
PREFILLED_SYRINGE | INTRAVENOUS | Status: DC | PRN
  Administered 2019-01-18: 120 ug via INTRAVENOUS
  Administered 2019-01-18: 80 ug via INTRAVENOUS
  Administered 2019-01-18: 120 ug via INTRAVENOUS
  Administered 2019-01-18: 80 ug via INTRAVENOUS

## 2019-01-18 MED ORDER — ALBUMIN HUMAN 5 % IV SOLN
INTRAVENOUS | Status: AC
  Filled 2019-01-18: qty 250

## 2019-01-18 MED ORDER — PROPOFOL 10 MG/ML IV BOLUS
INTRAVENOUS | Status: DC | PRN
  Administered 2019-01-18: 60 mg via INTRAVENOUS

## 2019-01-18 MED ORDER — ALBUMIN HUMAN 5 % IV SOLN
12.5000 g | Freq: Once | INTRAVENOUS | Status: AC
  Administered 2019-01-18: 12.5 g via INTRAVENOUS

## 2019-01-18 MED ORDER — LIDOCAINE 2% (20 MG/ML) 5 ML SYRINGE
INTRAMUSCULAR | Status: DC | PRN
  Administered 2019-01-18: 40 mg via INTRAVENOUS

## 2019-01-18 MED ORDER — SODIUM CHLORIDE 0.9 % IV SOLN: INTRAVENOUS | Status: DC

## 2019-01-18 NOTE — CV Procedure (Signed)
Direct current cardioversion:  Indication symptomatic A. Fibrillation.  Procedure: Using 60 mg of IV Propofol and 40 IV Lidocaine (for reducing venous pain) for achieving deep sedation, synchronized direct current cardioversion performed. Patient was delivered with 120 Joules of electricity X 1 with success to NSR. Patient tolerated the procedure well. No immediate complication noted.   Adrian Prows, MD, [facility] 01/18/2019, 11:20 AM [facility]. [place] Office: [phone]

## 2019-01-18 NOTE — Transfer of Care (Signed)
Immediate Anesthesia Transfer of Care Note  Patient: Brett Matthews  Procedure(s) Performed: CARDIOVERSION (N/A )  Patient Location: Endoscopy Unit  Anesthesia Type:General  Level of Consciousness: awake, alert  and oriented  Airway & Oxygen Therapy: Patient Spontanous Breathing  Post-op Assessment: Report given to RN, Post -op Vital signs reviewed and stable and Patient moving all extremities X 4  Post vital signs: Reviewed and stable  Last Vitals:  Vitals Value Taken Time  BP 84/51 01/18/19 1133  Temp 36.7 C 01/18/19 1133  Pulse 74 01/18/19 1133  Resp 14 01/18/19 1133  SpO2 96 % 01/18/19 1133    Last Pain:  Vitals:   01/18/19 1133  TempSrc: Temporal  PainSc: 6          Complications: No apparent anesthesia complications

## 2019-01-18 NOTE — Discharge Instructions (Signed)
Electrical Cardioversion Electrical cardioversion is the delivery of a jolt of electricity to restore a normal rhythm to the heart. A rhythm that is too fast or is not regular keeps the heart from pumping well. In this procedure, sticky patches or metal paddles are placed on the chest to deliver electricity to the heart from a device. This procedure may be done in an emergency if:  There is low or no blood pressure as a result of the heart rhythm.  Normal rhythm must be restored as fast as possible to protect the brain and heart from further damage.  It may save a life. This may also be a scheduled procedure for irregular or fast heart rhythms that are not immediately life-threatening. Tell a health care provider about:  Any allergies you have.  All medicines you are taking, including vitamins, herbs, eye drops, creams, and over-the-counter medicines.  Any problems you or family members have had with anesthetic medicines.  Any blood disorders you have.  Any surgeries you have had.  Any medical conditions you have.  Whether you are pregnant or may be pregnant. What are the risks? Generally, this is a safe procedure. However, problems may occur, including:  Allergic reactions to medicines.  A blood clot that breaks free and travels to other parts of your body.  The possible return of an abnormal heart rhythm within hours or days after the procedure.  Your heart stopping (cardiac arrest). This is rare. What happens before the procedure? Medicines  Your health care provider may have you start taking: ? Blood-thinning medicines (anticoagulants) so your blood does not clot as easily. ? Medicines to help stabilize your heart rate and rhythm.  Ask your health care provider about: ? Changing or stopping your regular medicines. This is especially important if you are taking diabetes medicines or blood thinners. ? Taking medicines such as aspirin and ibuprofen. These medicines can  thin your blood. Do not take these medicines unless your health care provider tells you to take them. ? Taking over-the-counter medicines, vitamins, herbs, and supplements. General instructions  Follow instructions from your health care provider about eating or drinking restrictions.  Plan to have someone take you home from the hospital or clinic.  If you will be going home right after the procedure, plan to have someone with you for 24 hours.  Ask your health care provider what steps will be taken to help prevent infection. These may include washing your skin with a germ-killing soap. What happens during the procedure?   An IV will be inserted into one of your veins.  Sticky patches (electrodes) or metal paddles may be placed on your chest.  You will be given a medicine to help you relax (sedative).  An electrical shock will be delivered. The procedure may vary among health care providers and hospitals. What can I expect after the procedure?  Your blood pressure, heart rate, breathing rate, and blood oxygen level will be monitored until you leave the hospital or clinic.  Your heart rhythm will be watched to make sure it does not change.  You may have some redness on the skin where the shocks were given. Follow these instructions at home:  Do not drive for 24 hours if you were given a sedative during your procedure.  Take over-the-counter and prescription medicines only as told by your health care provider.  Ask your health care provider how to check your pulse. Check it often.  Rest for 48 hours after the procedure or   as told by your health care provider.  Avoid or limit your caffeine use as told by your health care provider.  Keep all follow-up visits as told by your health care provider. This is important. Contact a health care provider if:  You feel like your heart is beating too quickly or your pulse is not regular.  You have a serious muscle cramp that does not go  away. Get help right away if:  You have discomfort in your chest.  You are dizzy or you feel faint.  You have trouble breathing or you are short of breath.  Your speech is slurred.  You have trouble moving an arm or leg on one side of your body.  Your fingers or toes turn cold or blue. Summary  Electrical cardioversion is the delivery of a jolt of electricity to restore a normal rhythm to the heart.  This procedure may be done right away in an emergency or may be a scheduled procedure if the condition is not an emergency.  Generally, this is a safe procedure.  After the procedure, check your pulse often as told by your health care provider. This information is not intended to replace advice given to you by your health care provider. Make sure you discuss any questions you have with your health care provider. Document Revised: 07/26/2018 Document Reviewed: 07/26/2018 Elsevier Patient Education  2020 [employer] Inc[employer]  

## 2019-01-18 NOTE — Interval H&P Note (Signed)
History and Physical Interval Note:  01/18/2019 11:15 AM  Brett Matthews  has presented today for surgery, with the diagnosis of atrial fibrillation.  The various methods of treatment have been discussed with the patient and family. After consideration of risks, benefits and other options for treatment, the patient has consented to  Procedure(s): CARDIOVERSION (N/A) as a surgical intervention.  The patient's history has been reviewed, patient examined, no change in status, stable for surgery.  I have reviewed the patient's chart and labs.  Questions were answered to the patient's satisfaction.     Adrian Prows

## 2019-01-18 NOTE — Anesthesia Preprocedure Evaluation (Addendum)
Anesthesia Evaluation  Patient identified by MRN, date of birth, ID band Patient awake    Reviewed: Allergy & Precautions, NPO status , Patient's Chart, lab work & pertinent test results  Airway Mallampati: II  TM Distance: >3 FB Neck ROM: Full    Dental  (+) Dental Advisory Given, Teeth Intact   Pulmonary neg pulmonary ROS,    breath sounds clear to auscultation       Cardiovascular hypertension, Pt. on home beta blockers + CAD  + dysrhythmias Atrial Fibrillation  Rhythm:Irregular Rate:Abnormal     Neuro/Psych  Headaches, negative psych ROS   GI/Hepatic Neg liver ROS, GERD  Medicated,  Endo/Other  diabetes, Type 2, Oral Hypoglycemic AgentsHypothyroidism   Renal/GU negative Renal ROS     Musculoskeletal  (+) Arthritis ,   Abdominal Normal abdominal exam  (+)   Peds  Hematology negative hematology ROS (+)   Anesthesia Other Findings   Reproductive/Obstetrics                           Anesthesia Physical Anesthesia Plan  ASA: III  Anesthesia Plan: General   Post-op Pain Management:    Induction:   PONV Risk Score and Plan: 0  Airway Management Planned: Natural Airway and Nasal Cannula  Additional Equipment: None  Intra-op Plan:   Post-operative Plan:   Informed Consent: I have reviewed the patients History and Physical, chart, labs and discussed the procedure including the risks, benefits and alternatives for the proposed anesthesia with the patient or authorized representative who has indicated his/her understanding and acceptance.       Plan Discussed with: CRNA  Anesthesia Plan Comments:        Anesthesia Quick Evaluation

## 2019-01-18 NOTE — Anesthesia Procedure Notes (Signed)
Procedure Name: General with mask airway Date/Time: 01/18/2019 10:55 AM Performed by: Harden Mo, CRNA Pre-anesthesia Checklist: Patient identified, Emergency Drugs available, Suction available and Patient being monitored Patient Re-evaluated:Patient Re-evaluated prior to induction Oxygen Delivery Method: Ambu bag Preoxygenation: Pre-oxygenation with 100% oxygen Induction Type: IV induction Placement Confirmation: positive ETCO2 and breath sounds checked- equal and bilateral Dental Injury: Teeth and Oropharynx as per pre-operative assessment

## 2019-01-19 ENCOUNTER — Encounter: Payer: Self-pay | Admitting: *Deleted

## 2019-01-20 DIAGNOSIS — S46012D Strain of muscle(s) and tendon(s) of the rotator cuff of left shoulder, subsequent encounter: Secondary | ICD-10-CM | POA: Diagnosis not present

## 2019-01-20 NOTE — Anesthesia Postprocedure Evaluation (Signed)
Anesthesia Post Note  Patient: Brett Matthews  Procedure(s) Performed: CARDIOVERSION (N/A )     Patient location during evaluation: PACU Anesthesia Type: General Level of consciousness: awake and alert Pain management: pain level controlled Vital Signs Assessment: post-procedure vital signs reviewed and stable Respiratory status: spontaneous breathing, nonlabored ventilation, respiratory function stable and patient connected to nasal cannula oxygen Cardiovascular status: blood pressure returned to baseline and stable Postop Assessment: no apparent nausea or vomiting Anesthetic complications: no    Last Vitals:  Vitals:   01/18/19 1300 01/18/19 1303  BP: (!) 119/56 116/62  Pulse: 81 82  Resp: 19 16  Temp:    SpO2: 97% 98%    Last Pain:  Vitals:   01/19/19 0903  TempSrc:   PainSc: 0-No pain   Pain Goal:                   [name]

## 2019-01-24 ENCOUNTER — Encounter: Payer: Self-pay | Admitting: Cardiology

## 2019-01-24 DIAGNOSIS — I10 Essential (primary) hypertension: Secondary | ICD-10-CM | POA: Diagnosis not present

## 2019-01-31 ENCOUNTER — Encounter: Payer: Self-pay | Admitting: Cardiology

## 2019-01-31 ENCOUNTER — Ambulatory Visit (INDEPENDENT_AMBULATORY_CARE_PROVIDER_SITE_OTHER): Payer: PPO | Admitting: Cardiology

## 2019-01-31 ENCOUNTER — Other Ambulatory Visit: Payer: Self-pay

## 2019-01-31 VITALS — BP 122/73 | HR 66 | Temp 97.2°F | Ht 69.0 in | Wt 199.3 lb

## 2019-01-31 DIAGNOSIS — I10 Essential (primary) hypertension: Secondary | ICD-10-CM

## 2019-01-31 DIAGNOSIS — I480 Paroxysmal atrial fibrillation: Secondary | ICD-10-CM | POA: Diagnosis not present

## 2019-01-31 NOTE — Progress Notes (Signed)
Primary Physician/Referring:  Glenda Chroman, MD  Patient ID: Brett Matthews, male    DOB: 1932-06-14, 86 y.o.   MRN: [id number]   No chief complaint on file.  HPI:    Brett Matthews  is a 86 y.o.  Caucasian male with paroxysmal atrial fibrillation, resistant hypertension, hyperlipidemia, controlled diabetes mellitus, moderate coronary artery disease in mid circumflex with 60% stenosis by angiography in 2013, underwent direct-current cardioversion on 01/18/2019 and presents for follow-up. I had started him on flecainide.    Patient presented to [facility] at [place], [place] on 11/08/2018 with chest tightness, marked generalized weakness and also palpitations, he was found to be in atrial fibrillation with rapid ventricular response.  He presented again to the emergency department on 01/16/2019 with atypical chest pain and dyspnea  and was found to be back in atypical atrial flutter.  States that he is doing well and fatigue has improved, except for fluctuation in blood pressure where sometimes it is high, no other specific complaints today.  Tolerating anticoagulation well.  Past Medical History:  Diagnosis Date  . Anemia, iron deficiency    Negative capsule endoscopy  . Arthritis   . Coronary atherosclerosis of native coronary artery    60 % circumflex stenosis; EF 65%, Cath 6/13  . Diverticula of colon    Pancolonic  . DM (diabetes mellitus), type 2, uncontrolled ([place])   . Dyspnea    Normal cardiopulmonary function test in July 2008, negative echocardiogram with "bubble" study for inter-cardiac shunt.  . Essential hypertension, benign   . Gastroesophageal reflux disease   . Hemorrhoids   . Hyperplastic colon polyp 04/06/2007  . Hypothyroidism 2003   Status post total thyroidectomy  . Migraines   . Neoplasm of lymphatic and hematopoietic tissue   . Paroxysmal atrial fibrillation ([place])    Diagnosed 2005  . Skin cancer    Past Surgical History:  Procedure Laterality Date  . BACK  SURGERY    . BONE MARROW BIOPSY  1990's  . CARDIAC CATHETERIZATION    . CARDIOVERSION N/A 01/18/2019   Procedure: CARDIOVERSION;  Surgeon: Adrian Prows, MD;  Location: [facility] ENDOSCOPY;  Service: Cardiovascular;  Laterality: N/A;  . CATARACT EXTRACTION Bilateral   . COLONOSCOPY  04/06/2007   Dr. Gala Romney- Normal rectum with scattered pancolonic diverticula and slightly redundant elongated colon, diminutive polpy midsigmoid, remainder of colonic mucosa appeared normal. bx= hyperplastic polyp  . COLONOSCOPY N/A 11/29/2012   WUJ:[id number] preparation. Friable anal canal/internal hemorrhoids; otherwise, normal rectum. Normal-appearing colonic mucosa  . COLONOSCOPY WITH PROPOFOL N/A 03/12/2017   Procedure: COLONOSCOPY WITH PROPOFOL;  Surgeon: Daneil Dolin, MD;  Location: [facility] ENDO SUITE;  Service: Endoscopy;  Laterality: N/A;  7:30am  . ESOPHAGOGASTRODUODENOSCOPY  04/06/2007   Dr. Gala Romney- normal esophagus s/p nissen fundoplication, intact nissen wrap o/w normal stomach  . ESOPHAGOGASTRODUODENOSCOPY N/A 11/29/2012   RMR: Abnormall distal esophagus bx c/w GERD. prior fundoplication. Gastric polyps  -bx benign. Status post biopsy of normal--appearing duodenal mucosa (bx neg)  . HEMORRHOID SURGERY N/A 04/06/2017   Procedure: EXTENSIVE HEMORRHOIDECTOMY;  Surgeon: Aviva Signs, MD;  Location: [facility] ORS;  Service: General;  Laterality: N/A;  . LEFT HEART CATHETERIZATION WITH CORONARY ANGIOGRAM N/A 06/27/2011   Procedure: LEFT HEART CATHETERIZATION WITH CORONARY ANGIOGRAM;  Surgeon: Thayer Headings, MD;  Location: [facility] CATH LAB;  Service: Cardiovascular;  Laterality: N/A;  . NISSEN FUNDOPLICATION    . POLYPECTOMY  03/12/2017   Procedure: POLYPECTOMY;  Surgeon: Daneil Dolin, MD;  Location: [facility]  ENDO SUITE;  Service: Endoscopy;;  cecal x3  . POSTERIOR LAMINECTOMY / DECOMPRESSION LUMBAR SPINE  ~ 1990's  . SKIN CANCER EXCISION     "off my back and chest; from sun"   Social History   Socioeconomic History  . Marital status:  Divorced    Spouse name: Not on file  . Number of children: 2  . Years of education: Not on file  . Highest education level: Not on file  Occupational History  . Occupation: Full Time  Tobacco Use  . Smoking status: Never Smoker  . Smokeless tobacco: Never Used  Substance and Sexual Activity  . Alcohol use: No  . Drug use: No  . Sexual activity: Never  Other Topics Concern  . Not on file  Social History Narrative  . Not on file   Social Determinants of Health   Financial Resource Strain:   . Difficulty of Paying Living Expenses: Not on file  Food Insecurity:   . Worried About Charity fundraiser in the Last Year: Not on file  . Ran Out of Food in the Last Year: Not on file  Transportation Needs:   . Lack of Transportation (Medical): Not on file  . Lack of Transportation (Non-Medical): Not on file  Physical Activity:   . Days of Exercise per Week: Not on file  . Minutes of Exercise per Session: Not on file  Stress:   . Feeling of Stress : Not on file  Social Connections:   . Frequency of Communication with Friends and Family: Not on file  . Frequency of Social Gatherings with Friends and Family: Not on file  . Attends Religious Services: Not on file  . Active Member of Clubs or Organizations: Not on file  . Attends Archivist Meetings: Not on file  . Marital Status: Not on file  Intimate Partner Violence:   . Fear of Current or Ex-Partner: Not on file  . Emotionally Abused: Not on file  . Physically Abused: Not on file  . Sexually Abused: Not on file   ROS  Review of Systems  Constitution: Positive for malaise/fatigue. Negative for weight gain.  Cardiovascular: Positive for dyspnea on exertion. Negative for leg swelling, palpitations and syncope.  Endocrine: Negative for cold intolerance.  Hematologic/Lymphatic: Does not bruise/bleed easily.  Musculoskeletal: Positive for back pain. Negative for joint swelling.  Gastrointestinal: Negative for abdominal  pain, anorexia, change in bowel habit, hematochezia and melena.  Neurological: Negative for headaches and light-headedness.  Psychiatric/Behavioral: Negative for depression and substance abuse.  All other systems reviewed and are negative.  Objective   Vitals with BMI 01/18/2019 01/18/2019 01/18/2019  Height - - -  Weight - - -  BMI - - -  Systolic 086 578 469  Diastolic 62 56 60  Pulse 82 81 81      Physical Exam  Constitutional:  He is well-built and mildly obese in no acute distress.  HENT:  Head: Atraumatic.  Eyes: Conjunctivae are normal.  Neck: No JVD present. No thyromegaly present.  Cardiovascular: Normal rate, intact distal pulses and normal pulses. An irregularly irregular rhythm present. Exam reveals no gallop.  No murmur heard. S1 variable, S2 normal. No leg edema, no JVD.  Pulmonary/Chest: Effort normal and breath sounds normal.  Abdominal: Soft. Bowel sounds are normal.  obese  Musculoskeletal:        General: Normal range of motion.     Cervical back: Neck supple.  Neurological: He is alert.  Skin: Skin  is warm and dry.  Psychiatric: He has a normal mood and affect.   Laboratory examination:   Recent Labs    01/16/19 1345  NA 132*  K 4.2  CL 98  CO2 25  GLUCOSE 212*  BUN 27*  CREATININE 1.14  CALCIUM 9.4  GFRNONAA 59*  GFRAA >60   CrCl cannot be calculated (Unknown ideal weight.).  CMP Latest Ref Rng & Units 01/16/2019 04/08/2017 04/02/2017  Glucose 70 - 99 mg/dL 212(H) 91 98  BUN 8 - 23 mg/dL 27(H) 8 12  Creatinine 0.61 - 1.24 mg/dL 1.14 0.80 0.89  Sodium 135 - 145 mmol/L 132(L) 138 137  Potassium 3.5 - 5.1 mmol/L 4.2 3.8 3.9  Chloride 98 - 111 mmol/L 98 105 103  CO2 22 - 32 mmol/L '25 24 24  '$ Calcium 8.9 - 10.3 mg/dL 9.4 9.8 9.6  Total Protein 6.5 - 8.1 g/dL 6.3(L) 7.3 -  Total Bilirubin 0.3 - 1.2 mg/dL 0.7 0.5 -  Alkaline Phos 38 - 126 U/L 83 86 -  AST 15 - 41 U/L 16 17 -  ALT 0 - 44 U/L 18 17 -   CBC Latest Ref Rng & Units 01/16/2019  04/08/2017 04/02/2017  WBC 4.0 - 10.5 K/uL 8.7 7.0 6.2  Hemoglobin 13.0 - 17.0 g/dL 13.6 12.0(L) 12.0(L)  Hematocrit 39.0 - 52.0 % 42.5 37.5(L) 37.7(L)  Platelets 150 - 400 K/uL 273 284 331   Lipid Panel     Component Value Date/Time   CHOL 184 06/27/2011 0605   TRIG 186 (H) 06/27/2011 0605   HDL 44 06/27/2011 0605   CHOLHDL 4.2 06/27/2011 0605   VLDL 37 06/27/2011 0605   LDLCALC 103 (H) 06/27/2011 0605   HEMOGLOBIN A1C Lab Results  Component Value Date   HGBA1C 6.4 (H) 04/02/2017   MPG 136.98 04/02/2017   TSH No results for input(s): TSH in the last 8760 hours.   Labs 11/08/2018: Serum glucose 149 mg, BUN 16, creatinine 0.89, eGFR >60 mL.  CBC normal.  Labs 12/08/2018: Total cholesterol 173, triglycerides 136, HDL 41, LDL 118. Serum glucose 167 mg, BUN 11, creatinine 0.99, eGFR >70 mL, potassium 3.6.  CMP otherwise normal.  Alkaline phosphatase minimally elevated at 01/07/20.  HB 12.8/HCT 39.9, platelets 297.  TSH normal. PSA mildly elevated.   Medications and allergies  No Known Allergies   Current Outpatient Medications  Medication Instructions  . atorvastatin (LIPITOR) 10 mg, Oral, Daily  . benazepril (LOTENSIN) 20 mg, Oral, Daily  . carvedilol (COREG) 25 mg, Oral, 2 times daily with meals  . chlorthalidone (HYGROTON) 25 mg, Oral, Daily  . citalopram (CELEXA) 20 MG tablet 1 tablet, Oral, Daily  . clonazePAM (KLONOPIN) 0.5 MG tablet 1 tablet, Oral, Daily at bedtime  . cloNIDine (CATAPRES) 0.1 mg, Oral, When top blood pressure >170  . diltiazem (CARDIZEM CD) 240 mg, Oral, Daily  . docusate sodium (COLACE) 100 mg, Oral, Daily PRN  . famotidine (PEPCID) 40 mg, Oral, Daily  . flecainide (TAMBOCOR) 50 MG tablet TAKE 1 TABLET(50 MG) BY MOUTH TWICE DAILY  . fluticasone (FLONASE) 50 MCG/ACT nasal spray 1 spray, Each Nare, 2 times daily  . folic acid (FOLVITE) 1 MG tablet 1 tablet, Oral, Daily  . gabapentin (NEURONTIN) 300 MG capsule 2 capsules, Oral, 2 times daily  . glimepiride  (AMARYL) 2 mg, Oral, 2 times daily  . levothyroxine (SYNTHROID) 125 mcg, Oral, Daily before breakfast, SYNTHROID - brand name only   . LINZESS 290 MCG CAPS capsule 1 capsule, Oral,  Daily  . metFORMIN (GLUCOPHAGE) 500 MG tablet Take 2 tabs (1,'000mg'$ ) by mouth every morning & 1 tab ('500mg'$ ) every evening  . pantoprazole (PROTONIX) 40 mg, Daily  . rivaroxaban (XARELTO) 20 mg, Oral, Daily with supper  . spironolactone (ALDACTONE) 25 mg, Oral, Daily  . tamsulosin (FLOMAX) 0.4 mg, Oral, Daily after supper   Radiology:   Chest x-ray PA and lateral view 11/08/2018: Normal chest x-ray with no active lung disease.  Cardiac Studies:   Coronary  angiogram 06/27/2011: Mild left main disease, mid circumflex 60% stenosis.  Mild luminal irregularity of the RCA and normal LAD.  LVEF 65%.  Lexiscan Tetrofosmin Stress Test  12/27/2018: Nondiagnostic ECG stress. Normal myocardial perfusion. Stress LV EF: 58%.  No previous exam available for comparison. Low risk study.   Echocardiogram 12/09/2018: Left ventricle cavity is normal in size. Moderate concentric hypertrophy of the left ventricle. Normal LV systolic function with EF 65%. Normal global wall motion. Doppler evidence of grade I (impaired) diastolic dysfunction, normal LAP. Mild (Grade I) mitral regurgitation. Mild tricuspid regurgitation. Estimated pulmonary artery systolic pressure is 23 mmHg.  Assessment     ICD-10-CM   1. Paroxysmal atrial fibrillation (HCC) CHA2DS2-VASc Score is 4.  Yearly risk of stroke: 4%(A, HTN, DM)   I48.0   2. Essential hypertension, benign  I10    EKG 01/31/2019: Sinus bradycardia at rate of 54 bpm, left axis deviation, left anterior block.  Poor R progression, cannot exclude anterolateral infarct old.  Normal QT interval.  No evidence of ischemia. No significant change from  01/10/2019.  EKG 01/17/2019: Atypical atrial flutter with variable ventricular response at the rate of 110 bpm, left axis deviation, left anterior  fascicular block.  IVCD.  Poor R-wave progression, cannot exclude anterolateral infarct old.  No evidence of ischemia.  Low-voltage limb leads, consider COPD.  Recommendations:   No orders of the defined types were placed in this encounter.   Brett Matthews  is a 86 y.o. Caucasian male with paroxysmal atrial fibrillation, resistant hypertension, hyperlipidemia, controlled diabetes mellitus, moderate coronary artery disease in mid circumflex with 60% stenosis by angiography in 2013, underwent direct-current cardioversion on 01/18/2019 and presents for follow-up. I had started him on flecainide.  He has had a low risk stress test and essentially normal LVEF.    Spironolactone is working well with excellent control of blood pressure. He is severely symptomatic with atrial fibrillation/atrial flutter with marked fatigue and generalized weakness and dyspnea.  No clinical evidence of heart failure.  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  Hence I feel comfortable in continuing to use flecainide at a low dose.  I will see him back in 6 months or sooner if problems.  Adrian Prows, MD, [facility] 01/31/2019, 12:48 PM [facility]

## 2019-02-04 DIAGNOSIS — I10 Essential (primary) hypertension: Secondary | ICD-10-CM | POA: Diagnosis not present

## 2019-02-04 DIAGNOSIS — D692 Other nonthrombocytopenic purpura: Secondary | ICD-10-CM | POA: Diagnosis not present

## 2019-02-04 DIAGNOSIS — Z713 Dietary counseling and surveillance: Secondary | ICD-10-CM | POA: Diagnosis not present

## 2019-02-04 DIAGNOSIS — E1142 Type 2 diabetes mellitus with diabetic polyneuropathy: Secondary | ICD-10-CM | POA: Diagnosis not present

## 2019-02-04 DIAGNOSIS — E1165 Type 2 diabetes mellitus with hyperglycemia: Secondary | ICD-10-CM | POA: Diagnosis not present

## 2019-02-04 DIAGNOSIS — Z299 Encounter for prophylactic measures, unspecified: Secondary | ICD-10-CM | POA: Diagnosis not present

## 2019-02-04 DIAGNOSIS — I4891 Unspecified atrial fibrillation: Secondary | ICD-10-CM | POA: Diagnosis not present

## 2019-02-08 DIAGNOSIS — S46012D Strain of muscle(s) and tendon(s) of the rotator cuff of left shoulder, subsequent encounter: Secondary | ICD-10-CM | POA: Diagnosis not present

## 2019-02-08 DIAGNOSIS — M170 Bilateral primary osteoarthritis of knee: Secondary | ICD-10-CM | POA: Diagnosis not present

## 2019-02-11 DIAGNOSIS — E039 Hypothyroidism, unspecified: Secondary | ICD-10-CM | POA: Diagnosis not present

## 2019-02-11 DIAGNOSIS — K219 Gastro-esophageal reflux disease without esophagitis: Secondary | ICD-10-CM | POA: Diagnosis not present

## 2019-02-11 DIAGNOSIS — S46012A Strain of muscle(s) and tendon(s) of the rotator cuff of left shoulder, initial encounter: Secondary | ICD-10-CM | POA: Diagnosis not present

## 2019-02-11 DIAGNOSIS — E119 Type 2 diabetes mellitus without complications: Secondary | ICD-10-CM | POA: Diagnosis not present

## 2019-02-11 DIAGNOSIS — I10 Essential (primary) hypertension: Secondary | ICD-10-CM | POA: Diagnosis not present

## 2019-02-11 DIAGNOSIS — Z01818 Encounter for other preprocedural examination: Secondary | ICD-10-CM | POA: Diagnosis not present

## 2019-02-11 DIAGNOSIS — M75122 Complete rotator cuff tear or rupture of left shoulder, not specified as traumatic: Secondary | ICD-10-CM | POA: Diagnosis not present

## 2019-02-11 DIAGNOSIS — I4891 Unspecified atrial fibrillation: Secondary | ICD-10-CM | POA: Diagnosis not present

## 2019-02-11 DIAGNOSIS — E785 Hyperlipidemia, unspecified: Secondary | ICD-10-CM | POA: Diagnosis not present

## 2019-02-15 DIAGNOSIS — M75122 Complete rotator cuff tear or rupture of left shoulder, not specified as traumatic: Secondary | ICD-10-CM | POA: Diagnosis not present

## 2019-02-15 DIAGNOSIS — M75102 Unspecified rotator cuff tear or rupture of left shoulder, not specified as traumatic: Secondary | ICD-10-CM | POA: Diagnosis not present

## 2019-02-28 DIAGNOSIS — S46012D Strain of muscle(s) and tendon(s) of the rotator cuff of left shoulder, subsequent encounter: Secondary | ICD-10-CM | POA: Diagnosis not present

## 2019-03-02 DIAGNOSIS — H6123 Impacted cerumen, bilateral: Secondary | ICD-10-CM | POA: Diagnosis not present

## 2019-03-02 DIAGNOSIS — I10 Essential (primary) hypertension: Secondary | ICD-10-CM | POA: Diagnosis not present

## 2019-03-10 DIAGNOSIS — M1711 Unilateral primary osteoarthritis, right knee: Secondary | ICD-10-CM | POA: Diagnosis not present

## 2019-03-11 DIAGNOSIS — Z713 Dietary counseling and surveillance: Secondary | ICD-10-CM | POA: Diagnosis not present

## 2019-03-11 DIAGNOSIS — Z299 Encounter for prophylactic measures, unspecified: Secondary | ICD-10-CM | POA: Diagnosis not present

## 2019-03-11 DIAGNOSIS — Z6829 Body mass index (BMI) 29.0-29.9, adult: Secondary | ICD-10-CM | POA: Diagnosis not present

## 2019-03-11 DIAGNOSIS — G4700 Insomnia, unspecified: Secondary | ICD-10-CM | POA: Diagnosis not present

## 2019-03-11 DIAGNOSIS — M1711 Unilateral primary osteoarthritis, right knee: Secondary | ICD-10-CM | POA: Diagnosis not present

## 2019-03-23 DIAGNOSIS — J3489 Other specified disorders of nose and nasal sinuses: Secondary | ICD-10-CM | POA: Diagnosis not present

## 2019-03-23 DIAGNOSIS — I10 Essential (primary) hypertension: Secondary | ICD-10-CM | POA: Diagnosis not present

## 2019-03-23 DIAGNOSIS — Z299 Encounter for prophylactic measures, unspecified: Secondary | ICD-10-CM | POA: Diagnosis not present

## 2019-03-23 DIAGNOSIS — E1165 Type 2 diabetes mellitus with hyperglycemia: Secondary | ICD-10-CM | POA: Diagnosis not present

## 2019-03-23 DIAGNOSIS — E1142 Type 2 diabetes mellitus with diabetic polyneuropathy: Secondary | ICD-10-CM | POA: Diagnosis not present

## 2019-04-03 ENCOUNTER — Other Ambulatory Visit: Payer: Self-pay | Admitting: Cardiology

## 2019-04-05 DIAGNOSIS — I10 Essential (primary) hypertension: Secondary | ICD-10-CM | POA: Diagnosis not present

## 2019-04-07 DIAGNOSIS — Z6829 Body mass index (BMI) 29.0-29.9, adult: Secondary | ICD-10-CM | POA: Diagnosis not present

## 2019-04-07 DIAGNOSIS — R270 Ataxia, unspecified: Secondary | ICD-10-CM | POA: Diagnosis not present

## 2019-04-07 DIAGNOSIS — Z299 Encounter for prophylactic measures, unspecified: Secondary | ICD-10-CM | POA: Diagnosis not present

## 2019-04-07 DIAGNOSIS — Z713 Dietary counseling and surveillance: Secondary | ICD-10-CM | POA: Diagnosis not present

## 2019-04-07 DIAGNOSIS — I10 Essential (primary) hypertension: Secondary | ICD-10-CM | POA: Diagnosis not present

## 2019-04-11 DIAGNOSIS — J324 Chronic pansinusitis: Secondary | ICD-10-CM | POA: Diagnosis not present

## 2019-04-11 DIAGNOSIS — H903 Sensorineural hearing loss, bilateral: Secondary | ICD-10-CM | POA: Diagnosis not present

## 2019-04-15 DIAGNOSIS — E1165 Type 2 diabetes mellitus with hyperglycemia: Secondary | ICD-10-CM | POA: Diagnosis not present

## 2019-04-15 DIAGNOSIS — Z299 Encounter for prophylactic measures, unspecified: Secondary | ICD-10-CM | POA: Diagnosis not present

## 2019-04-15 DIAGNOSIS — I10 Essential (primary) hypertension: Secondary | ICD-10-CM | POA: Diagnosis not present

## 2019-05-02 DIAGNOSIS — M7542 Impingement syndrome of left shoulder: Secondary | ICD-10-CM | POA: Diagnosis not present

## 2019-05-04 DIAGNOSIS — H905 Unspecified sensorineural hearing loss: Secondary | ICD-10-CM | POA: Diagnosis not present

## 2019-05-04 DIAGNOSIS — D509 Iron deficiency anemia, unspecified: Secondary | ICD-10-CM | POA: Diagnosis not present

## 2019-05-04 DIAGNOSIS — Z299 Encounter for prophylactic measures, unspecified: Secondary | ICD-10-CM | POA: Diagnosis not present

## 2019-05-04 DIAGNOSIS — I10 Essential (primary) hypertension: Secondary | ICD-10-CM | POA: Diagnosis not present

## 2019-05-06 DIAGNOSIS — J324 Chronic pansinusitis: Secondary | ICD-10-CM | POA: Diagnosis not present

## 2019-05-06 DIAGNOSIS — I10 Essential (primary) hypertension: Secondary | ICD-10-CM | POA: Diagnosis not present

## 2019-05-06 DIAGNOSIS — J329 Chronic sinusitis, unspecified: Secondary | ICD-10-CM | POA: Diagnosis not present

## 2019-05-06 DIAGNOSIS — J3489 Other specified disorders of nose and nasal sinuses: Secondary | ICD-10-CM | POA: Diagnosis not present

## 2019-05-09 DIAGNOSIS — Z6829 Body mass index (BMI) 29.0-29.9, adult: Secondary | ICD-10-CM | POA: Diagnosis not present

## 2019-05-09 DIAGNOSIS — I4891 Unspecified atrial fibrillation: Secondary | ICD-10-CM | POA: Diagnosis not present

## 2019-05-09 DIAGNOSIS — E1142 Type 2 diabetes mellitus with diabetic polyneuropathy: Secondary | ICD-10-CM | POA: Diagnosis not present

## 2019-05-09 DIAGNOSIS — I10 Essential (primary) hypertension: Secondary | ICD-10-CM | POA: Diagnosis not present

## 2019-05-09 DIAGNOSIS — E1165 Type 2 diabetes mellitus with hyperglycemia: Secondary | ICD-10-CM | POA: Diagnosis not present

## 2019-05-09 DIAGNOSIS — Z299 Encounter for prophylactic measures, unspecified: Secondary | ICD-10-CM | POA: Diagnosis not present

## 2019-05-12 DIAGNOSIS — M79675 Pain in left toe(s): Secondary | ICD-10-CM | POA: Diagnosis not present

## 2019-05-12 DIAGNOSIS — L03032 Cellulitis of left toe: Secondary | ICD-10-CM | POA: Diagnosis not present

## 2019-05-16 ENCOUNTER — Telehealth: Payer: Self-pay | Admitting: Cardiology

## 2019-05-16 NOTE — Telephone Encounter (Signed)
Contacted patient to try to schedule [place]. Patient declined stated he did not need a cardiologist any longer

## 2019-05-17 DIAGNOSIS — H905 Unspecified sensorineural hearing loss: Secondary | ICD-10-CM | POA: Diagnosis not present

## 2019-05-18 NOTE — Progress Notes (Signed)
Primary Physician/Referring:  Glenda Chroman, MD  Patient ID: Brett Matthews, male    DOB: 11-30-1932, 86 y.o.   MRN: [id number]   Chief Complaint  Patient presents with  . Atrial Fibrillation  . Follow-up   HPI:    Brett Matthews  is a 86 y.o.  Caucasian male with paroxysmal atrial fibrillation, resistant hypertension, hyperlipidemia, controlled diabetes mellitus, moderate coronary artery disease in mid circumflex with 60% stenosis by angiography in 2013, paroxysmal atrial fibrillation, last presentation on 01/16/2019 with chest pain and dyspnea SP direct-current cardioversion on 01/18/2019 and presently on flecainide and Xarelto.  I had seen him 4 months ago following cardioversion, he had missed follow-up stating he did not need a cardiologist.  However he called our office and made an appointment stating that his blood pressure has been severely elevated and he is having difficulty in controlling the blood pressure.  No specific symptoms with elevated blood pressure, denies chest pain or palpitations.  Tolerating anticoagulation. Past Medical History:  Diagnosis Date  . Anemia, iron deficiency    Negative capsule endoscopy  . Arthritis   . Coronary atherosclerosis of native coronary artery    60 % circumflex stenosis; EF 65%, Cath 6/13  . Diverticula of colon    Pancolonic  . DM (diabetes mellitus), type 2, uncontrolled ([place])   . Dyspnea    Normal cardiopulmonary function test in July 2008, negative echocardiogram with "bubble" study for inter-cardiac shunt.  . Essential hypertension, benign   . Gastroesophageal reflux disease   . Hemorrhoids   . Hyperplastic colon polyp 04/06/2007  . Hypothyroidism 2003   Status post total thyroidectomy  . Migraines   . Neoplasm of lymphatic and hematopoietic tissue   . Paroxysmal atrial fibrillation ([place])    Diagnosed 2005  . Skin cancer    Past Surgical History:  Procedure Laterality Date  . BACK SURGERY    . BONE MARROW BIOPSY  1990's   . CARDIAC CATHETERIZATION    . CARDIOVERSION N/A 01/18/2019   Procedure: CARDIOVERSION;  Surgeon: Adrian Prows, MD;  Location: [facility];  Service: Cardiovascular;  Laterality: N/A;  . CATARACT EXTRACTION Bilateral   . COLONOSCOPY  04/06/2007   Dr. Gala Romney- Normal rectum with scattered pancolonic diverticula and slightly redundant elongated colon, diminutive polpy midsigmoid, remainder of colonic mucosa appeared normal. bx= hyperplastic polyp  . COLONOSCOPY N/A 11/29/2012   EUM:PNTIRWER preparation. Friable anal canal/internal hemorrhoids; otherwise, normal rectum. Normal-appearing colonic mucosa  . COLONOSCOPY WITH PROPOFOL N/A 03/12/2017   Procedure: COLONOSCOPY WITH PROPOFOL;  Surgeon: Daneil Dolin, MD;  Location: [facility];  Service: Endoscopy;  Laterality: N/A;  7:30am  . ESOPHAGOGASTRODUODENOSCOPY  04/06/2007   Dr. Gala Romney- normal esophagus s/p nissen fundoplication, intact nissen wrap o/w normal stomach  . ESOPHAGOGASTRODUODENOSCOPY N/A 11/29/2012   RMR: Abnormall distal esophagus bx c/w GERD. prior fundoplication. Gastric polyps  -bx benign. Status post biopsy of normal--appearing duodenal mucosa (bx neg)  . HEMORRHOID SURGERY N/A 04/06/2017   Procedure: EXTENSIVE HEMORRHOIDECTOMY;  Surgeon: Aviva Signs, MD;  Location: [facility];  Service: General;  Laterality: N/A;  . LEFT HEART CATHETERIZATION WITH CORONARY ANGIOGRAM N/A 06/27/2011   Procedure: LEFT HEART CATHETERIZATION WITH CORONARY ANGIOGRAM;  Surgeon: Thayer Headings, MD;  Location: [facility] CATH LAB;  Service: Cardiovascular;  Laterality: N/A;  . NISSEN FUNDOPLICATION    . POLYPECTOMY  03/12/2017   Procedure: POLYPECTOMY;  Surgeon: Daneil Dolin, MD;  Location: [facility];  Service: Endoscopy;;  cecal x3  .  POSTERIOR LAMINECTOMY / DECOMPRESSION LUMBAR SPINE  ~ 1990's  . SKIN CANCER EXCISION     "off my back and chest; from sun"   Family History  Problem Relation Age of Onset  . Cancer Other   . Stroke Other   . Hypertension  Mother   . Stroke Mother   . Cancer Father   . Cancer Sister   . Stroke Brother   . Colon cancer Neg Hx    Social History   Tobacco Use  . Smoking status: Never Smoker  . Smokeless tobacco: Never Used  Substance Use Topics  . Alcohol use: No    Marital Status: Divorced ROS  Review of Systems  Cardiovascular: Negative for chest pain, dyspnea on exertion and leg swelling.  Musculoskeletal: Positive for back pain and joint pain.  Gastrointestinal: Negative for melena.   Objective   Vitals with BMI 05/19/2019 05/19/2019 01/31/2019  Height - 5' 8" 5' 9"  Weight - 194 lbs 199 lbs 5 oz  BMI - 42.6 83.41  Systolic 962 229 798  Diastolic 95 91 73  Pulse 64 64 66    Physical Exam  Constitutional:  He is well-built and well-nourished and appears younger than stated age.  Cardiovascular: Normal rate, regular rhythm, normal heart sounds and intact distal pulses. Exam reveals no gallop.  No murmur heard. No leg edema, no JVD.  Pulmonary/Chest: Effort normal and breath sounds normal.  Abdominal: Soft. Bowel sounds are normal.   Laboratory examination:   Recent Labs    01/16/19 1345  NA 132*  K 4.2  CL 98  CO2 25  GLUCOSE 212*  BUN 27*  CREATININE 1.14  CALCIUM 9.4  GFRNONAA 59*  GFRAA >60   CrCl cannot be calculated (Patient's most recent lab result is older than the maximum 21 days allowed.).  CMP Latest Ref Rng & Units 01/16/2019 04/08/2017 04/02/2017  Glucose 70 - 99 mg/dL 212(H) 91 98  BUN 8 - 23 mg/dL 27(H) 8 12  Creatinine 0.61 - 1.24 mg/dL 1.14 0.80 0.89  Sodium 135 - 145 mmol/L 132(L) 138 137  Potassium 3.5 - 5.1 mmol/L 4.2 3.8 3.9  Chloride 98 - 111 mmol/L 98 105 103  CO2 22 - 32 mmol/L _0 Calcium 8.9 - 10.3 mg/dL 9.4 9.8 9.6  Total Protein 6.5 - 8.1 g/dL 6.3(L) 7.3 -  Total Bilirubin 0.3 - 1.2 mg/dL 0.7 0.5 -  Alkaline Phos 38 - 126 U/L 83 86 -  AST 15 - 41 U/L 16 17 -  ALT 0 - 44 U/L 18 17 -   CBC Latest Ref Rng & Units 01/16/2019 04/08/2017  04/02/2017  WBC 4.0 - 10.5 K/uL 8.7 7.0 6.2  Hemoglobin 13.0 - 17.0 g/dL 13.6 12.0(L) 12.0(L)  Hematocrit 39.0 - 52.0 % 42.5 37.5(L) 37.7(L)  Platelets 150 - 400 K/uL 273 284 331   HEMOGLOBIN A1C Lab Results  Component Value Date   HGBA1C 6.4 (H) 04/02/2017   MPG 136.98 04/02/2017   External Labs:  02/15/2019: Glucose 217. Albumin 4.07, Potassium 4.4, Glucose 238 (H), Total Bilirubin 0.3, Protein, Total 6.2, eGFR >6.,  AST 14.4, ALT 16 , Chloride 97 (L), Creatinine 0.79, BUN 14, Alkaline Phosphatase 89.  11/08/2018: Serum glucose 149 mg, BUN 16, creatinine 0.89, eGFR >60 mL.  CBC normal.  11/05/2018: TSH 2.41 normal  12/08/2018: Total cholesterol 173, triglycerides 136, HDL 41, LDL 118. Serum glucose 167 mg, BUN 11, creatinine 0.99, eGFR >70 mL, potassium 3.6.  CMP otherwise normal.  Alkaline phosphatase minimally elevated at 01/07/20.  HB 12.8/HCT 39.9, platelets 297.  TSH normal. PSA mildly elevated.   Medications and allergies  No Known Allergies   Current Outpatient Medications  Medication Instructions  . amLODipine (NORVASC) 10 mg, Oral, Daily  . atorvastatin (LIPITOR) 10 MG tablet TAKE 1 TABLET(10 MG) BY MOUTH DAILY  . benazepril (LOTENSIN) 20 mg, Oral, Daily  . carvedilol (COREG) 12.5 mg, Oral, 2 times daily with meals, Take 1 and 1/2 tablets BID   . chlorthalidone (HYGROTON) 25 mg, Oral, Daily  . citalopram (CELEXA) 20 MG tablet 1 tablet, Oral, Daily  . clonazePAM (KLONOPIN) 1 mg, Oral, 2 times daily  . cloNIDine (CATAPRES) 0.1 mg, Oral, When top blood pressure >170  . diltiazem (CARDIZEM CD) 240 mg, Oral, Daily  . docusate sodium (COLACE) 100 mg, Oral, Daily PRN  . famotidine (PEPCID) 40 mg, Oral, Daily  . flecainide (TAMBOCOR) 50 MG tablet TAKE 1 TABLET(50 MG) BY MOUTH TWICE DAILY  . fluticasone (FLONASE) 50 MCG/ACT nasal spray 1 spray, Each Nare, 2 times daily  . folic acid (FOLVITE) 1 MG tablet 1 tablet, Oral, Daily  . gabapentin (NEURONTIN) 300 MG capsule 2  capsules, Oral, 2 times daily  . glimepiride (AMARYL) 2 mg, Oral, 2 times daily  . hydrALAZINE (APRESOLINE) 25 mg, Oral, 3 times daily  . isosorbide dinitrate (ISORDIL) 30 mg, Oral, 3 times daily  . levothyroxine (SYNTHROID) 125 mcg, Oral, Daily before breakfast, SYNTHROID - brand name only   . LINZESS 290 MCG CAPS capsule 1 capsule, Oral, Daily  . metFORMIN (GLUCOPHAGE) 500 MG tablet Take 2 tabs (1,041m) by mouth every morning & 1 tab (5039m every evening  . oxyCODONE-acetaminophen (PERCOCET/ROXICET) 5-325 MG tablet 1 tablet, Oral, Every 6 hours PRN  . pantoprazole (PROTONIX) 40 mg, Daily  . rivaroxaban (XARELTO) 20 mg, Oral, Daily with supper  . spironolactone (ALDACTONE) 25 mg, Oral, Daily  . tamsulosin (FLOMAX) 0.4 mg, Oral, Daily after supper   Radiology:   Chest x-ray PA and lateral view 11/08/2018: Normal chest x-ray with no active lung disease.  Cardiac Studies:   Coronary  angiogram 06/27/2011: Mild left main disease, mid circumflex 60% stenosis.  Mild luminal irregularity of the RCA and normal LAD.  LVEF 65%.  Lexiscan Tetrofosmin Stress Test 12/27/2018: Nondiagnostic ECG stress. Normal myocardial perfusion. Stress LV EF: 58%.  No previous exam available for comparison. Low risk study.   Echocardiogram 12/09/2018: Left ventricle cavity is normal in size. Moderate concentric hypertrophy of the left ventricle. Normal LV systolic function with EF 65%. Normal global wall motion. Doppler evidence of grade I (impaired) diastolic dysfunction, normal LAP. Mild (Grade I) mitral regurgitation. Mild tricuspid regurgitation. Estimated pulmonary artery systolic pressure is 23 mmHg.  EKG   01/31/2019: Sinus bradycardia at rate of 54 bpm, left axis deviation, left anterior block.  Poor R progression, cannot exclude anterolateral infarct old.  Normal QT interval.  No evidence of ischemia. No significant change from  01/10/2019.  01/17/2019: Atypical atrial flutter with variable  ventricular response at the rate of 110 bpm, left axis deviation, left anterior fascicular block.  IVCD.  Poor R-wave progression, cannot exclude anterolateral infarct old.  No evidence of ischemia.  Low-voltage limb leads, consider COPD.  Assessment     ICD-10-CM   1. Essential hypertension, benign  I10 isosorbide dinitrate (ISORDIL) 30 MG tablet    hydrALAZINE (APRESOLINE) 25 MG tablet  2. Paroxysmal atrial fibrillation (HCC) CHA2DS2-VASc Score is 5.  Yearly risk of stroke: 6.2%(A, Vasc  Dz - CAD 60% Cx, HTN, DM)  I48.0      Meds ordered this encounter  Medications  . isosorbide dinitrate (ISORDIL) 30 MG tablet    Sig: Take 1 tablet (30 mg total) by mouth 3 (three) times daily.    Dispense:  90 tablet    Refill:  2  . hydrALAZINE (APRESOLINE) 25 MG tablet    Sig: Take 1 tablet (25 mg total) by mouth 3 (three) times daily.    Dispense:  90 tablet    Refill:  2   Medications Discontinued During This Encounter  Medication Reason  . carvedilol (COREG) 25 MG tablet Error  . clonazePAM (KLONOPIN) 0.5 MG tablet Patient has not taken in last 30 days   Recommendations:   Brett Matthews  is a 86 y.o. Caucasian male with paroxysmal atrial fibrillation, resistant hypertension, hyperlipidemia, controlled diabetes mellitus, moderate coronary artery disease in mid circumflex with 60% stenosis by angiography in 2013, paroxysmal atrial fibrillation, last presentation on 01/16/2019 with chest pain and dyspnea SP direct-current cardioversion on 01/18/2019 and presently on flecainide and Xarelto.  He made this appointment to follow-up on uncontrolled hypertension.  Patient presently on 4 -ve chronotropic agents including Coreg, diltiazem CD, flecainide and clonidine.  Although advanced age, appears healthy and asymptomatic and tolerating this but needs to be followed closely for any development of heart block.  No dizziness or syncope.  Previously with addition of Aldactone, blood pressure was well  controlled.  I have added isosorbide dinitrate 30 mg and hydralazine 25 mg p.o. 3 times daily and would like to see him back in 4 to 6 weeks for follow-up.  Adrian Prows, MD, [facility] 05/20/2019, 5:11 AM [facility] Pager: [phone] Office: [phone]

## 2019-05-19 ENCOUNTER — Other Ambulatory Visit: Payer: Self-pay

## 2019-05-19 ENCOUNTER — Encounter: Payer: Self-pay | Admitting: Cardiology

## 2019-05-19 ENCOUNTER — Ambulatory Visit: Payer: PPO | Admitting: Cardiology

## 2019-05-19 VITALS — BP 174/95 | HR 64 | Temp 96.8°F | Resp 16 | Ht 68.0 in | Wt 194.0 lb

## 2019-05-19 DIAGNOSIS — I10 Essential (primary) hypertension: Secondary | ICD-10-CM

## 2019-05-19 DIAGNOSIS — I480 Paroxysmal atrial fibrillation: Secondary | ICD-10-CM | POA: Diagnosis not present

## 2019-05-19 MED ORDER — HYDRALAZINE HCL 25 MG PO TABS: 25.0000 mg | ORAL_TABLET | Freq: Three times a day (TID) | ORAL | 2 refills | Status: DC

## 2019-05-19 MED ORDER — ISOSORBIDE DINITRATE 30 MG PO TABS: 30.0000 mg | ORAL_TABLET | Freq: Three times a day (TID) | ORAL | 2 refills | Status: DC

## 2019-05-24 DIAGNOSIS — M170 Bilateral primary osteoarthritis of knee: Secondary | ICD-10-CM | POA: Diagnosis not present

## 2019-05-24 DIAGNOSIS — M2351 Chronic instability of knee, right knee: Secondary | ICD-10-CM | POA: Diagnosis not present

## 2019-05-24 DIAGNOSIS — M2352 Chronic instability of knee, left knee: Secondary | ICD-10-CM | POA: Diagnosis not present

## 2019-05-31 DIAGNOSIS — L03032 Cellulitis of left toe: Secondary | ICD-10-CM | POA: Diagnosis not present

## 2019-05-31 DIAGNOSIS — M79675 Pain in left toe(s): Secondary | ICD-10-CM | POA: Diagnosis not present

## 2019-06-01 DIAGNOSIS — G8929 Other chronic pain: Secondary | ICD-10-CM | POA: Diagnosis not present

## 2019-06-01 DIAGNOSIS — Z87828 Personal history of other (healed) physical injury and trauma: Secondary | ICD-10-CM | POA: Diagnosis not present

## 2019-06-01 DIAGNOSIS — M25512 Pain in left shoulder: Secondary | ICD-10-CM | POA: Diagnosis not present

## 2019-06-08 DIAGNOSIS — G4700 Insomnia, unspecified: Secondary | ICD-10-CM | POA: Diagnosis not present

## 2019-06-08 DIAGNOSIS — Z299 Encounter for prophylactic measures, unspecified: Secondary | ICD-10-CM | POA: Diagnosis not present

## 2019-06-08 DIAGNOSIS — I4891 Unspecified atrial fibrillation: Secondary | ICD-10-CM | POA: Diagnosis not present

## 2019-06-08 DIAGNOSIS — I10 Essential (primary) hypertension: Secondary | ICD-10-CM | POA: Diagnosis not present

## 2019-06-10 DIAGNOSIS — M25561 Pain in right knee: Secondary | ICD-10-CM | POA: Diagnosis not present

## 2019-06-10 DIAGNOSIS — Z299 Encounter for prophylactic measures, unspecified: Secondary | ICD-10-CM | POA: Diagnosis not present

## 2019-06-10 DIAGNOSIS — M25562 Pain in left knee: Secondary | ICD-10-CM | POA: Diagnosis not present

## 2019-06-10 DIAGNOSIS — I10 Essential (primary) hypertension: Secondary | ICD-10-CM | POA: Diagnosis not present

## 2019-06-10 DIAGNOSIS — R262 Difficulty in walking, not elsewhere classified: Secondary | ICD-10-CM | POA: Diagnosis not present

## 2019-06-10 DIAGNOSIS — M170 Bilateral primary osteoarthritis of knee: Secondary | ICD-10-CM | POA: Diagnosis not present

## 2019-06-23 ENCOUNTER — Other Ambulatory Visit: Payer: Self-pay

## 2019-07-02 ENCOUNTER — Other Ambulatory Visit: Payer: Self-pay | Admitting: Cardiology

## 2019-07-05 ENCOUNTER — Ambulatory Visit: Payer: PPO | Admitting: Cardiology

## 2019-07-05 ENCOUNTER — Encounter: Payer: Self-pay | Admitting: Cardiology

## 2019-07-05 ENCOUNTER — Other Ambulatory Visit: Payer: Self-pay

## 2019-07-05 VITALS — BP 152/87 | HR 61 | Resp 16 | Ht 68.0 in | Wt 191.0 lb

## 2019-07-05 DIAGNOSIS — I2510 Atherosclerotic heart disease of native coronary artery without angina pectoris: Secondary | ICD-10-CM | POA: Diagnosis not present

## 2019-07-05 DIAGNOSIS — I480 Paroxysmal atrial fibrillation: Secondary | ICD-10-CM | POA: Diagnosis not present

## 2019-07-05 DIAGNOSIS — E119 Type 2 diabetes mellitus without complications: Secondary | ICD-10-CM | POA: Diagnosis not present

## 2019-07-05 DIAGNOSIS — E782 Mixed hyperlipidemia: Secondary | ICD-10-CM | POA: Diagnosis not present

## 2019-07-05 DIAGNOSIS — I10 Essential (primary) hypertension: Secondary | ICD-10-CM | POA: Diagnosis not present

## 2019-07-05 DIAGNOSIS — Z7901 Long term (current) use of anticoagulants: Secondary | ICD-10-CM

## 2019-07-05 NOTE — Progress Notes (Signed)
EKG 07/05/2019: Sinus bradycardia at the rate of 59 bpm, left atrial enlargement, left axis deviation, left anterior fascicular block.  Poor R progression, cannot exclude anteroseptal infarct old.  IVCD, borderline criteria for LVH.

## 2019-07-05 NOTE — Progress Notes (Signed)
Primary Physician/Referring:  Glenda Chroman, MD  Patient ID: Brett Matthews, male    DOB: [birth year], 86 y.o.   MRN: [id number]   Chief Complaint  Patient presents with  . Hypertension  . Atrial Fibrillation  . Follow-up   HPI:    Brett Matthews  is a 86 y.o.  Caucasian male with paroxysmal atrial fibrillation, resistant hypertension, hyperlipidemia, controlled diabetes mellitus, moderate coronary artery disease in mid circumflex with 60% stenosis by angiography in 2013, paroxysmal atrial fibrillation, last presentation on 01/16/2019 with chest pain and dyspnea SP direct-current cardioversion on 01/18/2019 and presently on flecainide and Xarelto.  Now presents to the office for follow-up in regards to blood pressure management.  At last office visit he noted that his blood pressures were significantly elevated and therefore requested medication titrations.  At the last office visit patient was started on hydralazine and Isordil combination.  Patient states that since then the addition of new medications his blood pressure has improved significantly.  His systolic blood pressures are ranging between 150-168 mmHg.  He does not have a blood pressure log for me to review at today's office visit.  Unfortunately, medication reconciliation is difficult to perform at today's office visit as he does not know all the medications and he is on and brings in 3 different lists.  Patient carries a prior diagnosis of paroxysmal atrial fibrillation for which he is on rate control medication and oral anticoagulation for thromboembolic prophylaxis.  Patient does not endorse any evidence of bleeding.  Past Medical History:  Diagnosis Date  . Anemia, iron deficiency    Negative capsule endoscopy  . Arthritis   . Coronary atherosclerosis of native coronary artery    60 % circumflex stenosis; EF 65%, Cath 6/13  . Diverticula of colon    Pancolonic  . DM (diabetes mellitus), type 2, uncontrolled ([place])   .  Dyspnea    Normal cardiopulmonary function test in July 2008, negative echocardiogram with "bubble" study for inter-cardiac shunt.  . Essential hypertension, benign   . Gastroesophageal reflux disease   . Hemorrhoids   . Hyperplastic colon polyp 04/06/2007  . Hypothyroidism 2003   Status post total thyroidectomy  . Migraines   . Neoplasm of lymphatic and hematopoietic tissue   . Paroxysmal atrial fibrillation ([place])    Diagnosed 2005  . Skin cancer    Past Surgical History:  Procedure Laterality Date  . BACK SURGERY    . BONE MARROW BIOPSY  1990's  . CARDIAC CATHETERIZATION    . CARDIOVERSION N/A 01/18/2019   Procedure: CARDIOVERSION;  Surgeon: Adrian Prows, MD;  Location: [facility] ENDOSCOPY;  Service: Cardiovascular;  Laterality: N/A;  . CATARACT EXTRACTION Bilateral   . COLONOSCOPY  04/06/2007   Dr. Gala Romney- Normal rectum with scattered pancolonic diverticula and slightly redundant elongated colon, diminutive polpy midsigmoid, remainder of colonic mucosa appeared normal. bx= hyperplastic polyp  . COLONOSCOPY N/A 11/29/2012   IWO:[id number] preparation. Friable anal canal/internal hemorrhoids; otherwise, normal rectum. Normal-appearing colonic mucosa  . COLONOSCOPY WITH PROPOFOL N/A 03/12/2017   Procedure: COLONOSCOPY WITH PROPOFOL;  Surgeon: Daneil Dolin, MD;  Location[facility] ENDO [facility];  Service: Endoscopy;  Laterality: N/A;  7:30am  . ESOPHAGOGASTRODUODENOSCOPY  04/06/2007   Dr. Gala Romney- normal esophagus s/p nissen fundoplication, intact nissen wrap o/w normal stomach  . ESOPHAGOGASTRODUODENOSCOPY N/A 11/29/2012   RMR: Abnormall distal esophagus bx c/w GERD. prior fundoplication. Gastric polyps  -bx benign. Status post biopsy of normal--appearing duodenal mucosa (bx neg)  . HEMORRHOID SURGERY  N/A 04/06/2017   Procedure: EXTENSIVE HEMORRHOIDECTOMY;  Surgeon: Aviva Signs, MD;  Location: [facility] ORS;  Service: General;  Laterality: N/A;  . LEFT HEART CATHETERIZATION WITH CORONARY ANGIOGRAM N/A 06/27/2011    Procedure: LEFT HEART CATHETERIZATION WITH CORONARY ANGIOGRAM;  Surgeon: Thayer Headings, MD;  Location: [facility] CATH LAB;  Service: Cardiovascular;  Laterality: N/A;  . NISSEN FUNDOPLICATION    . POLYPECTOMY  03/12/2017   Procedure: POLYPECTOMY;  Surgeon: Daneil Dolin, MD;  Location: [facility] ENDO [facility];  Service: Endoscopy;;  cecal x3  . POSTERIOR LAMINECTOMY / DECOMPRESSION LUMBAR SPINE  ~ 1990's  . SKIN CANCER EXCISION     "off my back and chest; from sun"   Family History  Problem Relation Age of Onset  . Cancer Other   . Stroke Other   . Hypertension Mother   . Stroke Mother   . Cancer Father   . Cancer Sister   . Stroke Brother   . Colon cancer Neg Hx    Social History   Tobacco Use  . Smoking status: Never Smoker  . Smokeless tobacco: Never Used  Substance Use Topics  . Alcohol use: No    Marital Status: Divorced ROS  Review of Systems  Cardiovascular: Negative for chest pain, dyspnea on exertion and leg swelling.  Musculoskeletal: Positive for back pain and joint pain.  Gastrointestinal: Negative for melena.   Objective   Vitals with BMI 07/05/2019 07/05/2019 07/05/2019  Height _0  - _1   Weight 191 lbs - 191 lbs  BMI 28.00 - 34.91  Systolic 791 505 697  Diastolic 87 87 95  Pulse 61 61 61    Physical Exam Constitutional:      Comments: He is well-built and well-nourished and appears younger than stated age.  Cardiovascular:     Rate and Rhythm: Normal rate and regular rhythm.     Pulses: Intact distal pulses.     Heart sounds: Normal heart sounds. No murmur heard.  No gallop.      Comments: No leg edema, no JVD. Pulmonary:     Effort: Pulmonary effort is normal.     Breath sounds: Normal breath sounds.  Abdominal:     General: Bowel sounds are normal.     Palpations: Abdomen is soft.    Laboratory examination:   Recent Labs    01/16/19 1345  NA 132*  K 4.2  CL 98  CO2 25  GLUCOSE 212*  BUN 27*  CREATININE 1.14  CALCIUM 9.4  GFRNONAA 59*   GFRAA >60   CrCl cannot be calculated (Patient's most recent lab result is older than the maximum 21 days allowed.).  CMP Latest Ref Rng & Units 01/16/2019 04/08/2017 04/02/2017  Glucose 70 - 99 mg/dL 212(H) 91 98  BUN 8 - 23 mg/dL 27(H) 8 12  Creatinine 0.61 - 1.24 mg/dL 1.14 0.80 0.89  Sodium 135 - 145 mmol/L 132(L) 138 137  Potassium 3.5 - 5.1 mmol/L 4.2 3.8 3.9  Chloride 98 - 111 mmol/L 98 105 103  CO2 22 - 32 mmol/L _2 Calcium 8.9 - 10.3 mg/dL 9.4 9.8 9.6  Total Protein 6.5 - 8.1 g/dL 6.3(L) 7.3 -  Total Bilirubin 0.3 - 1.2 mg/dL 0.7 0.5 -  Alkaline Phos 38 - 126 U/L 83 86 -  AST 15 - 41 U/L 16 17 -  ALT 0 - 44 U/L 18 17 -   CBC Latest Ref Rng & Units 01/16/2019 04/08/2017 04/02/2017  WBC 4.0 - 10.5  K/uL 8.7 7.0 6.2  Hemoglobin 13.0 - 17.0 g/dL 13.6 12.0(L) 12.0(L)  Hematocrit 39 - 52 % 42.5 37.5(L) 37.7(L)  Platelets 150 - 400 K/uL 273 284 331   HEMOGLOBIN A1C Lab Results  Component Value Date   HGBA1C 6.4 (H) 04/02/2017   MPG 136.98 04/02/2017   External Labs:  02/15/2019: Glucose 217. Albumin 4.07, Potassium 4.4, Glucose 238 (H), Total Bilirubin 0.3, Protein, Total 6.2, eGFR >6.,  AST 14.4, ALT 16 , Chloride 97 (L), Creatinine 0.79, BUN 14, Alkaline Phosphatase 89.  11/08/2018: Serum glucose 149 mg, BUN 16, creatinine 0.89, eGFR >60 mL.  CBC normal.  11/05/2018: TSH 2.41 normal  12/08/2018: Total cholesterol 173, triglycerides 136, HDL 41, LDL 118. Serum glucose 167 mg, BUN 11, creatinine 0.99, eGFR >70 mL, potassium 3.6.  CMP otherwise normal.  Alkaline phosphatase minimally elevated at 01/07/20.  HB 12.8/HCT 39.9, platelets 297.  TSH normal. PSA mildly elevated.   Medications and allergies  No Known Allergies   Current Outpatient Medications  Medication Instructions  . amLODipine (NORVASC) 10 mg, Oral, Patient received medication from Dr. Woody Seller on 07/02/2019. Patient states he hasn't started the medication yet. Patient is unsure when to start medication.  Marland Kitchen  atorvastatin (LIPITOR) 10 MG tablet TAKE 1 TABLET(10 MG) BY MOUTH DAILY  . benazepril (LOTENSIN) 20 mg, Oral, Daily  . carvedilol (COREG) 12.5 mg, Oral, 2 times daily with meals, Take 1 and 1/2 tablets BID   . citalopram (CELEXA) 20 MG tablet 1 tablet, Oral, Daily  . clonazePAM (KLONOPIN) 1 mg, Oral, 2 times daily  . cloNIDine (CATAPRES) 0.1 mg, Oral, When top blood pressure >170  . diltiazem (CARDIZEM CD) 240 mg, Oral, Daily  . docusate sodium (COLACE) 100 mg, Oral, Daily PRN  . famotidine (PEPCID) 40 mg, Oral, Daily  . fluticasone (FLONASE) 50 MCG/ACT nasal spray 1 spray, Each Nare, 2 times daily  . folic acid (FOLVITE) 1 MG tablet 1 tablet, Oral, Daily  . gabapentin (NEURONTIN) 300 MG capsule 2 capsules, Oral, 2 times daily  . glimepiride (AMARYL) 2 mg, Oral, 2 times daily  . hydrALAZINE (APRESOLINE) 25 mg, Oral, 3 times daily  . levothyroxine (SYNTHROID) 125 mcg, Oral, Daily before breakfast, SYNTHROID - brand name only   . LINZESS 290 MCG CAPS capsule 1 capsule, Oral, Daily  . metFORMIN (GLUCOPHAGE) 500 MG tablet Take 2 tabs (1,051m) by mouth every morning & 1 tab (5079m every evening  . pantoprazole (PROTONIX) 40 mg, Daily  . rivaroxaban (XARELTO) 20 mg, Oral, Daily with supper  . tamsulosin (FLOMAX) 0.4 mg, Oral, Daily after supper   Radiology:   Chest x-ray PA and lateral view 11/08/2018: Normal chest x-ray with no active lung disease.  Cardiac Studies:   Coronary  angiogram 06/27/2011: Mild left main disease, mid circumflex 60% stenosis.  Mild luminal irregularity of the RCA and normal LAD.  LVEF 65%.  Lexiscan Tetrofosmin Stress Test 12/27/2018: Nondiagnostic ECG stress. Normal myocardial perfusion. Stress LV EF: 58%.  No previous exam available for comparison. Low risk study.   Echocardiogram 12/09/2018: Moderate LVH, LVEF 6567%grade 1 diastolic impairment, mild MR, mild TR, RVSP 23 mmHg.    EKG   01/31/2019: Sinus bradycardia at rate of 54 bpm, left axis deviation,  left anterior block.  Poor R progression, cannot exclude anterolateral infarct old.  Normal QT interval.  No evidence of ischemia. No significant change from  01/10/2019.  01/17/2019: Atypical atrial flutter with variable ventricular response at the rate of 110 bpm, left axis  deviation, left anterior fascicular block.  IVCD.  Poor R-wave progression, cannot exclude anterolateral infarct old.  No evidence of ischemia.  Low-voltage limb leads, consider COPD.  Assessment     ICD-10-CM   1. Essential hypertension, benign  N40 Basic metabolic panel    Magnesium  2. Paroxysmal atrial fibrillation (HCC)  I48.0   3. Long term (current) use of anticoagulants  Z79.01   4. Coronary artery disease involving native coronary artery of native heart without angina pectoris  I25.10   5. Mixed hyperlipidemia  E78.2   6. Controlled type 2 diabetes mellitus without complication, without long-term current use of insulin (HCC)  E11.9      No orders of the defined types were placed in this encounter.  Recommendations:   VUK SKILLERN  is a 86 y.o. Caucasian male with paroxysmal atrial fibrillation, resistant hypertension, hyperlipidemia, controlled diabetes mellitus, moderate coronary artery disease in mid circumflex with 60% stenosis by angiography in 2013, paroxysmal atrial fibrillation, last presentation on 01/16/2019 with chest pain and dyspnea SP direct-current cardioversion on 01/18/2019.  Benign essential hypertension: Improving but currently not at goal.  Medication reconciliation difficult to perform as the patient does not recall all the medications that he currently takes on a daily basis and he provides 3 different lists of medications today.  He is encouraged to bring his medication bottles in at the next office visit so accurate reconciliation can be performed.  Start Norvasc 10 mg p.o. every evening.  Check BMP and a magnesium check prior to the next office visit.  Paroxysmal atrial  fibrillation:  Rate control: Carvedilol and diltiazem.  Rhythm control: N/A.  Thromboembolic prophylaxis: Eliquis  Continue current medical therapy.  Long-term anticoagulation:  Indication: Paroxysmal atrial fibrillation.  He does not endorse any evidence of bleeding.  Continue to monitor.  Non-insulin-dependent diabetes mellitus type 2: Currently managed by primary team.  Rex Kras, DO, [facility]  Pager: [phone] Office: [phone]

## 2019-07-05 NOTE — Progress Notes (Signed)
Primary Physician/Referring:  Glenda Chroman, MD  Patient ID: Brett Matthews, male    DOB: June 29, 1932, 86 y.o.   MRN: [id number]   Chief Complaint  Patient presents with   Hypertension   Atrial Fibrillation   Follow-up    6 WEEK   HPI:    Brett Matthews  is a 86 y.o.  Caucasian male with paroxysmal atrial fibrillation, resistant hypertension, hyperlipidemia, controlled diabetes mellitus, moderate coronary artery disease in mid circumflex with 60% stenosis by angiography in 2013, paroxysmal atrial fibrillation, last presentation on 01/16/2019 with chest pain and dyspnea SP direct-current cardioversion on 01/18/2019 and presently on flecainide and Xarelto.  I had seen him 4 months ago following cardioversion, he had missed follow-up stating he did not need a cardiologist.  However he called our office and made an appointment stating that his blood pressure has been severely elevated and he is having difficulty in controlling the blood pressure.  No specific symptoms with elevated blood pressure, denies chest pain or palpitations.  Tolerating anticoagulation. Past Medical History:  Diagnosis Date   Anemia, iron deficiency    Negative capsule endoscopy   Arthritis    Coronary atherosclerosis of native coronary artery    60 % circumflex stenosis; EF 65%, Cath 6/13   Diverticula of colon    Pancolonic   DM (diabetes mellitus), type 2, uncontrolled ([place])    Dyspnea    Normal cardiopulmonary function test in July 2008, negative echocardiogram with "bubble" study for inter-cardiac shunt.   Essential hypertension, benign    Gastroesophageal reflux disease    Hemorrhoids    Hyperplastic colon polyp 04/06/2007   Hypothyroidism 2003   Status post total thyroidectomy   Migraines    Neoplasm of lymphatic and hematopoietic tissue    Paroxysmal atrial fibrillation ([place])    Diagnosed 2005   Skin cancer    Past Surgical History:  Procedure Laterality Date   BACK SURGERY      BONE MARROW BIOPSY  1990's   CARDIAC CATHETERIZATION     CARDIOVERSION N/A 01/18/2019   Procedure: CARDIOVERSION;  Surgeon: Adrian Prows, MD;  Location: [place];  Service: Cardiovascular;  Laterality: N/A;   CATARACT EXTRACTION Bilateral    COLONOSCOPY  04/06/2007   Dr. Gala Romney- Normal rectum with scattered pancolonic diverticula and slightly redundant elongated colon, diminutive polpy midsigmoid, remainder of colonic mucosa appeared normal. bx= hyperplastic polyp   COLONOSCOPY N/A 11/29/2012   [id number] preparation. Friable anal canal/internal hemorrhoids; otherwise, normal rectum. Normal-appearing colonic mucosa   COLONOSCOPY WITH PROPOFOL N/A 03/12/2017   Procedure: COLONOSCOPY WITH PROPOFOL;  Surgeon: Daneil Dolin, MD;  Location[facility] ENDO [facility];  Service: Endoscopy;  Laterality: N/A;  7:30am   ESOPHAGOGASTRODUODENOSCOPY  04/06/2007   Dr. Gala Romney- normal esophagus s/p nissen fundoplication, intact nissen wrap o/w normal stomach   ESOPHAGOGASTRODUODENOSCOPY N/A 11/29/2012   RMR: Abnormall distal esophagus bx c/w GERD. prior fundoplication. Gastric polyps  -bx benign. Status post biopsy of normal--appearing duodenal mucosa (bx neg)   HEMORRHOID SURGERY N/A 04/06/2017   Procedure: EXTENSIVE HEMORRHOIDECTOMY;  Surgeon: Aviva Signs, MD;  Location: [facility] ORS;  Service: General;  Laterality: N/A;   LEFT HEART CATHETERIZATION WITH CORONARY ANGIOGRAM N/A 06/27/2011   Procedure: LEFT HEART CATHETERIZATION WITH CORONARY ANGIOGRAM;  Surgeon: Thayer Headings, MD;  Location: [facility] CATH LAB;  Service: Cardiovascular;  Laterality: N/A;   NISSEN FUNDOPLICATION     POLYPECTOMY  03/12/2017   Procedure: POLYPECTOMY;  Surgeon: Daneil Dolin, MD;  Location: [facility];  Service: Endoscopy;;  cecal x3   POSTERIOR LAMINECTOMY / DECOMPRESSION LUMBAR SPINE  ~ 1990's   SKIN CANCER EXCISION     "off my back and chest; from sun"   Family History  Problem Relation Age of Onset   Cancer Other     Stroke Other    Hypertension Mother    Stroke Mother    Cancer Father    Cancer Sister    Stroke Brother    Colon cancer Neg Hx    Social History   Tobacco Use   Smoking status: Never Smoker   Smokeless tobacco: Never Used  Substance Use Topics   Alcohol use: No    Marital Status: Divorced ROS  Review of Systems  Cardiovascular: Negative for chest pain, dyspnea on exertion and leg swelling.  Musculoskeletal: Positive for back pain and joint pain.  Gastrointestinal: Negative for melena.   Objective   Vitals with BMI 07/05/2019 07/05/2019 05/19/2019  Height - _0  -  Weight - 191 lbs -  BMI - 35.70 -  Systolic 177 939 030  Diastolic 87 95 95  Pulse 61 61 64    Physical Exam Constitutional:      Comments: He is well-built and well-nourished and appears younger than stated age.  Cardiovascular:     Rate and Rhythm: Normal rate and regular rhythm.     Pulses: Intact distal pulses.     Heart sounds: Normal heart sounds. No murmur heard.  No gallop.      Comments: No leg edema, no JVD. Pulmonary:     Effort: Pulmonary effort is normal.     Breath sounds: Normal breath sounds.  Abdominal:     General: Bowel sounds are normal.     Palpations: Abdomen is soft.    Laboratory examination:   Recent Labs    01/16/19 1345  NA 132*  K 4.2  CL 98  CO2 25  GLUCOSE 212*  BUN 27*  CREATININE 1.14  CALCIUM 9.4  GFRNONAA 59*  GFRAA >60   CrCl cannot be calculated (Patient's most recent lab result is older than the maximum 21 days allowed.).  CMP Latest Ref Rng & Units 01/16/2019 04/08/2017 04/02/2017  Glucose 70 - 99 mg/dL 212(H) 91 98  BUN 8 - 23 mg/dL 27(H) 8 12  Creatinine 0.61 - 1.24 mg/dL 1.14 0.80 0.89  Sodium 135 - 145 mmol/L 132(L) 138 137  Potassium 3.5 - 5.1 mmol/L 4.2 3.8 3.9  Chloride 98 - 111 mmol/L 98 105 103  CO2 22 - 32 mmol/L _1 Calcium 8.9 - 10.3 mg/dL 9.4 9.8 9.6  Total Protein 6.5 - 8.1 g/dL 6.3(L) 7.3 -  Total Bilirubin 0.3 - 1.2  mg/dL 0.7 0.5 -  Alkaline Phos 38 - 126 U/L 83 86 -  AST 15 - 41 U/L 16 17 -  ALT 0 - 44 U/L 18 17 -   CBC Latest Ref Rng & Units 01/16/2019 04/08/2017 04/02/2017  WBC 4.0 - 10.5 K/uL 8.7 7.0 6.2  Hemoglobin 13.0 - 17.0 g/dL 13.6 12.0(L) 12.0(L)  Hematocrit 39 - 52 % 42.5 37.5(L) 37.7(L)  Platelets 150 - 400 K/uL 273 284 331   HEMOGLOBIN A1C Lab Results  Component Value Date   HGBA1C 6.4 (H) 04/02/2017   MPG 136.98 04/02/2017   External Labs:  02/15/2019: Glucose 217. Albumin 4.07, Potassium 4.4, Glucose 238 (H), Total Bilirubin 0.3, Protein, Total 6.2, eGFR >6.,  AST 14.4, ALT 16 , Chloride 97 (L), Creatinine 0.79, BUN  14, Alkaline Phosphatase 89.  11/08/2018: Serum glucose 149 mg, BUN 16, creatinine 0.89, eGFR >60 mL.  CBC normal.  11/05/2018: TSH 2.41 normal  12/08/2018: Total cholesterol 173, triglycerides 136, HDL 41, LDL 118. Serum glucose 167 mg, BUN 11, creatinine 0.99, eGFR >70 mL, potassium 3.6.  CMP otherwise normal.  Alkaline phosphatase minimally elevated at 01/07/20.  HB 12.8/HCT 39.9, platelets 297.  TSH normal. PSA mildly elevated.   Medications and allergies  No Known Allergies   Current Outpatient Medications  Medication Instructions   amLODipine (NORVASC) 10 mg, Oral, Patient received medication from Dr. Woody Seller on 07/02/2019. Patient states he hasn't started the medication yet. Patient is unsure when to start medication.   atorvastatin (LIPITOR) 10 MG tablet TAKE 1 TABLET(10 MG) BY MOUTH DAILY   benazepril (LOTENSIN) 20 mg, Oral, Daily   carvedilol (COREG) 12.5 mg, Oral, 2 times daily with meals, Take 1 and 1/2 tablets BID    chlorthalidone (HYGROTON) 25 mg, Daily   citalopram (CELEXA) 20 MG tablet 1 tablet, Oral, Daily   clonazePAM (KLONOPIN) 1 mg, Oral, 2 times daily   cloNIDine (CATAPRES) 0.1 mg, Oral, When top blood pressure >170   diltiazem (CARDIZEM CD) 240 mg, Oral, Daily   docusate sodium (COLACE) 100 mg, Oral, Daily PRN   famotidine (PEPCID)  40 mg, Oral, Daily   flecainide (TAMBOCOR) 50 MG tablet TAKE 1 TABLET(50 MG) BY MOUTH TWICE DAILY   fluticasone (FLONASE) 50 MCG/ACT nasal spray 1 spray, Each Nare, 2 times daily   folic acid (FOLVITE) 1 MG tablet 1 tablet, Oral, Daily   gabapentin (NEURONTIN) 300 MG capsule 2 capsules, Oral, 2 times daily   glimepiride (AMARYL) 2 mg, Oral, 2 times daily   hydrALAZINE (APRESOLINE) 25 mg, Oral, 3 times daily   isosorbide dinitrate (ISORDIL) 30 mg, Oral, 3 times daily   levothyroxine (SYNTHROID) 125 mcg, Oral, Daily before breakfast, SYNTHROID - brand name only    LINZESS 290 MCG CAPS capsule 1 capsule, Oral, Daily   metFORMIN (GLUCOPHAGE) 500 MG tablet Take 2 tabs (1,066m) by mouth every morning & 1 tab (5089m every evening   oxyCODONE-acetaminophen (PERCOCET/ROXICET) 5-325 MG tablet 1 tablet, Every 6 hours PRN   pantoprazole (PROTONIX) 40 mg, Daily   rivaroxaban (XARELTO) 20 mg, Oral, Daily with supper   spironolactone (ALDACTONE) 25 mg, Oral, Daily   tamsulosin (FLOMAX) 0.4 mg, Oral, Daily after supper   Radiology:   Chest x-ray PA and lateral view 11/08/2018: Normal chest x-ray with no active lung disease.  Cardiac Studies:   Coronary  angiogram 06/27/2011: Mild left main disease, mid circumflex 60% stenosis.  Mild luminal irregularity of the RCA and normal LAD.  LVEF 65%.  Lexiscan Tetrofosmin Stress Test 12/27/2018: Nondiagnostic ECG stress. Normal myocardial perfusion. Stress LV EF: 58%.  No previous exam available for comparison. Low risk study.   Echocardiogram 12/09/2018: Left ventricle cavity is normal in size. Moderate concentric hypertrophy of the left ventricle. Normal LV systolic function with EF 65%. Normal global wall motion. Doppler evidence of grade I (impaired) diastolic dysfunction, normal LAP. Mild (Grade I) mitral regurgitation. Mild tricuspid regurgitation. Estimated pulmonary artery systolic pressure is 23 mmHg.  EKG   01/31/2019: Sinus  bradycardia at rate of 54 bpm, left axis deviation, left anterior block.  Poor R progression, cannot exclude anterolateral infarct old.  Normal QT interval.  No evidence of ischemia. No significant change from  01/10/2019.  01/17/2019: Atypical atrial flutter with variable ventricular response at the rate of 110 bpm, left  axis deviation, left anterior fascicular block.  IVCD.  Poor R-wave progression, cannot exclude anterolateral infarct old.  No evidence of ischemia.  Low-voltage limb leads, consider COPD.  Assessment     ICD-10-CM   1. Essential hypertension, benign  I10 EKG 12-Lead     No orders of the defined types were placed in this encounter.  There are no discontinued medications. Recommendations:   Brett Matthews  is a 86 y.o. Caucasian male with paroxysmal atrial fibrillation, resistant hypertension, hyperlipidemia, controlled diabetes mellitus, moderate coronary artery disease in mid circumflex with 60% stenosis by angiography in 2013, paroxysmal atrial fibrillation, last presentation on 01/16/2019 with chest pain and dyspnea SP direct-current cardioversion on 01/18/2019 and presently on flecainide and Xarelto.  He made this appointment to follow-up on uncontrolled hypertension.  Patient presently on 4 -ve chronotropic agents including Coreg, diltiazem CD, flecainide and clonidine.  Although advanced age, appears healthy and asymptomatic and tolerating this but needs to be followed closely for any development of heart block.  No dizziness or syncope.  Previously with addition of Aldactone, blood pressure was well controlled.  I have added isosorbide dinitrate 30 mg and hydralazine 25 mg p.o. 3 times daily and would like to see him back in 4 to 6 weeks for follow-up.  Adrian Prows, MD, [facility] 07/05/2019, 3:49 PM [facility] Pager: [phone] Office: [phone]

## 2019-07-06 ENCOUNTER — Ambulatory Visit: Payer: PPO | Admitting: Cardiology

## 2019-07-08 DIAGNOSIS — F322 Major depressive disorder, single episode, severe without psychotic features: Secondary | ICD-10-CM | POA: Diagnosis not present

## 2019-07-08 DIAGNOSIS — F1320 Sedative, hypnotic or anxiolytic dependence, uncomplicated: Secondary | ICD-10-CM | POA: Diagnosis not present

## 2019-07-08 DIAGNOSIS — Z299 Encounter for prophylactic measures, unspecified: Secondary | ICD-10-CM | POA: Diagnosis not present

## 2019-07-08 DIAGNOSIS — I10 Essential (primary) hypertension: Secondary | ICD-10-CM | POA: Diagnosis not present

## 2019-07-08 DIAGNOSIS — I4891 Unspecified atrial fibrillation: Secondary | ICD-10-CM | POA: Diagnosis not present

## 2019-07-08 DIAGNOSIS — L600 Ingrowing nail: Secondary | ICD-10-CM | POA: Diagnosis not present

## 2019-07-12 DIAGNOSIS — M79675 Pain in left toe(s): Secondary | ICD-10-CM | POA: Diagnosis not present

## 2019-07-12 DIAGNOSIS — L03032 Cellulitis of left toe: Secondary | ICD-10-CM | POA: Diagnosis not present

## 2019-07-13 DIAGNOSIS — I10 Essential (primary) hypertension: Secondary | ICD-10-CM | POA: Diagnosis not present

## 2019-07-18 DIAGNOSIS — I10 Essential (primary) hypertension: Secondary | ICD-10-CM | POA: Diagnosis not present

## 2019-07-18 DIAGNOSIS — Z299 Encounter for prophylactic measures, unspecified: Secondary | ICD-10-CM | POA: Diagnosis not present

## 2019-07-18 DIAGNOSIS — M545 Low back pain: Secondary | ICD-10-CM | POA: Diagnosis not present

## 2019-07-18 DIAGNOSIS — L309 Dermatitis, unspecified: Secondary | ICD-10-CM | POA: Diagnosis not present

## 2019-07-18 DIAGNOSIS — E1142 Type 2 diabetes mellitus with diabetic polyneuropathy: Secondary | ICD-10-CM | POA: Diagnosis not present

## 2019-07-18 DIAGNOSIS — E1165 Type 2 diabetes mellitus with hyperglycemia: Secondary | ICD-10-CM | POA: Diagnosis not present

## 2019-07-19 ENCOUNTER — Other Ambulatory Visit: Payer: Self-pay

## 2019-07-19 DIAGNOSIS — I10 Essential (primary) hypertension: Secondary | ICD-10-CM

## 2019-07-22 ENCOUNTER — Encounter: Payer: Self-pay | Admitting: Cardiology

## 2019-07-22 ENCOUNTER — Ambulatory Visit: Payer: PPO | Admitting: Cardiology

## 2019-07-22 ENCOUNTER — Other Ambulatory Visit: Payer: Self-pay

## 2019-07-22 VITALS — BP 119/68 | HR 54 | Resp 16 | Ht 68.0 in | Wt 197.0 lb

## 2019-07-22 DIAGNOSIS — I10 Essential (primary) hypertension: Secondary | ICD-10-CM

## 2019-07-22 DIAGNOSIS — I480 Paroxysmal atrial fibrillation: Secondary | ICD-10-CM

## 2019-07-22 NOTE — Progress Notes (Signed)
Primary Physician/Referring:  Glenda Chroman, MD  Patient ID: Brett Matthews, male    DOB: 1932/03/20, 86 y.o.   MRN: [id number]   Chief Complaint  Patient presents with  . Hypertension  . Follow-up    2 week   HPI:    Brett Matthews  is a 86 y.o.  Caucasian male with paroxysmal atrial fibrillation, resistant hypertension, hyperlipidemia, controlled diabetes mellitus, moderate coronary artery disease in mid circumflex with 60% stenosis by angiography in 2013, paroxysmal atrial fibrillation, last presentation on 01/16/2019 with chest pain and dyspnea SP direct-current cardioversion on 01/18/2019 and presently on flecainide and Xarelto.  He presents here for follow-up of hypertension and atrial fibrillation.  States that he is feeling the best he has in quite a while.  Denies chest pain or palpitations.  Tolerating anticoagulation.  Past Medical History:  Diagnosis Date  . Anemia, iron deficiency    Negative capsule endoscopy  . Arthritis   . Coronary atherosclerosis of native coronary artery    60 % circumflex stenosis; EF 65%, Cath 6/13  . Diverticula of colon    Pancolonic  . DM (diabetes mellitus), type 2, uncontrolled ([place])   . Dyspnea    Normal cardiopulmonary function test in July 2008, negative echocardiogram with "bubble" study for inter-cardiac shunt.  . Essential hypertension, benign   . Gastroesophageal reflux disease   . Hemorrhoids   . Hyperplastic colon polyp 04/06/2007  . Hypothyroidism 2003   Status post total thyroidectomy  . Migraines   . Neoplasm of lymphatic and hematopoietic tissue   . Paroxysmal atrial fibrillation ([place])    Diagnosed 2005  . Skin cancer    Past Surgical History:  Procedure Laterality Date  . BACK SURGERY    . BONE MARROW BIOPSY  1990's  . CARDIAC CATHETERIZATION    . CARDIOVERSION N/A 01/18/2019   Procedure: CARDIOVERSION;  Surgeon: Adrian Prows, MD;  Location: [facility] ENDOSCOPY;  Service: Cardiovascular;  Laterality: N/A;  . CATARACT  EXTRACTION Bilateral   . COLONOSCOPY  04/06/2007   Dr. Gala Romney- Normal rectum with scattered pancolonic diverticula and slightly redundant elongated colon, diminutive polpy midsigmoid, remainder of colonic mucosa appeared normal. bx= hyperplastic polyp  . COLONOSCOPY N/A 11/29/2012   [id number] preparation. Friable anal canal/internal hemorrhoids; otherwise, normal rectum. Normal-appearing colonic mucosa  . COLONOSCOPY WITH PROPOFOL N/A 03/12/2017   Procedure: COLONOSCOPY WITH PROPOFOL;  Surgeon: Daneil Dolin, MD;  Location: [facility] ENDO SUITE;  Service: Endoscopy;  Laterality: N/A;  7:30am  . ESOPHAGOGASTRODUODENOSCOPY  04/06/2007   Dr. Gala Romney- normal esophagus s/p nissen fundoplication, intact nissen wrap o/w normal stomach  . ESOPHAGOGASTRODUODENOSCOPY N/A 11/29/2012   RMR: Abnormall distal esophagus bx c/w GERD. prior fundoplication. Gastric polyps  -bx benign. Status post biopsy of normal--appearing duodenal mucosa (bx neg)  . HEMORRHOID SURGERY N/A 04/06/2017   Procedure: EXTENSIVE HEMORRHOIDECTOMY;  Surgeon: Aviva Signs, MD;  Location: [facility] ORS;  Service: General;  Laterality: N/A;  . LEFT HEART CATHETERIZATION WITH CORONARY ANGIOGRAM N/A 06/27/2011   Procedure: LEFT HEART CATHETERIZATION WITH CORONARY ANGIOGRAM;  Surgeon: Thayer Headings, MD;  Location: [facility] CATH LAB;  Service: Cardiovascular;  Laterality: N/A;  . NISSEN FUNDOPLICATION    . POLYPECTOMY  03/12/2017   Procedure: POLYPECTOMY;  Surgeon: Daneil Dolin, MD;  Location: [facility] ENDO SUITE;  Service: Endoscopy;;  cecal x3  . POSTERIOR LAMINECTOMY / DECOMPRESSION LUMBAR SPINE  ~ 1990's  . SKIN CANCER EXCISION     "off my back and chest; from sun"  Family History  Problem Relation Age of Onset  . Cancer Other   . Stroke Other   . Hypertension Mother   . Stroke Mother   . Cancer Father   . Cancer Sister   . Stroke Brother   . Colon cancer Neg Hx    Social History   Tobacco Use  . Smoking status: Never Smoker  . Smokeless tobacco:  Never Used  Substance Use Topics  . Alcohol use: No    Marital Status: Divorced ROS  Review of Systems  Cardiovascular: Negative for chest pain, dyspnea on exertion and leg swelling.  Musculoskeletal: Positive for back pain and joint pain.  Gastrointestinal: Negative for melena.   Objective   Vitals with BMI 07/22/2019 07/05/2019 07/05/2019  Height 5' 8" 5' 8" -  Weight 197 lbs 191 lbs -  BMI 84.66 59.93 -  Systolic 570 177 939  Diastolic 68 87 87  Pulse 54 61 61    Physical Exam Constitutional:      Comments: He is well-built and well-nourished and appears younger than 86.  Cardiovascular:     Rate and Rhythm: Normal rate and regular rhythm.     Pulses: Intact distal pulses.     Heart sounds: Normal heart sounds. No murmur heard.  No gallop.      Comments: No leg edema, no JVD. Pulmonary:     Effort: Pulmonary effort is normal.     Breath sounds: Normal breath sounds.  Abdominal:     General: Bowel sounds are normal.     Palpations: Abdomen is soft.    Laboratory examination:   Recent Labs    01/16/19 1345  NA 132*  K 4.2  CL 98  CO2 25  GLUCOSE 212*  BUN 27*  CREATININE 1.14  CALCIUM 9.4  GFRNONAA 59*  GFRAA >60   CrCl cannot be calculated (Patient's most recent lab result is older than the maximum 21 days allowed.).  CMP Latest Ref Rng & Units 01/16/2019 04/08/2017 04/02/2017  Glucose 70 - 99 mg/dL 212(H) 91 98  BUN 8 - 23 mg/dL 27(H) 8 12  Creatinine 0.61 - 1.24 mg/dL 1.14 0.80 0.89  Sodium 135 - 145 mmol/L 132(L) 138 137  Potassium 3.5 - 5.1 mmol/L 4.2 3.8 3.9  Chloride 98 - 111 mmol/L 98 105 103  CO2 22 - 32 mmol/L _0 Calcium 8.9 - 10.3 mg/dL 9.4 9.8 9.6  Total Protein 6.5 - 8.1 g/dL 6.3(L) 7.3 -  Total Bilirubin 0.3 - 1.2 mg/dL 0.7 0.5 -  Alkaline Phos 38 - 126 U/L 83 86 -  AST 15 - 41 U/L 16 17 -  ALT 0 - 44 U/L 18 17 -   CBC Latest Ref Rng & Units 01/16/2019 04/08/2017 04/02/2017  WBC 4.0 - 10.5 K/uL 8.7 7.0 6.2  Hemoglobin 13.0 -  17.0 g/dL 13.6 12.0(L) 12.0(L)  Hematocrit 39 - 52 % 42.5 37.5(L) 37.7(L)  Platelets 150 - 400 K/uL 273 284 331   HEMOGLOBIN A1C Lab Results  Component Value Date   HGBA1C 6.4 (H) 04/02/2017   MPG 136.98 04/02/2017   External Labs:  02/15/2019: Glucose 217. Albumin 4.07, Potassium 4.4, Glucose 238 (H), Total Bilirubin 0.3, Protein, Total 6.2, eGFR >6.,  AST 14.4, ALT 16 , Chloride 97 (L), Creatinine 0.79, BUN 14, Alkaline Phosphatase 89.  11/08/2018: Serum glucose 149 mg, BUN 16, creatinine 0.89, eGFR >60 mL.  CBC normal.  11/05/2018: TSH 2.41 normal  12/08/2018: Total cholesterol 173, triglycerides 136,  HDL 41, LDL 118. Serum glucose 167 mg, BUN 11, creatinine 0.99, eGFR >70 mL, potassium 3.6.  CMP otherwise normal.  Alkaline phosphatase minimally elevated at 01/07/20.  HB 12.8/HCT 39.9, platelets 297.  TSH normal. PSA mildly elevated.   Medications and allergies  No Known Allergies   Current Outpatient Medications  Medication Instructions  . amLODipine (NORVASC) 10 mg, Oral, Patient received medication from Dr. Woody Seller on 07/02/2019. Patient states he hasn't started the medication yet. Patient is unsure when to start medication.  Marland Kitchen atorvastatin (LIPITOR) 10 MG tablet TAKE 1 TABLET(10 MG) BY MOUTH DAILY  . benazepril (LOTENSIN) 20 mg, Oral, Daily  . carvedilol (COREG) 12.5 mg, Oral, 2 times daily with meals, Take 1 and 1/2 tablets BID   . citalopram (CELEXA) 20 MG tablet 1 tablet, Oral, Daily  . clonazePAM (KLONOPIN) 1 mg, Oral, 2 times daily  . cloNIDine (CATAPRES) 0.1 mg, Oral, When top blood pressure >170  . diltiazem (CARDIZEM CD) 240 mg, Oral, Daily  . docusate sodium (COLACE) 100 mg, Oral, Daily PRN  . famotidine (PEPCID) 40 mg, Oral, Daily  . fluticasone (FLONASE) 50 MCG/ACT nasal spray 1 spray, Each Nare, 2 times daily  . folic acid (FOLVITE) 1 MG tablet 1 tablet, Oral, Daily  . gabapentin (NEURONTIN) 300 MG capsule 2 capsules, Oral, 2 times daily  . glimepiride (AMARYL) 2  mg, Oral, 2 times daily  . hydrALAZINE (APRESOLINE) 25 mg, Oral, 3 times daily  . levothyroxine (SYNTHROID) 125 mcg, Oral, Daily before breakfast, SYNTHROID - brand name only   . LINZESS 290 MCG CAPS capsule 1 capsule, Oral, Daily  . metFORMIN (GLUCOPHAGE) 500 MG tablet Take 2 tabs (1,053m) by mouth every morning & 1 tab (5066m every evening  . pantoprazole (PROTONIX) 40 mg, Daily  . rivaroxaban (XARELTO) 20 mg, Oral, Daily with supper  . tamsulosin (FLOMAX) 0.4 mg, Oral, Daily after supper   Radiology:   Chest x-ray PA and lateral view 11/08/2018: Normal chest x-ray with no active lung disease.  Cardiac Studies:   Coronary  angiogram 06/27/2011: Mild left main disease, mid circumflex 60% stenosis.  Mild luminal irregularity of the RCA and normal LAD.  LVEF 65%.  Lexiscan Tetrofosmin Stress Test 12/27/2018: Nondiagnostic ECG stress. Normal myocardial perfusion. Stress LV EF: 58%.  No previous exam available for comparison. Low risk study.   Echocardiogram 12/09/2018: Left ventricle cavity is normal in size. Moderate concentric hypertrophy of the left ventricle. Normal LV systolic function with EF 65%. Normal global wall motion. Doppler evidence of grade I (impaired) diastolic dysfunction, normal LAP. Mild (Grade I) mitral regurgitation. Mild tricuspid regurgitation. Estimated pulmonary artery systolic pressure is 23 mmHg.  EKG   EKG 07/05/2019: Sinus bradycardia at the rate of 59 bpm, left atrial enlargement, left axis deviation, left anterior fascicular block.  Poor R progression, cannot exclude anteroseptal infarct old.  IVCD, borderline criteria for LVH.    01/31/2019: Sinus bradycardia at rate of 54 bpm, left axis deviation, left anterior block.  Poor R progression, cannot exclude anterolateral infarct old.  Normal QT interval.  No evidence of ischemia. No significant change from  01/10/2019.  01/17/2019: Atypical atrial flutter with variable ventricular response at the rate of  110 bpm, left axis deviation, left anterior fascicular block.  IVCD.  Poor R-wave progression, cannot exclude anterolateral infarct old.  No evidence of ischemia.  Low-voltage limb leads, consider COPD.  Assessment     ICD-10-CM   1. Essential hypertension, benign  I10   2. Paroxysmal atrial  fibrillation ([place])  I48.0      No orders of the defined types were placed in this encounter.  There are no discontinued medications. Recommendations:   Brett Matthews  is a 86 y.o. Caucasian male with paroxysmal atrial fibrillation, resistant hypertension, hyperlipidemia, controlled diabetes mellitus, moderate coronary artery disease in mid circumflex with 60% stenosis by angiography in 2013, paroxysmal atrial fibrillation, last presentation on 01/16/2019 with chest pain and dyspnea SP direct-current cardioversion on 01/18/2019 and presently on flecainide and Xarelto.  We have had difficulty in reconciling his medications.  Today again he did not bring his medication list but states that he agrees with the medication list we have on file and that his blood pressure now has been very well controlled.  States that he is feeling the best he has in quite a while and he also states that he has been maintaining regular rhythm on his heart.  No changes in the medications were done today, I have printed our AVS to him and advised him to carry it in his pocket all the time.  I would like to see him back in 6 months for follow-up.  He will continue to have a close follow-up with his PCP Dr. Woody Seller.   Adrian Prows, MD, [facility] 07/22/2019, 1:13 PM Office: [phone]

## 2019-07-26 DIAGNOSIS — M170 Bilateral primary osteoarthritis of knee: Secondary | ICD-10-CM | POA: Diagnosis not present

## 2019-07-28 DIAGNOSIS — H353132 Nonexudative age-related macular degeneration, bilateral, intermediate dry stage: Secondary | ICD-10-CM | POA: Diagnosis not present

## 2019-07-28 DIAGNOSIS — E119 Type 2 diabetes mellitus without complications: Secondary | ICD-10-CM | POA: Diagnosis not present

## 2019-07-28 DIAGNOSIS — H40013 Open angle with borderline findings, low risk, bilateral: Secondary | ICD-10-CM | POA: Diagnosis not present

## 2019-07-28 DIAGNOSIS — Z961 Presence of intraocular lens: Secondary | ICD-10-CM | POA: Diagnosis not present

## 2019-08-01 ENCOUNTER — Ambulatory Visit: Payer: PPO | Admitting: Cardiology

## 2019-08-01 DIAGNOSIS — Z299 Encounter for prophylactic measures, unspecified: Secondary | ICD-10-CM | POA: Diagnosis not present

## 2019-08-01 DIAGNOSIS — F1320 Sedative, hypnotic or anxiolytic dependence, uncomplicated: Secondary | ICD-10-CM | POA: Diagnosis not present

## 2019-08-01 DIAGNOSIS — J3489 Other specified disorders of nose and nasal sinuses: Secondary | ICD-10-CM | POA: Diagnosis not present

## 2019-08-01 DIAGNOSIS — I4891 Unspecified atrial fibrillation: Secondary | ICD-10-CM | POA: Diagnosis not present

## 2019-08-01 DIAGNOSIS — I10 Essential (primary) hypertension: Secondary | ICD-10-CM | POA: Diagnosis not present

## 2019-08-02 DIAGNOSIS — L03032 Cellulitis of left toe: Secondary | ICD-10-CM | POA: Diagnosis not present

## 2019-08-02 DIAGNOSIS — M79675 Pain in left toe(s): Secondary | ICD-10-CM | POA: Diagnosis not present

## 2019-08-08 DIAGNOSIS — I4891 Unspecified atrial fibrillation: Secondary | ICD-10-CM | POA: Diagnosis not present

## 2019-08-08 DIAGNOSIS — J069 Acute upper respiratory infection, unspecified: Secondary | ICD-10-CM | POA: Diagnosis not present

## 2019-08-08 DIAGNOSIS — Z299 Encounter for prophylactic measures, unspecified: Secondary | ICD-10-CM | POA: Diagnosis not present

## 2019-08-08 DIAGNOSIS — I10 Essential (primary) hypertension: Secondary | ICD-10-CM | POA: Diagnosis not present

## 2019-08-09 DIAGNOSIS — M25562 Pain in left knee: Secondary | ICD-10-CM | POA: Diagnosis not present

## 2019-08-09 DIAGNOSIS — I10 Essential (primary) hypertension: Secondary | ICD-10-CM | POA: Diagnosis not present

## 2019-08-09 DIAGNOSIS — Z299 Encounter for prophylactic measures, unspecified: Secondary | ICD-10-CM | POA: Diagnosis not present

## 2019-08-12 DIAGNOSIS — U071 COVID-19: Secondary | ICD-10-CM | POA: Diagnosis not present

## 2019-08-18 ENCOUNTER — Inpatient Hospital Stay (HOSPITAL_COMMUNITY)
Admission: EM | Admit: 2019-08-18 | Discharge: 2019-08-22 | DRG: 177 | Disposition: A | Discharge status: Home / Self Care | Payer: PPO | Attending: Internal Medicine | Admitting: Internal Medicine

## 2019-08-18 ENCOUNTER — Encounter (HOSPITAL_COMMUNITY): Payer: Self-pay

## 2019-08-18 ENCOUNTER — Emergency Department (HOSPITAL_COMMUNITY): Payer: PPO

## 2019-08-18 ENCOUNTER — Other Ambulatory Visit: Payer: Self-pay

## 2019-08-18 DIAGNOSIS — I10 Essential (primary) hypertension: Secondary | ICD-10-CM | POA: Diagnosis present

## 2019-08-18 DIAGNOSIS — R0902 Hypoxemia: Secondary | ICD-10-CM

## 2019-08-18 DIAGNOSIS — J1282 Pneumonia due to coronavirus disease 2019: Secondary | ICD-10-CM | POA: Diagnosis present

## 2019-08-18 DIAGNOSIS — K219 Gastro-esophageal reflux disease without esophagitis: Secondary | ICD-10-CM | POA: Diagnosis not present

## 2019-08-18 DIAGNOSIS — Z79890 Hormone replacement therapy (postmenopausal): Secondary | ICD-10-CM

## 2019-08-18 DIAGNOSIS — E1165 Type 2 diabetes mellitus with hyperglycemia: Secondary | ICD-10-CM | POA: Diagnosis not present

## 2019-08-18 DIAGNOSIS — Z85828 Personal history of other malignant neoplasm of skin: Secondary | ICD-10-CM | POA: Diagnosis not present

## 2019-08-18 DIAGNOSIS — E039 Hypothyroidism, unspecified: Secondary | ICD-10-CM | POA: Diagnosis present

## 2019-08-18 DIAGNOSIS — I2510 Atherosclerotic heart disease of native coronary artery without angina pectoris: Secondary | ICD-10-CM | POA: Diagnosis present

## 2019-08-18 DIAGNOSIS — Z8249 Family history of ischemic heart disease and other diseases of the circulatory system: Secondary | ICD-10-CM | POA: Diagnosis not present

## 2019-08-18 DIAGNOSIS — Z809 Family history of malignant neoplasm, unspecified: Secondary | ICD-10-CM

## 2019-08-18 DIAGNOSIS — Z79899 Other long term (current) drug therapy: Secondary | ICD-10-CM

## 2019-08-18 DIAGNOSIS — E1142 Type 2 diabetes mellitus with diabetic polyneuropathy: Secondary | ICD-10-CM | POA: Diagnosis not present

## 2019-08-18 DIAGNOSIS — I480 Paroxysmal atrial fibrillation: Secondary | ICD-10-CM | POA: Diagnosis present

## 2019-08-18 DIAGNOSIS — Z8719 Personal history of other diseases of the digestive system: Secondary | ICD-10-CM

## 2019-08-18 DIAGNOSIS — E871 Hypo-osmolality and hyponatremia: Secondary | ICD-10-CM | POA: Diagnosis present

## 2019-08-18 DIAGNOSIS — Z7901 Long term (current) use of anticoagulants: Secondary | ICD-10-CM

## 2019-08-18 DIAGNOSIS — Z7984 Long term (current) use of oral hypoglycemic drugs: Secondary | ICD-10-CM | POA: Diagnosis not present

## 2019-08-18 DIAGNOSIS — R531 Weakness: Secondary | ICD-10-CM | POA: Diagnosis not present

## 2019-08-18 DIAGNOSIS — U071 COVID-19: Principal | ICD-10-CM | POA: Diagnosis present

## 2019-08-18 DIAGNOSIS — J9601 Acute respiratory failure with hypoxia: Secondary | ICD-10-CM | POA: Diagnosis not present

## 2019-08-18 DIAGNOSIS — E785 Hyperlipidemia, unspecified: Secondary | ICD-10-CM | POA: Diagnosis present

## 2019-08-18 DIAGNOSIS — E119 Type 2 diabetes mellitus without complications: Secondary | ICD-10-CM

## 2019-08-18 DIAGNOSIS — R0602 Shortness of breath: Secondary | ICD-10-CM | POA: Diagnosis not present

## 2019-08-18 DIAGNOSIS — Z299 Encounter for prophylactic measures, unspecified: Secondary | ICD-10-CM | POA: Diagnosis not present

## 2019-08-18 DIAGNOSIS — J80 Acute respiratory distress syndrome: Secondary | ICD-10-CM | POA: Diagnosis not present

## 2019-08-18 DIAGNOSIS — Z823 Family history of stroke: Secondary | ICD-10-CM

## 2019-08-18 DIAGNOSIS — E869 Volume depletion, unspecified: Secondary | ICD-10-CM | POA: Diagnosis not present

## 2019-08-18 DIAGNOSIS — F1320 Sedative, hypnotic or anxiolytic dependence, uncomplicated: Secondary | ICD-10-CM | POA: Diagnosis not present

## 2019-08-18 LAB — COMPREHENSIVE METABOLIC PANEL
ALT: 15 U/L (ref 0–44)
AST: 13 U/L — ABNORMAL LOW (ref 15–41)
Albumin: 3.3 g/dL — ABNORMAL LOW (ref 3.5–5.0)
Alkaline Phosphatase: 72 U/L (ref 38–126)
Anion gap: 12 (ref 5–15)
BUN: 15 mg/dL (ref 8–23)
CO2: 23 mmol/L (ref 22–32)
Calcium: 9 mg/dL (ref 8.9–10.3)
Chloride: 96 mmol/L — ABNORMAL LOW (ref 98–111)
Creatinine, Ser: 0.77 mg/dL (ref 0.61–1.24)
GFR calc Af Amer: >60 mL/min (ref >60)
GFR calc non Af Amer: >60 mL/min (ref >60)
Glucose, Bld: 209 mg/dL — ABNORMAL HIGH (ref 70–99)
Potassium: 3.6 mmol/L (ref 3.5–5.1)
Sodium: 131 mmol/L — ABNORMAL LOW (ref 135–145)
Total Bilirubin: 0.7 mg/dL (ref 0.3–1.2)
Total Protein: 6.3 g/dL — ABNORMAL LOW (ref 6.5–8.1)

## 2019-08-18 LAB — CBC WITH DIFFERENTIAL/PLATELET
Abs Immature Granulocytes: 0.12 10*3/uL — ABNORMAL HIGH (ref 0.00–0.07)
Basophils Absolute: 0 10*3/uL (ref 0.0–0.1)
Basophils Relative: 0 %
Eosinophils Absolute: 0 10*3/uL (ref 0.0–0.5)
Eosinophils Relative: 0 %
HCT: 34.5 % — ABNORMAL LOW (ref 39.0–52.0)
Hemoglobin: 10.9 g/dL — ABNORMAL LOW (ref 13.0–17.0)
Immature Granulocytes: 2 %
Lymphocytes Relative: 7 %
Lymphs Abs: 0.5 10*3/uL — ABNORMAL LOW (ref 0.7–4.0)
MCH: 26.3 pg (ref 26.0–34.0)
MCHC: 31.6 g/dL (ref 30.0–36.0)
MCV: 83.3 fL (ref 80.0–100.0)
Monocytes Absolute: 0.5 10*3/uL (ref 0.1–1.0)
Monocytes Relative: 6 %
Neutro Abs: 6.7 10*3/uL (ref 1.7–7.7)
Neutrophils Relative %: 85 %
Platelets: 264 10*3/uL (ref 150–400)
RBC: 4.14 MIL/uL — ABNORMAL LOW (ref 4.22–5.81)
RDW: 14.8 % (ref 11.5–15.5)
WBC: 7.9 10*3/uL (ref 4.0–10.5)
nRBC: 0 % (ref 0.0–0.2)

## 2019-08-18 LAB — SARS CORONAVIRUS 2 BY RT PCR (HOSPITAL ORDER, PERFORMED IN ~~LOC~~ HOSPITAL LAB): SARS Coronavirus 2: POSITIVE — AB

## 2019-08-18 LAB — D-DIMER, QUANTITATIVE: D-Dimer, Quant: 0.5 ug/mL-FEU (ref 0.00–0.50)

## 2019-08-18 LAB — FERRITIN: Ferritin: 84 ng/mL (ref 24–336)

## 2019-08-18 LAB — C-REACTIVE PROTEIN: CRP: 7.9 mg/dL — ABNORMAL HIGH (ref <1.0)

## 2019-08-18 LAB — LACTIC ACID, PLASMA: Lactic Acid, Venous: 1 mmol/L (ref 0.5–1.9)

## 2019-08-18 LAB — PROCALCITONIN: Procalcitonin: <0.1 ng/mL

## 2019-08-18 LAB — TRIGLYCERIDES: Triglycerides: 130 mg/dL (ref <150)

## 2019-08-18 LAB — FIBRINOGEN: Fibrinogen: 424 mg/dL (ref 210–475)

## 2019-08-18 LAB — LACTATE DEHYDROGENASE: LDH: 141 U/L (ref 98–192)

## 2019-08-18 MED ORDER — RIVAROXABAN 20 MG PO TABS
20.0000 mg | ORAL_TABLET | Freq: Every day | ORAL | Status: DC
  Administered 2019-08-19 – 2019-08-22 (×4): 20 mg via ORAL
  Filled 2019-08-18 (×4): qty 1

## 2019-08-18 MED ORDER — CARVEDILOL 12.5 MG PO TABS: 12.5000 mg | ORAL_TABLET | Freq: Once | ORAL | Status: DC

## 2019-08-18 MED ORDER — DILTIAZEM HCL ER COATED BEADS 240 MG PO CP24
240.0000 mg | ORAL_CAPSULE | Freq: Every day | ORAL | Status: DC
  Filled 2019-08-18: qty 1

## 2019-08-18 MED ORDER — SODIUM CHLORIDE 0.9 % IV SOLN
100.0000 mg | Freq: Every day | INTRAVENOUS | Status: AC
  Administered 2019-08-19 – 2019-08-22 (×4): 100 mg via INTRAVENOUS
  Filled 2019-08-18 (×5): qty 20

## 2019-08-18 MED ORDER — SODIUM CHLORIDE 0.9 % IV SOLN
100.0000 mg | INTRAVENOUS | Status: AC
  Administered 2019-08-18 (×2): 100 mg via INTRAVENOUS
  Filled 2019-08-18: qty 20

## 2019-08-18 MED ORDER — DEXAMETHASONE SODIUM PHOSPHATE 10 MG/ML IJ SOLN
6.0000 mg | Freq: Once | INTRAMUSCULAR | Status: AC
  Administered 2019-08-18: 6 mg via INTRAVENOUS
  Filled 2019-08-18: qty 0.6

## 2019-08-18 MED ORDER — CARVEDILOL 12.5 MG PO TABS: 12.5000 mg | ORAL_TABLET | Freq: Two times a day (BID) | ORAL | Status: DC

## 2019-08-18 MED ORDER — ACETAMINOPHEN 500 MG PO TABS
1000.0000 mg | ORAL_TABLET | Freq: Once | ORAL | Status: AC
  Administered 2019-08-18: 1000 mg via ORAL

## 2019-08-18 MED ORDER — HYDRALAZINE HCL 25 MG PO TABS: 25.0000 mg | ORAL_TABLET | Freq: Three times a day (TID) | ORAL | Status: DC

## 2019-08-18 NOTE — ED Triage Notes (Signed)
Pt brought to ED via [facility] for increased SOB and weakness. O2 sats 88% PTA, placed on 4L. Pt covid positive.

## 2019-08-18 NOTE — ED Notes (Signed)
Nurse notified MD oral temp 101.6; order for tylenol pending from MD

## 2019-08-18 NOTE — H&P (Addendum)
History and Physical    Brett Matthews ZDG:[id number] DOB: 1932/02/10 DOA: 08/18/2019  PCP: Brett Chroman, MD   Patient coming from: Home  I have personally briefly reviewed patient's old medical records in [place]  Chief Complaint: Difficulty breathing, Covid positive  HPI: Brett Matthews is a 86 y.o. male with medical history significant for diabetes mellitus, hypertension, paroxysmal atrial fibrillation. Scented to the ED with complaints of increasing difficulty breathing, weakness over the past few days.  Was diagnosed with Covid at a pharmacy in [place].  Reports fevers, productive cough. He denies chest pain, no lower extremity swelling.  He did not receive the Covid vaccine.  ED Course: temp max-101.6, heart rate 90s to 104, respiratory rate 19-31.  Blood pressure 120s to 130s.  O2 sats 88% on room air, currently on 3 L sats 92%- 95%.  Procalcitonin less than 0.1.  Elevation in some inflammatory markers.  Lactic acid 1.  Portable chest x-ray shows hazy patchy opacities-reflecting infection or edema.  Dexamethasone and remdesivir started in the ED.  Hospitalist to admit for acute respiratory failure secondary to Covid pneumonia.  Review of Systems: As per HPI all other systems reviewed and negative.  Past Medical History:  Diagnosis Date  . Anemia, iron deficiency    Negative capsule endoscopy  . Arthritis   . Coronary atherosclerosis of native coronary artery    60 % circumflex stenosis; EF 65%, Cath 6/13  . Diverticula of colon    Pancolonic  . DM (diabetes mellitus), type 2, uncontrolled ([place])   . Dyspnea    Normal cardiopulmonary function test in July 2008, negative echocardiogram with "bubble" study for inter-cardiac shunt.  . Essential hypertension, benign   . Gastroesophageal reflux disease   . Hemorrhoids   . Hyperplastic colon polyp 04/06/2007  . Hypothyroidism 2003   Status post total thyroidectomy  . Migraines   . Neoplasm of lymphatic and hematopoietic  tissue   . Paroxysmal atrial fibrillation ([place])    Diagnosed 2005  . Skin cancer     Past Surgical History:  Procedure Laterality Date  . BACK SURGERY    . BONE MARROW BIOPSY  1990's  . CARDIAC CATHETERIZATION    . CARDIOVERSION N/A 01/18/2019   Procedure: CARDIOVERSION;  Surgeon: Adrian Prows, MD;  Location: [facility] ENDOSCOPY;  Service: Cardiovascular;  Laterality: N/A;  . CATARACT EXTRACTION Bilateral   . COLONOSCOPY  04/06/2007   Dr. Gala Romney- Normal rectum with scattered pancolonic diverticula and slightly redundant elongated colon, diminutive polpy midsigmoid, remainder of colonic mucosa appeared normal. bx= hyperplastic polyp  . COLONOSCOPY N/A 11/29/2012   VZD:GLOVFIEP preparation. Friable anal canal/internal hemorrhoids; otherwise, normal rectum. Normal-appearing colonic mucosa  . COLONOSCOPY WITH PROPOFOL N/A 03/12/2017   Procedure: COLONOSCOPY WITH PROPOFOL;  Surgeon: Daneil Dolin, MD;  Location: [facility] ENDO SU[facility];  Service: Endoscopy;  Laterality: N/A;  7:30am  . ESOPHAGOGASTRODUODENOSCOPY  04/06/2007   Dr. Gala Romney- normal esophagus s/p nissen fundoplication, intact nissen wrap o/w normal stomach  . ESOPHAGOGASTRODUODENOSCOPY N/A 11/29/2012   RMR: Abnormall distal esophagus bx c/w GERD. prior fundoplication. Gastric polyps  -bx benign. Status post biopsy of normal--appearing duodenal mucosa (bx neg)  . HEMORRHOID SURGERY N/A 04/06/2017   Procedure: EXTENSIVE HEMORRHOIDECTOMY;  Surgeon: Aviva Signs, MD;  Location: [facility] ORS;  Service: General;  Laterality: N/A;  . LEFT HEART CATHETERIZATION WITH CORONARY ANGIOGRAM N/A 06/27/2011   Procedure: LEFT HEART CATHETERIZATION WITH CORONARY ANGIOGRAM;  Surgeon: Thayer Headings, MD;  Location: [facility] CATH LAB;  Service:  Cardiovascular;  Laterality: N/A;  . NISSEN FUNDOPLICATION    . POLYPECTOMY  03/12/2017   Procedure: POLYPECTOMY;  Surgeon: Daneil Dolin, MD;  Location: [facility] ENDO SUITE;  Service: Endoscopy;;  cecal x3  . POSTERIOR LAMINECTOMY / DECOMPRESSION  LUMBAR SPINE  ~ 1990's  . SKIN CANCER EXCISION     "off my back and chest; from sun"     reports that he has never smoked. He has never used smokeless tobacco. He reports that he does not drink alcohol and does not use drugs.  No Known Allergies  Family History  Problem Relation Age of Onset  . Cancer Other   . Stroke Other   . Hypertension Mother   . Stroke Mother   . Cancer Father   . Cancer Sister   . Stroke Brother   . Colon cancer Neg Hx     Prior to Admission medications   Medication Sig Start Date End Date Taking? Authorizing Provider  amLODipine (NORVASC) 10 MG tablet Take 10 mg by mouth. Patient received medication from Dr. Woody Seller on 07/02/2019. Patient states he hasn't started the medication yet. Patient is unsure when to start medication. 04/04/19   [provider]  atorvastatin (LIPITOR) 10 MG tablet TAKE 1 TABLET(10 MG) BY MOUTH DAILY 07/05/19   Adrian Prows, MD  benazepril (LOTENSIN) 40 MG tablet Take 0.5 tablets (20 mg total) by mouth daily. Patient taking differently: Take 20 mg by mouth daily. Take one tablet daily 01/18/19   Adrian Prows, MD  carvedilol (COREG) 12.5 MG tablet Take 12.5 mg by mouth 2 (two) times daily with a meal. Take 1 and 1/2 tablets BID    [provider]  citalopram (CELEXA) 20 MG tablet Take 1 tablet by mouth daily. 03/23/17   [provider]  clonazePAM (KLONOPIN) 1 MG tablet Take 1 mg by mouth 2 (two) times daily.    [provider]  cloNIDine (CATAPRES) 0.1 MG tablet Take 0.1 mg by mouth. When top blood pressure >170 11/25/18   [provider]  diltiazem (CARDIZEM CD) 240 MG 24 hr capsule Take 240 mg by mouth daily.    [provider]  docusate sodium (COLACE) 100 MG capsule Take 100 mg by mouth daily as needed for mild constipation.    [provider]  famotidine (PEPCID) 40 MG tablet Take 40 mg by mouth daily.    [provider]  fluticasone (FLONASE) 50 MCG/ACT nasal spray  Place 1 spray into both nostrils 2 (two) times daily.    [provider]  folic acid (FOLVITE) 1 MG tablet Take 1 tablet by mouth daily. 03/12/17   [provider]  gabapentin (NEURONTIN) 300 MG capsule Take 2 capsules by mouth 2 (two) times daily. 10/29/18   [provider]  glimepiride (AMARYL) 2 MG tablet Take 2 mg by mouth 2 (two) times daily. 03/10/17   [provider]  hydrALAZINE (APRESOLINE) 25 MG tablet Take 1 tablet (25 mg total) by mouth 3 (three) times daily. 05/19/19 08/17/19  Adrian Prows, MD  levothyroxine (SYNTHROID) 125 MCG tablet Take 125 mcg by mouth daily before breakfast. SYNTHROID - brand name only    [provider]  LINZESS 290 MCG CAPS capsule Take 1 capsule by mouth daily. 08/19/18   [provider]  metFORMIN (GLUCOPHAGE) 500 MG tablet Take 2 tabs (1,'000mg'$ ) by mouth every morning & 1 tab ('500mg'$ ) every evening     [provider]  pantoprazole (PROTONIX) 40 MG tablet Take 40  mg by mouth daily.      [provider]  rivaroxaban (XARELTO) 20 MG TABS tablet Take 20 mg by mouth daily with supper.    [provider]  tamsulosin (FLOMAX) 0.4 MG CAPS capsule Take 0.4 mg by mouth daily after supper.  09/09/12   [provider]    Physical Exam: Vitals:   08/18/19 1534 08/18/19 1537 08/18/19 1538 08/18/19 1600  BP:  123/71  138/87  Pulse:  91    Resp:  14  (!) 25  Temp:  (!) 100.5 F (38.1 C)    TempSrc:  Oral    SpO2:  (!) 88% 92% 93%  Weight: 95.7 kg     Height: 5\' 11"  (1.803 m)       Constitutional: Hard of hearing, calm, comfortable Vitals:   08/18/19 1534 08/18/19 1537 08/18/19 1538 08/18/19 1600  BP:  123/71  138/87  Pulse:  91    Resp:  14  (!) 25  Temp:  (!) 100.5 F (38.1 C)    TempSrc:  Oral    SpO2:  (!) 88% 92% 93%  Weight: 95.7 kg     Height: 5\' 11"  (1.803 m)      Eyes: PERRL, lids and conjunctivae normal ENMT: Mucous membranes are dry.  Neck: normal, supple, no  masses, no thyromegaly Respiratory: Normal respiratory effort. No accessory muscle use.  Cardiovascular: Regular rate and rhythm, No extremity edema. 2+ pedal pulses. No carotid bruits.  Abdomen: no tenderness, no masses palpated. No hepatosplenomegaly. Bowel sounds positive.  Musculoskeletal: no clubbing / cyanosis. No joint deformity upper and lower extremities. Good ROM, no contractures..  Skin: no rashes, lesions, ulcers. No induration Neurologic: No apparent cranial abnormality, moving extremities spontaneously. Psychiatric: Normal judgment and insight. Alert and oriented x 3. Normal mood.   Labs on Admission: I have personally reviewed following labs and imaging studies  CBC: Recent Labs  Lab 08/18/19 1615  WBC 7.9  NEUTROABS 6.7  HGB 10.9*  HCT 34.5*  MCV 83.3  PLT 264   Basic Metabolic Panel: Recent Labs  Lab 08/18/19 1615  NA 131*  K 3.6  CL 96*  CO2 23  GLUCOSE 209*  BUN 15  CREATININE 0.77  CALCIUM 9.0   Liver Function Tests: Recent Labs  Lab 08/18/19 1615  AST 13*  ALT 15  ALKPHOS 72  BILITOT 0.7  PROT 6.3*  ALBUMIN 3.3*   Lipid Profile: Recent Labs    08/18/19 1540  TRIG 130   Anemia Panel: Recent Labs    08/18/19 1615  FERRITIN 84   Radiological Exams on Admission: DG Chest Port 1 View  Result Date: 08/18/2019 CLINICAL DATA:  Shortness of breath EXAM: PORTABLE CHEST 1 VIEW COMPARISON:  Radiograph 01/16/2019 FINDINGS: Diminished lung volumes with some likely increasing vascular congestion as well as mixed hazy and patchy opacities towards the lung bases. No pneumothorax or visible effusion. The aorta is calcified. The remaining cardiomediastinal contours are unremarkable. Surgical clips noted at the base of the neck. Degenerative changes are present in the imaged spine and shoulders. Evidence of prior bilateral rotator cuff repairs. Telemetry leads overlie the chest. IMPRESSION: Diminished lung volumes with some likely increasing vascular  congestion as well as mixed hazy and patchy opacities towards the lung bases, could reflect developing infection and/or edema. Electronically Signed   By: 10/18/2019 M.D.   On: 08/18/2019 16:08    EKG: Independently reviewed.  Sinus rhythm, QTC 459.  PVCs PACs.  T Wave inversion  in V2 V3 and aVL.    Assessment/Plan Principal Problem:   Pneumonia due to COVID-19 virus Active Problems:   Hypothyroidism   Type 2 diabetes mellitus without complication, without long-term current use of insulin (HCC)   Essential hypertension, benign   Paroxysmal atrial fibrillation (HCC)   Dyslipidemia   Pneumonia due to COVID-19 infection with acute respiratory failure-dyspnea, cough, fevers.  O2 sats 88% on room air, currently on 3 L O2, sats 92 to 95%.  Portable chest x-ray hazy/patchy opacities.  Did not receive COVID-19 vaccine. -Continue IV dexamethasone -IV Remdesivir pharmacy to dose - Procalcitonin less than 0.1, WBC 7.9, will hold off on antibiotics - CMP , CBC in the morning -Trend inflammatory markers -COVID-19 admission protocol -Supplemental O2, flutter valve, albuterol inhaler, mucolytic's -Multivitamins  Paroxysmal atrial fibrillation-heart rate 90s to 114.  On anticoagulation.  Patient tells me he took his medications today. T wave changes V2, V3, AVL. No chest pain. -Resume Xarelto in a.m. -Resume rate limiting medications carvedilol and diltiazem for now, awaiting med reconciliation - Repeat EKG a.m  Diabetes mellitus-random glucose 209. - SSI- S -Hold home metoprolol, glimepiride - Hgba1c  Hypertension-stable. -Awaiting med reconciliation, he is on several antihypertensive medications, per notes his blood pressure has been hard to control.  Norvasc, benazepril, carvedilol, Cardizem, clonidine PRN, hydralazine -Will resume hydralazine, carvedilol and diltiazem for now.  DVT prophylaxis: Xarelto Code Status: Full code Family Communication: None at bedside Disposition Plan: > 2  days, pending improvement in respiratory status and treatment for Covid pneumonia. Consults called: None. Admission status: Inpatient, telemetry I certify that at the point of admission it is my clinical judgment that the patient will require inpatient hospital care spanning beyond 2 midnights from the point of admission due to high intensity of service, high risk for further deterioration and high frequency of surveillance required.    Bethena Roys MD [facility]  08/18/2019, 5:28 PM

## 2019-08-18 NOTE — ED Provider Notes (Signed)
[facility] EMERGENCY DEPARTMENT Provider Note   CSN: [id number] Arrival date & time: 08/18/19  1512     History Chief Complaint  Patient presents with   Shortness of Breath    Brett Matthews is a 86 y.o. male.  HPI Patient presents for shortness of breath and weakness reportedly Covid positive from a test done at the pharmacy in [place] although does not have test results with him and we do not have access to the test.  Reports been feeling bad for a few days.  Sats of 88 requiring oxygen.  Not normally on oxygen.  Fevers and productive cough.  No abdominal pain.  Patient has not had the Covid vaccine.    Past Medical History:  Diagnosis Date   Anemia, iron deficiency    Negative capsule endoscopy   Arthritis    Coronary atherosclerosis of native coronary artery    60 % circumflex stenosis; EF 65%, Cath 6/13   Diverticula of colon    Pancolonic   DM (diabetes mellitus), type 2, uncontrolled ([place])    Dyspnea    Normal cardiopulmonary function test in July 2008, negative echocardiogram with "bubble" study for inter-cardiac shunt.   Essential hypertension, benign    Gastroesophageal reflux disease    Hemorrhoids    Hyperplastic colon polyp 04/06/2007   Hypothyroidism 2003   Status post total thyroidectomy   Migraines    Neoplasm of lymphatic and hematopoietic tissue    Paroxysmal atrial fibrillation ([place])    Diagnosed 2005   Skin cancer     Patient Active Problem List   Diagnosis Date Noted   Pneumonia due to COVID-19 virus 08/18/2019   Chest pain of uncertain etiology 27/06/2374   H/O hemorrhoidectomy    Bleeding hemorrhoids 03/24/2017   Abdominal pain 03/02/2017   Anemia 03/02/2017   Rectal bleeding 03/17/2013   Dyslipidemia 07/24/2010   CAD, NATIVE VESSEL 09/26/2008   Hypothyroidism 08/06/2007   Type 2 diabetes mellitus without complication, without long-term current use of insulin ([place]) 08/06/2007   Essential hypertension, benign  08/06/2007   GASTROESOPHAGEAL REFLUX DISEASE, CHRONIC 08/06/2007   Paroxysmal atrial fibrillation ([place]) 08/06/2007    Past Surgical History:  Procedure Laterality Date   BACK SURGERY     BONE MARROW BIOPSY  1990's   CARDIAC CATHETERIZATION     CARDIOVERSION N/A 01/18/2019   Procedure: CARDIOVERSION;  Surgeon: Adrian Prows, MD;  Location: [place];  Service: Cardiovascular;  Laterality: N/A;   CATARACT EXTRACTION Bilateral    COLONOSCOPY  04/06/2007   Dr. Gala Romney- Normal rectum with scattered pancolonic diverticula and slightly redundant elongated colon, diminutive polpy midsigmoid, remainder of colonic mucosa appeared normal. bx= hyperplastic polyp   COLONOSCOPY N/A 11/29/2012   EGB:TDVVOHYW preparation. Friable anal canal/internal hemorrhoids; otherwise, normal rectum. Normal-appearing colonic mucosa   COLONOSCOPY WITH PROPOFOL N/A 03/12/2017   Procedure: COLONOSCOPY WITH PROPOFOL;  Surgeon: Daneil Dolin, MD;  [facility];  Service: Endoscopy;  Laterality: N/A;  7:30am   ESOPHAGOGASTRODUODENOSCOPY  04/06/2007   Dr. Gala Romney- normal esophagus s/p nissen fundoplication, intact nissen wrap o/w normal stomach   ESOPHAGOGASTRODUODENOSCOPY N/A 11/29/2012   RMR: Abnormall distal esophagus bx c/w GERD. prior fundoplication. Gastric polyps  -bx benign. Status post biopsy of normal--appearing duodenal mucosa (bx neg)   HEMORRHOID SURGERY N/A 04/06/2017   Procedure: EXTENSIVE HEMORRHOIDECTOMY;  Surgeon: Aviva Signs, MD;  Location: [facility];  Service: General;  Laterality: N/A;   LEFT HEART CATHETERIZATION WITH CORONARY ANGIOGRAM N/A 06/27/2011   Procedure: LEFT HEART  CATHETERIZATION WITH CORONARY ANGIOGRAM;  Surgeon: Thayer Headings, MD;  Location: [facility] CATH LAB;  Service: Cardiovascular;  Laterality: N/A;   NISSEN FUNDOPLICATION     POLYPECTOMY  03/12/2017   Procedure: POLYPECTOMY;  Surgeon: Daneil Dolin, MD;  Location: [facility];  Service: Endoscopy;;  cecal x3    POSTERIOR LAMINECTOMY / DECOMPRESSION LUMBAR SPINE  ~ 1990's   SKIN CANCER EXCISION     "off my back and chest; from sun"       Family History  Problem Relation Age of Onset   Cancer Other    Stroke Other    Hypertension Mother    Stroke Mother    Cancer Father    Cancer Sister    Stroke Brother    Colon cancer Neg Hx     Social History   Tobacco Use   Smoking status: Never Smoker   Smokeless tobacco: Never Used  Vaping Use   Vaping Use: Never used  Substance Use Topics   Alcohol use: No   Drug use: No    Home Medications Prior to Admission medications   Medication Sig Start Date End Date Taking? Authorizing Provider  amLODipine (NORVASC) 10 MG tablet Take 10 mg by mouth. Patient received medication from Dr. Woody Seller on 07/02/2019. Patient states he hasn't started the medication yet. Patient is unsure when to start medication. 04/04/19   [provider]  atorvastatin (LIPITOR) 10 MG tablet TAKE 1 TABLET(10 MG) BY MOUTH DAILY 07/05/19   Adrian Prows, MD  benazepril (LOTENSIN) 40 MG tablet Take 0.5 tablets (20 mg total) by mouth daily. Patient taking differently: Take 20 mg by mouth daily. Take one tablet daily 01/18/19   Adrian Prows, MD  carvedilol (COREG) 12.5 MG tablet Take 12.5 mg by mouth 2 (two) times daily with a meal. Take 1 and 1/2 tablets BID    [provider]  citalopram (CELEXA) 20 MG tablet Take 1 tablet by mouth daily. 03/23/17   [provider]  clonazePAM (KLONOPIN) 1 MG tablet Take 1 mg by mouth 2 (two) times daily.    [provider]  cloNIDine (CATAPRES) 0.1 MG tablet Take 0.1 mg by mouth. When top blood pressure >170 11/25/18   [provider]  diltiazem (CARDIZEM CD) 240 MG 24 hr capsule Take 240 mg by mouth daily.    [provider]  docusate sodium (COLACE) 100 MG capsule Take 100 mg by mouth daily as needed for mild constipation.    [provider]  famotidine (PEPCID) 40 MG tablet Take  40 mg by mouth daily.    [provider]  fluticasone (FLONASE) 50 MCG/ACT nasal spray Place 1 spray into both nostrils 2 (two) times daily.    [provider]  folic acid (FOLVITE) 1 MG tablet Take 1 tablet by mouth daily. 03/12/17   [provider]  gabapentin (NEURONTIN) 300 MG capsule Take 2 capsules by mouth 2 (two) times daily. 10/29/18   [provider]  glimepiride (AMARYL) 2 MG tablet Take 2 mg by mouth 2 (two) times daily. 03/10/17   [provider]  hydrALAZINE (APRESOLINE) 25 MG tablet Take 1 tablet (25 mg total) by mouth 3 (three) times daily. 05/19/19 08/17/19  Adrian Prows, MD  levothyroxine (SYNTHROID) 125 MCG tablet Take 125 mcg by mouth daily before breakfast. SYNTHROID - brand name only    [provider]  LINZESS 290 MCG CAPS capsule Take 1 capsule by mouth daily. 08/19/18   [provider]  metFORMIN (GLUCOPHAGE) 500 MG tablet Take 2 tabs (1,'000mg'$ ) by mouth every morning & 1 tab ('500mg'$ ) every evening     [provider]  pantoprazole (PROTONIX) 40 MG tablet Take 40 mg by mouth daily.      [provider]  rivaroxaban (XARELTO) 20 MG TABS tablet Take 20 mg by mouth daily with supper.    [provider]  tamsulosin (FLOMAX) 0.4 MG CAPS capsule Take 0.4 mg by mouth daily after supper.  09/09/12   [provider]    Allergies    Patient has no known allergies.  Review of Systems   Review of Systems  Constitutional: Positive for fatigue and fever. Negative for appetite change.  HENT: Negative for congestion.   Respiratory: Positive for cough and shortness of breath.   Cardiovascular: Negative for chest pain.  Gastrointestinal: Negative for abdominal pain.  Genitourinary: Negative for flank pain.  Musculoskeletal: Negative for back pain.  Skin: Negative for wound.  Neurological: Negative for weakness.    Physical Exam Updated Vital Signs BP 139/79    Pulse 81    Temp 100 F (37.8 C)  (Oral)    Resp 20    Ht '5\' 11"'$  (1.803 m)    Wt 95.7 kg    SpO2 94%    BMI 29.43 kg/m   Physical Exam Vitals and nursing note reviewed.  HENT:     Head: Normocephalic.  Cardiovascular:     Rate and Rhythm: Regular rhythm.  Pulmonary:     Comments: Mildly harsh breath sounds throughout Chest:     Chest wall: No tenderness.  Musculoskeletal:     Right lower leg: No tenderness.     Left lower leg: No tenderness.  Skin:    General: Skin is warm.     Capillary Refill: Capillary refill takes less than 2 seconds.  Neurological:     Mental Status: He is alert.     Comments: Somewhat difficult to get a history from.  Either mild confusion or may be due to his being hard of hearing.     ED Results / Procedures / Treatments   Labs (all labs ordered are listed, but only abnormal results are displayed) Labs Reviewed  SARS CORONAVIRUS 2 BY RT PCR ([place]) - Abnormal; Notable for the following components:      Result Value   SARS Coronavirus 2 POSITIVE (*)    All other components within normal limits  CBC WITH DIFFERENTIAL/PLATELET - Abnormal; Notable for the following components:   RBC 4.14 (*)    Hemoglobin 10.9 (*)    HCT 34.5 (*)    Lymphs Abs 0.5 (*)    Abs Immature Granulocytes 0.12 (*)    All other components within normal limits  COMPREHENSIVE METABOLIC PANEL - Abnormal; Notable for the following components:   Sodium 131 (*)    Chloride 96 (*)    Glucose, Bld 209 (*)    Total Protein 6.3 (*)    Albumin 3.3 (*)    AST 13 (*)    All other components within normal limits  C-REACTIVE PROTEIN - Abnormal; Notable for the following components:   CRP 7.9 (*)    All other components within normal limits  CULTURE, BLOOD (ROUTINE X 2)  CULTURE, BLOOD (ROUTINE X 2)  LACTIC ACID, PLASMA  D-DIMER, QUANTITATIVE (NOT AT [facility])  PROCALCITONIN  LACTATE DEHYDROGENASE  FERRITIN  TRIGLYCERIDES  FIBRINOGEN  HEMOGLOBIN A1C    EKG EKG  Interpretation  Date/Time:  Thursday August 18 2019 15:38:32 EDT Ventricular Rate:  91 PR Interval:    QRS Duration: 146 QT Interval:  373 QTC Calculation: 459 R Axis:   -87 Text Interpretation: Sinus rhythm Multiple premature complexes, vent & supraven RBBB and LAFB Baseline wander in lead(s) V1 Confirmed by Davonna Belling [phone]) on 08/18/2019 3:45:59 PM   Radiology DG Chest Port 1 View  Result Date: 08/18/2019 CLINICAL DATA:  Shortness of breath EXAM: PORTABLE CHEST 1 VIEW COMPARISON:  Radiograph 01/16/2019 FINDINGS: Diminished lung volumes with some likely increasing vascular congestion as well as mixed hazy and patchy opacities towards the lung bases. No pneumothorax or visible effusion. The aorta is calcified. The remaining cardiomediastinal contours are unremarkable. Surgical clips noted at the base of the neck. Degenerative changes are present in the imaged spine and shoulders. Evidence of prior bilateral rotator cuff repairs. Telemetry leads overlie the chest. IMPRESSION: Diminished lung volumes with some likely increasing vascular congestion as well as mixed hazy and patchy opacities towards the lung bases, could reflect developing infection and/or edema. Electronically Signed   By: Lovena Le M.D.   On: 08/18/2019 16:08    Procedures Procedures (including critical care time)  Medications Ordered in ED Medications  remdesivir 100 mg in sodium chloride 0.9 % 100 mL IVPB (has no administration in time range)  rivaroxaban (XARELTO) tablet 20 mg (has no administration in time range)  hydrALAZINE (APRESOLINE) tablet 25 mg (25 mg Oral Not Given 08/18/19 2105)  carvedilol (COREG) tablet 12.5 mg (12.5 mg Oral Not Given 08/18/19 1950)  diltiazem (CARDIZEM CD) 24 hr capsule 240 mg (has no administration in time range)  dexamethasone (DECADRON) injection 6 mg (6 mg Intravenous Given 08/18/19 1806)  remdesivir 100 mg in sodium chloride 0.9 % 100 mL IVPB (0 mg Intravenous Stopped 08/18/19  2023)  acetaminophen (TYLENOL) tablet 1,000 mg (1,000 mg Oral Given 08/18/19 1820)    ED Course  I have reviewed the triage vital signs and the nursing notes.  Pertinent labs & imaging results that were available during my care of the patient were reviewed by me and considered in my medical decision making (see chart for details).    MDM Rules/Calculators/A&P                          Patient with known Covid infection.  New hypoxia.  X-ray shows multifocal pneumonia.  Will admit to hospitalist.  Requiring 3 L of nasal cannula at the time of admission.  CRITICAL CARE Performed by: Davonna Belling Total critical care time: 30 minutes Critical care time was exclusive of separately billable procedures and treating other patients. Critical care was necessary to treat or prevent imminent or life-threatening deterioration. Critical care was time spent personally by me on the following activities: development of treatment plan with patient and/or surrogate as well as nursing, discussions with consultants, evaluation of patient's response to treatment, examination of patient, obtaining history from patient or surrogate, ordering and performing treatments and interventions, ordering and review of laboratory studies, ordering and review of radiographic studies, pulse oximetry and re-evaluation of patient's condition.  Final Clinical Impression(s) / ED Diagnoses Final diagnoses:  COVID-19  Hypoxia    Rx / DC Orders ED Discharge Orders    None       Davonna Belling, MD 08/18/19 2332

## 2019-08-19 DIAGNOSIS — E785 Hyperlipidemia, unspecified: Secondary | ICD-10-CM

## 2019-08-19 DIAGNOSIS — J9601 Acute respiratory failure with hypoxia: Secondary | ICD-10-CM

## 2019-08-19 DIAGNOSIS — E119 Type 2 diabetes mellitus without complications: Secondary | ICD-10-CM

## 2019-08-19 DIAGNOSIS — I480 Paroxysmal atrial fibrillation: Secondary | ICD-10-CM

## 2019-08-19 DIAGNOSIS — I10 Essential (primary) hypertension: Secondary | ICD-10-CM

## 2019-08-19 LAB — CBC WITH DIFFERENTIAL/PLATELET
Abs Immature Granulocytes: 0.07 10*3/uL (ref 0.00–0.07)
Basophils Absolute: 0 10*3/uL (ref 0.0–0.1)
Basophils Relative: 0 %
Eosinophils Absolute: 0 10*3/uL (ref 0.0–0.5)
Eosinophils Relative: 0 %
HCT: 34.8 % — ABNORMAL LOW (ref 39.0–52.0)
Hemoglobin: 11.1 g/dL — ABNORMAL LOW (ref 13.0–17.0)
Immature Granulocytes: 1 %
Lymphocytes Relative: 6 %
Lymphs Abs: 0.4 10*3/uL — ABNORMAL LOW (ref 0.7–4.0)
MCH: 26.4 pg (ref 26.0–34.0)
MCHC: 31.9 g/dL (ref 30.0–36.0)
MCV: 82.9 fL (ref 80.0–100.0)
Monocytes Absolute: 0.2 10*3/uL (ref 0.1–1.0)
Monocytes Relative: 3 %
Neutro Abs: 5.2 10*3/uL (ref 1.7–7.7)
Neutrophils Relative %: 90 %
Platelets: 237 10*3/uL (ref 150–400)
RBC: 4.2 MIL/uL — ABNORMAL LOW (ref 4.22–5.81)
RDW: 14.6 % (ref 11.5–15.5)
WBC: 5.8 10*3/uL (ref 4.0–10.5)
nRBC: 0 % (ref 0.0–0.2)

## 2019-08-19 LAB — COMPREHENSIVE METABOLIC PANEL
ALT: 16 U/L (ref 0–44)
AST: 12 U/L — ABNORMAL LOW (ref 15–41)
Albumin: 3.1 g/dL — ABNORMAL LOW (ref 3.5–5.0)
Alkaline Phosphatase: 72 U/L (ref 38–126)
Anion gap: 8 (ref 5–15)
BUN: 17 mg/dL (ref 8–23)
CO2: 24 mmol/L (ref 22–32)
Calcium: 8.6 mg/dL — ABNORMAL LOW (ref 8.9–10.3)
Chloride: 98 mmol/L (ref 98–111)
Creatinine, Ser: 0.75 mg/dL (ref 0.61–1.24)
GFR calc Af Amer: >60 mL/min (ref >60)
GFR calc non Af Amer: >60 mL/min (ref >60)
Glucose, Bld: 303 mg/dL — ABNORMAL HIGH (ref 70–99)
Potassium: 4 mmol/L (ref 3.5–5.1)
Sodium: 130 mmol/L — ABNORMAL LOW (ref 135–145)
Total Bilirubin: 0.5 mg/dL (ref 0.3–1.2)
Total Protein: 6.3 g/dL — ABNORMAL LOW (ref 6.5–8.1)

## 2019-08-19 LAB — C-REACTIVE PROTEIN: CRP: 12.4 mg/dL — ABNORMAL HIGH (ref <1.0)

## 2019-08-19 LAB — CBG MONITORING, ED
Glucose-Capillary: 233 mg/dL — ABNORMAL HIGH (ref 70–99)
Glucose-Capillary: 230 mg/dL — ABNORMAL HIGH (ref 70–99)
Glucose-Capillary: 270 mg/dL — ABNORMAL HIGH (ref 70–99)
Glucose-Capillary: 342 mg/dL — ABNORMAL HIGH (ref 70–99)

## 2019-08-19 LAB — HEMOGLOBIN A1C
Hgb A1c MFr Bld: 7.6 % — ABNORMAL HIGH (ref 4.8–5.6)
Mean Plasma Glucose: 171.42 mg/dL

## 2019-08-19 LAB — D-DIMER, QUANTITATIVE: D-Dimer, Quant: 0.32 ug/mL-FEU (ref 0.00–0.50)

## 2019-08-19 LAB — MAGNESIUM: Magnesium: 1.7 mg/dL (ref 1.7–2.4)

## 2019-08-19 LAB — FERRITIN: Ferritin: 104 ng/mL (ref 24–336)

## 2019-08-19 LAB — ABO/RH: ABO/RH(D): O POS

## 2019-08-19 LAB — PHOSPHORUS: Phosphorus: 3.8 mg/dL (ref 2.5–4.6)

## 2019-08-19 MED ORDER — ACETAMINOPHEN 325 MG PO TABS
650.0000 mg | ORAL_TABLET | Freq: Four times a day (QID) | ORAL | Status: DC | PRN
  Administered 2019-08-20 – 2019-08-22 (×2): 650 mg via ORAL
  Filled 2019-08-19 (×2): qty 2

## 2019-08-19 MED ORDER — ALBUTEROL SULFATE HFA 108 (90 BASE) MCG/ACT IN AERS
2.0000 | INHALATION_SPRAY | Freq: Four times a day (QID) | RESPIRATORY_TRACT | Status: DC
  Administered 2019-08-19 – 2019-08-20 (×6): 2 via RESPIRATORY_TRACT
  Filled 2019-08-19: qty 6.7

## 2019-08-19 MED ORDER — DILTIAZEM HCL ER COATED BEADS 120 MG PO CP24: 120.0000 mg | ORAL_CAPSULE | Freq: Every day | ORAL | Status: DC

## 2019-08-19 MED ORDER — DEXAMETHASONE SODIUM PHOSPHATE 10 MG/ML IJ SOLN
6.0000 mg | INTRAMUSCULAR | Status: DC
  Administered 2019-08-19 – 2019-08-22 (×4): 6 mg via INTRAVENOUS
  Filled 2019-08-19 (×4): qty 1

## 2019-08-19 MED ORDER — GUAIFENESIN-DM 100-10 MG/5ML PO SYRP
10.0000 mL | ORAL_SOLUTION | ORAL | Status: DC | PRN
  Administered 2019-08-20 – 2019-08-22 (×3): 10 mL via ORAL
  Filled 2019-08-19 (×3): qty 10

## 2019-08-19 MED ORDER — ZINC SULFATE 220 (50 ZN) MG PO CAPS
220.0000 mg | ORAL_CAPSULE | Freq: Every day | ORAL | Status: DC
  Administered 2019-08-19 – 2019-08-22 (×4): 220 mg via ORAL
  Filled 2019-08-19 (×4): qty 1

## 2019-08-19 MED ORDER — MAGNESIUM SULFATE 2 GM/50ML IV SOLN
2.0000 g | Freq: Once | INTRAVENOUS | Status: AC
  Administered 2019-08-19: 2 g via INTRAVENOUS
  Filled 2019-08-19: qty 50

## 2019-08-19 MED ORDER — ONDANSETRON HCL 4 MG PO TABS: 4.0000 mg | ORAL_TABLET | Freq: Four times a day (QID) | ORAL | Status: DC | PRN

## 2019-08-19 MED ORDER — ATORVASTATIN CALCIUM 10 MG PO TABS
10.0000 mg | ORAL_TABLET | Freq: Every day | ORAL | Status: DC
  Administered 2019-08-19 – 2019-08-22 (×4): 10 mg via ORAL
  Filled 2019-08-19 (×4): qty 1

## 2019-08-19 MED ORDER — CARVEDILOL 3.125 MG PO TABS
3.1250 mg | ORAL_TABLET | Freq: Two times a day (BID) | ORAL | Status: DC
  Administered 2019-08-19 – 2019-08-20 (×3): 3.125 mg via ORAL
  Filled 2019-08-19 (×8): qty 1

## 2019-08-19 MED ORDER — INSULIN ASPART 100 UNIT/ML ~~LOC~~ SOLN
0.0000 [IU] | Freq: Every day | SUBCUTANEOUS | Status: DC
  Administered 2019-08-19: 4 [IU] via SUBCUTANEOUS
  Administered 2019-08-20: 2 [IU] via SUBCUTANEOUS
  Administered 2019-08-21: 4 [IU] via SUBCUTANEOUS
  Filled 2019-08-19: qty 1

## 2019-08-19 MED ORDER — POLYETHYLENE GLYCOL 3350 17 G PO PACK: 17.0000 g | PACK | Freq: Every day | ORAL | Status: DC | PRN

## 2019-08-19 MED ORDER — ASCORBIC ACID 500 MG PO TABS
500.0000 mg | ORAL_TABLET | Freq: Every day | ORAL | Status: DC
  Administered 2019-08-19 – 2019-08-22 (×4): 500 mg via ORAL
  Filled 2019-08-19 (×4): qty 1

## 2019-08-19 MED ORDER — ONDANSETRON HCL 4 MG/2ML IJ SOLN: 4.0000 mg | Freq: Four times a day (QID) | INTRAMUSCULAR | Status: DC | PRN

## 2019-08-19 MED ORDER — INSULIN ASPART 100 UNIT/ML ~~LOC~~ SOLN
0.0000 [IU] | Freq: Three times a day (TID) | SUBCUTANEOUS | Status: DC
  Administered 2019-08-19 (×2): 3 [IU] via SUBCUTANEOUS
  Administered 2019-08-19: 5 [IU] via SUBCUTANEOUS
  Administered 2019-08-20: 3 [IU] via SUBCUTANEOUS
  Administered 2019-08-20: 7 [IU] via SUBCUTANEOUS
  Administered 2019-08-20: 5 [IU] via SUBCUTANEOUS
  Administered 2019-08-21: 3 [IU] via SUBCUTANEOUS
  Administered 2019-08-21: 1 [IU] via SUBCUTANEOUS
  Administered 2019-08-21 – 2019-08-22 (×3): 2 [IU] via SUBCUTANEOUS
  Filled 2019-08-19 (×5): qty 1

## 2019-08-19 NOTE — Progress Notes (Signed)
PROGRESS NOTE  LUCERO IDE UJW:[id number] DOB: 1932/09/27 DOA: 08/18/2019 PCP: Glenda Chroman, MD  Brief History:  86 year old male with a history of diabetes mellitus, hypertension, paroxysmal atrial fibrillation, GERD, hypothyroidism status post thyroidectomy, hyperlipidemia presenting with approximately 2-week history of generalized weakness and 1 week history of progressive shortness of breath, coughing, and decreased oral intake.  The patient states that he had a positive Covid test from a pharmacy in [place] on 08/11/2019.  The patient did not seek monoclonal antibody infusion.  The patient began developing fevers, chills and progressive generalized weakness and shortness of breath and nonproductive cough.  As result, he presented for further evaluation.  He denied any headache, neck pain, chest pain, hemoptysis, nausea, vomiting, diarrhea, abdominal pain, dysuria, hematuria.  He has not received the COVID-19 vaccinations. In the emergency department, the patient had temperature up to 101.6 F.  He was hemodynamically stable.  Oxygen saturation was 88% on room air with improvement to 93% on 3 L.  BMP showed a sodium of 130 but was otherwise unremarkable.  LFTs were unremarkable.  WBC 7.9, hemoglobin 10.9, platelets 204,000.  Chest x-ray showed increased interstitial markings and scattered opacities.  The patient was started on IV steroids and remdesivir.  Assessment/Plan: Acute respiratory failure with hypoxia secondary to COVID-19 pneumonia -Continue IV steroids and remdesivir -Currently stable on 3 L nasal cannula -CRP 7.9>> 12.4 -D-dimer 0.50>> 0.32 -Ferritin 84>> 104 -Continue vitamin C and zinc  Paroxysmal atrial fibrillation -Currently in sinus rhythm -Continue rivaroxaban -Continue carvedilol reduced dose  Essential hypertension -Holding amlodipine, hydralazine, benazepril and clonidine secondary to soft blood pressure -Continue carvedilol at lower  dose  Hyperlipidemia -Restart atorvastatin  Diabetes mellitus type 2 -Hemoglobin A1c -NovoLog sliding scale -holding metformin and glimepiride  Hypothyroidism -Restart levothyroxine  Hyponatremia -due to volume depletion -judicious IVF    Status is: Inpatient  Remains inpatient appropriate because:IV treatments appropriate due to intensity of illness or inability to take PO   Dispo: The patient is from: Home              Anticipated d/c is to: Home              Anticipated d/c date is: 2 days              Patient currently is not medically stable to d/c.        Family Communication:  no Family at bedside  Consultants:  none  Code Status:  FULL   DVT Prophylaxis: Xarelto   Procedures: As Listed in Progress Note Above  Antibiotics: None       Subjective: Pt states he still has some sob and cough.  Patient denies fevers, chills, headache, chest pain,  nausea, vomiting, diarrhea, abdominal pain, dysuria, hematuria, hematochezia, and melena.   Objective: Vitals:   08/19/19 0229 08/19/19 0501 08/19/19 0532 08/19/19 0537  BP: 105/66 122/72 118/68   Pulse: 76 78 73   Resp:  (!) 22 19   Temp:    98.9 F (37.2 C)  TempSrc:      SpO2:  92%  93%  Weight:      Height:       No intake or output data in the 24 hours ending 08/19/19 0738 Weight change:  Exam:   General:  Pt is alert, follows commands appropriately, not in acute distress  HEENT: No icterus, No thrush, No neck mass, [place]/AT  Cardiovascular: RRR, S1/S2,  no rubs, no gallops  Respiratory: bibasilar crackles. No wheeze  Abdomen: Soft/+BS, non tender, non distended, no guarding  Extremities: No edema, No lymphangitis, No petechiae, No rashes, no synovitis   Data Reviewed: I have personally reviewed following labs and imaging studies Basic Metabolic Panel: Recent Labs  Lab 08/18/19 1615 08/19/19 0218  NA 131* 130*  K 3.6 4.0  CL 96* 98  CO2 23 24  GLUCOSE 209* 303*  BUN 15  17  CREATININE 0.77 0.75  CALCIUM 9.0 8.6*  MG  --  1.7  PHOS  --  3.8   Liver Function Tests: Recent Labs  Lab 08/18/19 1615 08/19/19 0218  AST 13* 12*  ALT 15 16  ALKPHOS 72 72  BILITOT 0.7 0.5  PROT 6.3* 6.3*  ALBUMIN 3.3* 3.1*   No results for input(s): LIPASE, AMYLASE in the last 168 hours. No results for input(s): AMMONIA in the last 168 hours. Coagulation Profile: No results for input(s): INR, PROTIME in the last 168 hours. CBC: Recent Labs  Lab 08/18/19 1615 08/19/19 0218  WBC 7.9 5.8  NEUTROABS 6.7 5.2  HGB 10.9* 11.1*  HCT 34.5* 34.8*  MCV 83.3 82.9  PLT 264 237   Cardiac Enzymes: No results for input(s): CKTOTAL, CKMB, CKMBINDEX, TROPONINI in the last 168 hours. BNP: Invalid input(s): POCBNP CBG: No results for input(s): GLUCAP in the last 168 hours. HbA1C: No results for input(s): HGBA1C in the last 72 hours. Urine analysis: No results found for: COLORURINE, APPEARANCEUR, LABSPEC, PHURINE, GLUCOSEU, HGBUR, BILIRUBINUR, KETONESUR, PROTEINUR, UROBILINOGEN, NITRITE, LEUKOCYTESUR Sepsis Labs: @LABRCNTIP (procalcitonin:4,lacticidven:4) ) Recent Results (from the past 240 hour(s))  SARS Coronavirus 2 by RT PCR (hospital order, performed in [facility] hospital lab) Nasopharyngeal Nasopharyngeal Swab     Status: Abnormal   Collection Time: 08/18/19  3:40 PM   Specimen: Nasopharyngeal Swab  Result Value Ref Range Status   SARS Coronavirus 2 POSITIVE (A) NEGATIVE Final    Comment: RESULT CALLED TO, READ BACK BY AND VERIFIED WITH: MYRICK,B AT 1747 ON 8.12.21 BY ISLEY,B (NOTE) SARS-CoV-2 target nucleic acids are DETECTED  SARS-CoV-2 RNA is generally detectable in upper respiratory specimens  during the acute phase of infection.  Positive results are indicative  of the presence of the identified virus, but do not rule out bacterial infection or co-infection with other pathogens not detected by the test.  Clinical correlation with patient history and   other diagnostic information is necessary to determine patient infection status.  The expected result is negative.  Fact Sheet for Patients:   StrictlyIdeas.no   Fact Sheet for Healthcare Providers:   BankingDealers.co.za    This test is not yet approved or cleared by the [place] FDA and  has been authorized for detection and/or diagnosis of SARS-CoV-2 by FDA under an Emergency Use Authorization (EUA).  This EUA will remain in effect (meaning thi s test can be used) for the duration of  the COVID-19 declaration under Section 564(b)(1) of the Act, 21 U.S.C. section 360-bbb-3(b)(1), unless the authorization is terminated or revoked sooner.  Performed at [facility], [address], [place], [place] [address]   Blood Culture (routine x 2)     Status: None (Preliminary result)   Collection Time: 08/18/19  4:15 PM   Specimen: BLOOD LEFT FOREARM  Result Value Ref Range Status   Specimen Description BLOOD LEFT FOREARM  Final   Special Requests   Final    BOTTLES DRAWN AEROBIC AND ANAEROBIC Blood Culture adequate volume   Culture  Final    NO GROWTH < 24 HOURS Performed at [facility], [address] [place], [place] [address]    Report Status PENDING  Incomplete  Blood Culture (routine x 2)     Status: None (Preliminary result)   Collection Time: 08/18/19  4:15 PM   Specimen: BLOOD LEFT HAND  Result Value Ref Range Status   Specimen Description BLOOD LEFT HAND  Final   Special Requests   Final    BOTTLES DRAWN AEROBIC AND ANAEROBIC Blood Culture adequate volume   Culture   Final    NO GROWTH < 24 HOURS Performed at [facility], [address], [place], [place] [address]    Report Status PENDING  Incomplete     Scheduled Meds: . albuterol  2 puff Inhalation Q6H  . vitamin C  500 mg Oral Daily  . dexamethasone (DECADRON) injection  6 mg Intravenous Q24H  . diltiazem  120 mg Oral Daily  . insulin aspart  0-5 Units  Subcutaneous QHS  . insulin aspart  0-9 Units Subcutaneous TID WC  . rivaroxaban  20 mg Oral Daily  . zinc sulfate  220 mg Oral Daily   Continuous Infusions: . remdesivir 100 mg in NS 100 mL      Procedures/Studies: DG Chest Port 1 View  Result Date: 08/18/2019 CLINICAL DATA:  Shortness of breath EXAM: PORTABLE CHEST 1 VIEW COMPARISON:  Radiograph 01/16/2019 FINDINGS: Diminished lung volumes with some likely increasing vascular congestion as well as mixed hazy and patchy opacities towards the lung bases. No pneumothorax or visible effusion. The aorta is calcified. The remaining cardiomediastinal contours are unremarkable. Surgical clips noted at the base of the neck. Degenerative changes are present in the imaged spine and shoulders. Evidence of prior bilateral rotator cuff repairs. Telemetry leads overlie the chest. IMPRESSION: Diminished lung volumes with some likely increasing vascular congestion as well as mixed hazy and patchy opacities towards the lung bases, could reflect developing infection and/or edema. Electronically Signed   By: Lovena Le M.D.   On: 08/18/2019 16:08    Orson Eva, DO  [facility]  If 7PM-7AM, please contact night-coverage [email or web address] Password [facility] 08/19/2019, 7:38 AM   LOS: 1 day

## 2019-08-19 NOTE — ED Notes (Signed)
Pt given decaf coffee, milk, sandwich tray, toothbrush and toothpaste per request.

## 2019-08-19 NOTE — ED Notes (Signed)
Pt O2 noted to be out of his nose. [place] reapplied.

## 2019-08-19 NOTE — ED Notes (Signed)
Pt given water per request. Lights dimmed for pt comfort.

## 2019-08-19 NOTE — ED Notes (Signed)
CBG 230 will not upload into epic.

## 2019-08-20 ENCOUNTER — Other Ambulatory Visit: Payer: Self-pay

## 2019-08-20 LAB — CBC WITH DIFFERENTIAL/PLATELET
Abs Immature Granulocytes: 0.14 10*3/uL — ABNORMAL HIGH (ref 0.00–0.07)
Basophils Absolute: 0 10*3/uL (ref 0.0–0.1)
Basophils Relative: 0 %
Eosinophils Absolute: 0 10*3/uL (ref 0.0–0.5)
Eosinophils Relative: 0 %
HCT: 37.8 % — ABNORMAL LOW (ref 39.0–52.0)
Hemoglobin: 12 g/dL — ABNORMAL LOW (ref 13.0–17.0)
Immature Granulocytes: 2 %
Lymphocytes Relative: 5 %
Lymphs Abs: 0.5 10*3/uL — ABNORMAL LOW (ref 0.7–4.0)
MCH: 26.1 pg (ref 26.0–34.0)
MCHC: 31.7 g/dL (ref 30.0–36.0)
MCV: 82.4 fL (ref 80.0–100.0)
Monocytes Absolute: 0.6 10*3/uL (ref 0.1–1.0)
Monocytes Relative: 7 %
Neutro Abs: 7.9 10*3/uL — ABNORMAL HIGH (ref 1.7–7.7)
Neutrophils Relative %: 86 %
Platelets: 284 10*3/uL (ref 150–400)
RBC: 4.59 MIL/uL (ref 4.22–5.81)
RDW: 14.6 % (ref 11.5–15.5)
WBC: 9.1 10*3/uL (ref 4.0–10.5)
nRBC: 0 % (ref 0.0–0.2)

## 2019-08-20 LAB — COMPREHENSIVE METABOLIC PANEL
ALT: 16 U/L (ref 0–44)
AST: 14 U/L — ABNORMAL LOW (ref 15–41)
Albumin: 3 g/dL — ABNORMAL LOW (ref 3.5–5.0)
Alkaline Phosphatase: 65 U/L (ref 38–126)
Anion gap: 10 (ref 5–15)
BUN: 19 mg/dL (ref 8–23)
CO2: 26 mmol/L (ref 22–32)
Calcium: 9.1 mg/dL (ref 8.9–10.3)
Chloride: 100 mmol/L (ref 98–111)
Creatinine, Ser: 0.69 mg/dL (ref 0.61–1.24)
GFR calc Af Amer: >60 mL/min (ref >60)
GFR calc non Af Amer: >60 mL/min (ref >60)
Glucose, Bld: 211 mg/dL — ABNORMAL HIGH (ref 70–99)
Potassium: 4.3 mmol/L (ref 3.5–5.1)
Sodium: 136 mmol/L (ref 135–145)
Total Bilirubin: 0.4 mg/dL (ref 0.3–1.2)
Total Protein: 6.1 g/dL — ABNORMAL LOW (ref 6.5–8.1)

## 2019-08-20 LAB — GLUCOSE, CAPILLARY
Glucose-Capillary: 202 mg/dL — ABNORMAL HIGH (ref 70–99)
Glucose-Capillary: 255 mg/dL — ABNORMAL HIGH (ref 70–99)
Glucose-Capillary: 327 mg/dL — ABNORMAL HIGH (ref 70–99)
Glucose-Capillary: 215 mg/dL — ABNORMAL HIGH (ref 70–99)

## 2019-08-20 LAB — PHOSPHORUS: Phosphorus: 2.3 mg/dL — ABNORMAL LOW (ref 2.5–4.6)

## 2019-08-20 LAB — C-REACTIVE PROTEIN: CRP: 8.4 mg/dL — ABNORMAL HIGH (ref <1.0)

## 2019-08-20 LAB — FERRITIN: Ferritin: 137 ng/mL (ref 24–336)

## 2019-08-20 LAB — D-DIMER, QUANTITATIVE: D-Dimer, Quant: 0.41 ug/mL-FEU (ref 0.00–0.50)

## 2019-08-20 LAB — MAGNESIUM: Magnesium: 2 mg/dL (ref 1.7–2.4)

## 2019-08-20 MED ORDER — CLONAZEPAM 0.5 MG PO TABS
1.0000 mg | ORAL_TABLET | Freq: Once | ORAL | Status: AC
  Administered 2019-08-20: 1 mg via ORAL
  Filled 2019-08-20: qty 2

## 2019-08-20 MED ORDER — INSULIN ASPART 100 UNIT/ML ~~LOC~~ SOLN
5.0000 [IU] | Freq: Three times a day (TID) | SUBCUTANEOUS | Status: DC
  Administered 2019-08-20 – 2019-08-22 (×6): 5 [IU] via SUBCUTANEOUS

## 2019-08-20 MED ORDER — CARVEDILOL 3.125 MG PO TABS
6.2500 mg | ORAL_TABLET | Freq: Two times a day (BID) | ORAL | Status: DC
  Administered 2019-08-20 – 2019-08-21 (×2): 6.25 mg via ORAL
  Filled 2019-08-20 (×2): qty 2

## 2019-08-20 MED ORDER — ALBUTEROL SULFATE HFA 108 (90 BASE) MCG/ACT IN AERS
2.0000 | INHALATION_SPRAY | Freq: Three times a day (TID) | RESPIRATORY_TRACT | Status: DC
  Administered 2019-08-21 – 2019-08-22 (×5): 2 via RESPIRATORY_TRACT

## 2019-08-20 NOTE — Progress Notes (Signed)
Patient scheduled for outpatient Remdesivir infusions at 1pm on Sunday 8/15 and Monday 8/16 at [facility]. Please inform the patient to park at [place], as staff will be escorting the patient through the [place] entrance of the hospital.    There is a wave flag banner located near the entrance on [address]. Turn into this entrance and immediately turn left and park in 1 of the 5 designated Covid Infusion Parking spots. There is a phone number on the sign, please call and let the staff know what spot you are in and we will come out and get you. For questions call [phone].  Thanks.  * If patient is getting dropped off, you may have your ride pull up to the Main Entrance of [facility]. Please stay in the car and call [phone], staff will meet you at your car and escort you into the hospital and back to the clinic.

## 2019-08-20 NOTE — Plan of Care (Signed)

## 2019-08-20 NOTE — Progress Notes (Signed)
PROGRESS NOTE  Brett Matthews ZGY:[id number] DOB: 06-09-1932 DOA: 08/18/2019 PCP: Glenda Chroman, MD   Brief History:  86 year old male with a history of diabetes mellitus, hypertension, paroxysmal atrial fibrillation, GERD, hypothyroidism status post thyroidectomy, hyperlipidemia presenting with approximately 2-week history of generalized weakness and 1 week history of progressive shortness of breath, coughing, and decreased oral intake.  The patient states that he had a positive Covid test from a pharmacy in [place] on 08/11/2019.  The patient did not seek monoclonal antibody infusion.  The patient began developing fevers, chills and progressive generalized weakness and shortness of breath and nonproductive cough.  As result, he presented for further evaluation.  He denied any headache, neck pain, chest pain, hemoptysis, nausea, vomiting, diarrhea, abdominal pain, dysuria, hematuria.  He has not received the COVID-19 vaccinations. In the emergency department, the patient had temperature up to 101.6 F.  He was hemodynamically stable.  Oxygen saturation was 88% on room air with improvement to 93% on 3 L.  BMP showed a sodium of 130 but was otherwise unremarkable.  LFTs were unremarkable.  WBC 7.9, hemoglobin 10.9, platelets 204,000.  Chest x-ray showed increased interstitial markings and scattered opacities.  The patient was started on IV steroids and remdesivir.  Assessment/Plan: Acute respiratory failure with hypoxia secondary to COVID-19 pneumonia -Continue IV steroids and remdesivir -Currently stable on 3 L nasal cannula>>>1L -CRP 7.9>> 12.4>>8.4 -D-dimer 0.50>> 0.32>>0.41 -Ferritin 84>> 104>>137 -Continue vitamin C and zinc -unfortunately, he does not have transportation to get to outpt transfusion clinic for remdesivir  Paroxysmal atrial fibrillation -Currently in sinus rhythm -Continue rivaroxaban -Continue carvedilol reduced dose  Essential  hypertension -Holding amlodipine, hydralazine, benazepril and clonidine secondary to soft blood pressure -Continue carvedilol at lower dose-->gradually increase dose  Hyperlipidemia -Restart atorvastatin  Diabetes mellitus type 2 -Hemoglobin A1c--7.6 -NovoLog sliding scale -holding metformin and glimepiride -add novolog with meals  Hypothyroidism -Restart levothyroxine  Hyponatremia -due to volume depletion -judicious IVF    Status is: Inpatient  Remains inpatient appropriate because:IV treatments appropriate due to intensity of illness or inability to take PO   Dispo: The patient is from: Home  Anticipated d/c is to: Home  Anticipated d/c date is: 2 days  Patient currently is not medically stable to d/c.        Family Communication:  no Family at bedside  Consultants:  none  Code Status:  FULL   DVT Prophylaxis: Xarelto   Procedures: As Listed in Progress Note Above  Antibiotics: None   Total time spent 35 minutes.  Greater than 50% spent face to face counseling and coordinating care.    Subjective: Patient denies fevers, chills, headache, chest pain, dyspnea, nausea, vomiting, diarrhea, abdominal pain, dysuria, hematuria, hematochezia, and melena.   Objective: Vitals:   08/20/19 1003 08/20/19 1100 08/20/19 1214 08/20/19 1324  BP: (!) 141/107 (!) 145/71    Pulse: 86 73 75   Resp: 18 (!) 24 15   Temp:    98.4 F (36.9 C)  TempSrc:    Oral  SpO2: 91% 93% 99% 93%  Weight:      Height:       No intake or output data in the 24 hours ending 08/20/19 1524 Weight change:  Exam:   General:  Pt is alert, follows commands appropriately, not in acute distress  HEENT: No icterus, No thrush, No neck mass, [place]/AT  Cardiovascular: RRR, S1/S2, no rubs, no gallops  Respiratory: bibasilar  crackles. No wheeze  Abdomen: Soft/+BS, non tender, non distended, no guarding  Extremities: No  edema, No lymphangitis, No petechiae, No rashes, no synovitis   Data Reviewed: I have personally reviewed following labs and imaging studies Basic Metabolic Panel: Recent Labs  Lab 08/18/19 1615 08/19/19 0218 08/20/19 0505  NA 131* 130* 136  K 3.6 4.0 4.3  CL 96* 98 100  CO2 23 24 26   GLUCOSE 209* 303* 211*  BUN 15 17 19   CREATININE 0.77 0.75 0.69  CALCIUM 9.0 8.6* 9.1  MG  --  1.7 2.0  PHOS  --  3.8 2.3*   Liver Function Tests: Recent Labs  Lab 08/18/19 1615 08/19/19 0218 08/20/19 0505  AST 13* 12* 14*  ALT 15 16 16   ALKPHOS 72 72 65  BILITOT 0.7 0.5 0.4  PROT 6.3* 6.3* 6.1*  ALBUMIN 3.3* 3.1* 3.0*   No results for input(s): LIPASE, AMYLASE in the last 168 hours. No results for input(s): AMMONIA in the last 168 hours. Coagulation Profile: No results for input(s): INR, PROTIME in the last 168 hours. CBC: Recent Labs  Lab 08/18/19 1615 08/19/19 0218 08/20/19 0505  WBC 7.9 5.8 9.1  NEUTROABS 6.7 5.2 7.9*  HGB 10.9* 11.1* 12.0*  HCT 34.5* 34.8* 37.8*  MCV 83.3 82.9 82.4  PLT 264 237 284   Cardiac Enzymes: No results for input(s): CKTOTAL, CKMB, CKMBINDEX, TROPONINI in the last 168 hours. BNP: Invalid input(s): POCBNP CBG: Recent Labs  Lab 08/19/19 0924 08/19/19 1129 08/19/19 1743 08/19/19 2216  GLUCAP 233* 230* 270* 342*   HbA1C: Recent Labs    08/18/19 1629  HGBA1C 7.6*   Urine analysis: No results found for: COLORURINE, APPEARANCEUR, LABSPEC, PHURINE, GLUCOSEU, HGBUR, BILIRUBINUR, KETONESUR, PROTEINUR, UROBILINOGEN, NITRITE, LEUKOCYTESUR Sepsis Labs: @LABRCNTIP (procalcitonin:4,lacticidven:4) ) Recent Results (from the past 240 hour(s))  SARS Coronavirus 2 by RT PCR (hospital order, performed in [facility] hospital lab) Nasopharyngeal Nasopharyngeal Swab     Status: Abnormal   Collection Time: 08/18/19  3:40 PM   Specimen: Nasopharyngeal Swab  Result Value Ref Range Status   SARS Coronavirus 2 POSITIVE (A) NEGATIVE Final    Comment:  RESULT CALLED TO, READ BACK BY AND VERIFIED WITH: MYRICK,B AT 1747 ON 8.12.21 BY ISLEY,B (NOTE) SARS-CoV-2 target nucleic acids are DETECTED  SARS-CoV-2 RNA is generally detectable in upper respiratory specimens  during the acute phase of infection.  Positive results are indicative  of the presence of the identified virus, but do not rule out bacterial infection or co-infection with other pathogens not detected by the test.  Clinical correlation with patient history and  other diagnostic information is necessary to determine patient infection status.  The expected result is negative.  Fact Sheet for Patients:   StrictlyIdeas.no   Fact Sheet for Healthcare Providers:   BankingDealers.co.za    This test is not yet approved or cleared by the [place] FDA and  has been authorized for detection and/or diagnosis of SARS-CoV-2 by FDA under an Emergency Use Authorization (EUA).  This EUA will remain in effect (meaning thi s test can be used) for the duration of  the COVID-19 declaration under Section 564(b)(1) of the Act, 21 U.S.C. section 360-bbb-3(b)(1), unless the authorization is terminated or revoked sooner.  Performed at [facility], [address] [place], [place] [address]   Blood Culture (routine x 2)     Status: None (Preliminary result)   Collection Time: 08/18/19  4:15 PM   Specimen: BLOOD LEFT FOREARM  Result Value Ref  Range Status   Specimen Description BLOOD LEFT FOREARM  Final   Special Requests   Final    BOTTLES DRAWN AEROBIC AND ANAEROBIC Blood Culture adequate volume   Culture   Final    NO GROWTH 2 DAYS Performed at [facility], [address], [place], [place] [address]    Report Status PENDING  Incomplete  Blood Culture (routine x 2)     Status: None (Preliminary result)   Collection Time: 08/18/19  4:15 PM   Specimen: BLOOD LEFT HAND  Result Value Ref Range Status   Specimen Description BLOOD LEFT HAND   Final   Special Requests   Final    BOTTLES DRAWN AEROBIC AND ANAEROBIC Blood Culture adequate volume   Culture   Final    NO GROWTH 2 DAYS Performed at [facility], [address] [place], [place] [address]    Report Status PENDING  Incomplete     Scheduled Meds: . albuterol  2 puff Inhalation Q6H  . vitamin C  500 mg Oral Daily  . atorvastatin  10 mg Oral Daily  . carvedilol  3.125 mg Oral BID WC  . dexamethasone (DECADRON) injection  6 mg Intravenous Q24H  . insulin aspart  0-5 Units Subcutaneous QHS  . insulin aspart  0-9 Units Subcutaneous TID WC  . rivaroxaban  20 mg Oral Daily  . zinc sulfate  220 mg Oral Daily   Continuous Infusions: . remdesivir 100 mg in NS 100 mL 100 mg (08/20/19 1001)    Procedures/Studies: DG Chest Port 1 View  Result Date: 08/18/2019 CLINICAL DATA:  Shortness of breath EXAM: PORTABLE CHEST 1 VIEW COMPARISON:  Radiograph 01/16/2019 FINDINGS: Diminished lung volumes with some likely increasing vascular congestion as well as mixed hazy and patchy opacities towards the lung bases. No pneumothorax or visible effusion. The aorta is calcified. The remaining cardiomediastinal contours are unremarkable. Surgical clips noted at the base of the neck. Degenerative changes are present in the imaged spine and shoulders. Evidence of prior bilateral rotator cuff repairs. Telemetry leads overlie the chest. IMPRESSION: Diminished lung volumes with some likely increasing vascular congestion as well as mixed hazy and patchy opacities towards the lung bases, could reflect developing infection and/or edema. Electronically Signed   By: Lovena Le M.D.   On: 08/18/2019 16:08    Orson Eva, DO  [facility]  If 7PM-7AM, please contact night-coverage [email or web address] Password [email or web address] 08/20/2019, 3:24 PM   LOS: 2 days

## 2019-08-20 NOTE — ED Notes (Signed)
CBG 202. RN notified.

## 2019-08-20 NOTE — Discharge Instructions (Signed)
Patient scheduled for outpatient Remdesivir infusions at 1pm on Sunday 8/15 and Monday 8/16 at [facility]. Please inform the patient to park at [place], as staff will be escorting the patient through the [place] entrance of the hospital.    There is a wave flag banner located near the entrance on [address]. Turn into this entrance and immediately turn left and park in 1 of the 5 designated Covid Infusion Parking spots. There is a phone number on the sign, please call and let the staff know what spot you are in and we will come out and get you. For questions call [phone].  Thanks.  * If patient is getting dropped off, you may have your ride pull up to the Main Entrance of [facility] located at [address] [place], [place]. Please stay in the car and call [phone], staff will meet you at your car and escort you into the hospital and back to the clinic.

## 2019-08-21 ENCOUNTER — Ambulatory Visit (HOSPITAL_COMMUNITY): Payer: PPO

## 2019-08-21 LAB — CBC WITH DIFFERENTIAL/PLATELET
Abs Immature Granulocytes: 0.08 10*3/uL — ABNORMAL HIGH (ref 0.00–0.07)
Basophils Absolute: 0 10*3/uL (ref 0.0–0.1)
Basophils Relative: 0 %
Eosinophils Absolute: 0 10*3/uL (ref 0.0–0.5)
Eosinophils Relative: 0 %
HCT: 35.3 % — ABNORMAL LOW (ref 39.0–52.0)
Hemoglobin: 11.2 g/dL — ABNORMAL LOW (ref 13.0–17.0)
Immature Granulocytes: 1 %
Lymphocytes Relative: 8 %
Lymphs Abs: 0.5 10*3/uL — ABNORMAL LOW (ref 0.7–4.0)
MCH: 26 pg (ref 26.0–34.0)
MCHC: 31.7 g/dL (ref 30.0–36.0)
MCV: 81.9 fL (ref 80.0–100.0)
Monocytes Absolute: 0.4 10*3/uL (ref 0.1–1.0)
Monocytes Relative: 6 %
Neutro Abs: 5.5 10*3/uL (ref 1.7–7.7)
Neutrophils Relative %: 85 %
Platelets: 307 10*3/uL (ref 150–400)
RBC: 4.31 MIL/uL (ref 4.22–5.81)
RDW: 14.5 % (ref 11.5–15.5)
WBC: 6.6 10*3/uL (ref 4.0–10.5)
nRBC: 0 % (ref 0.0–0.2)

## 2019-08-21 LAB — GLUCOSE, CAPILLARY
Glucose-Capillary: 172 mg/dL — ABNORMAL HIGH (ref 70–99)
Glucose-Capillary: 146 mg/dL — ABNORMAL HIGH (ref 70–99)
Glucose-Capillary: 238 mg/dL — ABNORMAL HIGH (ref 70–99)
Glucose-Capillary: 302 mg/dL — ABNORMAL HIGH (ref 70–99)

## 2019-08-21 LAB — COMPREHENSIVE METABOLIC PANEL
ALT: 15 U/L (ref 0–44)
AST: 13 U/L — ABNORMAL LOW (ref 15–41)
Albumin: 2.8 g/dL — ABNORMAL LOW (ref 3.5–5.0)
Alkaline Phosphatase: 64 U/L (ref 38–126)
Anion gap: 9 (ref 5–15)
BUN: 17 mg/dL (ref 8–23)
CO2: 24 mmol/L (ref 22–32)
Calcium: 8.8 mg/dL — ABNORMAL LOW (ref 8.9–10.3)
Chloride: 100 mmol/L (ref 98–111)
Creatinine, Ser: 0.58 mg/dL — ABNORMAL LOW (ref 0.61–1.24)
GFR calc Af Amer: >60 mL/min (ref >60)
GFR calc non Af Amer: >60 mL/min (ref >60)
Glucose, Bld: 188 mg/dL — ABNORMAL HIGH (ref 70–99)
Potassium: 3.7 mmol/L (ref 3.5–5.1)
Sodium: 133 mmol/L — ABNORMAL LOW (ref 135–145)
Total Bilirubin: 0.4 mg/dL (ref 0.3–1.2)
Total Protein: 5.5 g/dL — ABNORMAL LOW (ref 6.5–8.1)

## 2019-08-21 LAB — C-REACTIVE PROTEIN: CRP: 4.8 mg/dL — ABNORMAL HIGH (ref <1.0)

## 2019-08-21 LAB — MAGNESIUM: Magnesium: 1.9 mg/dL (ref 1.7–2.4)

## 2019-08-21 LAB — PHOSPHORUS: Phosphorus: 2.2 mg/dL — ABNORMAL LOW (ref 2.5–4.6)

## 2019-08-21 LAB — FERRITIN: Ferritin: 130 ng/mL (ref 24–336)

## 2019-08-21 LAB — D-DIMER, QUANTITATIVE: D-Dimer, Quant: 0.32 ug/mL-FEU (ref 0.00–0.50)

## 2019-08-21 MED ORDER — CARVEDILOL 12.5 MG PO TABS
12.5000 mg | ORAL_TABLET | Freq: Two times a day (BID) | ORAL | Status: DC
  Administered 2019-08-21 – 2019-08-22 (×2): 12.5 mg via ORAL
  Filled 2019-08-21 (×2): qty 1

## 2019-08-21 MED ORDER — ASCORBIC ACID 500 MG PO TABS: 500.0000 mg | ORAL_TABLET | Freq: Every day | ORAL | Status: DC

## 2019-08-21 MED ORDER — CLONAZEPAM 0.5 MG PO TABS
1.0000 mg | ORAL_TABLET | Freq: Once | ORAL | Status: AC
  Administered 2019-08-21: 1 mg via ORAL
  Filled 2019-08-21: qty 2

## 2019-08-21 MED ORDER — ZINC SULFATE 220 (50 ZN) MG PO CAPS: 220.0000 mg | ORAL_CAPSULE | Freq: Every day | ORAL | Status: DC

## 2019-08-21 MED ORDER — K PHOS MONO-SOD PHOS DI & MONO 155-852-130 MG PO TABS
500.0000 mg | ORAL_TABLET | Freq: Two times a day (BID) | ORAL | Status: DC
  Administered 2019-08-21 – 2019-08-22 (×3): 500 mg via ORAL
  Filled 2019-08-21 (×3): qty 2

## 2019-08-21 NOTE — Discharge Summary (Signed)
Physician Discharge Summary  Brett Matthews LMB:[id number] DOB: 23-Jan-1932 DOA: 08/18/2019  PCP: Glenda Chroman, MD  Admit date: 08/18/2019 Discharge date: 08/22/19  Admitted From: Home Disposition:  Home   Recommendations for Outpatient Follow-up:  1. Follow up with PCP in 1-2 weeks 2. Please obtain BMP/CBC in one week 3. Follow up with PCP to determine need for continued oxygen   Equipment/Devices: 2L [place]  Discharge Condition: Stable CODE STATUS: FULL Diet recommendation: Heart Healthy / Carb Modified   Brief/Interim Summary: 86 year old male with a history of diabetes mellitus, hypertension, paroxysmal atrial fibrillation, GERD, hypothyroidism status post thyroidectomy, hyperlipidemia presenting with approximately 2-week history of generalized weakness and 1 week history of progressive shortness of breath, coughing, and decreased oral intake. The patient states that he had a positive Covid test from a pharmacy in [place] on 08/11/2019. The patient did not seek monoclonal antibody infusion. The patient began developing fevers, chills and progressive generalized weakness and shortness of breath and nonproductive cough. As result, he presented for further evaluation. He denied any headache, neck pain, chest pain, hemoptysis, nausea, vomiting, diarrhea, abdominal pain, dysuria, hematuria. He has not received the COVID-19 vaccinations. In the emergency department, the patient had temperature up to 101.6 F. He was hemodynamically stable. Oxygen saturation was 88% on room air with improvement to 93% on 3 L. BMP showed a sodium of 130 but was otherwise unremarkable. LFTs were unremarkable. WBC 7.9, hemoglobin 10.9, platelets 204,000. Chest x-ray showed increased interstitial markings and scattered opacities. The patient was started on IV steroids and remdesivir.  He improved clinically, and he was weaned off of oxygen.  Discharge Diagnoses:  Acute respiratory failure  with hypoxia secondary to COVID-19 pneumonia -Continue IV steroids and remdesivir -Currently stable on 3 L nasal cannula>>>1L>>RA -CRP 7.9>>12.4>>8.4>>4.8>>3.6 -D-dimer 0.50>>0.32>>0.41>>0.32>>0.27 -Ferritin 84>>104>>137>>130>>104 -Continue vitamin C and zinc -unfortunately, he does not have transportation to get to outpt transfusion clinic for remdesivir -finished 5 days remdesivir in hospital -d/c home with 5 more days steroids  Paroxysmal atrial fibrillation -Currently in sinus rhythm -Continue rivaroxaban -Continue carvedilol--increased back to home dose  Essential hypertension -Holding amlodipine, hydralazine, benazepril and clonidine secondary to soft blood pressure -restart above after d/c as BP now up again -increase coreg dose back to home dose  Hyperlipidemia -Restart atorvastatin  Diabetes mellitus type 2 -Hemoglobin A1c--7.6 -NovoLog sliding scale -holding metformin and glimepiride--resatart after d/c -add novolog with meals  Hypothyroidism -Restart levothyroxine  Hyponatremia -due to volume depletion -judicious IVF   Hypophosphatemia -replete   Discharge Instructions   Allergies as of 08/22/2019   No Known Allergies     Medication List    STOP taking these medications   clindamycin 300 MG capsule Commonly known as: CLEOCIN   diltiazem 240 MG 24 hr capsule Commonly known as: CARDIZEM CD   hydrALAZINE 25 MG tablet Commonly known as: APRESOLINE   Hydromet 5-1.5 MG/5ML syrup Generic drug: HYDROcodone-homatropine   predniSONE 10 MG tablet Commonly known as: DELTASONE     TAKE these medications   amLODipine 10 MG tablet Commonly known as: NORVASC Take 10 mg by mouth daily.   ascorbic acid 500 MG tablet Commonly known as: VITAMIN C Take 1 tablet (500 mg total) by mouth daily.   atorvastatin 10 MG tablet Commonly known as: LIPITOR TAKE 1 TABLET(10 MG) BY MOUTH DAILY   benazepril 40 MG tablet Commonly known as:  LOTENSIN Take 0.5 tablets (20 mg total) by mouth daily. What changed:   how much to take  additional instructions   carvedilol 12.5 MG tablet Commonly known as: COREG Take 12.5 mg by mouth 2 (two) times daily with a meal. Take 1 and 1/2 tablets BID   citalopram 20 MG tablet Commonly known as: CELEXA Take 1 tablet by mouth daily.   clonazePAM 1 MG tablet Commonly known as: KLONOPIN Take 1 mg by mouth at bedtime. For sleep   dexamethasone 6 MG tablet Commonly known as: DECADRON Take 1 tablet (6 mg total) by mouth daily. Start taking on: August 23, 2019   docusate sodium 100 MG capsule Commonly known as: COLACE Take 100 mg by mouth daily as needed for mild constipation.   folic acid 1 MG tablet Commonly known as: FOLVITE Take 1 tablet by mouth daily.   glimepiride 2 MG tablet Commonly known as: AMARYL Take 2 mg by mouth 2 (two) times daily.   isosorbide dinitrate 30 MG tablet Commonly known as: ISORDIL Take 1 tablet by mouth 3 (three) times daily.   Linzess 290 MCG Caps capsule Generic drug: linaclotide Take 1 capsule by mouth daily as needed.   metFORMIN 500 MG tablet Commonly known as: GLUCOPHAGE Take 2 tabs (1,000mg ) by mouth every morning & 1 tab (500mg ) every evening   pantoprazole 40 MG tablet Commonly known as: PROTONIX Take 40 mg by mouth daily.   rivaroxaban 20 MG Tabs tablet Commonly known as: XARELTO Take 20 mg by mouth daily with supper.   Synthroid 125 MCG tablet Generic drug: levothyroxine Take 125 mcg by mouth daily before breakfast. SYNTHROID - brand name only   tamsulosin 0.4 MG Caps capsule Commonly known as: FLOMAX Take 0.4 mg by mouth daily after supper.   zinc sulfate 220 (50 Zn) MG capsule Take 1 capsule (220 mg total) by mouth daily.            Durable Medical Equipment  (From admission, onward)         Start     Ordered   08/22/19 1019  For home use only DME oxygen  Once       Question Answer Comment  Length of Need  6 Months   Mode or (Route) Nasal cannula   Liters per Minute 2   Frequency Continuous (stationary and portable oxygen unit needed)   Oxygen conserving device Yes   Oxygen delivery system Gas      08/22/19 1018          No Known Allergies  Consultations:  none   Procedures/Studies: DG Chest Port 1 View  Result Date: 08/18/2019 CLINICAL DATA:  Shortness of breath EXAM: PORTABLE CHEST 1 VIEW COMPARISON:  Radiograph 01/16/2019 FINDINGS: Diminished lung volumes with some likely increasing vascular congestion as well as mixed hazy and patchy opacities towards the lung bases. No pneumothorax or visible effusion. The aorta is calcified. The remaining cardiomediastinal contours are unremarkable. Surgical clips noted at the base of the neck. Degenerative changes are present in the imaged spine and shoulders. Evidence of prior bilateral rotator cuff repairs. Telemetry leads overlie the chest. IMPRESSION: Diminished lung volumes with some likely increasing vascular congestion as well as mixed hazy and patchy opacities towards the lung bases, could reflect developing infection and/or edema. Electronically Signed   By: Lovena Le M.D.   On: 08/18/2019 16:08        Discharge Exam: Vitals:   08/22/19 0849 08/22/19 0906  BP:  (!) 171/85  Pulse:  62  Resp:  16  Temp:  98.1 F (36.7 C)  SpO2: 99% 94%  Vitals:   08/21/19 2130 08/22/19 0657 08/22/19 0849 08/22/19 0906  BP:  (!) 170/76  (!) 171/85  Pulse: 74 60  62  Resp: 18 20  16   Temp:  98.5 F (36.9 C)  98.1 F (36.7 C)  TempSrc:  Oral  Oral  SpO2: 98% 96% 99% 94%  Weight:      Height:        General: Pt is alert, awake, not in acute distress Cardiovascular: RRR, S1/S2 +, no rubs, no gallops Respiratory: bibasilar crackles. No wheeze Abdominal: Soft, NT, ND, bowel sounds + Extremities: no edema, no cyanosis   The results of significant diagnostics from this hospitalization (including imaging, microbiology, ancillary and  laboratory) are listed below for reference.    Significant Diagnostic Studies: DG Chest Port 1 View  Result Date: 08/18/2019 CLINICAL DATA:  Shortness of breath EXAM: PORTABLE CHEST 1 VIEW COMPARISON:  Radiograph 01/16/2019 FINDINGS: Diminished lung volumes with some likely increasing vascular congestion as well as mixed hazy and patchy opacities towards the lung bases. No pneumothorax or visible effusion. The aorta is calcified. The remaining cardiomediastinal contours are unremarkable. Surgical clips noted at the base of the neck. Degenerative changes are present in the imaged spine and shoulders. Evidence of prior bilateral rotator cuff repairs. Telemetry leads overlie the chest. IMPRESSION: Diminished lung volumes with some likely increasing vascular congestion as well as mixed hazy and patchy opacities towards the lung bases, could reflect developing infection and/or edema. Electronically Signed   By: Lovena Le M.D.   On: 08/18/2019 16:08     Microbiology: Recent Results (from the past 240 hour(s))  SARS Coronavirus 2 by RT PCR (hospital order, performed in [facility] hospital lab) Nasopharyngeal Nasopharyngeal Swab     Status: Abnormal   Collection Time: 08/18/19  3:40 PM   Specimen: Nasopharyngeal Swab  Result Value Ref Range Status   SARS Coronavirus 2 POSITIVE (A) NEGATIVE Final    Comment: RESULT CALLED TO, READ BACK BY AND VERIFIED WITH: MYRICK,B AT 1747 ON 8.12.21 BY ISLEY,B (NOTE) SARS-CoV-2 target nucleic acids are DETECTED  SARS-CoV-2 RNA is generally detectable in upper respiratory specimens  during the acute phase of infection.  Positive results are indicative  of the presence of the identified virus, but do not rule out bacterial infection or co-infection with other pathogens not detected by the test.  Clinical correlation with patient history and  other diagnostic information is necessary to determine patient infection status.  The expected result is  negative.  Fact Sheet for Patients:   StrictlyIdeas.no   Fact Sheet for Healthcare Providers:   BankingDealers.co.za    This test is not yet approved or cleared by the [place] FDA and  has been authorized for detection and/or diagnosis of SARS-CoV-2 by FDA under an Emergency Use Authorization (EUA).  This EUA will remain in effect (meaning thi s test can be used) for the duration of  the COVID-19 declaration under Section 564(b)(1) of the Act, 21 U.S.C. section 360-bbb-3(b)(1), unless the authorization is terminated or revoked sooner.  Performed at [facility], [address], [place], [place] [address]   Blood Culture (routine x 2)     Status: None (Preliminary result)   Collection Time: 08/18/19  4:15 PM   Specimen: BLOOD LEFT FOREARM  Result Value Ref Range Status   Specimen Description BLOOD LEFT FOREARM  Final   Special Requests   Final    BOTTLES DRAWN AEROBIC AND ANAEROBIC Blood Culture adequate volume   Culture  Final    NO GROWTH 4 DAYS Performed at [facility], [address] [place], [place] [address]    Report Status PENDING  Incomplete  Blood Culture (routine x 2)     Status: None (Preliminary result)   Collection Time: 08/18/19  4:15 PM   Specimen: BLOOD LEFT HAND  Result Value Ref Range Status   Specimen Description BLOOD LEFT HAND  Final   Special Requests   Final    BOTTLES DRAWN AEROBIC AND ANAEROBIC Blood Culture adequate volume   Culture   Final    NO GROWTH 4 DAYS Performed at [facility], [address], [place], [place] [address]    Report Status PENDING  Incomplete     Labs: Basic Metabolic Panel: Recent Labs  Lab 08/18/19 1615 08/18/19 1615 08/19/19 0218 08/19/19 0218 08/20/19 0505 08/20/19 0505 08/21/19 0651 08/22/19 0642  NA 131*  --  130*  --  136  --  133* 134*  K 3.6   < > 4.0   < > 4.3   < > 3.7 3.7  CL 96*  --  98  --  100  --  100 101  CO2 23  --  24  --  26  --  24 26   GLUCOSE 209*  --  303*  --  211*  --  188* 214*  BUN 15  --  17  --  19  --  17 18  CREATININE 0.77  --  0.75  --  0.69  --  0.58* 0.67  CALCIUM 9.0  --  8.6*  --  9.1  --  8.8* 8.8*  MG  --   --  1.7  --  2.0  --  1.9 2.0  PHOS  --   --  3.8  --  2.3*  --  2.2* 2.9   < > = values in this interval not displayed.   Liver Function Tests: Recent Labs  Lab 08/18/19 1615 08/19/19 0218 08/20/19 0505 08/21/19 0651 08/22/19 0642  AST 13* 12* 14* 13* 14*  ALT 15 16 16 15 16   ALKPHOS 72 72 65 64 65  BILITOT 0.7 0.5 0.4 0.4 0.5  PROT 6.3* 6.3* 6.1* 5.5* 5.4*  ALBUMIN 3.3* 3.1* 3.0* 2.8* 2.7*   No results for input(s): LIPASE, AMYLASE in the last 168 hours. No results for input(s): AMMONIA in the last 168 hours. CBC: Recent Labs  Lab 08/18/19 1615 08/19/19 0218 08/20/19 0505 08/21/19 0651 08/22/19 0642  WBC 7.9 5.8 9.1 6.6 6.7  NEUTROABS 6.7 5.2 7.9* 5.5 5.5  HGB 10.9* 11.1* 12.0* 11.2* 11.1*  HCT 34.5* 34.8* 37.8* 35.3* 35.2*  MCV 83.3 82.9 82.4 81.9 81.9  PLT 264 237 284 307 296   Cardiac Enzymes: No results for input(s): CKTOTAL, CKMB, CKMBINDEX, TROPONINI in the last 168 hours. BNP: Invalid input(s): POCBNP CBG: Recent Labs  Lab 08/21/19 0845 08/21/19 1208 08/21/19 1719 08/21/19 2026 08/22/19 0846  GLUCAP 172* 146* 238* 302* 184*    Time coordinating discharge:  36 minutes  Signed:  Orson Eva, DO [facility] Pager: [phone] 08/22/2019, 10:20 AM

## 2019-08-21 NOTE — Progress Notes (Addendum)
PROGRESS NOTE  Brett Matthews HQI:[id number] DOB: 05/17/1932 DOA: 08/18/2019 PCP: Glenda Chroman, MD   Brief History: 86 year old male with a history of diabetes mellitus, hypertension, paroxysmal atrial fibrillation, GERD, hypothyroidism status post thyroidectomy, hyperlipidemia presenting with approximately 2-week history of generalized weakness and 1 week history of progressive shortness of breath, coughing, and decreased oral intake. The patient states that he had a positive Covid test from a pharmacy in [place] on 08/11/2019. The patient did not seek monoclonal antibody infusion. The patient began developing fevers, chills and progressive generalized weakness and shortness of breath and nonproductive cough. As result, he presented for further evaluation. He denied any headache, neck pain, chest pain, hemoptysis, nausea, vomiting, diarrhea, abdominal pain, dysuria, hematuria. He has not received the COVID-19 vaccinations. In the emergency department, the patient had temperature up to 101.6 F. He was hemodynamically stable. Oxygen saturation was 88% on room air with improvement to 93% on 3 L. BMP showed a sodium of 130 but was otherwise unremarkable. LFTs were unremarkable. WBC 7.9, hemoglobin 10.9, platelets 204,000. Chest x-ray showed increased interstitial markings and scattered opacities. The patient was started on IV steroids and remdesivir.  Assessment/Plan: Acute respiratory failure with hypoxia secondary to COVID-19 pneumonia -Continue IV steroids and remdesivir -Currently stable on 3 L nasal cannula>>>1L>>RA -CRP 7.9>>12.4>>8.4>>4.8 -D-dimer 0.50>>0.32>>0.41>>0.32 -Ferritin 84>>104>>137>>130 -Continue vitamin C and zinc -unfortunately, he does not have transportation to get to outpt transfusion clinic for remdesivir  Paroxysmal atrial fibrillation -Currently in sinus rhythm -Continue rivaroxaban -Continue carvedilol reduced dose  Essential  hypertension -Holding amlodipine, hydralazine, benazepril and clonidine secondary to soft blood pressure -increase coreg dose back to home dose  Hyperlipidemia -Restart atorvastatin  Diabetes mellitus type 2 -Hemoglobin A1c--7.6 -NovoLog sliding scale -holding metformin and glimepiride -add novolog with meals  Hypothyroidism -Restart levothyroxine  Hyponatremia -due to volume depletion -judicious IVF   Hypophosphatemia -replete   Status is: Inpatient  Remains inpatient appropriate because:IV treatments appropriate due to intensity of illness or inability to take PO   Dispo: The patient is from:Home Anticipated d/c is XL:[facility] Anticipated d/c date is: 2 days Patient currently is not medically stable to d/c.        Family Communication:noFamily at bedside  Consultants:none  Code Status: FULL   DVT Prophylaxis:Xarelto   Procedures: As Listed in Progress Note Above  Antibiotics: None     Subjective: Patient denies fevers, chills, headache, chest pain, dyspnea, nausea, vomiting, diarrhea, abdominal pain, dysuria, hematuria, hematochezia, and melena.   Objective: Vitals:   08/21/19 0130 08/21/19 0558 08/21/19 0810 08/21/19 0911  BP: (!) 150/95 (!) 180/89  140/87  Pulse: 68 70  80  Resp: 18 16  18   Temp: 98.2 F (36.8 C) 97.8 F (36.6 C)  99 F (37.2 C)  TempSrc: Oral Oral  Oral  SpO2: 95% 94% 93% 91%  Weight:      Height:        Intake/Output Summary (Last 24 hours) at 08/21/2019 1601 Last data filed at 08/20/2019 2120 Gross per 24 hour  Intake 390 ml  Output --  Net 390 ml   Weight change:  Exam:   General:  Pt is alert, follows commands appropriately, not in acute distress  HEENT: No icterus, No thrush, No neck mass, [employer]/AT  Cardiovascular: RRR, S1/S2, no rubs, no gallops  Respiratory: bibasilar crackles. No wheeze  Abdomen: Soft/+BS, non tender, non  distended, no guarding  Extremities: No edema, No lymphangitis,  No petechiae, No rashes, no synovitis   Data Reviewed: I have personally reviewed following labs and imaging studies Basic Metabolic Panel: Recent Labs  Lab 08/18/19 1615 08/19/19 0218 08/20/19 0505 08/21/19 0651  NA 131* 130* 136 133*  K 3.6 4.0 4.3 3.7  CL 96* 98 100 100  CO2 23 24 26 24   GLUCOSE 209* 303* 211* 188*  BUN 15 17 19 17   CREATININE 0.77 0.75 0.69 0.58*  CALCIUM 9.0 8.6* 9.1 8.8*  MG  --  1.7 2.0 1.9  PHOS  --  3.8 2.3* 2.2*   Liver Function Tests: Recent Labs  Lab 08/18/19 1615 08/19/19 0218 08/20/19 0505 08/21/19 0651  AST 13* 12* 14* 13*  ALT 15 16 16 15   ALKPHOS 72 72 65 64  BILITOT 0.7 0.5 0.4 0.4  PROT 6.3* 6.3* 6.1* 5.5*  ALBUMIN 3.3* 3.1* 3.0* 2.8*   No results for input(s): LIPASE, AMYLASE in the last 168 hours. No results for input(s): AMMONIA in the last 168 hours. Coagulation Profile: No results for input(s): INR, PROTIME in the last 168 hours. CBC: Recent Labs  Lab 08/18/19 1615 08/19/19 0218 08/20/19 0505 08/21/19 0651  WBC 7.9 5.8 9.1 6.6  NEUTROABS 6.7 5.2 7.9* 5.5  HGB 10.9* 11.1* 12.0* 11.2*  HCT 34.5* 34.8* 37.8* 35.3*  MCV 83.3 82.9 82.4 81.9  PLT 264 237 284 307   Cardiac Enzymes: No results for input(s): CKTOTAL, CKMB, CKMBINDEX, TROPONINI in the last 168 hours. BNP: Invalid input(s): POCBNP CBG: Recent Labs  Lab 08/20/19 1232 08/20/19 1604 08/20/19 2106 08/21/19 0845 08/21/19 1208  GLUCAP 255* 327* 215* 172* 146*   HbA1C: Recent Labs    08/18/19 1629  HGBA1C 7.6*   Urine analysis: No results found for: COLORURINE, APPEARANCEUR, LABSPEC, PHURINE, GLUCOSEU, HGBUR, BILIRUBINUR, KETONESUR, PROTEINUR, UROBILINOGEN, NITRITE, LEUKOCYTESUR Sepsis Labs: @LABRCNTIP (procalcitonin:4,lacticidven:4) ) Recent Results (from the past 240 hour(s))  SARS Coronavirus 2 by RT PCR (hospital order, performed in [facility] hospital lab) Nasopharyngeal  Nasopharyngeal Swab     Status: Abnormal   Collection Time: 08/18/19  3:40 PM   Specimen: Nasopharyngeal Swab  Result Value Ref Range Status   SARS Coronavirus 2 POSITIVE (A) NEGATIVE Final    Comment: RESULT CALLED TO, READ BACK BY AND VERIFIED WITH: MYRICK,B AT 1747 ON 8.12.21 BY ISLEY,B (NOTE) SARS-CoV-2 target nucleic acids are DETECTED  SARS-CoV-2 RNA is generally detectable in upper respiratory specimens  during the acute phase of infection.  Positive results are indicative  of the presence of the identified virus, but do not rule out bacterial infection or co-infection with other pathogens not detected by the test.  Clinical correlation with patient history and  other diagnostic information is necessary to determine patient infection status.  The expected result is negative.  Fact Sheet for Patients:   StrictlyIdeas.no   Fact Sheet for Healthcare Providers:   BankingDealers.co.za    This test is not yet approved or cleared by the [place] FDA and  has been authorized for detection and/or diagnosis of SARS-CoV-2 by FDA under an Emergency Use Authorization (EUA).  This EUA will remain in effect (meaning thi s test can be used) for the duration of  the COVID-19 declaration under Section 564(b)(1) of the Act, 21 U.S.C. section 360-bbb-3(b)(1), unless the authorization is terminated or revoked sooner.  Performed at [facility], [address] [place], [place] [address]   Blood Culture (routine x 2)     Status: None (Preliminary result)   Collection Time: 08/18/19  4:15 PM   Specimen: BLOOD LEFT FOREARM  Result Value Ref Range Status   Specimen Description BLOOD LEFT FOREARM  Final   Special Requests   Final    BOTTLES DRAWN AEROBIC AND ANAEROBIC Blood Culture adequate volume   Culture   Final    NO GROWTH 2 DAYS Performed at [facility], [address] [place], [place] [address]    Report Status PENDING  Incomplete    Blood Culture (routine x 2)     Status: None (Preliminary result)   Collection Time: 08/18/19  4:15 PM   Specimen: BLOOD LEFT HAND  Result Value Ref Range Status   Specimen Description BLOOD LEFT HAND  Final   Special Requests   Final    BOTTLES DRAWN AEROBIC AND ANAEROBIC Blood Culture adequate volume   Culture   Final    NO GROWTH 2 DAYS Performed at [facility], [address] [place], [place] [address]    Report Status PENDING  Incomplete     Scheduled Meds: . albuterol  2 puff Inhalation TID  . vitamin C  500 mg Oral Daily  . atorvastatin  10 mg Oral Daily  . carvedilol  12.5 mg Oral BID WC  . dexamethasone (DECADRON) injection  6 mg Intravenous Q24H  . insulin aspart  0-5 Units Subcutaneous QHS  . insulin aspart  0-9 Units Subcutaneous TID WC  . insulin aspart  5 Units Subcutaneous TID WC  . phosphorus  500 mg Oral BID  . rivaroxaban  20 mg Oral Daily  . zinc sulfate  220 mg Oral Daily   Continuous Infusions: . remdesivir 100 mg in NS 100 mL 100 mg (08/21/19 0925)    Procedures/Studies: DG Chest Port 1 View  Result Date: 08/18/2019 CLINICAL DATA:  Shortness of breath EXAM: PORTABLE CHEST 1 VIEW COMPARISON:  Radiograph 01/16/2019 FINDINGS: Diminished lung volumes with some likely increasing vascular congestion as well as mixed hazy and patchy opacities towards the lung bases. No pneumothorax or visible effusion. The aorta is calcified. The remaining cardiomediastinal contours are unremarkable. Surgical clips noted at the base of the neck. Degenerative changes are present in the imaged spine and shoulders. Evidence of prior bilateral rotator cuff repairs. Telemetry leads overlie the chest. IMPRESSION: Diminished lung volumes with some likely increasing vascular congestion as well as mixed hazy and patchy opacities towards the lung bases, could reflect developing infection and/or edema. Electronically Signed   By: Lovena Le M.D.   On: 08/18/2019 16:08    Orson Eva,  DO  [facility]  If 7PM-7AM, please contact night-coverage [email or web address] Password [email or web address] 08/21/2019, 4:01 PM   LOS: 3 days

## 2019-08-22 ENCOUNTER — Ambulatory Visit (HOSPITAL_COMMUNITY): Payer: PPO

## 2019-08-22 LAB — CBC WITH DIFFERENTIAL/PLATELET
Abs Immature Granulocytes: 0.08 10*3/uL — ABNORMAL HIGH (ref 0.00–0.07)
Basophils Absolute: 0 10*3/uL (ref 0.0–0.1)
Basophils Relative: 0 %
Eosinophils Absolute: 0 10*3/uL (ref 0.0–0.5)
Eosinophils Relative: 0 %
HCT: 35.2 % — ABNORMAL LOW (ref 39.0–52.0)
Hemoglobin: 11.1 g/dL — ABNORMAL LOW (ref 13.0–17.0)
Immature Granulocytes: 1 %
Lymphocytes Relative: 8 %
Lymphs Abs: 0.6 10*3/uL — ABNORMAL LOW (ref 0.7–4.0)
MCH: 25.8 pg — ABNORMAL LOW (ref 26.0–34.0)
MCHC: 31.5 g/dL (ref 30.0–36.0)
MCV: 81.9 fL (ref 80.0–100.0)
Monocytes Absolute: 0.5 10*3/uL (ref 0.1–1.0)
Monocytes Relative: 7 %
Neutro Abs: 5.5 10*3/uL (ref 1.7–7.7)
Neutrophils Relative %: 84 %
Platelets: 296 10*3/uL (ref 150–400)
RBC: 4.3 MIL/uL (ref 4.22–5.81)
RDW: 14.6 % (ref 11.5–15.5)
WBC: 6.7 10*3/uL (ref 4.0–10.5)
nRBC: 0 % (ref 0.0–0.2)

## 2019-08-22 LAB — COMPREHENSIVE METABOLIC PANEL
ALT: 16 U/L (ref 0–44)
AST: 14 U/L — ABNORMAL LOW (ref 15–41)
Albumin: 2.7 g/dL — ABNORMAL LOW (ref 3.5–5.0)
Alkaline Phosphatase: 65 U/L (ref 38–126)
Anion gap: 7 (ref 5–15)
BUN: 18 mg/dL (ref 8–23)
CO2: 26 mmol/L (ref 22–32)
Calcium: 8.8 mg/dL — ABNORMAL LOW (ref 8.9–10.3)
Chloride: 101 mmol/L (ref 98–111)
Creatinine, Ser: 0.67 mg/dL (ref 0.61–1.24)
GFR calc Af Amer: >60 mL/min (ref >60)
GFR calc non Af Amer: >60 mL/min (ref >60)
Glucose, Bld: 214 mg/dL — ABNORMAL HIGH (ref 70–99)
Potassium: 3.7 mmol/L (ref 3.5–5.1)
Sodium: 134 mmol/L — ABNORMAL LOW (ref 135–145)
Total Bilirubin: 0.5 mg/dL (ref 0.3–1.2)
Total Protein: 5.4 g/dL — ABNORMAL LOW (ref 6.5–8.1)

## 2019-08-22 LAB — GLUCOSE, CAPILLARY
Glucose-Capillary: 184 mg/dL — ABNORMAL HIGH (ref 70–99)
Glucose-Capillary: 176 mg/dL — ABNORMAL HIGH (ref 70–99)

## 2019-08-22 LAB — D-DIMER, QUANTITATIVE: D-Dimer, Quant: <0.27 ug/mL-FEU (ref 0.00–0.50)

## 2019-08-22 LAB — C-REACTIVE PROTEIN: CRP: 3.6 mg/dL — ABNORMAL HIGH (ref <1.0)

## 2019-08-22 LAB — PHOSPHORUS: Phosphorus: 2.9 mg/dL (ref 2.5–4.6)

## 2019-08-22 LAB — FERRITIN: Ferritin: 104 ng/mL (ref 24–336)

## 2019-08-22 LAB — MAGNESIUM: Magnesium: 2 mg/dL (ref 1.7–2.4)

## 2019-08-22 MED ORDER — DEXAMETHASONE 6 MG PO TABS: 6.0000 mg | ORAL_TABLET | Freq: Every day | ORAL | 0 refills | Status: DC

## 2019-08-22 MED ORDER — DEXAMETHASONE 4 MG PO TABS: 6.0000 mg | ORAL_TABLET | Freq: Every day | ORAL | Status: DC

## 2019-08-22 MED ORDER — MELATONIN 3 MG PO TABS
3.0000 mg | ORAL_TABLET | Freq: Once | ORAL | Status: AC
  Administered 2019-08-22: 3 mg via ORAL
  Filled 2019-08-22 (×3): qty 1

## 2019-08-22 NOTE — Care Management Important Message (Signed)
Important Message  Patient Details  Name: Brett Matthews MRN: [id number] Date of Birth: [birth year]   Medicare Important Message Given:  Yes - Important Message mailed due to current National Emergency     Tommy Medal 08/22/2019, 1:30 PM

## 2019-08-22 NOTE — Progress Notes (Signed)
SATURATION QUALIFICATIONS: (This note is used to comply with regulatory documentation for home oxygen)  Patient Saturations on Room Air at Rest = 91  Patient Saturations on Room Air while Ambulating = 88  Patient Saturations on 2 Liters of oxygen while Ambulating = 94%  Please briefly explain why patient needs home oxygen: To maintain 02 sat at 90% or above during ambulation.   Orson Eva, DO

## 2019-08-22 NOTE — Progress Notes (Signed)
Nsg Discharge Note  Admit Date:  08/18/2019 Discharge date: 08/22/2019   [name] to be D/C'd Home per MD order.  AVS completed.  Copy for chart, and copy for patient signed, and dated. Patient/caregiver able to verbalize understanding. Patients IVs taken out. Gave discharge summary and reviewed with patient. Showed patient how to use home oxygen equipment and the importance of wearing oxygen.   Discharge Medication: Allergies as of 08/22/2019   No Known Allergies     Medication List    STOP taking these medications   clindamycin 300 MG capsule Commonly known as: CLEOCIN   diltiazem 240 MG 24 hr capsule Commonly known as: CARDIZEM CD   hydrALAZINE 25 MG tablet Commonly known as: APRESOLINE   Hydromet 5-1.5 MG/5ML syrup Generic drug: HYDROcodone-homatropine   predniSONE 10 MG tablet Commonly known as: DELTASONE     TAKE these medications   amLODipine 10 MG tablet Commonly known as: NORVASC Take 10 mg by mouth daily.   ascorbic acid 500 MG tablet Commonly known as: VITAMIN C Take 1 tablet (500 mg total) by mouth daily.   atorvastatin 10 MG tablet Commonly known as: LIPITOR TAKE 1 TABLET(10 MG) BY MOUTH DAILY   benazepril 40 MG tablet Commonly known as: LOTENSIN Take 0.5 tablets (20 mg total) by mouth daily. What changed:   how much to take  additional instructions   carvedilol 12.5 MG tablet Commonly known as: COREG Take 12.5 mg by mouth 2 (two) times daily with a meal. Take 1 and 1/2 tablets BID   citalopram 20 MG tablet Commonly known as: CELEXA Take 1 tablet by mouth daily.   clonazePAM 1 MG tablet Commonly known as: KLONOPIN Take 1 mg by mouth at bedtime. For sleep   dexamethasone 6 MG tablet Commonly known as: DECADRON Take 1 tablet (6 mg total) by mouth daily. Start taking on: August 23, 2019   docusate sodium 100 MG capsule Commonly known as: COLACE Take 100 mg by mouth daily as needed for mild constipation.   folic acid 1 MG  tablet Commonly known as: FOLVITE Take 1 tablet by mouth daily.   glimepiride 2 MG tablet Commonly known as: AMARYL Take 2 mg by mouth 2 (two) times daily.   isosorbide dinitrate 30 MG tablet Commonly known as: ISORDIL Take 1 tablet by mouth 3 (three) times daily.   Linzess 290 MCG Caps capsule Generic drug: linaclotide Take 1 capsule by mouth daily as needed.   metFORMIN 500 MG tablet Commonly known as: GLUCOPHAGE Take 2 tabs (1,000mg ) by mouth every morning & 1 tab (500mg ) every evening   pantoprazole 40 MG tablet Commonly known as: PROTONIX Take 40 mg by mouth daily.   rivaroxaban 20 MG Tabs tablet Commonly known as: XARELTO Take 20 mg by mouth daily with supper.   Synthroid 125 MCG tablet Generic drug: levothyroxine Take 125 mcg by mouth daily before breakfast. SYNTHROID - brand name only   tamsulosin 0.4 MG Caps capsule Commonly known as: FLOMAX Take 0.4 mg by mouth daily after supper.   zinc sulfate 220 (50 Zn) MG capsule Take 1 capsule (220 mg total) by mouth daily.            Durable Medical Equipment  (From admission, onward)         Start     Ordered   08/22/19 1019  For home use only DME oxygen  Once       Question Answer Comment  Length of Need 6 Months  Mode or (Route) Nasal cannula   Liters per Minute 2   Frequency Continuous (stationary and portable oxygen unit needed)   Oxygen conserving device Yes   Oxygen delivery system Gas      08/22/19 1018          Discharge Assessment: Vitals:   08/22/19 0849 08/22/19 0906  BP:  (!) 171/85  Pulse:  62  Resp:  16  Temp:  98.1 F (36.7 C)  SpO2: 99% 94%   Skin clean, dry and intact without evidence of skin break down, no evidence of skin tears noted. IV catheter discontinued intact. Site without signs and symptoms of complications - no redness or edema noted at insertion site, patient denies c/o pain - only slight tenderness at site.  Dressing with slight pressure applied.  D/c  Instructions-Education: Discharge instructions given to patient/family with verbalized understanding. D/c education completed with patient/family including follow up instructions, medication list, d/c activities limitations if indicated, with other d/c instructions as indicated by MD - patient able to verbalize understanding, all questions fully answered. Patient instructed to return to ED, call 911, or call MD for any changes in condition.  Patient escorted via [place], and D/C home via private auto.  Zenaida Deed, RN 08/22/2019 2:54 PM

## 2019-08-22 NOTE — TOC Transition Note (Signed)
Transition of Care [facility]) - CM/SW Discharge Note   Patient Details  Name: Brett Matthews MRN: [id number] Date of Birth: 13-Aug-1932  Transition of Care [facility]) CM/SW Contact:  Shade Flood, LCSW Phone Number: 08/22/2019, 10:34 AM   Clinical Narrative:     Pt stable for dc per MD. Pt with orders for Home O2. Referred out and awaiting delivery which will be around 1330 today. Updated pt's RN who will update patient.  There are no other TOC needs identified for dc.   Barriers to Discharge: Barriers Resolved   Patient Goals and CMS Choice        Expected Discharge Plan and Services   In-house Referral: Clinical Social Work     Living arrangements for the past 2 months: Single Family Home Expected Discharge Date: 08/22/19               DME Arranged: Oxygen DME Agency: [employer] Date DME Agency Contacted: 08/22/19 Time DME Agency Contacted: [phone] Representative spoke with at DME Agency: Barbaraann Rondo            Prior Living Arrangements/Services Living arrangements for the past 2 months: [place]   Patient language and need for interpreter reviewed:: Yes              Criminal Activity/Legal Involvement Pertinent to Current Situation/Hospitalization: No - Comment as needed  Activities of Daily Living Home Assistive Devices/Equipment: Eyeglasses, Hearing aid, CBG Meter ADL Screening (condition at time of admission) Patient's cognitive ability adequate to safely complete daily activities?: No (Forgetful at times) Is the patient deaf or have difficulty hearing?: Yes Does the patient have difficulty seeing, even when wearing glasses/contacts?: No Does the patient have difficulty concentrating, remembering, or making decisions?: Yes Patient able to express need for assistance with ADLs?: Yes Does the patient have difficulty dressing or bathing?: No Independently performs ADLs?: Yes (appropriate for developmental age) Does the patient have difficulty walking or  climbing stairs?: Yes Weakness of Legs: Both Weakness of Arms/Hands: None  Permission Sought/Granted                  Emotional Assessment         Alcohol / Substance Use: Not Applicable Psych Involvement: No (comment)  Admission diagnosis:  Hypoxia [R09.02] Pneumonia due to COVID-19 virus [U07.1, J12.82] COVID-19 [U07.1] Patient Active Problem List   Diagnosis Date Noted  . Acute respiratory failure with hypoxia ([place]) 08/19/2019  . Pneumonia due to COVID-19 virus 08/18/2019  . Chest pain of uncertain etiology 11/91/4782  . H/O hemorrhoidectomy   . Bleeding hemorrhoids 03/24/2017  . Abdominal pain 03/02/2017  . Anemia 03/02/2017  . Rectal bleeding 03/17/2013  . Dyslipidemia 07/24/2010  . CAD, NATIVE VESSEL 09/26/2008  . Hypothyroidism 08/06/2007  . Type 2 diabetes mellitus without complication, without long-term current use of insulin ([place]) 08/06/2007  . Essential hypertension, benign 08/06/2007  . GASTROESOPHAGEAL REFLUX DISEASE, CHRONIC 08/06/2007  . Paroxysmal atrial fibrillation ([place]) 08/06/2007   PCP:  Glenda Chroman, MD Pharmacy:   [facility] [place], [place] [address] [place] [address] [place] [address] Phone: [phone] Fax: [place], [place] [address] [place] [address] Phone: [phone] Fax: [phone]     Social Determinants of Health (SDOH) Interventions    Readmission Risk Interventions No flowsheet data found.  Final next level of care: Home/Self Care Barriers to Discharge: Barriers Resolved   Patient Goals and CMS Choice        Discharge Placement                       Discharge Plan and Services In-house Referral: Clinical Social Work              DME Arranged: Oxygen DME Agency: [employer] Date DME Agency Contacted: 08/22/19 Time DME Agency Contacted: [phone] Representative spoke  with at DME Agency: [place] ([place]) Interventions     Readmission Risk Interventions No flowsheet data found.

## 2019-08-24 LAB — CULTURE, BLOOD (ROUTINE X 2)
Culture: NO GROWTH
Culture: NO GROWTH
Special Requests: ADEQUATE
Special Requests: ADEQUATE

## 2019-08-26 DIAGNOSIS — U071 COVID-19: Secondary | ICD-10-CM | POA: Diagnosis not present

## 2019-08-26 DIAGNOSIS — J9601 Acute respiratory failure with hypoxia: Secondary | ICD-10-CM | POA: Diagnosis not present

## 2019-08-26 DIAGNOSIS — E1142 Type 2 diabetes mellitus with diabetic polyneuropathy: Secondary | ICD-10-CM | POA: Diagnosis not present

## 2019-08-26 DIAGNOSIS — Z09 Encounter for follow-up examination after completed treatment for conditions other than malignant neoplasm: Secondary | ICD-10-CM | POA: Diagnosis not present

## 2019-08-26 DIAGNOSIS — I10 Essential (primary) hypertension: Secondary | ICD-10-CM | POA: Diagnosis not present

## 2019-08-26 DIAGNOSIS — Z299 Encounter for prophylactic measures, unspecified: Secondary | ICD-10-CM | POA: Diagnosis not present

## 2019-08-26 DIAGNOSIS — J1282 Pneumonia due to coronavirus disease 2019: Secondary | ICD-10-CM | POA: Diagnosis not present

## 2019-08-28 ENCOUNTER — Other Ambulatory Visit: Payer: Self-pay | Admitting: Cardiology

## 2019-09-02 DIAGNOSIS — B029 Zoster without complications: Secondary | ICD-10-CM | POA: Diagnosis not present

## 2019-09-02 DIAGNOSIS — U071 COVID-19: Secondary | ICD-10-CM | POA: Diagnosis not present

## 2019-09-02 DIAGNOSIS — E1165 Type 2 diabetes mellitus with hyperglycemia: Secondary | ICD-10-CM | POA: Diagnosis not present

## 2019-09-02 DIAGNOSIS — Z299 Encounter for prophylactic measures, unspecified: Secondary | ICD-10-CM | POA: Diagnosis not present

## 2019-09-02 DIAGNOSIS — F322 Major depressive disorder, single episode, severe without psychotic features: Secondary | ICD-10-CM | POA: Diagnosis not present

## 2019-09-05 DIAGNOSIS — Z299 Encounter for prophylactic measures, unspecified: Secondary | ICD-10-CM | POA: Diagnosis not present

## 2019-09-05 DIAGNOSIS — B029 Zoster without complications: Secondary | ICD-10-CM | POA: Diagnosis not present

## 2019-09-05 DIAGNOSIS — I10 Essential (primary) hypertension: Secondary | ICD-10-CM | POA: Diagnosis not present

## 2019-09-05 DIAGNOSIS — R600 Localized edema: Secondary | ICD-10-CM | POA: Diagnosis not present

## 2019-09-07 DIAGNOSIS — F332 Major depressive disorder, recurrent severe without psychotic features: Secondary | ICD-10-CM | POA: Diagnosis not present

## 2019-09-07 DIAGNOSIS — Z1331 Encounter for screening for depression: Secondary | ICD-10-CM | POA: Diagnosis not present

## 2019-09-07 DIAGNOSIS — Z299 Encounter for prophylactic measures, unspecified: Secondary | ICD-10-CM | POA: Diagnosis not present

## 2019-09-07 DIAGNOSIS — I739 Peripheral vascular disease, unspecified: Secondary | ICD-10-CM | POA: Diagnosis not present

## 2019-09-07 DIAGNOSIS — I10 Essential (primary) hypertension: Secondary | ICD-10-CM | POA: Diagnosis not present

## 2019-09-16 DIAGNOSIS — L98499 Non-pressure chronic ulcer of skin of other sites with unspecified severity: Secondary | ICD-10-CM | POA: Diagnosis not present

## 2019-09-16 DIAGNOSIS — Z299 Encounter for prophylactic measures, unspecified: Secondary | ICD-10-CM | POA: Diagnosis not present

## 2019-09-16 DIAGNOSIS — F332 Major depressive disorder, recurrent severe without psychotic features: Secondary | ICD-10-CM | POA: Diagnosis not present

## 2019-09-16 DIAGNOSIS — E1165 Type 2 diabetes mellitus with hyperglycemia: Secondary | ICD-10-CM | POA: Diagnosis not present

## 2019-09-16 DIAGNOSIS — I10 Essential (primary) hypertension: Secondary | ICD-10-CM | POA: Diagnosis not present

## 2019-09-19 DIAGNOSIS — B0229 Other postherpetic nervous system involvement: Secondary | ICD-10-CM | POA: Diagnosis not present

## 2019-09-19 DIAGNOSIS — Z299 Encounter for prophylactic measures, unspecified: Secondary | ICD-10-CM | POA: Diagnosis not present

## 2019-09-19 DIAGNOSIS — M21372 Foot drop, left foot: Secondary | ICD-10-CM | POA: Diagnosis not present

## 2019-09-19 DIAGNOSIS — I10 Essential (primary) hypertension: Secondary | ICD-10-CM | POA: Diagnosis not present

## 2019-09-19 DIAGNOSIS — E1165 Type 2 diabetes mellitus with hyperglycemia: Secondary | ICD-10-CM | POA: Diagnosis not present

## 2019-09-23 DIAGNOSIS — Z299 Encounter for prophylactic measures, unspecified: Secondary | ICD-10-CM | POA: Diagnosis not present

## 2019-09-23 DIAGNOSIS — E519 Thiamine deficiency, unspecified: Secondary | ICD-10-CM | POA: Diagnosis not present

## 2019-09-23 DIAGNOSIS — E1165 Type 2 diabetes mellitus with hyperglycemia: Secondary | ICD-10-CM | POA: Diagnosis not present

## 2019-09-23 DIAGNOSIS — R05 Cough: Secondary | ICD-10-CM | POA: Diagnosis not present

## 2019-09-23 DIAGNOSIS — I4891 Unspecified atrial fibrillation: Secondary | ICD-10-CM | POA: Diagnosis not present

## 2019-09-23 DIAGNOSIS — M792 Neuralgia and neuritis, unspecified: Secondary | ICD-10-CM | POA: Diagnosis not present

## 2019-09-23 DIAGNOSIS — I10 Essential (primary) hypertension: Secondary | ICD-10-CM | POA: Diagnosis not present

## 2019-09-26 DIAGNOSIS — J9601 Acute respiratory failure with hypoxia: Secondary | ICD-10-CM | POA: Diagnosis not present

## 2019-09-26 DIAGNOSIS — U071 COVID-19: Secondary | ICD-10-CM | POA: Diagnosis not present

## 2019-09-26 DIAGNOSIS — J1282 Pneumonia due to coronavirus disease 2019: Secondary | ICD-10-CM | POA: Diagnosis not present

## 2019-09-30 ENCOUNTER — Other Ambulatory Visit: Payer: Self-pay | Admitting: Cardiology

## 2019-10-02 DIAGNOSIS — M25462 Effusion, left knee: Secondary | ICD-10-CM | POA: Diagnosis not present

## 2019-10-02 DIAGNOSIS — W19XXXA Unspecified fall, initial encounter: Secondary | ICD-10-CM | POA: Diagnosis not present

## 2019-10-02 DIAGNOSIS — Z87891 Personal history of nicotine dependence: Secondary | ICD-10-CM | POA: Diagnosis not present

## 2019-10-02 DIAGNOSIS — Z823 Family history of stroke: Secondary | ICD-10-CM | POA: Diagnosis not present

## 2019-10-02 DIAGNOSIS — R600 Localized edema: Secondary | ICD-10-CM | POA: Diagnosis not present

## 2019-10-02 DIAGNOSIS — E785 Hyperlipidemia, unspecified: Secondary | ICD-10-CM | POA: Diagnosis not present

## 2019-10-02 DIAGNOSIS — I4891 Unspecified atrial fibrillation: Secondary | ICD-10-CM | POA: Diagnosis not present

## 2019-10-02 DIAGNOSIS — I10 Essential (primary) hypertension: Secondary | ICD-10-CM | POA: Diagnosis not present

## 2019-10-02 DIAGNOSIS — F419 Anxiety disorder, unspecified: Secondary | ICD-10-CM | POA: Diagnosis not present

## 2019-10-02 DIAGNOSIS — S8002XA Contusion of left knee, initial encounter: Secondary | ICD-10-CM | POA: Diagnosis not present

## 2019-10-02 DIAGNOSIS — M2548 Effusion, other site: Secondary | ICD-10-CM | POA: Diagnosis not present

## 2019-10-02 DIAGNOSIS — E119 Type 2 diabetes mellitus without complications: Secondary | ICD-10-CM | POA: Diagnosis not present

## 2019-10-02 DIAGNOSIS — M1712 Unilateral primary osteoarthritis, left knee: Secondary | ICD-10-CM | POA: Diagnosis not present

## 2019-10-06 DIAGNOSIS — H903 Sensorineural hearing loss, bilateral: Secondary | ICD-10-CM | POA: Diagnosis not present

## 2019-10-06 DIAGNOSIS — E7849 Other hyperlipidemia: Secondary | ICD-10-CM | POA: Diagnosis not present

## 2019-10-06 DIAGNOSIS — I10 Essential (primary) hypertension: Secondary | ICD-10-CM | POA: Diagnosis not present

## 2019-10-06 DIAGNOSIS — E1165 Type 2 diabetes mellitus with hyperglycemia: Secondary | ICD-10-CM | POA: Diagnosis not present

## 2019-10-06 DIAGNOSIS — M170 Bilateral primary osteoarthritis of knee: Secondary | ICD-10-CM | POA: Diagnosis not present

## 2019-10-12 DIAGNOSIS — Z299 Encounter for prophylactic measures, unspecified: Secondary | ICD-10-CM | POA: Diagnosis not present

## 2019-10-12 DIAGNOSIS — H938X3 Other specified disorders of ear, bilateral: Secondary | ICD-10-CM | POA: Diagnosis not present

## 2019-10-12 DIAGNOSIS — I10 Essential (primary) hypertension: Secondary | ICD-10-CM | POA: Diagnosis not present

## 2019-10-12 DIAGNOSIS — Z461 Encounter for fitting and adjustment of hearing aid: Secondary | ICD-10-CM | POA: Diagnosis not present

## 2019-10-12 DIAGNOSIS — E1165 Type 2 diabetes mellitus with hyperglycemia: Secondary | ICD-10-CM | POA: Diagnosis not present

## 2019-10-12 DIAGNOSIS — Z974 Presence of external hearing-aid: Secondary | ICD-10-CM | POA: Diagnosis not present

## 2019-10-12 DIAGNOSIS — S8002XA Contusion of left knee, initial encounter: Secondary | ICD-10-CM | POA: Diagnosis not present

## 2019-10-12 DIAGNOSIS — H9190 Unspecified hearing loss, unspecified ear: Secondary | ICD-10-CM | POA: Diagnosis not present

## 2019-10-12 DIAGNOSIS — I739 Peripheral vascular disease, unspecified: Secondary | ICD-10-CM | POA: Diagnosis not present

## 2019-10-12 DIAGNOSIS — F332 Major depressive disorder, recurrent severe without psychotic features: Secondary | ICD-10-CM | POA: Diagnosis not present

## 2019-10-12 DIAGNOSIS — H6123 Impacted cerumen, bilateral: Secondary | ICD-10-CM | POA: Diagnosis not present

## 2019-10-18 DIAGNOSIS — M7052 Other bursitis of knee, left knee: Secondary | ICD-10-CM | POA: Diagnosis not present

## 2019-10-18 DIAGNOSIS — M25462 Effusion, left knee: Secondary | ICD-10-CM | POA: Diagnosis not present

## 2019-10-18 DIAGNOSIS — S8992XA Unspecified injury of left lower leg, initial encounter: Secondary | ICD-10-CM | POA: Diagnosis not present

## 2019-10-18 DIAGNOSIS — M7042 Prepatellar bursitis, left knee: Secondary | ICD-10-CM | POA: Diagnosis not present

## 2019-10-20 DIAGNOSIS — S8002XA Contusion of left knee, initial encounter: Secondary | ICD-10-CM | POA: Diagnosis not present

## 2019-10-20 DIAGNOSIS — I10 Essential (primary) hypertension: Secondary | ICD-10-CM | POA: Diagnosis not present

## 2019-10-20 DIAGNOSIS — Z299 Encounter for prophylactic measures, unspecified: Secondary | ICD-10-CM | POA: Diagnosis not present

## 2019-10-26 DIAGNOSIS — J9601 Acute respiratory failure with hypoxia: Secondary | ICD-10-CM | POA: Diagnosis not present

## 2019-10-26 DIAGNOSIS — J1282 Pneumonia due to coronavirus disease 2019: Secondary | ICD-10-CM | POA: Diagnosis not present

## 2019-10-26 DIAGNOSIS — U071 COVID-19: Secondary | ICD-10-CM | POA: Diagnosis not present

## 2019-10-31 DIAGNOSIS — J040 Acute laryngitis: Secondary | ICD-10-CM | POA: Diagnosis not present

## 2019-10-31 DIAGNOSIS — Z299 Encounter for prophylactic measures, unspecified: Secondary | ICD-10-CM | POA: Diagnosis not present

## 2019-10-31 DIAGNOSIS — J209 Acute bronchitis, unspecified: Secondary | ICD-10-CM | POA: Diagnosis not present

## 2019-11-02 DIAGNOSIS — J209 Acute bronchitis, unspecified: Secondary | ICD-10-CM | POA: Diagnosis not present

## 2019-11-02 DIAGNOSIS — Z299 Encounter for prophylactic measures, unspecified: Secondary | ICD-10-CM | POA: Diagnosis not present

## 2019-11-02 DIAGNOSIS — R062 Wheezing: Secondary | ICD-10-CM | POA: Diagnosis not present

## 2019-11-25 DIAGNOSIS — M5416 Radiculopathy, lumbar region: Secondary | ICD-10-CM | POA: Diagnosis not present

## 2019-11-25 DIAGNOSIS — Z6829 Body mass index (BMI) 29.0-29.9, adult: Secondary | ICD-10-CM | POA: Diagnosis not present

## 2019-11-25 DIAGNOSIS — I10 Essential (primary) hypertension: Secondary | ICD-10-CM | POA: Diagnosis not present

## 2019-11-26 DIAGNOSIS — U071 COVID-19: Secondary | ICD-10-CM | POA: Diagnosis not present

## 2019-11-26 DIAGNOSIS — J1282 Pneumonia due to coronavirus disease 2019: Secondary | ICD-10-CM | POA: Diagnosis not present

## 2019-11-26 DIAGNOSIS — J9601 Acute respiratory failure with hypoxia: Secondary | ICD-10-CM | POA: Diagnosis not present

## 2019-11-28 ENCOUNTER — Other Ambulatory Visit: Payer: Self-pay | Admitting: Neurosurgery

## 2019-11-28 DIAGNOSIS — M5416 Radiculopathy, lumbar region: Secondary | ICD-10-CM

## 2019-12-07 ENCOUNTER — Other Ambulatory Visit: Payer: Self-pay | Admitting: Cardiology

## 2019-12-13 DIAGNOSIS — Z0000 Encounter for general adult medical examination without abnormal findings: Secondary | ICD-10-CM | POA: Diagnosis not present

## 2019-12-13 DIAGNOSIS — E7800 Pure hypercholesterolemia, unspecified: Secondary | ICD-10-CM | POA: Diagnosis not present

## 2019-12-13 DIAGNOSIS — Z125 Encounter for screening for malignant neoplasm of prostate: Secondary | ICD-10-CM | POA: Diagnosis not present

## 2019-12-13 DIAGNOSIS — Z1339 Encounter for screening examination for other mental health and behavioral disorders: Secondary | ICD-10-CM | POA: Diagnosis not present

## 2019-12-13 DIAGNOSIS — I10 Essential (primary) hypertension: Secondary | ICD-10-CM | POA: Diagnosis not present

## 2019-12-13 DIAGNOSIS — R5383 Other fatigue: Secondary | ICD-10-CM | POA: Diagnosis not present

## 2019-12-13 DIAGNOSIS — Z7189 Other specified counseling: Secondary | ICD-10-CM | POA: Diagnosis not present

## 2019-12-13 DIAGNOSIS — Z1331 Encounter for screening for depression: Secondary | ICD-10-CM | POA: Diagnosis not present

## 2019-12-13 DIAGNOSIS — Z6830 Body mass index (BMI) 30.0-30.9, adult: Secondary | ICD-10-CM | POA: Diagnosis not present

## 2019-12-13 DIAGNOSIS — Z79899 Other long term (current) drug therapy: Secondary | ICD-10-CM | POA: Diagnosis not present

## 2019-12-13 DIAGNOSIS — Z299 Encounter for prophylactic measures, unspecified: Secondary | ICD-10-CM | POA: Diagnosis not present

## 2019-12-23 DIAGNOSIS — I739 Peripheral vascular disease, unspecified: Secondary | ICD-10-CM | POA: Diagnosis not present

## 2019-12-23 DIAGNOSIS — M7989 Other specified soft tissue disorders: Secondary | ICD-10-CM | POA: Diagnosis not present

## 2019-12-23 DIAGNOSIS — Z299 Encounter for prophylactic measures, unspecified: Secondary | ICD-10-CM | POA: Diagnosis not present

## 2019-12-23 DIAGNOSIS — E1165 Type 2 diabetes mellitus with hyperglycemia: Secondary | ICD-10-CM | POA: Diagnosis not present

## 2019-12-23 DIAGNOSIS — E1142 Type 2 diabetes mellitus with diabetic polyneuropathy: Secondary | ICD-10-CM | POA: Diagnosis not present

## 2019-12-23 DIAGNOSIS — I10 Essential (primary) hypertension: Secondary | ICD-10-CM | POA: Diagnosis not present

## 2019-12-26 DIAGNOSIS — J1282 Pneumonia due to coronavirus disease 2019: Secondary | ICD-10-CM | POA: Diagnosis not present

## 2019-12-26 DIAGNOSIS — U071 COVID-19: Secondary | ICD-10-CM | POA: Diagnosis not present

## 2019-12-26 DIAGNOSIS — J9601 Acute respiratory failure with hypoxia: Secondary | ICD-10-CM | POA: Diagnosis not present

## 2019-12-28 DIAGNOSIS — J1282 Pneumonia due to coronavirus disease 2019: Secondary | ICD-10-CM | POA: Diagnosis not present

## 2019-12-28 DIAGNOSIS — U071 COVID-19: Secondary | ICD-10-CM | POA: Diagnosis not present

## 2019-12-28 DIAGNOSIS — J9601 Acute respiratory failure with hypoxia: Secondary | ICD-10-CM | POA: Diagnosis not present

## 2019-12-29 DIAGNOSIS — R059 Cough, unspecified: Secondary | ICD-10-CM | POA: Diagnosis not present

## 2019-12-29 DIAGNOSIS — Z299 Encounter for prophylactic measures, unspecified: Secondary | ICD-10-CM | POA: Diagnosis not present

## 2019-12-29 DIAGNOSIS — B370 Candidal stomatitis: Secondary | ICD-10-CM | POA: Diagnosis not present

## 2020-01-02 ENCOUNTER — Telehealth: Payer: Self-pay | Admitting: Internal Medicine

## 2020-01-02 DIAGNOSIS — Z974 Presence of external hearing-aid: Secondary | ICD-10-CM | POA: Diagnosis not present

## 2020-01-02 DIAGNOSIS — H6123 Impacted cerumen, bilateral: Secondary | ICD-10-CM | POA: Diagnosis not present

## 2020-01-02 NOTE — Telephone Encounter (Signed)
PLEASE CALL PATIENT, HE HAS UPCOMING APPT WITH DR. Jena Gauss BUT HE IS CONSTIPATED AND THE LINZESS IS NO LONGER WORKING

## 2020-01-02 NOTE — Telephone Encounter (Signed)
Phoned and spoke to the pt and I advised him that Dr. Kendell Bane is out of the office and since he hasn't been seen since 2019 there wasn't any advise I could give him because Dr's instructions were not followed. The pt laughed and she he knows but any advise or anything we could do he would appreciate it. I advised once Dr returns I can put a note back to him regarding this situation. He agreed.

## 2020-01-03 DIAGNOSIS — I10 Essential (primary) hypertension: Secondary | ICD-10-CM | POA: Diagnosis not present

## 2020-01-03 DIAGNOSIS — Z299 Encounter for prophylactic measures, unspecified: Secondary | ICD-10-CM | POA: Diagnosis not present

## 2020-01-03 DIAGNOSIS — T50Z95A Adverse effect of other vaccines and biological substances, initial encounter: Secondary | ICD-10-CM | POA: Diagnosis not present

## 2020-01-04 NOTE — Telephone Encounter (Signed)
Lmom, waiting on a return call.  

## 2020-01-04 NOTE — Telephone Encounter (Signed)
Routing to [employer]. Pt hasn't been seen and is asking for recommendations for constipation. Pt has an upcoming apt. See previous note. Pt spoke with nurse 2 days ago.

## 2020-01-04 NOTE — Telephone Encounter (Signed)
He can try Colace OTC 100 mg daily and MiraLAX 1 capful 1-2 times a day as needed. If he develops N/V, significant abdominal pain then stop the medications and go to the ER

## 2020-01-05 NOTE — Telephone Encounter (Signed)
Spoke with pt. Pt was notified of Vladimir Faster recommendations of 100 mg of Colace daily with 1-2 capfuls daily. Pt will follow up as directed.

## 2020-01-06 DIAGNOSIS — M170 Bilateral primary osteoarthritis of knee: Secondary | ICD-10-CM | POA: Diagnosis not present

## 2020-01-06 DIAGNOSIS — I10 Essential (primary) hypertension: Secondary | ICD-10-CM | POA: Diagnosis not present

## 2020-01-11 ENCOUNTER — Ambulatory Visit: Payer: PPO | Admitting: Cardiology

## 2020-01-16 DIAGNOSIS — F332 Major depressive disorder, recurrent severe without psychotic features: Secondary | ICD-10-CM | POA: Diagnosis not present

## 2020-01-16 DIAGNOSIS — I10 Essential (primary) hypertension: Secondary | ICD-10-CM | POA: Diagnosis not present

## 2020-01-16 DIAGNOSIS — E1165 Type 2 diabetes mellitus with hyperglycemia: Secondary | ICD-10-CM | POA: Diagnosis not present

## 2020-01-16 DIAGNOSIS — E1142 Type 2 diabetes mellitus with diabetic polyneuropathy: Secondary | ICD-10-CM | POA: Diagnosis not present

## 2020-01-16 DIAGNOSIS — I739 Peripheral vascular disease, unspecified: Secondary | ICD-10-CM | POA: Diagnosis not present

## 2020-01-16 DIAGNOSIS — Z299 Encounter for prophylactic measures, unspecified: Secondary | ICD-10-CM | POA: Diagnosis not present

## 2020-01-25 ENCOUNTER — Ambulatory Visit: Payer: PPO | Admitting: Cardiology

## 2020-01-26 DIAGNOSIS — J9601 Acute respiratory failure with hypoxia: Secondary | ICD-10-CM | POA: Diagnosis not present

## 2020-01-26 DIAGNOSIS — U071 COVID-19: Secondary | ICD-10-CM | POA: Diagnosis not present

## 2020-01-26 DIAGNOSIS — J1282 Pneumonia due to coronavirus disease 2019: Secondary | ICD-10-CM | POA: Diagnosis not present

## 2020-01-28 DIAGNOSIS — J9601 Acute respiratory failure with hypoxia: Secondary | ICD-10-CM | POA: Diagnosis not present

## 2020-01-28 DIAGNOSIS — J1282 Pneumonia due to coronavirus disease 2019: Secondary | ICD-10-CM | POA: Diagnosis not present

## 2020-01-28 DIAGNOSIS — U071 COVID-19: Secondary | ICD-10-CM | POA: Diagnosis not present

## 2020-02-01 ENCOUNTER — Other Ambulatory Visit: Payer: Self-pay

## 2020-02-01 ENCOUNTER — Ambulatory Visit: Payer: PPO | Admitting: Cardiology

## 2020-02-01 ENCOUNTER — Encounter: Payer: Self-pay | Admitting: Cardiology

## 2020-02-01 VITALS — BP 117/69 | HR 88 | Temp 98.2°F | Resp 16 | Ht 71.0 in | Wt 194.2 lb

## 2020-02-01 DIAGNOSIS — I2510 Atherosclerotic heart disease of native coronary artery without angina pectoris: Secondary | ICD-10-CM | POA: Diagnosis not present

## 2020-02-01 DIAGNOSIS — I10 Essential (primary) hypertension: Secondary | ICD-10-CM | POA: Diagnosis not present

## 2020-02-01 DIAGNOSIS — I480 Paroxysmal atrial fibrillation: Secondary | ICD-10-CM

## 2020-02-01 DIAGNOSIS — I4892 Unspecified atrial flutter: Secondary | ICD-10-CM | POA: Diagnosis not present

## 2020-02-01 NOTE — Progress Notes (Signed)
Primary Physician/Referring:  Glenda Chroman, MD  Patient ID: Brett Matthews, male    DOB: 1932/12/11, 87 y.o.   MRN: [id number]   Chief Complaint  Patient presents with  . Hypertension  . Atrial Fibrillation  . Follow-up   HPI:    Brett Matthews  is a 87 y.o.  Caucasian male with paroxysmal atrial fibrillation, hypertension, hyperlipidemia, controlled diabetes mellitus, moderate coronary artery disease in mid circumflex with 60% stenosis by angiography in 2013, last presentation on 01/16/2019 with chest pain and dyspnea SP direct-current cardioversion on 01/18/2019 and presently on flecainide and Xarelto.  He presents here for follow-up of hypertension and atrial fibrillation.  He was diagnosed with Covid pneumonia about 6 months ago, since then he has been vaccinated but states that he has been extremely fatigued and tired.  Denies palpitations or chest pain.  Dyspnea is remained stable.  States that his diabetes is now well controlled, tolerating anticoagulation without any bleeding diathesis.  He did not bring his medications with him today.  Past Medical History:  Diagnosis Date  . Anemia, iron deficiency    Negative capsule endoscopy  . Arthritis   . Coronary atherosclerosis of native coronary artery    60 % circumflex stenosis; EF 65%, Cath 6/13  . Diverticula of colon    Pancolonic  . DM (diabetes mellitus), type 2, uncontrolled ([place])   . Dyspnea    Normal cardiopulmonary function test in July 2008, negative echocardiogram with "bubble" study for inter-cardiac shunt.  . Essential hypertension, benign   . Gastroesophageal reflux disease   . Hemorrhoids   . Hyperplastic colon polyp 04/06/2007  . Hypothyroidism 2003   Status post total thyroidectomy  . Migraines   . Neoplasm of lymphatic and hematopoietic tissue   . Paroxysmal atrial fibrillation ([place])    Diagnosed 2005  . Skin cancer    Past Surgical History:  Procedure Laterality Date  . BACK SURGERY    . BONE MARROW  BIOPSY  1990's  . CARDIAC CATHETERIZATION    . CARDIOVERSION N/A 01/18/2019   Procedure: CARDIOVERSION;  Surgeon: Adrian Prows, MD;  Location: [facility] ENDOSCOPY;  Service: Cardiovascular;  Laterality: N/A;  . CATARACT EXTRACTION Bilateral   . COLONOSCOPY  04/06/2007   Dr. Gala Romney- Normal rectum with scattered pancolonic diverticula and slightly redundant elongated colon, diminutive polpy midsigmoid, remainder of colonic mucosa appeared normal. bx= hyperplastic polyp  . COLONOSCOPY N/A 11/29/2012   IHK:[id number] preparation. Friable anal canal/internal hemorrhoids; otherwise, normal rectum. Normal-appearing colonic mucosa  . COLONOSCOPY WITH PROPOFOL N/A 03/12/2017   Procedure: COLONOSCOPY WITH PROPOFOL;  Surgeon: Daneil Dolin, MD;  Location: [facility] ENDO SUITE;  Service: Endoscopy;  Laterality: N/A;  7:30am  . ESOPHAGOGASTRODUODENOSCOPY  04/06/2007   Dr. Gala Romney- normal esophagus s/p nissen fundoplication, intact nissen wrap o/w normal stomach  . ESOPHAGOGASTRODUODENOSCOPY N/A 11/29/2012   RMR: Abnormall distal esophagus bx c/w GERD. prior fundoplication. Gastric polyps  -bx benign. Status post biopsy of normal--appearing duodenal mucosa (bx neg)  . HEMORRHOID SURGERY N/A 04/06/2017   Procedure: EXTENSIVE HEMORRHOIDECTOMY;  Surgeon: Aviva Signs, MD;  Location: [facility] ORS;  Service: General;  Laterality: N/A;  . LEFT HEART CATHETERIZATION WITH CORONARY ANGIOGRAM N/A 06/27/2011   Procedure: LEFT HEART CATHETERIZATION WITH CORONARY ANGIOGRAM;  Surgeon: Thayer Headings, MD;  Location: [facility] CATH LAB;  Service: Cardiovascular;  Laterality: N/A;  . NISSEN FUNDOPLICATION    . POLYPECTOMY  03/12/2017   Procedure: POLYPECTOMY;  Surgeon: Daneil Dolin, MD;  Location: [facility] ENDO SUITE;  Service: Endoscopy;;  cecal x3  . POSTERIOR LAMINECTOMY / DECOMPRESSION LUMBAR SPINE  ~ 1990's  . SKIN CANCER EXCISION     "off my back and chest; from sun"   Family History  Problem Relation Age of Onset  . Cancer Other   . Stroke Other   .  Hypertension Mother   . Stroke Mother   . Cancer Father   . Cancer Sister   . Stroke Brother   . Colon cancer Neg Hx    Social History   Tobacco Use  . Smoking status: Never Smoker  . Smokeless tobacco: Never Used  Substance Use Topics  . Alcohol use: No  Marital Status: Divorced  ROS  Review of Systems  Constitutional: Positive for malaise/fatigue.  Cardiovascular: Positive for dyspnea on exertion. Negative for chest pain and leg swelling.  Musculoskeletal: Positive for back pain and joint pain.  Gastrointestinal: Negative for melena.   Objective   Vitals with BMI 02/01/2020 08/22/2019 08/22/2019  Height $Remov'5\' 11"'JDeSEd$  - -  Weight 194 lbs 3 oz - -  BMI 17.5 - -  Systolic 102 585 277  Diastolic 69 85 76  Pulse 88 62 60    Physical Exam Constitutional:      Appearance: Normal appearance.     Comments: He is well-built and well-nourished and appears younger than stated age.  Cardiovascular:     Rate and Rhythm: Normal rate. Rhythm irregularly irregular.     Pulses: Intact distal pulses.     Heart sounds: Normal heart sounds. No murmur heard. No gallop.      Comments: No leg edema, no JVD. Pulmonary:     Effort: Pulmonary effort is normal.     Breath sounds: Normal breath sounds.  Abdominal:     General: Bowel sounds are normal.     Palpations: Abdomen is soft.    Laboratory examination:   Recent Labs    08/20/19 0505 08/21/19 0651 08/22/19 0642  NA 136 133* 134*  K 4.3 3.7 3.7  CL 100 100 101  CO2 $Re'26 24 26  'weH$ GLUCOSE 211* 188* 214*  BUN $Re'19 17 18  'kMJ$ CREATININE 0.69 0.58* 0.67  CALCIUM 9.1 8.8* 8.8*  GFRNONAA >60 >60 >60  GFRAA >60 >60 >60   CrCl cannot be calculated (Patient's most recent lab result is older than the maximum 21 days allowed.).  CMP Latest Ref Rng & Units 08/22/2019 08/21/2019 08/20/2019  Glucose 70 - 99 mg/dL 214(H) 188(H) 211(H)  BUN 8 - 23 mg/dL $Remove'18 17 19  'ssZJdLy$ Creatinine 0.61 - 1.24 mg/dL 0.67 0.58(L) 0.69  Sodium 135 - 145 mmol/L 134(L) 133(L) 136   Potassium 3.5 - 5.1 mmol/L 3.7 3.7 4.3  Chloride 98 - 111 mmol/L 101 100 100  CO2 22 - 32 mmol/L $RemoveB'26 24 26  'FMruvlkZ$ Calcium 8.9 - 10.3 mg/dL 8.8(L) 8.8(L) 9.1  Total Protein 6.5 - 8.1 g/dL 5.4(L) 5.5(L) 6.1(L)  Total Bilirubin 0.3 - 1.2 mg/dL 0.5 0.4 0.4  Alkaline Phos 38 - 126 U/L 65 64 65  AST 15 - 41 U/L 14(L) 13(L) 14(L)  ALT 0 - 44 U/L $Remo'16 15 16   'ZgkoA$ CBC Latest Ref Rng & Units 08/22/2019 08/21/2019 08/20/2019  WBC 4.0 - 10.5 K/uL 6.7 6.6 9.1  Hemoglobin 13.0 - 17.0 g/dL 11.1(L) 11.2(L) 12.0(L)  Hematocrit 39.0 - 52.0 % 35.2(L) 35.3(L) 37.8(L)  Platelets 150 - 400 K/uL 296 307 284   HEMOGLOBIN A1C Lab Results  Component Value Date   HGBA1C 7.6 (H) 08/18/2019  MPG 171.42 08/18/2019   External Labs:  02/15/2019: Glucose 217. Albumin 4.07, Potassium 4.4, Glucose 238 (H), Total Bilirubin 0.3, Protein, Total 6.2, eGFR >6.,  AST 14.4, ALT 16 , Chloride 97 (L), Creatinine 0.79, BUN 14, Alkaline Phosphatase 89.  11/08/2018: Serum glucose 149 mg, BUN 16, creatinine 0.89, eGFR >60 mL.  CBC normal.  11/05/2018: TSH 2.41 normal  12/08/2018: Total cholesterol 173, triglycerides 136, HDL 41, LDL 118. Serum glucose 167 mg, BUN 11, creatinine 0.99, eGFR >70 mL, potassium 3.6.  CMP otherwise normal.  Alkaline phosphatase minimally elevated at 01/07/20.  HB 12.8/HCT 39.9, platelets 297.  TSH normal. PSA mildly elevated.   Medications and allergies  No Known Allergies  Current Outpatient Medications on File Prior to Visit  Medication Sig Dispense Refill  . amLODipine (NORVASC) 10 MG tablet Take 10 mg by mouth daily.     Marland Kitchen ascorbic acid (VITAMIN C) 500 MG tablet Take 1 tablet (500 mg total) by mouth daily.    Marland Kitchen atorvastatin (LIPITOR) 10 MG tablet TAKE 1 TABLET(10 MG) BY MOUTH DAILY 30 tablet 3  . benazepril (LOTENSIN) 40 MG tablet Take 0.5 tablets (20 mg total) by mouth daily. (Patient taking differently: Take 40 mg by mouth daily. Take one tablet daily)    . carvedilol (COREG) 12.5 MG tablet Take 12.5 mg  by mouth 2 (two) times daily with a meal. Take 1 and 1/2 tablets BID    . citalopram (CELEXA) 20 MG tablet Take 1 tablet by mouth daily.  3  . clonazePAM (KLONOPIN) 1 MG tablet Take 1 mg by mouth at bedtime. For sleep    . dexamethasone (DECADRON) 6 MG tablet Take 1 tablet (6 mg total) by mouth daily. 5 tablet 0  . docusate sodium (COLACE) 100 MG capsule Take 100 mg by mouth daily as needed for mild constipation.    . folic acid (FOLVITE) 1 MG tablet Take 1 tablet by mouth daily.  2  . glimepiride (AMARYL) 2 MG tablet Take 2 mg by mouth 2 (two) times daily.  9  . isosorbide dinitrate (ISORDIL) 30 MG tablet TAKE 1 TABLET(30 MG) BY MOUTH THREE TIMES DAILY 90 tablet 2  . levothyroxine (SYNTHROID) 125 MCG tablet Take 125 mcg by mouth daily before breakfast. SYNTHROID - brand name only    . LINZESS 290 MCG CAPS capsule Take 1 capsule by mouth daily as needed.     . metFORMIN (GLUCOPHAGE) 500 MG tablet Take 2 tabs (1,$RemoveB'000mg'yhzfOqQK$ ) by mouth every morning & 1 tab ($Remo'500mg'MMGaU$ ) every evening    . pantoprazole (PROTONIX) 40 MG tablet Take 40 mg by mouth daily.    . rivaroxaban (XARELTO) 20 MG TABS tablet Take 20 mg by mouth daily with supper.    . tamsulosin (FLOMAX) 0.4 MG CAPS capsule Take 0.4 mg by mouth daily after supper.     . zinc sulfate 220 (50 Zn) MG capsule Take 1 capsule (220 mg total) by mouth daily.     No current facility-administered medications on file prior to visit.    Radiology:   Chest x-ray PA and lateral view 11/08/2018: Normal chest x-ray with no active lung disease.  Cardiac Studies:   Coronary  angiogram 06/27/2011: Mild left main disease, mid circumflex 60% stenosis.  Mild luminal irregularity of the RCA and normal LAD.  LVEF 65%.  Lexiscan Tetrofosmin Stress Test 12/27/2018: Nondiagnostic ECG stress. Normal myocardial perfusion. Stress LV EF: 58%.  No previous exam available for comparison. Low risk study.   Echocardiogram 12/09/2018: Left  ventricle cavity is normal in size.  Moderate concentric hypertrophy of the left ventricle. Normal LV systolic function with EF 65%. Normal global wall motion. Doppler evidence of grade I (impaired) diastolic dysfunction, normal LAP. Mild (Grade I) mitral regurgitation. Mild tricuspid regurgitation. Estimated pulmonary artery systolic pressure is 23 mmHg.  EKG     EKG 02/01/2020: Atypical atrial flutter with variable AV conduction at rate of 80 bpm, left axis deviation, left anterior fascicular block.  Right bundle branch block.  Low-voltage complexes.  Pulmonary disease pattern.    EKG 07/05/2019: Sinus bradycardia at the rate of 59 bpm, left atrial enlargement, left axis deviation, left anterior fascicular block.  Poor R progression, cannot exclude anteroseptal infarct old.  IVCD, borderline criteria for LVH.     01/17/2019: Atypical atrial flutter with variable ventricular response at the rate of 110 bpm, left axis deviation, left anterior fascicular block.  IVCD.  Poor R-wave progression, cannot exclude anterolateral infarct old.  No evidence of ischemia.  Low-voltage limb leads, consider COPD.  Assessment     ICD-10-CM   1. Paroxysmal atrial fibrillation (HCC)  I48.0 EKG 12-Lead  2. Essential hypertension, benign  I10   3. Coronary artery disease involving native coronary artery of native heart without angina pectoris  I25.10      No orders of the defined types were placed in this encounter.  There are no discontinued medications. Recommendations:   Brett Matthews  is a 87 y.o. Caucasian male with paroxysmal atrial fibrillation, hypertension, hyperlipidemia, controlled diabetes mellitus, moderate coronary artery disease in mid circumflex with 60% stenosis by angiography in 2013, last presentation on 01/16/2019 with chest pain and dyspnea SP direct-current cardioversion on 01/18/2019 and presently on flecainide and Xarelto.  He presents here for follow-up of hypertension and atrial fibrillation.  He was diagnosed with  Covid pneumonia about 6 months ago, since then he has been vaccinated but states that he has been extremely fatigued and tired.  We have had difficulty in reconciling his medications.  Today again he did not bring his medication list. Pharmacy confirmed that he is still taking Xarelto. No bleeding diathesis. Suspect his fatigue is probably related to recurrence of AF (Atypical AFL). Schedule for Direct current cardioversion. I have discussed regarding risks benefits rate control vs rhythm control with the patient. Patient understands cardiac arrest and need for CPR, aspiration pneumonia, but not limited to these. Patient is willing.   I will see him back in 3-4 weeks for f/u post cardioversion. BP is now well controlled, he only has moderate CAD and no angina. Adrian Prows, MD, [facility] 02/01/2020, 12:25 PM Office: [phone]

## 2020-02-02 ENCOUNTER — Ambulatory Visit: Payer: PPO

## 2020-02-02 ENCOUNTER — Other Ambulatory Visit: Payer: Self-pay | Admitting: Pharmacist

## 2020-02-02 DIAGNOSIS — I4892 Unspecified atrial flutter: Secondary | ICD-10-CM

## 2020-02-02 DIAGNOSIS — I480 Paroxysmal atrial fibrillation: Secondary | ICD-10-CM

## 2020-02-02 DIAGNOSIS — E782 Mixed hyperlipidemia: Secondary | ICD-10-CM

## 2020-02-02 MED ORDER — ATORVASTATIN CALCIUM 10 MG PO TABS: 10.0000 mg | ORAL_TABLET | Freq: Every day | ORAL | 2 refills | Status: DC

## 2020-02-02 NOTE — Addendum Note (Signed)
Addended by: Manuela Schwartz T on: 02/02/2020 10:36 AM   Modules accepted: Orders

## 2020-02-07 ENCOUNTER — Ambulatory Visit: Payer: PPO | Admitting: Internal Medicine

## 2020-02-07 ENCOUNTER — Encounter: Payer: Self-pay | Admitting: Internal Medicine

## 2020-02-11 ENCOUNTER — Other Ambulatory Visit (HOSPITAL_COMMUNITY)
Admission: RE | Admit: 2020-02-11 | Discharge: 2020-02-11 | Disposition: A | Discharge status: Home / Self Care | Payer: PPO | Source: Ambulatory Visit | Attending: Cardiology | Admitting: Cardiology

## 2020-02-11 DIAGNOSIS — U071 COVID-19: Secondary | ICD-10-CM | POA: Diagnosis not present

## 2020-02-11 DIAGNOSIS — Z01812 Encounter for preprocedural laboratory examination: Secondary | ICD-10-CM | POA: Diagnosis present

## 2020-02-11 LAB — SARS CORONAVIRUS 2 (TAT 6-24 HRS): SARS Coronavirus 2: POSITIVE — AB

## 2020-02-12 ENCOUNTER — Telehealth: Payer: Self-pay | Admitting: Cardiology

## 2020-02-12 NOTE — Progress Notes (Signed)
Called Ingrid from the on-call service of Dr. Christen Butter with + covid results for this pt. The pt is to have a procedure on Tues. 2/8 at [facility] at 1130. The pt has not been notified as there will be pertinent questions the provider needs to answer.  These are the current guidelines:  Positive Results for:  Asymptomatic: Procedure postponed and quarantined for 10 days, unless the procedure is urgent. Symptomatic: Procedure postponed and quarantined for 14 days. Hospitalized with Covid: postponed and quarantined  for 21 days. Immunocompromised: Procedure postponed and quarantine for 20 days.  The pt will not be retested for 90 days from the + result.

## 2020-02-12 NOTE — Progress Notes (Signed)
[facility] called back and + covid results relayed.

## 2020-02-13 NOTE — Telephone Encounter (Signed)
Patient has been notified. Front desk will call patient for re-scheduling time.

## 2020-02-14 ENCOUNTER — Encounter (HOSPITAL_COMMUNITY): Admission: RE | Payer: Self-pay | Source: Home / Self Care

## 2020-02-14 ENCOUNTER — Ambulatory Visit (HOSPITAL_COMMUNITY): Admission: RE | Admit: 2020-02-14 | Payer: PPO | Source: Home / Self Care | Admitting: Cardiology

## 2020-02-14 DIAGNOSIS — F1320 Sedative, hypnotic or anxiolytic dependence, uncomplicated: Secondary | ICD-10-CM | POA: Diagnosis not present

## 2020-02-14 DIAGNOSIS — I10 Essential (primary) hypertension: Secondary | ICD-10-CM | POA: Diagnosis not present

## 2020-02-14 DIAGNOSIS — Z299 Encounter for prophylactic measures, unspecified: Secondary | ICD-10-CM | POA: Diagnosis not present

## 2020-02-14 DIAGNOSIS — I4891 Unspecified atrial fibrillation: Secondary | ICD-10-CM | POA: Diagnosis not present

## 2020-02-14 DIAGNOSIS — U071 COVID-19: Secondary | ICD-10-CM | POA: Diagnosis not present

## 2020-02-14 SURGERY — CARDIOVERSION
Anesthesia: General

## 2020-02-23 DIAGNOSIS — M79676 Pain in unspecified toe(s): Secondary | ICD-10-CM | POA: Diagnosis not present

## 2020-02-23 DIAGNOSIS — L84 Corns and callosities: Secondary | ICD-10-CM | POA: Diagnosis not present

## 2020-02-23 DIAGNOSIS — B351 Tinea unguium: Secondary | ICD-10-CM | POA: Diagnosis not present

## 2020-02-23 DIAGNOSIS — E1142 Type 2 diabetes mellitus with diabetic polyneuropathy: Secondary | ICD-10-CM | POA: Diagnosis not present

## 2020-02-28 DIAGNOSIS — U071 COVID-19: Secondary | ICD-10-CM | POA: Diagnosis not present

## 2020-02-28 DIAGNOSIS — J9601 Acute respiratory failure with hypoxia: Secondary | ICD-10-CM | POA: Diagnosis not present

## 2020-02-28 DIAGNOSIS — J1282 Pneumonia due to coronavirus disease 2019: Secondary | ICD-10-CM | POA: Diagnosis not present

## 2020-03-05 DIAGNOSIS — M170 Bilateral primary osteoarthritis of knee: Secondary | ICD-10-CM | POA: Diagnosis not present

## 2020-03-05 DIAGNOSIS — I10 Essential (primary) hypertension: Secondary | ICD-10-CM | POA: Diagnosis not present

## 2020-03-06 ENCOUNTER — Other Ambulatory Visit: Payer: Self-pay

## 2020-03-06 ENCOUNTER — Encounter: Payer: Self-pay | Admitting: Internal Medicine

## 2020-03-06 ENCOUNTER — Ambulatory Visit: Payer: PPO | Admitting: Internal Medicine

## 2020-03-06 VITALS — BP 122/79 | HR 89 | Temp 97.5°F | Ht 71.0 in | Wt 194.0 lb

## 2020-03-06 DIAGNOSIS — K5909 Other constipation: Secondary | ICD-10-CM

## 2020-03-06 DIAGNOSIS — K219 Gastro-esophageal reflux disease without esophagitis: Secondary | ICD-10-CM | POA: Diagnosis not present

## 2020-03-06 NOTE — Progress Notes (Signed)
Primary Care Physician:  Glenda Chroman, MD Primary Gastroenterologist:  Dr. Gala Romney  Pre-Procedure History & Physical: HPI:  Brett Matthews is a 87 y.o. male here for follow-up of GERD and chronic constipation.  Continues on Protonix 40 mg daily.  Good control of reflux symptoms.  No dysphagia.  Denies abdominal pain  Chronic constipation.  No rectal bleeding.  These days utilizes either 1-2 capfuls of MiraLAX or Linzess 290 capsule once daily  - depending on availability with good control of his symptoms.  He is undergoing cardioversion in about 2 weeks.  He is anticoagulated.  Patient had a tough time with Covid infection since he was seen here previously but has recovered.  Past Medical History:  Diagnosis Date  . Anemia, iron deficiency    Negative capsule endoscopy  . Arthritis   . Coronary atherosclerosis of native coronary artery    60 % circumflex stenosis; EF 65%, Cath 6/13  . Diverticula of colon    Pancolonic  . DM (diabetes mellitus), type 2, uncontrolled ([place])   . Dyspnea    Normal cardiopulmonary function test in July 2008, negative echocardiogram with "bubble" study for inter-cardiac shunt.  . Essential hypertension, benign   . Gastroesophageal reflux disease   . Hemorrhoids   . Hyperplastic colon polyp 04/06/2007  . Hypothyroidism 2003   Status post total thyroidectomy  . Migraines   . Neoplasm of lymphatic and hematopoietic tissue   . Paroxysmal atrial fibrillation ([place])    Diagnosed 2005  . Skin cancer     Past Surgical History:  Procedure Laterality Date  . BACK SURGERY    . BONE MARROW BIOPSY  1990's  . CARDIAC CATHETERIZATION    . CARDIOVERSION N/A 01/18/2019   Procedure: CARDIOVERSION;  Surgeon: Adrian Prows, MD;  Location: [facility] ENDOSCOPY;  Service: Cardiovascular;  Laterality: N/A;  . CATARACT EXTRACTION Bilateral   . COLONOSCOPY  04/06/2007   Dr. Gala Romney- Normal rectum with scattered pancolonic diverticula and slightly redundant elongated colon,  diminutive polpy midsigmoid, remainder of colonic mucosa appeared normal. bx= hyperplastic polyp  . COLONOSCOPY N/A 11/29/2012   [id number] preparation. Friable anal canal/internal hemorrhoids; otherwise, normal rectum. Normal-appearing colonic mucosa  . COLONOSCOPY WITH PROPOFOL N/A 03/12/2017   Procedure: COLONOSCOPY WITH PROPOFOL;  Surgeon: Daneil Dolin, MD;  [facility] ENDO SUITE;  Service: Endoscopy;  Laterality: N/A;  7:30am  . ESOPHAGOGASTRODUODENOSCOPY  04/06/2007   Dr. Gala Romney- normal esophagus s/p nissen fundoplication, intact nissen wrap o/w normal stomach  . ESOPHAGOGASTRODUODENOSCOPY N/A 11/29/2012   RMR: Abnormall distal esophagus bx c/w GERD. prior fundoplication. Gastric polyps  -bx benign. Status post biopsy of normal--appearing duodenal mucosa (bx neg)  . HEMORRHOID SURGERY N/A 04/06/2017   Procedure: EXTENSIVE HEMORRHOIDECTOMY;  Surgeon: Aviva Signs, MD;  Location: [facility] ORS;  Service: General;  Laterality: N/A;  . LEFT HEART CATHETERIZATION WITH CORONARY ANGIOGRAM N/A 06/27/2011   Procedure: LEFT HEART CATHETERIZATION WITH CORONARY ANGIOGRAM;  Surgeon: Thayer Headings, MD;  Location: [facility] CATH LAB;  Service: Cardiovascular;  Laterality: N/A;  . NISSEN FUNDOPLICATION    . POLYPECTOMY  03/12/2017   Procedure: POLYPECTOMY;  Surgeon: Daneil Dolin, MD;  Location: [facility] ENDO SUITE;  Service: Endoscopy;;  cecal x3  . POSTERIOR LAMINECTOMY / DECOMPRESSION LUMBAR SPINE  ~ 1990's  . SKIN CANCER EXCISION     "off my back and chest; from sun"    Prior to Admission medications   Medication Sig Start Date End Date Taking? Authorizing Provider  acetaminophen (TYLENOL) 500  MG tablet Take 1,000 mg by mouth every 8 (eight) hours as needed for moderate pain.    [provider]  albuterol (VENTOLIN HFA) 108 (90 Base) MCG/ACT inhaler Inhale 1-2 puffs into the lungs every 6 (six) hours as needed for wheezing or shortness of breath.    [provider]  amLODipine (NORVASC) 10 MG  tablet Take 10 mg by mouth daily.  04/04/19   [provider]  atorvastatin (LIPITOR) 10 MG tablet Take 1 tablet (10 mg total) by mouth daily. 02/02/20   Adrian Prows, MD  benazepril (LOTENSIN) 40 MG tablet Take 0.5 tablets (20 mg total) by mouth daily. Patient taking differently: Take 40 mg by mouth daily. 01/18/19   Adrian Prows, MD  carvedilol (COREG) 12.5 MG tablet Take 12.5 mg by mouth 2 (two) times daily with a meal.    [provider]  Cholecalciferol (D3-1000) 25 MCG (1000 UT) capsule Take 1,000 Units by mouth daily.    [provider]  citalopram (CELEXA) 40 MG tablet Take 40 mg by mouth daily. 03/23/17   [provider]  clonazePAM (KLONOPIN) 1 MG tablet Take 1 mg by mouth at bedtime. For sleep    [provider]  diltiazem (CARDIZEM CD) 240 MG 24 hr capsule Take 240 mg by mouth daily.    [provider]  fluticasone (FLONASE) 50 MCG/ACT nasal spray Place 1 spray into both nostrils daily.    [provider]  folic acid (FOLVITE) 1 MG tablet Take 1 tablet by mouth daily. 03/12/17   [provider]  gabapentin (NEURONTIN) 300 MG capsule Take 100 mg by mouth 3 (three) times daily.    [provider]  glimepiride (AMARYL) 2 MG tablet Take 2 mg by mouth 2 (two) times daily. 03/10/17   [provider]  Insulin Degludec (TRESIBA) 100 UNIT/ML SOLN Inject 16 Units into the skin daily.    [provider]  isosorbide dinitrate (ISORDIL) 30 MG tablet TAKE 1 TABLET(30 MG) BY MOUTH THREE TIMES DAILY Patient not taking: No sig reported 12/07/19   Adrian Prows, MD  levothyroxine (SYNTHROID) 125 MCG tablet Take 125 mcg by mouth daily before breakfast. SYNTHROID - brand name only    [provider]  linaclotide (LINZESS) 72 MCG capsule Take 72 mcg by mouth daily as needed (constipation). 08/19/18   [provider]  metFORMIN (GLUCOPHAGE) 500 MG tablet Take 500-1,000 mg by mouth See admin instructions. Take  2 tabs (1,$RemoveB'000mg'UteGAfEC$ ) by mouth every morning & 1 tab ($Remo'500mg'PQyAO$ ) every evening    [provider]  pantoprazole (PROTONIX) 40 MG tablet Take 40 mg by mouth daily.    [provider]  polyethylene glycol (MIRALAX / GLYCOLAX) 17 g packet Take 17 g by mouth daily as needed for moderate constipation.    [provider]  rivaroxaban (XARELTO) 20 MG TABS tablet Take 20 mg by mouth daily with supper.    [provider]  tamsulosin (FLOMAX) 0.4 MG CAPS capsule Take 0.4 mg by mouth daily after supper.  09/09/12   [provider]  vitamin B-12 (CYANOCOBALAMIN) 1000 MCG tablet Take 1,000 mcg by mouth daily.    [provider]  zinc gluconate 50 MG tablet Take 50 mg by mouth daily.    [provider]    Allergies as of 03/06/2020  . (No Known Allergies)    Family History  Problem Relation Age of Onset  . Cancer Other   . Stroke Other   . Hypertension  Mother   . Stroke Mother   . Cancer Father   . Cancer Sister   . Stroke Brother   . Colon cancer Neg Hx     Social History   Socioeconomic History  . Marital status: Divorced    Spouse name: Not on file  . Number of children: 2  . Years of education: Not on file  . Highest education level: Not on file  Occupational History  . Occupation: Full Time  Tobacco Use  . Smoking status: Never Smoker  . Smokeless tobacco: Never Used  Vaping Use  . Vaping Use: Never used  Substance and Sexual Activity  . Alcohol use: No  . Drug use: No  . Sexual activity: Never  Other Topics Concern  . Not on file  Social History Narrative  . Not on file   Social Determinants of Health   Financial Resource Strain: Not on file  Food Insecurity: Not on file  Transportation Needs: Not on file  Physical Activity: Not on file  Stress: Not on file  Social Connections: Not on file  Intimate Partner Violence: Not on file    Review of Systems: See HPI, otherwise negative ROS  Physical Exam: There were  no vitals taken for this visit. General:   Alert, notably frail pleasant and cooperative in NAD Neck:  Supple; no masses or thyromegaly. No significant cervical adenopathy. Lungs:  Clear throughout to auscultation.   No wheezes, crackles, or rhonchi. No acute distress. Heart:  Regular rate and rhythm; no murmurs, clicks, rubs,  or gallops. Abdomen: Non-distended, normal bowel sounds.  Soft and nontender without appreciable mass or hepatosplenomegaly.  Pulses:  Normal pulses noted. Extremities:  Without clubbing or edema.  Impression/Plan: Pleasant gentleman longstanding GERD well-controlled on Protonix 40 mg daily.  No alarm features.  Chronic constipation managed by juggling either MiraLAX or Linzess depending on what he has access to these days.  Cost continues to be an issue with Linzess.  As long as he has one of the other he does fairly well.  Recommendations: Continue Protonix 40 mg daily for acid reflux disease  Continue to use either 2 capfuls of MiraLAX daily or Linzess 290 once daily as needed for constipation.  Samples of Linzess provided.  No further GI evaluation needed at this time  Plan for follow-up office visit with me in 6 months   Notice: This dictation was prepared with Dragon dictation along with smaller phrase technology. Any transcriptional errors that result from this process are unintentional and may not be corrected upon review.

## 2020-03-06 NOTE — Patient Instructions (Signed)
Continue Protonix 40 mg daily for acid reflux disease  Continue to use either 2 capfuls of MiraLAX daily or Linzess 290 once daily as needed for constipation  No further GI evaluation needed at this time  Plan for follow-up office visit with me in 6 months

## 2020-03-07 ENCOUNTER — Ambulatory Visit: Payer: PPO | Admitting: Cardiology

## 2020-03-12 ENCOUNTER — Other Ambulatory Visit: Payer: Self-pay | Admitting: Student

## 2020-03-12 ENCOUNTER — Telehealth: Payer: Self-pay

## 2020-03-12 DIAGNOSIS — I480 Paroxysmal atrial fibrillation: Secondary | ICD-10-CM | POA: Diagnosis not present

## 2020-03-13 LAB — CBC
Hematocrit: 34.7 % — ABNORMAL LOW (ref 37.5–51.0)
Hemoglobin: 10.4 g/dL — ABNORMAL LOW (ref 13.0–17.7)
MCH: 23.9 pg — ABNORMAL LOW (ref 26.6–33.0)
MCHC: 30 g/dL — ABNORMAL LOW (ref 31.5–35.7)
MCV: 80 fL (ref 79–97)
Platelets: 317 10*3/uL (ref 150–450)
RBC: 4.36 x10E6/uL (ref 4.14–5.80)
RDW: 16.6 % — ABNORMAL HIGH (ref 11.6–15.4)
WBC: 6.1 10*3/uL (ref 3.4–10.8)

## 2020-03-13 LAB — SARS-COV-2 SEMI-QUANTITATIVE TOTAL ANTIBODY, SPIKE
SARS-CoV-2 Semi-Quant Total Ab: >2500 U/mL (ref <0.8)
SARS-CoV-2 Spike Ab Interp: POSITIVE

## 2020-03-13 LAB — BASIC METABOLIC PANEL
BUN/Creatinine Ratio: 23 (ref 10–24)
BUN: 18 mg/dL (ref 8–27)
CO2: 22 mmol/L (ref 20–29)
Calcium: 10 mg/dL (ref 8.6–10.2)
Chloride: 102 mmol/L (ref 96–106)
Creatinine, Ser: 0.79 mg/dL (ref 0.76–1.27)
Glucose: 157 mg/dL — ABNORMAL HIGH (ref 65–99)
Potassium: 4.3 mmol/L (ref 3.5–5.2)
Sodium: 138 mmol/L (ref 134–144)
eGFR: 87 mL/min/{1.73_m2} (ref >59)

## 2020-03-20 ENCOUNTER — Encounter (HOSPITAL_COMMUNITY): Payer: Self-pay | Admitting: Cardiology

## 2020-03-20 ENCOUNTER — Ambulatory Visit (HOSPITAL_COMMUNITY): Payer: PPO | Admitting: Anesthesiology

## 2020-03-20 ENCOUNTER — Encounter (HOSPITAL_COMMUNITY)
Admission: RE | Disposition: A | Discharge status: Home / Self Care | Payer: Self-pay | Source: Home / Self Care | Attending: Cardiology

## 2020-03-20 ENCOUNTER — Other Ambulatory Visit: Payer: Self-pay

## 2020-03-20 ENCOUNTER — Ambulatory Visit (HOSPITAL_COMMUNITY)
Admission: RE | Admit: 2020-03-20 | Discharge: 2020-03-20 | Disposition: A | Discharge status: Home / Self Care | Payer: PPO | Attending: Cardiology | Admitting: Cardiology

## 2020-03-20 DIAGNOSIS — Z79899 Other long term (current) drug therapy: Secondary | ICD-10-CM | POA: Diagnosis not present

## 2020-03-20 DIAGNOSIS — I10 Essential (primary) hypertension: Secondary | ICD-10-CM | POA: Insufficient documentation

## 2020-03-20 DIAGNOSIS — I4891 Unspecified atrial fibrillation: Secondary | ICD-10-CM | POA: Diagnosis not present

## 2020-03-20 DIAGNOSIS — E785 Hyperlipidemia, unspecified: Secondary | ICD-10-CM | POA: Diagnosis not present

## 2020-03-20 DIAGNOSIS — Z794 Long term (current) use of insulin: Secondary | ICD-10-CM | POA: Diagnosis not present

## 2020-03-20 DIAGNOSIS — J9601 Acute respiratory failure with hypoxia: Secondary | ICD-10-CM | POA: Diagnosis not present

## 2020-03-20 DIAGNOSIS — Z79890 Hormone replacement therapy (postmenopausal): Secondary | ICD-10-CM | POA: Diagnosis not present

## 2020-03-20 DIAGNOSIS — I2510 Atherosclerotic heart disease of native coronary artery without angina pectoris: Secondary | ICD-10-CM | POA: Insufficient documentation

## 2020-03-20 DIAGNOSIS — G43909 Migraine, unspecified, not intractable, without status migrainosus: Secondary | ICD-10-CM | POA: Diagnosis not present

## 2020-03-20 DIAGNOSIS — Z7984 Long term (current) use of oral hypoglycemic drugs: Secondary | ICD-10-CM | POA: Insufficient documentation

## 2020-03-20 DIAGNOSIS — E119 Type 2 diabetes mellitus without complications: Secondary | ICD-10-CM | POA: Diagnosis not present

## 2020-03-20 DIAGNOSIS — Z8616 Personal history of COVID-19: Secondary | ICD-10-CM | POA: Diagnosis not present

## 2020-03-20 DIAGNOSIS — I4819 Other persistent atrial fibrillation: Secondary | ICD-10-CM | POA: Diagnosis not present

## 2020-03-20 HISTORY — PX: CARDIOVERSION: SHX1299

## 2020-03-20 SURGERY — CARDIOVERSION
Anesthesia: General

## 2020-03-20 MED ORDER — SODIUM CHLORIDE 0.9 % IV SOLN: INTRAVENOUS | Status: DC | PRN

## 2020-03-20 MED ORDER — LIDOCAINE HCL (CARDIAC) PF 100 MG/5ML IV SOSY
PREFILLED_SYRINGE | INTRAVENOUS | Status: DC | PRN
Start: 2020-03-20 — End: 2020-03-20
  Administered 2020-03-20: 80 mg via INTRATRACHEAL

## 2020-03-20 MED ORDER — PROPOFOL 500 MG/50ML IV EMUL
INTRAVENOUS | Status: DC | PRN
  Administered 2020-03-20: 80 mg via INTRAVENOUS

## 2020-03-20 NOTE — Transfer of Care (Signed)
Immediate Anesthesia Transfer of Care Note  Patient: Brett Matthews  Procedure(s) Performed: CARDIOVERSION (N/A )  Patient Location: Endoscopy Unit  Anesthesia Type:General  Level of Consciousness: awake, drowsy and patient cooperative  Airway & Oxygen Therapy: Patient Spontanous Breathing  Post-op Assessment: Report given to RN and Post -op Vital signs reviewed and stable  Post vital signs: Reviewed and stable  Last Vitals:  Vitals Value Taken Time  BP 113/63   Temp    Pulse 80   Resp 12   SpO2 92     Last Pain:  Vitals:   03/20/20 1056  TempSrc: Temporal  PainSc: 0-No pain         Complications: No complications documented.

## 2020-03-20 NOTE — CV Procedure (Signed)
Direct current cardioversion:  Indication symptomatic A. Fibrillation.  Procedure: Using 80 mg of IV Propofol and 80 IV Lidocaine (for reducing venous pain) for achieving deep sedation, synchronized direct current cardioversion performed. Patient was delivered with 120 Joules of electricity X 1 with success to NSR. Patient tolerated the procedure well. No immediate complication noted.     Adrian Prows, MD, [facility] 03/20/2020, 12:09 PM Office: [phone] Pager: [phone]

## 2020-03-20 NOTE — Anesthesia Preprocedure Evaluation (Addendum)
Anesthesia Evaluation  Patient identified by MRN, date of birth, ID band Patient awake    Reviewed: Allergy & Precautions, NPO status , Patient's Chart, lab work & pertinent test results  Airway Mallampati: II  TM Distance: >3 FB Neck ROM: Full    Dental  (+) Dental Advisory Given, Chipped   Pulmonary shortness of breath, pneumonia,    Pulmonary exam normal breath sounds clear to auscultation       Cardiovascular hypertension, Pt. on home beta blockers + CAD  Normal cardiovascular exam+ dysrhythmias Atrial Fibrillation  Rhythm:Regular Rate:Normal  Echocardiogram 02/02/2020:  Rhythm: Atrial Flutter.  Normal LV systolic function with visual EF 50-55%. Left ventricle cavity is normal in size. Normal global wall motion. Unable to evaluate diastolic function due to atrial flutter. Elevated LAP. Calculated EF 62%.  Left atrial cavity is mildly dilated.  Right atrial cavity is mildly dilated.  Mild (Grade I) mitral regurgitation.  Mild tricuspid regurgitation.  Compared to prior study dated 12/09/2018: Biatrial enlargement is new, LVEF 65% and now 50-555%, otherwise no significant change.    Neuro/Psych  Headaches, negative psych ROS   GI/Hepatic Neg liver ROS, GERD  Medicated,  Endo/Other  diabetes, Type 2, Oral Hypoglycemic AgentsHypothyroidism   Renal/GU negative Renal ROS     Musculoskeletal  (+) Arthritis ,   Abdominal   Peds  Hematology negative hematology ROS (+) anemia ,   Anesthesia Other Findings   Reproductive/Obstetrics                           Anesthesia Physical  Anesthesia Plan  ASA: III  Anesthesia Plan: General   Post-op Pain Management:    Induction:   PONV Risk Score and Plan: 2 and Propofol infusion, TIVA and Treatment may vary due to age or medical condition  Airway Management Planned: Natural Airway and Mask  Additional Equipment: None  Intra-op Plan:    Post-operative Plan:   Informed Consent: I have reviewed the patients History and Physical, chart, labs and discussed the procedure including the risks, benefits and alternatives for the proposed anesthesia with the patient or authorized representative who has indicated his/her understanding and acceptance.     Dental advisory given  Plan Discussed with: CRNA  Anesthesia Plan Comments:       Anesthesia Quick Evaluation

## 2020-03-20 NOTE — Discharge Instructions (Signed)

## 2020-03-20 NOTE — Interval H&P Note (Signed)
History and Physical Interval Note:  03/20/2020 11:54 AM  Brett Matthews  has presented today for surgery, with the diagnosis of AFIB.  The various methods of treatment have been discussed with the patient and family. After consideration of risks, benefits and other options for treatment, the patient has consented to  Procedure(s): CARDIOVERSION (N/A) as a surgical intervention.  The patient's history has been reviewed, patient examined, no change in status, stable for surgery.  I have reviewed the patient's chart and labs.  Questions were answered to the patient's satisfaction.     Adrian Prows

## 2020-03-20 NOTE — H&P (Addendum)
Primary Physician/Referring:  Glenda Chroman, MD  Patient ID: Brett Matthews, male    DOB: [birth year], 87 y.o.   MRN: [id number]    CC: Dyspnea and fatigue Atrial fibrillation  HPI:    Brett Matthews  is a 87 y.o.  Caucasian male with paroxysmal atrial fibrillation, hypertension, hyperlipidemia, controlled diabetes mellitus, moderate coronary artery disease in mid circumflex with 60% stenosis by angiography in 2013, last presentation on 01/16/2019 with chest pain and dyspnea SP direct-current cardioversion on 01/18/2019 and presently on flecainide and Xarelto.  He presents here for follow-up of hypertension and atrial fibrillation.  He was diagnosed with Covid pneumonia about 6 months ago, since then he has been vaccinated but states that he has been extremely fatigued and tired.  Denies palpitations or chest pain.  Dyspnea is remained stable.  States that his diabetes is now well controlled, tolerating anticoagulation without any bleeding diathesis.    On his last office visit 8 weeks ago, I had scheduled him for direct-current cardioversion in view of persistent atrial fibrillation.  Unfortunately this had to be postponed as he was COVID-19 positive and felt to be long hoarder.  He now presents for elective cardioversion.  No new symptomatology.  Past Medical History:  Diagnosis Date  . Anemia, iron deficiency    Negative capsule endoscopy  . Arthritis   . Coronary atherosclerosis of native coronary artery    60 % circumflex stenosis; EF 65%, Cath 6/13  . Diverticula of colon    Pancolonic  . DM (diabetes mellitus), type 2, uncontrolled ([place])   . Dyspnea    Normal cardiopulmonary function test in July 2008, negative echocardiogram with "bubble" study for inter-cardiac shunt.  . Essential hypertension, benign   . Gastroesophageal reflux disease   . Hemorrhoids   . Hyperplastic colon polyp 04/06/2007  . Hypothyroidism 2003   Status post total thyroidectomy  . Migraines   . Neoplasm  of lymphatic and hematopoietic tissue   . Paroxysmal atrial fibrillation ([place])    Diagnosed 2005  . Skin cancer    Past Surgical History:  Procedure Laterality Date  . BACK SURGERY    . BONE MARROW BIOPSY  1990's  . CARDIAC CATHETERIZATION    . CARDIOVERSION N/A 01/18/2019   Procedure: CARDIOVERSION;  Surgeon: Adrian Prows, MD;  Location: [facility] ENDOSCOPY;  Service: Cardiovascular;  Laterality: N/A;  . CATARACT EXTRACTION Bilateral   . COLONOSCOPY  04/06/2007   Dr. Gala Romney- Normal rectum with scattered pancolonic diverticula and slightly redundant elongated colon, diminutive polpy midsigmoid, remainder of colonic mucosa appeared normal. bx= hyperplastic polyp  . COLONOSCOPY N/A 11/29/2012   CZY:SAYTKZSW preparation. Friable anal canal/internal hemorrhoids; otherwise, normal rectum. Normal-appearing colonic mucosa  . COLONOSCOPY WITH PROPOFOL N/A 03/12/2017   Procedure: COLONOSCOPY WITH PROPOFOL;  Surgeon: Daneil Dolin, MD;  Location: [facility] ENDO SUITE;  Service: Endoscopy;  Laterality: N/A;  7:30am  . ESOPHAGOGASTRODUODENOSCOPY  04/06/2007   Dr. Gala Romney- normal esophagus s/p nissen fundoplication, intact nissen wrap o/w normal stomach  . ESOPHAGOGASTRODUODENOSCOPY N/A 11/29/2012   RMR: Abnormall distal esophagus bx c/w GERD. prior fundoplication. Gastric polyps  -bx benign. Status post biopsy of normal--appearing duodenal mucosa (bx neg)  . HEMORRHOID SURGERY N/A 04/06/2017   Procedure: EXTENSIVE HEMORRHOIDECTOMY;  Surgeon: Aviva Signs, MD;  Location: [facility] ORS;  Service: General;  Laterality: N/A;  . LEFT HEART CATHETERIZATION WITH CORONARY ANGIOGRAM N/A 06/27/2011   Procedure: LEFT HEART CATHETERIZATION WITH CORONARY ANGIOGRAM;  Surgeon: Thayer Headings, MD;  Location: [facility] CATH  LAB;  Service: Cardiovascular;  Laterality: N/A;  . NISSEN FUNDOPLICATION    . POLYPECTOMY  03/12/2017   Procedure: POLYPECTOMY;  Surgeon: Daneil Dolin, MD;  Location: [facility] ENDO SUITE;  Service: Endoscopy;;  cecal x3  . POSTERIOR  LAMINECTOMY / DECOMPRESSION LUMBAR SPINE  ~ 1990's  . SKIN CANCER EXCISION     "off my back and chest; from sun"   Family History  Problem Relation Age of Onset  . Cancer Other   . Stroke Other   . Hypertension Mother   . Stroke Mother   . Cancer Father   . Cancer Sister   . Stroke Brother   . Colon cancer Neg Hx    Social History   Tobacco Use  . Smoking status: Never Smoker  . Smokeless tobacco: Never Used  Substance Use Topics  . Alcohol use: No  Marital Status: Divorced  ROS  Review of Systems  Constitutional: Positive for malaise/fatigue.  Cardiovascular: Positive for dyspnea on exertion. Negative for chest pain and leg swelling.  Musculoskeletal: Positive for back pain and joint pain.  Gastrointestinal: Negative for melena.   Objective   Vitals with BMI 03/20/2020 03/06/2020 02/01/2020  Height $Remov'5\' 11"'NHaFba$  $RemoveB'5\' 11"'iVQZWUxd$  $RemoveBef'5\' 11"'ElfInfGUpD$   Weight 194 lbs 194 lbs 194 lbs 3 oz  BMI 27.07 35.36 14.4  Systolic 315 400 867  Diastolic 85 79 69  Pulse 619 89 88    Physical Exam Constitutional:      Appearance: Normal appearance.     Comments: He is well-built and well-nourished and appears younger than stated age.  Cardiovascular:     Rate and Rhythm: Normal rate. Rhythm irregularly irregular.     Pulses: Intact distal pulses.     Heart sounds: Normal heart sounds. No murmur heard. No gallop.      Comments: No leg edema, no JVD. Pulmonary:     Effort: Pulmonary effort is normal.     Breath sounds: Normal breath sounds.  Abdominal:     General: Bowel sounds are normal.     Palpations: Abdomen is soft.    Laboratory examination:   Recent Labs    08/20/19 0505 08/21/19 0651 08/22/19 0642 03/12/20 1259  NA 136 133* 134* 138  K 4.3 3.7 3.7 4.3  CL 100 100 101 102  CO2 $Re'26 24 26 22  'zOx$ GLUCOSE 211* 188* 214* 157*  BUN $Re'19 17 18 18  'yiY$ CREATININE 0.69 0.58* 0.67 0.79  CALCIUM 9.1 8.8* 8.8* 10.0  GFRNONAA >60 >60 >60  --   GFRAA >60 >60 >60  --    estimated creatinine clearance is  71.9 mL/min (by C-G formula based on SCr of 0.79 mg/dL).  CMP Latest Ref Rng & Units 03/12/2020 08/22/2019 08/21/2019  Glucose 65 - 99 mg/dL 157(H) 214(H) 188(H)  BUN 8 - 27 mg/dL $Remove'18 18 17  'VJsQziq$ Creatinine 0.76 - 1.27 mg/dL 0.79 0.67 0.58(L)  Sodium 134 - 144 mmol/L 138 134(L) 133(L)  Potassium 3.5 - 5.2 mmol/L 4.3 3.7 3.7  Chloride 96 - 106 mmol/L 102 101 100  CO2 20 - 29 mmol/L $RemoveB'22 26 24  'vRuODFED$ Calcium 8.6 - 10.2 mg/dL 10.0 8.8(L) 8.8(L)  Total Protein 6.5 - 8.1 g/dL - 5.4(L) 5.5(L)  Total Bilirubin 0.3 - 1.2 mg/dL - 0.5 0.4  Alkaline Phos 38 - 126 U/L - 65 64  AST 15 - 41 U/L - 14(L) 13(L)  ALT 0 - 44 U/L - 16 15   CBC Latest Ref Rng & Units 03/12/2020 08/22/2019 08/21/2019  WBC  3.4 - 10.8 x10E3/uL 6.1 6.7 6.6  Hemoglobin 13.0 - 17.7 g/dL 10.4(L) 11.1(L) 11.2(L)  Hematocrit 37.5 - 51.0 % 34.7(L) 35.2(L) 35.3(L)  Platelets 150 - 450 x10E3/uL 317 296 307   HEMOGLOBIN A1C Lab Results  Component Value Date   HGBA1C 7.6 (H) 08/18/2019   MPG 171.42 08/18/2019   External Labs:  02/15/2019: Glucose 217. Albumin 4.07, Potassium 4.4, Glucose 238 (H), Total Bilirubin 0.3, Protein, Total 6.2, eGFR >6.,  AST 14.4, ALT 16 , Chloride 97 (L), Creatinine 0.79, BUN 14, Alkaline Phosphatase 89.  11/08/2018: Serum glucose 149 mg, BUN 16, creatinine 0.89, eGFR >60 mL.  CBC normal.  11/05/2018: TSH 2.41 normal  12/08/2018: Total cholesterol 173, triglycerides 136, HDL 41, LDL 118. Serum glucose 167 mg, BUN 11, creatinine 0.99, eGFR >70 mL, potassium 3.6.  CMP otherwise normal.  Alkaline phosphatase minimally elevated at 01/07/20.  HB 12.8/HCT 39.9, platelets 297.  TSH normal. PSA mildly elevated.   Medications and allergies  No Known Allergies  No current facility-administered medications on file prior to encounter.   Current Outpatient Medications on File Prior to Encounter  Medication Sig Dispense Refill  . acetaminophen (TYLENOL) 500 MG tablet Take 1,000 mg by mouth every 8 (eight) hours as needed for  moderate pain.    Marland Kitchen albuterol (VENTOLIN HFA) 108 (90 Base) MCG/ACT inhaler Inhale 1-2 puffs into the lungs every 6 (six) hours as needed for wheezing or shortness of breath.    Marland Kitchen amLODipine (NORVASC) 10 MG tablet Take 10 mg by mouth daily at 12 noon.    Marland Kitchen atorvastatin (LIPITOR) 10 MG tablet Take 1 tablet (10 mg total) by mouth daily. 90 tablet 2  . benazepril (LOTENSIN) 40 MG tablet Take 0.5 tablets (20 mg total) by mouth daily. (Patient taking differently: Take 40 mg by mouth every evening.)    . carvedilol (COREG) 12.5 MG tablet Take 12.5 mg by mouth 2 (two) times daily with a meal.    . Cholecalciferol (D3-1000) 25 MCG (1000 UT) capsule Take 1,000 Units by mouth every evening.    . citalopram (CELEXA) 40 MG tablet Take 40 mg by mouth daily.  3  . clonazePAM (KLONOPIN) 1 MG tablet Take 1 mg by mouth at bedtime. For sleep    . diltiazem (CARDIZEM CD) 240 MG 24 hr capsule Take 240 mg by mouth every evening.    . docusate sodium (COLACE) 100 MG capsule Take 100 mg by mouth every evening.    . famotidine (PEPCID) 40 MG tablet Take 40 mg by mouth daily.    . fluticasone (FLONASE) 50 MCG/ACT nasal spray Place 1 spray into both nostrils daily as needed for allergies.    . folic acid (FOLVITE) 1 MG tablet Take 1 tablet by mouth daily at 12 noon.  2  . gabapentin (NEURONTIN) 300 MG capsule Take 300 mg by mouth 3 (three) times daily.    Marland Kitchen glimepiride (AMARYL) 2 MG tablet Take 2 mg by mouth 2 (two) times daily.  9  . Insulin Degludec (TRESIBA) 100 UNIT/ML SOLN Inject 16 Units into the skin daily.    Marland Kitchen levothyroxine (SYNTHROID) 125 MCG tablet Take 125 mcg by mouth daily at 2 am. SYNTHROID - brand name only    . LINZESS 290 MCG CAPS capsule Take 290 mcg by mouth every evening.    . Magnesium Hydroxide (DULCOLAX SOFT CHEWS) 1200 MG CHEW Chew 1,200 mg by mouth daily as needed (constipation).    . Melatonin 10 MG TABS Take 10 mg  by mouth at bedtime.    . metFORMIN (GLUCOPHAGE) 500 MG tablet Take 500-1,000 mg  by mouth See admin instructions. Take 2 tabs (1,$RemoveB'000mg'MFycooLv$ ) by mouth every morning & 1 tab ($Remo'500mg'vgyXW$ ) every evening    . pantoprazole (PROTONIX) 40 MG tablet Take 40 mg by mouth daily at 12 noon.    . polyethylene glycol (MIRALAX / GLYCOLAX) 17 g packet Take 17 g by mouth daily.    . tamsulosin (FLOMAX) 0.4 MG CAPS capsule Take 0.4 mg by mouth daily after breakfast.    . vitamin B-12 (CYANOCOBALAMIN) 1000 MCG tablet Take 1,000 mcg by mouth daily.    Alveda Reasons 15 MG TABS tablet Take 15 mg by mouth every evening.    . zinc gluconate 50 MG tablet Take 50 mg by mouth daily.    . isosorbide dinitrate (ISORDIL) 30 MG tablet TAKE 1 TABLET(30 MG) BY MOUTH THREE TIMES DAILY (Patient not taking: Reported on 03/08/2020) 90 tablet 2    Radiology:   Chest x-ray PA and lateral view 11/08/2018: Normal chest x-ray with no active lung disease.  Cardiac Studies:   Coronary  angiogram 06/27/2011: Mild left main disease, mid circumflex 60% stenosis.  Mild luminal irregularity of the RCA and normal LAD.  LVEF 65%.  Lexiscan Tetrofosmin Stress Test 12/27/2018: Nondiagnostic ECG stress. Normal myocardial perfusion. Stress LV EF: 58%.  No previous exam available for comparison. Low risk study.   Echocardiogram 12/09/2018: Left ventricle cavity is normal in size. Moderate concentric hypertrophy of the left ventricle. Normal LV systolic function with EF 65%. Normal global wall motion. Doppler evidence of grade I (impaired) diastolic dysfunction, normal LAP. Mild (Grade I) mitral regurgitation. Mild tricuspid regurgitation. Estimated pulmonary artery systolic pressure is 23 mmHg.  EKG     EKG 02/01/2020: Atypical atrial flutter with variable AV conduction at rate of 80 bpm, left axis deviation, left anterior fascicular block.  Right bundle branch block.  Low-voltage complexes.  Pulmonary disease pattern.    EKG 07/05/2019: Sinus bradycardia at the rate of 59 bpm, left atrial enlargement, left axis deviation, left anterior  fascicular block.  Poor R progression, cannot exclude anteroseptal infarct old.  IVCD, borderline criteria for LVH.     01/17/2019: Atypical atrial flutter with variable ventricular response at the rate of 110 bpm, left axis deviation, left anterior fascicular block.  IVCD.  Poor R-wave progression, cannot exclude anterolateral infarct old.  No evidence of ischemia.  Low-voltage limb leads, consider COPD.  Assessment  1.  Persistent atrial fibrillation.  Recommendations:   ITZAE MIRALLES  is a 87 y.o. Caucasian male with paroxysmal atrial fibrillation, hypertension, hyperlipidemia, controlled diabetes mellitus, moderate coronary artery disease in mid circumflex with 60% stenosis by angiography in 2013, last presentation on 01/16/2019 with chest pain and dyspnea SP direct-current cardioversion on 01/18/2019 and presently on flecainide and Xarelto.  He has persistent atrial fibrillation, in view of symptoms of fatigue and dyspnea, will proceed with direct-current cardioversion.  I had seen him in January 2022 however after having scheduled the cardioversion, retesting had revealed positive COVID-19.  Felt it was positive and felt patient was a long hoarder possibly.  No other new symptoms.  All questions answered.  Is willing to proceed with cardioversion.    Adrian Prows, MD, [facility] 03/20/2020, 11:50 AM Office: [phone]

## 2020-03-20 NOTE — Anesthesia Procedure Notes (Signed)
Procedure Name: MAC Date/Time: 03/20/2020 12:02 PM Performed by: Dorthea Cove, CRNA Pre-anesthesia Checklist: Patient identified, Emergency Drugs available, Suction available, Patient being monitored and Timeout performed Patient Re-evaluated:Patient Re-evaluated prior to induction Oxygen Delivery Method: Ambu bag Preoxygenation: Pre-oxygenation with 100% oxygen Induction Type: IV induction Ventilation: Mask ventilation without difficulty Placement Confirmation: positive ETCO2 and CO2 detector Dental Injury: Teeth and Oropharynx as per pre-operative assessment

## 2020-03-21 ENCOUNTER — Encounter (HOSPITAL_COMMUNITY): Payer: Self-pay | Admitting: Cardiology

## 2020-03-21 NOTE — Anesthesia Postprocedure Evaluation (Signed)
Anesthesia Post Note  Patient: Brett Matthews  Procedure(s) Performed: CARDIOVERSION (N/A )     Patient location during evaluation: Endoscopy Anesthesia Type: General Level of consciousness: sedated and patient cooperative Pain management: pain level controlled Vital Signs Assessment: post-procedure vital signs reviewed and stable Respiratory status: spontaneous breathing Cardiovascular status: stable Anesthetic complications: no   No complications documented.  Last Vitals:  Vitals:   03/20/20 1216 03/20/20 1226  BP: 113/63 121/66  Pulse: 80 82  Resp: 17 18  Temp: 36.4 C   SpO2: 93% 97%    Last Pain:  Vitals:   03/20/20 1226  TempSrc:   PainSc: 0-No pain                 Nolon Nations

## 2020-03-22 DIAGNOSIS — J069 Acute upper respiratory infection, unspecified: Secondary | ICD-10-CM | POA: Diagnosis not present

## 2020-03-22 DIAGNOSIS — Z299 Encounter for prophylactic measures, unspecified: Secondary | ICD-10-CM | POA: Diagnosis not present

## 2020-03-22 DIAGNOSIS — D692 Other nonthrombocytopenic purpura: Secondary | ICD-10-CM | POA: Diagnosis not present

## 2020-03-22 DIAGNOSIS — M170 Bilateral primary osteoarthritis of knee: Secondary | ICD-10-CM | POA: Diagnosis not present

## 2020-03-22 DIAGNOSIS — S32010A Wedge compression fracture of first lumbar vertebra, initial encounter for closed fracture: Secondary | ICD-10-CM | POA: Diagnosis not present

## 2020-04-24 DIAGNOSIS — M25552 Pain in left hip: Secondary | ICD-10-CM | POA: Diagnosis not present

## 2020-04-24 DIAGNOSIS — F32A Depression, unspecified: Secondary | ICD-10-CM | POA: Diagnosis not present

## 2020-04-24 DIAGNOSIS — E1165 Type 2 diabetes mellitus with hyperglycemia: Secondary | ICD-10-CM | POA: Diagnosis not present

## 2020-04-24 DIAGNOSIS — Z299 Encounter for prophylactic measures, unspecified: Secondary | ICD-10-CM | POA: Diagnosis not present

## 2020-04-24 DIAGNOSIS — I10 Essential (primary) hypertension: Secondary | ICD-10-CM | POA: Diagnosis not present

## 2020-04-26 DIAGNOSIS — M79676 Pain in unspecified toe(s): Secondary | ICD-10-CM | POA: Diagnosis not present

## 2020-04-26 DIAGNOSIS — B351 Tinea unguium: Secondary | ICD-10-CM | POA: Diagnosis not present

## 2020-04-27 DIAGNOSIS — M25552 Pain in left hip: Secondary | ICD-10-CM | POA: Diagnosis not present

## 2020-04-30 ENCOUNTER — Ambulatory Visit: Payer: PPO | Admitting: Cardiology

## 2020-04-30 ENCOUNTER — Other Ambulatory Visit: Payer: Self-pay

## 2020-04-30 ENCOUNTER — Encounter: Payer: Self-pay | Admitting: Cardiology

## 2020-04-30 VITALS — BP 128/80 | HR 73 | Temp 98.3°F | Resp 16 | Ht 71.0 in | Wt 195.0 lb

## 2020-04-30 DIAGNOSIS — I10 Essential (primary) hypertension: Secondary | ICD-10-CM

## 2020-04-30 DIAGNOSIS — I480 Paroxysmal atrial fibrillation: Secondary | ICD-10-CM

## 2020-04-30 NOTE — Progress Notes (Signed)
Primary Physician/Referring:  Glenda Chroman, MD  Patient ID: Brett Matthews, male    DOB: [birth year], 87 y.o.   MRN: [id number]   Chief Complaint  Patient presents with  . Post Cardioversion  . Follow-up    4 week  . Atrial Fibrillation   HPI:    Brett Matthews  is a 87 y.o.  Caucasian male with paroxysmal atrial fibrillation, hypertension, hyperlipidemia, controlled diabetes mellitus, moderate coronary artery disease in mid circumflex with 60% stenosis by angiography in 2013, last presentation on 01/16/2019 with chest pain and dyspnea SP direct-current cardioversion on 01/18/2019. Flecainide discontinued due to bradycardia. Due to recurrence of symptomatic atrial fibrillation underwent direct-current cardioversion on 03/20/2020 and converted to sinus rhythm with 1 shock.  He now presents for follow-up.  He is currently doing well, states that he has not had any recurrence of symptoms of chest pain or dyspnea.  He has not had any further episodes of palpitations.  He is tolerating anticoagulation without bleeding diathesis. He did not bring his medications with him today.  Past Medical History:  Diagnosis Date  . Anemia, iron deficiency    Negative capsule endoscopy  . Arthritis   . Coronary atherosclerosis of native coronary artery    60 % circumflex stenosis; EF 65%, Cath 6/13  . Diverticula of colon    Pancolonic  . DM (diabetes mellitus), type 2, uncontrolled ([place])   . Dyspnea    Normal cardiopulmonary function test in July 2008, negative echocardiogram with "bubble" study for inter-cardiac shunt.  . Essential hypertension, benign   . Gastroesophageal reflux disease   . Hemorrhoids   . Hyperplastic colon polyp 04/06/2007  . Hypothyroidism 2003   Status post total thyroidectomy  . Migraines   . Neoplasm of lymphatic and hematopoietic tissue   . Paroxysmal atrial fibrillation ([place])    Diagnosed 2005  . Skin cancer    Past Surgical History:  Procedure Laterality Date  .  BACK SURGERY    . BONE MARROW BIOPSY  1990's  . CARDIAC CATHETERIZATION    . CARDIOVERSION N/A 01/18/2019   Procedure: CARDIOVERSION;  Surgeon: Adrian Prows, MD;  Location: [facility] ENDOSCOPY;  Service: Cardiovascular;  Laterality: N/A;  . CARDIOVERSION N/A 03/20/2020   Procedure: CARDIOVERSION;  Surgeon: Adrian Prows, MD;  Location: [facility] ENDOSCOPY;  Service: Cardiovascular;  Laterality: N/A;  . CATARACT EXTRACTION Bilateral   . COLONOSCOPY  04/06/2007   Dr. Gala Romney- Normal rectum with scattered pancolonic diverticula and slightly redundant elongated colon, diminutive polpy midsigmoid, remainder of colonic mucosa appeared normal. bx= hyperplastic polyp  . COLONOSCOPY N/A 11/29/2012   ZHY:QMVHQION preparation. Friable anal canal/internal hemorrhoids; otherwise, normal rectum. Normal-appearing colonic mucosa  . COLONOSCOPY WITH PROPOFOL N/A 03/12/2017   Procedure: COLONOSCOPY WITH PROPOFOL;  Surgeon: Daneil Dolin, MD;  Location: [facility] ENDO SUITE;  Service: Endoscopy;  Laterality: N/A;  7:30am  . ESOPHAGOGASTRODUODENOSCOPY  04/06/2007   Dr. Gala Romney- normal esophagus s/p nissen fundoplication, intact nissen wrap o/w normal stomach  . ESOPHAGOGASTRODUODENOSCOPY N/A 11/29/2012   RMR: Abnormall distal esophagus bx c/w GERD. prior fundoplication. Gastric polyps  -bx benign. Status post biopsy of normal--appearing duodenal mucosa (bx neg)  . HEMORRHOID SURGERY N/A 04/06/2017   Procedure: EXTENSIVE HEMORRHOIDECTOMY;  Surgeon: Aviva Signs, MD;  Location: [facility] ORS;  Service: General;  Laterality: N/A;  . LEFT HEART CATHETERIZATION WITH CORONARY ANGIOGRAM N/A 06/27/2011   Procedure: LEFT HEART CATHETERIZATION WITH CORONARY ANGIOGRAM;  Surgeon: Thayer Headings, MD;  Location: [facility] CATH LAB;  Service: Cardiovascular;  Laterality: N/A;  . NISSEN FUNDOPLICATION    . POLYPECTOMY  03/12/2017   Procedure: POLYPECTOMY;  Surgeon: Daneil Dolin, MD;  Location: [facility] ENDO SUITE;  Service: Endoscopy;;  cecal x3  . POSTERIOR LAMINECTOMY /  DECOMPRESSION LUMBAR SPINE  ~ 1990's  . SKIN CANCER EXCISION     "off my back and chest; from sun"   Family History  Problem Relation Age of Onset  . Cancer Other   . Stroke Other   . Hypertension Mother   . Stroke Mother   . Cancer Father   . Cancer Sister   . Stroke Brother   . Colon cancer Neg Hx    Social History   Tobacco Use  . Smoking status: Never Smoker  . Smokeless tobacco: Never Used  Substance Use Topics  . Alcohol use: No  Marital Status: Divorced  ROS  Review of Systems  Constitutional: Negative for malaise/fatigue.  Cardiovascular: Negative for chest pain, dyspnea on exertion and leg swelling.  Musculoskeletal: Positive for back pain and joint pain.  Gastrointestinal: Negative for melena.   Objective   Vitals with BMI 04/30/2020 03/20/2020 03/20/2020  Height $Remov'5\' 11"'cQWYuu$  - -  Weight 195 lbs - -  BMI 27.51 - -  Systolic 700 174 944  Diastolic 80 66 63  Pulse 73 82 80    Physical Exam Constitutional:      Appearance: Normal appearance.     Comments: He is well-built and well-nourished and appears younger than stated age.  Cardiovascular:     Rate and Rhythm: Normal rate and regular rhythm.     Pulses: Intact distal pulses.     Heart sounds: Normal heart sounds. No murmur heard. No gallop.      Comments: No leg edema, no JVD. Pulmonary:     Effort: Pulmonary effort is normal.     Breath sounds: Normal breath sounds.  Abdominal:     General: Bowel sounds are normal.     Palpations: Abdomen is soft.  Skin:    Capillary Refill: Capillary refill takes less than 2 seconds.    Laboratory examination:   Recent Labs    08/20/19 0505 08/21/19 0651 08/22/19 0642 03/12/20 1259  NA 136 133* 134* 138  K 4.3 3.7 3.7 4.3  CL 100 100 101 102  CO2 $Re'26 24 26 22  'dGC$ GLUCOSE 211* 188* 214* 157*  BUN $Re'19 17 18 18  'Pqs$ CREATININE 0.69 0.58* 0.67 0.79  CALCIUM 9.1 8.8* 8.8* 10.0  GFRNONAA >60 >60 >60  --   GFRAA >60 >60 >60  --    CrCl cannot be calculated  (Patient's most recent lab result is older than the maximum 21 days allowed.).  CMP Latest Ref Rng & Units 03/12/2020 08/22/2019 08/21/2019  Glucose 65 - 99 mg/dL 157(H) 214(H) 188(H)  BUN 8 - 27 mg/dL $Remove'18 18 17  'qQxhFdB$ Creatinine 0.76 - 1.27 mg/dL 0.79 0.67 0.58(L)  Sodium 134 - 144 mmol/L 138 134(L) 133(L)  Potassium 3.5 - 5.2 mmol/L 4.3 3.7 3.7  Chloride 96 - 106 mmol/L 102 101 100  CO2 20 - 29 mmol/L $RemoveB'22 26 24  'WhsNQVWi$ Calcium 8.6 - 10.2 mg/dL 10.0 8.8(L) 8.8(L)  Total Protein 6.5 - 8.1 g/dL - 5.4(L) 5.5(L)  Total Bilirubin 0.3 - 1.2 mg/dL - 0.5 0.4  Alkaline Phos 38 - 126 U/L - 65 64  AST 15 - 41 U/L - 14(L) 13(L)  ALT 0 - 44 U/L - 16 15   CBC Latest Ref Rng & Units 03/12/2020 08/22/2019  08/21/2019  WBC 3.4 - 10.8 x10E3/uL 6.1 6.7 6.6  Hemoglobin 13.0 - 17.7 g/dL 10.4(L) 11.1(L) 11.2(L)  Hematocrit 37.5 - 51.0 % 34.7(L) 35.2(L) 35.3(L)  Platelets 150 - 450 x10E3/uL 317 296 307   HEMOGLOBIN A1C Lab Results  Component Value Date   HGBA1C 7.6 (H) 08/18/2019   MPG 171.42 08/18/2019   External Labs:  02/15/2019: Glucose 217. Albumin 4.07, Potassium 4.4, Glucose 238 (H), Total Bilirubin 0.3, Protein, Total 6.2, eGFR >6.,  AST 14.4, ALT 16 , Chloride 97 (L), Creatinine 0.79, BUN 14, Alkaline Phosphatase 89.  11/08/2018: Serum glucose 149 mg, BUN 16, creatinine 0.89, eGFR >60 mL.  CBC normal.  11/05/2018: TSH 2.41 normal  12/08/2018: Total cholesterol 173, triglycerides 136, HDL 41, LDL 118. Serum glucose 167 mg, BUN 11, creatinine 0.99, eGFR >70 mL, potassium 3.6.  CMP otherwise normal.  Alkaline phosphatase minimally elevated at 01/07/20.  HB 12.8/HCT 39.9, platelets 297.  TSH normal. PSA mildly elevated.   Medications and allergies  No Known Allergies  Current Outpatient Medications on File Prior to Visit  Medication Sig Dispense Refill  . acetaminophen (TYLENOL) 500 MG tablet Take 1,000 mg by mouth every 8 (eight) hours as needed for moderate pain.    Marland Kitchen albuterol (VENTOLIN HFA) 108 (90 Base)  MCG/ACT inhaler Inhale 1-2 puffs into the lungs every 6 (six) hours as needed for wheezing or shortness of breath.    Marland Kitchen amLODipine (NORVASC) 10 MG tablet Take 10 mg by mouth daily at 12 noon.    Marland Kitchen atorvastatin (LIPITOR) 10 MG tablet Take 1 tablet (10 mg total) by mouth daily. 90 tablet 2  . benazepril (LOTENSIN) 40 MG tablet Take 0.5 tablets (20 mg total) by mouth daily. (Patient taking differently: Take 40 mg by mouth every evening.)    . carvedilol (COREG) 12.5 MG tablet Take 12.5 mg by mouth 2 (two) times daily with a meal.    . Cholecalciferol (D3-1000) 25 MCG (1000 UT) capsule Take 1,000 Units by mouth every evening.    . citalopram (CELEXA) 40 MG tablet Take 40 mg by mouth daily.  3  . clonazePAM (KLONOPIN) 1 MG tablet Take 1 mg by mouth at bedtime. For sleep    . docusate sodium (COLACE) 100 MG capsule Take 100 mg by mouth every evening.    . famotidine (PEPCID) 40 MG tablet Take 40 mg by mouth daily.    . fluticasone (FLONASE) 50 MCG/ACT nasal spray Place 1 spray into both nostrils daily as needed for allergies.    . folic acid (FOLVITE) 1 MG tablet Take 1 tablet by mouth daily at 12 noon.  2  . gabapentin (NEURONTIN) 300 MG capsule Take 300 mg by mouth 3 (three) times daily.    Marland Kitchen glimepiride (AMARYL) 2 MG tablet Take 2 mg by mouth 2 (two) times daily.  9  . Insulin Degludec (TRESIBA) 100 UNIT/ML SOLN Inject 16 Units into the skin daily.    Marland Kitchen levothyroxine (SYNTHROID) 125 MCG tablet Take 125 mcg by mouth daily at 2 am. SYNTHROID - brand name only    . LINZESS 290 MCG CAPS capsule Take 290 mcg by mouth every evening.    . Magnesium Hydroxide (DULCOLAX SOFT CHEWS) 1200 MG CHEW Chew 1,200 mg by mouth daily as needed (constipation).    . Melatonin 10 MG TABS Take 10 mg by mouth at bedtime.    . metFORMIN (GLUCOPHAGE) 500 MG tablet Take 500-1,000 mg by mouth See admin instructions. Take 2 tabs (1,$RemoveB'000mg'vwyqBPfB$ ) by mouth  every morning & 1 tab ($Remo'500mg'tqfGl$ ) every evening    . pantoprazole (PROTONIX) 40 MG  tablet Take 40 mg by mouth daily at 12 noon.    . polyethylene glycol (MIRALAX / GLYCOLAX) 17 g packet Take 17 g by mouth daily.    . tamsulosin (FLOMAX) 0.4 MG CAPS capsule Take 0.4 mg by mouth daily after breakfast.    . vitamin B-12 (CYANOCOBALAMIN) 1000 MCG tablet Take 1,000 mcg by mouth daily.    Alveda Reasons 15 MG TABS tablet Take 15 mg by mouth every evening.    . zinc gluconate 50 MG tablet Take 50 mg by mouth daily.     No current facility-administered medications on file prior to visit.    Radiology:   Chest x-ray PA and lateral view 11/08/2018: Normal chest x-ray with no active lung disease.  Cardiac Studies:   Coronary  angiogram 06/27/2011: Mild left main disease, mid circumflex 60% stenosis.  Mild luminal irregularity of the RCA and normal LAD.  LVEF 65%.  Lexiscan Tetrofosmin Stress Test 12/27/2018: Nondiagnostic ECG stress. Normal myocardial perfusion. Stress LV EF: 58%.  No previous exam available for comparison. Low risk study.   Echocardiogram 12/09/2018: Left ventricle cavity is normal in size. Moderate concentric hypertrophy of the left ventricle. Normal LV systolic function with EF 65%. Normal global wall motion. Doppler evidence of grade I (impaired) diastolic dysfunction, normal LAP. Mild (Grade I) mitral regurgitation. Mild tricuspid regurgitation. Estimated pulmonary artery systolic pressure is 23 mmHg.  EKG    EKG 04/30/2020: Normal sinus rhythm at rate of 65 bpm, left axis deviation, left intrafascicular block.  Right bundle branch block.  Bifascicular block.  Nonspecific T abnormality.    EKG 02/01/2020: Atypical atrial flutter with variable AV conduction at rate of 80 bpm, left axis deviation, left anterior fascicular block.  Right bundle branch block.  Low-voltage complexes.  Pulmonary disease pattern.    EKG 07/05/2019: Sinus bradycardia at the rate of 59 bpm, left atrial enlargement, left axis deviation, left anterior fascicular block.  Poor R progression,  cannot exclude anteroseptal infarct old.  IVCD, borderline criteria for LVH.     Assessment     ICD-10-CM   1. Paroxysmal atrial fibrillation (HCC)  I48.0 EKG 12-Lead  2. Essential hypertension, benign  I10     CHA2DS2-VASc Score is 4.  Yearly risk of stroke: 4.8% (A, HTN, CAD).  Score of 1=0.6; 2=2.2; 3=3.2; 4=4.8; 5=7.2; 6=9.8; 7=>9.8) -(CHF; HTN; vasc disease DM,  Male = 1; Age <65 =0; 65-74 = 1,  >75 =2; stroke/embolism= 2).   No orders of the defined types were placed in this encounter.  Medications Discontinued During This Encounter  Medication Reason  . diltiazem (CARDIZEM CD) 240 MG 24 hr capsule Discontinued by provider   Recommendations:   Brett Matthews  is a 87 y.o. Caucasian male with paroxysmal atrial fibrillation, hypertension, hyperlipidemia, controlled diabetes mellitus, moderate coronary artery disease in mid circumflex with 60% stenosis by angiography in 2013, last presentation on 01/16/2019 with chest pain and dyspnea SP direct-current cardioversion on 01/18/2019 and flecainide was discontinued due to bradycardia. Due to recurrence of symptomatic atrial fibrillation underwent direct-current cardioversion on 03/20/2020 and converted to sinus rhythm with 1 shock.  He now presents for follow-up.  He is maintaining sinus rhythm, he has not had any further episodes of dyspnea or palpitations or chest pain.  Blood pressure is also well controlled on present medical regimen however he is on amlodipine and also on diltiazem, advised him  to discontinue diltiazem.  Otherwise no changes in the medications were done today.  I like to see him back in 6 months for follow-up.    Adrian Prows, MD, [facility] 04/30/2020, 3:03 PM Office: [phone]

## 2020-05-01 ENCOUNTER — Other Ambulatory Visit: Payer: Self-pay | Admitting: Physician Assistant

## 2020-05-01 ENCOUNTER — Other Ambulatory Visit: Payer: Self-pay | Admitting: Orthopedic Surgery

## 2020-05-01 DIAGNOSIS — M25552 Pain in left hip: Secondary | ICD-10-CM

## 2020-05-02 DIAGNOSIS — E1165 Type 2 diabetes mellitus with hyperglycemia: Secondary | ICD-10-CM | POA: Diagnosis not present

## 2020-05-02 DIAGNOSIS — Z299 Encounter for prophylactic measures, unspecified: Secondary | ICD-10-CM | POA: Diagnosis not present

## 2020-05-02 DIAGNOSIS — E1142 Type 2 diabetes mellitus with diabetic polyneuropathy: Secondary | ICD-10-CM | POA: Diagnosis not present

## 2020-05-02 DIAGNOSIS — I10 Essential (primary) hypertension: Secondary | ICD-10-CM | POA: Diagnosis not present

## 2020-05-02 DIAGNOSIS — J3489 Other specified disorders of nose and nasal sinuses: Secondary | ICD-10-CM | POA: Diagnosis not present

## 2020-05-03 ENCOUNTER — Other Ambulatory Visit: Payer: Self-pay | Admitting: Orthopedic Surgery

## 2020-05-03 DIAGNOSIS — M5416 Radiculopathy, lumbar region: Secondary | ICD-10-CM

## 2020-05-04 DIAGNOSIS — B0229 Other postherpetic nervous system involvement: Secondary | ICD-10-CM | POA: Diagnosis not present

## 2020-05-04 DIAGNOSIS — I10 Essential (primary) hypertension: Secondary | ICD-10-CM | POA: Diagnosis not present

## 2020-05-04 DIAGNOSIS — Z299 Encounter for prophylactic measures, unspecified: Secondary | ICD-10-CM | POA: Diagnosis not present

## 2020-05-05 DIAGNOSIS — I10 Essential (primary) hypertension: Secondary | ICD-10-CM | POA: Diagnosis not present

## 2020-05-05 DIAGNOSIS — M170 Bilateral primary osteoarthritis of knee: Secondary | ICD-10-CM | POA: Diagnosis not present

## 2020-05-13 ENCOUNTER — Ambulatory Visit
Admission: RE | Admit: 2020-05-13 | Discharge: 2020-05-13 | Disposition: A | Discharge status: Home / Self Care | Payer: PPO | Source: Ambulatory Visit | Attending: Orthopedic Surgery | Admitting: Orthopedic Surgery

## 2020-05-13 ENCOUNTER — Other Ambulatory Visit: Payer: Self-pay

## 2020-05-13 DIAGNOSIS — M48061 Spinal stenosis, lumbar region without neurogenic claudication: Secondary | ICD-10-CM | POA: Diagnosis not present

## 2020-05-13 DIAGNOSIS — M545 Low back pain, unspecified: Secondary | ICD-10-CM | POA: Diagnosis not present

## 2020-05-13 DIAGNOSIS — M1612 Unilateral primary osteoarthritis, left hip: Secondary | ICD-10-CM | POA: Diagnosis not present

## 2020-05-13 DIAGNOSIS — M5416 Radiculopathy, lumbar region: Secondary | ICD-10-CM

## 2020-05-13 DIAGNOSIS — M25552 Pain in left hip: Secondary | ICD-10-CM

## 2020-05-18 DIAGNOSIS — M25552 Pain in left hip: Secondary | ICD-10-CM | POA: Diagnosis not present

## 2020-05-21 DIAGNOSIS — M170 Bilateral primary osteoarthritis of knee: Secondary | ICD-10-CM | POA: Diagnosis not present

## 2020-05-31 DIAGNOSIS — M170 Bilateral primary osteoarthritis of knee: Secondary | ICD-10-CM | POA: Diagnosis not present

## 2020-05-31 DIAGNOSIS — M25462 Effusion, left knee: Secondary | ICD-10-CM | POA: Diagnosis not present

## 2020-05-31 DIAGNOSIS — M7052 Other bursitis of knee, left knee: Secondary | ICD-10-CM | POA: Diagnosis not present

## 2020-06-07 DIAGNOSIS — M170 Bilateral primary osteoarthritis of knee: Secondary | ICD-10-CM | POA: Diagnosis not present

## 2020-07-02 DIAGNOSIS — D6869 Other thrombophilia: Secondary | ICD-10-CM | POA: Diagnosis not present

## 2020-07-02 DIAGNOSIS — I480 Paroxysmal atrial fibrillation: Secondary | ICD-10-CM | POA: Diagnosis not present

## 2020-07-02 DIAGNOSIS — G63 Polyneuropathy in diseases classified elsewhere: Secondary | ICD-10-CM | POA: Diagnosis not present

## 2020-07-02 DIAGNOSIS — I739 Peripheral vascular disease, unspecified: Secondary | ICD-10-CM | POA: Diagnosis not present

## 2020-07-02 DIAGNOSIS — F3340 Major depressive disorder, recurrent, in remission, unspecified: Secondary | ICD-10-CM | POA: Diagnosis not present

## 2020-07-05 DIAGNOSIS — M170 Bilateral primary osteoarthritis of knee: Secondary | ICD-10-CM | POA: Diagnosis not present

## 2020-07-05 DIAGNOSIS — I10 Essential (primary) hypertension: Secondary | ICD-10-CM | POA: Diagnosis not present

## 2020-07-12 DIAGNOSIS — M79676 Pain in unspecified toe(s): Secondary | ICD-10-CM | POA: Diagnosis not present

## 2020-07-12 DIAGNOSIS — B351 Tinea unguium: Secondary | ICD-10-CM | POA: Diagnosis not present

## 2020-07-30 DIAGNOSIS — I10 Essential (primary) hypertension: Secondary | ICD-10-CM | POA: Diagnosis not present

## 2020-07-30 DIAGNOSIS — Z713 Dietary counseling and surveillance: Secondary | ICD-10-CM | POA: Diagnosis not present

## 2020-07-30 DIAGNOSIS — E1142 Type 2 diabetes mellitus with diabetic polyneuropathy: Secondary | ICD-10-CM | POA: Diagnosis not present

## 2020-07-30 DIAGNOSIS — D692 Other nonthrombocytopenic purpura: Secondary | ICD-10-CM | POA: Diagnosis not present

## 2020-07-30 DIAGNOSIS — E1151 Type 2 diabetes mellitus with diabetic peripheral angiopathy without gangrene: Secondary | ICD-10-CM | POA: Diagnosis not present

## 2020-07-30 DIAGNOSIS — E1159 Type 2 diabetes mellitus with other circulatory complications: Secondary | ICD-10-CM | POA: Diagnosis not present

## 2020-07-30 DIAGNOSIS — F3340 Major depressive disorder, recurrent, in remission, unspecified: Secondary | ICD-10-CM | POA: Diagnosis not present

## 2020-07-30 DIAGNOSIS — M170 Bilateral primary osteoarthritis of knee: Secondary | ICD-10-CM | POA: Diagnosis not present

## 2020-07-30 DIAGNOSIS — G63 Polyneuropathy in diseases classified elsewhere: Secondary | ICD-10-CM | POA: Diagnosis not present

## 2020-07-30 DIAGNOSIS — Z7901 Long term (current) use of anticoagulants: Secondary | ICD-10-CM | POA: Diagnosis not present

## 2020-07-30 DIAGNOSIS — I480 Paroxysmal atrial fibrillation: Secondary | ICD-10-CM | POA: Diagnosis not present

## 2020-07-30 DIAGNOSIS — Z299 Encounter for prophylactic measures, unspecified: Secondary | ICD-10-CM | POA: Diagnosis not present

## 2020-07-30 DIAGNOSIS — E1165 Type 2 diabetes mellitus with hyperglycemia: Secondary | ICD-10-CM | POA: Diagnosis not present

## 2020-07-30 DIAGNOSIS — D6869 Other thrombophilia: Secondary | ICD-10-CM | POA: Diagnosis not present

## 2020-08-06 ENCOUNTER — Encounter: Payer: Self-pay | Admitting: Internal Medicine

## 2020-08-08 DIAGNOSIS — M25562 Pain in left knee: Secondary | ICD-10-CM | POA: Diagnosis not present

## 2020-08-08 DIAGNOSIS — M25561 Pain in right knee: Secondary | ICD-10-CM | POA: Diagnosis not present

## 2020-08-14 DIAGNOSIS — M25561 Pain in right knee: Secondary | ICD-10-CM | POA: Diagnosis not present

## 2020-08-14 DIAGNOSIS — M25562 Pain in left knee: Secondary | ICD-10-CM | POA: Diagnosis not present

## 2020-08-24 DIAGNOSIS — M25561 Pain in right knee: Secondary | ICD-10-CM | POA: Diagnosis not present

## 2020-08-24 DIAGNOSIS — M25562 Pain in left knee: Secondary | ICD-10-CM | POA: Diagnosis not present

## 2020-09-03 DIAGNOSIS — I10 Essential (primary) hypertension: Secondary | ICD-10-CM | POA: Diagnosis not present

## 2020-09-03 DIAGNOSIS — L98499 Non-pressure chronic ulcer of skin of other sites with unspecified severity: Secondary | ICD-10-CM | POA: Diagnosis not present

## 2020-09-03 DIAGNOSIS — W19XXXA Unspecified fall, initial encounter: Secondary | ICD-10-CM | POA: Diagnosis not present

## 2020-09-03 DIAGNOSIS — D692 Other nonthrombocytopenic purpura: Secondary | ICD-10-CM | POA: Diagnosis not present

## 2020-09-03 DIAGNOSIS — Z299 Encounter for prophylactic measures, unspecified: Secondary | ICD-10-CM | POA: Diagnosis not present

## 2020-09-04 DIAGNOSIS — H40013 Open angle with borderline findings, low risk, bilateral: Secondary | ICD-10-CM | POA: Diagnosis not present

## 2020-09-04 DIAGNOSIS — Z961 Presence of intraocular lens: Secondary | ICD-10-CM | POA: Diagnosis not present

## 2020-09-04 DIAGNOSIS — E119 Type 2 diabetes mellitus without complications: Secondary | ICD-10-CM | POA: Diagnosis not present

## 2020-09-04 DIAGNOSIS — H353132 Nonexudative age-related macular degeneration, bilateral, intermediate dry stage: Secondary | ICD-10-CM | POA: Diagnosis not present

## 2020-09-13 DIAGNOSIS — I10 Essential (primary) hypertension: Secondary | ICD-10-CM | POA: Diagnosis not present

## 2020-09-13 DIAGNOSIS — E1151 Type 2 diabetes mellitus with diabetic peripheral angiopathy without gangrene: Secondary | ICD-10-CM | POA: Diagnosis not present

## 2020-09-13 DIAGNOSIS — D692 Other nonthrombocytopenic purpura: Secondary | ICD-10-CM | POA: Diagnosis not present

## 2020-09-13 DIAGNOSIS — G63 Polyneuropathy in diseases classified elsewhere: Secondary | ICD-10-CM | POA: Diagnosis not present

## 2020-09-13 DIAGNOSIS — M170 Bilateral primary osteoarthritis of knee: Secondary | ICD-10-CM | POA: Diagnosis not present

## 2020-09-13 DIAGNOSIS — Z87891 Personal history of nicotine dependence: Secondary | ICD-10-CM | POA: Diagnosis not present

## 2020-09-13 DIAGNOSIS — E1159 Type 2 diabetes mellitus with other circulatory complications: Secondary | ICD-10-CM | POA: Diagnosis not present

## 2020-09-13 DIAGNOSIS — Z7901 Long term (current) use of anticoagulants: Secondary | ICD-10-CM | POA: Diagnosis not present

## 2020-09-13 DIAGNOSIS — M25552 Pain in left hip: Secondary | ICD-10-CM | POA: Diagnosis not present

## 2020-09-17 DIAGNOSIS — M545 Low back pain, unspecified: Secondary | ICD-10-CM | POA: Diagnosis not present

## 2020-09-17 DIAGNOSIS — I4891 Unspecified atrial fibrillation: Secondary | ICD-10-CM | POA: Diagnosis not present

## 2020-09-17 DIAGNOSIS — Z299 Encounter for prophylactic measures, unspecified: Secondary | ICD-10-CM | POA: Diagnosis not present

## 2020-09-17 DIAGNOSIS — H9222 Otorrhagia, left ear: Secondary | ICD-10-CM | POA: Diagnosis not present

## 2020-09-17 DIAGNOSIS — F1320 Sedative, hypnotic or anxiolytic dependence, uncomplicated: Secondary | ICD-10-CM | POA: Diagnosis not present

## 2020-09-20 DIAGNOSIS — M79676 Pain in unspecified toe(s): Secondary | ICD-10-CM | POA: Diagnosis not present

## 2020-09-20 DIAGNOSIS — B351 Tinea unguium: Secondary | ICD-10-CM | POA: Diagnosis not present

## 2020-10-02 DIAGNOSIS — M5416 Radiculopathy, lumbar region: Secondary | ICD-10-CM | POA: Diagnosis not present

## 2020-10-18 DIAGNOSIS — M5416 Radiculopathy, lumbar region: Secondary | ICD-10-CM | POA: Diagnosis not present

## 2020-10-23 DIAGNOSIS — M5416 Radiculopathy, lumbar region: Secondary | ICD-10-CM | POA: Diagnosis not present

## 2020-10-24 DIAGNOSIS — M25562 Pain in left knee: Secondary | ICD-10-CM | POA: Diagnosis not present

## 2020-10-30 DIAGNOSIS — M1712 Unilateral primary osteoarthritis, left knee: Secondary | ICD-10-CM | POA: Diagnosis not present

## 2020-10-31 ENCOUNTER — Ambulatory Visit: Payer: PPO | Admitting: Cardiology

## 2020-11-02 ENCOUNTER — Encounter: Payer: Self-pay | Admitting: Cardiology

## 2020-11-02 ENCOUNTER — Other Ambulatory Visit: Payer: Self-pay

## 2020-11-02 ENCOUNTER — Ambulatory Visit: Payer: PPO | Admitting: Cardiology

## 2020-11-02 VITALS — BP 178/100 | HR 64 | Temp 97.9°F | Resp 17 | Ht 71.0 in | Wt 200.8 lb

## 2020-11-02 DIAGNOSIS — I2510 Atherosclerotic heart disease of native coronary artery without angina pectoris: Secondary | ICD-10-CM

## 2020-11-02 DIAGNOSIS — I10 Essential (primary) hypertension: Secondary | ICD-10-CM | POA: Diagnosis not present

## 2020-11-02 DIAGNOSIS — I480 Paroxysmal atrial fibrillation: Secondary | ICD-10-CM

## 2020-11-02 MED ORDER — BENAZEPRIL HCL 20 MG PO TABS: 20.0000 mg | ORAL_TABLET | Freq: Every day | ORAL | 3 refills | Status: DC

## 2020-11-02 NOTE — Progress Notes (Addendum)
Primary Physician/Referring:  Glenda Chroman, MD  Patient ID: Brett Matthews, male    DOB: [birth year], 87 y.o.   MRN: [id number]   Chief Complaint  Patient presents with   Hypertension   Atrial Fibrillation    6 MONTH   HPI:    Brett Matthews  is a 87 y.o.  Caucasian male with paroxysmal atrial fibrillation, hypertension, hyperlipidemia, controlled diabetes mellitus, moderate coronary artery disease in mid circumflex with 60% stenosis by angiography in 2013, last presentation on 01/16/2019 with chest pain and dyspnea SP direct-current cardioversion on 01/18/2019. Flecainide discontinued due to bradycardia. Due to recurrence of symptomatic atrial fibrillation underwent direct-current cardioversion on 03/20/2020 and converted to sinus rhythm with 1 shock.    Patient presents for 6 month follow up. At last office visit patient was stable from a cardiovascular standpoint, therefore no changes were made.  Patient states he is doing well overall.  He has had no recurrence of palpitations.  He is tolerating anticoagulation without bleeding diathesis.  Denies chest pain, dyspnea, syncope, near syncope, dizziness.   Patient is unclear as to what medications he is taking and unfortunately did not bring medications with him today.  Patient's blood pressure is elevated in the office today, however it was previously well controlled.  Suspect patient may be missing medications.  He is accompanied by a friend today who agrees to call our office and do medication reconciliation over the phone living at home.  Past Medical History:  Diagnosis Date   Anemia, iron deficiency    Negative capsule endoscopy   Arthritis    Coronary atherosclerosis of native coronary artery    60 % circumflex stenosis; EF 65%, Cath 6/13   Diverticula of colon    Pancolonic   DM (diabetes mellitus), type 2, uncontrolled    Dyspnea    Normal cardiopulmonary function test in July 2008, negative echocardiogram with "bubble" study  for inter-cardiac shunt.   Essential hypertension, benign    Gastroesophageal reflux disease    Hemorrhoids    Hyperplastic colon polyp 04/06/2007   Hypothyroidism 2003   Status post total thyroidectomy   Migraines    Neoplasm of lymphatic and hematopoietic tissue    Paroxysmal atrial fibrillation ([place])    Diagnosed 2005   Skin cancer    Past Surgical History:  Procedure Laterality Date   BACK SURGERY     BONE MARROW BIOPSY  1990's   CARDIAC CATHETERIZATION     CARDIOVERSION N/A 01/18/2019   Procedure: CARDIOVERSION;  Surgeon: Adrian Prows, MD;  Location: [facility];  Service: Cardiovascular;  Laterality: N/A;   CARDIOVERSION N/A 03/20/2020   Procedure: CARDIOVERSION;  Surgeon: Adrian Prows, MD;  Location: [facility];  Service: Cardiovascular;  Laterality: N/A;   CATARACT EXTRACTION Bilateral    COLONOSCOPY  04/06/2007   Dr. Gala Romney- Normal rectum with scattered pancolonic diverticula and slightly redundant elongated colon, diminutive polpy midsigmoid, remainder of colonic mucosa appeared normal. bx= hyperplastic polyp   COLONOSCOPY N/A 11/29/2012   OHY:WVPXTGGY preparation. Friable anal canal/internal hemorrhoids; otherwise, normal rectum. Normal-appearing colonic mucosa   COLONOSCOPY WITH PROPOFOL N/A 03/12/2017   Procedure: COLONOSCOPY WITH PROPOFOL;  Surgeon: Daneil Dolin, MD;  Location: [facility];  Service: Endoscopy;  Laterality: N/A;  7:30am   ESOPHAGOGASTRODUODENOSCOPY  04/06/2007   Dr. Gala Romney- normal esophagus s/p nissen fundoplication, intact nissen wrap o/w normal stomach   ESOPHAGOGASTRODUODENOSCOPY N/A 11/29/2012   RMR: Abnormall distal esophagus bx c/w GERD. prior fundoplication. Gastric polyps  -bx benign.  Status post biopsy of normal--appearing duodenal mucosa (bx neg)   HEMORRHOID SURGERY N/A 04/06/2017   Procedure: EXTENSIVE HEMORRHOIDECTOMY;  Surgeon: Aviva Signs, MD;  Location: [facility];  Service: General;  Laterality: N/A;   LEFT HEART CATHETERIZATION WITH CORONARY  ANGIOGRAM N/A 06/27/2011   Procedure: LEFT HEART CATHETERIZATION WITH CORONARY ANGIOGRAM;  Surgeon: Thayer Headings, MD;  [facility];  Service: Cardiovascular;  Laterality: N/A;   NISSEN FUNDOPLICATION     POLYPECTOMY  03/12/2017   Procedure: POLYPECTOMY;  Surgeon: Daneil Dolin, MD;  Location: [facility];  Service: Endoscopy;;  cecal x3   POSTERIOR LAMINECTOMY / DECOMPRESSION LUMBAR SPINE  ~ 1990's   SKIN CANCER EXCISION     "off my back and chest; from sun"   Family History  Problem Relation Age of Onset   Hypertension Mother    Stroke Mother    Cancer Father    Cancer Sister    Stroke Brother    Cancer Other    Stroke Other    Colon cancer Neg Hx    Social History   Tobacco Use   Smoking status: Never   Smokeless tobacco: Never  Substance Use Topics   Alcohol use: No  Marital Status: Divorced  ROS  Review of Systems  Constitutional: Negative for malaise/fatigue and weight gain.  Cardiovascular:  Negative for chest pain, claudication, dyspnea on exertion, leg swelling, near-syncope, orthopnea, palpitations, paroxysmal nocturnal dyspnea and syncope.  Respiratory:  Negative for shortness of breath.   Musculoskeletal:  Positive for back pain and joint pain.  Gastrointestinal:  Negative for melena.  Neurological:  Negative for dizziness.  Objective   Vitals with BMI 11/02/2020 11/02/2020 11/02/2020  Height - - -  Weight - - -  BMI - - -  Systolic 315 176 160  Diastolic 737 106 95  Pulse 64 64 64    Physical Exam Vitals reviewed.  Constitutional:      Appearance: Normal appearance.     Comments: He is well-built and well-nourished and appears younger than stated age.  HENT:     Head: Normocephalic and atraumatic.  Cardiovascular:     Rate and Rhythm: Normal rate and regular rhythm.     Pulses: Intact distal pulses.     Heart sounds: Normal heart sounds, S1 normal and S2 normal. No murmur heard.   No gallop.     Comments: No leg edema, no  JVD. Pulmonary:     Effort: Pulmonary effort is normal. No respiratory distress.     Breath sounds: Normal breath sounds. No wheezing, rhonchi or rales.  Abdominal:     General: Bowel sounds are normal.     Palpations: Abdomen is soft.  Musculoskeletal:     Right lower leg: No edema.     Left lower leg: No edema.  Skin:    Capillary Refill: Capillary refill takes less than 2 seconds.  Neurological:     Mental Status: He is alert.   Laboratory examination:   Recent Labs    03/12/20 1259  NA 138  K 4.3  CL 102  CO2 22  GLUCOSE 157*  BUN 18  CREATININE 0.79  CALCIUM 10.0   CrCl cannot be calculated (Patient's most recent lab result is older than the maximum 21 days allowed.).  CMP Latest Ref Rng & Units 03/12/2020 08/22/2019 08/21/2019  Glucose 65 - 99 mg/dL 157(H) 214(H) 188(H)  BUN 8 - 27 mg/dL $Remove'18 18 17  'rPcTEgb$ Creatinine 0.76 - 1.27 mg/dL 0.79 0.67  0.58(L)  Sodium 134 - 144 mmol/L 138 134(L) 133(L)  Potassium 3.5 - 5.2 mmol/L 4.3 3.7 3.7  Chloride 96 - 106 mmol/L 102 101 100  CO2 20 - 29 mmol/L $RemoveB'22 26 24  'yGlXfDpq$ Calcium 8.6 - 10.2 mg/dL 10.0 8.8(L) 8.8(L)  Total Protein 6.5 - 8.1 g/dL - 5.4(L) 5.5(L)  Total Bilirubin 0.3 - 1.2 mg/dL - 0.5 0.4  Alkaline Phos 38 - 126 U/L - 65 64  AST 15 - 41 U/L - 14(L) 13(L)  ALT 0 - 44 U/L - 16 15   CBC Latest Ref Rng & Units 03/12/2020 08/22/2019 08/21/2019  WBC 3.4 - 10.8 x10E3/uL 6.1 6.7 6.6  Hemoglobin 13.0 - 17.7 g/dL 10.4(L) 11.1(L) 11.2(L)  Hematocrit 37.5 - 51.0 % 34.7(L) 35.2(L) 35.3(L)  Platelets 150 - 450 x10E3/uL 317 296 307   HEMOGLOBIN A1C Lab Results  Component Value Date   HGBA1C 7.6 (H) 08/18/2019   MPG 171.42 08/18/2019   External Labs:  02/15/2019: Glucose 217. Albumin 4.07, Potassium 4.4, Glucose 238 (H), Total Bilirubin 0.3, Protein, Total 6.2, eGFR >6.,  AST 14.4, ALT 16 , Chloride 97 (L), Creatinine 0.79, BUN 14, Alkaline Phosphatase 89.  11/08/2018: Serum glucose 149 mg, BUN 16, creatinine 0.89, eGFR >60 mL.  CBC  normal.  11/05/2018: TSH 2.41 normal  12/08/2018: Total cholesterol 173, triglycerides 136, HDL 41, LDL 118. Serum glucose 167 mg, BUN 11, creatinine 0.99, eGFR >70 mL, potassium 3.6.  CMP otherwise normal.  Alkaline phosphatase minimally elevated at 01/07/20.  HB 12.8/HCT 39.9, platelets 297.  TSH normal. PSA mildly elevated.   Allergies  No Known Allergies   Medications Prior to Visit:   Outpatient Medications Prior to Visit  Medication Sig Dispense Refill   acetaminophen (TYLENOL) 500 MG tablet Take 1,000 mg by mouth every 8 (eight) hours as needed for moderate pain.     albuterol (VENTOLIN HFA) 108 (90 Base) MCG/ACT inhaler Inhale 1-2 puffs into the lungs every 6 (six) hours as needed for wheezing or shortness of breath.     amLODipine (NORVASC) 10 MG tablet Take 10 mg by mouth daily at 12 noon.     atorvastatin (LIPITOR) 10 MG tablet Take 1 tablet (10 mg total) by mouth daily. 90 tablet 2   benazepril (LOTENSIN) 40 MG tablet Take 0.5 tablets (20 mg total) by mouth daily. (Patient taking differently: Take 40 mg by mouth every evening.)     carvedilol (COREG) 12.5 MG tablet Take 12.5 mg by mouth 2 (two) times daily with a meal.     Cholecalciferol (D3-1000) 25 MCG (1000 UT) capsule Take 1,000 Units by mouth every evening.     citalopram (CELEXA) 40 MG tablet Take 40 mg by mouth daily.  3   clonazePAM (KLONOPIN) 1 MG tablet Take 1 mg by mouth at bedtime. For sleep     diltiazem (CARDIZEM CD) 240 MG 24 hr capsule Take 240 mg by mouth daily.     docusate sodium (COLACE) 100 MG capsule Take 100 mg by mouth every evening.     famotidine (PEPCID) 40 MG tablet Take 40 mg by mouth daily.     fluticasone (FLONASE) 50 MCG/ACT nasal spray Place 1 spray into both nostrils daily as needed for allergies.     folic acid (FOLVITE) 1 MG tablet Take 1 tablet by mouth daily at 12 noon.  2   gabapentin (NEURONTIN) 300 MG capsule Take 300 mg by mouth 3 (three) times daily.     glimepiride (AMARYL) 2 MG  tablet Take 2 mg by mouth 2 (two) times daily.  9   Insulin Degludec (TRESIBA) 100 UNIT/ML SOLN Inject 18 Units into the skin daily.     levothyroxine (SYNTHROID) 125 MCG tablet Take 125 mcg by mouth daily at 2 am. SYNTHROID - brand name only     LINZESS 290 MCG CAPS capsule Take 290 mcg by mouth every evening.     Magnesium Hydroxide (DULCOLAX SOFT CHEWS) 1200 MG CHEW Chew 1,200 mg by mouth daily as needed (constipation).     Melatonin 10 MG TABS Take 10 mg by mouth at bedtime.     metFORMIN (GLUCOPHAGE) 500 MG tablet Take 500-1,000 mg by mouth See admin instructions. Take 2 tabs (1,$RemoveB'000mg'hTdESKuK$ ) by mouth every morning & 1 tab ($Remo'500mg'BVzVZ$ ) every evening     Omega-3 Fatty Acids (FISH OIL) 500 MG CAPS Take 3 capsules by mouth daily.     pantoprazole (PROTONIX) 40 MG tablet Take 40 mg by mouth daily at 12 noon.     polyethylene glycol (MIRALAX / GLYCOLAX) 17 g packet Take 17 g by mouth daily.     tamsulosin (FLOMAX) 0.4 MG CAPS capsule Take 0.4 mg by mouth daily after breakfast.     vitamin B-12 (CYANOCOBALAMIN) 1000 MCG tablet Take 1,000 mcg by mouth daily.     XARELTO 15 MG TABS tablet Take 15 mg by mouth every evening.     zinc gluconate 50 MG tablet Take 50 mg by mouth daily.     No facility-administered medications prior to visit.   Final Medications at End of Visit    No outpatient medications have been marked as taking for the 11/02/20 encounter (Office Visit) with Adrian Prows, MD.    Radiology:   Chest x-ray PA and lateral view 11/08/2018: Normal chest x-ray with no active lung disease.  Cardiac Studies:   Coronary  angiogram 06/27/2011: Mild left main disease, mid circumflex 60% stenosis.  Mild luminal irregularity of the RCA and normal LAD.  LVEF 65%.  Lexiscan Tetrofosmin Stress Test 12/27/2018: Nondiagnostic ECG stress. Normal myocardial perfusion. Stress LV EF: 58%.  No previous exam available for comparison. Low risk study.   Echocardiogram 12/09/2018: Left ventricle cavity is normal  in size. Moderate concentric hypertrophy of the left ventricle. Normal LV systolic function with EF 65%. Normal global wall motion. Doppler evidence of grade I (impaired) diastolic dysfunction, normal LAP. Mild (Grade I) mitral regurgitation. Mild tricuspid regurgitation. Estimated pulmonary artery systolic pressure is 23 mmHg.   EKG   EKG 11/02/2020: Normal sinus rhythm at rate of 63 bpm, left axis deviation, left anterior fascicular block.  Right bundle branch block.  T wave abnormality, cannot exclude inferior ischemia. Normal QT interval.  No significant change from 04/30/2020.  EKG 02/01/2020: Atypical atrial flutter with variable AV conduction at rate of 80 bpm, left axis deviation, left anterior fascicular block.  Right bundle branch block.  Low-voltage complexes.  Pulmonary disease pattern.      Assessment     ICD-10-CM   1. Paroxysmal atrial fibrillation (HCC)  I48.0 EKG 12-Lead    2. Essential hypertension, benign  I10     3. Coronary artery disease involving native coronary artery of native heart without angina pectoris  I25.10       CHA2DS2-VASc Score is 4.  Yearly risk of stroke: 4.8% (A, HTN, CAD).  Score of 1=0.6; 2=2.2; 3=3.2; 4=4.8; 5=7.2; 6=9.8; 7=>9.8) -(CHF; HTN; vasc disease DM,  Male = 1; Age <65 =0; 65-74 = 1,  >75 =2;  stroke/embolism= 2).   No orders of the defined types were placed in this encounter.  There are no discontinued medications.  Recommendations:   ANIK WESCH  is a 87 y.o. Caucasian male with paroxysmal atrial fibrillation, hypertension, hyperlipidemia, controlled diabetes mellitus, moderate coronary artery disease in mid circumflex with 60% stenosis by angiography in 2013, last presentation on 01/16/2019 with chest pain and dyspnea SP direct-current cardioversion on 01/18/2019 and flecainide was discontinued due to bradycardia. Due to recurrence of symptomatic atrial fibrillation underwent direct-current cardioversion on 03/20/2020 and converted  to sinus rhythm with 1 shock.    Patient presents for 43-month follow-up of atrial fibrillation and hypertension.  Patient continues to maintain sinus rhythm and is relatively asymptomatic.  He is tolerating anticoagulation without bleeding diathesis.  Regard to hypertension, patient's blood pressure is uncontrolled in the office today and he does not have home readings available.  Suspect medication noncompliance, therefore have requested that patient and his friend call back to to complete medication reconciliation over the phone when they get home.  Patient may need additional antihypertensive medications, however hold off at this time as it is unclear what he is presently taking.  Follow-up in 3 months, sooner if needed, for hypertension.   Alethia Berthold, PA-C 11/02/2020, 1:07 PM Office: [phone]  Addendum 10/25/2020: Medication reconciliation was completed and patient has not been taking amlodipine or benazepril.  Advised the patient resume both amlodipine 10 mg daily and benazepril 20 milligrams daily.  We will repeat BMP in 1 week.   Alethia Berthold, PA-C 11/02/2020, 3:24 PM Office: [phone]

## 2020-11-02 NOTE — Addendum Note (Signed)
Addended by: Rosezena Sensor on: 11/02/2020 03:32 PM   Modules accepted: Orders

## 2020-11-05 DIAGNOSIS — E1165 Type 2 diabetes mellitus with hyperglycemia: Secondary | ICD-10-CM | POA: Diagnosis not present

## 2020-11-05 DIAGNOSIS — I10 Essential (primary) hypertension: Secondary | ICD-10-CM | POA: Diagnosis not present

## 2020-11-05 DIAGNOSIS — M170 Bilateral primary osteoarthritis of knee: Secondary | ICD-10-CM | POA: Diagnosis not present

## 2020-11-05 DIAGNOSIS — Z299 Encounter for prophylactic measures, unspecified: Secondary | ICD-10-CM | POA: Diagnosis not present

## 2020-11-05 DIAGNOSIS — E1142 Type 2 diabetes mellitus with diabetic polyneuropathy: Secondary | ICD-10-CM | POA: Diagnosis not present

## 2020-11-05 DIAGNOSIS — Z6829 Body mass index (BMI) 29.0-29.9, adult: Secondary | ICD-10-CM | POA: Diagnosis not present

## 2020-11-08 DIAGNOSIS — M1712 Unilateral primary osteoarthritis, left knee: Secondary | ICD-10-CM | POA: Diagnosis not present

## 2020-11-09 DIAGNOSIS — M1712 Unilateral primary osteoarthritis, left knee: Secondary | ICD-10-CM | POA: Diagnosis not present

## 2020-11-12 DIAGNOSIS — I700 Atherosclerosis of aorta: Secondary | ICD-10-CM | POA: Diagnosis not present

## 2020-11-12 DIAGNOSIS — I4891 Unspecified atrial fibrillation: Secondary | ICD-10-CM | POA: Diagnosis not present

## 2020-11-12 DIAGNOSIS — I10 Essential (primary) hypertension: Secondary | ICD-10-CM | POA: Diagnosis not present

## 2020-11-12 DIAGNOSIS — Z299 Encounter for prophylactic measures, unspecified: Secondary | ICD-10-CM | POA: Diagnosis not present

## 2020-11-12 DIAGNOSIS — J0110 Acute frontal sinusitis, unspecified: Secondary | ICD-10-CM | POA: Diagnosis not present

## 2020-11-13 DIAGNOSIS — I10 Essential (primary) hypertension: Secondary | ICD-10-CM | POA: Diagnosis not present

## 2020-11-18 DIAGNOSIS — I10 Essential (primary) hypertension: Secondary | ICD-10-CM | POA: Diagnosis not present

## 2020-11-23 DIAGNOSIS — Z299 Encounter for prophylactic measures, unspecified: Secondary | ICD-10-CM | POA: Diagnosis not present

## 2020-11-23 DIAGNOSIS — I700 Atherosclerosis of aorta: Secondary | ICD-10-CM | POA: Diagnosis not present

## 2020-11-23 DIAGNOSIS — M1712 Unilateral primary osteoarthritis, left knee: Secondary | ICD-10-CM | POA: Diagnosis not present

## 2020-11-23 DIAGNOSIS — F1320 Sedative, hypnotic or anxiolytic dependence, uncomplicated: Secondary | ICD-10-CM | POA: Diagnosis not present

## 2020-11-23 DIAGNOSIS — E1142 Type 2 diabetes mellitus with diabetic polyneuropathy: Secondary | ICD-10-CM | POA: Diagnosis not present

## 2020-11-23 DIAGNOSIS — I10 Essential (primary) hypertension: Secondary | ICD-10-CM | POA: Diagnosis not present

## 2020-11-26 DIAGNOSIS — I10 Essential (primary) hypertension: Secondary | ICD-10-CM | POA: Diagnosis not present

## 2020-12-03 ENCOUNTER — Other Ambulatory Visit: Payer: Self-pay | Admitting: Orthopedic Surgery

## 2020-12-03 DIAGNOSIS — M25562 Pain in left knee: Secondary | ICD-10-CM

## 2020-12-06 DIAGNOSIS — L84 Corns and callosities: Secondary | ICD-10-CM | POA: Diagnosis not present

## 2020-12-06 DIAGNOSIS — E1142 Type 2 diabetes mellitus with diabetic polyneuropathy: Secondary | ICD-10-CM | POA: Diagnosis not present

## 2020-12-06 DIAGNOSIS — M79676 Pain in unspecified toe(s): Secondary | ICD-10-CM | POA: Diagnosis not present

## 2020-12-06 DIAGNOSIS — B351 Tinea unguium: Secondary | ICD-10-CM | POA: Diagnosis not present

## 2020-12-09 IMAGING — MR MR LUMBAR SPINE W/O CM
4 of 5 series · 25 of 48 positions shown · non-contrast
Comparison: CT myelogram dated 09/26/2009 and lumbar MRI dated
08/09/2018

CLINICAL DATA: Mid and low back pain for 1 year. Left lower
extremity radiculopathy.

EXAM:
MRI LUMBAR SPINE WITHOUT CONTRAST
TECHNIQUE: Multiplanar, multisequence MR imaging of the lumbar spine was
performed. No intravenous contrast was administered.

[Series 2: T2 · sagittal · 4.0mm · 0.55mm/px · 6 of 16 slices shown (1 of 2)]
[im 1/16]
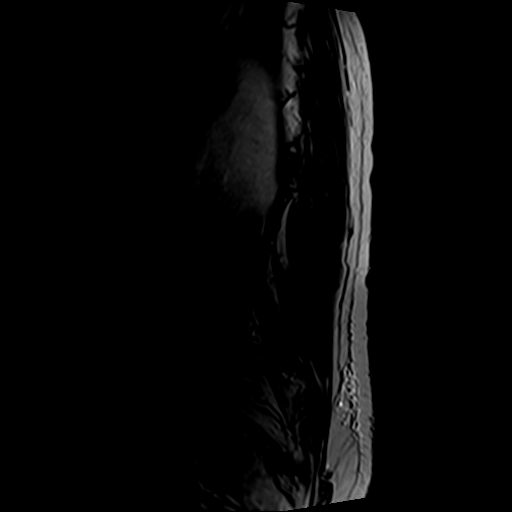
[im 4/16]
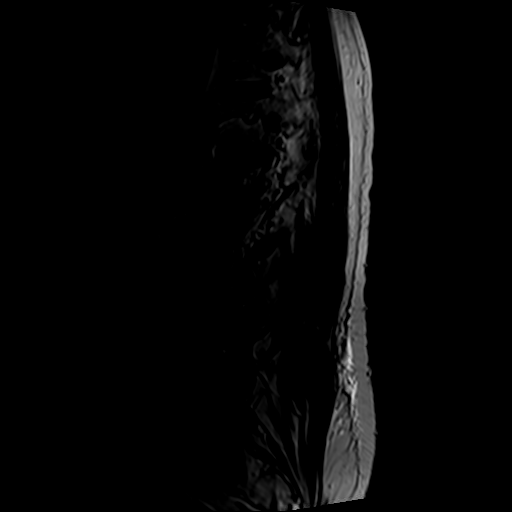
[im 7/16]
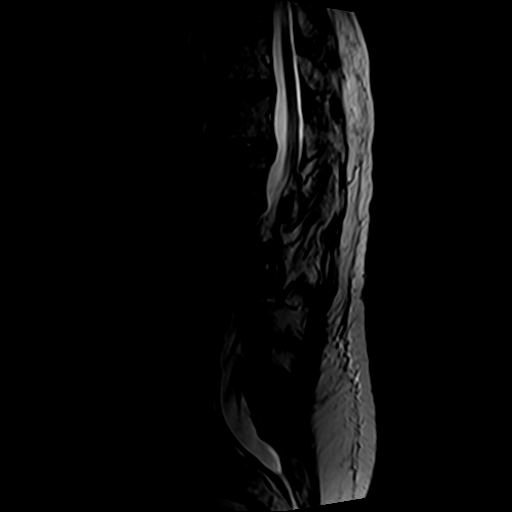
[im 10/16]
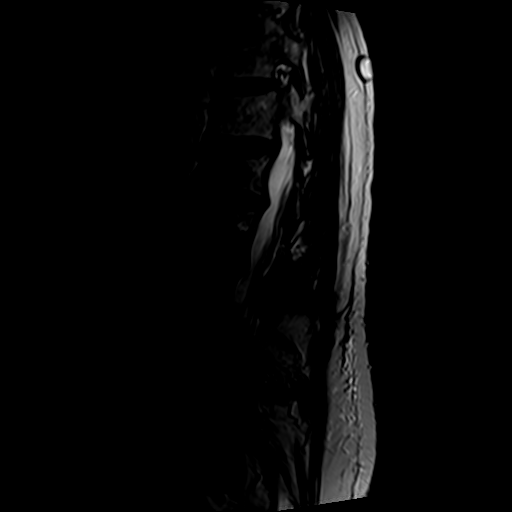
[im 13/16]
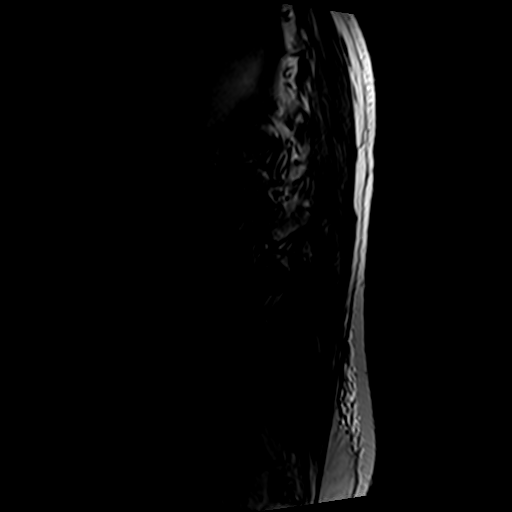
[im 16/16]
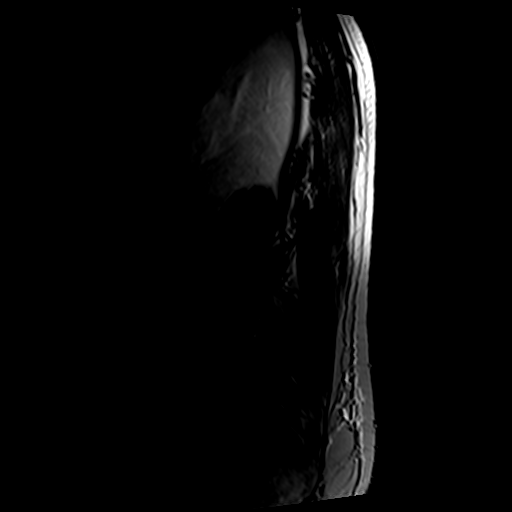

[Series 4: T1 · sagittal · 4.0mm · 0.55mm/px · 6 of 16 slices shown (1 of 2)]
[im 1/16]
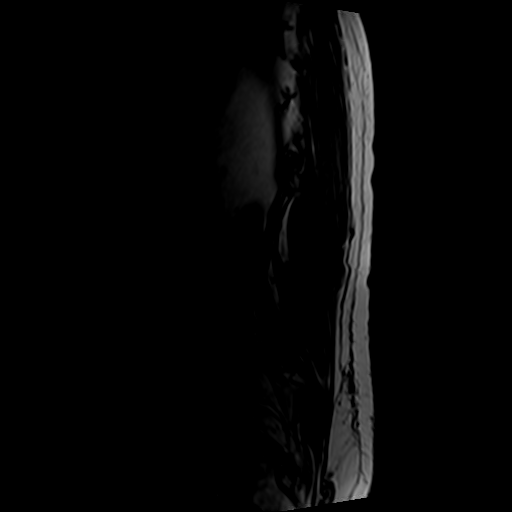
[im 4/16]
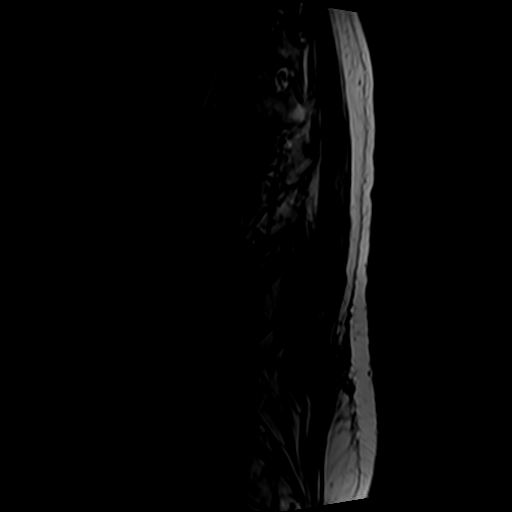
[im 7/16]
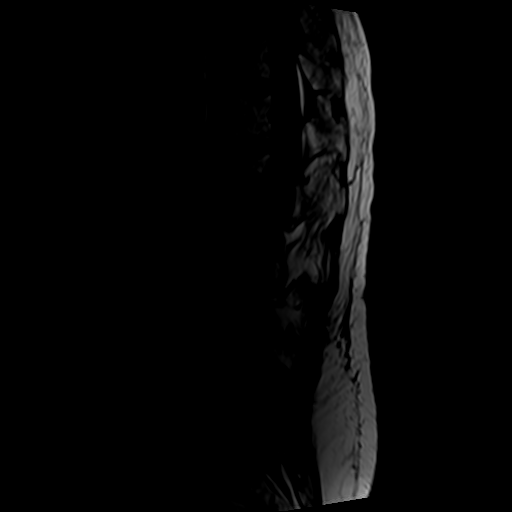
[im 10/16]
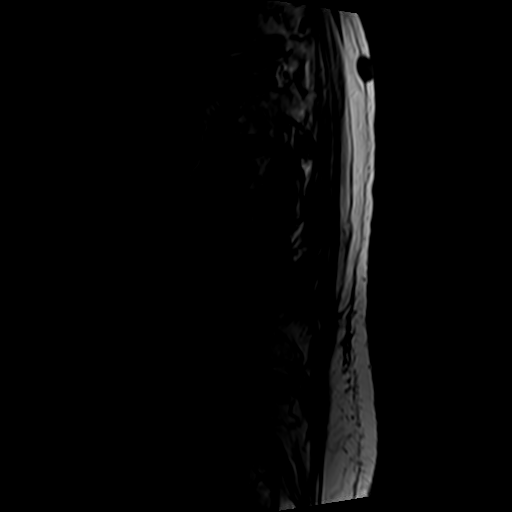
[im 13/16]
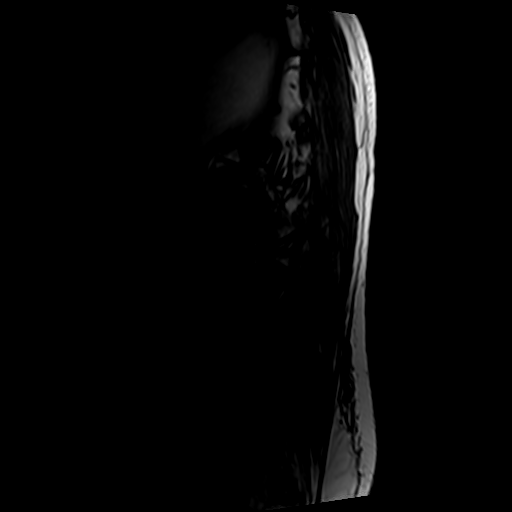
[im 16/16]
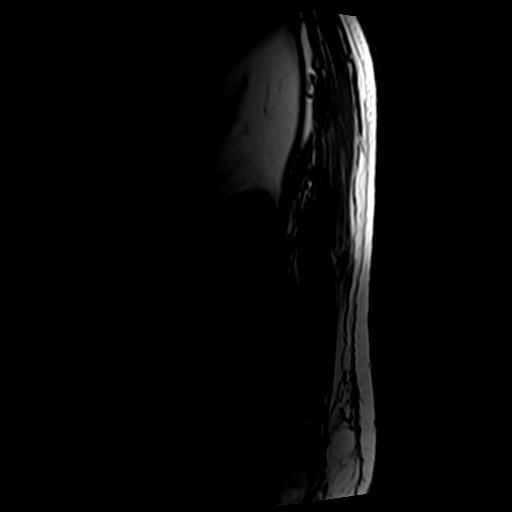

[Series 5: T2 · axial · 4.0mm · 0.70mm/px · z∈[-73,+144]mm · 9 of 42 slices shown (2 of 2)]
[im 1/42]
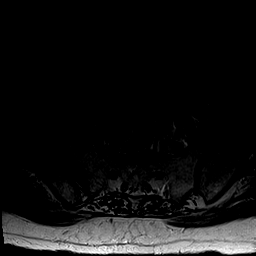
[im 6/42]
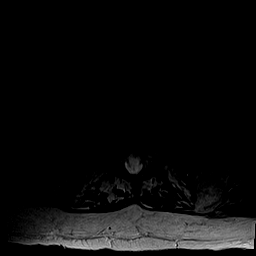
[im 12/42]
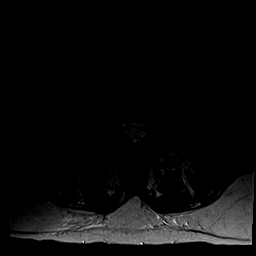
[im 18/42]
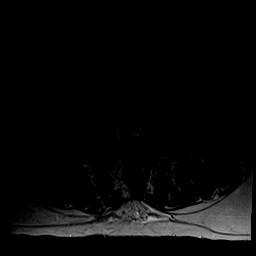
[im 21/42]
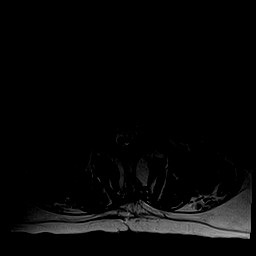
[im 24/42]
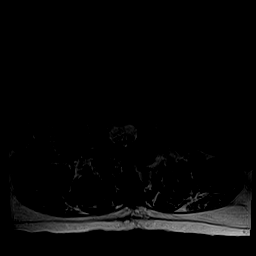
[im 30/42]
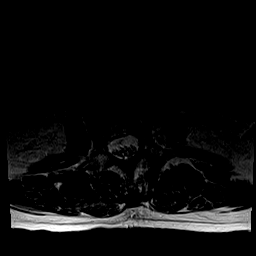
[im 36/42]
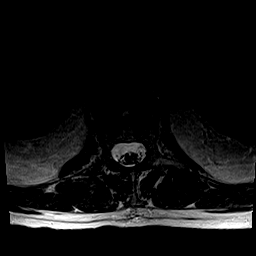
[im 42/42]
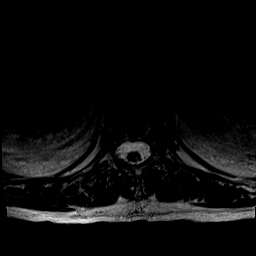

[Series 6: T1 · axial · 4.0mm · 0.35mm/px · z∈[-73,+113]mm · 4 of 42 slices shown (2 of 2)]
[im 1/42]
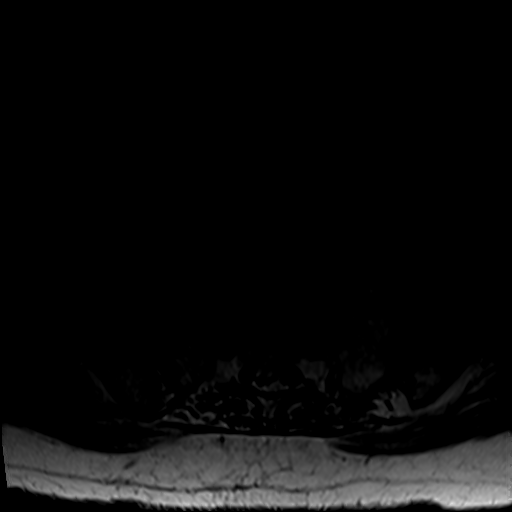
[im 6/42]
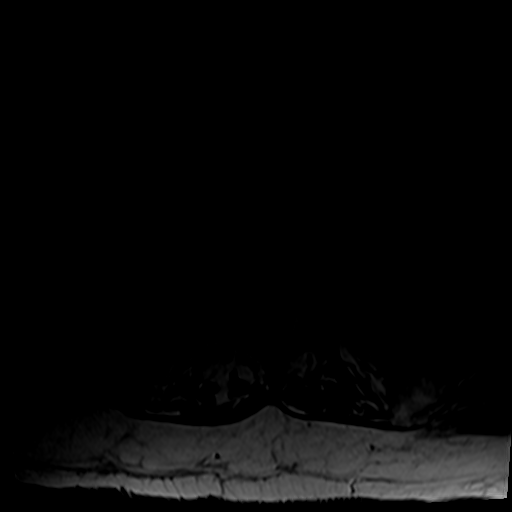
[im 21/42]
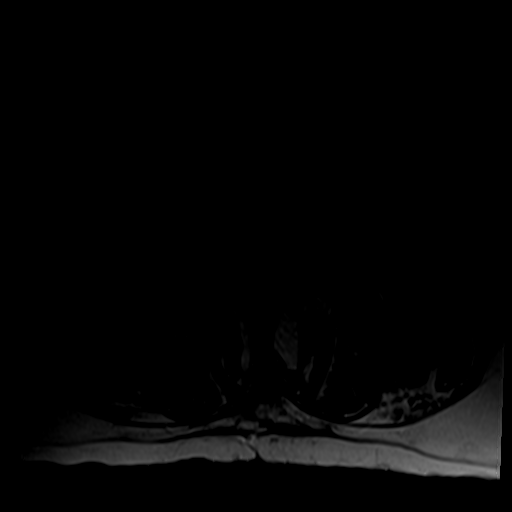
[im 36/42]
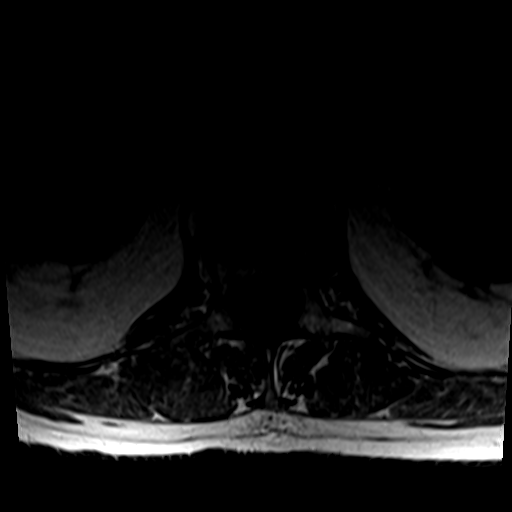

[25 of 48 positions shown; findings below may reference images not displayed]

FINDINGS: Segmentation:  Standard.

Alignment: Slight lumbar scoliosis with convexity to the right
centered at L2-3. 2 mm retrolisthesis of L2 on L3, chronic.

Vertebrae: No acute abnormalities. Old compression fractures of L1
and L2 treated with kyphoplasty.

Conus medullaris and cauda equina: Conus extends to the L2 level.
Conus and cauda equina appear normal.

Paraspinal and other soft tissues: Negative.

Disc levels:

T12-L1: No significant abnormality.

L1-2: Tiny broad-based disc bulge with no neural impingement.

L2-3: Broad-based disc bulge with a small protrusion to the left of
midline extending into the left neural foramen with moderate left
foraminal stenosis. This is unchanged.

L3-4: Chronic marked disc space narrowing. Small broad-based
endplate osteophytes without neural impingement. Previous surgery on
the right. Moderate bilateral foraminal stenosis, unchanged.

L4-5: Small central disc bulge with no neural impingement,
unchanged.

L5-S1: Normal disc. Minimal degenerative changes of the right facet
joint.
IMPRESSION: 1. Multilevel degenerative disc disease without neural impingement.
2. Old compression fractures of L1 and L2 treated with kyphoplasty.

## 2020-12-13 DIAGNOSIS — Z79899 Other long term (current) drug therapy: Secondary | ICD-10-CM | POA: Diagnosis not present

## 2020-12-13 DIAGNOSIS — I10 Essential (primary) hypertension: Secondary | ICD-10-CM | POA: Diagnosis not present

## 2020-12-13 DIAGNOSIS — R5383 Other fatigue: Secondary | ICD-10-CM | POA: Diagnosis not present

## 2020-12-13 DIAGNOSIS — Z0000 Encounter for general adult medical examination without abnormal findings: Secondary | ICD-10-CM | POA: Diagnosis not present

## 2020-12-13 DIAGNOSIS — Z7189 Other specified counseling: Secondary | ICD-10-CM | POA: Diagnosis not present

## 2020-12-13 DIAGNOSIS — Z87891 Personal history of nicotine dependence: Secondary | ICD-10-CM | POA: Diagnosis not present

## 2020-12-13 DIAGNOSIS — Z125 Encounter for screening for malignant neoplasm of prostate: Secondary | ICD-10-CM | POA: Diagnosis not present

## 2020-12-13 DIAGNOSIS — E7800 Pure hypercholesterolemia, unspecified: Secondary | ICD-10-CM | POA: Diagnosis not present

## 2020-12-13 DIAGNOSIS — Z6830 Body mass index (BMI) 30.0-30.9, adult: Secondary | ICD-10-CM | POA: Diagnosis not present

## 2020-12-13 DIAGNOSIS — Z299 Encounter for prophylactic measures, unspecified: Secondary | ICD-10-CM | POA: Diagnosis not present

## 2020-12-13 DIAGNOSIS — Z1331 Encounter for screening for depression: Secondary | ICD-10-CM | POA: Diagnosis not present

## 2020-12-14 IMAGING — DX DG CHEST 1V PORT
1 series · 1 of 1 positions shown · non-contrast
Comparison: Chest radiograph dated 11/05/2018

CLINICAL DATA: Chest pain

EXAM:
PORTABLE CHEST 1 VIEW

[chest ap]
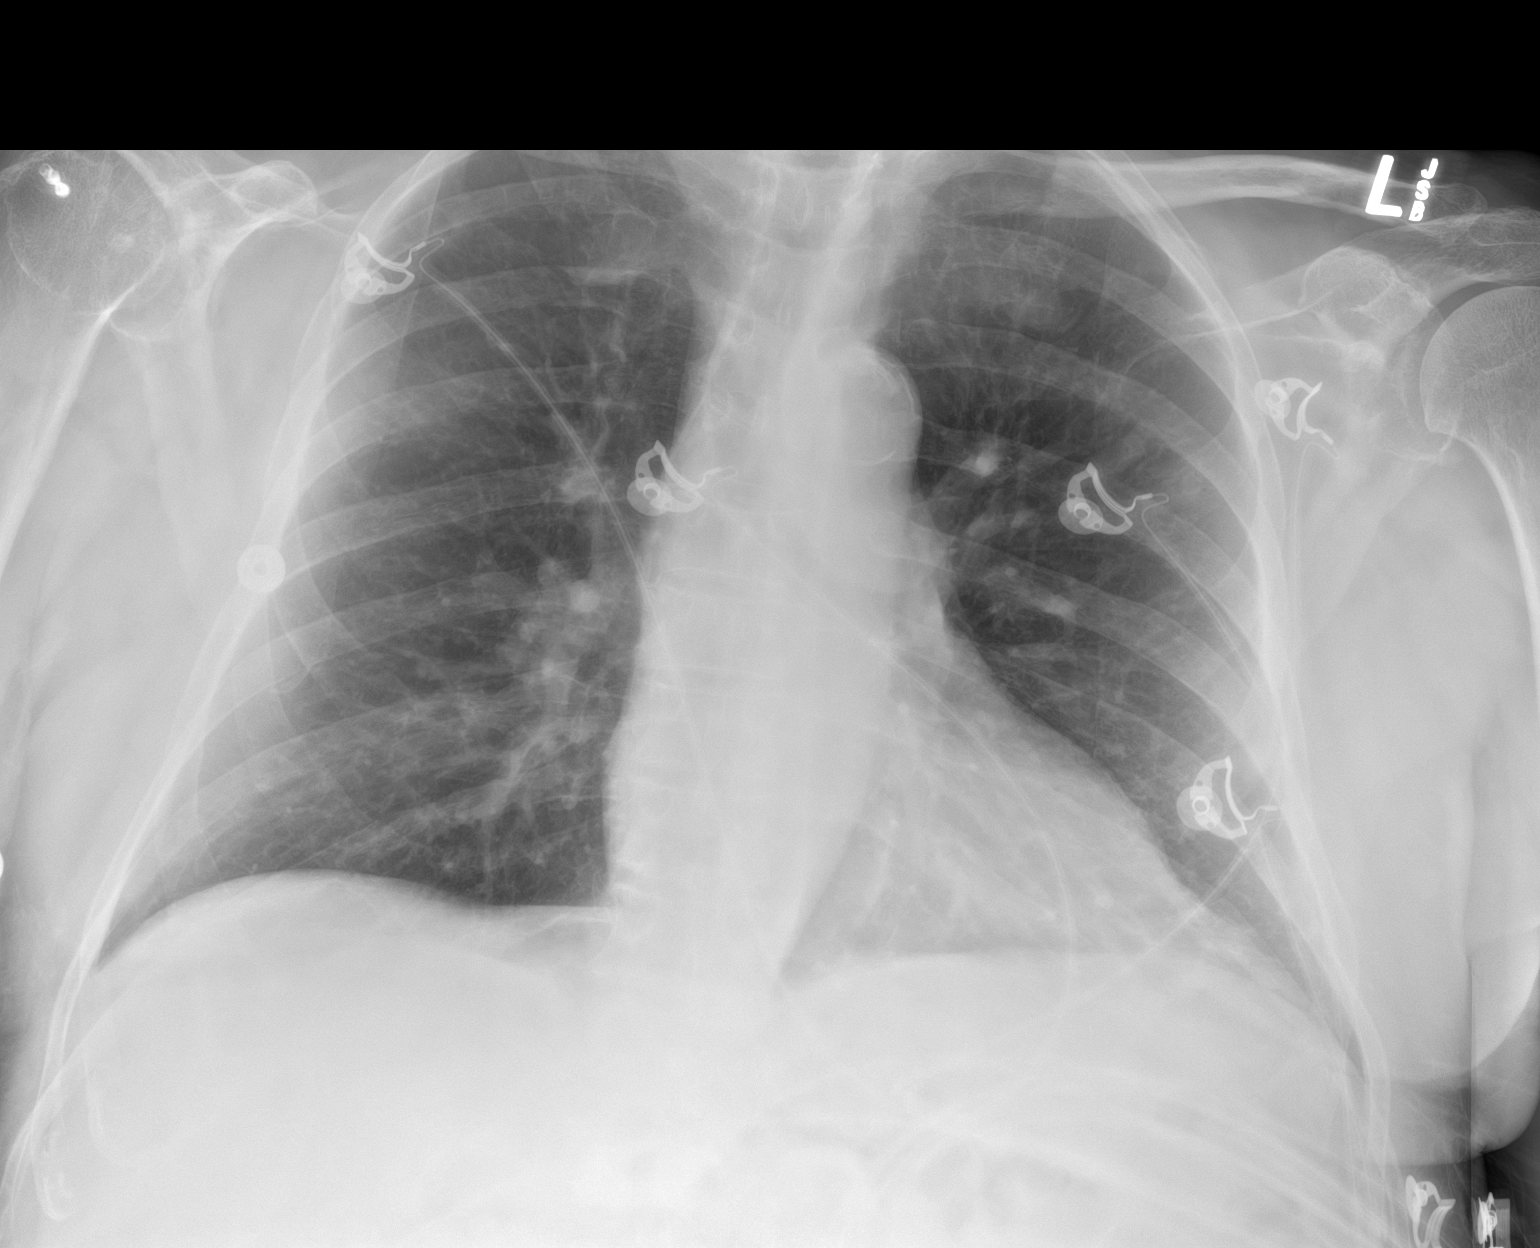

[1 of 1 positions shown; findings below may reference images not displayed]

FINDINGS: The heart size is normal. Vascular calcifications are seen in the
aortic arch. Both lungs are clear. The visualized skeletal
structures are unremarkable.
IMPRESSION: 1. No active cardiopulmonary disease.

Aortic Atherosclerosis ([id number]-F20.0).

## 2020-12-18 DIAGNOSIS — Z515 Encounter for palliative care: Secondary | ICD-10-CM | POA: Diagnosis not present

## 2020-12-18 DIAGNOSIS — M170 Bilateral primary osteoarthritis of knee: Secondary | ICD-10-CM | POA: Diagnosis not present

## 2020-12-18 DIAGNOSIS — Z7901 Long term (current) use of anticoagulants: Secondary | ICD-10-CM | POA: Diagnosis not present

## 2020-12-18 DIAGNOSIS — D6869 Other thrombophilia: Secondary | ICD-10-CM | POA: Diagnosis not present

## 2020-12-18 DIAGNOSIS — D692 Other nonthrombocytopenic purpura: Secondary | ICD-10-CM | POA: Diagnosis not present

## 2020-12-18 DIAGNOSIS — I480 Paroxysmal atrial fibrillation: Secondary | ICD-10-CM | POA: Diagnosis not present

## 2020-12-27 ENCOUNTER — Ambulatory Visit
Admission: RE | Admit: 2020-12-27 | Discharge: 2020-12-27 | Disposition: A | Discharge status: Home / Self Care | Payer: PPO | Source: Ambulatory Visit | Attending: Orthopedic Surgery | Admitting: Orthopedic Surgery

## 2020-12-27 ENCOUNTER — Other Ambulatory Visit: Payer: Self-pay

## 2020-12-27 DIAGNOSIS — M25562 Pain in left knee: Secondary | ICD-10-CM

## 2021-01-04 DIAGNOSIS — M1712 Unilateral primary osteoarthritis, left knee: Secondary | ICD-10-CM | POA: Diagnosis not present

## 2021-01-22 ENCOUNTER — Emergency Department (HOSPITAL_COMMUNITY): Payer: PPO

## 2021-01-22 ENCOUNTER — Emergency Department (HOSPITAL_COMMUNITY)
Admission: EM | Admit: 2021-01-22 | Discharge: 2021-01-23 | Disposition: A | Discharge status: Home / Self Care | Payer: PPO | Attending: Emergency Medicine | Admitting: Emergency Medicine

## 2021-01-22 DIAGNOSIS — I480 Paroxysmal atrial fibrillation: Secondary | ICD-10-CM | POA: Diagnosis not present

## 2021-01-22 DIAGNOSIS — I10 Essential (primary) hypertension: Secondary | ICD-10-CM | POA: Diagnosis not present

## 2021-01-22 DIAGNOSIS — I700 Atherosclerosis of aorta: Secondary | ICD-10-CM | POA: Diagnosis not present

## 2021-01-22 DIAGNOSIS — Z7984 Long term (current) use of oral hypoglycemic drugs: Secondary | ICD-10-CM | POA: Insufficient documentation

## 2021-01-22 DIAGNOSIS — M25461 Effusion, right knee: Secondary | ICD-10-CM | POA: Diagnosis not present

## 2021-01-22 DIAGNOSIS — I2510 Atherosclerotic heart disease of native coronary artery without angina pectoris: Secondary | ICD-10-CM | POA: Diagnosis not present

## 2021-01-22 DIAGNOSIS — R531 Weakness: Secondary | ICD-10-CM | POA: Diagnosis not present

## 2021-01-22 DIAGNOSIS — E039 Hypothyroidism, unspecified: Secondary | ICD-10-CM | POA: Diagnosis not present

## 2021-01-22 DIAGNOSIS — E119 Type 2 diabetes mellitus without complications: Secondary | ICD-10-CM | POA: Insufficient documentation

## 2021-01-22 DIAGNOSIS — Z7901 Long term (current) use of anticoagulants: Secondary | ICD-10-CM | POA: Diagnosis not present

## 2021-01-22 DIAGNOSIS — I4891 Unspecified atrial fibrillation: Secondary | ICD-10-CM | POA: Diagnosis not present

## 2021-01-22 DIAGNOSIS — D6869 Other thrombophilia: Secondary | ICD-10-CM | POA: Diagnosis not present

## 2021-01-22 DIAGNOSIS — M170 Bilateral primary osteoarthritis of knee: Secondary | ICD-10-CM | POA: Diagnosis not present

## 2021-01-22 DIAGNOSIS — R519 Headache, unspecified: Secondary | ICD-10-CM | POA: Diagnosis not present

## 2021-01-22 DIAGNOSIS — D692 Other nonthrombocytopenic purpura: Secondary | ICD-10-CM | POA: Diagnosis not present

## 2021-01-22 DIAGNOSIS — E1142 Type 2 diabetes mellitus with diabetic polyneuropathy: Secondary | ICD-10-CM | POA: Diagnosis not present

## 2021-01-22 DIAGNOSIS — W19XXXA Unspecified fall, initial encounter: Secondary | ICD-10-CM | POA: Insufficient documentation

## 2021-01-22 DIAGNOSIS — Z79899 Other long term (current) drug therapy: Secondary | ICD-10-CM | POA: Insufficient documentation

## 2021-01-22 DIAGNOSIS — M542 Cervicalgia: Secondary | ICD-10-CM | POA: Diagnosis not present

## 2021-01-22 DIAGNOSIS — Z043 Encounter for examination and observation following other accident: Secondary | ICD-10-CM | POA: Diagnosis not present

## 2021-01-22 DIAGNOSIS — M25561 Pain in right knee: Secondary | ICD-10-CM | POA: Diagnosis not present

## 2021-01-22 DIAGNOSIS — E1159 Type 2 diabetes mellitus with other circulatory complications: Secondary | ICD-10-CM | POA: Diagnosis not present

## 2021-01-22 DIAGNOSIS — Z794 Long term (current) use of insulin: Secondary | ICD-10-CM | POA: Diagnosis not present

## 2021-01-22 DIAGNOSIS — Z20822 Contact with and (suspected) exposure to covid-19: Secondary | ICD-10-CM | POA: Insufficient documentation

## 2021-01-22 DIAGNOSIS — I491 Atrial premature depolarization: Secondary | ICD-10-CM | POA: Diagnosis not present

## 2021-01-22 DIAGNOSIS — E1151 Type 2 diabetes mellitus with diabetic peripheral angiopathy without gangrene: Secondary | ICD-10-CM | POA: Diagnosis not present

## 2021-01-22 DIAGNOSIS — F3340 Major depressive disorder, recurrent, in remission, unspecified: Secondary | ICD-10-CM | POA: Diagnosis not present

## 2021-01-22 DIAGNOSIS — Y92009 Unspecified place in unspecified non-institutional (private) residence as the place of occurrence of the external cause: Secondary | ICD-10-CM | POA: Diagnosis not present

## 2021-01-22 LAB — URINALYSIS, ROUTINE W REFLEX MICROSCOPIC
Bilirubin Urine: NEGATIVE
Glucose, UA: NEGATIVE mg/dL
Hgb urine dipstick: NEGATIVE
Ketones, ur: NEGATIVE mg/dL
Leukocytes,Ua: NEGATIVE
Nitrite: NEGATIVE
Protein, ur: NEGATIVE mg/dL
Specific Gravity, Urine: 1.015 (ref 1.005–1.030)
pH: 7.5 (ref 5.0–8.0)

## 2021-01-22 LAB — RESP PANEL BY RT-PCR (FLU A&B, COVID) ARPGX2
Influenza A by PCR: NEGATIVE
Influenza B by PCR: NEGATIVE
SARS Coronavirus 2 by RT PCR: NEGATIVE

## 2021-01-22 LAB — CBC WITH DIFFERENTIAL/PLATELET
Abs Immature Granulocytes: 0.05 10*3/uL (ref 0.00–0.07)
Basophils Absolute: 0.1 10*3/uL (ref 0.0–0.1)
Basophils Relative: 1 %
Eosinophils Absolute: 0.1 10*3/uL (ref 0.0–0.5)
Eosinophils Relative: 1 %
HCT: 39.9 % (ref 39.0–52.0)
Hemoglobin: 12.7 g/dL — ABNORMAL LOW (ref 13.0–17.0)
Immature Granulocytes: 0 %
Lymphocytes Relative: 9 %
Lymphs Abs: 1.1 10*3/uL (ref 0.7–4.0)
MCH: 28.3 pg (ref 26.0–34.0)
MCHC: 31.8 g/dL (ref 30.0–36.0)
MCV: 88.9 fL (ref 80.0–100.0)
Monocytes Absolute: 0.6 10*3/uL (ref 0.1–1.0)
Monocytes Relative: 5 %
Neutro Abs: 9.6 10*3/uL — ABNORMAL HIGH (ref 1.7–7.7)
Neutrophils Relative %: 84 %
Platelets: 309 10*3/uL (ref 150–400)
RBC: 4.49 MIL/uL (ref 4.22–5.81)
RDW: 14.4 % (ref 11.5–15.5)
WBC: 11.5 10*3/uL — ABNORMAL HIGH (ref 4.0–10.5)
nRBC: 0 % (ref 0.0–0.2)

## 2021-01-22 LAB — COMPREHENSIVE METABOLIC PANEL
ALT: 17 U/L (ref 0–44)
AST: 18 U/L (ref 15–41)
Albumin: 3.7 g/dL (ref 3.5–5.0)
Alkaline Phosphatase: 170 U/L — ABNORMAL HIGH (ref 38–126)
Anion gap: 10 (ref 5–15)
BUN: 12 mg/dL (ref 8–23)
CO2: 27 mmol/L (ref 22–32)
Calcium: 9.7 mg/dL (ref 8.9–10.3)
Chloride: 102 mmol/L (ref 98–111)
Creatinine, Ser: 0.72 mg/dL (ref 0.61–1.24)
GFR, Estimated: >60 mL/min (ref >60)
Glucose, Bld: 149 mg/dL — ABNORMAL HIGH (ref 70–99)
Potassium: 3.8 mmol/L (ref 3.5–5.1)
Sodium: 139 mmol/L (ref 135–145)
Total Bilirubin: 0.6 mg/dL (ref 0.3–1.2)
Total Protein: 7 g/dL (ref 6.5–8.1)

## 2021-01-22 LAB — CBG MONITORING, ED: Glucose-Capillary: 137 mg/dL — ABNORMAL HIGH (ref 70–99)

## 2021-01-22 LAB — CK: Total CK: 55 U/L (ref 49–397)

## 2021-01-22 LAB — ETHANOL: Alcohol, Ethyl (B): <10 mg/dL (ref <10)

## 2021-01-22 LAB — MAGNESIUM: Magnesium: 1.9 mg/dL (ref 1.7–2.4)

## 2021-01-22 MED ORDER — LACTATED RINGERS IV BOLUS
1000.0000 mL | Freq: Once | INTRAVENOUS | Status: AC
  Administered 2021-01-22: 1000 mL via INTRAVENOUS

## 2021-01-22 MED ORDER — DILTIAZEM HCL ER COATED BEADS 240 MG PO CP24
240.0000 mg | ORAL_CAPSULE | Freq: Every day | ORAL | Status: DC
  Administered 2021-01-22: 240 mg via ORAL
  Filled 2021-01-22: qty 1

## 2021-01-22 MED ORDER — CARVEDILOL 12.5 MG PO TABS
12.5000 mg | ORAL_TABLET | Freq: Two times a day (BID) | ORAL | Status: DC
  Administered 2021-01-22: 12.5 mg via ORAL
  Filled 2021-01-22: qty 1

## 2021-01-22 MED ORDER — BENAZEPRIL HCL 10 MG PO TABS
20.0000 mg | ORAL_TABLET | Freq: Every day | ORAL | Status: DC
  Administered 2021-01-22: 20 mg via ORAL
  Filled 2021-01-22: qty 2

## 2021-01-22 NOTE — ED Provider Notes (Signed)
[facility] EMERGENCY DEPARTMENT Provider Note   CSN: [id number] Arrival date & time: 01/22/21  1611     History  Chief Complaint  Patient presents with   [name]    Brett Matthews is a 88 y.o. male.   Fall Pertinent negatives include no chest pain, no abdominal pain, no headaches and no shortness of breath. Patient presents after a fall at home.  He was reportedly on the floor for hours unable to get up.  Per chart review, medical history is notable for CAD, T2DM, hypothyroidism, A. fib, HLD, hemorrhoids.  On arrival, patient endorses right knee pain but denies any other areas of discomfort.  He does state that he feels thirsty.  As per the patient's wife he states that he has had ongoing issues with mobility.  He has chronic pain in bilateral knees.  He has undergone steroid injections in these knees which have been decreasing in effectiveness.  Patient is chronically at risk of falls and home has been repositioned to limit risk of injury.  When patient fell this morning, he was attempting to go to the bathroom.  Patient's wife confirms the patient is currently at his mental baseline.     Home Medications Prior to Admission medications   Medication Sig Start Date End Date Taking? Authorizing Provider  acetaminophen (TYLENOL) 500 MG tablet Take 1,000 mg by mouth every 8 (eight) hours as needed for moderate pain.   Yes [provider]  albuterol (VENTOLIN HFA) 108 (90 Base) MCG/ACT inhaler Inhale 1-2 puffs into the lungs every 6 (six) hours as needed for wheezing or shortness of breath.   Yes [provider]  atorvastatin (LIPITOR) 10 MG tablet Take 1 tablet (10 mg total) by mouth daily. 02/02/20  Yes Adrian Prows, MD  benazepril (LOTENSIN) 20 MG tablet Take 1 tablet (20 mg total) by mouth daily. 11/02/20  Yes Cantwell, Celeste C, PA-C  carvedilol (COREG) 12.5 MG tablet Take 12.5 mg by mouth 2 (two) times daily with a meal.   Yes [provider]  Cholecalciferol  (D3-1000) 25 MCG (1000 UT) capsule Take 1,000 Units by mouth every evening.   Yes [provider]  clonazePAM (KLONOPIN) 1 MG tablet Take 1 mg by mouth at bedtime. For sleep   Yes [provider]  diltiazem (CARDIZEM CD) 240 MG 24 hr capsule Take 240 mg by mouth daily.   Yes [provider]  docusate sodium (COLACE) 100 MG capsule Take 100 mg by mouth every evening.   Yes [provider]  famotidine (PEPCID) 40 MG tablet Take 40 mg by mouth daily. 03/04/20  Yes [provider]  fluticasone (FLONASE) 50 MCG/ACT nasal spray Place 1 spray into both nostrils daily as needed for allergies.   Yes [provider]  folic acid (FOLVITE) 1 MG tablet Take 1 tablet by mouth daily at 12 noon. 03/12/17  Yes [provider]  gabapentin (NEURONTIN) 300 MG capsule Take 300 mg by mouth 3 (three) times daily.   Yes [provider]  glimepiride (AMARYL) 2 MG tablet Take 2 mg by mouth 2 (two) times daily. 03/10/17  Yes [provider]  Insulin Degludec (TRESIBA) 100 UNIT/ML SOLN Inject 18 Units into the skin daily.   Yes [provider]  levothyroxine (SYNTHROID) 125 MCG tablet Take 125 mcg by mouth daily at 2 am. SYNTHROID - brand name only   Yes [provider]  Melatonin 10 MG TABS Take 10 mg by mouth at bedtime.  Yes [provider]  metFORMIN (GLUCOPHAGE) 500 MG tablet Take 500-1,000 mg by mouth See admin instructions. Take 2 tabs (1,000mg ) by mouth every morning & 1 tab (500mg ) every evening   Yes [provider]  pantoprazole (PROTONIX) 40 MG tablet Take 40 mg by mouth daily at 12 noon.   Yes [provider]  polyethylene glycol (MIRALAX / GLYCOLAX) 17 g packet Take 17 g by mouth daily.   Yes [provider]  tamsulosin (FLOMAX) 0.4 MG CAPS capsule Take 0.4 mg by mouth daily after breakfast. 09/09/12  Yes [provider]  vitamin B-12 (CYANOCOBALAMIN) 1000 MCG tablet Take 1,000  mcg by mouth daily.   Yes [provider]  XARELTO 15 MG TABS tablet Take 15 mg by mouth every evening. 02/08/20  Yes [provider]  zinc gluconate 50 MG tablet Take 50 mg by mouth daily.   Yes [provider]  amLODipine (NORVASC) 10 MG tablet Take 10 mg by mouth daily at 12 noon. Patient not taking: Reported on 11/02/2020 04/04/19   [provider]  citalopram (CELEXA) 40 MG tablet Take 40 mg by mouth daily. Patient not taking: Reported on 11/02/2020 03/23/17   [provider]      Allergies    Patient has no known allergies.    Review of Systems   Review of Systems  Constitutional:  Negative for activity change, appetite change, chills, fatigue and fever.  HENT:  Negative for ear pain and sore throat.   Eyes:  Negative for pain and visual disturbance.  Respiratory:  Negative for cough, chest tightness and shortness of breath.   Cardiovascular:  Negative for chest pain and palpitations.  Gastrointestinal:  Negative for abdominal pain, diarrhea, nausea and vomiting.  Genitourinary:  Negative for dysuria and hematuria.  Musculoskeletal:  Positive for arthralgias and gait problem. Negative for back pain, myalgias and neck pain.  Skin:  Negative for color change and rash.  Neurological:  Negative for dizziness, seizures, syncope, speech difficulty, weakness, light-headedness, numbness and headaches.  Hematological:  Does not bruise/bleed easily.  All other systems reviewed and are negative.  Physical Exam Updated Vital Signs BP (!) 190/96    Pulse 76    Temp 97.9 F (36.6 C)    Resp (!) 22    SpO2 96%  Physical Exam Vitals and nursing note reviewed.  Constitutional:      General: He is not in acute distress.    Appearance: Normal appearance. He is well-developed. He is not ill-appearing, toxic-appearing or diaphoretic.  HENT:     Head: Normocephalic and atraumatic.     Right Ear: External ear normal.     Left Ear: External ear normal.      Nose: Nose normal.     Mouth/Throat:     Mouth: Mucous membranes are dry.     Pharynx: Oropharynx is clear.  Eyes:     General: No scleral icterus.    Extraocular Movements: Extraocular movements intact.     Conjunctiva/sclera: Conjunctivae normal.  Cardiovascular:     Rate and Rhythm: Normal rate and regular rhythm.     Heart sounds: No murmur heard. Pulmonary:     Effort: Pulmonary effort is normal. No respiratory distress.     Breath sounds: Normal breath sounds. No wheezing or rales.  Chest:     Chest wall: No tenderness.  Abdominal:     General: Abdomen is flat.     Palpations: Abdomen is soft.     Tenderness:  There is no abdominal tenderness.  Musculoskeletal:        General: Tenderness (r knee) present. No swelling or deformity. Normal range of motion.     Cervical back: Normal range of motion and neck supple. No rigidity or tenderness.  Skin:    General: Skin is warm and dry.     Capillary Refill: Capillary refill takes less than 2 seconds.     Coloration: Skin is not jaundiced or pale.  Neurological:     General: No focal deficit present.     Mental Status: He is alert. Mental status is at baseline.     Cranial Nerves: No cranial nerve deficit.     Sensory: No sensory deficit.     Motor: No weakness.     Coordination: Coordination normal.  Psychiatric:        Mood and Affect: Mood normal.        Behavior: Behavior normal.    ED Results / Procedures / Treatments   Labs (all labs ordered are listed, but only abnormal results are displayed) Labs Reviewed  COMPREHENSIVE METABOLIC PANEL - Abnormal; Notable for the following components:      Result Value   Glucose, Bld 149 (*)    Alkaline Phosphatase 170 (*)    All other components within normal limits  CBC WITH DIFFERENTIAL/PLATELET - Abnormal; Notable for the following components:   WBC 11.5 (*)    Hemoglobin 12.7 (*)    Neutro Abs 9.6 (*)    All other components within normal limits  CBG MONITORING, ED -  Abnormal; Notable for the following components:   Glucose-Capillary 137 (*)    All other components within normal limits  RESP PANEL BY RT-PCR (FLU A&B, COVID) ARPGX2  ETHANOL  URINALYSIS, ROUTINE W REFLEX MICROSCOPIC  CK  MAGNESIUM    EKG EKG Interpretation  Date/Time:  Tuesday January 22 2021 18:34:23 EST Ventricular Rate:  87 PR Interval:  184 QRS Duration: 147 QT Interval:  432 QTC Calculation: 520 R Axis:   -86 Text Interpretation: Age not entered, assumed to be  88 years old for purpose of ECG interpretation Sinus rhythm RBBB and LAFB Confirmed by Godfrey Pick [phone]) on 01/22/2021 6:57:15 PM  Radiology No results found.  Procedures Procedures    Medications Ordered in ED Medications  lactated ringers bolus 1,000 mL (0 mLs Intravenous Stopped 01/22/21 2057)    ED Course/ Medical Decision Making/ A&P                           Medical Decision Making Amount and/or Complexity of Data Reviewed Labs: ordered. Radiology: ordered.   This patient presents to the ED for concern of fall, this involves an extensive number of treatment options, and is a complaint that carries with it a high risk of complications and morbidity.  The differential diagnosis includes acute injuries   Co morbidities that complicate the patient evaluation  Arthritis, CAD, DM, HTN, GERD, paroxysmal A. fib, HLD, hypothyroidism   Additional history obtained:  Additional history obtained from patient's wife External records from outside source obtained and reviewed including EMR   Lab Tests:  I Ordered, and personally interpreted labs.  The pertinent results include: Normal findings   Imaging Studies ordered:  I ordered imaging studies including x-ray imaging of chest, pelvis, and right knee; CT imaging of head and cervical spine I independently visualized and interpreted imaging which showed no acute injury I agree with the radiologist interpretation  Cardiac Monitoring:  The  patient was maintained on a cardiac monitor.  I personally viewed and interpreted the cardiac monitored which showed an underlying rhythm of: Sinus rhythm   Medicines ordered and prescription drug management:  I ordered medication including IV fluids for dehydration; home blood pressure medications for hypertension Reevaluation of the patient after these medicines showed that the patient improved I have reviewed the patients home medicines and have made adjustments as needed  Problem List / ED Course:  88 year old male with history of arthritis, causing poor mobility, presents after a fall at home.  Fall occurred when he was try to use the bathroom.  He was reportedly on the floor for several hours.  On arrival in the ED, he endorses pain to the area of his right knee.  On exam, he has dry oral mucosa but is otherwise well-appearing.  He has no appreciable areas of deformity.  Lab work and imaging studies were ordered.  Patient was given IV fluids for treatment of dehydration.  While in the ED, he had persistent elevated blood pressure.  Home blood pressure medications were ordered.  Patient's work-up was reassuring.  He was accompanied by his wife at bedside who was able to give greater history.  Patient does have very poor mobility at baseline.  He was stood up in the ED today and required assist, which his wife states is baseline for him.  Because of his poor mobility, she is concerned that he may have further falls.  Face-to-face order was placed for evaluation of possible home health needs.     Reevaluation:  After the interventions noted above, I reevaluated the patient and found that they have :improved   Social Determinants of Health:  Patient has poor mobility short-term memory loss at baseline.  He may require greater home health care than his significant other can provide at this time.   Dispostion:  After consideration of the diagnostic results and the patients response to  treatment, I feel that the patent would benefit from discharge with social work follow-up.          Final Clinical Impression(s) / ED Diagnoses Final diagnoses:  Fall    Rx / DC Orders ED Discharge Orders     None         Godfrey Pick, MD 01/24/21 1919

## 2021-01-22 NOTE — ED Triage Notes (Signed)
Fell at home today and was in the floor for hours, incontinent of stool on arrival. Patient denies any pain but was unable to get up after fall

## 2021-01-22 NOTE — ED Notes (Signed)
Pt to [facility]  Max assist x2 staff Pt unsteady gait and complain of pain of both knees  Pt able to follow simple command of moving his feet and placement of hands

## 2021-01-22 NOTE — ED Notes (Signed)
Pt is eating dinner tray at this time.

## 2021-01-22 NOTE — ED Notes (Signed)
Removed bedside toilet

## 2021-01-28 DIAGNOSIS — Z043 Encounter for examination and observation following other accident: Secondary | ICD-10-CM | POA: Diagnosis not present

## 2021-01-28 DIAGNOSIS — I700 Atherosclerosis of aorta: Secondary | ICD-10-CM | POA: Diagnosis not present

## 2021-01-28 DIAGNOSIS — I10 Essential (primary) hypertension: Secondary | ICD-10-CM | POA: Diagnosis not present

## 2021-01-28 DIAGNOSIS — E1142 Type 2 diabetes mellitus with diabetic polyneuropathy: Secondary | ICD-10-CM | POA: Diagnosis not present

## 2021-01-28 DIAGNOSIS — E1165 Type 2 diabetes mellitus with hyperglycemia: Secondary | ICD-10-CM | POA: Diagnosis not present

## 2021-01-28 DIAGNOSIS — Z299 Encounter for prophylactic measures, unspecified: Secondary | ICD-10-CM | POA: Diagnosis not present

## 2021-01-28 DIAGNOSIS — F1320 Sedative, hypnotic or anxiolytic dependence, uncomplicated: Secondary | ICD-10-CM | POA: Diagnosis not present

## 2021-01-30 DIAGNOSIS — D692 Other nonthrombocytopenic purpura: Secondary | ICD-10-CM | POA: Diagnosis not present

## 2021-01-30 DIAGNOSIS — Z9181 History of falling: Secondary | ICD-10-CM | POA: Diagnosis not present

## 2021-01-30 DIAGNOSIS — Z7901 Long term (current) use of anticoagulants: Secondary | ICD-10-CM | POA: Diagnosis not present

## 2021-01-30 DIAGNOSIS — M170 Bilateral primary osteoarthritis of knee: Secondary | ICD-10-CM | POA: Diagnosis not present

## 2021-02-01 NOTE — Progress Notes (Signed)
Primary Physician/Referring:  Glenda Chroman, MD  Patient ID: Brett Matthews, male    DOB: [birth year], 88 y.o.   MRN: [id number]   Chief Complaint  Patient presents with   Hypertension   Atrial Fibrillation   Follow-up   HPI:    Brett Matthews  is a 88 y.o.  Caucasian male with paroxysmal atrial fibrillation, hypertension, hyperlipidemia, controlled diabetes mellitus, moderate coronary artery disease in mid circumflex with 60% stenosis by angiography in 2013, last presentation on 01/16/2019 with chest pain and dyspnea SP direct-current cardioversion on 01/18/2019. Flecainide discontinued due to bradycardia. Due to recurrence of symptomatic atrial fibrillation underwent direct-current cardioversion on 03/20/2020 and converted to sinus rhythm with 1 shock.    Patient presents for 40-monthfollow-up.  At last office visit he was maintaining sinus rhythm and relatively asymptomatic, however he was taking both amlodipine and diltiazem, therefore advised him to discontinue diltiazem.  He also at that time had inadvertently discontinued benazepril, therefore resumed this and repeat BMP remained stable.  Upon further review at today's office visit patient had discontinued amlodipine rather than diltiazem following last office visit.   Patient is accompanied by his friend and caretaker who contributes to the history.  Patient is feeling well overall without specific complaints today.  Past Medical History:  Diagnosis Date   Anemia, iron deficiency    Negative capsule endoscopy   Arthritis    Coronary atherosclerosis of native coronary artery    60 % circumflex stenosis; EF 65%, Cath 6/13   Diverticula of colon    Pancolonic   DM (diabetes mellitus), type 2, uncontrolled    Dyspnea    Normal cardiopulmonary function test in July 2008, negative echocardiogram with "bubble" study for inter-cardiac shunt.   Essential hypertension, benign    Gastroesophageal reflux disease    Hemorrhoids     Hyperplastic colon polyp 04/06/2007   Hypothyroidism 2003   Status post total thyroidectomy   Migraines    Neoplasm of lymphatic and hematopoietic tissue    Paroxysmal atrial fibrillation ([place]    Diagnosed 2005   Skin cancer    Past Surgical History:  Procedure Laterality Date   BACK SURGERY     BONE MARROW BIOPSY  1990's   CARDIAC CATHETERIZATION     CARDIOVERSION N/A 01/18/2019   Procedure: CARDIOVERSION;  Surgeon: GAdrian Prows MD;  [facility]  Service: Cardiovascular;  Laterality: N/A;   CARDIOVERSION N/A 03/20/2020   Procedure: CARDIOVERSION;  Surgeon: GAdrian Prows MD;  [facility]  Service: Cardiovascular;  Laterality: N/A;   CATARACT EXTRACTION Bilateral    COLONOSCOPY  04/06/2007   Dr. RGala Romney Normal rectum with scattered pancolonic diverticula and slightly redundant elongated colon, diminutive polpy midsigmoid, remainder of colonic mucosa appeared normal. bx= hyperplastic polyp   COLONOSCOPY N/A 11/29/2012   RDPO:EUMPNTIRpreparation. Friable anal canal/internal hemorrhoids; otherwise, normal rectum. Normal-appearing colonic mucosa   COLONOSCOPY WITH PROPOFOL N/A 03/12/2017   Procedure: COLONOSCOPY WITH PROPOFOL;  Surgeon: RDaneil Dolin MD;  [facility];  Service: Endoscopy;  Laterality: N/A;  7:30am   ESOPHAGOGASTRODUODENOSCOPY  04/06/2007   Dr. RGala Romney normal esophagus s/p nissen fundoplication, intact nissen wrap o/w normal stomach   ESOPHAGOGASTRODUODENOSCOPY N/A 11/29/2012   RMR: Abnormall distal esophagus bx c/w GERD. prior fundoplication. Gastric polyps  -bx benign. Status post biopsy of normal--appearing duodenal mucosa (bx neg)   HEMORRHOID SURGERY N/A 04/06/2017   Procedure: EXTENSIVE HEMORRHOIDECTOMY;  Surgeon: JAviva Signs MD;  Location: [facility];  Service: General;  Laterality: N/A;   LEFT HEART CATHETERIZATION WITH CORONARY ANGIOGRAM N/A 06/27/2011   Procedure: LEFT HEART CATHETERIZATION WITH CORONARY ANGIOGRAM;  Surgeon: Thayer Headings, MD;  Location: [facility];  Service: Cardiovascular;  Laterality: N/A;   NISSEN FUNDOPLICATION     POLYPECTOMY  03/12/2017   Procedure: POLYPECTOMY;  Surgeon: Daneil Dolin, MD;  Location: [facility];  Service: Endoscopy;;  cecal x3   POSTERIOR LAMINECTOMY / DECOMPRESSION LUMBAR SPINE  ~ 1990's   SKIN CANCER EXCISION     "off my back and chest; from sun"   Family History  Problem Relation Age of Onset   Hypertension Mother    Stroke Mother    Cancer Father    Cancer Sister    Stroke Brother    Cancer Other    Stroke Other    Colon cancer Neg Hx    Social History   Tobacco Use   Smoking status: Never   Smokeless tobacco: Never  Substance Use Topics   Alcohol use: No  Marital Status: Divorced  ROS  Review of Systems  Cardiovascular:  Negative for chest pain, claudication, leg swelling, near-syncope, orthopnea, palpitations, paroxysmal nocturnal dyspnea and syncope.  Respiratory:  Negative for shortness of breath.   Objective   Vitals with BMI 02/04/2021 01/22/2021 01/22/2021  Height _0  - -  Weight - - -  BMI - - -  Systolic 254 270 623  Diastolic 82 96 762  Pulse 64 76 80    Physical Exam Vitals reviewed.  Constitutional:      Appearance: Normal appearance.     Comments: He is well-built and well-nourished and appears younger than stated age.  Cardiovascular:     Rate and Rhythm: Normal rate and regular rhythm.     Pulses: Intact distal pulses.     Heart sounds: Normal heart sounds, S1 normal and S2 normal. No murmur heard.   No gallop.  Pulmonary:     Effort: Pulmonary effort is normal. No respiratory distress.     Breath sounds: Normal breath sounds. No wheezing, rhonchi or rales.  Musculoskeletal:     Right lower leg: No edema.     Left lower leg: No edema.  Neurological:     Mental Status: He is alert.   Laboratory examination:   Recent Labs    03/12/20 1259 01/22/21 1725  NA 138 139  K 4.3 3.8  CL 102 102  CO2 22 27  GLUCOSE 157*  149*  BUN 18 12  CREATININE 0.79 0.72  CALCIUM 10.0 9.7  GFRNONAA  --  >60   CrCl cannot be calculated (Unknown ideal weight.).  CMP Latest Ref Rng & Units 01/22/2021 03/12/2020 08/22/2019  Glucose 70 - 99 mg/dL 149(H) 157(H) 214(H)  BUN 8 - 23 mg/dL _1 Creatinine 0.61 - 1.24 mg/dL 0.72 0.79 0.67  Sodium 135 - 145 mmol/L 139 138 134(L)  Potassium 3.5 - 5.1 mmol/L 3.8 4.3 3.7  Chloride 98 - 111 mmol/L 102 102 101  CO2 22 - 32 mmol/L _2 Calcium 8.9 - 10.3 mg/dL 9.7 10.0 8.8(L)  Total Protein 6.5 - 8.1 g/dL 7.0 - 5.4(L)  Total Bilirubin 0.3 - 1.2 mg/dL 0.6 - 0.5  Alkaline Phos 38 - 126 U/L 170(H) - 65  AST 15 - 41 U/L 18 - 14(L)  ALT 0 - 44 U/L 17 - 16   CBC Latest Ref Rng & Units 01/22/2021 03/12/2020 08/22/2019  WBC 4.0 - 10.5 K/uL  11.5(H) 6.1 6.7  Hemoglobin 13.0 - 17.0 g/dL 12.7(L) 10.4(L) 11.1(L)  Hematocrit 39.0 - 52.0 % 39.9 34.7(L) 35.2(L)  Platelets 150 - 400 K/uL 309 317 296   HEMOGLOBIN A1C Lab Results  Component Value Date   HGBA1C 7.6 (H) 08/18/2019   MPG 171.42 08/18/2019   External Labs: 12/13/2020: HDL 34, LDL 62, total cholesterol 124, triglycerides 165 A1c 6.5%  02/15/2019: Glucose 217. Albumin 4.07, Potassium 4.4, Glucose 238 (H), Total Bilirubin 0.3, Protein, Total 6.2, eGFR >6.,  AST 14.4, ALT 16 , Chloride 97 (L), Creatinine 0.79, BUN 14, Alkaline Phosphatase 89.  11/08/2018: Serum glucose 149 mg, BUN 16, creatinine 0.89, eGFR >60 mL.  CBC normal.  11/05/2018: TSH 2.41 normal  12/08/2018: Total cholesterol 173, triglycerides 136, HDL 41, LDL 118. Serum glucose 167 mg, BUN 11, creatinine 0.99, eGFR >70 mL, potassium 3.6.  CMP otherwise normal.  Alkaline phosphatase minimally elevated at 01/07/20.  HB 12.8/HCT 39.9, platelets 297.  TSH normal. PSA mildly elevated.   Allergies  No Known Allergies   Medications Prior to Visit:   Outpatient Medications Prior to Visit  Medication Sig Dispense Refill   acetaminophen (TYLENOL) 500 MG tablet Take  1,000 mg by mouth every 8 (eight) hours as needed for moderate pain.     albuterol (VENTOLIN HFA) 108 (90 Base) MCG/ACT inhaler Inhale 1-2 puffs into the lungs every 6 (six) hours as needed for wheezing or shortness of breath.     atorvastatin (LIPITOR) 10 MG tablet Take 1 tablet (10 mg total) by mouth daily. 90 tablet 2   benazepril (LOTENSIN) 20 MG tablet Take 1 tablet (20 mg total) by mouth daily. 30 tablet 3   carvedilol (COREG) 12.5 MG tablet Take 12.5 mg by mouth 2 (two) times daily with a meal.     Cholecalciferol (D3-1000) 25 MCG (1000 UT) capsule Take 1,000 Units by mouth every evening.     citalopram (CELEXA) 40 MG tablet Take 40 mg by mouth daily.  3   clonazePAM (KLONOPIN) 1 MG tablet Take 1 mg by mouth at bedtime. For sleep     diltiazem (CARDIZEM CD) 240 MG 24 hr capsule Take 240 mg by mouth daily.     docusate sodium (COLACE) 100 MG capsule Take 100 mg by mouth every evening.     famotidine (PEPCID) 40 MG tablet Take 40 mg by mouth daily.     fluticasone (FLONASE) 50 MCG/ACT nasal spray Place 1 spray into both nostrils daily as needed for allergies.     folic acid (FOLVITE) 1 MG tablet Take 1 tablet by mouth daily at 12 noon.  2   gabapentin (NEURONTIN) 300 MG capsule Take 300 mg by mouth 3 (three) times daily.     glimepiride (AMARYL) 2 MG tablet Take 2 mg by mouth 2 (two) times daily.  9   Insulin Degludec (TRESIBA) 100 UNIT/ML SOLN Inject 18 Units into the skin daily.     levothyroxine (SYNTHROID) 125 MCG tablet Take 125 mcg by mouth daily at 2 am. SYNTHROID - brand name only     Melatonin 10 MG TABS Take 10 mg by mouth at bedtime.     metFORMIN (GLUCOPHAGE) 500 MG tablet Take 500-1,000 mg by mouth See admin instructions. Take 2 tabs (1,$RemoveB'000mg'EmUSKsVU$ ) by mouth every morning & 1 tab ($Remo'500mg'emumd$ ) every evening     pantoprazole (PROTONIX) 40 MG tablet Take 40 mg by mouth daily at 12 noon.     polyethylene glycol (MIRALAX / GLYCOLAX) 17 g packet Take  17 g by mouth daily.     tamsulosin (FLOMAX)  0.4 MG CAPS capsule Take 0.4 mg by mouth daily after breakfast.     vitamin B-12 (CYANOCOBALAMIN) 1000 MCG tablet Take 1,000 mcg by mouth daily.     XARELTO 15 MG TABS tablet Take 15 mg by mouth every evening.     zinc gluconate 50 MG tablet Take 50 mg by mouth daily.     amLODipine (NORVASC) 10 MG tablet Take 10 mg by mouth daily at 12 noon.     No facility-administered medications prior to visit.   Final Medications at End of Visit    Current Meds  Medication Sig   acetaminophen (TYLENOL) 500 MG tablet Take 1,000 mg by mouth every 8 (eight) hours as needed for moderate pain.   albuterol (VENTOLIN HFA) 108 (90 Base) MCG/ACT inhaler Inhale 1-2 puffs into the lungs every 6 (six) hours as needed for wheezing or shortness of breath.   atorvastatin (LIPITOR) 10 MG tablet Take 1 tablet (10 mg total) by mouth daily.   benazepril (LOTENSIN) 20 MG tablet Take 1 tablet (20 mg total) by mouth daily.   carvedilol (COREG) 12.5 MG tablet Take 12.5 mg by mouth 2 (two) times daily with a meal.   Cholecalciferol (D3-1000) 25 MCG (1000 UT) capsule Take 1,000 Units by mouth every evening.   citalopram (CELEXA) 40 MG tablet Take 40 mg by mouth daily.   clonazePAM (KLONOPIN) 1 MG tablet Take 1 mg by mouth at bedtime. For sleep   diltiazem (CARDIZEM CD) 240 MG 24 hr capsule Take 240 mg by mouth daily.   docusate sodium (COLACE) 100 MG capsule Take 100 mg by mouth every evening.   famotidine (PEPCID) 40 MG tablet Take 40 mg by mouth daily.   fluticasone (FLONASE) 50 MCG/ACT nasal spray Place 1 spray into both nostrils daily as needed for allergies.   folic acid (FOLVITE) 1 MG tablet Take 1 tablet by mouth daily at 12 noon.   gabapentin (NEURONTIN) 300 MG capsule Take 300 mg by mouth 3 (three) times daily.   glimepiride (AMARYL) 2 MG tablet Take 2 mg by mouth 2 (two) times daily.   Insulin Degludec (TRESIBA) 100 UNIT/ML SOLN Inject 18 Units into the skin daily.   levothyroxine (SYNTHROID) 125 MCG tablet Take  125 mcg by mouth daily at 2 am. SYNTHROID - brand name only   Melatonin 10 MG TABS Take 10 mg by mouth at bedtime.   metFORMIN (GLUCOPHAGE) 500 MG tablet Take 500-1,000 mg by mouth See admin instructions. Take 2 tabs (1,$RemoveB'000mg'gbeJocjN$ ) by mouth every morning & 1 tab ($Remo'500mg'tvPQA$ ) every evening   pantoprazole (PROTONIX) 40 MG tablet Take 40 mg by mouth daily at 12 noon.   polyethylene glycol (MIRALAX / GLYCOLAX) 17 g packet Take 17 g by mouth daily.   tamsulosin (FLOMAX) 0.4 MG CAPS capsule Take 0.4 mg by mouth daily after breakfast.   vitamin B-12 (CYANOCOBALAMIN) 1000 MCG tablet Take 1,000 mcg by mouth daily.   XARELTO 15 MG TABS tablet Take 15 mg by mouth every evening.   zinc gluconate 50 MG tablet Take 50 mg by mouth daily.   [DISCONTINUED] amLODipine (NORVASC) 10 MG tablet Take 10 mg by mouth daily at 12 noon.    Radiology:   Chest x-ray PA and lateral view 11/08/2018: Normal chest x-ray with no active lung disease.  Cardiac Studies:   Coronary  angiogram 06/27/2011: Mild left main disease, mid circumflex 60% stenosis.  Mild luminal irregularity of  the RCA and normal LAD.  LVEF 65%.  Lexiscan Tetrofosmin Stress Test 12/27/2018: Nondiagnostic ECG stress. Normal myocardial perfusion. Stress LV EF: 58%.  No previous exam available for comparison. Low risk study.   PCV ECHOCARDIOGRAM COMPLETE 02/02/2020 Rhythm: Atrial Flutter. Normal LV systolic function with visual EF 50-55%. Left ventricle cavity is normal in size. Normal global wall motion. Unable to evaluate diastolic function due to atrial flutter. Elevated LAP. Calculated EF 62%. Left atrial cavity is mildly dilated. Right atrial cavity is mildly dilated. Mild (Grade I) mitral regurgitation. Mild tricuspid regurgitation. Compared to prior study dated 12/09/2018: Biatrial enlargement is new, LVEF 65% and now 50-55%, otherwise no significant change.  EKG  02/04/2021: Sinus rhythm at a rate of 65 bpm.  Left axis, left anterior fascicular block.   Right bundle branch block.  No evidence of ischemia or underlying injury pattern.  Baseline artifact.  EKG 11/02/2020: Normal sinus rhythm at rate of 63 bpm, left axis deviation, left anterior fascicular block.  Right bundle branch block.  T wave abnormality, cannot exclude inferior ischemia. Normal QT interval.  No significant change from 04/30/2020.  EKG 02/01/2020: Atypical atrial flutter with variable AV conduction at rate of 80 bpm, left axis deviation, left anterior fascicular block.  Right bundle branch block.  Low-voltage complexes.  Pulmonary disease pattern.      Assessment     ICD-10-CM   1. Paroxysmal atrial fibrillation (HCC)  I48.0 EKG 12-Lead    ECHOCARDIOGRAM COMPLETE    2. Essential hypertension, benign  I10     3. Coronary artery disease involving native coronary artery of native heart without angina pectoris  I25.10     4. Mixed hyperlipidemia  E78.2     5. Mild mitral regurgitation  I34.0 ECHOCARDIOGRAM COMPLETE      CHA2DS2-VASc Score is 4.  Yearly risk of stroke: 4.8% (A, HTN, CAD).  Score of 1=0.6; 2=2.2; 3=3.2; 4=4.8; 5=7.2; 6=9.8; 7=>9.8) -(CHF; HTN; vasc disease DM,  Male = 1; Age <65 =0; 65-74 = 1,  >75 =2; stroke/embolism= 2).   No orders of the defined types were placed in this encounter.   Medications Discontinued During This Encounter  Medication Reason   amLODipine (NORVASC) 10 MG tablet Patient has not taken in last 30 days    Recommendations:   Brett Matthews  is a 88 y.o. Caucasian male with paroxysmal atrial fibrillation, hypertension, hyperlipidemia, controlled diabetes mellitus, moderate coronary artery disease in mid circumflex with 60% stenosis by angiography in 2013, last presentation on 01/16/2019 with chest pain and dyspnea SP direct-current cardioversion on 01/18/2019 and flecainide was discontinued due to bradycardia. Due to recurrence of symptomatic atrial fibrillation underwent direct-current cardioversion on 03/20/2020 and converted to  sinus rhythm with 1 shock.    Patient presents for 34-monthfollow-up.  At last office visit he was maintaining sinus rhythm and relatively asymptomatic, however he was taking both amlodipine and diltiazem, therefore advised him to discontinue diltiazem.  He also at that time had inadvertently discontinued benazepril, therefore resumed this and repeat BMP remained stable.  Upon further review patient had continue to take diltiazem rather than amlodipine.  He is currently tolerating medications without issue.  Blood pressure is well controlled.  I personally reviewed external labs, lipids are well controlled.  Patient remains relatively asymptomatic and is maintaining normal sinus rhythm.  Physical exam is unchanged compared to previous office visit.  Now the patient has been maintaining sinus rhythm we will repeat echocardiogram to reevaluate LVEF.  Follow-up in  6 months, sooner if needed.   Alethia Berthold, PA-C 02/05/2021, 10:19 AM Office: [phone]

## 2021-02-04 ENCOUNTER — Other Ambulatory Visit: Payer: Self-pay

## 2021-02-04 ENCOUNTER — Encounter: Payer: Self-pay | Admitting: Student

## 2021-02-04 ENCOUNTER — Ambulatory Visit: Payer: PPO | Admitting: Student

## 2021-02-04 VITALS — BP 140/82 | HR 64 | Ht 71.0 in

## 2021-02-04 DIAGNOSIS — E782 Mixed hyperlipidemia: Secondary | ICD-10-CM | POA: Diagnosis not present

## 2021-02-04 DIAGNOSIS — I340 Nonrheumatic mitral (valve) insufficiency: Secondary | ICD-10-CM | POA: Diagnosis not present

## 2021-02-04 DIAGNOSIS — I480 Paroxysmal atrial fibrillation: Secondary | ICD-10-CM

## 2021-02-04 DIAGNOSIS — I2510 Atherosclerotic heart disease of native coronary artery without angina pectoris: Secondary | ICD-10-CM | POA: Diagnosis not present

## 2021-02-04 DIAGNOSIS — I10 Essential (primary) hypertension: Secondary | ICD-10-CM

## 2021-02-05 DIAGNOSIS — E782 Mixed hyperlipidemia: Secondary | ICD-10-CM | POA: Diagnosis not present

## 2021-02-05 DIAGNOSIS — I10 Essential (primary) hypertension: Secondary | ICD-10-CM | POA: Diagnosis not present

## 2021-02-06 ENCOUNTER — Ambulatory Visit (HOSPITAL_COMMUNITY): Admission: RE | Admit: 2021-02-06 | Payer: PPO | Source: Ambulatory Visit

## 2021-02-07 DIAGNOSIS — M170 Bilateral primary osteoarthritis of knee: Secondary | ICD-10-CM | POA: Diagnosis not present

## 2021-02-07 DIAGNOSIS — Z7901 Long term (current) use of anticoagulants: Secondary | ICD-10-CM | POA: Diagnosis not present

## 2021-02-07 DIAGNOSIS — I10 Essential (primary) hypertension: Secondary | ICD-10-CM | POA: Diagnosis not present

## 2021-02-07 DIAGNOSIS — E1159 Type 2 diabetes mellitus with other circulatory complications: Secondary | ICD-10-CM | POA: Diagnosis not present

## 2021-02-07 DIAGNOSIS — R531 Weakness: Secondary | ICD-10-CM | POA: Diagnosis not present

## 2021-02-07 DIAGNOSIS — D6869 Other thrombophilia: Secondary | ICD-10-CM | POA: Diagnosis not present

## 2021-02-07 DIAGNOSIS — E1151 Type 2 diabetes mellitus with diabetic peripheral angiopathy without gangrene: Secondary | ICD-10-CM | POA: Diagnosis not present

## 2021-02-07 DIAGNOSIS — F3342 Major depressive disorder, recurrent, in full remission: Secondary | ICD-10-CM | POA: Diagnosis not present

## 2021-02-07 DIAGNOSIS — Z515 Encounter for palliative care: Secondary | ICD-10-CM | POA: Diagnosis not present

## 2021-02-07 DIAGNOSIS — D692 Other nonthrombocytopenic purpura: Secondary | ICD-10-CM | POA: Diagnosis not present

## 2021-02-07 DIAGNOSIS — I480 Paroxysmal atrial fibrillation: Secondary | ICD-10-CM | POA: Diagnosis not present

## 2021-02-07 DIAGNOSIS — E1142 Type 2 diabetes mellitus with diabetic polyneuropathy: Secondary | ICD-10-CM | POA: Diagnosis not present

## 2021-02-12 DIAGNOSIS — Z9181 History of falling: Secondary | ICD-10-CM | POA: Diagnosis not present

## 2021-02-12 DIAGNOSIS — M170 Bilateral primary osteoarthritis of knee: Secondary | ICD-10-CM | POA: Diagnosis not present

## 2021-02-12 DIAGNOSIS — Z9989 Dependence on other enabling machines and devices: Secondary | ICD-10-CM | POA: Diagnosis not present

## 2021-02-14 DIAGNOSIS — L84 Corns and callosities: Secondary | ICD-10-CM | POA: Diagnosis not present

## 2021-02-14 DIAGNOSIS — E1142 Type 2 diabetes mellitus with diabetic polyneuropathy: Secondary | ICD-10-CM | POA: Diagnosis not present

## 2021-02-14 DIAGNOSIS — B351 Tinea unguium: Secondary | ICD-10-CM | POA: Diagnosis not present

## 2021-02-14 DIAGNOSIS — M79676 Pain in unspecified toe(s): Secondary | ICD-10-CM | POA: Diagnosis not present

## 2021-03-13 ENCOUNTER — Emergency Department (HOSPITAL_COMMUNITY): Payer: PPO

## 2021-03-13 ENCOUNTER — Other Ambulatory Visit: Payer: Self-pay

## 2021-03-13 ENCOUNTER — Emergency Department (HOSPITAL_COMMUNITY)
Admission: EM | Admit: 2021-03-13 | Discharge: 2021-03-14 | Disposition: A | Discharge status: Home / Self Care | Payer: PPO | Attending: Emergency Medicine | Admitting: Emergency Medicine

## 2021-03-13 ENCOUNTER — Encounter (HOSPITAL_COMMUNITY): Payer: Self-pay

## 2021-03-13 DIAGNOSIS — I10 Essential (primary) hypertension: Secondary | ICD-10-CM | POA: Diagnosis not present

## 2021-03-13 DIAGNOSIS — I4891 Unspecified atrial fibrillation: Secondary | ICD-10-CM | POA: Insufficient documentation

## 2021-03-13 DIAGNOSIS — D692 Other nonthrombocytopenic purpura: Secondary | ICD-10-CM | POA: Diagnosis not present

## 2021-03-13 DIAGNOSIS — I6381 Other cerebral infarction due to occlusion or stenosis of small artery: Secondary | ICD-10-CM | POA: Diagnosis not present

## 2021-03-13 DIAGNOSIS — S0001XA Abrasion of scalp, initial encounter: Secondary | ICD-10-CM | POA: Insufficient documentation

## 2021-03-13 DIAGNOSIS — W19XXXA Unspecified fall, initial encounter: Secondary | ICD-10-CM | POA: Diagnosis not present

## 2021-03-13 DIAGNOSIS — Z7901 Long term (current) use of anticoagulants: Secondary | ICD-10-CM | POA: Insufficient documentation

## 2021-03-13 DIAGNOSIS — S8991XA Unspecified injury of right lower leg, initial encounter: Secondary | ICD-10-CM | POA: Diagnosis present

## 2021-03-13 DIAGNOSIS — R9431 Abnormal electrocardiogram [ECG] [EKG]: Secondary | ICD-10-CM | POA: Diagnosis not present

## 2021-03-13 DIAGNOSIS — Z79899 Other long term (current) drug therapy: Secondary | ICD-10-CM | POA: Insufficient documentation

## 2021-03-13 DIAGNOSIS — M19021 Primary osteoarthritis, right elbow: Secondary | ICD-10-CM | POA: Diagnosis not present

## 2021-03-13 DIAGNOSIS — Z794 Long term (current) use of insulin: Secondary | ICD-10-CM | POA: Insufficient documentation

## 2021-03-13 DIAGNOSIS — G319 Degenerative disease of nervous system, unspecified: Secondary | ICD-10-CM | POA: Diagnosis not present

## 2021-03-13 DIAGNOSIS — E1142 Type 2 diabetes mellitus with diabetic polyneuropathy: Secondary | ICD-10-CM | POA: Diagnosis not present

## 2021-03-13 DIAGNOSIS — F3342 Major depressive disorder, recurrent, in full remission: Secondary | ICD-10-CM | POA: Diagnosis not present

## 2021-03-13 DIAGNOSIS — R531 Weakness: Secondary | ICD-10-CM | POA: Diagnosis not present

## 2021-03-13 DIAGNOSIS — M7989 Other specified soft tissue disorders: Secondary | ICD-10-CM | POA: Diagnosis not present

## 2021-03-13 DIAGNOSIS — M25571 Pain in right ankle and joints of right foot: Secondary | ICD-10-CM | POA: Insufficient documentation

## 2021-03-13 DIAGNOSIS — S82841A Displaced bimalleolar fracture of right lower leg, initial encounter for closed fracture: Secondary | ICD-10-CM | POA: Insufficient documentation

## 2021-03-13 DIAGNOSIS — M170 Bilateral primary osteoarthritis of knee: Secondary | ICD-10-CM | POA: Diagnosis not present

## 2021-03-13 DIAGNOSIS — Z515 Encounter for palliative care: Secondary | ICD-10-CM | POA: Diagnosis not present

## 2021-03-13 DIAGNOSIS — S0990XA Unspecified injury of head, initial encounter: Secondary | ICD-10-CM | POA: Diagnosis present

## 2021-03-13 DIAGNOSIS — E1151 Type 2 diabetes mellitus with diabetic peripheral angiopathy without gangrene: Secondary | ICD-10-CM | POA: Diagnosis not present

## 2021-03-13 MED ORDER — ONDANSETRON HCL 4 MG/2ML IJ SOLN
4.0000 mg | Freq: Once | INTRAMUSCULAR | Status: AC
  Administered 2021-03-14: 4 mg via INTRAVENOUS
  Filled 2021-03-13: qty 2

## 2021-03-13 MED ORDER — HYDROMORPHONE HCL 1 MG/ML IJ SOLN
0.5000 mg | Freq: Once | INTRAMUSCULAR | Status: AC
  Administered 2021-03-14: 0.5 mg via INTRAVENOUS
  Filled 2021-03-13: qty 1

## 2021-03-13 NOTE — ED Notes (Signed)
Edp notified pt would like pain med ?

## 2021-03-13 NOTE — ED Triage Notes (Addendum)
Pt brought in pov after fall at [employer].  Reports knee "give out." Reports pain to R ankle.  Reports hit head.  Pt is on blood thinner.  Abrasion noted to back of head.  No loc.  Gcs 15.  Resp even and unlabored.  Skin warm and dry.  ?

## 2021-03-14 LAB — BASIC METABOLIC PANEL
Anion gap: 9 (ref 5–15)
BUN: 13 mg/dL (ref 8–23)
CO2: 27 mmol/L (ref 22–32)
Calcium: 9.6 mg/dL (ref 8.9–10.3)
Chloride: 103 mmol/L (ref 98–111)
Creatinine, Ser: 0.87 mg/dL (ref 0.61–1.24)
GFR, Estimated: >60 mL/min (ref >60)
Glucose, Bld: 179 mg/dL — ABNORMAL HIGH (ref 70–99)
Potassium: 3.7 mmol/L (ref 3.5–5.1)
Sodium: 139 mmol/L (ref 135–145)

## 2021-03-14 LAB — CBC
HCT: 35.9 % — ABNORMAL LOW (ref 39.0–52.0)
Hemoglobin: 11 g/dL — ABNORMAL LOW (ref 13.0–17.0)
MCH: 26.4 pg (ref 26.0–34.0)
MCHC: 30.6 g/dL (ref 30.0–36.0)
MCV: 86.3 fL (ref 80.0–100.0)
Platelets: 273 10*3/uL (ref 150–400)
RBC: 4.16 MIL/uL — ABNORMAL LOW (ref 4.22–5.81)
RDW: 14.1 % (ref 11.5–15.5)
WBC: 10.2 10*3/uL (ref 4.0–10.5)
nRBC: 0 % (ref 0.0–0.2)

## 2021-03-14 MED ORDER — OXYCODONE-ACETAMINOPHEN 5-325 MG PO TABS: 1.0000 | ORAL_TABLET | Freq: Four times a day (QID) | ORAL | 0 refills | Status: DC | PRN

## 2021-03-14 MED ORDER — OXYCODONE-ACETAMINOPHEN 5-325 MG PO TABS
1.0000 | ORAL_TABLET | ORAL | 0 refills | Status: DC | PRN
Start: 2021-03-14 — End: 2021-03-23

## 2021-03-14 NOTE — ED Provider Notes (Signed)
[facility] EMERGENCY DEPARTMENT Provider Note   CSN: [id number] Arrival date & time: 03/13/21  2130     History  Chief Complaint  Patient presents with   [name]    Brett Matthews is a 88 y.o. male.  Patient presents to the emergency department for evaluation of right elbow and right ankle injury after a fall.  Patient reports that he has bad knees which caused the fall.  He was attempting to get into the backseat of a car when his knee gave out and he fell.  He did hit the back of his head but did not lose consciousness. He does take Xarelto because of history of A-fib.      Home Medications Prior to Admission medications   Medication Sig Start Date End Date Taking? Authorizing Provider  oxyCODONE-acetaminophen (PERCOCET) 5-325 MG tablet Take 1-2 tablets by mouth every 4 (four) hours as needed. 03/14/21  Yes Tucker Steedley, Gwenyth Allegra, MD  oxyCODONE-acetaminophen (PERCOCET/ROXICET) 5-325 MG tablet Take 1 tablet by mouth every 6 (six) hours as needed for severe pain. 03/14/21  Yes Latiya Navia, Gwenyth Allegra, MD  acetaminophen (TYLENOL) 500 MG tablet Take 1,000 mg by mouth every 8 (eight) hours as needed for moderate pain.    [provider]  albuterol (VENTOLIN HFA) 108 (90 Base) MCG/ACT inhaler Inhale 1-2 puffs into the lungs every 6 (six) hours as needed for wheezing or shortness of breath.    [provider]  atorvastatin (LIPITOR) 10 MG tablet Take 1 tablet (10 mg total) by mouth daily. 02/02/20   Adrian Prows, MD  benazepril (LOTENSIN) 20 MG tablet Take 1 tablet (20 mg total) by mouth daily. 11/02/20   Cantwell, Celeste C, PA-C  carvedilol (COREG) 12.5 MG tablet Take 12.5 mg by mouth 2 (two) times daily with a meal.    [provider]  Cholecalciferol (D3-1000) 25 MCG (1000 UT) capsule Take 1,000 Units by mouth every evening.    [provider]  citalopram (CELEXA) 40 MG tablet Take 40 mg by mouth daily. 03/23/17   [provider]  clonazePAM  (KLONOPIN) 1 MG tablet Take 1 mg by mouth at bedtime. For sleep    [provider]  diltiazem (CARDIZEM CD) 240 MG 24 hr capsule Take 240 mg by mouth daily.    [provider]  docusate sodium (COLACE) 100 MG capsule Take 100 mg by mouth every evening.    [provider]  famotidine (PEPCID) 40 MG tablet Take 40 mg by mouth daily. 03/04/20   [provider]  fluticasone (FLONASE) 50 MCG/ACT nasal spray Place 1 spray into both nostrils daily as needed for allergies.    [provider]  folic acid (FOLVITE) 1 MG tablet Take 1 tablet by mouth daily at 12 noon. 03/12/17   [provider]  gabapentin (NEURONTIN) 300 MG capsule Take 300 mg by mouth 3 (three) times daily.    [provider]  glimepiride (AMARYL) 2 MG tablet Take 2 mg by mouth 2 (two) times daily. 03/10/17   [provider]  Insulin Degludec (TRESIBA) 100 UNIT/ML SOLN Inject 18 Units into the skin daily.    [provider]  levothyroxine (SYNTHROID) 125 MCG tablet Take 125 mcg by mouth daily at 2 am. SYNTHROID - brand name only    [provider]  Melatonin 10 MG TABS Take 10 mg by mouth at bedtime.    [provider]  metFORMIN (GLUCOPHAGE) 500 MG tablet Take 500-1,000 mg by mouth See  admin instructions. Take 2 tabs (1,'000mg'$ ) by mouth every morning & 1 tab ('500mg'$ ) every evening    [provider]  pantoprazole (PROTONIX) 40 MG tablet Take 40 mg by mouth daily at 12 noon.    [provider]  polyethylene glycol (MIRALAX / GLYCOLAX) 17 g packet Take 17 g by mouth daily.    [provider]  tamsulosin (FLOMAX) 0.4 MG CAPS capsule Take 0.4 mg by mouth daily after breakfast. 09/09/12   [provider]  vitamin B-12 (CYANOCOBALAMIN) 1000 MCG tablet Take 1,000 mcg by mouth daily.    [provider]  XARELTO 15 MG TABS tablet Take 15 mg by mouth every evening. 02/08/20   [provider]  zinc gluconate 50  MG tablet Take 50 mg by mouth daily.    [provider]      Allergies    Patient has no known allergies.    Review of Systems   Review of Systems  Musculoskeletal:  Positive for arthralgias.   Physical Exam Updated Vital Signs BP (!) 168/109    Pulse 83    Temp 98.3 F (36.8 C) (Oral)    Resp 18    Ht '5\' 11"'$  (1.803 m)    Wt 91.1 kg    SpO2 93%    BMI 28.01 kg/m  Physical Exam Vitals and nursing note reviewed.  Constitutional:      General: He is not in acute distress.    Appearance: He is well-developed.  HENT:     Head: Normocephalic. Abrasion present.      Mouth/Throat:     Mouth: Mucous membranes are moist.  Eyes:     General: Vision grossly intact. Gaze aligned appropriately.     Extraocular Movements: Extraocular movements intact.     Conjunctiva/sclera: Conjunctivae normal.  Cardiovascular:     Rate and Rhythm: Normal rate and regular rhythm.     Pulses: Normal pulses.     Heart sounds: Normal heart sounds, S1 normal and S2 normal. No murmur heard.   No friction rub. No gallop.  Pulmonary:     Effort: Pulmonary effort is normal. No respiratory distress.     Breath sounds: Normal breath sounds.  Abdominal:     Palpations: Abdomen is soft.     Tenderness: There is no abdominal tenderness. There is no guarding or rebound.     Hernia: No hernia is present.  Musculoskeletal:        General: No swelling.     Right elbow: No deformity or lacerations. Normal range of motion. Tenderness present.     Cervical back: Full passive range of motion without pain, normal range of motion and neck supple. No pain with movement, spinous process tenderness or muscular tenderness. Normal range of motion.     Right lower leg: No edema.     Left lower leg: No edema.     Right ankle: Swelling and ecchymosis present. Tenderness present. Decreased range of motion.  Skin:    General: Skin is warm and dry.     Capillary Refill: Capillary refill takes less than 2 seconds.      Findings: No ecchymosis, erythema, lesion or wound.  Neurological:     Mental Status: He is alert and oriented to person, place, and time.     GCS: GCS eye subscore is 4. GCS verbal subscore is 5. GCS motor subscore is 6.     Cranial Nerves: Cranial nerves 2-12 are intact.     Sensory: Sensation  is intact.     Motor: Motor function is intact. No weakness or abnormal muscle tone.     Coordination: Coordination is intact.  Psychiatric:        Mood and Affect: Mood normal.        Speech: Speech normal.        Behavior: Behavior normal.    ED Results / Procedures / Treatments   Labs (all labs ordered are listed, but only abnormal results are displayed) Labs Reviewed  CBC - Abnormal; Notable for the following components:      Result Value   RBC 4.16 (*)    Hemoglobin 11.0 (*)    HCT 35.9 (*)    All other components within normal limits  BASIC METABOLIC PANEL - Abnormal; Notable for the following components:   Glucose, Bld 179 (*)    All other components within normal limits    EKG None  Radiology DG Elbow Complete Right  Result Date: 03/13/2021 CLINICAL DATA:  Recent fall with elbow pain, initial encounter EXAM: RIGHT ELBOW - COMPLETE 3+ VIEW COMPARISON:  None. FINDINGS: Degenerative changes are noted about the elbow joint. No joint effusion is seen. No acute fracture or dislocation is noted. No soft tissue changes are noted. IMPRESSION: Degenerative change without acute abnormality Electronically Signed   By: Inez Catalina M.D.   On: 03/13/2021 23:47   DG Ankle Complete Right  Result Date: 03/13/2021 CLINICAL DATA:  Fall. EXAM: RIGHT ANKLE - COMPLETE 3+ VIEW COMPARISON:  None. FINDINGS: There is an acute oblique fracture through the distal fibular diaphysis at and above the level of the ankle mortise. Fracture fragments are distracted 4 mm. There is an acute transverse fracture through the medial malleolus. The fracture fragments are distracted 5 mm with mild medial displacement of  the proximal fracture fragment. There is abnormal widening of the talotibial joint space with anteromedial displacement of the distal tibia in relation to the talar dome. There is soft tissue swelling surrounding the ankle. Orthopedic screws are seen in the distal tibia. IMPRESSION: 1. Bimalleolar ankle fracture. 2. Anteromedial subluxation of the distal talus in relation to the talar dome. Electronically Signed   By: Ronney Asters M.D.   On: 03/13/2021 22:41   CT Head Wo Contrast  Result Date: 03/13/2021 CLINICAL DATA:  Status post fall. EXAM: CT HEAD WITHOUT CONTRAST TECHNIQUE: Contiguous axial images were obtained from the base of the skull through the vertex without intravenous contrast. RADIATION DOSE REDUCTION: This exam was performed according to the departmental dose-optimization program which includes automated exposure control, adjustment of the mA and/or kV according to patient size and/or use of iterative reconstruction technique. COMPARISON:  January 22, 2021 FINDINGS: Brain: There is mild to moderate severity cerebral atrophy with widening of the extra-axial spaces and ventricular dilatation. There are areas of decreased attenuation within the white matter tracts of the supratentorial brain, consistent with microvascular disease changes. A chronic left lentiform nucleus lacunar infarct is seen. Vascular: No hyperdense vessel or unexpected calcification. Skull: Normal. Negative for fracture or focal lesion. Sinuses/Orbits: No acute finding. Other: None. IMPRESSION: 1. No acute intracranial abnormality. 2. Generalized cerebral atrophy and microvascular disease changes of the supratentorial brain. Electronically Signed   By: Virgina Norfolk M.D.   On: 03/13/2021 22:38    Procedures Procedures    Medications Ordered in ED Medications  HYDROmorphone (DILAUDID) injection 0.5 mg (0.5 mg Intravenous Given 03/14/21 0000)  ondansetron (ZOFRAN) injection 4 mg (4 mg Intravenous Given 03/14/21 0000)  ED Course/ Medical Decision Making/ A&P                           Medical Decision Making Amount and/or Complexity of Data Reviewed Labs: ordered. Radiology: ordered.  Risk Prescription drug management.   Patient presents to the emergency department for evaluation of elbow and ankle injury after a fall.  Patient with swelling, bruising, decreased range of motion of the right ankle.  X-ray shows bimalleolar fracture.  He has had previous surgery of the distal tibia, does not remember who performed the operation.  He has seen multiple orthopedic groups.  Most recently he was seen by Raliegh Ip.  X-ray of elbow was negative.  He does take Xarelto.  CT head was performed and is negative.  No neck pain.  Recommend holding Xarelto secondary to risk of increased swelling and bleeding.  He is on a for A-fib and currently is in sinus rhythm.  There is a slight increased risk of stroke, but risk of bleeding is greater at this point.  He will need to be off of it for surgery.  SPLINT APPLICATION Date/Time: 2:40 AM Authorized by: Orpah Greek Consent: Verbal consent obtained. Risks and benefits: risks, benefits and alternatives were discussed Consent given by: patient Splint applied by: nurse and ed technician Location details: lower leg Splint type: posterior ankle and stirrup   Supplies used: orthoglass Post-procedure: The splinted body part was neurovascularly unchanged following the procedure. Patient tolerance: Patient tolerated the procedure well with no immediate complications.          Final Clinical Impression(s) / ED Diagnoses Final diagnoses:  Closed bimalleolar fracture of right ankle, initial encounter    Rx / DC Orders ED Discharge Orders          Ordered    oxyCODONE-acetaminophen (PERCOCET/ROXICET) 5-325 MG tablet  Every 6 hours PRN        03/14/21 0208    oxyCODONE-acetaminophen (PERCOCET) 5-325 MG tablet  Every 4 hours PRN        03/14/21 0210               Orpah Greek, MD 03/14/21 0231

## 2021-03-14 NOTE — ED Notes (Signed)
Assisted patient to wheelchair. D/c papers received. Prepack given to family. Assisted into vehicle. Family member understood papers and instructions.  ?

## 2021-03-15 DIAGNOSIS — M25571 Pain in right ankle and joints of right foot: Secondary | ICD-10-CM | POA: Diagnosis not present

## 2021-03-15 MED FILL — Oxycodone w/ Acetaminophen Tab 5-325 MG: ORAL | Qty: 6 | Status: AC

## 2021-03-15 NOTE — H&P (Signed)
PREOPERATIVE H&P  Chief Complaint: RIGHT ANKLE FRACTURE  HPI: Brett Matthews is a 88 y.o. male who presents with a diagnosis of RIGHT ANKLE FRACTURE. Symptoms are rated as moderate to severe, and have been worsening.  This is significantly impairing activities of daily living.  He has elected for surgical management.   Past Medical History:  Diagnosis Date   Anemia, iron deficiency    Negative capsule endoscopy   Arthritis    Coronary atherosclerosis of native coronary artery    60 % circumflex stenosis; EF 65%, Cath 6/13   Diverticula of colon    Pancolonic   DM (diabetes mellitus), type 2, uncontrolled    Dyspnea    Normal cardiopulmonary function test in July 2008, negative echocardiogram with "bubble" study for inter-cardiac shunt.   Essential hypertension, benign    Gastroesophageal reflux disease    Hemorrhoids    Hyperplastic colon polyp 04/06/2007   Hypothyroidism 2003   Status post total thyroidectomy   Migraines    Neoplasm of lymphatic and hematopoietic tissue    Paroxysmal atrial fibrillation ([place])    Diagnosed 2005   Skin cancer    Past Surgical History:  Procedure Laterality Date   BACK SURGERY     BONE MARROW BIOPSY  1990's   CARDIAC CATHETERIZATION     CARDIOVERSION N/A 01/18/2019   Procedure: CARDIOVERSION;  Surgeon: Adrian Prows, MD;  Location: [place];  Service: Cardiovascular;  Laterality: N/A;   CARDIOVERSION N/A 03/20/2020   Procedure: CARDIOVERSION;  Surgeon: Adrian Prows, MD;  Location: [place];  Service: Cardiovascular;  Laterality: N/A;   CATARACT EXTRACTION Bilateral    COLONOSCOPY  04/06/2007   Dr. Gala Romney- Normal rectum with scattered pancolonic diverticula and slightly redundant elongated colon, diminutive polpy midsigmoid, remainder of colonic mucosa appeared normal. bx= hyperplastic polyp   COLONOSCOPY N/A 11/29/2012   PQD:IYMEBRAX preparation. Friable anal canal/internal hemorrhoids; otherwise, normal rectum. Normal-appearing colonic  mucosa   COLONOSCOPY WITH PROPOFOL N/A 03/12/2017   Procedure: COLONOSCOPY WITH PROPOFOL;  Surgeon: Daneil Dolin, MD;  Location: [facility];  Service: Endoscopy;  Laterality: N/A;  7:30am   ESOPHAGOGASTRODUODENOSCOPY  04/06/2007   Dr. Gala Romney- normal esophagus s/p nissen fundoplication, intact nissen wrap o/w normal stomach   ESOPHAGOGASTRODUODENOSCOPY N/A 11/29/2012   RMR: Abnormall distal esophagus bx c/w GERD. prior fundoplication. Gastric polyps  -bx benign. Status post biopsy of normal--appearing duodenal mucosa (bx neg)   HEMORRHOID SURGERY N/A 04/06/2017   Procedure: EXTENSIVE HEMORRHOIDECTOMY;  Surgeon: Aviva Signs, MD;  Location: [facility];  Service: General;  Laterality: N/A;   LEFT HEART CATHETERIZATION WITH CORONARY ANGIOGRAM N/A 06/27/2011   Procedure: LEFT HEART CATHETERIZATION WITH CORONARY ANGIOGRAM;  Surgeon: Thayer Headings, MD;  Location[facility] LAB;  Service: Cardiovascular;  Laterality: N/A;   NISSEN FUNDOPLICATION     POLYPECTOMY  03/12/2017   Procedure: POLYPECTOMY;  Surgeon: Daneil Dolin, MD;  Location: [facility];  Service: Endoscopy;;  cecal x3   POSTERIOR LAMINECTOMY / DECOMPRESSION LUMBAR SPINE  ~ 1990's   SKIN CANCER EXCISION     "off my back and chest; from sun"   Social History   Socioeconomic History   Marital status: Divorced    Spouse name: Not on file   Number of children: 2   Years of education: Not on file   Highest education level: Not on file  Occupational History   Occupation: Full Time  Tobacco Use   Smoking status: Never   Smokeless tobacco: Never  Vaping Use  Vaping Use: Never used  Substance and Sexual Activity   Alcohol use: No   Drug use: No   Sexual activity: Never  Other Topics Concern   Not on file  Social History Narrative   Not on file   Social Determinants of Health   Financial Resource Strain: Not on file  Food Insecurity: Not on file  Transportation Needs: Not on file  Physical Activity: Not on file  Stress: Not  on file  Social Connections: Not on file   Family History  Problem Relation Age of Onset   Hypertension Mother    Stroke Mother    Cancer Father    Cancer Sister    Stroke Brother    Cancer Other    Stroke Other    Colon cancer Neg Hx    No Known Allergies Prior to Admission medications   Medication Sig Start Date End Date Taking? Authorizing Provider  acetaminophen (TYLENOL) 500 MG tablet Take 1,000 mg by mouth every 8 (eight) hours as needed for moderate pain.    [provider]  albuterol (VENTOLIN HFA) 108 (90 Base) MCG/ACT inhaler Inhale 1-2 puffs into the lungs every 6 (six) hours as needed for wheezing or shortness of breath.    [provider]  atorvastatin (LIPITOR) 10 MG tablet Take 1 tablet (10 mg total) by mouth daily. 02/02/20   Adrian Prows, MD  benazepril (LOTENSIN) 20 MG tablet Take 1 tablet (20 mg total) by mouth daily. 11/02/20   Cantwell, Celeste C, PA-C  carvedilol (COREG) 12.5 MG tablet Take 12.5 mg by mouth 2 (two) times daily with a meal.    [provider]  Cholecalciferol (D3-1000) 25 MCG (1000 UT) capsule Take 1,000 Units by mouth every evening.    [provider]  citalopram (CELEXA) 40 MG tablet Take 40 mg by mouth daily. 03/23/17   [provider]  clonazePAM (KLONOPIN) 1 MG tablet Take 1 mg by mouth at bedtime. For sleep    [provider]  diltiazem (CARDIZEM CD) 240 MG 24 hr capsule Take 240 mg by mouth daily.    [provider]  docusate sodium (COLACE) 100 MG capsule Take 100 mg by mouth every evening.    [provider]  famotidine (PEPCID) 40 MG tablet Take 40 mg by mouth daily. 03/04/20   [provider]  fluticasone (FLONASE) 50 MCG/ACT nasal spray Place 1 spray into both nostrils daily as needed for allergies.    [provider]  folic acid (FOLVITE) 1 MG tablet Take 1 tablet by mouth daily at 12 noon. 03/12/17   [provider]  gabapentin (NEURONTIN) 300  MG capsule Take 300 mg by mouth 3 (three) times daily.    [provider]  glimepiride (AMARYL) 2 MG tablet Take 2 mg by mouth 2 (two) times daily. 03/10/17   [provider]  Insulin Degludec (TRESIBA) 100 UNIT/ML SOLN Inject 18 Units into the skin daily.    [provider]  levothyroxine (SYNTHROID) 125 MCG tablet Take 125 mcg by mouth daily at 2 am. SYNTHROID - brand name only    [provider]  Melatonin 10 MG TABS Take 10 mg by mouth at bedtime.    [provider]  metFORMIN (GLUCOPHAGE) 500 MG tablet Take 500-1,000 mg by mouth See admin instructions. Take 2 tabs (1,$RemoveB'000mg'KKGbjzuh$ ) by mouth every morning & 1 tab ($Remo'500mg'YgVMP$ ) every evening    [provider]  oxyCODONE-acetaminophen (PERCOCET) 5-325 MG tablet Take 1-2 tablets by  mouth every 4 (four) hours as needed. 03/14/21   Orpah Greek, MD  oxyCODONE-acetaminophen (PERCOCET/ROXICET) 5-325 MG tablet Take 1 tablet by mouth every 6 (six) hours as needed for severe pain. 03/14/21   Orpah Greek, MD  pantoprazole (PROTONIX) 40 MG tablet Take 40 mg by mouth daily at 12 noon.    [provider]  polyethylene glycol (MIRALAX / GLYCOLAX) 17 g packet Take 17 g by mouth daily.    [provider]  tamsulosin (FLOMAX) 0.4 MG CAPS capsule Take 0.4 mg by mouth daily after breakfast. 09/09/12   [provider]  vitamin B-12 (CYANOCOBALAMIN) 1000 MCG tablet Take 1,000 mcg by mouth daily.    [provider]  XARELTO 15 MG TABS tablet Take 15 mg by mouth every evening. 02/08/20   [provider]  zinc gluconate 50 MG tablet Take 50 mg by mouth daily.    [provider]     Positive ROS: All other systems have been reviewed and were otherwise negative with the exception of those mentioned in the HPI and as above.  Physical Exam: General: Alert, no acute distress Cardiovascular: No pedal edema Respiratory: No cyanosis, no use of accessory  musculature GI: No organomegaly, abdomen is soft and non-tender Skin: No lesions in the area of chief complaint Neurologic: Sensation intact distally Psychiatric: Patient is competent for consent with normal mood and affect Lymphatic: No axillary or cervical lymphadenopathy  MUSCULOSKELETAL: TTP medial and lateral aspect of right ankle, limited ROM d/t pain, edema and ecchymosis present, NVI   Imaging: 3v ankle xrays show an acute oblique fracture through the distal fibular diaphysis at and above the level of the ankle mortise. Fracture fragments are distracted 4 mm. There is an acute transverse fracture through the medial malleolus. The fracture fragments are distracted 5 mm with mild medial displacement of the proximal fracture fragment. Anteromedial subluxation of the distal talus in relation to the talar dome.   Assessment: RIGHT ANKLE FRACTURE  Plan: Plan for Procedure(s): OPEN REDUCTION INTERNAL FIXATION (ORIF) ANKLE FRACTURE  The risks benefits and alternatives were discussed with the patient including but not limited to the risks of nonoperative treatment, versus surgical intervention including infection, bleeding, nerve injury,  blood clots, cardiopulmonary complications, morbidity, mortality, among others, and they were willing to proceed.   Weightbearing: NWB RLE Orthopedic devices: splint Showering: keep splint dry Dressing: splint Medicines: takes daily Xarelto, Norco 10, Baclofen, Zofran  Discharge: home Follow up: 2 weeks    Alisa Graff Office [phone] 03/15/2021 4:57 PM

## 2021-03-18 ENCOUNTER — Other Ambulatory Visit: Payer: Self-pay

## 2021-03-18 ENCOUNTER — Encounter (HOSPITAL_COMMUNITY): Payer: Self-pay | Admitting: Orthopedic Surgery

## 2021-03-18 NOTE — Progress Notes (Signed)
Anesthesia Chart Review ? ? Case: [id number] Date/Time: 03/19/21 1211  ? Procedure: OPEN REDUCTION INTERNAL FIXATION (ORIF) ANKLE FRACTURE (Right: Ankle)  ? Anesthesia type: Choice  ? Pre-op diagnosis: RIGHT ANKLE FRACTURE  ? Location: [facility]OM 08 / [facility]  ? Surgeons: Renette Butters, MD  ? ?  ? ? ?DISCUSSION:88 y.o. never smoker with h/o HTN, DM II, CAD, PAF, right ankle fracture scheduled for above procedure 03/19/2021 with Dr. Edmonia Lynch.  ? ?Pt last seen by cardiology 02/04/2021. Stable at this visit.  ? ?Pt reports last dose of Xarelto 03/13/21.  ? ?Anticipate pt can proceed with planned procedure barring acute status change.   ?VS: Ht '5\' 11"'$  (1.803 m)   Wt 86.2 kg   BMI 26.50 kg/m?  ? ?PROVIDERS: ?Glenda Chroman, MD is PCP  ? ?Cardiologist - Rozann Lesches, MD ?  ?LABS: Labs reviewed: Acceptable for surgery. ?(all labs ordered are listed, but only abnormal results are displayed) ? ?Labs Reviewed - No data to display ? ? ?IMAGES: ? ? ?EKG: ?03/14/2021 ?Rate 85 bpm  ?Sinus rhythm  ?Atrial premature complex ?RBBB and LAFB ? ?CV: ?Echocardiogram 02/02/2020:  ?Rhythm: Atrial Flutter.  ?Normal LV systolic function with visual EF 50-55%. Left ventricle cavity  ?is normal in size. Normal global wall motion. Unable to evaluate diastolic  ?function due to atrial flutter. Elevated LAP. Calculated EF 62%.  ?Left atrial cavity is mildly dilated.  ?Right atrial cavity is mildly dilated.  ?Mild (Grade I) mitral regurgitation.  ?Mild tricuspid regurgitation.  ?Compared to prior study dated 12/09/2018: Biatrial enlargement is new,  ?LVEF 65% and now 50-555%, otherwise no significant change.  ?Past Medical History:  ?Diagnosis Date  ? Anemia, iron deficiency   ? Negative capsule endoscopy  ? Arthritis   ? Coronary atherosclerosis of native coronary artery   ? 60 % circumflex stenosis; EF 65%, Cath 6/13  ? Diverticula of colon   ? Pancolonic  ? DM (diabetes mellitus), type 2, uncontrolled   ? Dyspnea   ? Normal cardiopulmonary  function test in July 2008, negative echocardiogram with "bubble" study for inter-cardiac shunt.  ? Essential hypertension, benign   ? Gastroesophageal reflux disease   ? Hemorrhoids   ? Hyperplastic colon polyp 04/06/2007  ? Hypothyroidism 2003  ? Status post total thyroidectomy  ? Memory difficulties   ? Migraines   ? Neoplasm of lymphatic and hematopoietic tissue   ? Paroxysmal atrial fibrillation (HCC)   ? Diagnosed 2005  ? Skin cancer   ? ? ?Past Surgical History:  ?Procedure Laterality Date  ? BACK SURGERY    ? BONE MARROW BIOPSY  1990's  ? CARDIAC CATHETERIZATION    ? CARDIOVERSION N/A 01/18/2019  ? Procedure: CARDIOVERSION;  Surgeon: Adrian Prows, MD;  Location: [place];  Service: Cardiovascular;  Laterality: N/A;  ? CARDIOVERSION N/A 03/20/2020  ? Procedure: CARDIOVERSION;  Surgeon: Adrian Prows, MD;  Location: [facility] ENDOSCOPY;  Service: Cardiovascular;  Laterality: N/A;  ? CATARACT EXTRACTION Bilateral   ? COLONOSCOPY  04/06/2007  ? Dr. Gala Romney- Normal rectum with scattered pancolonic diverticula and slightly redundant elongated colon, diminutive polpy midsigmoid, remainder of colonic mucosa appeared normal. bx= hyperplastic polyp  ? COLONOSCOPY N/A 11/29/2012  ? NTI:[id number] preparation. Friable anal canal/internal hemorrhoids; otherwise, normal rectum. Normal-appearing colonic mucosa  ? COLONOSCOPY WITH PROPOFOL N/A 03/12/2017  ? Procedure: COLONOSCOPY WITH PROPOFOL;  Surgeon: Daneil Dolin, MD;  [facility];  Service: Endoscopy;  Laterality: N/A;  7:30am  ? ESOPHAGOGASTRODUODENOSCOPY  04/06/2007  ? Dr. Gala Romney- normal esophagus s/p nissen fundoplication, intact nissen wrap o/w normal stomach  ? ESOPHAGOGASTRODUODENOSCOPY N/A 11/29/2012  ? RMR: Abnormall distal esophagus bx c/w GERD. prior fundoplication. Gastric polyps  -bx benign. Status post biopsy of normal--appearing duodenal mucosa (bx neg)  ? HEMORRHOID SURGERY N/A 04/06/2017  ? Procedure: EXTENSIVE HEMORRHOIDECTOMY;  Surgeon: Aviva Signs, MD;   [facility];  Service: General;  Laterality: N/A;  ? LEFT HEART CATHETERIZATION WITH CORONARY ANGIOGRAM N/A 06/27/2011  ? Procedure: LEFT HEART CATHETERIZATION WITH CORONARY ANGIOGRAM;  Surgeon: Thayer Headings, MD;  Location: [facility] CATH LAB;  Service: Cardiovascular;  Laterality: N/A;  ? NISSEN FUNDOPLICATION    ? POLYPECTOMY  03/12/2017  ? Procedure: POLYPECTOMY;  Surgeon: Daneil Dolin, MD;  [facility];  Service: Endoscopy;;  cecal x3  ? POSTERIOR LAMINECTOMY / DECOMPRESSION LUMBAR SPINE  ~ 1990's  ? SKIN CANCER EXCISION    ? "off my back and chest; from sun"  ? ? ?MEDICATIONS: ?No current facility-administered medications for this encounter.  ? ? acetaminophen (TYLENOL) 500 MG tablet  ? albuterol (VENTOLIN HFA) 108 (90 Base) MCG/ACT inhaler  ? atorvastatin (LIPITOR) 10 MG tablet  ? benazepril (LOTENSIN) 20 MG tablet  ? carvedilol (COREG) 12.5 MG tablet  ? Cholecalciferol (D3-1000) 25 MCG (1000 UT) capsule  ? citalopram (CELEXA) 40 MG tablet  ? clonazePAM (KLONOPIN) 1 MG tablet  ? diltiazem (CARDIZEM CD) 240 MG 24 hr capsule  ? docusate sodium (COLACE) 100 MG capsule  ? famotidine (PEPCID) 40 MG tablet  ? fluticasone (FLONASE) 50 MCG/ACT nasal spray  ? folic acid (FOLVITE) 1 MG tablet  ? gabapentin (NEURONTIN) 300 MG capsule  ? glimepiride (AMARYL) 2 MG tablet  ? Insulin Degludec (TRESIBA) 100 UNIT/ML SOLN  ? levothyroxine (SYNTHROID) 125 MCG tablet  ? Melatonin 10 MG TABS  ? metFORMIN (GLUCOPHAGE) 500 MG tablet  ? oxyCODONE-acetaminophen (PERCOCET) 5-325 MG tablet  ? oxyCODONE-acetaminophen (PERCOCET/ROXICET) 5-325 MG tablet  ? pantoprazole (PROTONIX) 40 MG tablet  ? polyethylene glycol (MIRALAX / GLYCOLAX) 17 g packet  ? tamsulosin (FLOMAX) 0.4 MG CAPS capsule  ? vitamin B-12 (CYANOCOBALAMIN) 1000 MCG tablet  ? XARELTO 15 MG TABS tablet  ? zinc gluconate 50 MG tablet  ? ? ? ?Konrad Felix Ward, PA-C ?[facility] ?[phone] ?  ? ? ? ? ?

## 2021-03-18 NOTE — Patient Instructions (Signed)
DUE TO COVID-19 ONLY ONE VISITOR  (aged 86 and older)  IS ALLOWED TO COME WITH YOU AND STAY IN THE WAITING ROOM ONLY DURING PRE OP AND PROCEDURE.   ?**NO VISITORS ARE ALLOWED IN THE SHORT STAY AREA OR RECOVERY ROOM!!** ? ?You are not required to quarantine, however you are required to wear a well-fitted mask when you are out and around people not in your household.  ?Hand Hygiene often ?Do NOT share personal items ?Notify your provider if you are in close contact with someone who has COVID or you develop fever 100.4 or greater, new onset of sneezing, cough, sore throat, shortness of breath or body aches. ?     ? Your procedure is scheduled on:  Tuesday, 03-19-21 ? ? Report to [facility] Main Entrance ? ?  Report to admitting at 10:15 AM ? ? Call this number if you have problems the morning of surgery [phone] ? ? Do not eat food :After Midnight. ? ? After Midnight you may have the following liquids until 9:15 AM DAY OF SURGERY ? ?Water ?Black Coffee (sugar ok, NO MILK/CREAM OR CREAMERS)  ?Tea (sugar ok, NO MILK/CREAM OR CREAMERS) regular and decaf                             ?Plain Jell-O (NO RED)                                           ?Fruit ices (not with fruit pulp, NO RED)                                     ?Popsicles (NO RED)                                                                  ?Juice: apple, WHITE grape, WHITE cranberry ?Sports drinks like Gatorade (NO RED) ?Clear broth(vegetable,chicken,beef) ?        ? ?FOLLOW AND ANY ADDITIONAL PRE OP INSTRUCTIONS YOU RECEIVED FROM YOUR SURGEON'S OFFICE!!! ?  ?  ?Oral Hygiene is also important to reduce your risk of infection.                                    ?Remember - BRUSH YOUR TEETH THE MORNING OF SURGERY WITH YOUR REGULAR TOOTHPASTE ? ? Do NOT smoke after Midnight ? ?Take these medicines the morning of surgery with A SIP OF WATER:  Tylenol, Atorvastatin, Carvediolol, Gabapentin, Citalopram, Diltiazem, Pepcid, Levothyroxine,  Tamsulosin ? ?How to Manage Your Diabetes ?Before and After Surgery ? ?Why is it important to control my blood sugar before and after surgery? ?Improving blood sugar levels before and after surgery helps healing and can limit problems. ?A way of improving blood sugar control is eating a healthy diet by: ? Eating less sugar and carbohydrates ? Increasing activity/exercise ? Talking with your doctor about reaching your blood sugar goals ?High blood sugars (greater than 180 mg/dL) can  raise your risk of infections and slow your recovery, so you will need to focus on controlling your diabetes during the weeks before surgery. ?Make sure that the doctor who takes care of your diabetes knows about your planned surgery including the date and location. ? ?How do I manage my blood sugar before surgery? ?Check your blood sugar at least 4 times a day, starting 2 days before surgery, to make sure that the level is not too high or low. ?Check your blood sugar the morning of your surgery when you wake up and every 2 hours until you get to the Short Stay unit. ?If your blood sugar is less than 70 mg/dL, you will need to treat for low blood sugar: ?Do not take insulin. ?Treat a low blood sugar (less than 70 mg/dL) with ? cup of clear juice (cranberry or apple), 4 glucose tablets, OR glucose gel. ?Recheck blood sugar in 15 minutes after treatment (to make sure it is greater than 70 mg/dL). If your blood sugar is not greater than 70 mg/dL on recheck, call [phone] for further instructions. ?Report your blood sugar to the short stay nurse when you get to Short Stay. ? ?If you are admitted to the hospital after surgery: ?Your blood sugar will be checked by the staff and you will probably be given insulin after surgery (instead of oral diabetes medicines) to make sure you have good blood sugar levels. ?The goal for blood sugar control after surgery is 80-180 mg/dL. ? ? ?WHAT DO I DO ABOUT MY DIABETES MEDICATION? ? ?Do not take oral  diabetes medicines (pills) the morning of surgery. ? ?THE DAY BEFORE SURGERY:  Take Metformin as prescribed ?             Take Glimperide (Amaryl) as prescribed, no evening or bedtime dose. ?  Take 50% of Insulin Degludec Tyler Aas)     ? ? ?THE MORNING OF SURGERY:  Do not take Metformin, Glimperide (Amaryl) or Insulin Degludec Tyler Aas). ? ?Reviewed and Endorsed by [facility] Patient Education Committee, August 2015  ?                  ?           You may not have any metal on your body including jewelry, and body piercing ? ?           Do not wear lotions, powders, cologne, or deodorant ? ?            Men may shave face and neck. ? ? Do not bring valuables to the hospital. [place]. ? ? Contacts, dentures or bridgework may not be worn into surgery. ?   ?Patients discharged on the day of surgery will not be allowed to drive home.  Someone NEEDS to stay with you for the first 24 hours after anesthesia. ? ?Special Instructions: Bring a copy of your healthcare power of attorney and living will documents         the day of surgery if you haven't scanned them before. ? ?Please read over the following fact sheets you were given: IF YOU HAVE QUESTIONS ABOUT YOUR PRE-OP INSTRUCTIONS PLEASE CALL [facility] ? ?[facility] - Preparing for Surgery ?Before surgery, you can play an important role.  Because skin is not sterile, your skin needs to be as free of germs as possible.  You can reduce the number of germs on your skin by washing with an  antibacterial soaps the night before surgery and the morning of surgery. ? ?Do not shave (including legs and underarms) for at least 48 hours prior to the first CHG shower.  You may shave your face/neck. ? ?Please follow these instructions carefully: ? 1.  Shower with an antibacterial Soap the night before surgery and the  morning of surgery. ? 2.  If you choose to wash your hair, wash your hair first as usual with your normal  shampoo. ? 3.   After you shampoo, rinse your hair and body thoroughly to remove the shampoo.                           4.   Wash face,  Genitals (private parts) with your normal soap. ?            6.  Wash thoroughly, paying special attention to the area where your  surgery  will be performed. ? 7.  Thoroughly rinse your body with warm water from the neck down. ? 8.  Pat yourself dry with a clean towel. ?           9.  Wear clean pajamas. ?           10.  Place clean sheets on your bed the night of your first shower and do not  sleep with pets. ?Day of Surgery : ?Do not apply any lotions/deodorants the morning of surgery.  Please wear clean clothes to the hospital/surgery center. ? ?FAILURE TO FOLLOW THESE INSTRUCTIONS MAY RESULT IN THE CANCELLATION OF YOUR SURGERY ? ?PATIENT SIGNATURE_________________________________ ? ?NURSE SIGNATURE__________________________________ ? ?_______________________________________________________________________  ? ?

## 2021-03-18 NOTE — Progress Notes (Addendum)
COVID swab appointment: N/A ? ?COVID Vaccine Completed:  Yes x2 ?Date COVID Vaccine completed: ?Has received booster: ?COVID vaccine manufacturer: [employer]  ? ?Date of COVID positive in last 90 days:  No ? ?PCP - Jerene Bears, MD ?Cardiologist - Rozann Lesches, MD ? ?Chest x-ray - 01-22-21 Epic ?EKG - 03-14-21 Epic ?Stress Test - greater than 2 years Epic ?ECHO - 02-02-20 Epic ?Cardiac Cath -  ?Pacemaker/ICD device last checked: ?Spinal Cord Stimulator: ? ?Bowel Prep - N/A ? ?Sleep Study - No ?CPAP -  ? ?Fasting Blood Sugar -  ?Checks Blood Sugar - Does not check per caregiver  ? ?Blood Thinner Instructions:  Xarelto.  Last dose was 03-13-21 ?Aspirin Instructions: ?Last Dose: ? ?Activity level:  Currently unable to climb stairs due to ankle fracture.  Prior to fracture able to go up a flight of stairs and perform activities of daily living without stopping and without symptoms of chest pain or shortness of breath. ?   ?Anesthesia review: Afib, HTN, DM ? ?Patient denies shortness of breath, fever, cough and chest pain at PAT appointment (completed over the phone with caregiver) ? ? ?Patient verbalized understanding of instructions that were given to them at the PAT appointment. Patient was also instructed that they will need to review over the PAT instructions again at home before surgery.  ?

## 2021-03-19 ENCOUNTER — Ambulatory Visit (HOSPITAL_COMMUNITY): Payer: PPO | Admitting: Certified Registered Nurse Anesthetist

## 2021-03-19 ENCOUNTER — Inpatient Hospital Stay (HOSPITAL_COMMUNITY)
Admission: RE | Admit: 2021-03-19 | Discharge: 2021-03-23 | DRG: 494 | Disposition: A | Discharge status: Skilled Nursing Facility | Payer: PPO | Attending: Orthopedic Surgery | Admitting: Orthopedic Surgery

## 2021-03-19 ENCOUNTER — Encounter (HOSPITAL_COMMUNITY)
Admission: RE | Disposition: A | Discharge status: Skilled Nursing Facility | Payer: Self-pay | Source: Home / Self Care | Attending: Orthopedic Surgery

## 2021-03-19 ENCOUNTER — Ambulatory Visit (HOSPITAL_COMMUNITY): Payer: PPO

## 2021-03-19 ENCOUNTER — Other Ambulatory Visit: Payer: Self-pay

## 2021-03-19 ENCOUNTER — Encounter (HOSPITAL_COMMUNITY): Payer: Self-pay | Admitting: Orthopedic Surgery

## 2021-03-19 DIAGNOSIS — Z794 Long term (current) use of insulin: Secondary | ICD-10-CM | POA: Diagnosis not present

## 2021-03-19 DIAGNOSIS — R410 Disorientation, unspecified: Secondary | ICD-10-CM | POA: Insufficient documentation

## 2021-03-19 DIAGNOSIS — Z743 Need for continuous supervision: Secondary | ICD-10-CM | POA: Diagnosis not present

## 2021-03-19 DIAGNOSIS — S82891A Other fracture of right lower leg, initial encounter for closed fracture: Secondary | ICD-10-CM

## 2021-03-19 DIAGNOSIS — R4189 Other symptoms and signs involving cognitive functions and awareness: Secondary | ICD-10-CM | POA: Diagnosis present

## 2021-03-19 DIAGNOSIS — R262 Difficulty in walking, not elsewhere classified: Secondary | ICD-10-CM | POA: Diagnosis not present

## 2021-03-19 DIAGNOSIS — H9190 Unspecified hearing loss, unspecified ear: Secondary | ICD-10-CM | POA: Diagnosis present

## 2021-03-19 DIAGNOSIS — Z85828 Personal history of other malignant neoplasm of skin: Secondary | ICD-10-CM | POA: Diagnosis not present

## 2021-03-19 DIAGNOSIS — Z79899 Other long term (current) drug therapy: Secondary | ICD-10-CM

## 2021-03-19 DIAGNOSIS — S82841A Displaced bimalleolar fracture of right lower leg, initial encounter for closed fracture: Principal | ICD-10-CM | POA: Diagnosis present

## 2021-03-19 DIAGNOSIS — I2510 Atherosclerotic heart disease of native coronary artery without angina pectoris: Secondary | ICD-10-CM

## 2021-03-19 DIAGNOSIS — E890 Postprocedural hypothyroidism: Secondary | ICD-10-CM | POA: Diagnosis present

## 2021-03-19 DIAGNOSIS — G8918 Other acute postprocedural pain: Secondary | ICD-10-CM | POA: Diagnosis not present

## 2021-03-19 DIAGNOSIS — I4891 Unspecified atrial fibrillation: Secondary | ICD-10-CM

## 2021-03-19 DIAGNOSIS — E039 Hypothyroidism, unspecified: Secondary | ICD-10-CM | POA: Diagnosis not present

## 2021-03-19 DIAGNOSIS — Z79890 Hormone replacement therapy (postmenopausal): Secondary | ICD-10-CM

## 2021-03-19 DIAGNOSIS — I10 Essential (primary) hypertension: Secondary | ICD-10-CM | POA: Diagnosis present

## 2021-03-19 DIAGNOSIS — D649 Anemia, unspecified: Secondary | ICD-10-CM | POA: Diagnosis present

## 2021-03-19 DIAGNOSIS — E876 Hypokalemia: Secondary | ICD-10-CM

## 2021-03-19 DIAGNOSIS — H9193 Unspecified hearing loss, bilateral: Secondary | ICD-10-CM | POA: Diagnosis not present

## 2021-03-19 DIAGNOSIS — I480 Paroxysmal atrial fibrillation: Secondary | ICD-10-CM | POA: Diagnosis present

## 2021-03-19 DIAGNOSIS — W1830XA Fall on same level, unspecified, initial encounter: Secondary | ICD-10-CM | POA: Diagnosis present

## 2021-03-19 DIAGNOSIS — Z7901 Long term (current) use of anticoagulants: Secondary | ICD-10-CM | POA: Diagnosis not present

## 2021-03-19 DIAGNOSIS — R451 Restlessness and agitation: Secondary | ICD-10-CM | POA: Diagnosis not present

## 2021-03-19 DIAGNOSIS — K219 Gastro-esophageal reflux disease without esophagitis: Secondary | ICD-10-CM | POA: Diagnosis present

## 2021-03-19 DIAGNOSIS — N400 Enlarged prostate without lower urinary tract symptoms: Secondary | ICD-10-CM

## 2021-03-19 DIAGNOSIS — M1990 Unspecified osteoarthritis, unspecified site: Secondary | ICD-10-CM | POA: Diagnosis present

## 2021-03-19 DIAGNOSIS — Z7984 Long term (current) use of oral hypoglycemic drugs: Secondary | ICD-10-CM | POA: Diagnosis not present

## 2021-03-19 DIAGNOSIS — F39 Unspecified mood [affective] disorder: Secondary | ICD-10-CM

## 2021-03-19 DIAGNOSIS — Z20822 Contact with and (suspected) exposure to covid-19: Secondary | ICD-10-CM | POA: Diagnosis present

## 2021-03-19 DIAGNOSIS — R531 Weakness: Secondary | ICD-10-CM | POA: Diagnosis not present

## 2021-03-19 DIAGNOSIS — E785 Hyperlipidemia, unspecified: Secondary | ICD-10-CM | POA: Diagnosis present

## 2021-03-19 DIAGNOSIS — R2689 Other abnormalities of gait and mobility: Secondary | ICD-10-CM | POA: Diagnosis not present

## 2021-03-19 DIAGNOSIS — S93421A Sprain of deltoid ligament of right ankle, initial encounter: Secondary | ICD-10-CM | POA: Diagnosis not present

## 2021-03-19 DIAGNOSIS — E119 Type 2 diabetes mellitus without complications: Secondary | ICD-10-CM | POA: Diagnosis present

## 2021-03-19 DIAGNOSIS — M6281 Muscle weakness (generalized): Secondary | ICD-10-CM | POA: Diagnosis not present

## 2021-03-19 DIAGNOSIS — S82841D Displaced bimalleolar fracture of right lower leg, subsequent encounter for closed fracture with routine healing: Secondary | ICD-10-CM | POA: Diagnosis not present

## 2021-03-19 DIAGNOSIS — S93431A Sprain of tibiofibular ligament of right ankle, initial encounter: Secondary | ICD-10-CM | POA: Diagnosis not present

## 2021-03-19 HISTORY — DX: Other amnesia: R41.3

## 2021-03-19 HISTORY — PX: ORIF ANKLE FRACTURE: SHX5408

## 2021-03-19 LAB — GLUCOSE, CAPILLARY
Glucose-Capillary: 139 mg/dL — ABNORMAL HIGH (ref 70–99)
Glucose-Capillary: 143 mg/dL — ABNORMAL HIGH (ref 70–99)
Glucose-Capillary: 321 mg/dL — ABNORMAL HIGH (ref 70–99)

## 2021-03-19 SURGERY — OPEN REDUCTION INTERNAL FIXATION (ORIF) ANKLE FRACTURE
Anesthesia: Spinal | Site: Ankle | Laterality: Right

## 2021-03-19 MED ORDER — ONDANSETRON 4 MG PO TBDP: 4.0000 mg | ORAL_TABLET | Freq: Two times a day (BID) | ORAL | 0 refills | Status: DC | PRN

## 2021-03-19 MED ORDER — DEXAMETHASONE SODIUM PHOSPHATE 10 MG/ML IJ SOLN
INTRAMUSCULAR | Status: AC
  Filled 2021-03-19: qty 1

## 2021-03-19 MED ORDER — TAMSULOSIN HCL 0.4 MG PO CAPS
0.4000 mg | ORAL_CAPSULE | Freq: Every day | ORAL | Status: DC
  Administered 2021-03-20 – 2021-03-22 (×3): 0.4 mg via ORAL
  Filled 2021-03-19 (×4): qty 1

## 2021-03-19 MED ORDER — 0.9 % SODIUM CHLORIDE (POUR BTL) OPTIME
TOPICAL | Status: DC | PRN
  Administered 2021-03-19: 1000 mL

## 2021-03-19 MED ORDER — FENTANYL CITRATE PF 50 MCG/ML IJ SOSY: 25.0000 ug | PREFILLED_SYRINGE | INTRAMUSCULAR | Status: DC | PRN

## 2021-03-19 MED ORDER — INSULIN DEGLUDEC 100 UNIT/ML ~~LOC~~ SOLN: 10.0000 [IU] | Freq: Every day | SUBCUTANEOUS | Status: DC

## 2021-03-19 MED ORDER — METHOCARBAMOL 500 MG PO TABS: 500.0000 mg | ORAL_TABLET | Freq: Four times a day (QID) | ORAL | Status: DC | PRN

## 2021-03-19 MED ORDER — LEVOTHYROXINE SODIUM 25 MCG PO TABS
125.0000 ug | ORAL_TABLET | Freq: Every day | ORAL | Status: DC
  Administered 2021-03-20 – 2021-03-22 (×2): 125 ug via ORAL
  Filled 2021-03-19 (×3): qty 1

## 2021-03-19 MED ORDER — INSULIN DEGLUDEC 100 UNIT/ML ~~LOC~~ SOLN: 18.0000 [IU] | Freq: Every day | SUBCUTANEOUS | Status: DC

## 2021-03-19 MED ORDER — METOCLOPRAMIDE HCL 5 MG/ML IJ SOLN: 5.0000 mg | Freq: Three times a day (TID) | INTRAMUSCULAR | Status: DC | PRN

## 2021-03-19 MED ORDER — PANTOPRAZOLE SODIUM 40 MG PO TBEC
40.0000 mg | DELAYED_RELEASE_TABLET | Freq: Every day | ORAL | Status: DC
  Administered 2021-03-20 – 2021-03-22 (×3): 40 mg via ORAL
  Filled 2021-03-19 (×4): qty 1

## 2021-03-19 MED ORDER — HYDROCODONE-ACETAMINOPHEN 10-325 MG PO TABS: 1.0000 | ORAL_TABLET | Freq: Four times a day (QID) | ORAL | 0 refills | Status: DC | PRN

## 2021-03-19 MED ORDER — CITALOPRAM HYDROBROMIDE 20 MG PO TABS
40.0000 mg | ORAL_TABLET | Freq: Every day | ORAL | Status: DC
  Administered 2021-03-19: 40 mg via ORAL
  Filled 2021-03-19: qty 1

## 2021-03-19 MED ORDER — ACETAMINOPHEN 500 MG PO TABS
ORAL_TABLET | ORAL | Status: AC
  Filled 2021-03-19: qty 2

## 2021-03-19 MED ORDER — HYDROCODONE-ACETAMINOPHEN 5-325 MG PO TABS
1.0000 | ORAL_TABLET | ORAL | Status: DC | PRN
  Administered 2021-03-20 – 2021-03-21 (×2): 1 via ORAL
  Filled 2021-03-19 (×3): qty 1

## 2021-03-19 MED ORDER — CARVEDILOL 12.5 MG PO TABS
12.5000 mg | ORAL_TABLET | Freq: Once | ORAL | Status: AC
  Administered 2021-03-19: 12.5 mg via ORAL
  Filled 2021-03-19: qty 1

## 2021-03-19 MED ORDER — BUPIVACAINE HCL (PF) 0.5 % IJ SOLN
INTRAMUSCULAR | Status: DC | PRN
  Administered 2021-03-19: 25 mL via PERINEURAL

## 2021-03-19 MED ORDER — INSULIN ASPART 100 UNIT/ML IJ SOLN
INTRAMUSCULAR | Status: AC
  Filled 2021-03-19: qty 1

## 2021-03-19 MED ORDER — GABAPENTIN 300 MG PO CAPS
300.0000 mg | ORAL_CAPSULE | Freq: Three times a day (TID) | ORAL | Status: DC
  Administered 2021-03-19 – 2021-03-22 (×10): 300 mg via ORAL
  Filled 2021-03-19 (×9): qty 1

## 2021-03-19 MED ORDER — ACETAMINOPHEN 325 MG PO TABS
325.0000 mg | ORAL_TABLET | Freq: Four times a day (QID) | ORAL | Status: DC | PRN
  Administered 2021-03-21: 650 mg via ORAL
  Filled 2021-03-19: qty 2

## 2021-03-19 MED ORDER — TRAMADOL HCL 50 MG PO TABS
ORAL_TABLET | ORAL | Status: AC
  Filled 2021-03-19: qty 1

## 2021-03-19 MED ORDER — PROPOFOL 1000 MG/100ML IV EMUL
INTRAVENOUS | Status: AC
  Filled 2021-03-19: qty 100

## 2021-03-19 MED ORDER — ZINC SULFATE 220 (50 ZN) MG PO CAPS
220.0000 mg | ORAL_CAPSULE | Freq: Every day | ORAL | Status: DC
  Administered 2021-03-20 – 2021-03-22 (×3): 220 mg via ORAL
  Filled 2021-03-19 (×3): qty 1

## 2021-03-19 MED ORDER — DOCUSATE SODIUM 100 MG PO CAPS: 100.0000 mg | ORAL_CAPSULE | Freq: Every evening | ORAL | Status: DC

## 2021-03-19 MED ORDER — LABETALOL HCL 5 MG/ML IV SOLN
5.0000 mg | INTRAVENOUS | Status: DC | PRN
  Filled 2021-03-19: qty 4

## 2021-03-19 MED ORDER — VITAMIN B-12 1000 MCG PO TABS
1000.0000 ug | ORAL_TABLET | Freq: Every day | ORAL | Status: DC
  Administered 2021-03-19 – 2021-03-22 (×4): 1000 ug via ORAL
  Filled 2021-03-19 (×4): qty 1

## 2021-03-19 MED ORDER — FENTANYL CITRATE (PF) 100 MCG/2ML IJ SOLN
INTRAMUSCULAR | Status: AC
  Filled 2021-03-19: qty 2

## 2021-03-19 MED ORDER — FOLIC ACID 1 MG PO TABS
1.0000 mg | ORAL_TABLET | Freq: Every day | ORAL | Status: DC
Start: 2021-03-20 — End: 2021-03-23
  Administered 2021-03-20 – 2021-03-22 (×3): 1 mg via ORAL
  Filled 2021-03-19 (×4): qty 1

## 2021-03-19 MED ORDER — INSULIN ASPART 100 UNIT/ML IJ SOLN
0.0000 [IU] | Freq: Every day | INTRAMUSCULAR | Status: DC
  Administered 2021-03-19: 4 [IU] via SUBCUTANEOUS

## 2021-03-19 MED ORDER — BUPIVACAINE IN DEXTROSE 0.75-8.25 % IT SOLN
INTRATHECAL | Status: DC | PRN
  Administered 2021-03-19: 1.8 mL via INTRATHECAL

## 2021-03-19 MED ORDER — ONDANSETRON HCL 4 MG/2ML IJ SOLN
INTRAMUSCULAR | Status: DC | PRN
  Administered 2021-03-19: 4 mg via INTRAVENOUS

## 2021-03-19 MED ORDER — FENTANYL CITRATE (PF) 100 MCG/2ML IJ SOLN
INTRAMUSCULAR | Status: DC | PRN
  Administered 2021-03-19: 25 ug via INTRAVENOUS

## 2021-03-19 MED ORDER — ONDANSETRON HCL 4 MG/2ML IJ SOLN
INTRAMUSCULAR | Status: AC
  Filled 2021-03-19: qty 2

## 2021-03-19 MED ORDER — INSULIN GLARGINE-YFGN 100 UNIT/ML ~~LOC~~ SOLN
10.0000 [IU] | Freq: Every day | SUBCUTANEOUS | Status: DC
  Administered 2021-03-19: 10 [IU] via SUBCUTANEOUS
  Filled 2021-03-19 (×2): qty 0.1

## 2021-03-19 MED ORDER — DILTIAZEM HCL ER COATED BEADS 240 MG PO CP24
240.0000 mg | ORAL_CAPSULE | Freq: Every day | ORAL | Status: DC
  Administered 2021-03-19 – 2021-03-22 (×4): 240 mg via ORAL
  Filled 2021-03-19 (×4): qty 1

## 2021-03-19 MED ORDER — PROPOFOL 10 MG/ML IV BOLUS
INTRAVENOUS | Status: DC | PRN
Start: 2021-03-19 — End: 2021-03-19
  Administered 2021-03-19: 20 mg via INTRAVENOUS
  Administered 2021-03-19: 30 mg via INTRAVENOUS

## 2021-03-19 MED ORDER — DIPHENHYDRAMINE HCL 12.5 MG/5ML PO ELIX
12.5000 mg | ORAL_SOLUTION | ORAL | Status: DC | PRN
  Filled 2021-03-19: qty 10

## 2021-03-19 MED ORDER — METHOCARBAMOL 500 MG IVPB - SIMPLE MED
500.0000 mg | Freq: Four times a day (QID) | INTRAVENOUS | Status: DC | PRN
  Filled 2021-03-19: qty 50

## 2021-03-19 MED ORDER — ATORVASTATIN CALCIUM 10 MG PO TABS
10.0000 mg | ORAL_TABLET | Freq: Every day | ORAL | Status: DC
  Administered 2021-03-19 – 2021-03-22 (×4): 10 mg via ORAL
  Filled 2021-03-19 (×4): qty 1

## 2021-03-19 MED ORDER — INSULIN ASPART 100 UNIT/ML IJ SOLN: 0.0000 [IU] | Freq: Three times a day (TID) | INTRAMUSCULAR | Status: DC

## 2021-03-19 MED ORDER — INSULIN ASPART 100 UNIT/ML IJ SOLN
0.0000 [IU] | Freq: Three times a day (TID) | INTRAMUSCULAR | Status: DC
  Administered 2021-03-20 (×2): 3 [IU] via SUBCUTANEOUS
  Administered 2021-03-21 (×2): 5 [IU] via SUBCUTANEOUS
  Administered 2021-03-22: 3 [IU] via SUBCUTANEOUS
  Administered 2021-03-22: 2 [IU] via SUBCUTANEOUS

## 2021-03-19 MED ORDER — LACTATED RINGERS IV SOLN: INTRAVENOUS | Status: DC

## 2021-03-19 MED ORDER — POLYETHYLENE GLYCOL 3350 17 G PO PACK
17.0000 g | PACK | Freq: Every day | ORAL | Status: DC
  Administered 2021-03-20 – 2021-03-21 (×2): 17 g via ORAL
  Filled 2021-03-19 (×2): qty 1

## 2021-03-19 MED ORDER — ONDANSETRON HCL 4 MG/2ML IJ SOLN: 4.0000 mg | Freq: Once | INTRAMUSCULAR | Status: DC | PRN

## 2021-03-19 MED ORDER — ORAL CARE MOUTH RINSE: 15.0000 mL | Freq: Once | OROMUCOSAL | Status: AC

## 2021-03-19 MED ORDER — METFORMIN HCL 500 MG PO TABS: 500.0000 mg | ORAL_TABLET | ORAL | Status: DC

## 2021-03-19 MED ORDER — OXYCODONE HCL 5 MG/5ML PO SOLN: 5.0000 mg | Freq: Once | ORAL | Status: DC | PRN

## 2021-03-19 MED ORDER — BACLOFEN 10 MG PO TABS: 10.0000 mg | ORAL_TABLET | Freq: Three times a day (TID) | ORAL | 0 refills | Status: DC | PRN

## 2021-03-19 MED ORDER — ROPIVACAINE HCL 7.5 MG/ML IJ SOLN
INTRAMUSCULAR | Status: DC | PRN
  Administered 2021-03-19: 20 mL via PERINEURAL

## 2021-03-19 MED ORDER — RIVAROXABAN 15 MG PO TABS
15.0000 mg | ORAL_TABLET | Freq: Every evening | ORAL | Status: DC
Start: 2021-03-20 — End: 2021-03-23
  Administered 2021-03-20 – 2021-03-22 (×3): 15 mg via ORAL
  Filled 2021-03-19 (×4): qty 1

## 2021-03-19 MED ORDER — ACETAMINOPHEN 500 MG PO TABS
1000.0000 mg | ORAL_TABLET | Freq: Once | ORAL | Status: AC
  Administered 2021-03-19: 1000 mg via ORAL
  Filled 2021-03-19: qty 2

## 2021-03-19 MED ORDER — DEXAMETHASONE SODIUM PHOSPHATE 10 MG/ML IJ SOLN
8.0000 mg | Freq: Once | INTRAMUSCULAR | Status: AC
  Administered 2021-03-19: 8 mg via INTRAVENOUS

## 2021-03-19 MED ORDER — TRAMADOL HCL 50 MG PO TABS
50.0000 mg | ORAL_TABLET | Freq: Four times a day (QID) | ORAL | Status: DC
  Administered 2021-03-19 – 2021-03-20 (×4): 50 mg via ORAL
  Filled 2021-03-19 (×4): qty 1

## 2021-03-19 MED ORDER — FENTANYL CITRATE PF 50 MCG/ML IJ SOSY
PREFILLED_SYRINGE | INTRAMUSCULAR | Status: AC
  Filled 2021-03-19: qty 2

## 2021-03-19 MED ORDER — ALBUTEROL SULFATE (2.5 MG/3ML) 0.083% IN NEBU: 3.0000 mL | INHALATION_SOLUTION | Freq: Four times a day (QID) | RESPIRATORY_TRACT | Status: DC | PRN

## 2021-03-19 MED ORDER — DOCUSATE SODIUM 100 MG PO CAPS
100.0000 mg | ORAL_CAPSULE | Freq: Two times a day (BID) | ORAL | Status: DC
  Administered 2021-03-19 – 2021-03-22 (×7): 100 mg via ORAL
  Filled 2021-03-19 (×7): qty 1

## 2021-03-19 MED ORDER — GABAPENTIN 300 MG PO CAPS
ORAL_CAPSULE | ORAL | Status: AC
  Filled 2021-03-19: qty 1

## 2021-03-19 MED ORDER — BUPIVACAINE HCL (PF) 0.5 % IJ SOLN
INTRAMUSCULAR | Status: AC
  Filled 2021-03-19: qty 30

## 2021-03-19 MED ORDER — FAMOTIDINE 20 MG PO TABS
40.0000 mg | ORAL_TABLET | Freq: Every day | ORAL | Status: DC
  Administered 2021-03-19 – 2021-03-22 (×4): 40 mg via ORAL
  Filled 2021-03-19 (×4): qty 2

## 2021-03-19 MED ORDER — ONDANSETRON HCL 4 MG/2ML IJ SOLN: 4.0000 mg | Freq: Four times a day (QID) | INTRAMUSCULAR | Status: DC | PRN

## 2021-03-19 MED ORDER — ACETAMINOPHEN 500 MG PO TABS
500.0000 mg | ORAL_TABLET | Freq: Four times a day (QID) | ORAL | Status: AC
  Administered 2021-03-19 – 2021-03-20 (×2): 500 mg via ORAL
  Filled 2021-03-19 (×2): qty 1

## 2021-03-19 MED ORDER — CHLORHEXIDINE GLUCONATE 0.12 % MT SOLN
15.0000 mL | Freq: Once | OROMUCOSAL | Status: AC
  Administered 2021-03-19: 15 mL via OROMUCOSAL

## 2021-03-19 MED ORDER — GLIMEPIRIDE 2 MG PO TABS
2.0000 mg | ORAL_TABLET | Freq: Two times a day (BID) | ORAL | Status: DC
  Administered 2021-03-20 – 2021-03-21 (×3): 2 mg via ORAL
  Filled 2021-03-19 (×3): qty 1

## 2021-03-19 MED ORDER — MORPHINE SULFATE (PF) 2 MG/ML IV SOLN
0.5000 mg | INTRAVENOUS | Status: DC | PRN
  Administered 2021-03-21: 1 mg via INTRAVENOUS
  Filled 2021-03-19: qty 1

## 2021-03-19 MED ORDER — METOCLOPRAMIDE HCL 5 MG PO TABS
5.0000 mg | ORAL_TABLET | Freq: Three times a day (TID) | ORAL | Status: DC | PRN
  Filled 2021-03-19: qty 2

## 2021-03-19 MED ORDER — ZINC GLUCONATE 50 MG PO TABS: 50.0000 mg | ORAL_TABLET | Freq: Every day | ORAL | Status: DC

## 2021-03-19 MED ORDER — BENAZEPRIL HCL 10 MG PO TABS
20.0000 mg | ORAL_TABLET | Freq: Every day | ORAL | Status: DC
Start: 2021-03-20 — End: 2021-03-20
  Filled 2021-03-19: qty 1

## 2021-03-19 MED ORDER — POVIDONE-IODINE 10 % EX SWAB: 2.0000 "application " | Freq: Once | CUTANEOUS | Status: DC

## 2021-03-19 MED ORDER — CEFAZOLIN SODIUM-DEXTROSE 2-4 GM/100ML-% IV SOLN
2.0000 g | INTRAVENOUS | Status: AC
  Administered 2021-03-19: 2 g via INTRAVENOUS
  Filled 2021-03-19: qty 100

## 2021-03-19 MED ORDER — OXYCODONE HCL 5 MG PO TABS: 5.0000 mg | ORAL_TABLET | Freq: Once | ORAL | Status: DC | PRN

## 2021-03-19 MED ORDER — METFORMIN HCL 500 MG PO TABS
500.0000 mg | ORAL_TABLET | Freq: Every day | ORAL | Status: DC
  Administered 2021-03-20: 500 mg via ORAL
  Filled 2021-03-19 (×2): qty 1

## 2021-03-19 MED ORDER — PROPOFOL 500 MG/50ML IV EMUL
INTRAVENOUS | Status: DC | PRN
  Administered 2021-03-19: 100 ug/kg/min via INTRAVENOUS

## 2021-03-19 MED ORDER — ONDANSETRON HCL 4 MG PO TABS
4.0000 mg | ORAL_TABLET | Freq: Four times a day (QID) | ORAL | Status: DC | PRN
  Filled 2021-03-19: qty 1

## 2021-03-19 MED ORDER — METFORMIN HCL 500 MG PO TABS
1000.0000 mg | ORAL_TABLET | Freq: Every day | ORAL | Status: DC
  Administered 2021-03-20 – 2021-03-21 (×2): 1000 mg via ORAL
  Filled 2021-03-19 (×3): qty 2

## 2021-03-19 MED ORDER — VITAMIN D 25 MCG (1000 UNIT) PO TABS
1000.0000 [IU] | ORAL_TABLET | Freq: Every evening | ORAL | Status: DC
  Administered 2021-03-19 – 2021-03-22 (×4): 1000 [IU] via ORAL
  Filled 2021-03-19 (×4): qty 1

## 2021-03-19 MED ORDER — CARVEDILOL 12.5 MG PO TABS
12.5000 mg | ORAL_TABLET | Freq: Two times a day (BID) | ORAL | Status: DC
  Administered 2021-03-20 – 2021-03-22 (×6): 12.5 mg via ORAL
  Filled 2021-03-19 (×7): qty 1

## 2021-03-19 MED ORDER — HYDROCODONE-ACETAMINOPHEN 7.5-325 MG PO TABS: 1.0000 | ORAL_TABLET | ORAL | Status: DC | PRN

## 2021-03-19 MED ORDER — FENTANYL CITRATE PF 50 MCG/ML IJ SOSY
25.0000 ug | PREFILLED_SYRINGE | Freq: Once | INTRAMUSCULAR | Status: AC
  Administered 2021-03-19: 50 ug via INTRAVENOUS

## 2021-03-19 SURGICAL SUPPLY — 76 items
APL PRP STRL LF DISP 70% ISPRP (MISCELLANEOUS) ×1
BAG COUNTER SPONGE SURGICOUNT (BAG) ×2 IMPLANT
BAG SPEC THK2 15X12 ZIP CLS (MISCELLANEOUS) ×1
BAG SPNG CNTER NS LX DISP (BAG) ×1
BAG ZIPLOCK 12X15 (MISCELLANEOUS) ×2 IMPLANT
BANDAGE ESMARK 6X9 LF (GAUZE/BANDAGES/DRESSINGS) ×1 IMPLANT
BIT DRILL 2.6 (BIT) ×2
BIT DRILL 2.6X VARIAX 2 (BIT) IMPLANT
BIT DRILL CANN 2.7 (BIT) ×2
BIT DRILL SRG 2.7XCANN AO CPLG (BIT) IMPLANT
BIT DRILL TWST MATTA 3.5MX195M (BIT) IMPLANT
BIT DRL 2.6X VARIAX 2 (BIT) ×1
BIT DRL SRG 2.7XCANN AO CPLNG (BIT) ×1
BLADE SURG 15 STRL LF DISP TIS (BLADE) ×1 IMPLANT
BLADE SURG 15 STRL SS (BLADE) ×2
BNDG CMPR 9X6 STRL LF SNTH (GAUZE/BANDAGES/DRESSINGS) ×1
BNDG COHESIVE 6X5 TAN ST LF (GAUZE/BANDAGES/DRESSINGS) ×2 IMPLANT
BNDG ELASTIC 4X5.8 VLCR STR LF (GAUZE/BANDAGES/DRESSINGS) ×2 IMPLANT
BNDG ELASTIC 6X5.8 VLCR STR LF (GAUZE/BANDAGES/DRESSINGS) ×1 IMPLANT
BNDG ESMARK 6X9 LF (GAUZE/BANDAGES/DRESSINGS) ×2
CHLORAPREP W/TINT 26 (MISCELLANEOUS) ×2 IMPLANT
CLSR STERI-STRIP ANTIMIC 1/2X4 (GAUZE/BANDAGES/DRESSINGS) IMPLANT
COVER MAYO STAND STRL (DRAPES) ×2 IMPLANT
COVER SURGICAL LIGHT HANDLE (MISCELLANEOUS) ×2 IMPLANT
CUFF TOURN SGL QUICK 34 (TOURNIQUET CUFF) ×2
CUFF TRNQT CYL 34X4.125X (TOURNIQUET CUFF) ×1 IMPLANT
DRAPE C-ARM 42X120 X-RAY (DRAPES) IMPLANT
DRAPE IMP U-DRAPE 54X76 (DRAPES) ×2 IMPLANT
DRAPE U-SHAPE 47X51 STRL (DRAPES) ×2 IMPLANT
DRILL TWIST AO MATTA 3.5MX195M (BIT) ×2
DRSG ADAPTIC 3X8 NADH LF (GAUZE/BANDAGES/DRESSINGS) ×1 IMPLANT
DRSG EMULSION OIL 3X3 NADH (GAUZE/BANDAGES/DRESSINGS) ×2 IMPLANT
DRSG PAD ABDOMINAL 8X10 ST (GAUZE/BANDAGES/DRESSINGS) ×3 IMPLANT
ELECT REM PT RETURN 15FT ADLT (MISCELLANEOUS) ×2 IMPLANT
GAUZE SPONGE 4X4 12PLY STRL (GAUZE/BANDAGES/DRESSINGS) ×2 IMPLANT
GLOVE SRG 8 PF TXTR STRL LF DI (GLOVE) ×1 IMPLANT
GLOVE SURG ENC MOIS LTX SZ7.5 (GLOVE) ×2 IMPLANT
GLOVE SURG POLYISO LF SZ7.5 (GLOVE) ×2 IMPLANT
GLOVE SURG UNDER POLY LF SZ7.5 (GLOVE) ×2 IMPLANT
GLOVE SURG UNDER POLY LF SZ8 (GLOVE) ×2
GOWN STRL REUS W/ TWL LRG LVL3 (GOWN DISPOSABLE) ×1 IMPLANT
GOWN STRL REUS W/TWL LRG LVL3 (GOWN DISPOSABLE) ×2
K-WIRE ORTHOPEDIC 1.4X150L (WIRE) ×2
KIT BASIN OR (CUSTOM PROCEDURE TRAY) ×2 IMPLANT
KIT TURNOVER KIT A (KITS) ×1 IMPLANT
KWIRE ORTHOPEDIC 1.4X150L (WIRE) IMPLANT
MANIFOLD NEPTUNE II (INSTRUMENTS) ×2 IMPLANT
NEEDLE HYPO 22GX1.5 SAFETY (NEEDLE) ×2 IMPLANT
NS IRRIG 1000ML POUR BTL (IV SOLUTION) ×2 IMPLANT
PACK ORTHO EXTREMITY (CUSTOM PROCEDURE TRAY) ×2 IMPLANT
PAD CAST 4YDX4 CTTN HI CHSV (CAST SUPPLIES) ×2 IMPLANT
PADDING CAST COTTON 4X4 STRL (CAST SUPPLIES) ×2
PENCIL SMOKE EVACUATOR (MISCELLANEOUS) ×1 IMPLANT
PLATE FIBULA 5H (Plate) ×1 IMPLANT
PROTECTOR NERVE ULNAR (MISCELLANEOUS) ×2 IMPLANT
SCREW BONE 18X3.5 (Screw) ×1 IMPLANT
SCREW BONE 3.5X14 LOCKING (Screw) ×3 IMPLANT
SCREW BONE FT 3.5X44 (Screw) ×1 IMPLANT
SCREW BONE THRD T10 3.5X12 (Screw) ×3 IMPLANT
SCREW CANNULATED 4.0 (Screw) ×1 IMPLANT
SCREW T10 FT 3.5XL50 (Screw) ×1 IMPLANT
SPIKE FLUID TRANSFER (MISCELLANEOUS) IMPLANT
SUCTION FRAZIER HANDLE 10FR (MISCELLANEOUS) ×2
SUCTION TUBE FRAZIER 10FR DISP (MISCELLANEOUS) ×1 IMPLANT
SUT ETHILON 3 0 PS 1 (SUTURE) ×4 IMPLANT
SUT MNCRL AB 4-0 PS2 18 (SUTURE) IMPLANT
SUT MON AB 2-0 CT1 36 (SUTURE) ×4 IMPLANT
SUT MON AB 3-0 SH 27 (SUTURE)
SUT MON AB 3-0 SH27 (SUTURE) IMPLANT
SUT VIC AB 0 CT1 36 (SUTURE) ×2 IMPLANT
SUT VIC AB 2-0 SH 27 (SUTURE)
SUT VIC AB 2-0 SH 27XBRD (SUTURE) IMPLANT
SYR CONTROL 10ML LL (SYRINGE) ×2 IMPLANT
TOWEL OR 17X26 10 PK STRL BLUE (TOWEL DISPOSABLE) ×2 IMPLANT
TOWEL OR NON WOVEN STRL DISP B (DISPOSABLE) ×2 IMPLANT
UNDERPAD 30X36 HEAVY ABSORB (UNDERPADS AND DIAPERS) ×2 IMPLANT

## 2021-03-19 NOTE — Anesthesia Preprocedure Evaluation (Addendum)
Anesthesia Evaluation  ?Patient identified by MRN, date of birth, ID band ?Patient awake ? ? ? ?Reviewed: ?Allergy & Precautions, NPO status , Patient's Chart, lab work & pertinent test results ? ?Airway ?Mallampati: II ? ?TM Distance: >3 FB ?Neck ROM: Full ? ? ? Dental ? ?(+) Upper Dentures, Lower Dentures, Dental Advisory Given ?  ?Pulmonary ?shortness of breath and with exertion, asthma , pneumonia, resolved,  ?  ?Pulmonary exam normal ?breath sounds clear to auscultation ? ? ? ? ? ? Cardiovascular ?hypertension, Pt. on medications ?+ CAD  ?Normal cardiovascular exam+ dysrhythmias Atrial Fibrillation  ?Rhythm:Regular Rate:Normal ? ?60% LCx 2013 ? ?Hx/o Atrial Flutter S/P DCCV x 2 on Diltiazem ? ?Echo 12/20/2012 ?LVEF 76-19%, diastolic dysfunction, no RMA ? ?Echo 02/02/20 ?Normal LV systolic function with visual EF 50-55%. Left ventricle cavity is normal in size. Normal global wall motion. Unable to evaluate diastolic function due to atrial flutter. Elevated LAP. Calculated EF 62%.  ?Left atrial cavity is mildly dilated.  ?Right atrial cavity is mildly dilated.  ?Mild (Grade I) mitral regurgitation.  ?Mild tricuspid regurgitation.  ?Compared to prior study dated 12/09/2018: Biatrial enlargement is new, LVEF 65% and now 50-555%, otherwise no significant change ? ?Myocardial perfusion scan 12/27/2018 ?Nondiagnostic ECG stress. ?Normal myocardial perfusion. Stress LV EF: 58%.  ?No previous exam available for comparison. Low risk study.  ? ?  ?Neuro/Psych ? Headaches, Poor memory ?Diabetic neuropathy ?negative psych ROS  ? GI/Hepatic ?Neg liver ROS, hiatal hernia, GERD  Medicated and Controlled,Hx/o [employer] Fundoplication ?  ?Endo/Other  ?diabetes, Poorly Controlled, Type 2, Oral Hypoglycemic Agents, Insulin DependentHypothyroidism Hyperlipidemia ? Renal/GU ?negative Renal ROS  ? ?BPH ? ?  ?Musculoskeletal ? ?(+) Arthritis , Osteoarthritis,  Bimalleolar Fx right ankle  ? Abdominal ?   ?Peds ? Hematology ? ?(+) Blood dyscrasia, anemia , Xarelto therapy- last dose 03/13/21   ?Anesthesia Other Findings ? ? Reproductive/Obstetrics ? ?  ? ? ? ? ? ? ? ? ? ? ? ? ? ?  ?  ? ? ? ? ? ? ? ?Anesthesia Physical ?Anesthesia Plan ? ?ASA: 3 ? ?Anesthesia Plan: Spinal  ? ?Post-op Pain Management: Regional block*  ? ?Induction: Intravenous ? ?PONV Risk Score and Plan: 2 and Treatment may vary due to age or medical condition, Propofol infusion and Ondansetron ? ?Airway Management Planned: Natural Airway and Simple Face Mask ? ?Additional Equipment: None ? ?Intra-op Plan:  ? ?Post-operative Plan:  ? ?Informed Consent: I have reviewed the patients History and Physical, chart, labs and discussed the procedure including the risks, benefits and alternatives for the proposed anesthesia with the patient or authorized representative who has indicated his/her understanding and acceptance.  ? ? ? ?Dental advisory given ? ?Plan Discussed with: CRNA and Anesthesiologist ? ?Anesthesia Plan Comments:   ? ? ? ? ? ? ?Anesthesia Quick Evaluation ? ?

## 2021-03-19 NOTE — Consult Note (Signed)
?History and Physical  ? ?Brett Matthews ZES:[id number] DOB: 1932-06-16 DOA: 03/19/2021 ? ?PCP: Glenda Chroman, MD  ? ?Requesting provider: Renette Butters, MD ? ?Reason for consult: Assistance with chronic medical conditions, hypertension ? ?HPI: Brett Matthews is a 88 y.o. male with medical history significant of hypothyroidism, diabetes, hypertension, CAD, GERD, paroxysmal A-fib, hyperlipidemia, anemia, hemorrhoids, diverticulosis presenting for surgical repair of right ankle fracture. ? ?Patient presented to outpatient orthopedics office on March 8 noted to have bimalleolar ankle fracture by x-ray.  Brought in for surgical repair today.  Orthopedic provider noted that patient had significant need on PT evaluation and will be needing skilled nursing. ? ?Patient reports feeling better.  Is significantly hard of hearing and does not have his hearing aids with him.  Denies fevers, chills, chest pain, shortness of breath, abdominal pain, constipation, diarrhea, nausea, vomiting. ? ?Hospital course: Thus far patient has notably had blood pressure in the 263F to 354T systolic.  Different required some oxygen in the PACU.  Has been started on home medications which include atorvastatin, benazepril, carvedilol, diltiazem, Celexa, clonazepam, albuterol, atorvastatin, PPI, Pepcid, omeprazole, insulin, metformin, gabapentin, Synthroid, Xarelto, tamsulosin.  A1c is pending and glucose noted to be 143.  No other significant lab work-up today.  X-ray from March 8 showing bimalleolar ankle fracture with intraoperative imaging today which is still pending. ? ?Review of Systems: As per HPI otherwise all other systems reviewed and are negative. ? ?Past Medical History:  ?Diagnosis Date  ? Anemia, iron deficiency   ? Negative capsule endoscopy  ? Arthritis   ? Coronary atherosclerosis of native coronary artery   ? 60 % circumflex stenosis; EF 65%, Cath 6/13  ? Diverticula of colon   ? Pancolonic  ? DM (diabetes mellitus), type 2,  uncontrolled   ? Dyspnea   ? Normal cardiopulmonary function test in July 2008, negative echocardiogram with "bubble" study for inter-cardiac shunt.  ? Essential hypertension, benign   ? Gastroesophageal reflux disease   ? Hemorrhoids   ? Hyperplastic colon polyp 04/06/2007  ? Hypothyroidism 2003  ? Status post total thyroidectomy  ? Memory difficulties   ? Migraines   ? Neoplasm of lymphatic and hematopoietic tissue   ? Paroxysmal atrial fibrillation (HCC)   ? Diagnosed 2005  ? Skin cancer   ? ? ?Past Surgical History:  ?Procedure Laterality Date  ? BACK SURGERY    ? BONE MARROW BIOPSY  1990's  ? CARDIAC CATHETERIZATION    ? CARDIOVERSION N/A 01/18/2019  ? Procedure: CARDIOVERSION;  Surgeon: Adrian Prows, MD;  Location: [facility];  Service: Cardiovascular;  Laterality: N/A;  ? CARDIOVERSION N/A 03/20/2020  ? Procedure: CARDIOVERSION;  Surgeon: Adrian Prows, MD;  Location: [facility] ENDOSCOPY;  Service: Cardiovascular;  Laterality: N/A;  ? CATARACT EXTRACTION Bilateral   ? COLONOSCOPY  04/06/2007  ? Dr. Gala Romney- Normal rectum with scattered pancolonic diverticula and slightly redundant elongated colon, diminutive polpy midsigmoid, remainder of colonic mucosa appeared normal. bx= hyperplastic polyp  ? COLONOSCOPY N/A 11/29/2012  ? GYB:WLSLHTDS preparation. Friable anal canal/internal hemorrhoids; otherwise, normal rectum. Normal-appearing colonic mucosa  ? COLONOSCOPY WITH PROPOFOL N/A 03/12/2017  ? Procedure: COLONOSCOPY WITH PROPOFOL;  Surgeon: Daneil Dolin, MD;  Location: [facility];  Service: Endoscopy;  Laterality: N/A;  7:30am  ? ESOPHAGOGASTRODUODENOSCOPY  04/06/2007  ? Dr. Gala Romney- normal esophagus s/p nissen fundoplication, intact nissen wrap o/w normal stomach  ? ESOPHAGOGASTRODUODENOSCOPY N/A 11/29/2012  ? RMR: Abnormall distal esophagus bx c/w GERD. prior fundoplication. Gastric polyps  -  bx benign. Status post biopsy of normal--appearing duodenal mucosa (bx neg)  ? HEMORRHOID SURGERY N/A 04/06/2017  ? Procedure:  EXTENSIVE HEMORRHOIDECTOMY;  Surgeon: Aviva Signs, MD;  Location: [facility] ORS;  Service: General;  Laterality: N/A;  ? LEFT HEART CATHETERIZATION WITH CORONARY ANGIOGRAM N/A 06/27/2011  ? Procedure: LEFT HEART CATHETERIZATION WITH CORONARY ANGIOGRAM;  Surgeon: Thayer Headings, MD;  Location: [facility] CATH LAB;  Service: Cardiovascular;  Laterality: N/A;  ? NISSEN FUNDOPLICATION    ? POLYPECTOMY  03/12/2017  ? Procedure: POLYPECTOMY;  Surgeon: Daneil Dolin, MD;  Location: [facility];  Service: Endoscopy;;  cecal x3  ? POSTERIOR LAMINECTOMY / DECOMPRESSION LUMBAR SPINE  ~ 1990's  ? SKIN CANCER EXCISION    ? "off my back and chest; from sun"  ? ? ?Social History ? reports that he has never smoked. He has never used smokeless tobacco. He reports that he does not drink alcohol and does not use drugs. ? ?No Known Allergies ? ?Family History  ?Problem Relation Age of Onset  ? Hypertension Mother   ? Stroke Mother   ? Cancer Father   ? Cancer Sister   ? Stroke Brother   ? Cancer Other   ? Stroke Other   ? Colon cancer Neg Hx   ?Reviewed on consultation ? ?Prior to Admission medications   ?Medication Sig Start Date End Date Taking? Authorizing Provider  ?acetaminophen (TYLENOL) 500 MG tablet Take 1,000 mg by mouth every 8 (eight) hours as needed for moderate pain.   Yes [provider]  ?albuterol (VENTOLIN HFA) 108 (90 Base) MCG/ACT inhaler Inhale 1-2 puffs into the lungs every 6 (six) hours as needed for wheezing or shortness of breath.   Yes [provider]  ?atorvastatin (LIPITOR) 10 MG tablet Take 1 tablet (10 mg total) by mouth daily. 02/02/20  Yes Adrian Prows, MD  ?baclofen (LIORESAL) 10 MG tablet Take 1 tablet (10 mg total) by mouth 3 (three) times daily as needed for muscle spasms. 03/19/21 03/19/22 Yes Gawne, [place], PA-C  ?benazepril (LOTENSIN) 20 MG tablet Take 1 tablet (20 mg total) by mouth daily. 11/02/20  Yes Cantwell, Celeste C, PA-C  ?carvedilol (COREG) 12.5 MG tablet Take 12.5 mg by mouth 2 (two)  times daily with a meal.   Yes [provider]  ?Cholecalciferol (D3-1000) 25 MCG (1000 UT) capsule Take 1,000 Units by mouth every evening.   Yes [provider]  ?citalopram (CELEXA) 40 MG tablet Take 40 mg by mouth daily. 03/23/17  Yes [provider]  ?clonazePAM (KLONOPIN) 1 MG tablet Take 1 mg by mouth at bedtime. For sleep   Yes [provider]  ?diltiazem (CARDIZEM CD) 240 MG 24 hr capsule Take 240 mg by mouth daily.   Yes [provider]  ?docusate sodium (COLACE) 100 MG capsule Take 100 mg by mouth every evening.   Yes [provider]  ?famotidine (PEPCID) 40 MG tablet Take 40 mg by mouth daily. 03/04/20  Yes [provider]  ?fluticasone (FLONASE) 50 MCG/ACT nasal spray Place 1 spray into both nostrils daily as needed for allergies.   Yes [provider]  ?folic acid (FOLVITE) 1 MG tablet Take 1 tablet by mouth daily at 12 noon. 03/12/17  Yes [provider]  ?gabapentin (NEURONTIN) 300 MG capsule Take 300 mg by mouth 3 (three) times daily.   Yes [provider]  ?glimepiride (AMARYL) 2 MG tablet Take 2 mg by mouth 2 (two) times daily. 03/10/17  Yes  [provider]  ?HYDROcodone-acetaminophen (NORCO) 10-325 MG tablet Take 1 tablet by mouth every 6 (six) hours as needed for severe pain. 03/19/21  Yes Britt Bottom, PA-C  ?Insulin Degludec (TRESIBA) 100 UNIT/ML SOLN Inject 18 Units into the skin daily.   Yes [provider]  ?levothyroxine (SYNTHROID) 125 MCG tablet Take 125 mcg by mouth daily at 2 am. SYNTHROID - brand name only   Yes [provider]  ?Melatonin 10 MG TABS Take 10 mg by mouth at bedtime.   Yes [provider]  ?metFORMIN (GLUCOPHAGE) 500 MG tablet Take 500-1,000 mg by mouth See admin instructions. Take 2 tabs (1,$RemoveB'000mg'ZtRiyQsc$ ) by mouth every morning & 1 tab ($Remo'500mg'guoOA$ ) every evening   Yes [provider]  ?ondansetron (ZOFRAN-ODT) 4 MG disintegrating tablet Take 1 tablet (4  mg total) by mouth 2 (two) times daily as needed for nausea or vomiting. 03/19/21  Yes Aggie Moats M, PA-C  ?oxyCODONE-acetaminophen (PERCOCET) 5-325 MG tablet Take 1-2 tablets by mouth every 4 (four) ho

## 2021-03-19 NOTE — Discharge Instructions (Addendum)

## 2021-03-19 NOTE — Anesthesia Procedure Notes (Signed)
Anesthesia Regional Block: Adductor canal block  ? ?Pre-Anesthetic Checklist: , timeout performed,  Correct Patient, Correct Site, Correct Laterality,  Correct Procedure, Correct Position, site marked,  Risks and benefits discussed,  Surgical consent,  Pre-op evaluation,  At surgeon's request and post-op pain management ? ?Laterality: Right ? ?Prep: chloraprep     ?  ?Needles:  ?Injection technique: Single-shot ? ?Needle Type: Echogenic Stimulator Needle   ? ? ?Needle Length: 10cm  ?Needle Gauge: 21  ? ?Needle insertion depth: 6.5 cm ? ? ?Additional Needles: ? ? ?Procedures:,,,, ultrasound used (permanent image in chart),,    ?Narrative:  ?Start time: 03/19/2021 11:05 AM ?End time: 03/19/2021 11:10 AM ?Injection made incrementally with aspirations every 5 mL. ? ?Performed by: Personally  ?Anesthesiologist: Josephine Igo, MD ? ?Additional Notes: ?Timeout performed. Patient sedated. Relevant anatomy ID'd using [place]. Incremental 2-25m injection of LA with frequent aspiration. Patient tolerated procedure well. ? ? ? ? ?Right Adductor Canal Block ?

## 2021-03-19 NOTE — Progress Notes (Signed)
Assisted Dr. Royce Macadamia with right, ultrasound guided, popliteal and adductor canal blocks. Side rails up, monitors on throughout procedure. See vital signs in flow sheet. Tolerated Procedure well. Diaphoresis at 1110 subsided. ? ?

## 2021-03-19 NOTE — Op Note (Signed)
03/19/2021 ? ?2:55 PM ? ?PATIENT:  Brett Matthews   ? ?PRE-OPERATIVE DIAGNOSIS:  RIGHT ANKLE FRACTURE ? ?POST-OPERATIVE DIAGNOSIS:  Same ? ?PROCEDURE:  OPEN REDUCTION INTERNAL FIXATION (ORIF) ANKLE FRACTURE, COLATERAL ANKLE LIGAMENT REPAIR ? ?SURGEON:  Renette Butters, MD ? ?ASSISTANT: Aggie Moats, PA-C, he was present and scrubbed throughout the case, critical for completion in a timely fashion, and for retraction, instrumentation, and closure. ? ? ?ANESTHESIA:   gen  ? ?PREOPERATIVE INDICATIONS:  Brett Matthews is a  88 y.o. male with a diagnosis of RIGHT ANKLE FRACTURE who failed conservative measures and elected for surgical management.   ? ?The risks benefits and alternatives were discussed with the patient preoperatively including but not limited to the risks of infection, bleeding, nerve injury, cardiopulmonary complications, the need for revision surgery, among others, and the patient was willing to proceed. ? ?OPERATIVE IMPLANTS: [employer]ryker locking titanium plate as well as cannulated medial screw. ? ?OPERATIVE FINDINGS: Unstable ankle fracture. Stable syndesmosis post op ? ?BLOOD LOSS: min ? ?COMPLICATIONS: none ? ?TOURNIQUET TIME: Roughly 60 minutes ? ?OPERATIVE PROCEDURE:  Patient was identified in the preoperative holding area and site was marked by me He was transported to the operating theater and placed on the table in supine position taking care to pad all bony prominences. After a preincinduction time out anesthesia was induced. The right lower extremity was prepped and draped in normal sterile fashion and a pre-incision timeout was performed. Brett Matthews received Ancef for preoperative antibiotics.  ? ?I made a lateral incision of roughly 7 cm dissection was carried down sharply to the distal fibula and then spreading dissection was used proximally to protect the superficial peroneal nerve. I sharply incised the periosteum and took care to protect the peroneal tendons. I then debrided the  fracture site and performed a reduction maneuver which was held in place with a clamp.  ? ?I placed a lag screw across the fracture ? ?I then selected a multiple hole locking plate-hole one third tubular plate and placed in a neutralization fashion care was taken distally so as not to penetrate the joint with the cancellus screws.   ? ?Next I placed 4 locking screws distally ? ?I placed 3 bicortical screws proximally followed by 2 syndesmotic screws ? ?This was a stabilization of the syndesmosis which was unstable. ? ?I then turned my attention medially where I created a 4 cm incision and dissected sharply down to the medial Mal fracture taking care to protect the saphenous vein. I debrided the fracture and reduced and held in place with a tenaculum. I then drilled and placed 1 partially threaded 44 mm cannulated screws one anterior and one posterior across the fracture. ? ?The medial fracture was very small and had a large piece of deltoid ligament that was loose I repaired the deltoid ligament back to the medial tibia.  This was a repair of the collateral ligament of the ankle. ? ?The wound was then thoroughly irrigated and closed using a 0 Vicryl and absorbable Monocryl sutures. He was placed in a short leg splint. ?  ?POST OPERATIVE PLAN: Non-weightbearing. DVT prophylaxis will consist of mobilization and chemical px ? ? ? ?

## 2021-03-19 NOTE — Transfer of Care (Signed)
Immediate Anesthesia Transfer of Care Note ? ?Patient: Brett Matthews ? ?Procedure(s) Performed: OPEN REDUCTION INTERNAL FIXATION (ORIF) ANKLE FRACTURE, COLATERAL ANKLE LIGAMENT REPAIR (Right: Ankle) ? ?Patient Location: PACU ? ?Anesthesia Type:Spinal ? ?Level of Consciousness: drowsy ? ?Airway & Oxygen Therapy: Patient Spontanous Breathing and Patient connected to face mask ? ?Post-op Assessment: Report given to RN and Post -op Vital signs reviewed and stable ? ?Post vital signs: Reviewed and stable ? ?Last Vitals:  ?Vitals Value Taken Time  ?BP 125/69 03/19/21 1515  ?Temp    ?Pulse 70 03/19/21 1517  ?Resp 13 03/19/21 1517  ?SpO2 99 % 03/19/21 1517  ?Vitals shown include unvalidated device data. ? ?Last Pain:  ?Vitals:  ? 03/19/21 1125  ?TempSrc:   ?PainSc: 0-No pain  ?   ? ?  ? ?Complications: No notable events documented. ?

## 2021-03-19 NOTE — Anesthesia Procedure Notes (Signed)
Spinal ? ?Patient location during procedure: OR ?Start time: 03/19/2021 1:31 PM ?End time: 03/19/2021 1:35 PM ?Reason for block: surgical anesthesia ?Preanesthetic Checklist ?Completed: patient identified, IV checked, site marked, risks and benefits discussed, surgical consent, monitors and equipment checked, pre-op evaluation and timeout performed ?Spinal Block ?Patient position: sitting ?Prep: DuraPrep and site prepped and draped ?Patient monitoring: heart rate, cardiac monitor, continuous pulse ox and blood pressure ?Approach: midline ?Location: L4-5 ?Injection technique: single-shot ?Needle ?Needle type: Pencan  ?Needle gauge: 24 G ?Needle length: 9 cm ?Needle insertion depth: 6.5 cm ?Assessment ?Sensory level: T4 ?Events: CSF return ?Additional Notes ?Patient tolerated procedure well. Adequate sensory level. ? ? ? ? ? ?

## 2021-03-19 NOTE — Interval H&P Note (Signed)
History and Physical Interval Note: ? ?03/19/2021 ?11:36 AM ? ?Brett Matthews  has presented today for surgery, with the diagnosis of RIGHT ANKLE FRACTURE.  The various methods of treatment have been discussed with the patient and family. After consideration of risks, benefits and other options for treatment, the patient has consented to  Procedure(s): ?OPEN REDUCTION INTERNAL FIXATION (ORIF) ANKLE FRACTURE (Right) as a surgical intervention.  The patient's history has been reviewed, patient examined, no change in status, stable for surgery.  I have reviewed the patient's chart and labs.  Questions were answered to the patient's satisfaction.   ? ? ?Brett Matthews ? ? ?

## 2021-03-19 NOTE — Anesthesia Procedure Notes (Signed)
Anesthesia Regional Block: Popliteal block  ? ?Pre-Anesthetic Checklist: , timeout performed,  Correct Patient, Correct Site, Correct Laterality,  Correct Procedure, Correct Position, site marked,  Risks and benefits discussed,  Surgical consent,  Pre-op evaluation,  At surgeon's request and post-op pain management ? ?Laterality: Right ? ?Prep: chloraprep     ?  ?Needles:  ?Injection technique: Single-shot ? ?Needle Type: Echogenic Stimulator Needle   ? ? ?Needle Length: 10cm  ?Needle Gauge: 21  ? ?Needle insertion depth: 6 cm ? ? ?Additional Needles: ? ? ?Procedures:,,,, ultrasound used (permanent image in chart),,    ?Narrative:  ?Start time: 03/19/2021 11:00 AM ?End time: 03/19/2021 11:05 AM ?Injection made incrementally with aspirations every 5 mL. ? ?Performed by: Personally  ?Anesthesiologist: Josephine Igo, MD ? ?Additional Notes: ?Timeout performed. Patient sedated. Relevant anatomy ID'd using [place]. Incremental 2-93m injection of LA with frequent aspiration. Patient tolerated procedure well. ? ? ? ?Right Popliteal Block ? ?

## 2021-03-19 NOTE — Evaluation (Addendum)
Physical Therapy Evaluation Patient Details Name: Brett Matthews MRN: [id number] DOB: Aug 19, 1932 Today's Date: 03/19/2021  History of Present Illness  Patient is 88 y.o. male s/p ORIF on 03/19/21 for Rt ankle fracture, now NWB. Patient  presented for surgery after fall on 3/8 while trying to get into back seat of car, pt his his head during the fall. PMH significant for OA, anemia, DMII, HTN, GERD, hypothyroidism, memory impairment, PAF.    Clinical Impression  Brett Matthews is a 88 y.o. male POD 0 s/p ORIF for Rt ankle fracture. Patient is currently limited by functional impairments below (see PT problem list). Patient lives alone and has 2 caregivers intermittently but is home alone throughout the daytime. Since his fall on 3/8 he has not been abel to maintain NWB status on Rt LE and has required increased assist of 1-2 persons with mod-max assist to transfer to wheelchair. During evaluation today pt is confused and unaware of his surgery or that he is in the hospital which is not his typical cognitive status. He required 2+ Mod-max assist to stand and stretcher was exchanged with bed for pt to transfer safely as he was unable to take steps safely an could not maintain NWB on Rt LE. Pt is very HIGH FALL RISK and is unsafe to return home. Patient will benefit from continued skilled PT interventions to address impairments and progress independence with mobility, recommending SNF with 24/7 assist. Pt needs social work consult. Acute PT will follow and progress as able.        Recommendations for follow up therapy are one component of a multi-disciplinary discharge planning process, led by the attending physician.  Recommendations may be updated based on patient status, additional functional criteria and insurance authorization.  Follow Up Recommendations Skilled nursing-short term rehab (<3 hours/day)    Assistance Recommended at Discharge Frequent or constant Supervision/Assistance  Patient can  return home with the following  Two people to help with walking and/or transfers;Two people to help with bathing/dressing/bathroom;Assistance with cooking/housework;Assistance with feeding;Direct supervision/assist for medications management;Direct supervision/assist for financial management;Assist for transportation;Help with stairs or ramp for entrance    Equipment Recommendations None recommended by PT  Recommendations for Other Services       Functional Status Assessment Patient has had a recent decline in their functional status and demonstrates the ability to make significant improvements in function in a reasonable and predictable amount of time.     Precautions / Restrictions Precautions Precautions: Fall Precaution Comments: pt has significant fall hx: at least 6 in last month, caregivers report unknown number in last 6 months due to frequency. Restrictions Weight Bearing Restrictions: Yes RLE Weight Bearing: Non weight bearing      Mobility  Bed Mobility Overal bed mobility: Needs Assistance Bed Mobility: Supine to Sit, Sit to Supine     Supine to sit: Mod assist, HOB elevated Sit to supine: +2 for safety/equipment, Mod assist   General bed mobility comments: Pt required mod assist with HOB to pivot LE's off EOB and raise trunk. Mod assist to scoot anterior to edge in preparation for stand. Mod+2 for safety to return to supine and assist to lower trunk and raise Bil LE's onto bed.    Transfers Overall transfer level: Needs assistance Equipment used: Rolling Massimo (2 wheels) Transfers: Sit to/from Stand Sit to Stand: Mod assist, Max assist, +2 physical assistance, +2 safety/equipment, From elevated surface           General transfer comment: pt required +  2 Mod-Max assist to power up from EOB. pt unable to maintain NWB on Rt LE and required Mod+2 assist to maintain standing balacne with RW for stretcher to be swapped with bed for pt to return to sitting.     Ambulation/Gait                  Stairs            Wheelchair Mobility    Modified Rankin (Stroke Patients Only)       Balance Overall balance assessment: History of Falls, Needs assistance Sitting-balance support: Feet supported, Bilateral upper extremity supported Sitting balance-Leahy Scale: Fair     Standing balance support: Reliant on assistive device for balance, Bilateral upper extremity supported Standing balance-Leahy Scale: Poor Standing balance comment: HIGH FALL RISK, heavy reliance on RW and therapist                             Pertinent Vitals/Pain Pain Assessment Breathing: normal Negative Vocalization: none Facial Expression: smiling or inexpressive Body Language: relaxed Consolability: distracted or reassured by voice/touch PAINAD Score: 1    Home Living Family/patient expects to be discharged to:: Private residence Living Arrangements: Spouse/significant other Available Help at Discharge: Friend(s) Type of Home: House Home Access: Ramped entrance       Home Layout: Two level;Able to live on main level with bedroom/bathroom;Full bath on main level Home Equipment: Wheelchair - Oncologist (2 wheels);Rollator (4 wheels) (lift chair)      Prior Function Prior Level of Function : Needs assist             Mobility Comments: prior to fall on 3/8 and ankle fracture pt was ambulating short household distances with intermittent use of AD. pt was falling frequently. pt has caregiver, Fanny Bien, who reports she is his POA and guardian, and some assist from additional caregiver Isabel Caprice. Over last week since fall pt has not maintained NWB on Rt ankle and has needed assistance to stand up and turn to chair sometimes requiring 2 person assist. ADLs Comments: prior to fall pt needed supervision/cues to safely complete ADL's bathing/dressing and was able to transfer into shower with supervision  and cues to use grab bar.     Hand Dominance        Extremity/Trunk Assessment   Upper Extremity Assessment Upper Extremity Assessment: Generalized weakness    Lower Extremity Assessment Lower Extremity Assessment: Generalized weakness;RLE deficits/detail RLE Deficits / Details: NWB on ankle, splint in place RLE: Unable to fully assess due to immobilization    Cervical / Trunk Assessment Cervical / Trunk Assessment: Normal  Communication   Communication: HOH  Cognition Arousal/Alertness: Awake/alert Behavior During Therapy: WFL for tasks assessed/performed Overall Cognitive Status: No family/caregiver present to determine baseline cognitive functioning Area of Impairment: Orientation, Following commands, Safety/judgement, Problem solving, Memory                 Orientation Level: Disoriented to, Place, Situation, Time   Memory: Decreased short-term memory, Decreased recall of precautions Following Commands: Follows one step commands inconsistently, Follows one step commands with increased time Safety/Judgement: Decreased awareness of safety   Problem Solving: Requires verbal cues, Requires tactile cues General Comments: chart review indicated some memory impairments at baseline. Caregiver reports pt typically knows where he is and knew he had broken his ankle. Pt recieved in bed holding call bell stating "I need help to figure out where I am"  pt unable to state he is in hospital or that he has had surgery. limited assessment as caregiver nto present and pt HOH.        General Comments      Exercises     Assessment/Plan    PT Assessment Patient needs continued PT services  PT Problem List Decreased strength;Decreased range of motion;Decreased activity tolerance;Decreased balance;Decreased mobility;Decreased knowledge of use of DME;Decreased knowledge of precautions;Decreased safety awareness;Decreased cognition       PT Treatment Interventions DME  instruction;Gait training;Stair training;Functional mobility training;Therapeutic activities;Therapeutic exercise;Balance training;Patient/family education;Neuromuscular re-education;Cognitive remediation;Wheelchair mobility training    PT Goals (Current goals can be found in the Care Plan section)  Acute Rehab PT Goals Patient Stated Goal: unable to state PT Goal Formulation: Patient unable to participate in goal setting Time For Goal Achievement: 04/02/21 Potential to Achieve Goals: Poor    Frequency Min 3X/week     Co-evaluation               AM-PAC PT "6 Clicks" Mobility  Outcome Measure Help needed turning from your back to your side while in a flat bed without using bedrails?: A Lot Help needed moving from lying on your back to sitting on the side of a flat bed without using bedrails?: A Lot Help needed moving to and from a bed to a chair (including a wheelchair)?: Total Help needed standing up from a chair using your arms (e.g., wheelchair or bedside chair)?: Total Help needed to walk in hospital room?: Total Help needed climbing 3-5 steps with a railing? : Total 6 Click Score: 8    End of Session Equipment Utilized During Treatment: Gait belt Activity Tolerance: Patient tolerated treatment well Patient left: in bed;with call bell/phone within reach;with nursing/sitter in room Nurse Communication: Mobility status PT Visit Diagnosis: Muscle weakness (generalized) (M62.81);Difficulty in walking, not elsewhere classified (R26.2);Other abnormalities of gait and mobility (R26.89)    Time: 1821-1901 (some time spent speakeing with caregiver on phone) PT Time Calculation (min) (ACUTE ONLY): 40 min   Charges:   PT Evaluation $PT Eval Moderate Complexity: 1 Mod PT Treatments $Therapeutic Activity: 8-22 mins        Wynn Maudlin, DPT Acute Rehabilitation Services Office [phone] Pager [phone]   Anitra Lauth 03/19/2021, 7:37 PM

## 2021-03-20 DIAGNOSIS — Z20822 Contact with and (suspected) exposure to covid-19: Secondary | ICD-10-CM | POA: Diagnosis present

## 2021-03-20 DIAGNOSIS — R410 Disorientation, unspecified: Secondary | ICD-10-CM | POA: Insufficient documentation

## 2021-03-20 DIAGNOSIS — R451 Restlessness and agitation: Secondary | ICD-10-CM | POA: Diagnosis not present

## 2021-03-20 DIAGNOSIS — Z79899 Other long term (current) drug therapy: Secondary | ICD-10-CM | POA: Diagnosis not present

## 2021-03-20 DIAGNOSIS — I10 Essential (primary) hypertension: Secondary | ICD-10-CM | POA: Diagnosis present

## 2021-03-20 DIAGNOSIS — E785 Hyperlipidemia, unspecified: Secondary | ICD-10-CM | POA: Diagnosis present

## 2021-03-20 DIAGNOSIS — R4189 Other symptoms and signs involving cognitive functions and awareness: Secondary | ICD-10-CM | POA: Diagnosis present

## 2021-03-20 DIAGNOSIS — D649 Anemia, unspecified: Secondary | ICD-10-CM | POA: Diagnosis present

## 2021-03-20 DIAGNOSIS — H9193 Unspecified hearing loss, bilateral: Secondary | ICD-10-CM

## 2021-03-20 DIAGNOSIS — F39 Unspecified mood [affective] disorder: Secondary | ICD-10-CM | POA: Diagnosis present

## 2021-03-20 DIAGNOSIS — M1990 Unspecified osteoarthritis, unspecified site: Secondary | ICD-10-CM | POA: Diagnosis present

## 2021-03-20 DIAGNOSIS — I480 Paroxysmal atrial fibrillation: Secondary | ICD-10-CM | POA: Diagnosis present

## 2021-03-20 DIAGNOSIS — N400 Enlarged prostate without lower urinary tract symptoms: Secondary | ICD-10-CM | POA: Diagnosis not present

## 2021-03-20 DIAGNOSIS — H9190 Unspecified hearing loss, unspecified ear: Secondary | ICD-10-CM | POA: Diagnosis present

## 2021-03-20 DIAGNOSIS — I2510 Atherosclerotic heart disease of native coronary artery without angina pectoris: Secondary | ICD-10-CM | POA: Diagnosis present

## 2021-03-20 DIAGNOSIS — S82841A Displaced bimalleolar fracture of right lower leg, initial encounter for closed fracture: Secondary | ICD-10-CM | POA: Diagnosis present

## 2021-03-20 DIAGNOSIS — W1830XA Fall on same level, unspecified, initial encounter: Secondary | ICD-10-CM | POA: Diagnosis present

## 2021-03-20 DIAGNOSIS — E876 Hypokalemia: Secondary | ICD-10-CM | POA: Diagnosis not present

## 2021-03-20 DIAGNOSIS — E119 Type 2 diabetes mellitus without complications: Secondary | ICD-10-CM | POA: Diagnosis not present

## 2021-03-20 DIAGNOSIS — Z7984 Long term (current) use of oral hypoglycemic drugs: Secondary | ICD-10-CM | POA: Diagnosis not present

## 2021-03-20 DIAGNOSIS — Z7901 Long term (current) use of anticoagulants: Secondary | ICD-10-CM | POA: Diagnosis not present

## 2021-03-20 DIAGNOSIS — Z85828 Personal history of other malignant neoplasm of skin: Secondary | ICD-10-CM | POA: Diagnosis not present

## 2021-03-20 DIAGNOSIS — K219 Gastro-esophageal reflux disease without esophagitis: Secondary | ICD-10-CM | POA: Diagnosis present

## 2021-03-20 DIAGNOSIS — E890 Postprocedural hypothyroidism: Secondary | ICD-10-CM | POA: Diagnosis present

## 2021-03-20 DIAGNOSIS — Z79890 Hormone replacement therapy (postmenopausal): Secondary | ICD-10-CM | POA: Diagnosis not present

## 2021-03-20 DIAGNOSIS — Z794 Long term (current) use of insulin: Secondary | ICD-10-CM | POA: Diagnosis not present

## 2021-03-20 DIAGNOSIS — S82891A Other fracture of right lower leg, initial encounter for closed fracture: Secondary | ICD-10-CM | POA: Diagnosis not present

## 2021-03-20 LAB — CBC
HCT: 31.5 % — ABNORMAL LOW (ref 39.0–52.0)
Hemoglobin: 10 g/dL — ABNORMAL LOW (ref 13.0–17.0)
MCH: 26.9 pg (ref 26.0–34.0)
MCHC: 31.7 g/dL (ref 30.0–36.0)
MCV: 84.7 fL (ref 80.0–100.0)
Platelets: 311 10*3/uL (ref 150–400)
RBC: 3.72 MIL/uL — ABNORMAL LOW (ref 4.22–5.81)
RDW: 13.7 % (ref 11.5–15.5)
WBC: 9.3 10*3/uL (ref 4.0–10.5)
nRBC: 0 % (ref 0.0–0.2)

## 2021-03-20 LAB — COMPREHENSIVE METABOLIC PANEL
ALT: 12 U/L (ref 0–44)
AST: 13 U/L — ABNORMAL LOW (ref 15–41)
Albumin: 3.1 g/dL — ABNORMAL LOW (ref 3.5–5.0)
Alkaline Phosphatase: 93 U/L (ref 38–126)
Anion gap: 9 (ref 5–15)
BUN: 18 mg/dL (ref 8–23)
CO2: 26 mmol/L (ref 22–32)
Calcium: 9.7 mg/dL (ref 8.9–10.3)
Chloride: 102 mmol/L (ref 98–111)
Creatinine, Ser: 0.8 mg/dL (ref 0.61–1.24)
GFR, Estimated: >60 mL/min (ref >60)
Glucose, Bld: 221 mg/dL — ABNORMAL HIGH (ref 70–99)
Potassium: 3.9 mmol/L (ref 3.5–5.1)
Sodium: 137 mmol/L (ref 135–145)
Total Bilirubin: 0.4 mg/dL (ref 0.3–1.2)
Total Protein: 6 g/dL — ABNORMAL LOW (ref 6.5–8.1)

## 2021-03-20 LAB — HEMOGLOBIN A1C
Hgb A1c MFr Bld: 6.1 % — ABNORMAL HIGH (ref 4.8–5.6)
Mean Plasma Glucose: 128 mg/dL

## 2021-03-20 LAB — GLUCOSE, CAPILLARY
Glucose-Capillary: 181 mg/dL — ABNORMAL HIGH (ref 70–99)
Glucose-Capillary: 87 mg/dL (ref 70–99)
Glucose-Capillary: 111 mg/dL — ABNORMAL HIGH (ref 70–99)

## 2021-03-20 MED ORDER — LORAZEPAM 2 MG/ML IJ SOLN
0.2500 mg | Freq: Once | INTRAMUSCULAR | Status: AC
Start: 2021-03-21 — End: 2021-03-20
  Administered 2021-03-20: 0.25 mg via INTRAVENOUS
  Filled 2021-03-20: qty 1

## 2021-03-20 MED ORDER — BENAZEPRIL HCL 10 MG PO TABS
40.0000 mg | ORAL_TABLET | Freq: Every day | ORAL | Status: DC
  Administered 2021-03-20 – 2021-03-22 (×3): 40 mg via ORAL
  Filled 2021-03-20 (×3): qty 4

## 2021-03-20 MED ORDER — INSULIN GLARGINE-YFGN 100 UNIT/ML ~~LOC~~ SOLN
10.0000 [IU] | Freq: Two times a day (BID) | SUBCUTANEOUS | Status: DC
  Administered 2021-03-20 – 2021-03-22 (×5): 10 [IU] via SUBCUTANEOUS
  Filled 2021-03-20 (×7): qty 0.1

## 2021-03-20 MED ORDER — CITALOPRAM HYDROBROMIDE 20 MG PO TABS
20.0000 mg | ORAL_TABLET | Freq: Every day | ORAL | Status: DC
Start: 2021-03-20 — End: 2021-03-23
  Administered 2021-03-20 – 2021-03-22 (×3): 20 mg via ORAL
  Filled 2021-03-20 (×3): qty 1

## 2021-03-20 MED ORDER — LABETALOL HCL 5 MG/ML IV SOLN
10.0000 mg | INTRAVENOUS | Status: DC | PRN
  Administered 2021-03-22: 10 mg via INTRAVENOUS
  Filled 2021-03-20 (×3): qty 4

## 2021-03-20 MED ORDER — LABETALOL HCL 5 MG/ML IV SOLN: 10.0000 mg | INTRAVENOUS | Status: DC | PRN

## 2021-03-20 NOTE — Assessment & Plan Note (Addendum)
Unfortunately, his hearing aid is not working ?

## 2021-03-20 NOTE — Assessment & Plan Note (Signed)
-  Continue home Lipitor 

## 2021-03-20 NOTE — Assessment & Plan Note (Addendum)
-  S/p ORIF on 03/19/2021. ?-Management per primary team ?-Adjusted pain medications given confusion and delirium ?-Bowel regimen to avoid constipation ?-Already on Xarelto ?-Therapy recommended SNF. ?

## 2021-03-20 NOTE — TOC Initial Note (Addendum)
Transition of Care (TOC) - Initial/Assessment Note  ? ? ?Patient Details  ?Name: Brett Matthews ?MRN: [id number] ?Date of Birth: 23-Feb-1932 ? ?Transition of Care (TOC) CM/SW Contact:    ?Brett Matthews, Brett Skiff, RN ?Phone Number: ?03/20/2021, 2:48 PM ? ?Clinical Narrative:                 ?Pt from home with significant other Brett Matthews. Spoke with Brett Matthews via phone as pt was a bit confused when I met with him. Physical therapy recommendations gone over with Brett Matthews. Permission received to fax out FL2. Auth to be started for SNF and PTAR with [facility]. TOC will continue to follow. ? ?Expected Discharge Plan: [place] ?Barriers to Discharge: Continued Medical Work up ? ? ?Expected Discharge Plan and Services ?Expected Discharge Plan: [place] ?  ?Discharge Planning Services: CM Consult ?  ?Living arrangements for the past 2 months: [place] ?Expected Discharge Date: 03/19/21               ?  ?  ?Prior Living Arrangements/Services ?Living arrangements for the past 2 months: [place] ?Lives with:: Domestic Partner ?Patient language and need for interpreter reviewed:: Yes ?       ?Need for Family Participation in Patient Care: Yes (Comment) ?Care giver support system in place?: Yes (comment) ?  ?Criminal Activity/Legal Involvement Pertinent to Current Situation/Hospitalization: No - Comment as needed ? ?Activities of Daily Living ?Home Assistive Devices/Equipment: Brett Matthews (specify type), Hearing aid, CBG Meter, Cane (specify quad or straight) (Straight cane, folding cane, Brett Matthews with seat and no seat) ?ADL Screening (condition at time of admission) ?Patient's cognitive ability adequate to safely complete daily activities?: Yes ?Is the patient deaf or have difficulty hearing?: Yes (Wears hearing aids) ?Does the patient have difficulty seeing, even when wearing glasses/contacts?: No ?Does the patient have difficulty concentrating, remembering, or making decisions?: Yes (Memory is in and out per  caregiver) ?Patient able to express need for assistance with ADLs?: Yes ?Does the patient have difficulty dressing or bathing?: No ?Independently performs ADLs?: Yes (appropriate for developmental age) ?Does the patient have difficulty walking or climbing stairs?: Yes (due to ankle fracture and knee pain) ?Weakness of Legs: Both ?Weakness of Arms/Hands: None ?  ? ?Emotional Assessment ?Appearance:: Appears stated age ?Attitude/Demeanor/Rapport: Charismatic ?Affect (typically observed): Calm ?Orientation: : Oriented to Self, Oriented to Place, Oriented to Situation ?Alcohol / Substance Use: Not Applicable ?Psych Involvement: No (comment) ? ?Admission diagnosis:  Closed right ankle fracture [S82.891A] ?Patient Active Problem List  ? Diagnosis Date Noted  ? Confusion 03/20/2021  ? Hard of hearing 03/20/2021  ? BPH (benign prostatic hyperplasia) 03/20/2021  ? Mood disorder ([place]) 03/20/2021  ? Closed right ankle fracture 03/19/2021  ? Persistent atrial fibrillation ([place])   ? Acute respiratory failure with hypoxia ([place]) 08/19/2019  ? Pneumonia due to COVID-19 virus 08/18/2019  ? Chest pain of uncertain etiology 40/97/3532  ? H/O hemorrhoidectomy   ? Bleeding hemorrhoids 03/24/2017  ? Abdominal pain 03/02/2017  ? Anemia 03/02/2017  ? Rectal bleeding 03/17/2013  ? Dyslipidemia 07/24/2010  ? CAD, NATIVE VESSEL 09/26/2008  ? Hypothyroidism 08/06/2007  ? Type 2 diabetes mellitus without complication, without long-term current use of insulin ([place]) 08/06/2007  ? Essential hypertension, benign 08/06/2007  ? GASTROESOPHAGEAL REFLUX DISEASE, CHRONIC 08/06/2007  ? Paroxysmal atrial fibrillation ([place]) 08/06/2007  ? ?PCP:  Brett Chroman, MD ?Pharmacy:   ?Brett [facility], Brett Matthews ?[address] ?  [place] [address] ?Phone: [phone] Fax: [phone] ? ? ? ? ?Social Determinants of Health (SDOH) Interventions ?  ? ?Readmission Risk Interventions ?No flowsheet data found. ? ? ?

## 2021-03-20 NOTE — Assessment & Plan Note (Addendum)
Continue home Flomax ?Monitor urine output ?

## 2021-03-20 NOTE — Assessment & Plan Note (Addendum)
Controlled with hyperglycemia.  A1c 6.1%. ?Recent Labs  ?Lab 03/21/21 ?0904 03/21/21 ?1156 03/21/21 ?1556 03/21/21 ?2028 03/22/21 ?0745  ?GLUCAP 82 218* 204* 146* 115*  ?-Continue basal insulin at 10 units twice daily ?-Continue SSI-moderate ?-Hold home metformin and glimepiride while in-house. ?-Further adjustment as appropriate ? ?

## 2021-03-20 NOTE — Progress Notes (Signed)
? ? ?Subjective: ?Patient is a horrible historian and can add little to nothing to his exam. VERY HOH. I was basically screaming, and he either couldn't hear me or is just delirious. He wouldn't answer me or his answers had nothing to do with what I asked. He doesn't even know where he is or why he is there, who did his surgery, what the date is. Unsure of baseline cognition issues vs anesthesia/narcotics causing his current mental state. Could not understand who I was. Kept going off topic and getting upset anytime someone walked by in the hall. Telling some kind of nonsensical rambling story.  ? ?PT session in PACU last night did not go well. Hopefully continued visits will help improve his ability to mobilize and transfer while maintaining WB status but I'm very cautious about his ability to progress. ? ?Objective:  ? ?VITALS:   ?Vitals:  ? 03/20/21 0013 03/20/21 0639 03/20/21 0915 03/20/21 1215  ?BP: (!) 161/92 (!) 148/91 (!) 151/82 (!) 159/88  ?Pulse: 93 80 80 72  ?Resp: '18 18  16  '$ ?Temp: 98.3 ?F (36.8 ?C) 98.7 ?F (37.1 ?C) 98.1 ?F (36.7 ?C) 98.2 ?F (36.8 ?C)  ?TempSrc: Oral Oral Oral Oral  ?SpO2: 96% 93% 95% 96%  ?Weight:      ?Height:      ? ?CBC Latest Ref Rng & Units 03/20/2021 03/14/2021 01/22/2021  ?WBC 4.0 - 10.5 K/uL 9.3 10.2 11.5(H)  ?Hemoglobin 13.0 - 17.0 g/dL 10.0(L) 11.0(L) 12.7(L)  ?Hematocrit 39.0 - 52.0 % 31.5(L) 35.9(L) 39.9  ?Platelets 150 - 400 K/uL 311 273 309  ? ?BMP Latest Ref Rng & Units 03/20/2021 03/14/2021 01/22/2021  ?Glucose 70 - 99 mg/dL 221(H) 179(H) 149(H)  ?BUN 8 - 23 mg/dL '18 13 12  '$ ?Creatinine 0.61 - 1.24 mg/dL 0.80 0.87 0.72  ?BUN/Creat Ratio 10 - 24 - - -  ?Sodium 135 - 145 mmol/L 137 139 139  ?Potassium 3.5 - 5.1 mmol/L 3.9 3.7 3.8  ?Chloride 98 - 111 mmol/L 102 103 102  ?CO2 22 - 32 mmol/L '26 27 27  '$ ?Calcium 8.9 - 10.3 mg/dL 9.7 9.6 9.7  ? ?Intake/Output   ?   03/14 0701 ?03/15 0700 03/15 0701 ?03/16 0700  ? P.O. 520 360  ? I.V. (mL/kg) 1000 (11.6)   ? IV Piggyback 100   ? Total  Intake(mL/kg) 1620 (18.8) 360 (4.2)  ? Blood 10   ? Total Output 10   ? Net +1610 +360  ?     ? Urine Occurrence 1 x   ?  ? ? ?Physical Exam: ?General: NAD. Very confused. Nonsensical rambling. Unaware of his surgery ?Resp: No increased wob ?Cardio: regular rate and rhythm ?ABD soft ?Neurologically intact ?MSK ?Neurovascularly intact ?Sensation intact distally ?Intact pulses distally ?Could barely get him to wiggle his toes ?Incision: dressing C/D/I ? ? ?Assessment: ?1 Day Post-Op  ?S/P Procedure(s) (LRB): ?OPEN REDUCTION INTERNAL FIXATION (ORIF) ANKLE FRACTURE, COLATERAL ANKLE LIGAMENT REPAIR (Right) ?by Dr. Ernesta Amble. Murphy on 03/19/21 ? ?Principal Problem: ?  Closed right ankle fracture ?Active Problems: ?  Hypothyroidism ?  Type 2 diabetes mellitus without complication, without long-term current use of insulin ([place]) ?  Essential hypertension, benign ?  CAD, NATIVE VESSEL ?  GASTROESOPHAGEAL REFLUX DISEASE, CHRONIC ?  Paroxysmal atrial fibrillation ([facility]) ?  Dyslipidemia ?  Confusion ?  Hard of hearing ?  BPH (benign prostatic hyperplasia) ?  Mood disorder ([place]) ? ? ? ?Plan: ?Likely has some undiagnosed/untreated dementia and may require  the initiation of some medicine to help with the confusion and memory issues  ? ?Family concerned with ability to manage patient at home ?He is not following his NWB status and keeps trying to put weight on his leg ?Advance diet ?Up with therapy as able ?Incentive Spirometry ?Elevate and Apply ice ? ?Weightbearing: NWB RLE ?Insicional and dressing care: Dressings left intact until follow-up and Reinforce dressings as needed ?Orthopedic device(s): Splint ?Showering: Keep dressing dry ?VTE prophylaxis: Xarelto '15mg'$   daily for A-fib, ok to use for DVT prevention as well, can restart today , SCDs, ambulation ?Pain control: limit narcotics  ?Follow - up plan: 2 weeks ?Contact information:  Edmonia Lynch MD, Aggie Moats PA-C ? ?Dispo: He will absolutely need to go to a Skilled Nursing  Facility/Rehab once seen by TOC and bed placement found. His mental status is concerning. ? ? ? ? ?Britt Bottom, PA-C ?Office [phone] ?03/20/2021, 12:43 PM  ?

## 2021-03-20 NOTE — Assessment & Plan Note (Signed)
- 

## 2021-03-20 NOTE — Progress Notes (Signed)
Staff attempted to orient patient to room upon arrival from PACU. Patient repeatedly asking for staff to call his "friend". Staff called the phone number provided but no answer. Patient attempted to get out of bed multiple times, bed alarm on. Patient refusing to allow staff to place tele monitor on him, on call provider aware. Staff on unit will rotate sitting with patient for safety. Patient denies pain and denies being hungry.  ?

## 2021-03-20 NOTE — Evaluation (Signed)
Occupational Therapy Evaluation ?Patient Details ?Name: Brett Matthews ?MRN: [id number] ?DOB: 05-06-1932 ?Today's Date: 03/20/2021 ? ? ?History of Present Illness Patient is 88 y.o. male s/p ORIF on 03/19/21 for Rt ankle fracture, now NWB. Patient  presented for surgery after fall on 3/8 while trying to get into back seat of car, pt his his head during the fall. PMH significant for OA, anemia, DMII, HTN, GERD, hypothyroidism, memory impairment, PAF.  ? ?Clinical Impression ?  ?Brett Matthews is an 88  year old man who presents with NWB status due to ankle fracture, generalized weakness, decreased activity tolerance, impaired balance and hx of memory impairment. Typically he lives alone but has two caregivers. He requires supervision for ADLs but is alone at times. On evaluation he was able to transfer to side of the bed with use of bed rails but unable to power up and maintain weight bearing status to come in to standing. Patient VERY H[place] and combined with his memory deficits made following directions difficulty. He had some understanding of his injury, knew he was at [facility] and that he wasn't supposed to put weight through his ankle. ADLs limited to edge of bed or at bed level. Patient can assist wit UB ADLs but required max-total assist for LB ADLs. Patient will benefit from skilled OT services while in hospital to improve deficits and learn compensatory strategies as needed in order to return to PLOF.  Patient will need short term rehab at discharge.  ?   ? ?Recommendations for follow up therapy are one component of a multi-disciplinary discharge planning process, led by the attending physician.  Recommendations may be updated based on patient status, additional functional criteria and insurance authorization.  ? ?Follow Up Recommendations ? Skilled nursing-short term rehab (<3 hours/day)  ?  ?Assistance Recommended at Discharge Frequent or constant Supervision/Assistance  ?Patient can return home with the  following A lot of help with bathing/dressing/bathroom;Assistance with cooking/housework;Direct supervision/assist for medications management;Two people to help with walking and/or transfers;Help with stairs or ramp for entrance;Direct supervision/assist for financial management ? ?  ?Functional Status Assessment ? Patient has had a recent decline in their functional status and demonstrates the ability to make significant improvements in function in a reasonable and predictable amount of time.  ?Equipment Recommendations ?  (Defer to next venue)  ?  ?Recommendations for Other Services   ? ? ?  ?Precautions / Restrictions Precautions ?Precautions: Fall ?Precaution Comments: pt has significant fall hx: at least 6 in last month, caregivers report unknown number in last 6 months due to frequency. ?Restrictions ?Weight Bearing Restrictions: Yes ?RLE Weight Bearing: Non weight bearing  ? ?  ? ?Mobility Bed Mobility ?  ?  ?  ?  ?  ?  ?  ?  ?  ? ?Transfers ?  ?  ?  ?  ?  ?  ?  ?  ?  ?  ?  ? ?  ?Balance Overall balance assessment: Needs assistance ?Sitting-balance support: No upper extremity supported, Feet supported ?Sitting balance-Leahy Scale: Good ?  ?  ?  ?  ?  ?  ?  ?  ?  ?  ?  ?  ?  ?  ?  ?  ?   ? ?ADL either performed or assessed with clinical judgement  ? ?ADL Overall ADL's : Needs assistance/impaired ?Eating/Feeding: Set up;Sitting ?  ?Grooming: Set up;Sitting ?  ?Upper Body Bathing: Set up;Sitting ?  ?Lower Body Bathing: Maximal assistance;Sitting/lateral leans ?  ?  Upper Body Dressing : Set up;Sitting ?  ?Lower Body Dressing: Maximal assistance;Bed level ?  ?  ?Toilet Transfer Details (indicate cue type and reason): unable ?Toileting- Clothing Manipulation and Hygiene: Total assistance;Bed level ?Toileting - Clothing Manipulation Details (indicate cue type and reason): incontinent at bed level ?  ?  ?  ?General ADL Comments: Patient able to transfer to side of the bed with use of bed rails. Attempted to stand x 2  - patient unable to power up or maintain weight bearing status.  ? ? ? ?Vision Patient Visual Report: No change from baseline ?   ?   ?Perception   ?  ?Praxis   ?  ? ?Pertinent Vitals/Pain Pain Assessment ?Pain Assessment: No/denies pain  ? ? ? ?Hand Dominance   ?  ?Extremity/Trunk Assessment Upper Extremity Assessment ?Upper Extremity Assessment: RUE deficits/detail;LUE deficits/detail ?RUE Deficits / Details: impaired shoulder ROM, 3-/5 and grossly 4/5 strength ?RUE Sensation: WNL ?RUE Coordination: WNL ?LUE Deficits / Details: WFL ROM, grossly 4/5 strength ?LUE Sensation: WNL ?LUE Coordination: WNL ?  ?Lower Extremity Assessment ?Lower Extremity Assessment: Defer to PT evaluation ?  ?Cervical / Trunk Assessment ?Cervical / Trunk Assessment: Normal ?  ?Communication   ?  ?Cognition Arousal/Alertness: Awake/alert ?Behavior During Therapy: [facility] for tasks assessed/performed ?Overall Cognitive Status: No family/caregiver present to determine baseline cognitive functioning ?Area of Impairment: Orientation, Following commands, Safety/judgement, Problem solving, Memory ?  ?  ?  ?  ?  ?  ?  ?  ?Orientation Level: Disoriented to, Place, Situation, Time ?  ?Memory: Decreased short-term memory, Decreased recall of precautions ?Following Commands: Follows one step commands inconsistently, Follows one step commands with increased time ?Safety/Judgement: Decreased awareness of safety ?  ?Problem Solving: Requires verbal cues, Requires tactile cues ?General Comments: chart review indicated some memory impairments at baseline. Caregiver reports pt typically knows where he is and knew he had broken his ankle. Pt recieved in bed holding call bell stating "I need help to figure out where I am" pt unable to state he is in hospital or that he has had surgery. Patient very [place] which also limits ability assess cognition and respond to cues ?  ?  ?General Comments    ? ?  ?Exercises   ?  ?Shoulder Instructions    ? ? ?Home Living   ?  ?   ?  ?  ?  ?  ?  ?  ?  ?  ?  ?  ?  ?  ?  ?  ?  ?  ? ?  ?Prior Functioning/Environment   ?  ?  ?  ?  ?  ?  ?  ?  ?  ? ?  ?  ?OT Problem List: Impaired balance (sitting and/or standing);Decreased cognition;Decreased safety awareness;Decreased knowledge of use of DME or AE;Pain ?  ?   ?OT Treatment/Interventions: Self-care/ADL training;DME and/or AE instruction;Therapeutic activities;Balance training;Patient/family education  ?  ?OT Goals(Current goals can be found in the care plan section) Acute Rehab OT Goals ?OT Goal Formulation: Patient unable to participate in goal setting ?Time For Goal Achievement: 04/03/21 ?Potential to Achieve Goals: Fair  ?OT Frequency: Min 2X/week ?  ? ?Co-evaluation   ?  ?  ?  ?  ? ?  ?AM-PAC OT "6 Clicks" Daily Activity     ?Outcome Measure Help from another person eating meals?: A Little ?Help from another person taking care of personal grooming?: A Little ?Help from another person toileting, which includes using toliet,  bedpan, or urinal?: Total ?Help from another person bathing (including washing, rinsing, drying)?: A Lot ?Help from another person to put on and taking off regular upper body clothing?: A Little ?Help from another person to put on and taking off regular lower body clothing?: A Lot ?6 Click Score: 14 ?  ?End of Session Equipment Utilized During Treatment: Gait belt;Rolling [place] (2 wheels) ?Nurse Communication: Mobility status ? ?Activity Tolerance: Patient tolerated treatment well ?Patient left: in bed;with call bell/phone within reach;with bed alarm set ? ?OT Visit Diagnosis: Other abnormalities of gait and mobility (R26.89);Other symptoms and signs involving cognitive function  ?              ?Time: 9390-3009 ?OT Time Calculation (min): 30 min ?Charges:  OT General Charges ?$OT Visit: 1 Visit ?OT Evaluation ?$OT Eval Low Complexity: 1 Low ?OT Treatments ?$Therapeutic Activity: 8-22 mins ? ?Eusevio Schriver, OTR/L ?Acute Care Rehab Services  ?Office [phone] ?Pager:  [phone]  ? ?Jermond Burkemper L Kimarie Coor ?03/20/2021, 2:44 PM ?

## 2021-03-20 NOTE — Assessment & Plan Note (Addendum)
Multifactorial including hospital delirium, polypharmacy, hard of hearing and underlying cognitive impairment.  Seems to have intermittent confusion at baseline per family.  Was agitated overnight requiring IV Ativan.  He is calm this morning but only oriented to self. ?-Reorientation, delirium and fall precautions. ?-Minimize sedating medications-adjusted pain medications ?-Increase Seroquel to 50 mg at night ?

## 2021-03-20 NOTE — Anesthesia Postprocedure Evaluation (Signed)
Anesthesia Post Note ? ?Patient: Brett Matthews ? ?Procedure(s) Performed: OPEN REDUCTION INTERNAL FIXATION (ORIF) ANKLE FRACTURE, COLATERAL ANKLE LIGAMENT REPAIR (Right: Ankle) ? ?  ? ?Patient location during evaluation: Other ?Anesthesia Type: Spinal and Regional ?Level of consciousness: awake and confused ?Pain management: pain level controlled ?Vital Signs Assessment: post-procedure vital signs reviewed and stable ?Respiratory status: spontaneous breathing and respiratory function stable ?Cardiovascular status: blood pressure returned to baseline and stable ?Postop Assessment: no headache, no backache, no apparent nausea or vomiting, spinal receding and patient able to bend at knees ?Anesthetic complications: no ? ? ?No notable events documented. ? ?Last Vitals:  ?Vitals:  ? 03/20/21 1215 03/20/21 2118  ?BP: (!) 159/88 (!) 143/75  ?Pulse: 72 67  ?Resp: 16 18  ?Temp: 36.8 ?C 36.8 ?C  ?SpO2: 96% 96%  ?  ?Last Pain:  ?Vitals:  ? 03/20/21 2135  ?TempSrc:   ?PainSc: 5   ? ? ?  ?  ?  ?  ?  ?  ? ?[name],W. EDMOND ? ? ? ? ?

## 2021-03-20 NOTE — Assessment & Plan Note (Signed)
-   Continue home Synthroid °

## 2021-03-20 NOTE — Assessment & Plan Note (Addendum)
BP remains elevated but improved.  BP unreliable at times due to agitation ?-Increased home benazepril to 40 mg daily on 3/15 ?-Continue home Coreg and Cardizem CD ?-P.o. hydralazine 25 mg as needed with parameters ?-Pain control ?

## 2021-03-20 NOTE — Hospital Course (Addendum)
88 year old M with PMH of CAD, paroxysmal A-fib, DM-2, HTN, GERD, diminished hearing, hypothyroidism, anemia, hemorrhoids and diverticulosis admitted by orthopedic surgery for right ankle fracture.  Underwent ORIF on 03/19/2021.  Hospital service consulted for assistance with chronic medical condition including uncontrolled hypertension and diabetes. ?Patient seems to have hospital delirium worse at night ?

## 2021-03-20 NOTE — Assessment & Plan Note (Signed)
-   Continue home PPI °

## 2021-03-20 NOTE — Progress Notes (Signed)
?PROGRESS NOTE ? ?Brett Matthews XKG:[id number] DOB: 08/23/1932  ? ?PCP: Glenda Chroman, MD ? ?Patient is from: Home ? ?DOA: 03/19/2021 LOS: 0 ? ?Chief complaints ?No chief complaint on file. ?  ? ?Brief Narrative / Interim history: ?88 year old M with PMH of CAD, paroxysmal A-fib, DM-2, HTN, GERD, diminished hearing, hypothyroidism, anemia, hemorrhoids and diverticulosis admitted by orthopedic surgery for right ankle fracture.  Underwent ORIF on 03/19/2021.  Hospital service consulted for assistance with chronic medical condition including uncontrolled hypertension and diabetes.  ? ?Subjective: ?Seen and examined earlier this morning.  No major events overnight of this morning.  He is awake and alert but remains confused.  Only oriented to self and place.  He denies pain, shortness of breath, nausea, vomiting abdominal pain. ? ?Objective: ?Vitals:  ? 03/20/21 0013 03/20/21 7026 03/20/21 0915 03/20/21 1215  ?BP: (!) 161/92 (!) 148/91 (!) 151/82 (!) 159/88  ?Pulse: 93 80 80 72  ?Resp: '18 18  16  '$ ?Temp: 98.3 ?F (36.8 ?C) 98.7 ?F (37.1 ?C) 98.1 ?F (36.7 ?C) 98.2 ?F (36.8 ?C)  ?TempSrc: Oral Oral Oral Oral  ?SpO2: 96% 93% 95% 96%  ?Weight:      ?Height:      ? ? ?Examination: ? ?GENERAL: No apparent distress.  Nontoxic. ?HEENT: MMM.  Vision grossly intact.  Hard of hearing. ?NECK: Supple.  No apparent JVD.  ?RESP:  No IWOB.  Fair aeration bilaterally. ?CVS:  RRR. Heart sounds normal.  ?ABD/GI/GU: BS+. Abd soft, NTND.  ?MSK/EXT:  Moves extremities.  Bulky dressing over RLE. ?SKIN: no apparent skin lesion or wound ?NEURO: Awake and alert.  Oriented to self and place.  Follows commands.  No apparent focal neuro deficit. ?PSYCH: Calm. Normal affect.  ? ?Procedures:  ?3/14-ORIF of right ankle fracture ? ?Microbiology summarized: ?COVID-19 and influenza PCR nonreactive. ? ?Assessment and Plan: ?* Closed right ankle fracture ?-S/p ORIF on 03/19/2021. ?-Management per primary team ? ?Confusion ?At risk for hospital delirium.  He is  also at risk of polypharmacy.  He has hard of hearing.  Oriented to self and place but not time.  Difficulty comprehending.  ?-Reorientation and delirium precautions ?-Minimize sedating medications ? ?Essential hypertension, benign ?BP remains elevated but improved.  ?-Increase home benazepril to 40 mg daily ?-Continue home Coreg and Cardizem CD ?-IV labetalol 10 mg as needed with parameters ?-Pain control ? ?Type 2 diabetes mellitus without complication, without long-term current use of insulin ([place]) ?Controlled with hyperglycemia.  A1c 6.1%. ?Recent Labs  ?Lab 03/19/21 ?3785 03/19/21 ?1532 03/19/21 ?2208 03/20/21 ?0741  ?GLUCAP 139* 143* 321* 181*  ?-Increase basal insulin from 10 units nightly to 10 units twice daily ?-Continue SSI-moderate ?-Continue home glimepiride and metformin ?-Further adjustment as appropriate ? ? ?Mood disorder ([place]) ?Decreased his Celexa to 20 mg daily given his age ? ?Paroxysmal atrial fibrillation (HCC) ?Rate controlled. ?-Continue home Coreg and Cardizem CD ?-Resume home Xarelto when okay from surgical standpoint ? ?BPH (benign prostatic hyperplasia) ?Continue home Flomax ? ?Hard of hearing ?Helpful if someone brings his hearing aid ? ?Dyslipidemia ?Continue home Lipitor ? ?GASTROESOPHAGEAL REFLUX DISEASE, CHRONIC ?Continue home PPI ? ?CAD, NATIVE VESSEL ?Continue home Coreg ? ?Hypothyroidism ?Continue home Synthroid ? ? ? ? ?  ? ?Body mass index is 26.5 kg/m?. ?  ?  ?  ?  ?DVT prophylaxis:  ?SCDs Start: 03/19/21 1959 ?Rivaroxaban (XARELTO) tablet 15 mg  ?Code Status: Full code ?Family Communication: Patient and/or RN. Available if any question.  ?Level of care: Med-Surg ? ?  Final disposition: Per primary. ? ? ?Sch Meds:  ?Scheduled Meds: ? acetaminophen  500 mg Oral Q6H  ? atorvastatin  10 mg Oral QHS  ? benazepril  40 mg Oral Daily  ? carvedilol  12.5 mg Oral BID WC  ? cholecalciferol  1,000 Units Oral QPM  ? citalopram  20 mg Oral QHS  ? diltiazem  240 mg Oral QHS  ? docusate sodium   100 mg Oral BID  ? famotidine  40 mg Oral QHS  ? folic acid  1 mg Oral [id number]  ? gabapentin  300 mg Oral TID  ? glimepiride  2 mg Oral BID WC  ? insulin aspart  0-15 Units Subcutaneous TID WC  ? insulin aspart  0-5 Units Subcutaneous QHS  ? insulin glargine-yfgn  10 Units Subcutaneous BID  ? levothyroxine  125 mcg Oral Q0600  ? metFORMIN  1,000 mg Oral Q breakfast  ? And  ? metFORMIN  500 mg Oral Q supper  ? pantoprazole  40 mg Oral Q1200  ? polyethylene glycol  17 g Oral Daily  ? Rivaroxaban  15 mg Oral QPM  ? tamsulosin  0.4 mg Oral QPC breakfast  ? traMADol  50 mg Oral Q6H  ? vitamin B-12  1,000 mcg Oral Daily  ? zinc sulfate  220 mg Oral Daily  ? ?Continuous Infusions: ? methocarbamol (ROBAXIN) IV    ? ?PRN Meds:.acetaminophen, albuterol, diphenhydrAMINE, HYDROcodone-acetaminophen, HYDROcodone-acetaminophen, labetalol, methocarbamol **OR** methocarbamol (ROBAXIN) IV, metoCLOPramide **OR** metoCLOPramide (REGLAN) injection, morphine injection, ondansetron **OR** ondansetron (ZOFRAN) IV ? ?Antimicrobials: ?Anti-infectives (From admission, onward)  ? ? Start     Dose/Rate Route Frequency Ordered Stop  ? 03/19/21 0930  ceFAZolin (ANCEF) IVPB 2g/100 mL premix       ? 2 g ?200 mL/hr over 30 Minutes Intravenous On call to O.R. 03/19/21 0928 03/19/21 1335  ? ?  ? ? ? ?I have personally reviewed the following labs and images: ?CBC: ?Recent Labs  ?Lab 03/14/21 ?0000 03/20/21 ?0515  ?WBC 10.2 9.3  ?HGB 11.0* 10.0*  ?HCT 35.9* 31.5*  ?MCV 86.3 84.7  ?PLT 273 311  ? ?BMP &GFR ?Recent Labs  ?Lab 03/14/21 ?0000 03/20/21 ?0515  ?NA 139 137  ?K 3.7 3.9  ?CL 103 102  ?CO2 27 26  ?GLUCOSE 179* 221*  ?BUN 13 18  ?CREATININE 0.87 0.80  ?CALCIUM 9.6 9.7  ? ?Estimated Creatinine Clearance: 70.6 mL/min (by C-G formula based on SCr of 0.8 mg/dL). ?Liver & Pancreas: ?Recent Labs  ?Lab 03/20/21 ?0515  ?AST 13*  ?ALT 12  ?ALKPHOS 93  ?BILITOT 0.4  ?PROT 6.0*  ?ALBUMIN 3.1*  ? ?No results for input(s): LIPASE, AMYLASE in the last 168  hours. ?No results for input(s): AMMONIA in the last 168 hours. ?Diabetic: ?Recent Labs  ?  03/19/21 ?1025  ?HGBA1C 6.1*  ? ?Recent Labs  ?Lab 03/19/21 ?1025 03/19/21 ?1532 03/19/21 ?2208 03/20/21 ?0741  ?GLUCAP 139* 143* 321* 181*  ? ?Cardiac Enzymes: ?No results for input(s): CKTOTAL, CKMB, CKMBINDEX, TROPONINI in the last 168 hours. ?No results for input(s): PROBNP in the last 8760 hours. ?Coagulation Profile: ?No results for input(s): INR, PROTIME in the last 168 hours. ?Thyroid Function Tests: ?No results for input(s): TSH, T4TOTAL, FREET4, T3FREE, THYROIDAB in the last 72 hours. ?Lipid Profile: ?No results for input(s): CHOL, HDL, LDLCALC, TRIG, CHOLHDL, LDLDIRECT in the last 72 hours. ?Anemia Panel: ?No results for input(s): VITAMINB12, FOLATE, FERRITIN, TIBC, IRON, RETICCTPCT in the last 72 hours. ?Urine analysis: ?   ?  Component Value Date/Time  ? COLORURINE YELLOW 01/22/2021 1801  ? APPEARANCEUR CLEAR 01/22/2021 1801  ? LABSPEC 1.015 01/22/2021 1801  ? PHURINE 7.5 01/22/2021 1801  ? GLUCOSEU NEGATIVE 01/22/2021 1801  ? [place] NEGATIVE 01/22/2021 1801  ? [place] NEGATIVE 01/22/2021 1801  ? [place] NEGATIVE 01/22/2021 1801  ? PROTEINUR NEGATIVE 01/22/2021 1801  ? NITRITE NEGATIVE 01/22/2021 1801  ? LEUKOCYTESUR NEGATIVE 01/22/2021 1801  ? ?Sepsis Labs: ?Invalid input(s): PROCALCITONIN, LACTICIDVEN ? ?Microbiology: ?No results found for this or any previous visit (from the past 240 hour(s)). ? ?Radiology Studies: ?DG MINI C-ARM IMAGE ONLY ? ?Result Date: 03/19/2021 ?There is no interpretation for this exam.  This order is for images obtained during a surgical procedure.  Please See "Surgeries" Tab for more information regarding the procedure.   ? ? ? ?Talton Delpriore T. Caidin Heidenreich ?[facility] ? ?If 7PM-7AM, please contact night-coverage ?[email or web address] ?03/20/2021, 12:25 PM   ?

## 2021-03-20 NOTE — [place] FL2 (Addendum)
?[place] MEDICAID FL2 LEVEL OF CARE SCREENING TOOL  ?  ? ?IDENTIFICATION  ?Patient Name: ?Brett Matthews Birthdate: 1932-12-21 Sex: male Admission Date (Current Location): ?03/19/2021  ?[place] and [place] Number: ? [place] ?  Facility and Address:  ?[facility],  [place], [place] ?     Provider Number: ?[id number]  ?Attending Physician Name and Address:  ?Renette Butters, MD ? Relative Name and Phone Number:  ?  ?   ?Current Level of Care: ?Hospital Recommended Level of Care: ?[place] Prior Approval Number: ?  ? ?Date Approved/Denied: ?  PASRR Number: ? [id number] ? ?Discharge Plan: ?SNF ?  ? ?Current Diagnoses: ?Patient Active Problem List  ? Diagnosis Date Noted  ? Confusion 03/20/2021  ? Hard of hearing 03/20/2021  ? BPH (benign prostatic hyperplasia) 03/20/2021  ? Mood disorder ([place]) 03/20/2021  ? Closed right ankle fracture 03/19/2021  ? Persistent atrial fibrillation ([place])   ? Acute respiratory failure with hypoxia ([place]) 08/19/2019  ? Pneumonia due to COVID-19 virus 08/18/2019  ? Chest pain of uncertain etiology 74/25/9563  ? H/O hemorrhoidectomy   ? Bleeding hemorrhoids 03/24/2017  ? Abdominal pain 03/02/2017  ? Anemia 03/02/2017  ? Rectal bleeding 03/17/2013  ? Dyslipidemia 07/24/2010  ? CAD, NATIVE VESSEL 09/26/2008  ? Hypothyroidism 08/06/2007  ? Type 2 diabetes mellitus without complication, without long-term current use of insulin ([place]) 08/06/2007  ? Essential hypertension, benign 08/06/2007  ? GASTROESOPHAGEAL REFLUX DISEASE, CHRONIC 08/06/2007  ? Paroxysmal atrial fibrillation ([place]) 08/06/2007  ? ? ?Orientation RESPIRATION BLADDER Height & Weight   ?  ?Self, Place, Situation ? Normal Continent Weight: 86.2 kg ?Height:  '5\' 11"'$  (180.3 cm)  ?BEHAVIORAL SYMPTOMS/MOOD NEUROLOGICAL BOWEL NUTRITION STATUS  ?    Continent Diet (Heart healthy/carb modified)  ?AMBULATORY STATUS COMMUNICATION OF NEEDS Skin   ?Extensive Assist Verbally Surgical wounds (RLE) ?   ?  ?  ?    ?     ?     ? ? ?Personal Care Assistance Level of Assistance  ?Bathing, Feeding, Dressing Bathing Assistance: Limited assistance ?Feeding assistance: Independent ?Dressing Assistance: Limited assistance ?   ? ?Functional Limitations Info  ?Sight, Hearing, Speech Sight Info: Impaired ?Hearing Info: Impaired ?Speech Info: Adequate  ? ? ?SPECIAL CARE FACTORS FREQUENCY  ?PT (By licensed PT), OT (By licensed OT)   ?  ?PT Frequency: 5 x weekly ?OT Frequency: 5 x weekly ?  ?  ?  ?   ? ? ?Contractures Contractures Info: Not present  ? ? ?Additional Factors Info  ?Code Status, Allergies Code Status Info: Full ?Allergies Info: None known ?  ?  ?  ?   ? ?Current Medications (03/20/2021):  This is the current hospital active medication list ?Current Facility-Administered Medications  ?Medication Dose Route Frequency Provider Last Rate Last Admin  ? acetaminophen (TYLENOL) tablet 325-650 mg  325-650 mg Oral Q6H PRN Aggie Moats M, PA-C      ? acetaminophen (TYLENOL) tablet 500 mg  500 mg Oral Q6H Gawne, Meghan M, PA-C   500 mg at 03/20/21 1148  ? albuterol (PROVENTIL) (2.5 MG/3ML) 0.083% nebulizer solution 3 mL  3 mL Inhalation Q6H PRN Aggie Moats M, PA-C      ? atorvastatin (LIPITOR) tablet 10 mg  10 mg Oral QHS Aggie Moats M, PA-C   10 mg at 03/19/21 2242  ? benazepril (LOTENSIN) tablet 40 mg  40 mg Oral Daily Wendee Beavers T, MD   40 mg at 03/20/21  1008  ? carvedilol (COREG) tablet 12.5 mg  12.5 mg Oral BID WC Aggie Moats M, PA-C   12.5 mg at 03/20/21 0754  ? cholecalciferol (VITAMIN D3) tablet 1,000 Units  1,000 Units Oral QPM Britt Bottom, PA-C   1,000 Units at 03/19/21 2245  ? citalopram (CELEXA) tablet 20 mg  20 mg Oral QHS Wendee Beavers T, MD      ? diltiazem (CARDIZEM CD) 24 hr capsule 240 mg  240 mg Oral QHS Aggie Moats M, PA-C   240 mg at 03/19/21 2243  ? diphenhydrAMINE (BENADRYL) 12.5 MG/5ML elixir 12.5-25 mg  12.5-25 mg Oral Q4H PRN Aggie Moats M, PA-C      ? docusate sodium (COLACE) capsule  100 mg  100 mg Oral BID Aggie Moats M, PA-C   100 mg at 03/20/21 1008  ? famotidine (PEPCID) tablet 40 mg  40 mg Oral QHS Aggie Moats M, PA-C   40 mg at 03/19/21 2255  ? folic acid (FOLVITE) tablet 1 mg  1 mg Oral Q1200 Aggie Moats M, PA-C   1 mg at 03/20/21 1148  ? gabapentin (NEURONTIN) capsule 300 mg  300 mg Oral TID Aggie Moats M, PA-C   300 mg at 03/20/21 1010  ? glimepiride (AMARYL) tablet 2 mg  2 mg Oral BID WC Aggie Moats M, PA-C   2 mg at 03/20/21 0759  ? HYDROcodone-acetaminophen (NORCO) 7.5-325 MG per tablet 1-2 tablet  1-2 tablet Oral Q4H PRN Aggie Moats M, PA-C      ? HYDROcodone-acetaminophen (NORCO/VICODIN) 5-325 MG per tablet 1-2 tablet  1-2 tablet Oral Q4H PRN Britt Bottom, PA-C   1 tablet at 03/20/21 1206  ? insulin aspart (novoLOG) injection 0-15 Units  0-15 Units Subcutaneous TID WC Marcelyn Bruins, MD   3 Units at 03/20/21 1207  ? insulin aspart (novoLOG) injection 0-5 Units  0-5 Units Subcutaneous QHS Marcelyn Bruins, MD   4 Units at 03/19/21 2308  ? insulin glargine-yfgn (SEMGLEE) injection 10 Units  10 Units Subcutaneous BID Wendee Beavers T, MD   10 Units at 03/20/21 1300  ? labetalol (NORMODYNE) injection 10 mg  10 mg Intravenous Q2H PRN Mercy Riding, MD      ? levothyroxine (SYNTHROID) tablet 125 mcg  125 mcg Oral Q0600 Britt Bottom, PA-C   125 mcg at 03/20/21 0540  ? metFORMIN (GLUCOPHAGE) tablet 1,000 mg  1,000 mg Oral Q breakfast Renette Butters, MD   1,000 mg at 03/20/21 0754  ? And  ? metFORMIN (GLUCOPHAGE) tablet 500 mg  500 mg Oral Q supper Renette Butters, MD      ? methocarbamol (ROBAXIN) tablet 500 mg  500 mg Oral Q6H PRN Britt Bottom, PA-C      ? Or  ? methocarbamol (ROBAXIN) 500 mg in dextrose 5 % 50 mL IVPB  500 mg Intravenous Q6H PRN Gawne, Meghan M, PA-C      ? metoCLOPramide (REGLAN) tablet 5-10 mg  5-10 mg Oral Q8H PRN Aggie Moats M, PA-C      ? Or  ? metoCLOPramide (REGLAN) injection 5-10 mg  5-10 mg Intravenous Q8H PRN Gawne, Meghan M,  PA-C      ? morphine (PF) 2 MG/ML injection 0.5-1 mg  0.5-1 mg Intravenous Q2H PRN Gawne, Meghan M, PA-C      ? ondansetron (ZOFRAN) tablet 4 mg  4 mg Oral Q6H PRN Britt Bottom, PA-C      ? Or  ? ondansetron (  ZOFRAN) injection 4 mg  4 mg Intravenous Q6H PRN Madelon Lips, Meghan M, PA-C      ? pantoprazole (PROTONIX) EC tablet 40 mg  40 mg Oral Q1200 Aggie Moats M, PA-C   40 mg at 03/20/21 1148  ? polyethylene glycol (MIRALAX / GLYCOLAX) packet 17 g  17 g Oral Daily Aggie Moats M, PA-C   17 g at 03/20/21 1009  ? Rivaroxaban (XARELTO) tablet 15 mg  15 mg Oral QPM Gawne, Meghan M, PA-C      ? tamsulosin (FLOMAX) capsule 0.4 mg  0.4 mg Oral QPC breakfast Aggie Moats M, PA-C   0.4 mg at 03/20/21 0845  ? traMADol (ULTRAM) tablet 50 mg  50 mg Oral Q6H Aggie Moats M, PA-C   50 mg at 03/20/21 1148  ? vitamin B-12 (CYANOCOBALAMIN) tablet 1,000 mcg  1,000 mcg Oral Daily Aggie Moats M, PA-C   1,000 mcg at 03/20/21 1008  ? zinc sulfate capsule 220 mg  220 mg Oral Daily Renette Butters, MD   220 mg at 03/20/21 1009  ? ? ? ?Discharge Medications: ?Please see discharge summary for a list of discharge medications. ? ?Relevant Imaging Results: ? ?Relevant Lab Results: ? ? ?Additional Information ?NWB RLE ss# [id number] ? ?Andre Swander, Marjie Skiff, RN ? ? ? ? ?

## 2021-03-20 NOTE — Assessment & Plan Note (Addendum)
Decreased his Celexa to 20 mg daily given his age ?

## 2021-03-20 NOTE — Assessment & Plan Note (Addendum)
Rate controlled. ?-Continue home Coreg, Cardizem CD and Xarelto ?

## 2021-03-21 DIAGNOSIS — E119 Type 2 diabetes mellitus without complications: Secondary | ICD-10-CM | POA: Diagnosis not present

## 2021-03-21 DIAGNOSIS — N400 Enlarged prostate without lower urinary tract symptoms: Secondary | ICD-10-CM | POA: Diagnosis not present

## 2021-03-21 DIAGNOSIS — R410 Disorientation, unspecified: Secondary | ICD-10-CM | POA: Diagnosis not present

## 2021-03-21 DIAGNOSIS — E876 Hypokalemia: Secondary | ICD-10-CM

## 2021-03-21 DIAGNOSIS — D649 Anemia, unspecified: Secondary | ICD-10-CM

## 2021-03-21 DIAGNOSIS — S82891A Other fracture of right lower leg, initial encounter for closed fracture: Secondary | ICD-10-CM | POA: Diagnosis not present

## 2021-03-21 LAB — RENAL FUNCTION PANEL
Albumin: 3 g/dL — ABNORMAL LOW (ref 3.5–5.0)
Anion gap: 6 (ref 5–15)
BUN: 14 mg/dL (ref 8–23)
CO2: 29 mmol/L (ref 22–32)
Calcium: 9.2 mg/dL (ref 8.9–10.3)
Chloride: 101 mmol/L (ref 98–111)
Creatinine, Ser: 0.87 mg/dL (ref 0.61–1.24)
GFR, Estimated: >60 mL/min (ref >60)
Glucose, Bld: 75 mg/dL (ref 70–99)
Phosphorus: 3 mg/dL (ref 2.5–4.6)
Potassium: 3.3 mmol/L — ABNORMAL LOW (ref 3.5–5.1)
Sodium: 136 mmol/L (ref 135–145)

## 2021-03-21 LAB — CBC
HCT: 30.9 % — ABNORMAL LOW (ref 39.0–52.0)
Hemoglobin: 9.6 g/dL — ABNORMAL LOW (ref 13.0–17.0)
MCH: 26.7 pg (ref 26.0–34.0)
MCHC: 31.1 g/dL (ref 30.0–36.0)
MCV: 85.8 fL (ref 80.0–100.0)
Platelets: 342 10*3/uL (ref 150–400)
RBC: 3.6 MIL/uL — ABNORMAL LOW (ref 4.22–5.81)
RDW: 14.2 % (ref 11.5–15.5)
WBC: 8.2 10*3/uL (ref 4.0–10.5)
nRBC: 0 % (ref 0.0–0.2)

## 2021-03-21 LAB — RETICULOCYTES
Immature Retic Fract: 19.8 % — ABNORMAL HIGH (ref 2.3–15.9)
RBC.: 3.56 MIL/uL — ABNORMAL LOW (ref 4.22–5.81)
Retic Count, Absolute: 55.9 10*3/uL (ref 19.0–186.0)
Retic Ct Pct: 1.6 % (ref 0.4–3.1)

## 2021-03-21 LAB — FOLATE: Folate: 34.1 ng/mL (ref >5.9)

## 2021-03-21 LAB — IRON AND TIBC
Iron: 30 ug/dL — ABNORMAL LOW (ref 45–182)
Saturation Ratios: 9 % — ABNORMAL LOW (ref 17.9–39.5)
TIBC: 328 ug/dL (ref 250–450)
UIBC: 298 ug/dL

## 2021-03-21 LAB — GLUCOSE, CAPILLARY
Glucose-Capillary: 82 mg/dL (ref 70–99)
Glucose-Capillary: 218 mg/dL — ABNORMAL HIGH (ref 70–99)
Glucose-Capillary: 204 mg/dL — ABNORMAL HIGH (ref 70–99)
Glucose-Capillary: 146 mg/dL — ABNORMAL HIGH (ref 70–99)

## 2021-03-21 LAB — VITAMIN B12: Vitamin B-12: 1435 pg/mL — ABNORMAL HIGH (ref 180–914)

## 2021-03-21 LAB — MAGNESIUM: Magnesium: 1.7 mg/dL (ref 1.7–2.4)

## 2021-03-21 LAB — FERRITIN: Ferritin: 131 ng/mL (ref 24–336)

## 2021-03-21 MED ORDER — LORAZEPAM 2 MG/ML IJ SOLN
0.2500 mg | Freq: Once | INTRAMUSCULAR | Status: AC
  Administered 2021-03-21: 0.25 mg via INTRAVENOUS
  Filled 2021-03-21: qty 1

## 2021-03-21 MED ORDER — SENNOSIDES-DOCUSATE SODIUM 8.6-50 MG PO TABS: 1.0000 | ORAL_TABLET | Freq: Two times a day (BID) | ORAL | Status: DC | PRN

## 2021-03-21 MED ORDER — QUETIAPINE FUMARATE 25 MG PO TABS
25.0000 mg | ORAL_TABLET | Freq: Every day | ORAL | Status: DC
  Administered 2021-03-21: 25 mg via ORAL
  Filled 2021-03-21: qty 1

## 2021-03-21 MED ORDER — ACETAMINOPHEN 500 MG PO TABS
1000.0000 mg | ORAL_TABLET | Freq: Three times a day (TID) | ORAL | Status: DC
  Administered 2021-03-21 – 2021-03-22 (×5): 1000 mg via ORAL
  Filled 2021-03-21 (×5): qty 2

## 2021-03-21 MED ORDER — OXYCODONE HCL 5 MG PO TABS: 5.0000 mg | ORAL_TABLET | Freq: Three times a day (TID) | ORAL | Status: DC | PRN

## 2021-03-21 MED ORDER — TRAMADOL HCL 50 MG PO TABS
50.0000 mg | ORAL_TABLET | Freq: Three times a day (TID) | ORAL | Status: DC | PRN
  Administered 2021-03-22 – 2021-03-23 (×3): 50 mg via ORAL
  Filled 2021-03-21 (×4): qty 1

## 2021-03-21 MED ORDER — METHOCARBAMOL 500 MG PO TABS
500.0000 mg | ORAL_TABLET | Freq: Three times a day (TID) | ORAL | Status: DC | PRN
  Administered 2021-03-22 (×2): 500 mg via ORAL
  Filled 2021-03-21 (×2): qty 1

## 2021-03-21 MED ORDER — MAGNESIUM SULFATE 2 GM/50ML IV SOLN
2.0000 g | Freq: Once | INTRAVENOUS | Status: AC
  Administered 2021-03-21: 2 g via INTRAVENOUS
  Filled 2021-03-21: qty 50

## 2021-03-21 MED ORDER — POTASSIUM CHLORIDE CRYS ER 20 MEQ PO TBCR
40.0000 meq | EXTENDED_RELEASE_TABLET | ORAL | Status: AC
  Administered 2021-03-21 (×2): 40 meq via ORAL
  Filled 2021-03-21 (×2): qty 2

## 2021-03-21 MED ORDER — POLYETHYLENE GLYCOL 3350 17 G PO PACK: 17.0000 g | PACK | Freq: Two times a day (BID) | ORAL | Status: DC | PRN

## 2021-03-21 NOTE — Progress Notes (Signed)
?PROGRESS NOTE ? ?Brett Matthews KZS:[id number] DOB: 17-Sep-1932  ? ?PCP: Glenda Chroman, MD ? ?Patient is from: Home ? ?DOA: 03/19/2021 LOS: 1 ? ?Chief complaints ?No chief complaint on file. ?  ? ?Brief Narrative / Interim history: ?88 year old M with PMH of CAD, paroxysmal A-fib, DM-2, HTN, GERD, diminished hearing, hypothyroidism, anemia, hemorrhoids and diverticulosis admitted by orthopedic surgery for right ankle fracture.  Underwent ORIF on 03/19/2021.  Hospital service consulted for assistance with chronic medical condition including uncontrolled hypertension and diabetes. ?Patient seems to have hospital delirium.   ? ?Subjective: ?Seen and examined earlier this morning.  He was agitated and confused requiring IV Ativan overnight.  He was sleepy this morning.  He wakes to voice.  Only oriented to self.  Surprised to hear that he is in the hospital.  He reports minimal pain in his foot.  He responds no to chest pain, shortness of breath, abdominal pain or UTI symptoms. ? ?Objective: ?Vitals:  ? 03/20/21 0915 03/20/21 1215 03/20/21 2118 03/21/21 0535  ?BP: (!) 151/82 (!) 159/88 (!) 143/75 (!) 148/117  ?Pulse: 80 72 67 (!) 55  ?Resp:  '16 18 14  '$ ?Temp: 98.1 ?F (36.7 ?C) 98.2 ?F (36.8 ?C) 98.3 ?F (36.8 ?C) 98 ?F (36.7 ?C)  ?TempSrc: Oral Oral Oral Oral  ?SpO2: 95% 96% 96% 90%  ?Weight:      ?Height:      ? ? ?Examination: ? ?GENERAL: No apparent distress.  Nontoxic. ?HEENT: MMM.  Vision and hearing grossly intact.  ?NECK: Supple.  No apparent JVD.  ?RESP: On RA.  No IWOB.  Fair aeration bilaterally. ?CVS:  RRR. Heart sounds normal.  ?ABD/GI/GU: BS+. Abd soft, NTND.  ?MSK/EXT:  Moves extremities.  Bulky dressing over RLE. ?SKIN: no apparent skin lesion or wound ?NEURO: Sleepy but wakes to voice.  Only oriented to self.  Follows commands.  No apparent focal neuro deficit. ?PSYCH: Calm. Normal affect.  ? ?Procedures:  ?3/14-ORIF of right ankle fracture ? ?Microbiology summarized: ?COVID-19 and influenza PCR  nonreactive. ? ?Assessment and Plan: ?* Closed right ankle fracture ?-S/p ORIF on 03/19/2021. ?-Management per primary team ?-Adjusted pain medications given confusion and delirium ?-Bowel regimen to avoid constipation ?-Already on Xarelto ?-Therapy recommended SNF. ? ?Confusion ?Multifactorial including hospital delirium, polypharmacy, hard of hearing and underlying cognitive impairment.  Seems to have intermittent confusion at baseline per family.  Was agitated overnight requiring IV Ativan.  He was sleepy this morning.  Wakes to voice, and only oriented to self. Surprised to hear he is in the hospital.  ?-Reorientation, delirium and fall precautions. ?-Minimize sedating medications-adjusted pain medications ?-P.o. Seroquel 25 mg at night ? ?Essential hypertension, benign ?BP remains elevated but improved.  BP unreliable at times due to agitation ?-Increased home benazepril to 40 mg daily on 3/15 ?-Continue home Coreg and Cardizem CD ?-IV labetalol 10 mg as needed with parameters ?-Pain control ? ?Type 2 diabetes mellitus without complication, without long-term current use of insulin ([place]) ?Controlled with hyperglycemia.  A1c 6.1%. ?Recent Labs  ?Lab 03/19/21 ?2208 03/20/21 ?0741 03/20/21 ?1703 03/20/21 ?2100 03/21/21 ?7322  ?GLUCAP 321* 181* 87 111* 82  ?-Continue basal insulin at 10 units twice daily ?-Continue SSI-moderate ?-Stop home metformin and glimepiride while in-house. ?-Further adjustment as appropriate ? ? ?Mood disorder ([place]) ?Decreased his Celexa to 20 mg daily given his age ? ?Paroxysmal atrial fibrillation ([facility]) ?Rate controlled. ?-Continue home Coreg and Cardizem CD ?-Resume home Xarelto when okay from surgical standpoint ? ?BPH (benign prostatic  hyperplasia) ?Continue home Flomax ?Monitor urine output ? ?Hard of hearing ?Unfortunately, his hearing aid is not working ? ?Normocytic anemia ?Recent Labs  ?  01/22/21 ?1725 03/14/21 ?0000 03/20/21 ?9678 03/21/21 ?9381  ?HGB 12.7* 11.0* 10.0* 9.6*  ?H&H  seems to be stable after initial drop ?-Monitor ?-Check anemia panel in the morning ? ? ?Hypokalemia ?K3.3.  Mg 1.7. ?-P.o. KCl 40x2 ?-IV magnesium sulfate 2 g x 1 ? ?Dyslipidemia ?Continue home Lipitor ? ?GASTROESOPHAGEAL REFLUX DISEASE, CHRONIC ?Continue home PPI ? ?CAD, NATIVE VESSEL ?Continue home Coreg ? ?Hypothyroidism ?Continue home Synthroid ? ? ? ? ?  ? ?Body mass index is 26.5 kg/m?. ?  ?  ?  ?  ?DVT prophylaxis:  ?SCDs Start: 03/19/21 1959 ?Rivaroxaban (XARELTO) tablet 15 mg  ?Code Status: Full code ?Family Communication: Updated patient's friend and significant other over the phone on 3/15. ?Level of care: Med-Surg ? ?Final disposition: Per primary. ? ? ?Sch Meds:  ?Scheduled Meds: ? acetaminophen  1,000 mg Oral Q8H  ? atorvastatin  10 mg Oral QHS  ? benazepril  40 mg Oral Daily  ? carvedilol  12.5 mg Oral BID WC  ? cholecalciferol  1,000 Units Oral QPM  ? citalopram  20 mg Oral QHS  ? diltiazem  240 mg Oral QHS  ? docusate sodium  100 mg Oral BID  ? famotidine  40 mg Oral QHS  ? folic acid  1 mg Oral [id number]  ? gabapentin  300 mg Oral TID  ? insulin aspart  0-15 Units Subcutaneous TID WC  ? insulin aspart  0-5 Units Subcutaneous QHS  ? insulin glargine-yfgn  10 Units Subcutaneous BID  ? levothyroxine  125 mcg Oral Q0600  ? pantoprazole  40 mg Oral Q1200  ? potassium chloride  40 mEq Oral Q4H  ? Rivaroxaban  15 mg Oral QPM  ? tamsulosin  0.4 mg Oral QPC breakfast  ? vitamin B-12  1,000 mcg Oral Daily  ? zinc sulfate  220 mg Oral Daily  ? ?Continuous Infusions: ? magnesium sulfate bolus IVPB    ? ? ?PRN Meds:.acetaminophen, albuterol, labetalol, methocarbamol, metoCLOPramide **OR** metoCLOPramide (REGLAN) injection, ondansetron **OR** ondansetron (ZOFRAN) IV, oxyCODONE, polyethylene glycol, senna-docusate, traMADol ? ?Antimicrobials: ?Anti-infectives (From admission, onward)  ? ? Start     Dose/Rate Route Frequency Ordered Stop  ? 03/19/21 0930  ceFAZolin (ANCEF) IVPB 2g/100 mL premix       ? 2 g ?200 mL/hr  over 30 Minutes Intravenous On call to O.R. 03/19/21 0928 03/19/21 1335  ? ?  ? ? ? ?I have personally reviewed the following labs and images: ?CBC: ?Recent Labs  ?Lab 03/20/21 ?0258 03/21/21 ?5277  ?WBC 9.3 8.2  ?HGB 10.0* 9.6*  ?HCT 31.5* 30.9*  ?MCV 84.7 85.8  ?PLT 311 342  ? ?BMP &GFR ?Recent Labs  ?Lab 03/20/21 ?8242 03/21/21 ?3536 03/21/21 ?1443  ?NA 137  --  136  ?K 3.9  --  3.3*  ?CL 102  --  101  ?CO2 26  --  29  ?GLUCOSE 221*  --  75  ?BUN 18  --  14  ?CREATININE 0.80  --  0.87  ?CALCIUM 9.7  --  9.2  ?MG  --  1.7  --   ?PHOS  --   --  3.0  ? ?Estimated Creatinine Clearance: 64.9 mL/min (by C-G formula based on SCr of 0.87 mg/dL). ?Liver & Pancreas: ?Recent Labs  ?Lab 03/20/21 ?1540 03/21/21 ?0867  ?AST 13*  --   ?ALT  12  --   ?ALKPHOS 93  --   ?BILITOT 0.4  --   ?PROT 6.0*  --   ?ALBUMIN 3.1* 3.0*  ? ?No results for input(s): LIPASE, AMYLASE in the last 168 hours. ?No results for input(s): AMMONIA in the last 168 hours. ?Diabetic: ?Recent Labs  ?  03/19/21 ?1025  ?HGBA1C 6.1*  ? ?Recent Labs  ?Lab 03/19/21 ?2208 03/20/21 ?0741 03/20/21 ?1703 03/20/21 ?2100 03/21/21 ?8315  ?GLUCAP 321* 181* 87 111* 82  ? ?Cardiac Enzymes: ?No results for input(s): CKTOTAL, CKMB, CKMBINDEX, TROPONINI in the last 168 hours. ?No results for input(s): PROBNP in the last 8760 hours. ?Coagulation Profile: ?No results for input(s): INR, PROTIME in the last 168 hours. ?Thyroid Function Tests: ?No results for input(s): TSH, T4TOTAL, FREET4, T3FREE, THYROIDAB in the last 72 hours. ?Lipid Profile: ?No results for input(s): CHOL, HDL, LDLCALC, TRIG, CHOLHDL, LDLDIRECT in the last 72 hours. ?Anemia Panel: ?Recent Labs  ?  03/21/21 ?0923  ?RETICCTPCT 1.6  ? ?Urine analysis: ?   ?Component Value Date/Time  ? COLORURINE YELLOW 01/22/2021 1801  ? APPEARANCEUR CLEAR 01/22/2021 1801  ? LABSPEC 1.015 01/22/2021 1801  ? PHURINE 7.5 01/22/2021 1801  ? GLUCOSEU NEGATIVE 01/22/2021 1801  ? [place] NEGATIVE 01/22/2021 1801  ? [place] NEGATIVE  01/22/2021 1801  ? [place] NEGATIVE 01/22/2021 1801  ? PROTEINUR NEGATIVE 01/22/2021 1801  ? NITRITE NEGATIVE 01/22/2021 1801  ? LEUKOCYTESUR NEGATIVE 01/22/2021 1801  ? ?Sepsis Labs: ?Invalid input(s): PROCALCI

## 2021-03-21 NOTE — TOC Progression Note (Signed)
Transition of Care (TOC) - Progression Note  ? ? ?Patient Details  ?Name: Brett Matthews ?MRN: [id number] ?Date of Birth: 11-21-1932 ? ?Transition of Care (TOC) CM/SW Contact  ?Izzah Pasqua, Marjie Skiff, RN ?Phone Number: ?03/21/2021, 2:15 PM ? ?Clinical Narrative:    ?SNF bed offers provided to pt significant other [name]. [facility] was chosen. Insurance auth received from [facility] for 7 days 93228. Auth was also approved for PTAR transport to SNF [address]3230. Debbie from [facility] notified of bed acceptance. Per Ortho PA pt can dc to SNF tomorrow. TOC will continue to follow. ? ? ?Expected Discharge Plan: [place] ?Barriers to Discharge: Continued Medical Work up ? ?Expected Discharge Plan and Services ?Expected Discharge Plan: [place] ?  ?Discharge Planning Services: CM Consult ?  ?Living arrangements for the past 2 months: [place] ?Expected Discharge Date: 03/19/21               ?  ?  ?  ?Readmission Risk Interventions ?No flowsheet data found. ? ?

## 2021-03-21 NOTE — Progress Notes (Signed)
? ? ?  OVERNIGHT PROGRESS REPORT ? ?Notified by RN for agitation similar to last night. ?Reordered Ativan at same dose as prior. ? ?Patient is agitated and aggressive as previously. ? ? ?Gershon Cull MSNA MSN ACNPC-AG ?Acute Care Nurse Practitioner ?[facility] ?[place] ? ? ? ?

## 2021-03-21 NOTE — Progress Notes (Signed)
Physical Therapy Treatment ?Patient Details ?Name: Brett Matthews ?MRN: [id number] ?DOB: January 12, 1932 ?Today's Date: 03/21/2021 ? ? ?History of Present Illness Patient is 88 y.o. male s/p ORIF on 03/19/21 for Rt ankle fracture, now NWB. Patient  presented for surgery after fall on 3/8 while trying to get into back seat of car, pt his his head during the fall. PMH significant for OA, anemia, DMII, HTN, GERD, hypothyroidism, memory impairment, PAF. ? ?  ?PT Comments  ? ? Patient slightly groggy in bed but alert to voice of therapist and appeared to understand and more able to hear cues this session. Pt required Min-Mod Assist for bed mobility and cues for use of bed rail. HE completed 5x sit<>stand from elevated bed height with Mod+2 assist and was unable to maintain NWB precautions to Rt LE. With verbal/tactile cues pt was able to march Rt LE up for 2x 5 reps during separate stands to encourage hip flexion for NWB on Rt LE. He remains confused about place and situation but was redirectable today and pleasant. He will benefit from skilled PT interventions to progress mobility as able and improve independence. ? ?  ?Recommendations for follow up therapy are one component of a multi-disciplinary discharge planning process, led by the attending physician.  Recommendations may be updated based on patient status, additional functional criteria and insurance authorization. ? ?Follow Up Recommendations ? Skilled nursing-short term rehab (<3 hours/day) ?  ?  ?Assistance Recommended at Discharge Frequent or constant Supervision/Assistance  ?Patient can return home with the following Two people to help with walking and/or transfers;Two people to help with bathing/dressing/bathroom;Assistance with cooking/housework;Assistance with feeding;Direct supervision/assist for medications management;Direct supervision/assist for financial management;Assist for transportation;Help with stairs or ramp for entrance ?  ?Equipment  Recommendations ? None recommended by PT  ?  ?Recommendations for Other Services   ? ? ?  ?Precautions / Restrictions Precautions ?Precautions: Fall ?Precaution Comments: pt has significant fall hx: at least 6 in last month, caregivers report unknown number in last 6 months due to frequency. ?Restrictions ?Weight Bearing Restrictions: Yes ?RLE Weight Bearing: Non weight bearing  ?  ? ?Mobility ? Bed Mobility ?Overal bed mobility: Needs Assistance ?Bed Mobility: Supine to Sit, Sit to Supine ?  ?  ?Supine to sit: Min assist, Mod assist, HOB elevated ?Sit to supine: +2 for safety/equipment, Min assist ?  ?General bed mobility comments: Verbal/Tactile Cues required to initiate sitting up to EOB. pt able to initiate bringing LE's off EOB, Mod assist needed to fully raise trunk and turn. +2 for safety to scoot anterior and place feet on floor. Min assist to control lowering back to supine and raise bil LE's onto bed at EOS. ?  ? ?Transfers ?Overall transfer level: Needs assistance ?Equipment used: [place] (2 wheels) ?Transfers: Sit to/from Stand ?Sit to Stand: Mod assist, +2 physical assistance, +2 safety/equipment, From elevated surface ?  ?  ?  ?  ?  ?General transfer comment: Mod+2 assist for rise from EOB. pt repeated cues to deter WB on Rt LE and manual assist to move Rt LE forward and reduce weight bearing. Pt unable to keep Rt foot above ground and placed on therapist foot. Pt stood 5x from EOB. completed marching with Rt LE and assist to lift from therapist. ?  ? ?Ambulation/Gait ?  ?  ?  ?  ?  ?  ?  ?  ? ? ?Stairs ?  ?  ?  ?  ?  ? ? ?Wheelchair Mobility ?  ? ?  Modified Rankin (Stroke Patients Only) ?  ? ? ?  ?Balance Overall balance assessment: History of Falls, Needs assistance ?Sitting-balance support: Feet supported, Bilateral upper extremity supported ?Sitting balance-Leahy Scale: Fair ?  ?  ?Standing balance support: Reliant on assistive device for balance, Bilateral upper extremity supported ?Standing  balance-Leahy Scale: Poor ?Standing balance comment: HIGH FALL RISK, heavy reliance on RW and therapist ?  ?  ?  ?  ?  ?  ?  ?  ?  ?  ?  ?  ? ?  ?Cognition Arousal/Alertness: Awake/alert ?Behavior During Therapy: [facility] for tasks assessed/performed ?Overall Cognitive Status: No family/caregiver present to determine baseline cognitive functioning ?Area of Impairment: Orientation, Following commands, Safety/judgement, Problem solving, Memory ?  ?  ?  ?  ?  ?  ?  ?  ?Orientation Level: Disoriented to, Place, Situation, Time ?  ?Memory: Decreased short-term memory, Decreased recall of precautions ?Following Commands: Follows one step commands with increased time ?Safety/Judgement: Decreased awareness of safety, Decreased awareness of deficits ?  ?Problem Solving: Requires verbal cues, Requires tactile cues ?General Comments: pt had improved hearing today (no hearing aids noted) pt followed commands with occasional repetition required ?  ?  ? ?  ?Exercises   ? ?  ?General Comments   ?  ?  ? ?Pertinent Vitals/Pain Pain Assessment ?Pain Assessment: Faces ?Faces Pain Scale: Hurts a little bit ?Pain Location: Rt foot ?Pain Descriptors / Indicators: Discomfort ?Pain Intervention(s): Limited activity within patient's tolerance, Monitored during session, Repositioned  ? ? ?Home Living   ?  ?  ?  ?  ?  ?  ?  ?  ?  ?   ?  ?Prior Function    ?  ?  ?   ? ?PT Goals (current goals can now be found in the care plan section) Acute Rehab PT Goals ?Patient Stated Goal: unable to state ?PT Goal Formulation: Patient unable to participate in goal setting ?Time For Goal Achievement: 04/02/21 ?Potential to Achieve Goals: Poor ?Progress towards PT goals: Progressing toward goals ? ?  ?Frequency ? ? ? Min 3X/week ? ? ? ?  ?PT Plan Current plan remains appropriate  ? ? ?Co-evaluation   ?  ?  ?  ?  ? ?  ?AM-PAC PT "6 Clicks" Mobility   ?Outcome Measure ? Help needed turning from your back to your side while in a flat bed without using bedrails?: A  Lot ?Help needed moving from lying on your back to sitting on the side of a flat bed without using bedrails?: A Lot ?Help needed moving to and from a bed to a chair (including a wheelchair)?: Total ?Help needed standing up from a chair using your arms (e.g., wheelchair or bedside chair)?: Total ?Help needed to walk in hospital room?: Total ?Help needed climbing 3-5 steps with a railing? : Total ?6 Click Score: 8 ? ?  ?End of Session Equipment Utilized During Treatment: Gait belt ?Activity Tolerance: Patient tolerated treatment well ?Patient left: in bed;with call bell/phone within reach;with nursing/sitter in room ?Nurse Communication: Mobility status ?PT Visit Diagnosis: Muscle weakness (generalized) (M62.81);Difficulty in walking, not elsewhere classified (R26.2);Other abnormalities of gait and mobility (R26.89) ?  ? ? ?Time: 0938-1829 ?PT Time Calculation (min) (ACUTE ONLY): 20 min ? ?Charges:  $Therapeutic Activity: 8-22 mins          ?          ? ?Gwynneth Albright PT, DPT ?Acute Rehabilitation Services ?Office [phone] ?Pager [phone]  ? ? ?  Brett Matthews ?03/21/2021, 3:56 PM ? ?

## 2021-03-21 NOTE — Assessment & Plan Note (Addendum)
Recent Labs  ?  01/22/21 ?1725 03/14/21 ?0000 03/20/21 ?7998 03/21/21 ?7215 03/22/21 ?8727  ?HGB 12.7* 11.0* 10.0* 9.6* 9.8*  ?H&H seems to be stable after initial drop.  Anemia panel with some degree of iron deficiency ?-P.o. ferrous sulfate ?

## 2021-03-21 NOTE — Progress Notes (Signed)
Pt was agitated, attempting to get out of the bed. Aggressive toward the staff and  trying to hit. On call provider notified, one time order for ativan 0.25 mg IV was given. ?

## 2021-03-21 NOTE — Assessment & Plan Note (Addendum)
Resolved

## 2021-03-21 NOTE — Progress Notes (Signed)
? ? ?Subjective: ?Patient is a horrible historian and can add little to nothing to his exam. VERY HOH. Still doesn't know where he is or why he is there, who did his surgery, what the date is. Constant reorientation needed. Unsure of baseline cognition issues vs anesthesia/narcotics causing his current mental state. Required IV Ativan overnight to calm his agitation. ? ?PT sessions have not gone that well. Hopefully continued visits will help improve his ability to mobilize and transfer while maintaining WB status but I'm very cautious about his ability to progress. ? ?Objective:  ? ?VITALS:   ?Vitals:  ? 03/20/21 1215 03/20/21 2118 03/21/21 0535 03/21/21 1247  ?BP: (!) 159/88 (!) 143/75 (!) 148/117 (!) 157/77  ?Pulse: 72 67 (!) 55 66  ?Resp: '16 18 14 18  '$ ?Temp: 98.2 ?F (36.8 ?C) 98.3 ?F (36.8 ?C) 98 ?F (36.7 ?C) 98.1 ?F (36.7 ?C)  ?TempSrc: Oral Oral Oral Oral  ?SpO2: 96% 96% 90% 97%  ?Weight:      ?Height:      ? ?CBC Latest Ref Rng & Units 03/21/2021 03/20/2021 03/14/2021  ?WBC 4.0 - 10.5 K/uL 8.2 9.3 10.2  ?Hemoglobin 13.0 - 17.0 g/dL 9.6(L) 10.0(L) 11.0(L)  ?Hematocrit 39.0 - 52.0 % 30.9(L) 31.5(L) 35.9(L)  ?Platelets 150 - 400 K/uL 342 311 273  ? ?BMP Latest Ref Rng & Units 03/21/2021 03/20/2021 03/14/2021  ?Glucose 70 - 99 mg/dL 75 221(H) 179(H)  ?BUN 8 - 23 mg/dL '14 18 13  '$ ?Creatinine 0.61 - 1.24 mg/dL 0.87 0.80 0.87  ?BUN/Creat Ratio 10 - 24 - - -  ?Sodium 135 - 145 mmol/L 136 137 139  ?Potassium 3.5 - 5.1 mmol/L 3.3(L) 3.9 3.7  ?Chloride 98 - 111 mmol/L 101 102 103  ?CO2 22 - 32 mmol/L '29 26 27  '$ ?Calcium 8.9 - 10.3 mg/dL 9.2 9.7 9.6  ? ?Intake/Output   ?   03/15 0701 ?03/16 0700 03/16 0701 ?03/17 0700  ? P.O. 720 120  ? I.V. (mL/kg)    ? IV Piggyback    ? Total Intake(mL/kg) 720 (8.4) 120 (1.4)  ? Urine (mL/kg/hr) 1100 (0.5)   ? Blood    ? Total Output 1100   ? Net -380 +120  ?     ? Urine Occurrence  1 x  ?  ? ? ?Physical Exam: ?General: NAD. Very confused. Nonsensical rambling. Unaware of his surgery ?Resp: No  increased wob ?Cardio: regular rate and rhythm ?ABD soft ?Neurologically intact ?MSK ?Neurovascularly intact ?Sensation intact distally ?Intact pulses distally ?Could barely get him to wiggle his toes ?Incision: dressing C/D/I ? ? ?Assessment: ?2 Days Post-Op  ?S/P Procedure(s) (LRB): ?OPEN REDUCTION INTERNAL FIXATION (ORIF) ANKLE FRACTURE, COLATERAL ANKLE LIGAMENT REPAIR (Right) ?by Dr. Ernesta Amble. Murphy on 03/19/21 ? ?Principal Problem: ?  Closed right ankle fracture ?Active Problems: ?  Hypothyroidism ?  Type 2 diabetes mellitus without complication, without long-term current use of insulin ([place]) ?  Essential hypertension, benign ?  CAD, NATIVE VESSEL ?  GASTROESOPHAGEAL REFLUX DISEASE, CHRONIC ?  Paroxysmal atrial fibrillation (HCC) ?  Dyslipidemia ?  Hypokalemia ?  Normocytic anemia ?  Confusion ?  Hard of hearing ?  BPH (benign prostatic hyperplasia) ?  Mood disorder ([place]) ? ? ? ?Plan: ?Likely has some undiagnosed/untreated dementia and may require the initiation of some medicine to help with the confusion and memory issues  ? ?Family concerned with ability to manage patient at home ?He is not following his NWB status and keeps trying to put  weight on his leg ?Advance diet ?Up with therapy as able ?Incentive Spirometry ?Elevate and Apply ice ? ?Weightbearing: NWB RLE ?Insicional and dressing care: Dressings left intact until follow-up and Reinforce dressings as needed ?Orthopedic device(s): Splint ?Showering: Keep dressing dry ?VTE prophylaxis: Xarelto '15mg'$   daily for A-fib, ok to use for DVT prevention as well, can restart today , SCDs, ambulation ?Pain control: limit narcotics  ?Follow - up plan: 2 weeks ?Contact information:  Edmonia Lynch MD, Aggie Moats PA-C ? ?Dispo: Skilled Nursing Facility/Rehab once bed placement found. His mental status is concerning. ? ? ? ? ?Britt Bottom, PA-C ?Office [phone] ?03/21/2021, 1:04 PM  ?

## 2021-03-21 NOTE — Progress Notes (Signed)
Pt was agitated, confused, attempting to get out of the bed, unable to follow commands. On call provider notified, one does order received for ativan 0.25 mg IV. ?

## 2021-03-22 DIAGNOSIS — N400 Enlarged prostate without lower urinary tract symptoms: Secondary | ICD-10-CM | POA: Diagnosis not present

## 2021-03-22 DIAGNOSIS — S82891A Other fracture of right lower leg, initial encounter for closed fracture: Secondary | ICD-10-CM | POA: Diagnosis not present

## 2021-03-22 DIAGNOSIS — I2510 Atherosclerotic heart disease of native coronary artery without angina pectoris: Secondary | ICD-10-CM | POA: Diagnosis not present

## 2021-03-22 DIAGNOSIS — E119 Type 2 diabetes mellitus without complications: Secondary | ICD-10-CM | POA: Diagnosis not present

## 2021-03-22 LAB — RETICULOCYTES
Immature Retic Fract: 22.1 % — ABNORMAL HIGH (ref 2.3–15.9)
RBC.: 3.69 MIL/uL — ABNORMAL LOW (ref 4.22–5.81)
Retic Count, Absolute: 53.1 10*3/uL (ref 19.0–186.0)
Retic Ct Pct: 1.4 % (ref 0.4–3.1)

## 2021-03-22 LAB — RENAL FUNCTION PANEL
Albumin: 3 g/dL — ABNORMAL LOW (ref 3.5–5.0)
Anion gap: 7 (ref 5–15)
BUN: 13 mg/dL (ref 8–23)
CO2: 27 mmol/L (ref 22–32)
Calcium: 9.1 mg/dL (ref 8.9–10.3)
Chloride: 103 mmol/L (ref 98–111)
Creatinine, Ser: 0.84 mg/dL (ref 0.61–1.24)
GFR, Estimated: >60 mL/min (ref >60)
Glucose, Bld: 115 mg/dL — ABNORMAL HIGH (ref 70–99)
Phosphorus: 2.6 mg/dL (ref 2.5–4.6)
Potassium: 3.7 mmol/L (ref 3.5–5.1)
Sodium: 137 mmol/L (ref 135–145)

## 2021-03-22 LAB — IRON AND TIBC
Iron: 28 ug/dL — ABNORMAL LOW (ref 45–182)
Saturation Ratios: 9 % — ABNORMAL LOW (ref 17.9–39.5)
TIBC: 307 ug/dL (ref 250–450)
UIBC: 279 ug/dL

## 2021-03-22 LAB — CBC
HCT: 31.9 % — ABNORMAL LOW (ref 39.0–52.0)
Hemoglobin: 9.8 g/dL — ABNORMAL LOW (ref 13.0–17.0)
MCH: 26.7 pg (ref 26.0–34.0)
MCHC: 30.7 g/dL (ref 30.0–36.0)
MCV: 86.9 fL (ref 80.0–100.0)
Platelets: 365 10*3/uL (ref 150–400)
RBC: 3.67 MIL/uL — ABNORMAL LOW (ref 4.22–5.81)
RDW: 14.2 % (ref 11.5–15.5)
WBC: 7.7 10*3/uL (ref 4.0–10.5)
nRBC: 0 % (ref 0.0–0.2)

## 2021-03-22 LAB — GLUCOSE, CAPILLARY
Glucose-Capillary: 115 mg/dL — ABNORMAL HIGH (ref 70–99)
Glucose-Capillary: 164 mg/dL — ABNORMAL HIGH (ref 70–99)
Glucose-Capillary: 141 mg/dL — ABNORMAL HIGH (ref 70–99)
Glucose-Capillary: 162 mg/dL — ABNORMAL HIGH (ref 70–99)

## 2021-03-22 LAB — FERRITIN: Ferritin: 108 ng/mL (ref 24–336)

## 2021-03-22 LAB — VITAMIN B12: Vitamin B-12: 1222 pg/mL — ABNORMAL HIGH (ref 180–914)

## 2021-03-22 LAB — MAGNESIUM: Magnesium: 2 mg/dL (ref 1.7–2.4)

## 2021-03-22 LAB — FOLATE: Folate: 34.6 ng/mL (ref >5.9)

## 2021-03-22 MED ORDER — SENNOSIDES-DOCUSATE SODIUM 8.6-50 MG PO TABS: 1.0000 | ORAL_TABLET | Freq: Two times a day (BID) | ORAL | Status: DC | PRN

## 2021-03-22 MED ORDER — HYDRALAZINE HCL 25 MG PO TABS: 25.0000 mg | ORAL_TABLET | Freq: Four times a day (QID) | ORAL | Status: DC | PRN

## 2021-03-22 MED ORDER — FERROUS SULFATE 325 (65 FE) MG PO TABS
325.0000 mg | ORAL_TABLET | Freq: Two times a day (BID) | ORAL | Status: DC
  Administered 2021-03-22 (×2): 325 mg via ORAL
  Filled 2021-03-22 (×2): qty 1

## 2021-03-22 MED ORDER — METHOCARBAMOL 500 MG PO TABS: 500.0000 mg | ORAL_TABLET | Freq: Three times a day (TID) | ORAL | 0 refills | Status: DC | PRN

## 2021-03-22 MED ORDER — ACETAMINOPHEN 500 MG PO TABS: 1000.0000 mg | ORAL_TABLET | Freq: Four times a day (QID) | ORAL | 0 refills | Status: AC | PRN

## 2021-03-22 MED ORDER — CITALOPRAM HYDROBROMIDE 20 MG PO TABS
20.0000 mg | ORAL_TABLET | Freq: Every day | ORAL | Status: DC
Start: 2021-03-22 — End: 2022-11-10

## 2021-03-22 MED ORDER — BENAZEPRIL HCL 40 MG PO TABS: 40.0000 mg | ORAL_TABLET | Freq: Every day | ORAL | Status: DC

## 2021-03-22 MED ORDER — QUETIAPINE FUMARATE 50 MG PO TABS
50.0000 mg | ORAL_TABLET | Freq: Every day | ORAL | Status: DC
  Administered 2021-03-22: 50 mg via ORAL
  Filled 2021-03-22: qty 1

## 2021-03-22 MED ORDER — FERROUS SULFATE 325 (65 FE) MG PO TABS: 325.0000 mg | ORAL_TABLET | Freq: Two times a day (BID) | ORAL | 3 refills | Status: DC

## 2021-03-22 MED ORDER — TRAMADOL HCL 50 MG PO TABS: 50.0000 mg | ORAL_TABLET | Freq: Three times a day (TID) | ORAL | 0 refills | Status: DC | PRN

## 2021-03-22 MED ORDER — MELATONIN 5 MG PO TABS
5.0000 mg | ORAL_TABLET | Freq: Every day | ORAL | Status: DC
  Administered 2021-03-22: 5 mg via ORAL
  Filled 2021-03-22: qty 1

## 2021-03-22 MED ORDER — QUETIAPINE FUMARATE 50 MG PO TABS: 50.0000 mg | ORAL_TABLET | Freq: Every day | ORAL | Status: DC

## 2021-03-22 NOTE — Progress Notes (Signed)
? ? ?Subjective: ?Patient slightly more aware this morning. Able to tell me he fell and broke his ankle and had surgery. Asked me to elevate the foot with a pillow. Still doesn't know where he is. Constant reorientation needed. Unsure of baseline cognition issues vs anesthesia/narcotics causing his current mental state. Required IV Ativan last 2 nights to calm his agitation. ? ?PT sessions are getting slightly better. Still trying to put weight on RLE despite NWB status. Hopefully continued visits will help improve his ability to mobilize and transfer while maintaining WB status but I'm very cautious about his ability to progress. ? ?Objective:  ? ?VITALS:   ?Vitals:  ? 03/21/21 1247 03/21/21 2024 03/21/21 2024 03/22/21 0547  ?BP: (!) 157/77 (!) 158/89 (!) 158/89 (!) 195/95  ?Pulse: 66 71 71 70  ?Resp: '18 16 16 14  '$ ?Temp: 98.1 ?F (36.7 ?C) 97.9 ?F (36.6 ?C) 97.9 ?F (36.6 ?C) 97.8 ?F (36.6 ?C)  ?TempSrc: Oral Oral Oral Oral  ?SpO2: 97% 98% 99% 95%  ?Weight:      ?Height:      ? ?CBC Latest Ref Rng & Units 03/22/2021 03/21/2021 03/20/2021  ?WBC 4.0 - 10.5 K/uL 7.7 8.2 9.3  ?Hemoglobin 13.0 - 17.0 g/dL 9.8(L) 9.6(L) 10.0(L)  ?Hematocrit 39.0 - 52.0 % 31.9(L) 30.9(L) 31.5(L)  ?Platelets 150 - 400 K/uL 365 342 311  ? ?BMP Latest Ref Rng & Units 03/21/2021 03/20/2021 03/14/2021  ?Glucose 70 - 99 mg/dL 75 221(H) 179(H)  ?BUN 8 - 23 mg/dL '14 18 13  '$ ?Creatinine 0.61 - 1.24 mg/dL 0.87 0.80 0.87  ?BUN/Creat Ratio 10 - 24 - - -  ?Sodium 135 - 145 mmol/L 136 137 139  ?Potassium 3.5 - 5.1 mmol/L 3.3(L) 3.9 3.7  ?Chloride 98 - 111 mmol/L 101 102 103  ?CO2 22 - 32 mmol/L '29 26 27  '$ ?Calcium 8.9 - 10.3 mg/dL 9.2 9.7 9.6  ? ?Intake/Output   ?   03/16 0701 ?03/17 0700 03/17 0701 ?03/18 0700  ? P.O. 600   ? IV Piggyback 57.7   ? Total Intake(mL/kg) 657.7 (7.6)   ? Urine (mL/kg/hr) 700 (0.3) 250 (1.8)  ? Total Output 700 250  ? Net -42.3 -250  ?     ? Urine Occurrence 3 x 1 x  ?  ? ? ?Physical Exam: ?General: NAD. Sleeping comfortably. Easily  awoken. Calm ?Resp: No increased wob ?Cardio: regular rate and rhythm ?ABD soft ?Neurologically intact ?MSK ?Neurovascularly intact ?Sensation intact distally ?Intact pulses distally ?Able to wiggle his toes when I asked this time ?Incision: dressing C/D/I ? ? ?Assessment: ?3 Days Post-Op  ?S/P Procedure(s) (LRB): ?OPEN REDUCTION INTERNAL FIXATION (ORIF) ANKLE FRACTURE, COLATERAL ANKLE LIGAMENT REPAIR (Right) ?by Dr. Ernesta Amble. Murphy on 03/19/21 ? ?Principal Problem: ?  Closed right ankle fracture ?Active Problems: ?  Hypothyroidism ?  Type 2 diabetes mellitus without complication, without long-term current use of insulin ([place]) ?  Essential hypertension, benign ?  CAD, NATIVE VESSEL ?  GASTROESOPHAGEAL REFLUX DISEASE, CHRONIC ?  Paroxysmal atrial fibrillation (HCC) ?  Dyslipidemia ?  Hypokalemia ?  Normocytic anemia ?  Confusion ?  Hard of hearing ?  BPH (benign prostatic hyperplasia) ?  Mood disorder ([place]) ? ? ? ?Plan: ?Likely has some undiagnosed/untreated dementia and may require the initiation of some medicine to help with the confusion and memory issues  ? ?Family concerned with ability to manage patient at home ?He is not following his NWB status and keeps trying to put weight on  his leg ?Advance diet ?Up with therapy as able ?Incentive Spirometry ?Elevate and Apply ice ? ?Weightbearing: NWB RLE ?Insicional and dressing care: Dressings left intact until follow-up and Reinforce dressings as needed ?Orthopedic device(s): Splint ?Showering: Keep dressing dry ?VTE prophylaxis: Xarelto '15mg'$   daily for A-fib, ok to use for DVT prevention as well , SCDs, ambulation ?Pain control: limit narcotics  ?Follow - up plan: 2 weeks ?Contact information:  Edmonia Lynch MD, Aggie Moats PA-C ? ?Dispo: Skilled Nursing Facility/Rehab today ? ? ? ? ?Britt Bottom, PA-C ?Office [phone] ?03/22/2021, 8:35 AM  ?

## 2021-03-22 NOTE — Discharge Summary (Signed)
Physician Discharge Summary  ?Patient ID: ?Brett Matthews ?MRN: [id number] ?DOB/AGE: [birth year] 88 y.o. ? ?Admit date: 03/19/2021 ?Discharge date: 03/22/2021 ? ?Admission Diagnoses: right bimalleolar ankle fracture ? ?Discharge Diagnoses:  ?Principal Problem: ?  Closed right ankle fracture ?Active Problems: ?  Hypothyroidism ?  Type 2 diabetes mellitus without complication, without long-term current use of insulin ([place]) ?  Essential hypertension, benign ?  CAD, NATIVE VESSEL ?  GASTROESOPHAGEAL REFLUX DISEASE, CHRONIC ?  Paroxysmal atrial fibrillation (HCC) ?  Dyslipidemia ?  Hypokalemia ?  Normocytic anemia ?  Delirium ?  Hard of hearing ?  BPH (benign prostatic hyperplasia) ?  Mood disorder ([place]) ? ? ?Discharged Condition: fair ? ?Hospital Course: Patient underwent a right ankle ORIF by Dr. Percell Miller on 7/56/43 without complications. He was originally scheduled for [facility] but was not doing well in the recovery area and unable to maintain his NWB status so he had to be admitted for SNF placement. During his time in the hospital, his cognitive state rapidly declined from what his caretakers say his baseline is. He was only aware of his name and nothing else. We decreased all narcotics and he is mainly only getting Tylenol. He has required IV Ativan the last 2 nights for agitation and aggression. Today he is more aware of his surroundings and that he had surgery on his ankle due to an injury. He would be safer at a SNF than going back home where his caretakers felt uncomfortable caring for him.  ? ?Consults:  hospitalist ? ?Significant Diagnostic Studies: n/a ? ?Treatments: IV hydration, antibiotics: Ancef, analgesia: acetaminophen and Tramadol, anticoagulation: xarelto, therapies: PT and OT, and surgery: right ankle ORIF ? ?Discharge Exam: ?Blood pressure (!) 150/74, pulse 68, temperature 98 ?F (36.7 ?C), temperature source Oral, resp. rate 16, height '5\' 11"'$  (1.803 m), weight 86.2 kg, SpO2 94 %. ?General appearance: alert,  appears stated age, delirious, and no distress ?Head: Normocephalic, without obvious abnormality, atraumatic ?Resp: clear to auscultation bilaterally ?Cardio: regular rate and rhythm, S1, S2 normal, no murmur, click, rub or gallop ?GI: soft, non-tender; bowel sounds normal; no masses,  no organomegaly ?Extremities: extremities normal, atraumatic, no cyanosis or edema ?Pulses:  ?L brachial 2+ R brachial 2+  ?L radial 2+ R radial 2+  ?L inguinal 2+ R inguinal 2+  ?L popliteal 2+ R popliteal 2+  ?L posterior tibial 2+ R posterior tibial 2+  ?L dorsalis pedis 2+ R dorsalis pedis 2+  ? ?Incision/Wound: splint intact ? ?Disposition: SNF ? ?Discharge Instructions   ? ? Call MD / Call 911   Complete by: As directed ?  ? If you experience chest pain or shortness of breath, CALL 911 and be transported to the hospital emergency room.  If you develope a fever above 101 F, pus (white drainage) or increased drainage or redness at the wound, or calf pain, call your surgeon's office.  ? Diet - low sodium heart healthy   Complete by: As directed ?  ? Discharge instructions   Complete by: As directed ?  ? It is very important for you to Elevate your leg - Toes above nose as much as possible to reduce pain / swelling. ? ? ?Weight Bearing:  Non weight bearing affected leg. You can use crutches or a knee scooter to help you mobilize.  ? ?Diet: As you were doing prior to hospitalization  ? ?Shower:  You have a splint on, leave the splint in place and keep the splint dry with a plastic  bag. ? ?Dressing:  You have a splint. Leave the splint in place and we will change your bandages during your first follow-up appointment.  You may loosen and re-apply ace wrap if it feels too tight.   ? ?Activity:  Increase activity slowly as tolerated, but follow the weight bearing instructions above.  The rules on driving is that you can not be taking narcotics while you drive, and you must feel in control of the vehicle.   ? ?Medicines:  ?- Tylenol is  for mild to moderate pain relief. ?- Norco/Hydrocodone is a narcotic for severe pain relief. Take this as little as possible and stop it as soon as possible. ?- Baclofen is for muscle spasms. This medicine can make you drowsy. ?- Zofran is for nausea and vomiting. ?- Xarelto is for prevent blood clots after surgery. YOU MUST TAKE THIS MEDICINE!! ? ?To prevent constipation:  ?Narcotic medicines cause constipation.  Wean these as soon as is appropriate.   ?You may use a stool softener such as - ? ?Colace (over the counter) 100 mg by mouth twice a day  ?Drink plenty of fluids (prune juice may be helpful) and high fiber foods ?Miralax (over the counter) for constipation as needed.   ? ?Itching:  If you experience itching with your medications, try taking only a single pain pill, or even half a pain pill at a time.  You can also use benadryl over the counter for itching or also to help with sleep.  ? ?Precautions:  If you experience chest pain or shortness of breath - call 911 immediately for transfer to the hospital emergency department!! ? ?If you develop a fever greater that 101 F, purulent drainage from wound, increased redness or drainage from wound, or calf pain -- Call the office at [phone]       ?                                          ?Follow- Up Appointment:  Please call for an appointment to be seen in 1-2 weeks - [phone]  ? Driving restrictions   Complete by: As directed ?  ? No driving for 4 weeks  ? Non weight bearing   Complete by: As directed ?  ? Post-operative opioid taper instructions:   Complete by: As directed ?  ? POST-OPERATIVE OPIOID TAPER INSTRUCTIONS: ?It is important to wean off of your opioid medication as soon as possible. If you do not need pain medication after your surgery it is ok to stop day one. ?Opioids include: ?Codeine, Hydrocodone(Norco, Vicodin), Oxycodone(Percocet, oxycontin) and hydromorphone amongst others.  ?Long term and even short term use of opiods can  cause: ?Increased pain response ?Dependence ?Constipation ?Depression ?Respiratory depression ?And more.  ?Withdrawal symptoms can include ?Flu like symptoms ?Nausea, vomiting ?And more ?Techniques to manage these symptoms ?Hydrate well ?Eat regular healthy meals ?Stay active ?Use relaxation techniques(deep breathing, meditating, yoga) ?Do Not substitute Alcohol to help with tapering ?If you have been on opioids for less than two weeks and do not have pain than it is ok to stop all together.  ?Plan to wean off of opioids ?This plan should start within one week post op of your joint replacement. ?Maintain the same interval or time between taking each dose and first decrease the dose.  ?Cut the total daily intake of opioids by one tablet each day ?  Next start to increase the time between doses. ?The last dose that should be eliminated is the evening dose.  ? ?  ? ?  ? ?Allergies as of 03/22/2021   ?No Known Allergies ?  ? ?  ?Medication List  ?  ? ?STOP taking these medications   ? ?clonazePAM 1 MG tablet ?Commonly known as: KLONOPIN ?  ?oxyCODONE-acetaminophen 5-325 MG tablet ?Commonly known as: Percocet ?  ? ?  ? ?TAKE these medications   ? ?acetaminophen 500 MG tablet ?Commonly known as: TYLENOL ?Take 2 tablets (1,000 mg total) by mouth every 6 (six) hours as needed for mild pain or moderate pain. ?What changed:  ?when to take this ?reasons to take this ?  ?albuterol 108 (90 Base) MCG/ACT inhaler ?Commonly known as: VENTOLIN HFA ?Inhale 1-2 puffs into the lungs every 6 (six) hours as needed for wheezing or shortness of breath. ?  ?atorvastatin 10 MG tablet ?Commonly known as: LIPITOR ?Take 1 tablet (10 mg total) by mouth daily. ?  ?benazepril 40 MG tablet ?Commonly known as: LOTENSIN ?Take 1 tablet (40 mg total) by mouth daily. ?Start taking on: March 23, 2021 ?What changed:  ?medication strength ?how much to take ?  ?carvedilol 12.5 MG tablet ?Commonly known as: COREG ?Take 12.5 mg by mouth 2 (two) times daily with  a meal. ?  ?citalopram 20 MG tablet ?Commonly known as: CELEXA ?Take 1 tablet (20 mg total) by mouth at bedtime. ?What changed:  ?medication strength ?how much to take ?when to take this ?  ?D3-1000 25 MCG (100

## 2021-03-22 NOTE — Progress Notes (Signed)
?PROGRESS NOTE ? ?Brett Matthews:[id number] DOB: 1932-12-05  ? ?PCP: Glenda Chroman, MD ? ?Patient is from: Home ? ?DOA: 03/19/2021 LOS: 2 ? ?Chief complaints ?No chief complaint on file. ?  ? ?Brief Narrative / Interim history: ?88 year old M with PMH of CAD, paroxysmal A-fib, DM-2, HTN, GERD, diminished hearing, hypothyroidism, anemia, hemorrhoids and diverticulosis admitted by orthopedic surgery for right ankle fracture.  Underwent ORIF on 03/19/2021.  Hospital service consulted for assistance with chronic medical condition including uncontrolled hypertension and diabetes. ?Patient seems to have hospital delirium worse at night  ? ?Subjective: ?Seen and examined earlier this morning.  He had an episode of agitation and confusion requiring IV Ativan last night.  Reportedly, he was combative with staff.  He looks calm this morning but only oriented to self.  Has no complaints.  ? ?Objective: ?Vitals:  ? 03/21/21 2024 03/21/21 2024 03/22/21 0547 03/22/21 1000  ?BP: (!) 158/89 (!) 158/89 (!) 195/95 (!) 150/74  ?Pulse: 71 71 70 68  ?Resp: '16 16 14 16  '$ ?Temp: 97.9 ?F (36.6 ?C) 97.9 ?F (36.6 ?C) 97.8 ?F (36.6 ?C) 98 ?F (36.7 ?C)  ?TempSrc: Oral Oral Oral Oral  ?SpO2: 98% 99% 95% 94%  ?Weight:      ?Height:      ? ? ?Examination: ? ? ?GENERAL: No apparent distress.  Nontoxic. ?HEENT: MMM.  Vision grossly intact.  Hard of hearing. ?NECK: Supple.  No apparent JVD.  ?RESP:  No IWOB.  Fair aeration bilaterally. ?CVS:  RRR. Heart sounds normal.  ?ABD/GI/GU: BS+. Abd soft, NTND.  ?MSK/EXT:  Moves extremities.  Bulky dressing over RLE.  No swelling or erythema proximally or distally ?SKIN: no apparent skin lesion or wound ?NEURO: Awake.  Oriented to self.  No apparent focal neuro deficit. ?PSYCH: Calm. Normal affect.  ? ?Procedures:  ?3/14-ORIF of right ankle fracture ? ?Microbiology summarized: ?COVID-19 and influenza PCR nonreactive. ? ?Assessment and Plan: ?* Closed right ankle fracture ?-S/p ORIF on 03/19/2021. ?-Management  per primary team ?-Adjusted pain medications given confusion and delirium ?-Bowel regimen to avoid constipation ?-Already on Xarelto ?-Therapy recommended SNF. ? ?Delirium ?Multifactorial including hospital delirium, polypharmacy, hard of hearing and underlying cognitive impairment.  Seems to have intermittent confusion at baseline per family.  Was agitated overnight requiring IV Ativan.  He is calm this morning but only oriented to self. ?-Reorientation, delirium and fall precautions. ?-Minimize sedating medications-adjusted pain medications ?-Increase Seroquel to 50 mg at night ? ?Essential hypertension, benign ?BP remains elevated but improved.  BP unreliable at times due to agitation ?-Increased home benazepril to 40 mg daily on 3/15 ?-Continue home Coreg and Cardizem CD ?-P.o. hydralazine 25 mg as needed with parameters ?-Pain control ? ?Type 2 diabetes mellitus without complication, without long-term current use of insulin ([place]) ?Controlled with hyperglycemia.  A1c 6.1%. ?Recent Labs  ?Lab 03/21/21 ?0904 03/21/21 ?1156 03/21/21 ?1556 03/21/21 ?2028 03/22/21 ?0745  ?GLUCAP 82 218* 204* 146* 115*  ?-Continue basal insulin at 10 units twice daily ?-Continue SSI-moderate ?-Hold home metformin and glimepiride while in-house. ?-Further adjustment as appropriate ? ? ?Mood disorder ([place]) ?Decreased his Celexa to 20 mg daily given his age ? ?Paroxysmal atrial fibrillation (HCC) ?Rate controlled. ?-Continue home Coreg, Cardizem CD and Xarelto ? ?BPH (benign prostatic hyperplasia) ?Continue home Flomax ?Monitor urine output ? ?Hard of hearing ?Unfortunately, his hearing aid is not working ? ?Normocytic anemia ?Recent Labs  ?  01/22/21 ?1725 03/14/21 ?0000 03/20/21 ?3154 03/21/21 ?0086 03/22/21 ?7619  ?HGB 12.7* 11.0*  10.0* 9.6* 9.8*  ?H&H seems to be stable after initial drop.  Anemia panel with some degree of iron deficiency ?-P.o. ferrous sulfate ? ?Hypokalemia ?Resolved ? ?Dyslipidemia ?Continue home  Lipitor ? ?GASTROESOPHAGEAL REFLUX DISEASE, CHRONIC ?Continue home PPI ? ?CAD, NATIVE VESSEL ?Continue home Coreg ? ?Hypothyroidism ?Continue home Synthroid ? ? ? ? ?  ? ?Body mass index is 26.5 kg/m?. ?  ?  ?  ?  ?DVT prophylaxis:  ?SCDs Start: 03/19/21 1959 ?Rivaroxaban (XARELTO) tablet 15 mg  ?Code Status: Full code ?Family Communication: Updated patient's friend and significant other over the phone on 3/15. ?Level of care: Med-Surg ? ?Final disposition: Per primary.  ? ? ?Sch Meds:  ?Scheduled Meds: ? acetaminophen  1,000 mg Oral Q8H  ? atorvastatin  10 mg Oral QHS  ? benazepril  40 mg Oral Daily  ? carvedilol  12.5 mg Oral BID WC  ? cholecalciferol  1,000 Units Oral QPM  ? citalopram  20 mg Oral QHS  ? diltiazem  240 mg Oral QHS  ? docusate sodium  100 mg Oral BID  ? famotidine  40 mg Oral QHS  ? ferrous sulfate  325 mg Oral BID WC  ? folic acid  1 mg Oral [id number]  ? gabapentin  300 mg Oral TID  ? insulin aspart  0-15 Units Subcutaneous TID WC  ? insulin aspart  0-5 Units Subcutaneous QHS  ? insulin glargine-yfgn  10 Units Subcutaneous BID  ? levothyroxine  125 mcg Oral Q0600  ? melatonin  5 mg Oral QHS  ? pantoprazole  40 mg Oral Q1200  ? QUEtiapine  50 mg Oral QHS  ? Rivaroxaban  15 mg Oral QPM  ? tamsulosin  0.4 mg Oral QPC breakfast  ? vitamin B-12  1,000 mcg Oral Daily  ? zinc sulfate  220 mg Oral Daily  ? ?Continuous Infusions: ? ? ? ?PRN Meds:.acetaminophen, albuterol, hydrALAZINE, methocarbamol, metoCLOPramide **OR** metoCLOPramide (REGLAN) injection, ondansetron **OR** ondansetron (ZOFRAN) IV, polyethylene glycol, senna-docusate, traMADol ? ?Antimicrobials: ?Anti-infectives (From admission, onward)  ? ? Start     Dose/Rate Route Frequency Ordered Stop  ? 03/19/21 0930  ceFAZolin (ANCEF) IVPB 2g/100 mL premix       ? 2 g ?200 mL/hr over 30 Minutes Intravenous On call to O.R. 03/19/21 0928 03/19/21 1335  ? ?  ? ? ? ?I have personally reviewed the following labs and images: ?CBC: ?Recent Labs  ?Lab  03/20/21 ?8250 03/21/21 ?5397 03/22/21 ?6734  ?WBC 9.3 8.2 7.7  ?HGB 10.0* 9.6* 9.8*  ?HCT 31.5* 30.9* 31.9*  ?MCV 84.7 85.8 86.9  ?PLT 311 342 365  ? ?BMP &GFR ?Recent Labs  ?Lab 03/20/21 ?1937 03/21/21 ?9024 03/21/21 ?0973 03/22/21 ?5329 03/22/21 ?9242  ?NA 137  --  136  --  137  ?K 3.9  --  3.3*  --  3.7  ?CL 102  --  101  --  103  ?CO2 26  --  29  --  27  ?GLUCOSE 221*  --  75  --  115*  ?BUN 18  --  14  --  13  ?CREATININE 0.80  --  0.87  --  0.84  ?CALCIUM 9.7  --  9.2  --  9.1  ?MG  --  1.7  --  2.0  --   ?PHOS  --   --  3.0  --  2.6  ? ?Estimated Creatinine Clearance: 67.2 mL/min (by C-G formula based on SCr of 0.84 mg/dL). ?Liver & Pancreas: ?Recent  Labs  ?Lab 03/20/21 ?3419 03/21/21 ?6222 03/22/21 ?9798  ?AST 13*  --   --   ?ALT 12  --   --   ?ALKPHOS 93  --   --   ?BILITOT 0.4  --   --   ?PROT 6.0*  --   --   ?ALBUMIN 3.1* 3.0* 3.0*  ? ?No results for input(s): LIPASE, AMYLASE in the last 168 hours. ?No results for input(s): AMMONIA in the last 168 hours. ?Diabetic: ?No results for input(s): HGBA1C in the last 72 hours. ? ?Recent Labs  ?Lab 03/21/21 ?0904 03/21/21 ?1156 03/21/21 ?1556 03/21/21 ?2028 03/22/21 ?0745  ?GLUCAP 82 218* 204* 146* 115*  ? ?Cardiac Enzymes: ?No results for input(s): CKTOTAL, CKMB, CKMBINDEX, TROPONINI in the last 168 hours. ?No results for input(s): PROBNP in the last 8760 hours. ?Coagulation Profile: ?No results for input(s): INR, PROTIME in the last 168 hours. ?Thyroid Function Tests: ?No results for input(s): TSH, T4TOTAL, FREET4, T3FREE, THYROIDAB in the last 72 hours. ?Lipid Profile: ?No results for input(s): CHOL, HDL, LDLCALC, TRIG, CHOLHDL, LDLDIRECT in the last 72 hours. ?Anemia Panel: ?Recent Labs  ?  03/21/21 ?0923 03/22/21 ?9211  ?HERDEYCX44 8,185* 1,222*  ?FOLATE 34.1  --   ?FERRITIN 131 108  ?TIBC 328 307  ?IRON 30* 28*  ?RETICCTPCT 1.6 1.4  ? ?Urine analysis: ?   ?Component Value Date/Time  ? COLORURINE YELLOW 01/22/2021 1801  ? APPEARANCEUR CLEAR 01/22/2021 1801   ? LABSPEC 1.015 01/22/2021 1801  ? PHURINE 7.5 01/22/2021 1801  ? GLUCOSEU NEGATIVE 01/22/2021 1801  ? [place] NEGATIVE 01/22/2021 1801  ? [place] NEGATIVE 01/22/2021 1801  ? [place] NEGATIVE 01/22/2021 1801  ? PROTEINUR

## 2021-03-22 NOTE — TOC Transition Note (Signed)
Transition of Care (TOC) - CM/SW Discharge Note ? ? ?Patient Details  ?Name: Brett Matthews ?MRN: [id number] ?Date of Birth: 1932/04/25 ? ?Transition of Care (TOC) CM/SW Contact:  ?Haven Foss, Marjie Skiff, RN ?Phone Number: ?03/22/2021, 2:59 PM ? ? ?Clinical Narrative:    ?Pt to dc to [facility] room B01-4. RN to call report to [phone]. [facility] called for transport. Pt girlfriend Raford Pitcher called to make aware of dc. ? ? ?Final next level of care: [place] ?Barriers to Discharge: Continued Medical Work up ?  ? ?Discharge Plan and Services ?  ?Discharge Planning Services: CM Consult ?           ?  ?Readmission Risk Interventions ?No flowsheet data found. ? ? ? ? ?

## 2021-03-23 DIAGNOSIS — I480 Paroxysmal atrial fibrillation: Secondary | ICD-10-CM | POA: Diagnosis not present

## 2021-03-23 DIAGNOSIS — I2510 Atherosclerotic heart disease of native coronary artery without angina pectoris: Secondary | ICD-10-CM | POA: Diagnosis not present

## 2021-03-23 DIAGNOSIS — R531 Weakness: Secondary | ICD-10-CM | POA: Diagnosis not present

## 2021-03-23 DIAGNOSIS — R262 Difficulty in walking, not elsewhere classified: Secondary | ICD-10-CM | POA: Diagnosis not present

## 2021-03-23 DIAGNOSIS — Z743 Need for continuous supervision: Secondary | ICD-10-CM | POA: Diagnosis not present

## 2021-03-23 DIAGNOSIS — R2689 Other abnormalities of gait and mobility: Secondary | ICD-10-CM | POA: Diagnosis not present

## 2021-03-23 DIAGNOSIS — S82841D Displaced bimalleolar fracture of right lower leg, subsequent encounter for closed fracture with routine healing: Secondary | ICD-10-CM | POA: Diagnosis not present

## 2021-03-23 DIAGNOSIS — E039 Hypothyroidism, unspecified: Secondary | ICD-10-CM | POA: Diagnosis not present

## 2021-03-23 DIAGNOSIS — M6281 Muscle weakness (generalized): Secondary | ICD-10-CM | POA: Diagnosis not present

## 2021-03-23 NOTE — Progress Notes (Signed)
[facility] here for transport to [place].  Pt requested PRN Tramadol for ankle pain prior to leaving, same provided.  VSS upon departure.  All belongings sent with pt including 2 hearing aides and charger/cord.  Discharge paperwork given to [facility] attendants. ? ? ? 03/23/21 0338  ?Vitals  ?Temp 97.9 ?F (36.6 ?C)  ?Temp Source Oral  ?BP (!) 157/90  ?MAP (mmHg) 105  ?BP Location Left Arm  ?BP Method Automatic  ?Patient Position (if appropriate) Sitting  ?Pulse Rate 72  ?Pulse Rate Source Monitor  ?Resp 18  ?Level of Consciousness  ?Level of Consciousness Alert  ?MEWS COLOR  ?MEWS Score Color Green  ?Oxygen Therapy  ?SpO2 95 %  ?O2 Device Room Air  ?Pain Assessment  ?Pain Scale 0-10  ?Pain Score 6  ?Pain Location Ankle  ?Pain Orientation Right  ?Pain Intervention(s) Medication (See eMAR);Emotional support  ?POSS Scale ([place] Opioid Sedation Scale)  ?POSS *See Group Information* 1-Acceptable,Awake and alert  ?PCA/Epidural/Spinal Assessment  ?Respiratory Pattern Regular;Unlabored;Symmetrical  ?MEWS Score  ?MEWS Temp 0  ?MEWS Systolic 0  ?MEWS Pulse 0  ?MEWS RR 0  ?MEWS LOC 0  ?MEWS Score 0  ? ? ?Ayesha Mohair BSN RN CMSRN ?03/23/2021, 3:46 AM ? ?

## 2021-03-27 ENCOUNTER — Encounter (HOSPITAL_COMMUNITY): Payer: Self-pay | Admitting: Orthopedic Surgery

## 2021-03-28 DIAGNOSIS — I10 Essential (primary) hypertension: Secondary | ICD-10-CM | POA: Diagnosis not present

## 2021-03-28 DIAGNOSIS — Z299 Encounter for prophylactic measures, unspecified: Secondary | ICD-10-CM | POA: Diagnosis not present

## 2021-03-28 DIAGNOSIS — Z7901 Long term (current) use of anticoagulants: Secondary | ICD-10-CM | POA: Diagnosis not present

## 2021-03-28 DIAGNOSIS — J069 Acute upper respiratory infection, unspecified: Secondary | ICD-10-CM | POA: Diagnosis not present

## 2021-03-28 DIAGNOSIS — M170 Bilateral primary osteoarthritis of knee: Secondary | ICD-10-CM | POA: Diagnosis not present

## 2021-03-28 DIAGNOSIS — Z515 Encounter for palliative care: Secondary | ICD-10-CM | POA: Diagnosis not present

## 2021-03-28 DIAGNOSIS — W1789XD Other fall from one level to another, subsequent encounter: Secondary | ICD-10-CM | POA: Diagnosis not present

## 2021-03-28 DIAGNOSIS — G629 Polyneuropathy, unspecified: Secondary | ICD-10-CM | POA: Diagnosis not present

## 2021-03-28 DIAGNOSIS — E1142 Type 2 diabetes mellitus with diabetic polyneuropathy: Secondary | ICD-10-CM | POA: Diagnosis not present

## 2021-03-28 DIAGNOSIS — S82841D Displaced bimalleolar fracture of right lower leg, subsequent encounter for closed fracture with routine healing: Secondary | ICD-10-CM | POA: Diagnosis not present

## 2021-03-28 DIAGNOSIS — D692 Other nonthrombocytopenic purpura: Secondary | ICD-10-CM | POA: Diagnosis not present

## 2021-03-28 DIAGNOSIS — R531 Weakness: Secondary | ICD-10-CM | POA: Diagnosis not present

## 2021-03-29 DIAGNOSIS — R262 Difficulty in walking, not elsewhere classified: Secondary | ICD-10-CM | POA: Diagnosis not present

## 2021-03-29 DIAGNOSIS — I480 Paroxysmal atrial fibrillation: Secondary | ICD-10-CM | POA: Diagnosis not present

## 2021-03-29 DIAGNOSIS — S82841D Displaced bimalleolar fracture of right lower leg, subsequent encounter for closed fracture with routine healing: Secondary | ICD-10-CM | POA: Diagnosis not present

## 2021-03-29 DIAGNOSIS — E785 Hyperlipidemia, unspecified: Secondary | ICD-10-CM | POA: Diagnosis not present

## 2021-03-29 DIAGNOSIS — N400 Enlarged prostate without lower urinary tract symptoms: Secondary | ICD-10-CM | POA: Diagnosis not present

## 2021-03-29 DIAGNOSIS — I10 Essential (primary) hypertension: Secondary | ICD-10-CM | POA: Diagnosis not present

## 2021-03-29 DIAGNOSIS — D649 Anemia, unspecified: Secondary | ICD-10-CM | POA: Diagnosis not present

## 2021-03-29 DIAGNOSIS — E039 Hypothyroidism, unspecified: Secondary | ICD-10-CM | POA: Diagnosis not present

## 2021-03-29 DIAGNOSIS — S82891A Other fracture of right lower leg, initial encounter for closed fracture: Secondary | ICD-10-CM | POA: Diagnosis not present

## 2021-03-29 DIAGNOSIS — F39 Unspecified mood [affective] disorder: Secondary | ICD-10-CM | POA: Diagnosis not present

## 2021-03-29 DIAGNOSIS — K219 Gastro-esophageal reflux disease without esophagitis: Secondary | ICD-10-CM | POA: Diagnosis not present

## 2021-03-29 DIAGNOSIS — M6281 Muscle weakness (generalized): Secondary | ICD-10-CM | POA: Diagnosis not present

## 2021-03-29 DIAGNOSIS — E119 Type 2 diabetes mellitus without complications: Secondary | ICD-10-CM | POA: Diagnosis not present

## 2021-03-29 DIAGNOSIS — I2510 Atherosclerotic heart disease of native coronary artery without angina pectoris: Secondary | ICD-10-CM | POA: Diagnosis not present

## 2021-03-29 DIAGNOSIS — R2689 Other abnormalities of gait and mobility: Secondary | ICD-10-CM | POA: Diagnosis not present

## 2021-03-29 DIAGNOSIS — E876 Hypokalemia: Secondary | ICD-10-CM | POA: Diagnosis not present

## 2021-04-08 DIAGNOSIS — S82841D Displaced bimalleolar fracture of right lower leg, subsequent encounter for closed fracture with routine healing: Secondary | ICD-10-CM | POA: Diagnosis not present

## 2021-04-13 DIAGNOSIS — I10 Essential (primary) hypertension: Secondary | ICD-10-CM | POA: Diagnosis not present

## 2021-04-22 DIAGNOSIS — I2510 Atherosclerotic heart disease of native coronary artery without angina pectoris: Secondary | ICD-10-CM | POA: Diagnosis not present

## 2021-04-22 DIAGNOSIS — F39 Unspecified mood [affective] disorder: Secondary | ICD-10-CM | POA: Diagnosis not present

## 2021-04-22 DIAGNOSIS — E119 Type 2 diabetes mellitus without complications: Secondary | ICD-10-CM | POA: Diagnosis not present

## 2021-05-01 DIAGNOSIS — S82841D Displaced bimalleolar fracture of right lower leg, subsequent encounter for closed fracture with routine healing: Secondary | ICD-10-CM | POA: Diagnosis not present

## 2021-05-03 DIAGNOSIS — I2510 Atherosclerotic heart disease of native coronary artery without angina pectoris: Secondary | ICD-10-CM | POA: Diagnosis not present

## 2021-05-03 DIAGNOSIS — E119 Type 2 diabetes mellitus without complications: Secondary | ICD-10-CM | POA: Diagnosis not present

## 2021-05-06 DIAGNOSIS — G43909 Migraine, unspecified, not intractable, without status migrainosus: Secondary | ICD-10-CM | POA: Diagnosis not present

## 2021-05-06 DIAGNOSIS — J302 Other seasonal allergic rhinitis: Secondary | ICD-10-CM | POA: Diagnosis not present

## 2021-05-06 DIAGNOSIS — N400 Enlarged prostate without lower urinary tract symptoms: Secondary | ICD-10-CM | POA: Diagnosis not present

## 2021-05-06 DIAGNOSIS — E119 Type 2 diabetes mellitus without complications: Secondary | ICD-10-CM | POA: Diagnosis not present

## 2021-05-06 DIAGNOSIS — S82841D Displaced bimalleolar fracture of right lower leg, subsequent encounter for closed fracture with routine healing: Secondary | ICD-10-CM | POA: Diagnosis not present

## 2021-05-06 DIAGNOSIS — Z9181 History of falling: Secondary | ICD-10-CM | POA: Diagnosis not present

## 2021-05-06 DIAGNOSIS — D509 Iron deficiency anemia, unspecified: Secondary | ICD-10-CM | POA: Diagnosis not present

## 2021-05-06 DIAGNOSIS — I10 Essential (primary) hypertension: Secondary | ICD-10-CM | POA: Diagnosis not present

## 2021-05-06 DIAGNOSIS — K219 Gastro-esophageal reflux disease without esophagitis: Secondary | ICD-10-CM | POA: Diagnosis not present

## 2021-05-06 DIAGNOSIS — Z8616 Personal history of COVID-19: Secondary | ICD-10-CM | POA: Diagnosis not present

## 2021-05-06 DIAGNOSIS — G4700 Insomnia, unspecified: Secondary | ICD-10-CM | POA: Diagnosis not present

## 2021-05-06 DIAGNOSIS — I480 Paroxysmal atrial fibrillation: Secondary | ICD-10-CM | POA: Diagnosis not present

## 2021-05-06 DIAGNOSIS — E039 Hypothyroidism, unspecified: Secondary | ICD-10-CM | POA: Diagnosis not present

## 2021-05-06 DIAGNOSIS — K5900 Constipation, unspecified: Secondary | ICD-10-CM | POA: Diagnosis not present

## 2021-05-06 DIAGNOSIS — E785 Hyperlipidemia, unspecified: Secondary | ICD-10-CM | POA: Diagnosis not present

## 2021-05-06 DIAGNOSIS — F39 Unspecified mood [affective] disorder: Secondary | ICD-10-CM | POA: Diagnosis not present

## 2021-05-06 DIAGNOSIS — I2510 Atherosclerotic heart disease of native coronary artery without angina pectoris: Secondary | ICD-10-CM | POA: Diagnosis not present

## 2021-05-06 DIAGNOSIS — S82431D Displaced oblique fracture of shaft of right fibula, subsequent encounter for closed fracture with routine healing: Secondary | ICD-10-CM | POA: Diagnosis not present

## 2021-05-09 DIAGNOSIS — L84 Corns and callosities: Secondary | ICD-10-CM | POA: Diagnosis not present

## 2021-05-09 DIAGNOSIS — Z09 Encounter for follow-up examination after completed treatment for conditions other than malignant neoplasm: Secondary | ICD-10-CM | POA: Diagnosis not present

## 2021-05-09 DIAGNOSIS — E1142 Type 2 diabetes mellitus with diabetic polyneuropathy: Secondary | ICD-10-CM | POA: Diagnosis not present

## 2021-05-09 DIAGNOSIS — D692 Other nonthrombocytopenic purpura: Secondary | ICD-10-CM | POA: Diagnosis not present

## 2021-05-09 DIAGNOSIS — M79671 Pain in right foot: Secondary | ICD-10-CM | POA: Diagnosis not present

## 2021-05-09 DIAGNOSIS — Z299 Encounter for prophylactic measures, unspecified: Secondary | ICD-10-CM | POA: Diagnosis not present

## 2021-05-09 DIAGNOSIS — B351 Tinea unguium: Secondary | ICD-10-CM | POA: Diagnosis not present

## 2021-05-09 DIAGNOSIS — M545 Low back pain, unspecified: Secondary | ICD-10-CM | POA: Diagnosis not present

## 2021-05-09 DIAGNOSIS — M79676 Pain in unspecified toe(s): Secondary | ICD-10-CM | POA: Diagnosis not present

## 2021-05-13 DIAGNOSIS — J302 Other seasonal allergic rhinitis: Secondary | ICD-10-CM | POA: Diagnosis not present

## 2021-05-13 DIAGNOSIS — F39 Unspecified mood [affective] disorder: Secondary | ICD-10-CM | POA: Diagnosis not present

## 2021-05-13 DIAGNOSIS — N400 Enlarged prostate without lower urinary tract symptoms: Secondary | ICD-10-CM | POA: Diagnosis not present

## 2021-05-13 DIAGNOSIS — I480 Paroxysmal atrial fibrillation: Secondary | ICD-10-CM | POA: Diagnosis not present

## 2021-05-13 DIAGNOSIS — K219 Gastro-esophageal reflux disease without esophagitis: Secondary | ICD-10-CM | POA: Diagnosis not present

## 2021-05-13 DIAGNOSIS — I2510 Atherosclerotic heart disease of native coronary artery without angina pectoris: Secondary | ICD-10-CM | POA: Diagnosis not present

## 2021-05-13 DIAGNOSIS — D509 Iron deficiency anemia, unspecified: Secondary | ICD-10-CM | POA: Diagnosis not present

## 2021-05-13 DIAGNOSIS — E119 Type 2 diabetes mellitus without complications: Secondary | ICD-10-CM | POA: Diagnosis not present

## 2021-05-13 DIAGNOSIS — G43909 Migraine, unspecified, not intractable, without status migrainosus: Secondary | ICD-10-CM | POA: Diagnosis not present

## 2021-05-13 DIAGNOSIS — Z8616 Personal history of COVID-19: Secondary | ICD-10-CM | POA: Diagnosis not present

## 2021-05-13 DIAGNOSIS — E785 Hyperlipidemia, unspecified: Secondary | ICD-10-CM | POA: Diagnosis not present

## 2021-05-13 DIAGNOSIS — S82431D Displaced oblique fracture of shaft of right fibula, subsequent encounter for closed fracture with routine healing: Secondary | ICD-10-CM | POA: Diagnosis not present

## 2021-05-13 DIAGNOSIS — I10 Essential (primary) hypertension: Secondary | ICD-10-CM | POA: Diagnosis not present

## 2021-05-13 DIAGNOSIS — S82841D Displaced bimalleolar fracture of right lower leg, subsequent encounter for closed fracture with routine healing: Secondary | ICD-10-CM | POA: Diagnosis not present

## 2021-05-13 DIAGNOSIS — G4700 Insomnia, unspecified: Secondary | ICD-10-CM | POA: Diagnosis not present

## 2021-05-13 DIAGNOSIS — Z9181 History of falling: Secondary | ICD-10-CM | POA: Diagnosis not present

## 2021-05-13 DIAGNOSIS — K5900 Constipation, unspecified: Secondary | ICD-10-CM | POA: Diagnosis not present

## 2021-05-13 DIAGNOSIS — E039 Hypothyroidism, unspecified: Secondary | ICD-10-CM | POA: Diagnosis not present

## 2021-05-14 DIAGNOSIS — Z8616 Personal history of COVID-19: Secondary | ICD-10-CM | POA: Diagnosis not present

## 2021-05-14 DIAGNOSIS — N400 Enlarged prostate without lower urinary tract symptoms: Secondary | ICD-10-CM | POA: Diagnosis not present

## 2021-05-14 DIAGNOSIS — I2510 Atherosclerotic heart disease of native coronary artery without angina pectoris: Secondary | ICD-10-CM | POA: Diagnosis not present

## 2021-05-14 DIAGNOSIS — I10 Essential (primary) hypertension: Secondary | ICD-10-CM | POA: Diagnosis not present

## 2021-05-14 DIAGNOSIS — K219 Gastro-esophageal reflux disease without esophagitis: Secondary | ICD-10-CM | POA: Diagnosis not present

## 2021-05-14 DIAGNOSIS — Z9181 History of falling: Secondary | ICD-10-CM | POA: Diagnosis not present

## 2021-05-14 DIAGNOSIS — D509 Iron deficiency anemia, unspecified: Secondary | ICD-10-CM | POA: Diagnosis not present

## 2021-05-14 DIAGNOSIS — G4700 Insomnia, unspecified: Secondary | ICD-10-CM | POA: Diagnosis not present

## 2021-05-14 DIAGNOSIS — S82841D Displaced bimalleolar fracture of right lower leg, subsequent encounter for closed fracture with routine healing: Secondary | ICD-10-CM | POA: Diagnosis not present

## 2021-05-14 DIAGNOSIS — K5900 Constipation, unspecified: Secondary | ICD-10-CM | POA: Diagnosis not present

## 2021-05-14 DIAGNOSIS — E785 Hyperlipidemia, unspecified: Secondary | ICD-10-CM | POA: Diagnosis not present

## 2021-05-14 DIAGNOSIS — S82431D Displaced oblique fracture of shaft of right fibula, subsequent encounter for closed fracture with routine healing: Secondary | ICD-10-CM | POA: Diagnosis not present

## 2021-05-14 DIAGNOSIS — I480 Paroxysmal atrial fibrillation: Secondary | ICD-10-CM | POA: Diagnosis not present

## 2021-05-14 DIAGNOSIS — J302 Other seasonal allergic rhinitis: Secondary | ICD-10-CM | POA: Diagnosis not present

## 2021-05-14 DIAGNOSIS — F39 Unspecified mood [affective] disorder: Secondary | ICD-10-CM | POA: Diagnosis not present

## 2021-05-14 DIAGNOSIS — G43909 Migraine, unspecified, not intractable, without status migrainosus: Secondary | ICD-10-CM | POA: Diagnosis not present

## 2021-05-14 DIAGNOSIS — E119 Type 2 diabetes mellitus without complications: Secondary | ICD-10-CM | POA: Diagnosis not present

## 2021-05-14 DIAGNOSIS — E039 Hypothyroidism, unspecified: Secondary | ICD-10-CM | POA: Diagnosis not present

## 2021-05-20 DIAGNOSIS — E785 Hyperlipidemia, unspecified: Secondary | ICD-10-CM | POA: Diagnosis not present

## 2021-05-20 DIAGNOSIS — K219 Gastro-esophageal reflux disease without esophagitis: Secondary | ICD-10-CM | POA: Diagnosis not present

## 2021-05-20 DIAGNOSIS — Z7984 Long term (current) use of oral hypoglycemic drugs: Secondary | ICD-10-CM | POA: Diagnosis not present

## 2021-05-20 DIAGNOSIS — F32A Depression, unspecified: Secondary | ICD-10-CM | POA: Diagnosis not present

## 2021-05-20 DIAGNOSIS — Z886 Allergy status to analgesic agent status: Secondary | ICD-10-CM | POA: Diagnosis not present

## 2021-05-20 DIAGNOSIS — I10 Essential (primary) hypertension: Secondary | ICD-10-CM | POA: Diagnosis not present

## 2021-05-20 DIAGNOSIS — E162 Hypoglycemia, unspecified: Secondary | ICD-10-CM | POA: Diagnosis not present

## 2021-05-20 DIAGNOSIS — N400 Enlarged prostate without lower urinary tract symptoms: Secondary | ICD-10-CM | POA: Diagnosis not present

## 2021-05-20 DIAGNOSIS — S82841D Displaced bimalleolar fracture of right lower leg, subsequent encounter for closed fracture with routine healing: Secondary | ICD-10-CM | POA: Diagnosis not present

## 2021-05-20 DIAGNOSIS — E119 Type 2 diabetes mellitus without complications: Secondary | ICD-10-CM | POA: Diagnosis not present

## 2021-05-20 DIAGNOSIS — G4700 Insomnia, unspecified: Secondary | ICD-10-CM | POA: Diagnosis not present

## 2021-05-20 DIAGNOSIS — S82431D Displaced oblique fracture of shaft of right fibula, subsequent encounter for closed fracture with routine healing: Secondary | ICD-10-CM | POA: Diagnosis not present

## 2021-05-20 DIAGNOSIS — R52 Pain, unspecified: Secondary | ICD-10-CM | POA: Diagnosis not present

## 2021-05-20 DIAGNOSIS — R0600 Dyspnea, unspecified: Secondary | ICD-10-CM | POA: Diagnosis not present

## 2021-05-20 DIAGNOSIS — J302 Other seasonal allergic rhinitis: Secondary | ICD-10-CM | POA: Diagnosis not present

## 2021-05-20 DIAGNOSIS — E039 Hypothyroidism, unspecified: Secondary | ICD-10-CM | POA: Diagnosis not present

## 2021-05-20 DIAGNOSIS — Z8616 Personal history of COVID-19: Secondary | ICD-10-CM | POA: Diagnosis not present

## 2021-05-20 DIAGNOSIS — I4891 Unspecified atrial fibrillation: Secondary | ICD-10-CM | POA: Diagnosis not present

## 2021-05-20 DIAGNOSIS — Z9181 History of falling: Secondary | ICD-10-CM | POA: Diagnosis not present

## 2021-05-20 DIAGNOSIS — Z85828 Personal history of other malignant neoplasm of skin: Secondary | ICD-10-CM | POA: Diagnosis not present

## 2021-05-20 DIAGNOSIS — E161 Other hypoglycemia: Secondary | ICD-10-CM | POA: Diagnosis not present

## 2021-05-20 DIAGNOSIS — E079 Disorder of thyroid, unspecified: Secondary | ICD-10-CM | POA: Diagnosis not present

## 2021-05-20 DIAGNOSIS — G4489 Other headache syndrome: Secondary | ICD-10-CM | POA: Diagnosis not present

## 2021-05-20 DIAGNOSIS — K5900 Constipation, unspecified: Secondary | ICD-10-CM | POA: Diagnosis not present

## 2021-05-20 DIAGNOSIS — I480 Paroxysmal atrial fibrillation: Secondary | ICD-10-CM | POA: Diagnosis not present

## 2021-05-20 DIAGNOSIS — F39 Unspecified mood [affective] disorder: Secondary | ICD-10-CM | POA: Diagnosis not present

## 2021-05-20 DIAGNOSIS — G43909 Migraine, unspecified, not intractable, without status migrainosus: Secondary | ICD-10-CM | POA: Diagnosis not present

## 2021-05-20 DIAGNOSIS — Z79899 Other long term (current) drug therapy: Secondary | ICD-10-CM | POA: Diagnosis not present

## 2021-05-20 DIAGNOSIS — D509 Iron deficiency anemia, unspecified: Secondary | ICD-10-CM | POA: Diagnosis not present

## 2021-05-20 DIAGNOSIS — I2510 Atherosclerotic heart disease of native coronary artery without angina pectoris: Secondary | ICD-10-CM | POA: Diagnosis not present

## 2021-05-21 ENCOUNTER — Encounter: Payer: Self-pay | Admitting: Student

## 2021-05-21 ENCOUNTER — Ambulatory Visit: Payer: PPO | Admitting: Student

## 2021-05-21 VITALS — BP 153/84 | HR 69 | Temp 97.9°F | Resp 16 | Ht 71.0 in | Wt 200.0 lb

## 2021-05-21 DIAGNOSIS — M5416 Radiculopathy, lumbar region: Secondary | ICD-10-CM | POA: Diagnosis not present

## 2021-05-21 DIAGNOSIS — I10 Essential (primary) hypertension: Secondary | ICD-10-CM

## 2021-05-21 MED ORDER — OLMESARTAN MEDOXOMIL 40 MG PO TABS: 40.0000 mg | ORAL_TABLET | Freq: Every day | ORAL | 3 refills | Status: DC

## 2021-05-21 MED ORDER — HYDRALAZINE HCL 25 MG PO TABS: 25.0000 mg | ORAL_TABLET | Freq: Three times a day (TID) | ORAL | 3 refills | Status: DC | PRN

## 2021-05-21 NOTE — Progress Notes (Signed)
? ?Primary Physician/Referring:  Glenda Chroman, MD ? ?Patient ID: Brett Matthews, male    DOB: [birth year], 88 y.o.   MRN: [id number]  ? ?Chief Complaint  ?Patient presents with  ? Medication Management  ? Follow-up  ? ?HPI:   ? ?Brett Matthews  is a 88 y.o.  Caucasian male with paroxysmal atrial fibrillation, hypertension, hyperlipidemia, controlled diabetes mellitus, moderate coronary artery disease in mid circumflex with 60% stenosis by angiography in 2013, last presentation on 01/16/2019 with chest pain and dyspnea SP direct-current cardioversion on 01/18/2019. Flecainide discontinued due to bradycardia. Due to recurrence of symptomatic atrial fibrillation underwent direct-current cardioversion on 03/20/2020 and converted to sinus rhythm with 1 shock.   ? ?Patient was last seen in the office 02/04/2021 at which time he was stable from a cardiovascular standpoint and advised to follow-up in 6 months.  Patient however now presents for urgent visit at his request as he was seen yesterday in [facility] emergency department with elevated blood pressure.  I personally reviewed external ED records which revealed blood pressure 146/99 mmHg, work-up otherwise unremarkable.  Patient now presents to our office for "medication management".  Patient reports he has been experiencing intermittent headaches over the last few weeks, and he developed this yesterday during a physical therapy session.  PT checked his blood pressure noted to be elevated, however patient is unable to provide blood pressure reading.  I personally reviewed external records from [facility], however it is unclear what his initial blood pressure was at presentation. ? ?Patient thankfully brought all of his medications that he is actively taking with him today.  Upon thorough medication reconciliation it appears patient is not presently taking tramadol, Seroquel, Zofran, Robaxin, folic acid, Imdur, spironolactone, or iron supplement.  Patient also admits  to high sodium dietary intake. ? ?Patient is accompanied by his friend and caretaker who contributes to the history.  ? ?Past Medical History:  ?Diagnosis Date  ? Anemia, iron deficiency   ? Negative capsule endoscopy  ? Arthritis   ? Coronary atherosclerosis of native coronary artery   ? 60 % circumflex stenosis; EF 65%, Cath 6/13  ? Diverticula of colon   ? Pancolonic  ? DM (diabetes mellitus), type 2, uncontrolled   ? Dyspnea   ? Normal cardiopulmonary function test in July 2008, negative echocardiogram with "bubble" study for inter-cardiac shunt.  ? Essential hypertension, benign   ? Gastroesophageal reflux disease   ? Hemorrhoids   ? Hyperplastic colon polyp 04/06/2007  ? Hypothyroidism 2003  ? Status post total thyroidectomy  ? Memory difficulties   ? Migraines   ? Neoplasm of lymphatic and hematopoietic tissue   ? Paroxysmal atrial fibrillation (HCC)   ? Diagnosed 2005  ? Skin cancer   ? ?Past Surgical History:  ?Procedure Laterality Date  ? BACK SURGERY    ? BONE MARROW BIOPSY  1990's  ? CARDIAC CATHETERIZATION    ? CARDIOVERSION N/A 01/18/2019  ? Procedure: CARDIOVERSION;  Surgeon: Adrian Prows, MD;  Location: [place];  Service: Cardiovascular;  Laterality: N/A;  ? CARDIOVERSION N/A 03/20/2020  ? Procedure: CARDIOVERSION;  Surgeon: Adrian Prows, MD;  Location: [facility] ENDOSCOPY;  Service: Cardiovascular;  Laterality: N/A;  ? CATARACT EXTRACTION Bilateral   ? COLONOSCOPY  04/06/2007  ? Dr. Gala Romney- Normal rectum with scattered pancolonic diverticula and slightly redundant elongated colon, diminutive polpy midsigmoid, remainder of colonic mucosa appeared normal. bx= hyperplastic polyp  ? COLONOSCOPY N/A 11/29/2012  ? ZPH:[id number] preparation. Friable anal canal/internal hemorrhoids; otherwise,  normal rectum. Normal-appearing colonic mucosa  ? COLONOSCOPY WITH PROPOFOL N/A 03/12/2017  ? Procedure: COLONOSCOPY WITH PROPOFOL;  Surgeon: Daneil Dolin, MD;  Location: [facility] ENDO SU[facility];  Service: Endoscopy;  Laterality: N/A;   7:30am  ? ESOPHAGOGASTRODUODENOSCOPY  04/06/2007  ? Dr. Gala Romney- normal esophagus s/p nissen fundoplication, intact nissen wrap o/w normal stomach  ? ESOPHAGOGASTRODUODENOSCOPY N/A 11/29/2012  ? RMR: Abnormall distal esophagus bx c/w GERD. prior fundoplication. Gastric polyps  -bx benign. Status post biopsy of normal--appearing duodenal mucosa (bx neg)  ? HEMORRHOID SURGERY N/A 04/06/2017  ? Procedure: EXTENSIVE HEMORRHOIDECTOMY;  Surgeon: Aviva Signs, MD;  Location: [facility] ORS;  Service: General;  Laterality: N/A;  ? LEFT HEART CATHETERIZATION WITH CORONARY ANGIOGRAM N/A 06/27/2011  ? Procedure: LEFT HEART CATHETERIZATION WITH CORONARY ANGIOGRAM;  Surgeon: Thayer Headings, MD;  Location: [facility] CATH LAB;  Service: Cardiovascular;  Laterality: N/A;  ? NISSEN FUNDOPLICATION    ? ORIF ANKLE FRACTURE Right 03/19/2021  ? Procedure: OPEN REDUCTION INTERNAL FIXATION (ORIF) ANKLE FRACTURE, COLATERAL ANKLE LIGAMENT REPAIR;  Surgeon: Renette Butters, MD;  Location: [facility] ORS;  Service: Orthopedics;  Laterality: Right;  ? POLYPECTOMY  03/12/2017  ? Procedure: POLYPECTOMY;  Surgeon: Daneil Dolin, MD;  Location: [facility] ENDO [facility];  Service: Endoscopy;;  cecal x3  ? POSTERIOR LAMINECTOMY / DECOMPRESSION LUMBAR SPINE  ~ 1990's  ? SKIN CANCER EXCISION    ? "off my back and chest; from sun"  ? ?Family History  ?Problem Relation Age of Onset  ? Hypertension Mother   ? Stroke Mother   ? Cancer Father   ? Cancer Sister   ? Stroke Brother   ? Cancer Other   ? Stroke Other   ? Colon cancer Neg Hx   ? ?Social History  ? ?Tobacco Use  ? Smoking status: Never  ? Smokeless tobacco: Never  ?Substance Use Topics  ? Alcohol use: No  ?Marital Status: Divorced ? ?ROS  ?Review of Systems  ?Cardiovascular:  Negative for chest pain, claudication, leg swelling, near-syncope, orthopnea, palpitations, paroxysmal nocturnal dyspnea and syncope.  ?Respiratory:  Negative for shortness of breath.   ?Objective  ? ? ?  05/21/2021  ?  3:21 PM 03/23/2021  ?  3:38 AM 03/22/2021  ?   8:46 PM  ?Vitals with BMI  ?Height $Remove'5\' 11"'mZcsLRp$     ?Weight 200 lbs    ?BMI 27.91    ?Systolic 681 157 262  ?Diastolic 84 90 85  ?Pulse 69 72 67  ?  ?Physical Exam ?Vitals reviewed.  ?Constitutional:   ?   Appearance: Normal appearance.  ?   Comments: Ambulating with [place]  ?Cardiovascular:  ?   Rate and Rhythm: Normal rate and regular rhythm.  ?   Pulses: Intact distal pulses.  ?   Heart sounds: Normal heart sounds, S1 normal and S2 normal. No murmur heard. ?  No gallop.  ?Pulmonary:  ?   Effort: Pulmonary effort is normal. No respiratory distress.  ?   Breath sounds: Normal breath sounds. No wheezing, rhonchi or rales.  ?Musculoskeletal:  ?   Right lower leg: No edema.  ?   Left lower leg: No edema.  ?Neurological:  ?   Mental Status: He is alert.  ? ? ?Laboratory examination:  ? ?Recent Labs  ?  03/20/21 ?0355 03/21/21 ?9741 03/22/21 ?6384  ?NA 137 136 137  ?K 3.9 3.3* 3.7  ?CL 102 101 103  ?CO2 $Rem'26 29 27  'XfGv$ ?GLUCOSE 221* 75 115*  ?BUN $Rem'18 14 13  'RhaJ$ ?  CREATININE 0.80 0.87 0.84  ?CALCIUM 9.7 9.2 9.1  ?GFRNONAA >60 >60 >60  ? ?CrCl cannot be calculated (Patient's most recent lab result is older than the maximum 21 days allowed.).  ? ?  Latest Ref Rng & Units 03/22/2021  ?  5:13 AM 03/21/2021  ?  9:29 AM 03/20/2021  ?  5:15 AM  ?CMP  ?Glucose 70 - 99 mg/dL 115   75   221    ?BUN 8 - 23 mg/dL $Remove'13   14   18    'GrcGFIj$ ?Creatinine 0.61 - 1.24 mg/dL 0.84   0.87   0.80    ?Sodium 135 - 145 mmol/L 137   136   137    ?Potassium 3.5 - 5.1 mmol/L 3.7   3.3   3.9    ?Chloride 98 - 111 mmol/L 103   101   102    ?CO2 22 - 32 mmol/L $RemoveB'27   29   26    'VjztgqqX$ ?Calcium 8.9 - 10.3 mg/dL 9.1   9.2   9.7    ?Total Protein 6.5 - 8.1 g/dL   6.0    ?Total Bilirubin 0.3 - 1.2 mg/dL   0.4    ?Alkaline Phos 38 - 126 U/L   93    ?AST 15 - 41 U/L   13    ?ALT 0 - 44 U/L   12    ? ? ?  Latest Ref Rng & Units 03/22/2021  ?  5:11 AM 03/21/2021  ?  9:23 AM 03/20/2021  ?  5:15 AM  ?CBC  ?WBC 4.0 - 10.5 K/uL 7.7   8.2   9.3    ?Hemoglobin 13.0 - 17.0 g/dL 9.8   9.6   10.0     ?Hematocrit 39.0 - 52.0 % 31.9   30.9   31.5    ?Platelets 150 - 400 K/uL 365   342   311    ? ?HEMOGLOBIN A1C ?Lab Results  ?Component Value Date  ? HGBA1C 6.1 (H) 03/19/2021  ? MPG 128 03/19/2021  ? ?External

## 2021-05-23 DIAGNOSIS — S82431D Displaced oblique fracture of shaft of right fibula, subsequent encounter for closed fracture with routine healing: Secondary | ICD-10-CM | POA: Diagnosis not present

## 2021-05-23 DIAGNOSIS — Z9181 History of falling: Secondary | ICD-10-CM | POA: Diagnosis not present

## 2021-05-23 DIAGNOSIS — F39 Unspecified mood [affective] disorder: Secondary | ICD-10-CM | POA: Diagnosis not present

## 2021-05-23 DIAGNOSIS — J302 Other seasonal allergic rhinitis: Secondary | ICD-10-CM | POA: Diagnosis not present

## 2021-05-23 DIAGNOSIS — G4700 Insomnia, unspecified: Secondary | ICD-10-CM | POA: Diagnosis not present

## 2021-05-23 DIAGNOSIS — K5900 Constipation, unspecified: Secondary | ICD-10-CM | POA: Diagnosis not present

## 2021-05-23 DIAGNOSIS — E039 Hypothyroidism, unspecified: Secondary | ICD-10-CM | POA: Diagnosis not present

## 2021-05-23 DIAGNOSIS — K219 Gastro-esophageal reflux disease without esophagitis: Secondary | ICD-10-CM | POA: Diagnosis not present

## 2021-05-23 DIAGNOSIS — I10 Essential (primary) hypertension: Secondary | ICD-10-CM | POA: Diagnosis not present

## 2021-05-23 DIAGNOSIS — I480 Paroxysmal atrial fibrillation: Secondary | ICD-10-CM | POA: Diagnosis not present

## 2021-05-23 DIAGNOSIS — N400 Enlarged prostate without lower urinary tract symptoms: Secondary | ICD-10-CM | POA: Diagnosis not present

## 2021-05-23 DIAGNOSIS — D509 Iron deficiency anemia, unspecified: Secondary | ICD-10-CM | POA: Diagnosis not present

## 2021-05-23 DIAGNOSIS — S82841D Displaced bimalleolar fracture of right lower leg, subsequent encounter for closed fracture with routine healing: Secondary | ICD-10-CM | POA: Diagnosis not present

## 2021-05-23 DIAGNOSIS — Z8616 Personal history of COVID-19: Secondary | ICD-10-CM | POA: Diagnosis not present

## 2021-05-23 DIAGNOSIS — E119 Type 2 diabetes mellitus without complications: Secondary | ICD-10-CM | POA: Diagnosis not present

## 2021-05-23 DIAGNOSIS — E785 Hyperlipidemia, unspecified: Secondary | ICD-10-CM | POA: Diagnosis not present

## 2021-05-23 DIAGNOSIS — I2510 Atherosclerotic heart disease of native coronary artery without angina pectoris: Secondary | ICD-10-CM | POA: Diagnosis not present

## 2021-05-23 DIAGNOSIS — G43909 Migraine, unspecified, not intractable, without status migrainosus: Secondary | ICD-10-CM | POA: Diagnosis not present

## 2021-05-27 DIAGNOSIS — J302 Other seasonal allergic rhinitis: Secondary | ICD-10-CM | POA: Diagnosis not present

## 2021-05-27 DIAGNOSIS — S82431D Displaced oblique fracture of shaft of right fibula, subsequent encounter for closed fracture with routine healing: Secondary | ICD-10-CM | POA: Diagnosis not present

## 2021-05-27 DIAGNOSIS — G43909 Migraine, unspecified, not intractable, without status migrainosus: Secondary | ICD-10-CM | POA: Diagnosis not present

## 2021-05-27 DIAGNOSIS — I2510 Atherosclerotic heart disease of native coronary artery without angina pectoris: Secondary | ICD-10-CM | POA: Diagnosis not present

## 2021-05-27 DIAGNOSIS — Z9181 History of falling: Secondary | ICD-10-CM | POA: Diagnosis not present

## 2021-05-27 DIAGNOSIS — N400 Enlarged prostate without lower urinary tract symptoms: Secondary | ICD-10-CM | POA: Diagnosis not present

## 2021-05-27 DIAGNOSIS — E785 Hyperlipidemia, unspecified: Secondary | ICD-10-CM | POA: Diagnosis not present

## 2021-05-27 DIAGNOSIS — K5900 Constipation, unspecified: Secondary | ICD-10-CM | POA: Diagnosis not present

## 2021-05-27 DIAGNOSIS — F39 Unspecified mood [affective] disorder: Secondary | ICD-10-CM | POA: Diagnosis not present

## 2021-05-27 DIAGNOSIS — E119 Type 2 diabetes mellitus without complications: Secondary | ICD-10-CM | POA: Diagnosis not present

## 2021-05-27 DIAGNOSIS — S82841D Displaced bimalleolar fracture of right lower leg, subsequent encounter for closed fracture with routine healing: Secondary | ICD-10-CM | POA: Diagnosis not present

## 2021-05-27 DIAGNOSIS — G4700 Insomnia, unspecified: Secondary | ICD-10-CM | POA: Diagnosis not present

## 2021-05-27 DIAGNOSIS — Z8616 Personal history of COVID-19: Secondary | ICD-10-CM | POA: Diagnosis not present

## 2021-05-27 DIAGNOSIS — D509 Iron deficiency anemia, unspecified: Secondary | ICD-10-CM | POA: Diagnosis not present

## 2021-05-27 DIAGNOSIS — E039 Hypothyroidism, unspecified: Secondary | ICD-10-CM | POA: Diagnosis not present

## 2021-05-27 DIAGNOSIS — I480 Paroxysmal atrial fibrillation: Secondary | ICD-10-CM | POA: Diagnosis not present

## 2021-05-27 DIAGNOSIS — K219 Gastro-esophageal reflux disease without esophagitis: Secondary | ICD-10-CM | POA: Diagnosis not present

## 2021-05-27 DIAGNOSIS — I10 Essential (primary) hypertension: Secondary | ICD-10-CM | POA: Diagnosis not present

## 2021-05-28 DIAGNOSIS — S82841D Displaced bimalleolar fracture of right lower leg, subsequent encounter for closed fracture with routine healing: Secondary | ICD-10-CM | POA: Diagnosis not present

## 2021-05-28 DIAGNOSIS — G43909 Migraine, unspecified, not intractable, without status migrainosus: Secondary | ICD-10-CM | POA: Diagnosis not present

## 2021-05-28 DIAGNOSIS — I2510 Atherosclerotic heart disease of native coronary artery without angina pectoris: Secondary | ICD-10-CM | POA: Diagnosis not present

## 2021-05-28 DIAGNOSIS — N400 Enlarged prostate without lower urinary tract symptoms: Secondary | ICD-10-CM | POA: Diagnosis not present

## 2021-05-28 DIAGNOSIS — F39 Unspecified mood [affective] disorder: Secondary | ICD-10-CM | POA: Diagnosis not present

## 2021-05-28 DIAGNOSIS — E785 Hyperlipidemia, unspecified: Secondary | ICD-10-CM | POA: Diagnosis not present

## 2021-05-28 DIAGNOSIS — I480 Paroxysmal atrial fibrillation: Secondary | ICD-10-CM | POA: Diagnosis not present

## 2021-05-28 DIAGNOSIS — E039 Hypothyroidism, unspecified: Secondary | ICD-10-CM | POA: Diagnosis not present

## 2021-05-28 DIAGNOSIS — J302 Other seasonal allergic rhinitis: Secondary | ICD-10-CM | POA: Diagnosis not present

## 2021-05-28 DIAGNOSIS — D509 Iron deficiency anemia, unspecified: Secondary | ICD-10-CM | POA: Diagnosis not present

## 2021-05-28 DIAGNOSIS — G4700 Insomnia, unspecified: Secondary | ICD-10-CM | POA: Diagnosis not present

## 2021-05-28 DIAGNOSIS — E119 Type 2 diabetes mellitus without complications: Secondary | ICD-10-CM | POA: Diagnosis not present

## 2021-05-28 DIAGNOSIS — I10 Essential (primary) hypertension: Secondary | ICD-10-CM | POA: Diagnosis not present

## 2021-05-28 DIAGNOSIS — K5900 Constipation, unspecified: Secondary | ICD-10-CM | POA: Diagnosis not present

## 2021-05-28 DIAGNOSIS — Z9181 History of falling: Secondary | ICD-10-CM | POA: Diagnosis not present

## 2021-05-28 DIAGNOSIS — Z8616 Personal history of COVID-19: Secondary | ICD-10-CM | POA: Diagnosis not present

## 2021-05-28 DIAGNOSIS — S82431D Displaced oblique fracture of shaft of right fibula, subsequent encounter for closed fracture with routine healing: Secondary | ICD-10-CM | POA: Diagnosis not present

## 2021-05-28 DIAGNOSIS — K219 Gastro-esophageal reflux disease without esophagitis: Secondary | ICD-10-CM | POA: Diagnosis not present

## 2021-05-29 DIAGNOSIS — M5416 Radiculopathy, lumbar region: Secondary | ICD-10-CM | POA: Diagnosis not present

## 2021-05-30 DIAGNOSIS — I10 Essential (primary) hypertension: Secondary | ICD-10-CM | POA: Diagnosis not present

## 2021-05-30 DIAGNOSIS — E1142 Type 2 diabetes mellitus with diabetic polyneuropathy: Secondary | ICD-10-CM | POA: Diagnosis not present

## 2021-05-30 DIAGNOSIS — Z299 Encounter for prophylactic measures, unspecified: Secondary | ICD-10-CM | POA: Diagnosis not present

## 2021-05-30 DIAGNOSIS — E1165 Type 2 diabetes mellitus with hyperglycemia: Secondary | ICD-10-CM | POA: Diagnosis not present

## 2021-05-30 DIAGNOSIS — K5900 Constipation, unspecified: Secondary | ICD-10-CM | POA: Diagnosis not present

## 2021-06-04 DIAGNOSIS — G4700 Insomnia, unspecified: Secondary | ICD-10-CM | POA: Diagnosis not present

## 2021-06-04 DIAGNOSIS — K5900 Constipation, unspecified: Secondary | ICD-10-CM | POA: Diagnosis not present

## 2021-06-04 DIAGNOSIS — F39 Unspecified mood [affective] disorder: Secondary | ICD-10-CM | POA: Diagnosis not present

## 2021-06-04 DIAGNOSIS — Z8616 Personal history of COVID-19: Secondary | ICD-10-CM | POA: Diagnosis not present

## 2021-06-04 DIAGNOSIS — I2510 Atherosclerotic heart disease of native coronary artery without angina pectoris: Secondary | ICD-10-CM | POA: Diagnosis not present

## 2021-06-04 DIAGNOSIS — E119 Type 2 diabetes mellitus without complications: Secondary | ICD-10-CM | POA: Diagnosis not present

## 2021-06-04 DIAGNOSIS — Z9181 History of falling: Secondary | ICD-10-CM | POA: Diagnosis not present

## 2021-06-04 DIAGNOSIS — E785 Hyperlipidemia, unspecified: Secondary | ICD-10-CM | POA: Diagnosis not present

## 2021-06-04 DIAGNOSIS — M5416 Radiculopathy, lumbar region: Secondary | ICD-10-CM | POA: Diagnosis not present

## 2021-06-04 DIAGNOSIS — I10 Essential (primary) hypertension: Secondary | ICD-10-CM | POA: Diagnosis not present

## 2021-06-04 DIAGNOSIS — S82431D Displaced oblique fracture of shaft of right fibula, subsequent encounter for closed fracture with routine healing: Secondary | ICD-10-CM | POA: Diagnosis not present

## 2021-06-04 DIAGNOSIS — G43909 Migraine, unspecified, not intractable, without status migrainosus: Secondary | ICD-10-CM | POA: Diagnosis not present

## 2021-06-04 DIAGNOSIS — I480 Paroxysmal atrial fibrillation: Secondary | ICD-10-CM | POA: Diagnosis not present

## 2021-06-04 DIAGNOSIS — N400 Enlarged prostate without lower urinary tract symptoms: Secondary | ICD-10-CM | POA: Diagnosis not present

## 2021-06-04 DIAGNOSIS — K219 Gastro-esophageal reflux disease without esophagitis: Secondary | ICD-10-CM | POA: Diagnosis not present

## 2021-06-04 DIAGNOSIS — S82841D Displaced bimalleolar fracture of right lower leg, subsequent encounter for closed fracture with routine healing: Secondary | ICD-10-CM | POA: Diagnosis not present

## 2021-06-04 DIAGNOSIS — E039 Hypothyroidism, unspecified: Secondary | ICD-10-CM | POA: Diagnosis not present

## 2021-06-04 DIAGNOSIS — J302 Other seasonal allergic rhinitis: Secondary | ICD-10-CM | POA: Diagnosis not present

## 2021-06-04 DIAGNOSIS — D509 Iron deficiency anemia, unspecified: Secondary | ICD-10-CM | POA: Diagnosis not present

## 2021-06-05 DIAGNOSIS — I10 Essential (primary) hypertension: Secondary | ICD-10-CM | POA: Diagnosis not present

## 2021-06-05 DIAGNOSIS — M170 Bilateral primary osteoarthritis of knee: Secondary | ICD-10-CM | POA: Diagnosis not present

## 2021-06-10 DIAGNOSIS — K219 Gastro-esophageal reflux disease without esophagitis: Secondary | ICD-10-CM | POA: Diagnosis not present

## 2021-06-10 DIAGNOSIS — E785 Hyperlipidemia, unspecified: Secondary | ICD-10-CM | POA: Diagnosis not present

## 2021-06-10 DIAGNOSIS — S82841D Displaced bimalleolar fracture of right lower leg, subsequent encounter for closed fracture with routine healing: Secondary | ICD-10-CM | POA: Diagnosis not present

## 2021-06-10 DIAGNOSIS — G43909 Migraine, unspecified, not intractable, without status migrainosus: Secondary | ICD-10-CM | POA: Diagnosis not present

## 2021-06-10 DIAGNOSIS — Z9181 History of falling: Secondary | ICD-10-CM | POA: Diagnosis not present

## 2021-06-10 DIAGNOSIS — I10 Essential (primary) hypertension: Secondary | ICD-10-CM | POA: Diagnosis not present

## 2021-06-10 DIAGNOSIS — N400 Enlarged prostate without lower urinary tract symptoms: Secondary | ICD-10-CM | POA: Diagnosis not present

## 2021-06-10 DIAGNOSIS — E119 Type 2 diabetes mellitus without complications: Secondary | ICD-10-CM | POA: Diagnosis not present

## 2021-06-10 DIAGNOSIS — K5900 Constipation, unspecified: Secondary | ICD-10-CM | POA: Diagnosis not present

## 2021-06-10 DIAGNOSIS — Z8616 Personal history of COVID-19: Secondary | ICD-10-CM | POA: Diagnosis not present

## 2021-06-10 DIAGNOSIS — G4700 Insomnia, unspecified: Secondary | ICD-10-CM | POA: Diagnosis not present

## 2021-06-10 DIAGNOSIS — I2510 Atherosclerotic heart disease of native coronary artery without angina pectoris: Secondary | ICD-10-CM | POA: Diagnosis not present

## 2021-06-10 DIAGNOSIS — J302 Other seasonal allergic rhinitis: Secondary | ICD-10-CM | POA: Diagnosis not present

## 2021-06-10 DIAGNOSIS — F39 Unspecified mood [affective] disorder: Secondary | ICD-10-CM | POA: Diagnosis not present

## 2021-06-10 DIAGNOSIS — I480 Paroxysmal atrial fibrillation: Secondary | ICD-10-CM | POA: Diagnosis not present

## 2021-06-10 DIAGNOSIS — S82431D Displaced oblique fracture of shaft of right fibula, subsequent encounter for closed fracture with routine healing: Secondary | ICD-10-CM | POA: Diagnosis not present

## 2021-06-10 DIAGNOSIS — D509 Iron deficiency anemia, unspecified: Secondary | ICD-10-CM | POA: Diagnosis not present

## 2021-06-10 DIAGNOSIS — E039 Hypothyroidism, unspecified: Secondary | ICD-10-CM | POA: Diagnosis not present

## 2021-06-12 DIAGNOSIS — S82841D Displaced bimalleolar fracture of right lower leg, subsequent encounter for closed fracture with routine healing: Secondary | ICD-10-CM | POA: Diagnosis not present

## 2021-06-12 DIAGNOSIS — D692 Other nonthrombocytopenic purpura: Secondary | ICD-10-CM | POA: Diagnosis not present

## 2021-06-12 DIAGNOSIS — I10 Essential (primary) hypertension: Secondary | ICD-10-CM | POA: Diagnosis not present

## 2021-06-12 DIAGNOSIS — Z515 Encounter for palliative care: Secondary | ICD-10-CM | POA: Diagnosis not present

## 2021-06-12 DIAGNOSIS — F3342 Major depressive disorder, recurrent, in full remission: Secondary | ICD-10-CM | POA: Diagnosis not present

## 2021-06-12 DIAGNOSIS — E1159 Type 2 diabetes mellitus with other circulatory complications: Secondary | ICD-10-CM | POA: Diagnosis not present

## 2021-06-12 DIAGNOSIS — M170 Bilateral primary osteoarthritis of knee: Secondary | ICD-10-CM | POA: Diagnosis not present

## 2021-06-12 DIAGNOSIS — Z7984 Long term (current) use of oral hypoglycemic drugs: Secondary | ICD-10-CM | POA: Diagnosis not present

## 2021-06-12 DIAGNOSIS — R531 Weakness: Secondary | ICD-10-CM | POA: Diagnosis not present

## 2021-06-14 DIAGNOSIS — M5416 Radiculopathy, lumbar region: Secondary | ICD-10-CM | POA: Diagnosis not present

## 2021-06-25 DIAGNOSIS — S82841D Displaced bimalleolar fracture of right lower leg, subsequent encounter for closed fracture with routine healing: Secondary | ICD-10-CM | POA: Diagnosis not present

## 2021-06-25 DIAGNOSIS — S82431D Displaced oblique fracture of shaft of right fibula, subsequent encounter for closed fracture with routine healing: Secondary | ICD-10-CM | POA: Diagnosis not present

## 2021-06-25 DIAGNOSIS — E039 Hypothyroidism, unspecified: Secondary | ICD-10-CM | POA: Diagnosis not present

## 2021-06-25 DIAGNOSIS — F39 Unspecified mood [affective] disorder: Secondary | ICD-10-CM | POA: Diagnosis not present

## 2021-06-25 DIAGNOSIS — K219 Gastro-esophageal reflux disease without esophagitis: Secondary | ICD-10-CM | POA: Diagnosis not present

## 2021-06-25 DIAGNOSIS — J302 Other seasonal allergic rhinitis: Secondary | ICD-10-CM | POA: Diagnosis not present

## 2021-06-25 DIAGNOSIS — E119 Type 2 diabetes mellitus without complications: Secondary | ICD-10-CM | POA: Diagnosis not present

## 2021-06-25 DIAGNOSIS — N400 Enlarged prostate without lower urinary tract symptoms: Secondary | ICD-10-CM | POA: Diagnosis not present

## 2021-06-25 DIAGNOSIS — Z8616 Personal history of COVID-19: Secondary | ICD-10-CM | POA: Diagnosis not present

## 2021-06-25 DIAGNOSIS — I480 Paroxysmal atrial fibrillation: Secondary | ICD-10-CM | POA: Diagnosis not present

## 2021-06-25 DIAGNOSIS — E785 Hyperlipidemia, unspecified: Secondary | ICD-10-CM | POA: Diagnosis not present

## 2021-06-25 DIAGNOSIS — D509 Iron deficiency anemia, unspecified: Secondary | ICD-10-CM | POA: Diagnosis not present

## 2021-06-25 DIAGNOSIS — I10 Essential (primary) hypertension: Secondary | ICD-10-CM | POA: Diagnosis not present

## 2021-06-25 DIAGNOSIS — K5900 Constipation, unspecified: Secondary | ICD-10-CM | POA: Diagnosis not present

## 2021-06-25 DIAGNOSIS — I2510 Atherosclerotic heart disease of native coronary artery without angina pectoris: Secondary | ICD-10-CM | POA: Diagnosis not present

## 2021-06-25 DIAGNOSIS — Z9181 History of falling: Secondary | ICD-10-CM | POA: Diagnosis not present

## 2021-06-25 DIAGNOSIS — G43909 Migraine, unspecified, not intractable, without status migrainosus: Secondary | ICD-10-CM | POA: Diagnosis not present

## 2021-06-25 DIAGNOSIS — G4700 Insomnia, unspecified: Secondary | ICD-10-CM | POA: Diagnosis not present

## 2021-07-03 DIAGNOSIS — I2510 Atherosclerotic heart disease of native coronary artery without angina pectoris: Secondary | ICD-10-CM | POA: Diagnosis not present

## 2021-07-03 DIAGNOSIS — Z9181 History of falling: Secondary | ICD-10-CM | POA: Diagnosis not present

## 2021-07-03 DIAGNOSIS — K219 Gastro-esophageal reflux disease without esophagitis: Secondary | ICD-10-CM | POA: Diagnosis not present

## 2021-07-03 DIAGNOSIS — K5900 Constipation, unspecified: Secondary | ICD-10-CM | POA: Diagnosis not present

## 2021-07-03 DIAGNOSIS — N400 Enlarged prostate without lower urinary tract symptoms: Secondary | ICD-10-CM | POA: Diagnosis not present

## 2021-07-03 DIAGNOSIS — Z8616 Personal history of COVID-19: Secondary | ICD-10-CM | POA: Diagnosis not present

## 2021-07-03 DIAGNOSIS — E039 Hypothyroidism, unspecified: Secondary | ICD-10-CM | POA: Diagnosis not present

## 2021-07-03 DIAGNOSIS — J302 Other seasonal allergic rhinitis: Secondary | ICD-10-CM | POA: Diagnosis not present

## 2021-07-03 DIAGNOSIS — I480 Paroxysmal atrial fibrillation: Secondary | ICD-10-CM | POA: Diagnosis not present

## 2021-07-03 DIAGNOSIS — D509 Iron deficiency anemia, unspecified: Secondary | ICD-10-CM | POA: Diagnosis not present

## 2021-07-03 DIAGNOSIS — S82431D Displaced oblique fracture of shaft of right fibula, subsequent encounter for closed fracture with routine healing: Secondary | ICD-10-CM | POA: Diagnosis not present

## 2021-07-03 DIAGNOSIS — G4700 Insomnia, unspecified: Secondary | ICD-10-CM | POA: Diagnosis not present

## 2021-07-03 DIAGNOSIS — G43909 Migraine, unspecified, not intractable, without status migrainosus: Secondary | ICD-10-CM | POA: Diagnosis not present

## 2021-07-03 DIAGNOSIS — F39 Unspecified mood [affective] disorder: Secondary | ICD-10-CM | POA: Diagnosis not present

## 2021-07-03 DIAGNOSIS — I10 Essential (primary) hypertension: Secondary | ICD-10-CM | POA: Diagnosis not present

## 2021-07-03 DIAGNOSIS — E785 Hyperlipidemia, unspecified: Secondary | ICD-10-CM | POA: Diagnosis not present

## 2021-07-03 DIAGNOSIS — E119 Type 2 diabetes mellitus without complications: Secondary | ICD-10-CM | POA: Diagnosis not present

## 2021-07-03 DIAGNOSIS — S82841D Displaced bimalleolar fracture of right lower leg, subsequent encounter for closed fracture with routine healing: Secondary | ICD-10-CM | POA: Diagnosis not present

## 2021-07-05 ENCOUNTER — Inpatient Hospital Stay (HOSPITAL_COMMUNITY): Admission: RE | Admit: 2021-07-05 | Payer: PPO | Source: Ambulatory Visit

## 2021-07-05 DIAGNOSIS — M170 Bilateral primary osteoarthritis of knee: Secondary | ICD-10-CM | POA: Diagnosis not present

## 2021-07-05 DIAGNOSIS — I10 Essential (primary) hypertension: Secondary | ICD-10-CM | POA: Diagnosis not present

## 2021-07-10 DIAGNOSIS — K219 Gastro-esophageal reflux disease without esophagitis: Secondary | ICD-10-CM | POA: Diagnosis not present

## 2021-07-10 DIAGNOSIS — G4700 Insomnia, unspecified: Secondary | ICD-10-CM | POA: Diagnosis not present

## 2021-07-10 DIAGNOSIS — S82841D Displaced bimalleolar fracture of right lower leg, subsequent encounter for closed fracture with routine healing: Secondary | ICD-10-CM | POA: Diagnosis not present

## 2021-07-10 DIAGNOSIS — Z9181 History of falling: Secondary | ICD-10-CM | POA: Diagnosis not present

## 2021-07-10 DIAGNOSIS — I2510 Atherosclerotic heart disease of native coronary artery without angina pectoris: Secondary | ICD-10-CM | POA: Diagnosis not present

## 2021-07-10 DIAGNOSIS — E039 Hypothyroidism, unspecified: Secondary | ICD-10-CM | POA: Diagnosis not present

## 2021-07-10 DIAGNOSIS — N400 Enlarged prostate without lower urinary tract symptoms: Secondary | ICD-10-CM | POA: Diagnosis not present

## 2021-07-10 DIAGNOSIS — J302 Other seasonal allergic rhinitis: Secondary | ICD-10-CM | POA: Diagnosis not present

## 2021-07-10 DIAGNOSIS — E119 Type 2 diabetes mellitus without complications: Secondary | ICD-10-CM | POA: Diagnosis not present

## 2021-07-10 DIAGNOSIS — Z8616 Personal history of COVID-19: Secondary | ICD-10-CM | POA: Diagnosis not present

## 2021-07-10 DIAGNOSIS — F39 Unspecified mood [affective] disorder: Secondary | ICD-10-CM | POA: Diagnosis not present

## 2021-07-10 DIAGNOSIS — S82431D Displaced oblique fracture of shaft of right fibula, subsequent encounter for closed fracture with routine healing: Secondary | ICD-10-CM | POA: Diagnosis not present

## 2021-07-10 DIAGNOSIS — I10 Essential (primary) hypertension: Secondary | ICD-10-CM | POA: Diagnosis not present

## 2021-07-10 DIAGNOSIS — I480 Paroxysmal atrial fibrillation: Secondary | ICD-10-CM | POA: Diagnosis not present

## 2021-07-10 DIAGNOSIS — E785 Hyperlipidemia, unspecified: Secondary | ICD-10-CM | POA: Diagnosis not present

## 2021-07-10 DIAGNOSIS — K5900 Constipation, unspecified: Secondary | ICD-10-CM | POA: Diagnosis not present

## 2021-07-10 DIAGNOSIS — D509 Iron deficiency anemia, unspecified: Secondary | ICD-10-CM | POA: Diagnosis not present

## 2021-07-10 DIAGNOSIS — G43909 Migraine, unspecified, not intractable, without status migrainosus: Secondary | ICD-10-CM | POA: Diagnosis not present

## 2021-07-22 ENCOUNTER — Ambulatory Visit: Payer: PPO | Admitting: Student

## 2021-07-24 DIAGNOSIS — D509 Iron deficiency anemia, unspecified: Secondary | ICD-10-CM | POA: Diagnosis not present

## 2021-07-24 DIAGNOSIS — K219 Gastro-esophageal reflux disease without esophagitis: Secondary | ICD-10-CM | POA: Diagnosis not present

## 2021-07-24 DIAGNOSIS — I480 Paroxysmal atrial fibrillation: Secondary | ICD-10-CM | POA: Diagnosis not present

## 2021-07-24 DIAGNOSIS — E039 Hypothyroidism, unspecified: Secondary | ICD-10-CM | POA: Diagnosis not present

## 2021-07-24 DIAGNOSIS — J302 Other seasonal allergic rhinitis: Secondary | ICD-10-CM | POA: Diagnosis not present

## 2021-07-24 DIAGNOSIS — N400 Enlarged prostate without lower urinary tract symptoms: Secondary | ICD-10-CM | POA: Diagnosis not present

## 2021-07-24 DIAGNOSIS — E785 Hyperlipidemia, unspecified: Secondary | ICD-10-CM | POA: Diagnosis not present

## 2021-07-24 DIAGNOSIS — K5900 Constipation, unspecified: Secondary | ICD-10-CM | POA: Diagnosis not present

## 2021-07-24 DIAGNOSIS — I2510 Atherosclerotic heart disease of native coronary artery without angina pectoris: Secondary | ICD-10-CM | POA: Diagnosis not present

## 2021-07-24 DIAGNOSIS — Z9181 History of falling: Secondary | ICD-10-CM | POA: Diagnosis not present

## 2021-07-24 DIAGNOSIS — E119 Type 2 diabetes mellitus without complications: Secondary | ICD-10-CM | POA: Diagnosis not present

## 2021-07-24 DIAGNOSIS — G43909 Migraine, unspecified, not intractable, without status migrainosus: Secondary | ICD-10-CM | POA: Diagnosis not present

## 2021-07-24 DIAGNOSIS — S82431D Displaced oblique fracture of shaft of right fibula, subsequent encounter for closed fracture with routine healing: Secondary | ICD-10-CM | POA: Diagnosis not present

## 2021-07-24 DIAGNOSIS — Z8616 Personal history of COVID-19: Secondary | ICD-10-CM | POA: Diagnosis not present

## 2021-07-24 DIAGNOSIS — F39 Unspecified mood [affective] disorder: Secondary | ICD-10-CM | POA: Diagnosis not present

## 2021-07-24 DIAGNOSIS — S82841D Displaced bimalleolar fracture of right lower leg, subsequent encounter for closed fracture with routine healing: Secondary | ICD-10-CM | POA: Diagnosis not present

## 2021-07-24 DIAGNOSIS — I10 Essential (primary) hypertension: Secondary | ICD-10-CM | POA: Diagnosis not present

## 2021-07-24 DIAGNOSIS — G4700 Insomnia, unspecified: Secondary | ICD-10-CM | POA: Diagnosis not present

## 2021-07-26 DIAGNOSIS — S82841D Displaced bimalleolar fracture of right lower leg, subsequent encounter for closed fracture with routine healing: Secondary | ICD-10-CM | POA: Diagnosis not present

## 2021-07-26 DIAGNOSIS — E119 Type 2 diabetes mellitus without complications: Secondary | ICD-10-CM | POA: Diagnosis not present

## 2021-07-26 DIAGNOSIS — Z7901 Long term (current) use of anticoagulants: Secondary | ICD-10-CM | POA: Diagnosis not present

## 2021-07-26 DIAGNOSIS — Z6828 Body mass index (BMI) 28.0-28.9, adult: Secondary | ICD-10-CM | POA: Diagnosis not present

## 2021-07-26 DIAGNOSIS — E1165 Type 2 diabetes mellitus with hyperglycemia: Secondary | ICD-10-CM | POA: Diagnosis not present

## 2021-07-26 DIAGNOSIS — E039 Hypothyroidism, unspecified: Secondary | ICD-10-CM | POA: Diagnosis not present

## 2021-07-26 DIAGNOSIS — Z87891 Personal history of nicotine dependence: Secondary | ICD-10-CM | POA: Diagnosis not present

## 2021-07-26 DIAGNOSIS — S82431D Displaced oblique fracture of shaft of right fibula, subsequent encounter for closed fracture with routine healing: Secondary | ICD-10-CM | POA: Diagnosis not present

## 2021-07-26 DIAGNOSIS — Z515 Encounter for palliative care: Secondary | ICD-10-CM | POA: Diagnosis not present

## 2021-07-26 DIAGNOSIS — I10 Essential (primary) hypertension: Secondary | ICD-10-CM | POA: Diagnosis not present

## 2021-07-26 DIAGNOSIS — Z299 Encounter for prophylactic measures, unspecified: Secondary | ICD-10-CM | POA: Diagnosis not present

## 2021-07-26 DIAGNOSIS — Z789 Other specified health status: Secondary | ICD-10-CM | POA: Diagnosis not present

## 2021-07-26 DIAGNOSIS — I700 Atherosclerosis of aorta: Secondary | ICD-10-CM | POA: Diagnosis not present

## 2021-07-26 DIAGNOSIS — I4891 Unspecified atrial fibrillation: Secondary | ICD-10-CM | POA: Diagnosis not present

## 2021-07-26 DIAGNOSIS — D692 Other nonthrombocytopenic purpura: Secondary | ICD-10-CM | POA: Diagnosis not present

## 2021-07-26 DIAGNOSIS — M7020 Olecranon bursitis, unspecified elbow: Secondary | ICD-10-CM | POA: Diagnosis not present

## 2021-07-30 DIAGNOSIS — E039 Hypothyroidism, unspecified: Secondary | ICD-10-CM | POA: Diagnosis not present

## 2021-07-30 DIAGNOSIS — Z8616 Personal history of COVID-19: Secondary | ICD-10-CM | POA: Diagnosis not present

## 2021-07-30 DIAGNOSIS — Z9181 History of falling: Secondary | ICD-10-CM | POA: Diagnosis not present

## 2021-07-30 DIAGNOSIS — N400 Enlarged prostate without lower urinary tract symptoms: Secondary | ICD-10-CM | POA: Diagnosis not present

## 2021-07-30 DIAGNOSIS — G43909 Migraine, unspecified, not intractable, without status migrainosus: Secondary | ICD-10-CM | POA: Diagnosis not present

## 2021-07-30 DIAGNOSIS — S82431D Displaced oblique fracture of shaft of right fibula, subsequent encounter for closed fracture with routine healing: Secondary | ICD-10-CM | POA: Diagnosis not present

## 2021-07-30 DIAGNOSIS — E119 Type 2 diabetes mellitus without complications: Secondary | ICD-10-CM | POA: Diagnosis not present

## 2021-07-30 DIAGNOSIS — I10 Essential (primary) hypertension: Secondary | ICD-10-CM | POA: Diagnosis not present

## 2021-07-30 DIAGNOSIS — S82841D Displaced bimalleolar fracture of right lower leg, subsequent encounter for closed fracture with routine healing: Secondary | ICD-10-CM | POA: Diagnosis not present

## 2021-07-30 DIAGNOSIS — E785 Hyperlipidemia, unspecified: Secondary | ICD-10-CM | POA: Diagnosis not present

## 2021-07-30 DIAGNOSIS — K5900 Constipation, unspecified: Secondary | ICD-10-CM | POA: Diagnosis not present

## 2021-07-30 DIAGNOSIS — G4700 Insomnia, unspecified: Secondary | ICD-10-CM | POA: Diagnosis not present

## 2021-07-30 DIAGNOSIS — K219 Gastro-esophageal reflux disease without esophagitis: Secondary | ICD-10-CM | POA: Diagnosis not present

## 2021-07-30 DIAGNOSIS — I2510 Atherosclerotic heart disease of native coronary artery without angina pectoris: Secondary | ICD-10-CM | POA: Diagnosis not present

## 2021-07-30 DIAGNOSIS — I480 Paroxysmal atrial fibrillation: Secondary | ICD-10-CM | POA: Diagnosis not present

## 2021-07-30 DIAGNOSIS — F39 Unspecified mood [affective] disorder: Secondary | ICD-10-CM | POA: Diagnosis not present

## 2021-07-30 DIAGNOSIS — D509 Iron deficiency anemia, unspecified: Secondary | ICD-10-CM | POA: Diagnosis not present

## 2021-07-30 DIAGNOSIS — J302 Other seasonal allergic rhinitis: Secondary | ICD-10-CM | POA: Diagnosis not present

## 2021-08-01 DIAGNOSIS — Z299 Encounter for prophylactic measures, unspecified: Secondary | ICD-10-CM | POA: Diagnosis not present

## 2021-08-01 DIAGNOSIS — I10 Essential (primary) hypertension: Secondary | ICD-10-CM | POA: Diagnosis not present

## 2021-08-01 DIAGNOSIS — M7021 Olecranon bursitis, right elbow: Secondary | ICD-10-CM | POA: Diagnosis not present

## 2021-08-03 ENCOUNTER — Other Ambulatory Visit: Payer: Self-pay

## 2021-08-03 ENCOUNTER — Inpatient Hospital Stay (HOSPITAL_COMMUNITY)
Admission: EM | Admit: 2021-08-03 | Discharge: 2021-08-06 | DRG: 871 | Disposition: A | Discharge status: Home Health | Payer: PPO | Attending: Internal Medicine | Admitting: Internal Medicine

## 2021-08-03 ENCOUNTER — Encounter (HOSPITAL_COMMUNITY): Payer: Self-pay | Admitting: Emergency Medicine

## 2021-08-03 ENCOUNTER — Emergency Department (HOSPITAL_COMMUNITY): Payer: PPO

## 2021-08-03 DIAGNOSIS — R4182 Altered mental status, unspecified: Secondary | ICD-10-CM | POA: Diagnosis not present

## 2021-08-03 DIAGNOSIS — Z86010 Personal history of colonic polyps: Secondary | ICD-10-CM

## 2021-08-03 DIAGNOSIS — Z7984 Long term (current) use of oral hypoglycemic drugs: Secondary | ICD-10-CM | POA: Diagnosis not present

## 2021-08-03 DIAGNOSIS — I480 Paroxysmal atrial fibrillation: Secondary | ICD-10-CM | POA: Diagnosis present

## 2021-08-03 DIAGNOSIS — I2510 Atherosclerotic heart disease of native coronary artery without angina pectoris: Secondary | ICD-10-CM | POA: Diagnosis not present

## 2021-08-03 DIAGNOSIS — Z7901 Long term (current) use of anticoagulants: Secondary | ICD-10-CM

## 2021-08-03 DIAGNOSIS — I213 ST elevation (STEMI) myocardial infarction of unspecified site: Secondary | ICD-10-CM | POA: Diagnosis not present

## 2021-08-03 DIAGNOSIS — Z79890 Hormone replacement therapy (postmenopausal): Secondary | ICD-10-CM | POA: Diagnosis not present

## 2021-08-03 DIAGNOSIS — E119 Type 2 diabetes mellitus without complications: Secondary | ICD-10-CM

## 2021-08-03 DIAGNOSIS — E890 Postprocedural hypothyroidism: Secondary | ICD-10-CM | POA: Diagnosis not present

## 2021-08-03 DIAGNOSIS — R6520 Severe sepsis without septic shock: Secondary | ICD-10-CM | POA: Diagnosis not present

## 2021-08-03 DIAGNOSIS — Z794 Long term (current) use of insulin: Secondary | ICD-10-CM | POA: Diagnosis not present

## 2021-08-03 DIAGNOSIS — R509 Fever, unspecified: Secondary | ICD-10-CM | POA: Diagnosis not present

## 2021-08-03 DIAGNOSIS — R9431 Abnormal electrocardiogram [ECG] [EKG]: Secondary | ICD-10-CM | POA: Diagnosis not present

## 2021-08-03 DIAGNOSIS — K219 Gastro-esophageal reflux disease without esophagitis: Secondary | ICD-10-CM | POA: Diagnosis present

## 2021-08-03 DIAGNOSIS — Z8249 Family history of ischemic heart disease and other diseases of the circulatory system: Secondary | ICD-10-CM

## 2021-08-03 DIAGNOSIS — R0689 Other abnormalities of breathing: Secondary | ICD-10-CM | POA: Diagnosis not present

## 2021-08-03 DIAGNOSIS — E039 Hypothyroidism, unspecified: Secondary | ICD-10-CM | POA: Diagnosis present

## 2021-08-03 DIAGNOSIS — Z20822 Contact with and (suspected) exposure to covid-19: Secondary | ICD-10-CM | POA: Diagnosis not present

## 2021-08-03 DIAGNOSIS — R300 Dysuria: Secondary | ICD-10-CM | POA: Diagnosis not present

## 2021-08-03 DIAGNOSIS — J9601 Acute respiratory failure with hypoxia: Secondary | ICD-10-CM | POA: Diagnosis present

## 2021-08-03 DIAGNOSIS — R0602 Shortness of breath: Secondary | ICD-10-CM | POA: Diagnosis not present

## 2021-08-03 DIAGNOSIS — I452 Bifascicular block: Secondary | ICD-10-CM | POA: Diagnosis present

## 2021-08-03 DIAGNOSIS — J189 Pneumonia, unspecified organism: Secondary | ICD-10-CM | POA: Diagnosis not present

## 2021-08-03 DIAGNOSIS — N400 Enlarged prostate without lower urinary tract symptoms: Secondary | ICD-10-CM | POA: Diagnosis not present

## 2021-08-03 DIAGNOSIS — A4151 Sepsis due to Escherichia coli [E. coli]: Principal | ICD-10-CM | POA: Diagnosis present

## 2021-08-03 DIAGNOSIS — J9600 Acute respiratory failure, unspecified whether with hypoxia or hypercapnia: Secondary | ICD-10-CM | POA: Diagnosis not present

## 2021-08-03 DIAGNOSIS — Z85828 Personal history of other malignant neoplasm of skin: Secondary | ICD-10-CM | POA: Diagnosis not present

## 2021-08-03 DIAGNOSIS — R456 Violent behavior: Secondary | ICD-10-CM | POA: Diagnosis not present

## 2021-08-03 DIAGNOSIS — I10 Essential (primary) hypertension: Secondary | ICD-10-CM | POA: Diagnosis not present

## 2021-08-03 DIAGNOSIS — A419 Sepsis, unspecified organism: Principal | ICD-10-CM | POA: Diagnosis present

## 2021-08-03 DIAGNOSIS — Z79899 Other long term (current) drug therapy: Secondary | ICD-10-CM

## 2021-08-03 DIAGNOSIS — I6381 Other cerebral infarction due to occlusion or stenosis of small artery: Secondary | ICD-10-CM | POA: Diagnosis not present

## 2021-08-03 LAB — CBC WITH DIFFERENTIAL/PLATELET
Abs Immature Granulocytes: 0.1 10*3/uL — ABNORMAL HIGH (ref 0.00–0.07)
Basophils Absolute: 0 10*3/uL (ref 0.0–0.1)
Basophils Relative: 0 %
Eosinophils Absolute: 0.1 10*3/uL (ref 0.0–0.5)
Eosinophils Relative: 1 %
HCT: 37.3 % — ABNORMAL LOW (ref 39.0–52.0)
Hemoglobin: 11.7 g/dL — ABNORMAL LOW (ref 13.0–17.0)
Lymphocytes Relative: 4 %
Lymphs Abs: 0.2 10*3/uL — ABNORMAL LOW (ref 0.7–4.0)
MCH: 26.7 pg (ref 26.0–34.0)
MCHC: 31.4 g/dL (ref 30.0–36.0)
MCV: 85.2 fL (ref 80.0–100.0)
Metamyelocytes Relative: 1 %
Monocytes Absolute: 0 10*3/uL — ABNORMAL LOW (ref 0.1–1.0)
Monocytes Relative: 0 %
Myelocytes: 1 %
Neutro Abs: 5.7 10*3/uL (ref 1.7–7.7)
Neutrophils Relative %: 93 %
Platelets: 246 10*3/uL (ref 150–400)
RBC: 4.38 MIL/uL (ref 4.22–5.81)
RDW: 14.3 % (ref 11.5–15.5)
WBC: 6.1 10*3/uL (ref 4.0–10.5)
nRBC: 0 % (ref 0.0–0.2)
nRBC: 0 /100 WBC

## 2021-08-03 LAB — URINALYSIS, ROUTINE W REFLEX MICROSCOPIC
Bilirubin Urine: NEGATIVE
Glucose, UA: 50 mg/dL — AB
Ketones, ur: NEGATIVE mg/dL
Nitrite: NEGATIVE
Protein, ur: NEGATIVE mg/dL
Specific Gravity, Urine: 1.009 (ref 1.005–1.030)
pH: 8 (ref 5.0–8.0)

## 2021-08-03 LAB — COMPREHENSIVE METABOLIC PANEL
ALT: 15 U/L (ref 0–44)
AST: 17 U/L (ref 15–41)
Albumin: 3.6 g/dL (ref 3.5–5.0)
Alkaline Phosphatase: 117 U/L (ref 38–126)
Anion gap: 9 (ref 5–15)
BUN: 14 mg/dL (ref 8–23)
CO2: 26 mmol/L (ref 22–32)
Calcium: 9.6 mg/dL (ref 8.9–10.3)
Chloride: 103 mmol/L (ref 98–111)
Creatinine, Ser: 1.04 mg/dL (ref 0.61–1.24)
GFR, Estimated: >60 mL/min (ref >60)
Glucose, Bld: 162 mg/dL — ABNORMAL HIGH (ref 70–99)
Potassium: 3.6 mmol/L (ref 3.5–5.1)
Sodium: 138 mmol/L (ref 135–145)
Total Bilirubin: 0.6 mg/dL (ref 0.3–1.2)
Total Protein: 6.3 g/dL — ABNORMAL LOW (ref 6.5–8.1)

## 2021-08-03 LAB — I-STAT VENOUS BLOOD GAS, ED
Acid-Base Excess: 3 mmol/L — ABNORMAL HIGH (ref 0.0–2.0)
Bicarbonate: 27.3 mmol/L (ref 20.0–28.0)
Calcium, Ion: 1.2 mmol/L (ref 1.15–1.40)
HCT: 38 % — ABNORMAL LOW (ref 39.0–52.0)
Hemoglobin: 12.9 g/dL — ABNORMAL LOW (ref 13.0–17.0)
O2 Saturation: 67 %
Potassium: 3.6 mmol/L (ref 3.5–5.1)
Sodium: 139 mmol/L (ref 135–145)
TCO2: 29 mmol/L (ref 22–32)
pCO2, Ven: 41.8 mmHg — ABNORMAL LOW (ref 44–60)
pH, Ven: 7.423 (ref 7.25–7.43)
pO2, Ven: 34 mmHg (ref 32–45)

## 2021-08-03 LAB — TROPONIN I (HIGH SENSITIVITY)
Troponin I (High Sensitivity): 28 ng/L — ABNORMAL HIGH (ref <18)
Troponin I (High Sensitivity): 47 ng/L — ABNORMAL HIGH (ref <18)

## 2021-08-03 LAB — AMMONIA: Ammonia: <10 umol/L (ref 9–35)

## 2021-08-03 LAB — RESP PANEL BY RT-PCR (FLU A&B, COVID) ARPGX2
Influenza A by PCR: NEGATIVE
Influenza B by PCR: NEGATIVE
SARS Coronavirus 2 by RT PCR: NEGATIVE

## 2021-08-03 MED ORDER — ONDANSETRON 4 MG PO TBDP
4.0000 mg | ORAL_TABLET | Freq: Once | ORAL | Status: AC
  Administered 2021-08-03: 4 mg via ORAL
  Filled 2021-08-03: qty 1

## 2021-08-03 MED ORDER — HYDRALAZINE HCL 25 MG PO TABS
25.0000 mg | ORAL_TABLET | Freq: Once | ORAL | Status: AC
Start: 2021-08-03 — End: 2021-08-03
  Administered 2021-08-03: 25 mg via ORAL
  Filled 2021-08-03: qty 1

## 2021-08-03 MED ORDER — LACTATED RINGERS IV BOLUS
1000.0000 mL | Freq: Once | INTRAVENOUS | Status: AC
  Administered 2021-08-03: 1000 mL via INTRAVENOUS

## 2021-08-03 MED ORDER — ACETAMINOPHEN 325 MG PO TABS
650.0000 mg | ORAL_TABLET | Freq: Once | ORAL | Status: AC
  Administered 2021-08-03: 650 mg via ORAL
  Filled 2021-08-03: qty 2

## 2021-08-03 MED ORDER — CARVEDILOL 12.5 MG PO TABS
12.5000 mg | ORAL_TABLET | Freq: Once | ORAL | Status: AC
  Administered 2021-08-03: 12.5 mg via ORAL
  Filled 2021-08-03: qty 1

## 2021-08-03 MED ORDER — SODIUM CHLORIDE 0.9 % IV SOLN
500.0000 mg | INTRAVENOUS | Status: DC
  Administered 2021-08-03 – 2021-08-04 (×2): 500 mg via INTRAVENOUS
  Filled 2021-08-03 (×3): qty 5

## 2021-08-03 MED ORDER — SODIUM CHLORIDE 0.9 % IV SOLN
1.0000 g | INTRAVENOUS | Status: DC
  Administered 2021-08-03: 1 g via INTRAVENOUS
  Filled 2021-08-03: qty 10

## 2021-08-03 NOTE — ED Triage Notes (Signed)
Pt BIB [facility] from home, per friend, pt normally A&Ox4, ambulatory with a cane, this afternoon after coming home from a funeral, pt c/o feeling feverish, and was altered. On EMS arrival, pt hypertensive and c/o shortness of breath On arrival to ED, pt slow to respond, but answering questions appropriately.Given 1NTG pta.

## 2021-08-04 DIAGNOSIS — J9601 Acute respiratory failure with hypoxia: Secondary | ICD-10-CM | POA: Diagnosis not present

## 2021-08-04 DIAGNOSIS — E119 Type 2 diabetes mellitus without complications: Secondary | ICD-10-CM

## 2021-08-04 DIAGNOSIS — A419 Sepsis, unspecified organism: Secondary | ICD-10-CM | POA: Diagnosis not present

## 2021-08-04 DIAGNOSIS — I2510 Atherosclerotic heart disease of native coronary artery without angina pectoris: Secondary | ICD-10-CM

## 2021-08-04 DIAGNOSIS — J189 Pneumonia, unspecified organism: Secondary | ICD-10-CM | POA: Diagnosis not present

## 2021-08-04 DIAGNOSIS — R6520 Severe sepsis without septic shock: Secondary | ICD-10-CM

## 2021-08-04 DIAGNOSIS — I480 Paroxysmal atrial fibrillation: Secondary | ICD-10-CM

## 2021-08-04 DIAGNOSIS — E039 Hypothyroidism, unspecified: Secondary | ICD-10-CM

## 2021-08-04 DIAGNOSIS — I10 Essential (primary) hypertension: Secondary | ICD-10-CM

## 2021-08-04 DIAGNOSIS — J9600 Acute respiratory failure, unspecified whether with hypoxia or hypercapnia: Secondary | ICD-10-CM

## 2021-08-04 LAB — BLOOD CULTURE ID PANEL (REFLEXED) - BCID2

## 2021-08-04 LAB — CBC
HCT: 31.9 % — ABNORMAL LOW (ref 39.0–52.0)
Hemoglobin: 10.1 g/dL — ABNORMAL LOW (ref 13.0–17.0)
MCH: 26.4 pg (ref 26.0–34.0)
MCHC: 31.7 g/dL (ref 30.0–36.0)
MCV: 83.3 fL (ref 80.0–100.0)
Platelets: 221 10*3/uL (ref 150–400)
RBC: 3.83 MIL/uL — ABNORMAL LOW (ref 4.22–5.81)
RDW: 14.4 % (ref 11.5–15.5)
WBC: 15.9 10*3/uL — ABNORMAL HIGH (ref 4.0–10.5)
nRBC: 0 % (ref 0.0–0.2)

## 2021-08-04 LAB — BLOOD GAS, VENOUS
Acid-Base Excess: 4 mmol/L — ABNORMAL HIGH (ref 0.0–2.0)
Bicarbonate: 30.6 mmol/L — ABNORMAL HIGH (ref 20.0–28.0)
Drawn by: 6939
O2 Saturation: 57.7 %
Patient temperature: 37
pCO2, Ven: 53 mmHg (ref 44–60)
pH, Ven: 7.37 (ref 7.25–7.43)
pO2, Ven: 31 mmHg — CL (ref 32–45)

## 2021-08-04 LAB — GLUCOSE, CAPILLARY
Glucose-Capillary: 129 mg/dL — ABNORMAL HIGH (ref 70–99)
Glucose-Capillary: 111 mg/dL — ABNORMAL HIGH (ref 70–99)
Glucose-Capillary: 140 mg/dL — ABNORMAL HIGH (ref 70–99)
Glucose-Capillary: 184 mg/dL — ABNORMAL HIGH (ref 70–99)

## 2021-08-04 LAB — CBG MONITORING, ED: Glucose-Capillary: 303 mg/dL — ABNORMAL HIGH (ref 70–99)

## 2021-08-04 MED ORDER — DILTIAZEM HCL ER COATED BEADS 120 MG PO CP24
240.0000 mg | ORAL_CAPSULE | Freq: Every day | ORAL | Status: DC
  Administered 2021-08-04 – 2021-08-06 (×3): 240 mg via ORAL
  Filled 2021-08-04: qty 1
  Filled 2021-08-04 (×3): qty 2

## 2021-08-04 MED ORDER — CITALOPRAM HYDROBROMIDE 20 MG PO TABS
20.0000 mg | ORAL_TABLET | Freq: Every day | ORAL | Status: DC
Start: 2021-08-04 — End: 2021-08-06
  Administered 2021-08-04 – 2021-08-05 (×2): 20 mg via ORAL
  Filled 2021-08-04 (×2): qty 1

## 2021-08-04 MED ORDER — SODIUM CHLORIDE 0.9 % IV SOLN
2.0000 g | INTRAVENOUS | Status: DC
  Administered 2021-08-04 – 2021-08-05 (×2): 2 g via INTRAVENOUS
  Filled 2021-08-04: qty 20

## 2021-08-04 MED ORDER — LEVOTHYROXINE SODIUM 25 MCG PO TABS
125.0000 ug | ORAL_TABLET | Freq: Every day | ORAL | Status: DC
  Administered 2021-08-04 – 2021-08-06 (×3): 125 ug via ORAL
  Filled 2021-08-04 (×3): qty 1

## 2021-08-04 MED ORDER — VITAMIN B-12 1000 MCG PO TABS
1000.0000 ug | ORAL_TABLET | Freq: Every day | ORAL | Status: DC
  Administered 2021-08-04 – 2021-08-06 (×3): 1000 ug via ORAL
  Filled 2021-08-04 (×3): qty 1

## 2021-08-04 MED ORDER — PANTOPRAZOLE SODIUM 40 MG PO TBEC
40.0000 mg | DELAYED_RELEASE_TABLET | Freq: Every day | ORAL | Status: DC
  Administered 2021-08-04 – 2021-08-06 (×3): 40 mg via ORAL
  Filled 2021-08-04 (×3): qty 1

## 2021-08-04 MED ORDER — AMLODIPINE BESYLATE 10 MG PO TABS
10.0000 mg | ORAL_TABLET | Freq: Every day | ORAL | Status: DC
  Administered 2021-08-04 – 2021-08-06 (×3): 10 mg via ORAL
  Filled 2021-08-04 (×3): qty 1

## 2021-08-04 MED ORDER — SODIUM CHLORIDE 0.9 % IV SOLN: INTRAVENOUS | Status: AC

## 2021-08-04 MED ORDER — RIVAROXABAN 15 MG PO TABS
15.0000 mg | ORAL_TABLET | Freq: Every day | ORAL | Status: DC
  Administered 2021-08-04: 15 mg via ORAL
  Filled 2021-08-04: qty 1

## 2021-08-04 MED ORDER — DOCUSATE SODIUM 100 MG PO CAPS
100.0000 mg | ORAL_CAPSULE | Freq: Every evening | ORAL | Status: DC
  Administered 2021-08-04 – 2021-08-06 (×3): 100 mg via ORAL
  Filled 2021-08-04 (×3): qty 1

## 2021-08-04 MED ORDER — ZINC SULFATE 220 (50 ZN) MG PO CAPS
220.0000 mg | ORAL_CAPSULE | Freq: Every day | ORAL | Status: DC
  Administered 2021-08-04 – 2021-08-06 (×3): 220 mg via ORAL
  Filled 2021-08-04 (×3): qty 1

## 2021-08-04 MED ORDER — FAMOTIDINE 20 MG PO TABS
20.0000 mg | ORAL_TABLET | Freq: Every day | ORAL | Status: DC
  Administered 2021-08-04 – 2021-08-06 (×3): 20 mg via ORAL
  Filled 2021-08-04 (×3): qty 1

## 2021-08-04 MED ORDER — GABAPENTIN 300 MG PO CAPS
300.0000 mg | ORAL_CAPSULE | Freq: Three times a day (TID) | ORAL | Status: DC
  Administered 2021-08-04 – 2021-08-06 (×8): 300 mg via ORAL
  Filled 2021-08-04 (×8): qty 1

## 2021-08-04 MED ORDER — MELATONIN 5 MG PO TABS
10.0000 mg | ORAL_TABLET | Freq: Every day | ORAL | Status: DC
Start: 2021-08-04 — End: 2021-08-06
  Administered 2021-08-04 – 2021-08-05 (×2): 10 mg via ORAL
  Filled 2021-08-04 (×2): qty 2

## 2021-08-04 MED ORDER — VITAMIN D 25 MCG (1000 UNIT) PO TABS
1000.0000 [IU] | ORAL_TABLET | Freq: Every evening | ORAL | Status: DC
  Administered 2021-08-04 – 2021-08-06 (×3): 1000 [IU] via ORAL
  Filled 2021-08-04 (×3): qty 1

## 2021-08-04 MED ORDER — TAMSULOSIN HCL 0.4 MG PO CAPS
0.4000 mg | ORAL_CAPSULE | Freq: Every day | ORAL | Status: DC
  Administered 2021-08-04 – 2021-08-06 (×3): 0.4 mg via ORAL
  Filled 2021-08-04 (×3): qty 1

## 2021-08-04 MED ORDER — INSULIN ASPART 100 UNIT/ML IJ SOLN
0.0000 [IU] | Freq: Three times a day (TID) | INTRAMUSCULAR | Status: DC
  Administered 2021-08-04 – 2021-08-05 (×3): 1 [IU] via SUBCUTANEOUS
  Administered 2021-08-05 (×2): 2 [IU] via SUBCUTANEOUS
  Administered 2021-08-06: 1 [IU] via SUBCUTANEOUS
  Administered 2021-08-06: 3 [IU] via SUBCUTANEOUS
  Administered 2021-08-06: 2 [IU] via SUBCUTANEOUS

## 2021-08-04 MED ORDER — CARVEDILOL 12.5 MG PO TABS
12.5000 mg | ORAL_TABLET | Freq: Two times a day (BID) | ORAL | Status: DC
  Administered 2021-08-04 – 2021-08-05 (×3): 12.5 mg via ORAL
  Filled 2021-08-04 (×3): qty 1

## 2021-08-04 MED ORDER — ATORVASTATIN CALCIUM 10 MG PO TABS
10.0000 mg | ORAL_TABLET | Freq: Every day | ORAL | Status: DC
  Administered 2021-08-04 – 2021-08-06 (×3): 10 mg via ORAL
  Filled 2021-08-04 (×3): qty 1

## 2021-08-04 NOTE — Assessment & Plan Note (Addendum)
Secondary to community-acquired pneumonia.  Patient presented with fever, tachycardia and tachypnea requiring 4 L via nasal cannula with chest x-ray finding of possible infiltrate to the left lung base. -Continue IV Rocephin and azithromycin -Continuous IV fluids overnight

## 2021-08-04 NOTE — Assessment & Plan Note (Signed)
Continue levothyroxine 

## 2021-08-04 NOTE — Assessment & Plan Note (Signed)
Continue home amlodipine, diltiazem and Coreg

## 2021-08-04 NOTE — Progress Notes (Signed)
NEW ADMISSION NOTE New Admission Note:   Arrival Method: Stretcher via ED Mental Orientation: Alert and Oriented x3 Telemetry: Telebox #9 Assessment:  Initiated  Skin: Intact IV: Left forearm, saline locked Pain: 0/10 Tubes: None Safety Measures: Safety Fall Prevention Plan has been given, discussed and signed Admission: Initiated [facility] [place] Orientation: Patient has been orientated to the room, unit and staff.  Family: Not present at the bedside  Orders have been reviewed and implemented. Will continue to monitor the patient. Call light has been placed within reach and bed alarm has been activated.   Sharmon Revere, RN

## 2021-08-04 NOTE — Assessment & Plan Note (Signed)
Continue Xarelto 

## 2021-08-04 NOTE — Progress Notes (Signed)
Progress Note   Patient: Brett Matthews:[id number] DOB: 1932-03-28 DOA: 08/03/2021     1 DOS: the patient was seen and examined on 08/04/2021   Brief hospital course: 88 y.o. male with medical history significant of paroxysmal atrial fibrillation on Xarelto, hypertension, hypothyroidism, type 2 diabetes, BPH who presents due to concerns of rigors.   Patient is a difficult historian as he is hard of hearing.  Really patient was out in the sun today for a funeral and afterwards at home he developed sudden onset rigors.  He reports that he was otherwise in his normal state of health.  He denies any coughing, chest pain or shortness of breath.  No nausea, vomiting or diarrhea.  No fever.   Initially patient presented as a code STEMI was activated by EMS after reviewing monitoring but without obtaining an actual twelve-lead EKG.  This was revealed by cardiology on arrival and was canceled.  EKG shows right bundle branch block and left anterior fascicular block. He was noted to be febrile up to 101.4, tachycardic and tachypneic requiring 4 L via nasal cannula.  Also hypertensive up to 197/97 which improved after 25 mg of oral hydralazine in the ED.   CBC without leukocytosis, hemoglobin of 12.9.  No significant electrolyte abnormalities.  Normal LFTs.  CT head was obtained which was negative.  Chest x-ray showed concerns of mild atelectasis versus infiltrate in the left lateral lung base.  He was then started on IV Rocephin and azithromycin.  Hospitalist then consulted for further management.   Assessment and Plan: * Sepsis secondary to possible PNA and ecoli bacteremia ([place]) -Secondary to community-acquired pneumonia.  Patient presented with fever, tachycardia and tachypnea requiring 4 L via nasal cannula with chest x-ray finding of possible infiltrate to the left lung base. -Continue IV Rocephin and azithromycin -will f/u on urine cx -wean o2 as tolerated   Essential hypertension,  benign -Continue home amlodipine, diltiazem and Coreg -BP stable   Type 2 diabetes mellitus without complication, without long-term current use of insulin (HCC) -Cont SIS coverage as needeed   Paroxysmal atrial fibrillation (HCC) Continue diltiazem and Xarelto   Acute respiratory failure with hypoxia (HCC) Secondary to community-acquired.  Requiring 4 L due to tachypnea. - Continue IV Rocephin and azithromycin - Maintain O2 greater than 92% and wean as tolerated -Wean O2 as tolerated   CAD, NATIVE VESSEL Continue Xarelto   Hypothyroidism Continue levothyroxine      Subjective: Pleasantly confused this AM  Physical Exam: Vitals:   08/04/21 0151 08/04/21 0449 08/04/21 0451 08/04/21 0924  BP:   137/79 (!) 133/95  Pulse:   91 77  Resp:   18 18  Temp: 100 F (37.8 C)  98.4 F (36.9 C) 98.3 F (36.8 C)  TempSrc: Rectal  Oral   SpO2:   100% 95%  Weight:  89.5 kg    Height:  '5\' 9"'$  (1.753 m)     General exam: Awake, sitting in chair, in nad Respiratory system: Normal respiratory effort, no wheezing Cardiovascular system: regular rate, s1, s2 Gastrointestinal system: Soft, nondistended, positive BS Central nervous system: CN2-12 grossly intact, strength intact Extremities: Perfused, no clubbing Skin: Normal skin turgor, no notable skin lesions seen Psychiatry: Mood normal // no visual hallucinations   Data Reviewed:  There are no new results to review at this time.  Family Communication: Pt in room, family not at bedside  Disposition: Status is: Inpatient Remains inpatient appropriate because: Severity of illness  Planned Discharge Destination:  Unclear at this time    Author: Marylu Lund, MD 08/04/2021 4:04 PM  For on call review [email or web address].

## 2021-08-04 NOTE — Hospital Course (Signed)
88 y.o. male with medical history significant of paroxysmal atrial fibrillation on Xarelto, hypertension, hypothyroidism, type 2 diabetes, BPH who presents due to concerns of rigors.   Patient is a difficult historian as he is hard of hearing.  Really patient was out in the sun today for a funeral and afterwards at home he developed sudden onset rigors.  He reports that he was otherwise in his normal state of health.  He denies any coughing, chest pain or shortness of breath.  No nausea, vomiting or diarrhea.  No fever.   Initially patient presented as a code STEMI was activated by EMS after reviewing monitoring but without obtaining an actual twelve-lead EKG.  This was revealed by cardiology on arrival and was canceled.  EKG shows right bundle branch block and left anterior fascicular block. He was noted to be febrile up to 101.4, tachycardic and tachypneic requiring 4 L via nasal cannula.  Also hypertensive up to 197/97 which improved after 25 mg of oral hydralazine in the ED.   CBC without leukocytosis, hemoglobin of 12.9.  No significant electrolyte abnormalities.  Normal LFTs.  CT head was obtained which was negative.  Chest x-ray showed concerns of mild atelectasis versus infiltrate in the left lateral lung base.  He was then started on IV Rocephin and azithromycin.  Hospitalist then consulted for further management.

## 2021-08-04 NOTE — Progress Notes (Signed)
PHARMACY - PHYSICIAN COMMUNICATION CRITICAL VALUE ALERT - BLOOD CULTURE IDENTIFICATION (BCID)  QUASHON JESUS is an 88 y.o. male who presented to [facility] on 08/03/2021 with a chief complaint of altered mental status/sepsis.   Assessment:  1 out of 4 blood cultures with GNR. BCID showing EColi, no resistance. WBC is elevated. Tmax 101.4.   Name of physician (or Provider) Contacted: Dr. Wyline Copas  Current antibiotics: Ceftiraxone 1g IV every 24 hours and Azithromycin for CAP coverage.   Changes to prescribed antibiotics recommended:  Increased Ceftriaxone to 2g IV every 24 hours   No results found for this or any previous visit.  Brain Hilts 08/04/2021  3:18 PM

## 2021-08-04 NOTE — Assessment & Plan Note (Signed)
Secondary to community-acquired.  Requiring 4 L due to tachypnea. - Continue IV Rocephin and azithromycin - Maintain O2 greater than 92% and wean as tolerated

## 2021-08-04 NOTE — ED Notes (Signed)
Caregiver/employee Laural Golden [phone] would like an update asap

## 2021-08-04 NOTE — Assessment & Plan Note (Signed)
-   Continue diltiazem and Xarelto 

## 2021-08-04 NOTE — H&P (Signed)
History and Physical    Patient: Brett Matthews WPV:[id number] DOB: 1932-07-24 DOA: 08/03/2021 DOS: the patient was seen and examined on 08/04/2021 PCP: Glenda Chroman, MD  Patient coming from: Home  Chief Complaint:  Chief Complaint  Patient presents with   Altered Mental Status   HPI: Brett Matthews is a 88 y.o. male with medical history significant of paroxysmal atrial fibrillation on Xarelto, hypertension, hypothyroidism, type 2 diabetes, BPH who presents due to concerns of rigors.  Patient is a difficult historian as he is hard of hearing.  Really patient was out in the sun today for a funeral and afterwards at home he developed sudden onset rigors.  He reports that he was otherwise in his normal state of health.  He denies any coughing, chest pain or shortness of breath.  No nausea, vomiting or diarrhea.  No fever.  Initially patient presented as a code STEMI was activated by EMS after reviewing monitoring but without obtaining an actual twelve-lead EKG.  This was revealed by cardiology on arrival and was canceled.  EKG shows right bundle branch block and left anterior fascicular block. He was noted to be febrile up to 101.4, tachycardic and tachypneic requiring 4 L via nasal cannula.  Also hypertensive up to 197/97 which improved after 25 mg of oral hydralazine in the ED.  CBC without leukocytosis, hemoglobin of 12.9.  No significant electrolyte abnormalities.  Normal LFTs.  CT head was obtained which was negative.  Chest x-ray showed concerns of mild atelectasis versus infiltrate in the left lateral lung base.  He was then started on IV Rocephin and azithromycin.  Hospitalist then consulted for further management.  Review of Systems: As mentioned in the history of present illness. All other systems reviewed and are negative. Past Medical History:  Diagnosis Date   Anemia, iron deficiency    Negative capsule endoscopy   Arthritis    Coronary atherosclerosis of native coronary  artery    60 % circumflex stenosis; EF 65%, Cath 6/13   Diverticula of colon    Pancolonic   DM (diabetes mellitus), type 2, uncontrolled    Dyspnea    Normal cardiopulmonary function test in July 2008, negative echocardiogram with "bubble" study for inter-cardiac shunt.   Essential hypertension, benign    Gastroesophageal reflux disease    Hemorrhoids    Hyperplastic colon polyp 04/06/2007   Hypothyroidism 2003   Status post total thyroidectomy   Memory difficulties    Migraines    Neoplasm of lymphatic and hematopoietic tissue    Paroxysmal atrial fibrillation ([place])    Diagnosed 2005   Skin cancer    Past Surgical History:  Procedure Laterality Date   BACK SURGERY     BONE MARROW BIOPSY  1990's   CARDIAC CATHETERIZATION     CARDIOVERSION N/A 01/18/2019   Procedure: CARDIOVERSION;  Surgeon: Adrian Prows, MD;  Location: [facility];  Service: Cardiovascular;  Laterality: N/A;   CARDIOVERSION N/A 03/20/2020   Procedure: CARDIOVERSION;  Surgeon: Adrian Prows, MD;  Location: [place];  Service: Cardiovascular;  Laterality: N/A;   CATARACT EXTRACTION Bilateral    COLONOSCOPY  04/06/2007   Dr. Gala Romney- Normal rectum with scattered pancolonic diverticula and slightly redundant elongated colon, diminutive polpy midsigmoid, remainder of colonic mucosa appeared normal. bx= hyperplastic polyp   COLONOSCOPY N/A 11/29/2012   ZSM:OLMBEMLJ preparation. Friable anal canal/internal hemorrhoids; otherwise, normal rectum. Normal-appearing colonic mucosa   COLONOSCOPY WITH PROPOFOL N/A 03/12/2017   Procedure: COLONOSCOPY WITH PROPOFOL;  Surgeon: Manus Rudd  M, MD;  [facility];  Service: Endoscopy;  Laterality: N/A;  7:30am   ESOPHAGOGASTRODUODENOSCOPY  04/06/2007   Dr. Gala Romney- normal esophagus s/p nissen fundoplication, intact nissen wrap o/w normal stomach   ESOPHAGOGASTRODUODENOSCOPY N/A 11/29/2012   RMR: Abnormall distal esophagus bx c/w GERD. prior fundoplication. Gastric polyps  -bx  benign. Status post biopsy of normal--appearing duodenal mucosa (bx neg)   HEMORRHOID SURGERY N/A 04/06/2017   Procedure: EXTENSIVE HEMORRHOIDECTOMY;  Surgeon: Aviva Signs, MD;  Location: [facility];  Service: General;  Laterality: N/A;   LEFT HEART CATHETERIZATION WITH CORONARY ANGIOGRAM N/A 06/27/2011   Procedure: LEFT HEART CATHETERIZATION WITH CORONARY ANGIOGRAM;  Surgeon: Thayer Headings, MD;  Location: [facility] CATH [facility];  Service: Cardiovascular;  Laterality: N/A;   NISSEN FUNDOPLICATION     ORIF ANKLE FRACTURE Right 03/19/2021   Procedure: OPEN REDUCTION INTERNAL FIXATION (ORIF) ANKLE FRACTURE, COLATERAL ANKLE LIGAMENT REPAIR;  Surgeon: Renette Butters, MD;  Location: [facility];  Service: Orthopedics;  Laterality: Right;   POLYPECTOMY  03/12/2017   Procedure: POLYPECTOMY;  Surgeon: Daneil Dolin, MD;  Location: [facility];  Service: Endoscopy;;  cecal x3   POSTERIOR LAMINECTOMY / DECOMPRESSION LUMBAR SPINE  ~ 1990's   SKIN CANCER EXCISION     "off my back and chest; from sun"   Social History:  reports that he has never smoked. He has never used smokeless tobacco. He reports that he does not drink alcohol and does not use drugs.  No Known Allergies  Family History  Problem Relation Age of Onset   Hypertension Mother    Stroke Mother    Cancer Father    Cancer Sister    Stroke Brother    Cancer Other    Stroke Other    Colon cancer Neg Hx     Prior to Admission medications   Medication Sig Start Date End Date Taking? Authorizing Provider  acetaminophen (TYLENOL) 500 MG tablet Take 2 tablets (1,000 mg total) by mouth every 6 (six) hours as needed for mild pain or moderate pain. 03/22/21   Britt Bottom, PA-C  albuterol (VENTOLIN HFA) 108 (90 Base) MCG/ACT inhaler Inhale 1-2 puffs into the lungs every 6 (six) hours as needed for wheezing or shortness of breath.    [provider]  atorvastatin (LIPITOR) 10 MG tablet Take 1 tablet (10 mg total) by mouth daily. 02/02/20   Adrian Prows, MD  carvedilol (COREG) 12.5 MG tablet Take 12.5 mg by mouth 2 (two) times daily with a meal.    [provider]  Cholecalciferol (D3-1000) 25 MCG (1000 UT) capsule Take 1,000 Units by mouth every evening.    [provider]  citalopram (CELEXA) 20 MG tablet Take 1 tablet (20 mg total) by mouth at bedtime. 03/22/21   Mercy Riding, MD  diltiazem (CARDIZEM CD) 240 MG 24 hr capsule Take 240 mg by mouth daily.    [provider]  docusate sodium (COLACE) 100 MG capsule Take 100 mg by mouth every evening.    [provider]  famotidine (PEPCID) 40 MG tablet Take 40 mg by mouth daily. 03/04/20   [provider]  fluticasone (FLONASE) 50 MCG/ACT nasal spray Place 1 spray into both nostrils daily as needed for allergies.    [provider]  gabapentin (NEURONTIN) 300 MG capsule Take 300 mg by mouth 3 (three) times daily.    [provider]  glimepiride (AMARYL) 2 MG tablet Take 2 mg by mouth 2 (two)  times daily. 03/10/17   [provider]  hydrALAZINE (APRESOLINE) 25 MG tablet Take 1 tablet (25 mg total) by mouth 3 (three) times daily as needed (As needed for BP >140/90). 05/21/21 09/18/21  Cantwell, Celeste C, PA-C  Insulin Degludec (TRESIBA) 100 UNIT/ML SOLN Inject 18 Units into the skin daily.    [provider]  levothyroxine (SYNTHROID) 125 MCG tablet Take 125 mcg by mouth daily at 2 am.    [provider]  Melatonin 10 MG TABS Take 10 mg by mouth at bedtime.    [provider]  metFORMIN (GLUCOPHAGE) 500 MG tablet Take 500-1,000 mg by mouth See admin instructions. Take 2 tabs (1,061m) by mouth every morning & 1 tab (5064m every evening    [provider]  olmesartan (BENICAR) 40 MG tablet Take 1 tablet (40 mg total) by mouth daily. 05/21/21   Cantwell, Celeste C, PA-C  pantoprazole (PROTONIX) 40 MG tablet Take 40 mg by mouth daily at 12 noon.    [provider]  polyethylene glycol  (MIRALAX / GLYCOLAX) 17 g packet Take 17 g by mouth daily.    [provider]  senna-docusate (SENOKOT-S) 8.6-50 MG tablet Take 1 tablet by mouth 2 (two) times daily as needed for moderate constipation. 03/22/21   GoMercy RidingMD  tamsulosin (FLOMAX) 0.4 MG CAPS capsule Take 0.4 mg by mouth daily after breakfast. 09/09/12   [provider]  vitamin B-12 (CYANOCOBALAMIN) 1000 MCG tablet Take 1,000 mcg by mouth daily.    [provider]  XARELTO 15 MG TABS tablet Take 15 mg by mouth every evening. 02/08/20   [provider]  zinc gluconate 50 MG tablet Take 50 mg by mouth daily.    [provider]    Physical Exam: Vitals:   08/03/21 2315 08/03/21 2330 08/04/21 0130 08/04/21 0151  BP: (!) 160/89 135/78 (!) 146/82   Pulse: (!) 102 (!) 103 99   Resp: (!) 23 (!) 23 18   Temp:    100 F (37.8 C)  TempSrc:    Rectal  SpO2: 97% 94% 97%    Constitutional: NAD, calm, comfortable, non-toxic elderly male laying in bed asleep Eyes: PERRL, lids and conjunctivae normal ENMT: Mucous membranes are moist. Posterior pharynx clear of any exudate or lesions.Normal dentition.  Neck: normal, supple, no masses, no thyromegaly Respiratory: clear to auscultation bilaterally, no wheezing, no crackles. Normal respiratory effort. No accessory muscle use.  Cardiovascular: Regular rate and rhythm, no murmurs / rubs / gallops. No extremity edema. 2+ pedal pulses. No carotid bruits.  Abdomen: no tenderness, no masses palpated. No hepatosplenomegaly. Bowel sounds positive.  Musculoskeletal: no clubbing / cyanosis. No joint deformity upper and lower extremities. Good ROM, no contractures. Normal muscle tone.  Skin: no rashes, lesions, ulcers. No induration Neurologic: CN 2-12 grossly intact. Sensation intact, DTR normal. Strength 5/5 in all 4.  Psychiatric: Normal judgment and insight. Alert and oriented x 3. Normal mood. Data Reviewed:  See HPI  Assessment and Plan: *  Sepsis ([facility]CDetmoldSecondary to community-acquired pneumonia.  Patient presented with fever, tachycardia and tachypnea requiring 4 L via nasal cannula with chest x-ray finding of possible infiltrate to the left lung base. -Continue IV Rocephin and azithromycin -Continuous IV fluids overnight  Essential hypertension, benign Continue home amlodipine, diltiazem and Coreg  Type 2 diabetes mellitus without complication, without long-term current use of insulin (HCNew BrightonHold home oral antidiabetics.  Place on sensitive sliding scale insulin  Paroxysmal atrial fibrillation (HCC)  Continue diltiazem and Xarelto  Acute respiratory failure with hypoxia (H[facility]) Secondary to community-acquired.  Requiring 4 L due to tachypnea. - Continue IV Rocephin and azithromycin - Maintain O2 greater than 92% and wean as tolerated  CAD, NATIVE VESSEL Continue Xarelto  Hypothyroidism Continue levothyroxine      Advance Care Planning:   Code Status: Full Code   Consults: None  Family Communication: Discussed with caregiver/employee [employer]   Severity of Illness: The appropriate patient status for this patient is INPATIENT. Inpatient status is judged to be reasonable and necessary in order to provide the required intensity of service to ensure the patient's safety. The patient's presenting symptoms, physical exam findings, and initial radiographic and laboratory data in the context of their chronic comorbidities is felt to place them at high risk for further clinical deterioration. Furthermore, it is not anticipated that the patient will be medically stable for discharge from the hospital within 2 midnights of admission.   * I certify that at the point of admission it is my clinical judgment that the patient will require inpatient hospital care spanning beyond 2 midnights from the point of admission due to high intensity of service, high risk for further deterioration and high frequency of surveillance  required.*  Author: Orene Desanctis, DO 08/04/2021 2:38 AM  For on call review [email or web address].

## 2021-08-04 NOTE — Assessment & Plan Note (Signed)
Hold home oral antidiabetics.  Place on sensitive sliding scale insulin

## 2021-08-04 NOTE — ED Provider Notes (Signed)
[facility] EMERGENCY DEPARTMENT Provider Note   CSN: [id number] Arrival date & time: 08/03/21  1935     History  Chief Complaint  Patient presents with   Altered Mental Status    Brett Matthews is a 88 y.o. male with history of diabetes, coronary artery disease, atrial fibrillation presenting with confusion.  The patient's wife reports he was out in the sun today, afterwards at home developed rigors.  She denies any other symptoms such as fevers, cough, nausea, vomiting, patient is not been complaining of abdominal pain, has not complained of any urinary symptoms.  The patient currently reports that he feels fine.  The patient currently denies any symptoms such as abdominal pain, chest pain, fevers, chills.  Notably, EMS activated as a code STEMI without obtaining EKG after reviewing monitor, but this was canceled prior to arrival after being reviewed by cardiology.   Altered Mental Status      Home Medications Prior to Admission medications   Medication Sig Start Date End Date Taking? Authorizing Provider  acetaminophen (TYLENOL) 500 MG tablet Take 2 tablets (1,000 mg total) by mouth every 6 (six) hours as needed for mild pain or moderate pain. 03/22/21   Britt Bottom, PA-C  albuterol (VENTOLIN HFA) 108 (90 Base) MCG/ACT inhaler Inhale 1-2 puffs into the lungs every 6 (six) hours as needed for wheezing or shortness of breath.    [provider]  atorvastatin (LIPITOR) 10 MG tablet Take 1 tablet (10 mg total) by mouth daily. 02/02/20   Adrian Prows, MD  carvedilol (COREG) 12.5 MG tablet Take 12.5 mg by mouth 2 (two) times daily with a meal.    [provider]  Cholecalciferol (D3-1000) 25 MCG (1000 UT) capsule Take 1,000 Units by mouth every evening.    [provider]  citalopram (CELEXA) 20 MG tablet Take 1 tablet (20 mg total) by mouth at bedtime. 03/22/21   Mercy Riding, MD  diltiazem (CARDIZEM CD) 240 MG 24 hr capsule Take 240 mg by  mouth daily.    [provider]  docusate sodium (COLACE) 100 MG capsule Take 100 mg by mouth every evening.    [provider]  famotidine (PEPCID) 40 MG tablet Take 40 mg by mouth daily. 03/04/20   [provider]  fluticasone (FLONASE) 50 MCG/ACT nasal spray Place 1 spray into both nostrils daily as needed for allergies.    [provider]  gabapentin (NEURONTIN) 300 MG capsule Take 300 mg by mouth 3 (three) times daily.    [provider]  glimepiride (AMARYL) 2 MG tablet Take 2 mg by mouth 2 (two) times daily. 03/10/17   [provider]  hydrALAZINE (APRESOLINE) 25 MG tablet Take 1 tablet (25 mg total) by mouth 3 (three) times daily as needed (As needed for BP >140/90). 05/21/21 09/18/21  Cantwell, Celeste C, PA-C  Insulin Degludec (TRESIBA) 100 UNIT/ML SOLN Inject 18 Units into the skin daily.    [provider]  levothyroxine (SYNTHROID) 125 MCG tablet Take 125 mcg by mouth daily at 2 am.    [provider]  Melatonin 10 MG TABS Take 10 mg by mouth at bedtime.    [provider]  metFORMIN (GLUCOPHAGE) 500 MG tablet Take 500-1,000 mg by mouth See admin instructions. Take 2 tabs (1,'000mg'$ ) by mouth every morning & 1 tab ('500mg'$ ) every evening    [provider]  olmesartan (BENICAR) 40 MG tablet Take 1 tablet (40 mg total) by mouth  daily. 05/21/21   Cantwell, Celeste C, PA-C  pantoprazole (PROTONIX) 40 MG tablet Take 40 mg by mouth daily at 12 noon.    [provider]  polyethylene glycol (MIRALAX / GLYCOLAX) 17 g packet Take 17 g by mouth daily.    [provider]  senna-docusate (SENOKOT-S) 8.6-50 MG tablet Take 1 tablet by mouth 2 (two) times daily as needed for moderate constipation. 03/22/21   Mercy Riding, MD  tamsulosin (FLOMAX) 0.4 MG CAPS capsule Take 0.4 mg by mouth daily after breakfast. 09/09/12   [provider]  vitamin B-12 (CYANOCOBALAMIN) 1000 MCG tablet Take 1,000 mcg by  mouth daily.    [provider]  XARELTO 15 MG TABS tablet Take 15 mg by mouth every evening. 02/08/20   [provider]  zinc gluconate 50 MG tablet Take 50 mg by mouth daily.    [provider]      Allergies    Patient has no known allergies.    Review of Systems   Review of Systems See HPI  Physical Exam Updated Vital Signs BP 135/78   Pulse (!) 103   Temp (!) 101.4 F (38.6 C) (Rectal)   Resp (!) 23   SpO2 94%  Physical Exam Vitals and nursing note reviewed.  Constitutional:      General: He is not in acute distress.    Appearance: Normal appearance.  HENT:     Mouth/Throat:     Mouth: Mucous membranes are moist.  Cardiovascular:     Rate and Rhythm: Normal rate and regular rhythm.  Pulmonary:     Effort: Pulmonary effort is normal. No respiratory distress.     Breath sounds: Normal breath sounds.  Abdominal:     General: Abdomen is flat.     Palpations: Abdomen is soft.     Tenderness: There is no abdominal tenderness.  Skin:    General: Skin is warm and dry.     Capillary Refill: Capillary refill takes less than 2 seconds.  Neurological:     Mental Status: He is alert.     Comments: Oriented to self only. Cranial nerves II through XII intact, strength 5 out of 5 in the bilateral upper and lower extremities, no sensory deficit to light touch, no dysmetria on finger-nose-finger testing   Psychiatric:        Mood and Affect: Mood normal.        Behavior: Behavior normal.     ED Results / Procedures / Treatments   Labs (all labs ordered are listed, but only abnormal results are displayed) Labs Reviewed  COMPREHENSIVE METABOLIC PANEL - Abnormal; Notable for the following components:      Result Value   Glucose, Bld 162 (*)    Total Protein 6.3 (*)    All other components within normal limits  CBC WITH DIFFERENTIAL/PLATELET - Abnormal; Notable for the following components:   Hemoglobin 11.7 (*)    HCT 37.3 (*)    Lymphs Abs 0.2  (*)    Monocytes Absolute 0.0 (*)    Abs Immature Granulocytes 0.10 (*)    All other components within normal limits  URINALYSIS, ROUTINE W REFLEX MICROSCOPIC - Abnormal; Notable for the following components:   Glucose, UA 50 (*)    Hgb urine dipstick SMALL (*)    Leukocytes,Ua TRACE (*)    Bacteria, UA FEW (*)    All other components within normal limits  I-STAT VENOUS BLOOD GAS, ED - Abnormal; Notable for the  following components:   pCO2, Ven 41.8 (*)    Acid-Base Excess 3.0 (*)    HCT 38.0 (*)    Hemoglobin 12.9 (*)    All other components within normal limits  TROPONIN I (HIGH SENSITIVITY) - Abnormal; Notable for the following components:   Troponin I (High Sensitivity) 28 (*)    All other components within normal limits  TROPONIN I (HIGH SENSITIVITY) - Abnormal; Notable for the following components:   Troponin I (High Sensitivity) 47 (*)    All other components within normal limits  RESP PANEL BY RT-PCR (FLU A&B, COVID) ARPGX2  CULTURE, BLOOD (ROUTINE X 2)  CULTURE, BLOOD (ROUTINE X 2)  AMMONIA  BLOOD GAS, VENOUS  CBG MONITORING, ED    EKG EKG Interpretation  Date/Time:  Saturday August 03 2021 19:42:59 EDT Ventricular Rate:  98 PR Interval:  203 QRS Duration: 162 QT Interval:  378 QTC Calculation: 483 R Axis:   -74 Text Interpretation: Sinus rhythm RBBB and LAFB Confirmed by Garnette Gunner [phone]) on 08/03/2021 7:57:25 PM  Radiology CT HEAD WO CONTRAST  Result Date: 08/03/2021 CLINICAL DATA:  Mental status change of unknown cause. Hypertension and shortness of breath. EXAM: CT HEAD WITHOUT CONTRAST TECHNIQUE: Contiguous axial images were obtained from the base of the skull through the vertex without intravenous contrast. RADIATION DOSE REDUCTION: This exam was performed according to the departmental dose-optimization program which includes automated exposure control, adjustment of the mA and/or kV according to patient size and/or use of iterative reconstruction  technique. COMPARISON:  03/13/2021 FINDINGS: Brain: Diffuse cerebral atrophy. Ventricular dilatation consistent with central atrophy. Low-attenuation changes in the deep white matter consistent with small vessel ischemia. Old lacunar infarct in the left internal capsule. Prominent CSF space in the right anterior frontal region may represent asymmetrical atrophy or arachnoid cyst. No change since prior study. No mass effect or midline shift. Gray-white matter junctions are distinct. Basal cisterns are not effaced. No acute intracranial hemorrhage. Vascular: Intracranial vascular calcifications. Skull: Calvarium appears intact. Sinuses/Orbits: Paranasal sinuses and mastoid air cells are clear. Other: No significant change since previous study. IMPRESSION: No acute intracranial abnormalities. Chronic atrophy and small vessel ischemic changes. Prominent CSF space in the right frontal region is unchanged since prior study suggesting chronic process. Electronically Signed   By: Lucienne Capers M.D.   On: 08/03/2021 23:22   DG Chest 1 View  Result Date: 08/03/2021 CLINICAL DATA:  Shortness of breath.  Altered mental status. EXAM: CHEST  1 VIEW COMPARISON:  01/22/2021 FINDINGS: Mild cardiomegaly noted. Aortic atherosclerotic calcification incidentally noted. Low lung volumes are seen. New opacity is seen in the lateral left lung base which may be due to atelectasis or infiltrate. No evidence of pleural effusion. IMPRESSION: Mild atelectasis versus infiltrate in lateral left lung base. Mild cardiomegaly. Electronically Signed   By: Marlaine Hind M.D.   On: 08/03/2021 20:32    Procedures Procedures   Medications Ordered in ED Medications  cefTRIAXone (ROCEPHIN) 1 g in sodium chloride 0.9 % 100 mL IVPB (0 g Intravenous Stopped 08/03/21 2316)  azithromycin (ZITHROMAX) 500 mg in sodium chloride 0.9 % 250 mL IVPB (500 mg Intravenous New Bag/Given 08/03/21 2322)  hydrALAZINE (APRESOLINE) tablet 25 mg (25 mg Oral Given  08/03/21 2230)  carvedilol (COREG) tablet 12.5 mg (12.5 mg Oral Given 08/03/21 2230)  acetaminophen (TYLENOL) tablet 650 mg (650 mg Oral Given 08/03/21 2230)  ondansetron (ZOFRAN-ODT) disintegrating tablet 4 mg (4 mg Oral Given 08/03/21 2230)  lactated ringers bolus 1,000 mL (  1,000 mLs Intravenous New Bag/Given 08/03/21 2321)    ED Course/ Medical Decision Making/ A&P Clinical Course as of 08/04/21 0020  Sat Aug 03, 2021  2100 DG Chest 1 View [WS]    Clinical Course User Index [WS] Cristie Hem, MD                           Medical Decision Making Amount and/or Complexity of Data Reviewed Labs: ordered. Radiology: ordered. Decision-making details documented in ED Course.  Risk OTC drugs. Prescription drug management. Decision regarding hospitalization.   88 year old male presenting to the emergency department with altered mental status.  Patient well-appearing but is confused.  Neurologic exam nonfocal.  Vitals notable for hypertension and fever  Infectious work-up obtained, notable for likely pneumonia on chest x-ray.  Urinalysis not suspicious for infection.  No abdominal tenderness to suggest intra-abdominal infection.  No headache or meningismus to suggest meningitis.  No skin rashes to suggest cellulitis or skin and soft tissue infection.  Gave IV fluids, obtain blood cultures.  Ordered antibiotics.  Discussed STEMI activation with cardiology who agrees that patient is not having a STEMI.  I independently reviewed chest x-ray which was concerning for pneumonia.  Discussed with hospitalist will admit the patient for further management         Final Clinical Impression(s) / ED Diagnoses Final diagnoses:  Community acquired pneumonia, unspecified laterality  Fever, unspecified fever cause    Rx / DC Orders ED Discharge Orders     None         Cristie Hem, MD 08/04/21 0020

## 2021-08-05 ENCOUNTER — Ambulatory Visit: Payer: PPO | Admitting: Student

## 2021-08-05 ENCOUNTER — Ambulatory Visit: Payer: PPO | Admitting: Cardiology

## 2021-08-05 DIAGNOSIS — J9601 Acute respiratory failure with hypoxia: Secondary | ICD-10-CM | POA: Diagnosis not present

## 2021-08-05 DIAGNOSIS — J189 Pneumonia, unspecified organism: Secondary | ICD-10-CM | POA: Diagnosis not present

## 2021-08-05 LAB — CBC
HCT: 32.5 % — ABNORMAL LOW (ref 39.0–52.0)
Hemoglobin: 10.2 g/dL — ABNORMAL LOW (ref 13.0–17.0)
MCH: 26.1 pg (ref 26.0–34.0)
MCHC: 31.4 g/dL (ref 30.0–36.0)
MCV: 83.1 fL (ref 80.0–100.0)
Platelets: 199 10*3/uL (ref 150–400)
RBC: 3.91 MIL/uL — ABNORMAL LOW (ref 4.22–5.81)
RDW: 14.4 % (ref 11.5–15.5)
WBC: 11.6 10*3/uL — ABNORMAL HIGH (ref 4.0–10.5)
nRBC: 0 % (ref 0.0–0.2)

## 2021-08-05 LAB — GLUCOSE, CAPILLARY
Glucose-Capillary: 139 mg/dL — ABNORMAL HIGH (ref 70–99)
Glucose-Capillary: 159 mg/dL — ABNORMAL HIGH (ref 70–99)
Glucose-Capillary: 172 mg/dL — ABNORMAL HIGH (ref 70–99)
Glucose-Capillary: 210 mg/dL — ABNORMAL HIGH (ref 70–99)

## 2021-08-05 LAB — COMPREHENSIVE METABOLIC PANEL
ALT: 15 U/L (ref 0–44)
AST: 16 U/L (ref 15–41)
Albumin: 3 g/dL — ABNORMAL LOW (ref 3.5–5.0)
Alkaline Phosphatase: 84 U/L (ref 38–126)
Anion gap: 7 (ref 5–15)
BUN: 15 mg/dL (ref 8–23)
CO2: 28 mmol/L (ref 22–32)
Calcium: 9.6 mg/dL (ref 8.9–10.3)
Chloride: 102 mmol/L (ref 98–111)
Creatinine, Ser: 1.06 mg/dL (ref 0.61–1.24)
GFR, Estimated: >60 mL/min (ref >60)
Glucose, Bld: 132 mg/dL — ABNORMAL HIGH (ref 70–99)
Potassium: 3.6 mmol/L (ref 3.5–5.1)
Sodium: 137 mmol/L (ref 135–145)
Total Bilirubin: 0.2 mg/dL — ABNORMAL LOW (ref 0.3–1.2)
Total Protein: 5.5 g/dL — ABNORMAL LOW (ref 6.5–8.1)

## 2021-08-05 MED ORDER — CARVEDILOL 25 MG PO TABS
25.0000 mg | ORAL_TABLET | Freq: Two times a day (BID) | ORAL | Status: DC
  Administered 2021-08-05 – 2021-08-06 (×3): 25 mg via ORAL
  Filled 2021-08-05 (×3): qty 1

## 2021-08-05 MED ORDER — POLYETHYLENE GLYCOL 3350 17 G PO PACK
17.0000 g | PACK | Freq: Every day | ORAL | Status: DC | PRN
  Administered 2021-08-05: 17 g via ORAL
  Filled 2021-08-05: qty 1

## 2021-08-05 MED ORDER — HYDRALAZINE HCL 20 MG/ML IJ SOLN
10.0000 mg | INTRAMUSCULAR | Status: DC | PRN
  Administered 2021-08-05 (×2): 10 mg via INTRAVENOUS
  Filled 2021-08-05 (×2): qty 1

## 2021-08-05 MED ORDER — RIVAROXABAN 20 MG PO TABS
20.0000 mg | ORAL_TABLET | Freq: Every day | ORAL | Status: DC
  Administered 2021-08-05 – 2021-08-06 (×2): 20 mg via ORAL
  Filled 2021-08-05 (×2): qty 1

## 2021-08-05 NOTE — Progress Notes (Signed)
Mobility Specialist - Progress Note   08/05/21 1100  Mobility  Activity Ambulated with assistance in hallway  Level of Assistance Minimal assist, patient does 75% or more  Assistive Device Other (Comment) (HHA)  Distance Ambulated (ft) 120 ft  Activity Response Tolerated well  $Mobility charge 1 Mobility   Pre-mobility: 161/101 (117) BP, 96% SpO2 During mobility: 97% SpO2 Post-mobility: 192/100 (128) BP, 97% SPO2  Pt received in recliner agreeable to mobility. Required minA throughout ambulation due to LOBx2. Pt returned to recliner with call bell and all needs met.   Paulla Dolly Mobility Specialist

## 2021-08-05 NOTE — Progress Notes (Signed)
Progress Note   Patient: Brett Matthews BSJ:[id number] DOB: 1932/11/25 DOA: 08/03/2021     2 DOS: the patient was seen and examined on 08/05/2021   Brief hospital course: 88 y.o. male with medical history significant of paroxysmal atrial fibrillation on Xarelto, hypertension, hypothyroidism, type 2 diabetes, BPH who presents due to concerns of rigors.   Patient is a difficult historian as he is hard of hearing.  Really patient was out in the sun today for a funeral and afterwards at home he developed sudden onset rigors.  He reports that he was otherwise in his normal state of health.  He denies any coughing, chest pain or shortness of breath.  No nausea, vomiting or diarrhea.  No fever.   Initially patient presented as a code STEMI was activated by EMS after reviewing monitoring but without obtaining an actual twelve-lead EKG.  This was revealed by cardiology on arrival and was canceled.  EKG shows right bundle branch block and left anterior fascicular block. He was noted to be febrile up to 101.4, tachycardic and tachypneic requiring 4 L via nasal cannula.  Also hypertensive up to 197/97 which improved after 25 mg of oral hydralazine in the ED.   CBC without leukocytosis, hemoglobin of 12.9.  No significant electrolyte abnormalities.  Normal LFTs.  CT head was obtained which was negative.  Chest x-ray showed concerns of mild atelectasis versus infiltrate in the left lateral lung base.  He was then started on IV Rocephin and azithromycin.  Hospitalist then consulted for further management.   Assessment and Plan: * Sepsis secondary to possible PNA and ecoli bacteremia ([place]) -Secondary to community-acquired pneumonia.  Patient presented with fever, tachycardia and tachypnea requiring 4 L via nasal cannula with chest x-ray finding of possible infiltrate to the left lung base. -Had been on IV Rocephin and azithromycin -will f/u on urine cx -Pt did report some dysuria this AM -Narrowed abx to  rocephin   Essential hypertension, benign -Continue home amlodipine, diltiazem and Coreg -BP suboptimally controlled -Increased coreg to '25mg'$ , added hydralazine PRN   Type 2 diabetes mellitus without complication, without long-term current use of insulin (HCC) -Cont SIS coverage as needeed   Paroxysmal atrial fibrillation (HCC) Continue diltiazem and Xarelto   Acute respiratory failure with hypoxia (HCC) Secondary to community-acquired.  Requiring 4 L due to tachypnea. - Now on rocephin - Maintain O2 greater than 92% and wean as tolerated -Wean O2 as tolerated   CAD, NATIVE VESSEL Continue Xarelto   Hypothyroidism Continue levothyroxine      Subjective: Reports feeling better today  Physical Exam: Vitals:   08/04/21 1641 08/04/21 2035 08/05/21 0500 08/05/21 0931  BP: 138/81 (!) 171/92 (!) 156/87 (!) 179/101  Pulse: 71 89 87 86  Resp: '19 19 18 18  '$ Temp: 98.3 F (36.8 C) 98.4 F (36.9 C) 98.4 F (36.9 C) 98.2 F (36.8 C)  TempSrc:  Oral Oral   SpO2: 97% 96% 100% 94%  Weight:      Height:       General exam: Conversant, in no acute distress Respiratory system: normal chest rise, clear, no audible wheezing Cardiovascular system: regular rhythm, s1-s2 Gastrointestinal system: Nondistended, nontender, pos BS Central nervous system: No seizures, no tremors Extremities: No cyanosis, no joint deformities Skin: No rashes, no pallor Psychiatry: Affect normal // no auditory hallucinations   Data Reviewed:  Labs reviewed: Na 137, Cr 1.06  Family Communication: Pt in room, family not at bedside  Disposition: Status is: Inpatient Remains inpatient  appropriate because: Severity of illness  Planned Discharge Destination:  Unclear at this time    Author: Marylu Lund, MD 08/05/2021 1:55 PM  For on call review [email or web address].

## 2021-08-05 NOTE — TOC Initial Note (Signed)
Transition of Care [facility]) - Initial/Assessment Note    Patient Details  Name: Brett Matthews MRN: [id number] Date of Birth: 19-Aug-1932  [facility] CM/SW Contact:    Tom-Johnson, Renea Ee, RN Phone Number: 08/05/2021, 1:08 PM  Clinical Narrative:                  CM spoke with patient at bedside about needs for post hospital transition. Admitted for Sepsis 2/2 Pneumonia. Found to have Ecoli Bacteremia. On IV abx.  From home alone. Patient is very hard of hearing even with his hearing aids. Has two supportive sons. Independent with care and drive self prior to admission. Has a cane. [place]. W/c, shower seat and grab bars at home.  PCP is Glenda Chroman, MD and uses [facility].  Home health PT referral called to [facility] and [facility] voiced acceptance. Patient stated he was active with their services this past May; 05/23. Info on AVS. Family to transport at discharge.  CM will continue to follow with needs as patient progresses with care.    Expected Discharge Plan: [place] Barriers to Discharge: Continued Medical Work up   Patient Goals and CMS Choice Patient states their goals for this hospitalization and ongoing recovery are:: To return home CMS Medicare.gov Compare Post Acute Care list provided to:: Patient Choice offered to / list presented to : Patient  Expected Discharge Plan and Services Expected Discharge Plan: [place]   Discharge Planning Services: CM Consult Post Acute Care Choice: [place] arrangements for the past 2 months: Single Family Home                 DME Arranged: N/A DME Agency: NA       HH Arranged: PT, OT HH Agency: [facility] Date [place]: 08/05/21 Time HH Agency Contacted: 70 Representative spoke with at [place]: [facility]  Prior Living Arrangements/Services Living arrangements for the past 2 months: [place] with:: Self Patient  language and need for interpreter reviewed:: Yes Do you feel safe going back to the place where you live?: Yes      Need for Family Participation in Patient Care: Yes (Comment) Care giver support system in place?: Yes (comment) Current home services: DME (Cane, [place], w/c, shower seat and grab bars.) Criminal Activity/Legal Involvement Pertinent to Current Situation/Hospitalization: No - Comment as needed  Activities of Daily Living Home Assistive Devices/Equipment: Cane (specify quad or straight) ADL Screening (condition at time of admission) Patient's cognitive ability adequate to safely complete daily activities?: Yes Is the patient deaf or have difficulty hearing?: Yes Does the patient have difficulty seeing, even when wearing glasses/contacts?: No Patient able to express need for assistance with ADLs?: Yes Does the patient have difficulty dressing or bathing?: No Independently performs ADLs?: Yes (appropriate for developmental age) Does the patient have difficulty walking or climbing stairs?: Yes Weakness of Legs: None Weakness of Arms/Hands: None  Permission Sought/Granted Permission sought to share information with : Case Manager, Customer service manager, Family Supports Permission granted to share information with : Yes, Verbal Permission Granted              Emotional Assessment Appearance:: Appears stated age Attitude/Demeanor/Rapport: Engaged, Gracious Affect (typically observed): Accepting, Appropriate, Calm, Hopeful, Pleasant Orientation: : Oriented to Self, Oriented to Place, Oriented to  Time, Oriented to Situation Alcohol / Substance Use: Not Applicable Psych Involvement: No (comment)  Admission diagnosis:  Sepsis ([place]) [A41.9] Fever, unspecified fever cause [R50.9] Community acquired pneumonia, unspecified laterality [J18.9] Patient Active Problem List   Diagnosis Date Noted   Community acquired pneumonia 08/04/2021   Sepsis ([place]) 08/03/2021    Delirium 03/20/2021   Hard of hearing 03/20/2021   BPH (benign prostatic hyperplasia) 03/20/2021   Mood disorder ([place]) 03/20/2021   Closed right ankle fracture 03/19/2021   Persistent atrial fibrillation ([place])    Acute respiratory failure with hypoxia ([place]) 08/19/2019   Pneumonia due to COVID-19 virus 08/18/2019   Chest pain of uncertain etiology 54/00/8676   H/O hemorrhoidectomy    Bleeding hemorrhoids 03/24/2017   Abdominal pain 03/02/2017   Normocytic anemia 03/02/2017   Rectal bleeding 03/17/2013   Hypokalemia 06/27/2011   Dyslipidemia 07/24/2010   CAD, NATIVE VESSEL 09/26/2008   Hypothyroidism 08/06/2007   Type 2 diabetes mellitus without complication, without long-term current use of insulin ([place]) 08/06/2007   Essential hypertension, benign 08/06/2007   GASTROESOPHAGEAL REFLUX DISEASE, CHRONIC 08/06/2007   Paroxysmal atrial fibrillation ([place]) 08/06/2007   PCP:  Glenda Chroman, MD Pharmacy:   [facility], [place] [address] [place] [address] Phone: [phone] Fax: [phone]     Social Determinants of Health (SDOH) Interventions    Readmission Risk Interventions     No data to display

## 2021-08-05 NOTE — Evaluation (Signed)
Physical Therapy Evaluation Patient Details Name: Brett Matthews MRN: [id number] DOB: [birth year] Today's Date: 08/05/2021  History of Present Illness  Pt is an 88 y/o male admitted secondary to confusion. Found to have sepsis secondary to CAP. PMH includes DM, HTN, CAD, and a fib.  Clinical Impression  Pt admitted secondary to problem above with deficits below. Pt requiring min A for mobility tasks this session secondary to mild unsteadiness. Also noted cognitive deficits, however, unsure of baseline as no family present. Reports he has a caregiver than comes "almost every day". Recommending HHPT at d/c to address mobility deficits. Will continue to follow acutely.        Recommendations for follow up therapy are one component of a multi-disciplinary discharge planning process, led by the attending physician.  Recommendations may be updated based on patient status, additional functional criteria and insurance authorization.  Follow Up Recommendations Home health PT      Assistance Recommended at Discharge Intermittent Supervision/Assistance  Patient can return home with the following  A little help with bathing/dressing/bathroom;Assist for transportation;Assistance with cooking/housework;Direct supervision/assist for medications management    Equipment Recommendations None recommended by PT  Recommendations for Other Services       Functional Status Assessment Patient has had a recent decline in their functional status and demonstrates the ability to make significant improvements in function in a reasonable and predictable amount of time.     Precautions / Restrictions Precautions Precautions: Fall Restrictions Weight Bearing Restrictions: No      Mobility  Bed Mobility               General bed mobility comments: In chair upon entry    Transfers Overall transfer level: Needs assistance Equipment used: None Transfers: Sit to/from Stand Sit to Stand: Min assist            General transfer comment: Min A for lift assist and steadying.    Ambulation/Gait Ambulation/Gait assistance: Min assist Gait Distance (Feet): 150 Feet Assistive device: None Gait Pattern/deviations: Step-through pattern, Decreased stride length Gait velocity: Decreased     General Gait Details: Increased unsteadiness noted with fatigue. Required min A for steadying throughout. Educated about using RW at home to increase safety.  Stairs            Wheelchair Mobility    Modified Rankin (Stroke Patients Only)       Balance Overall balance assessment: Needs assistance Sitting-balance support: No upper extremity supported, Feet supported Sitting balance-Leahy Scale: Good     Standing balance support: No upper extremity supported Standing balance-Leahy Scale: Fair                               Pertinent Vitals/Pain Pain Assessment Pain Assessment: No/denies pain    Home Living Family/patient expects to be discharged to:: Private residence Living Arrangements: Non-relatives/Friends Available Help at Discharge: Friend(s);Available PRN/intermittently Type of Home: House Home Access: Ramped entrance       Home Layout: Two level;Able to live on main level with bedroom/bathroom;Full bath on main level Home Equipment: International aid/development worker (2 wheels);Rollator (4 wheels) (lift chair)      Prior Function Prior Level of Function : Needs assist             Mobility Comments: Normally does not use AD ADLs Comments: Supervision for shower transfers and ADL tasks.     Hand Dominance        Extremity/Trunk Assessment  Upper Extremity Assessment Upper Extremity Assessment: Defer to OT evaluation    Lower Extremity Assessment Lower Extremity Assessment: Generalized weakness    Cervical / Trunk Assessment Cervical / Trunk Assessment: Normal  Communication   Communication: HOH  Cognition Arousal/Alertness:  Awake/alert Behavior During Therapy: WFL for tasks assessed/performed Overall Cognitive Status: No family/caregiver present to determine baseline cognitive functioning                                 General Comments: Easily distracted and difficulty dual tasking .        General Comments      Exercises     Assessment/Plan    PT Assessment Patient needs continued PT services  PT Problem List Decreased strength;Decreased activity tolerance;Decreased balance;Decreased mobility;Decreased knowledge of use of DME;Decreased knowledge of precautions;Decreased cognition;Decreased safety awareness       PT Treatment Interventions DME instruction;Gait training;Functional mobility training;Therapeutic activities;Therapeutic exercise;Balance training;Patient/family education;Cognitive remediation    PT Goals (Current goals can be found in the Care Plan section)  Acute Rehab PT Goals Patient Stated Goal: to go home PT Goal Formulation: With patient Time For Goal Achievement: 08/19/21 Potential to Achieve Goals: Good    Frequency Min 3X/week     Co-evaluation               AM-PAC PT "6 Clicks" Mobility  Outcome Measure Help needed turning from your back to your side while in a flat bed without using bedrails?: None Help needed moving from lying on your back to sitting on the side of a flat bed without using bedrails?: A Little Help needed moving to and from a bed to a chair (including a wheelchair)?: A Little Help needed standing up from a chair using your arms (e.g., wheelchair or bedside chair)?: A Little Help needed to walk in hospital room?: A Little Help needed climbing 3-5 steps with a railing? : A Lot 6 Click Score: 18    End of Session Equipment Utilized During Treatment: Gait belt Activity Tolerance: Patient tolerated treatment well Patient left: in chair;with call bell/phone within reach;with chair alarm set Nurse Communication: Mobility status PT  Visit Diagnosis: Unsteadiness on feet (R26.81);Muscle weakness (generalized) (M62.81);History of falling (Z91.81)    Time: 2951-8841 PT Time Calculation (min) (ACUTE ONLY): 15 min   Charges:   PT Evaluation $PT Eval Low Complexity: 1 Low          [name], PT, DPT  Acute Rehabilitation Services  Office: [phone]   [name] 08/05/2021, 12:31 PM

## 2021-08-06 ENCOUNTER — Other Ambulatory Visit (HOSPITAL_COMMUNITY): Payer: Self-pay

## 2021-08-06 DIAGNOSIS — J189 Pneumonia, unspecified organism: Secondary | ICD-10-CM | POA: Diagnosis not present

## 2021-08-06 DIAGNOSIS — J9601 Acute respiratory failure with hypoxia: Secondary | ICD-10-CM | POA: Diagnosis not present

## 2021-08-06 LAB — CBC
HCT: 32.8 % — ABNORMAL LOW (ref 39.0–52.0)
Hemoglobin: 10.5 g/dL — ABNORMAL LOW (ref 13.0–17.0)
MCH: 26.5 pg (ref 26.0–34.0)
MCHC: 32 g/dL (ref 30.0–36.0)
MCV: 82.8 fL (ref 80.0–100.0)
Platelets: 202 10*3/uL (ref 150–400)
RBC: 3.96 MIL/uL — ABNORMAL LOW (ref 4.22–5.81)
RDW: 14.4 % (ref 11.5–15.5)
WBC: 8.1 10*3/uL (ref 4.0–10.5)
nRBC: 0 % (ref 0.0–0.2)

## 2021-08-06 LAB — COMPREHENSIVE METABOLIC PANEL
ALT: 14 U/L (ref 0–44)
AST: 14 U/L — ABNORMAL LOW (ref 15–41)
Albumin: 2.9 g/dL — ABNORMAL LOW (ref 3.5–5.0)
Alkaline Phosphatase: 91 U/L (ref 38–126)
Anion gap: 6 (ref 5–15)
BUN: 8 mg/dL (ref 8–23)
CO2: 27 mmol/L (ref 22–32)
Calcium: 9.5 mg/dL (ref 8.9–10.3)
Chloride: 105 mmol/L (ref 98–111)
Creatinine, Ser: 0.9 mg/dL (ref 0.61–1.24)
GFR, Estimated: >60 mL/min (ref >60)
Glucose, Bld: 147 mg/dL — ABNORMAL HIGH (ref 70–99)
Potassium: 3.2 mmol/L — ABNORMAL LOW (ref 3.5–5.1)
Sodium: 138 mmol/L (ref 135–145)
Total Bilirubin: 0.3 mg/dL (ref 0.3–1.2)
Total Protein: 5.6 g/dL — ABNORMAL LOW (ref 6.5–8.1)

## 2021-08-06 LAB — CULTURE, BLOOD (ROUTINE X 2)

## 2021-08-06 LAB — GLUCOSE, CAPILLARY
Glucose-Capillary: 131 mg/dL — ABNORMAL HIGH (ref 70–99)
Glucose-Capillary: 198 mg/dL — ABNORMAL HIGH (ref 70–99)
Glucose-Capillary: 220 mg/dL — ABNORMAL HIGH (ref 70–99)

## 2021-08-06 LAB — URINE CULTURE: Culture: NO GROWTH

## 2021-08-06 MED ORDER — BISACODYL 10 MG RE SUPP
10.0000 mg | Freq: Once | RECTAL | Status: AC
  Administered 2021-08-06: 10 mg via RECTAL
  Filled 2021-08-06: qty 1

## 2021-08-06 MED ORDER — POLYETHYLENE GLYCOL 3350 17 G PO PACK
17.0000 g | PACK | Freq: Two times a day (BID) | ORAL | Status: DC
Start: 2021-08-06 — End: 2021-08-06
  Administered 2021-08-06: 17 g via ORAL
  Filled 2021-08-06: qty 1

## 2021-08-06 MED ORDER — AMLODIPINE BESYLATE 10 MG PO TABS
10.0000 mg | ORAL_TABLET | Freq: Every day | ORAL | 0 refills | Status: DC
Start: 2021-08-07 — End: 2022-03-04
  Filled 2021-08-06: qty 30, 30d supply, fill #0

## 2021-08-06 MED ORDER — CARVEDILOL 25 MG PO TABS
25.0000 mg | ORAL_TABLET | Freq: Two times a day (BID) | ORAL | 0 refills | Status: DC
  Filled 2021-08-06: qty 60, 30d supply, fill #0

## 2021-08-06 MED ORDER — CEPHALEXIN 500 MG PO CAPS
500.0000 mg | ORAL_CAPSULE | Freq: Two times a day (BID) | ORAL | 0 refills | Status: AC
  Filled 2021-08-06: qty 10, 5d supply, fill #0

## 2021-08-06 MED ORDER — RIVAROXABAN 20 MG PO TABS
20.0000 mg | ORAL_TABLET | Freq: Every day | ORAL | 0 refills | Status: DC
  Filled 2021-08-06: qty 30, 30d supply, fill #0

## 2021-08-06 MED ORDER — CEFAZOLIN SODIUM-DEXTROSE 2-4 GM/100ML-% IV SOLN
2.0000 g | Freq: Three times a day (TID) | INTRAVENOUS | Status: DC
  Administered 2021-08-06: 2 g via INTRAVENOUS
  Filled 2021-08-06: qty 100

## 2021-08-06 NOTE — Discharge Summary (Signed)
Physician Discharge Summary   Patient: Brett Matthews MRN: [id number] DOB: [birth year]  Admit date:     08/03/2021  Discharge date: 08/06/21  Discharge Physician: Marylu Lund   PCP: Glenda Chroman, MD   Recommendations at discharge:    Follow up with PCP in 1-2 weeks Recommend further titrating bp meds with goal of normotension  Discharge Diagnoses: Principal Problem:   Sepsis ([name]) Active Problems:   Type 2 diabetes mellitus without complication, without long-term current use of insulin (HCC)   Essential hypertension, benign   Paroxysmal atrial fibrillation (HCC)   Hypothyroidism   CAD, NATIVE VESSEL   Acute respiratory failure with hypoxia ([place])   Community acquired pneumonia  Resolved Problems:   * No resolved hospital problems. *  Hospital Course: 88 y.o. male with medical history significant of paroxysmal atrial fibrillation on Xarelto, hypertension, hypothyroidism, type 2 diabetes, BPH who presents due to concerns of rigors.   Patient is a difficult historian as he is hard of hearing.  Really patient was out in the sun today for a funeral and afterwards at home he developed sudden onset rigors.  He reports that he was otherwise in his normal state of health.  He denies any coughing, chest pain or shortness of breath.  No nausea, vomiting or diarrhea.  No fever.   Initially patient presented as a code STEMI was activated by EMS after reviewing monitoring but without obtaining an actual twelve-lead EKG.  This was revealed by cardiology on arrival and was canceled.  EKG shows right bundle branch block and left anterior fascicular block. He was noted to be febrile up to 101.4, tachycardic and tachypneic requiring 4 L via nasal cannula.  Also hypertensive up to 197/97 which improved after 25 mg of oral hydralazine in the ED.   CBC without leukocytosis, hemoglobin of 12.9.  No significant electrolyte abnormalities.  Normal LFTs.  CT head was obtained which was negative.  Chest  x-ray showed concerns of mild atelectasis versus infiltrate in the left lateral lung base.  He was then started on IV Rocephin and azithromycin.  Hospitalist then consulted for further management.   Assessment and Plan: * Sepsis secondary to possible PNA and ecoli bacteremia ([place]) -Secondary to community-acquired pneumonia.  Patient presented with fever, tachycardia and tachypnea requiring 4 L via nasal cannula with chest x-ray finding of possible infiltrate to the left lung base. -Had been on IV Rocephin and azithromycin, later narrowed to ancef -Pt did report some dysuria this visit -Weaned to room air  -Pt to complete course of keflex on d/c   Essential hypertension, benign -Continued home amlodipine, diltiazem -BP suboptimally controlled but improved since admit -Increased coreg to '25mg'$    Type 2 diabetes mellitus without complication, without long-term current use of insulin ([name]) -Given SSI coverage as needed while in hospital   Paroxysmal atrial fibrillation (HCC) Continue diltiazem and Xarelto   Acute respiratory failure with hypoxia ([name]) Secondary to community-acquired.  Required 4 L due to tachypnea. - Weaned to room air   CAD, NATIVE VESSEL Continue Xarelto   Hypothyroidism Continue levothyroxine      Consultants:  Procedures performed:   Disposition: Home Diet recommendation:  Regular diet  DISCHARGE MEDICATION: Allergies as of 08/06/2021   No Known Allergies      Medication List     STOP taking these medications    olmesartan 40 MG tablet Commonly known as: BENICAR       TAKE these medications    acetaminophen 500 MG  tablet Commonly known as: TYLENOL Take 2 tablets (1,000 mg total) by mouth every 6 (six) hours as needed for mild pain or moderate pain.   albuterol 108 (90 Base) MCG/ACT inhaler Commonly known as: VENTOLIN HFA Inhale 1-2 puffs into the lungs every 6 (six) hours as needed for wheezing or shortness of breath.   amLODipine 10  MG tablet Commonly known as: NORVASC Take 1 tablet (10 mg total) by mouth daily. Start taking on: August 07, 2021   atorvastatin 10 MG tablet Commonly known as: LIPITOR Take 1 tablet (10 mg total) by mouth daily.   carvedilol 25 MG tablet Commonly known as: COREG Take 1 tablet (25 mg total) by mouth 2 (two) times daily with a meal. What changed:  medication strength how much to take   cephALEXin 500 MG capsule Commonly known as: KEFLEX Take 1 capsule (500 mg total) by mouth 2 (two) times daily for 5 days.   citalopram 20 MG tablet Commonly known as: CELEXA Take 1 tablet (20 mg total) by mouth at bedtime. What changed: how much to take   cyanocobalamin 1000 MCG tablet Commonly known as: VITAMIN B12 Take 1,000 mcg by mouth daily.   D3-1000 25 MCG (1000 UT) capsule Generic drug: Cholecalciferol Take 1,000 Units by mouth every evening.   diltiazem 240 MG 24 hr capsule Commonly known as: CARDIZEM CD Take 240 mg by mouth daily.   docusate sodium 100 MG capsule Commonly known as: COLACE Take 100 mg by mouth every evening.   famotidine 40 MG tablet Commonly known as: PEPCID Take 40 mg by mouth daily.   fluticasone 50 MCG/ACT nasal spray Commonly known as: FLONASE Place 1 spray into both nostrils daily as needed for allergies.   folic acid 1 MG tablet Commonly known as: FOLVITE Take 1 mg by mouth daily.   gabapentin 300 MG capsule Commonly known as: NEURONTIN Take 300 mg by mouth 3 (three) times daily.   glimepiride 2 MG tablet Commonly known as: AMARYL Take 2 mg by mouth 2 (two) times daily.   hydrALAZINE 25 MG tablet Commonly known as: APRESOLINE Take 1 tablet (25 mg total) by mouth 3 (three) times daily as needed (As needed for BP >140/90).   levothyroxine 125 MCG tablet Commonly known as: SYNTHROID Take 125 mcg by mouth daily at 2 am.   Melatonin 10 MG Tabs Take 10 mg by mouth at bedtime.   metFORMIN 500 MG tablet Commonly known as: GLUCOPHAGE Take  500-1,000 mg by mouth See admin instructions. Take 2 tabs (1,'000mg'$ ) by mouth every morning & 1 tab ('500mg'$ ) every evening   pantoprazole 40 MG tablet Commonly known as: PROTONIX Take 40 mg by mouth daily at 12 noon.   polyethylene glycol 17 g packet Commonly known as: MIRALAX / GLYCOLAX Take 17 g by mouth daily.   rivaroxaban 20 MG Tabs tablet Commonly known as: XARELTO Take 1 tablet (20 mg total) by mouth daily with supper. What changed:  medication strength how much to take when to take this   senna-docusate 8.6-50 MG tablet Commonly known as: Senokot-S Take 1 tablet by mouth 2 (two) times daily as needed for moderate constipation.   tamsulosin 0.4 MG Caps capsule Commonly known as: FLOMAX Take 0.4 mg by mouth daily after breakfast.   Tyler Aas 100 UNIT/ML Soln Generic drug: Insulin Degludec Inject 18 Units into the skin daily.   zinc gluconate 50 MG tablet Take 50 mg by mouth daily.        Follow-up Information  [facility] Follow up.   Specialty: Home Health Services Why: Someone will call you to schedule first home visit. Contact information: [address] [place] [address] [phone]         Glenda Chroman, MD Follow up in 1 week(s).   Specialty: Internal Medicine Why: Hospital follow up Contact information: [address] [place] [address] [place]         Satira Sark, MD .   Specialty: Cardiology Contact information: [place] [address] [phone]                Discharge Exam: Danley Danker Weights   08/04/21 0449  Weight: 89.5 kg   General exam: Awake, laying in bed, in nad Respiratory system: Normal respiratory effort, no wheezing Cardiovascular system: regular rate, s1, s2 Gastrointestinal system: Soft, nondistended, positive BS Central nervous system: CN2-12 grossly intact, strength intact Extremities: Perfused, no clubbing Skin: Normal skin turgor, no notable skin  lesions seen Psychiatry: Mood normal // no visual hallucinations   Condition at discharge: fair  The results of significant diagnostics from this hospitalization (including imaging, microbiology, ancillary and laboratory) are listed below for reference.   Imaging Studies: CT HEAD WO CONTRAST  Result Date: 08/03/2021 CLINICAL DATA:  Mental status change of unknown cause. Hypertension and shortness of breath. EXAM: CT HEAD WITHOUT CONTRAST TECHNIQUE: Contiguous axial images were obtained from the base of the skull through the vertex without intravenous contrast. RADIATION DOSE REDUCTION: This exam was performed according to the departmental dose-optimization program which includes automated exposure control, adjustment of the mA and/or kV according to patient size and/or use of iterative reconstruction technique. COMPARISON:  03/13/2021 FINDINGS: Brain: Diffuse cerebral atrophy. Ventricular dilatation consistent with central atrophy. Low-attenuation changes in the deep white matter consistent with small vessel ischemia. Old lacunar infarct in the left internal capsule. Prominent CSF space in the right anterior frontal region may represent asymmetrical atrophy or arachnoid cyst. No change since prior study. No mass effect or midline shift. Gray-white matter junctions are distinct. Basal cisterns are not effaced. No acute intracranial hemorrhage. Vascular: Intracranial vascular calcifications. Skull: Calvarium appears intact. Sinuses/Orbits: Paranasal sinuses and mastoid air cells are clear. Other: No significant change since previous study. IMPRESSION: No acute intracranial abnormalities. Chronic atrophy and small vessel ischemic changes. Prominent CSF space in the right frontal region is unchanged since prior study suggesting chronic process. Electronically Signed   By: Lucienne Capers M.D.   On: 08/03/2021 23:22   DG Chest 1 View  Result Date: 08/03/2021 CLINICAL DATA:  Shortness of breath.  Altered  mental status. EXAM: CHEST  1 VIEW COMPARISON:  01/22/2021 FINDINGS: Mild cardiomegaly noted. Aortic atherosclerotic calcification incidentally noted. Low lung volumes are seen. New opacity is seen in the lateral left lung base which may be due to atelectasis or infiltrate. No evidence of pleural effusion. IMPRESSION: Mild atelectasis versus infiltrate in lateral left lung base. Mild cardiomegaly. Electronically Signed   By: Marlaine Hind M.D.   On: 08/03/2021 20:32    Microbiology: Results for orders placed or performed during the hospital encounter of 08/03/21  Blood culture (routine x 2)     Status: Abnormal   Collection Time: 08/03/21 10:00 PM   Specimen: BLOOD  Result Value Ref Range Status   Specimen Description BLOOD SITE NOT SPECIFIED  Final   Special Requests   Final    BOTTLES DRAWN AEROBIC AND ANAEROBIC Blood Culture results may not  be optimal due to an inadequate volume of blood received in culture bottles   Culture  Setup Time   Final    GRAM NEGATIVE RODS AEROBIC BOTTLE ONLY CRITICAL VALUE NOTED.  VALUE IS CONSISTENT WITH PREVIOUSLY REPORTED AND CALLED VALUE.    Culture (A)  Final    ESCHERICHIA COLI SUSCEPTIBILITIES PERFORMED ON PREVIOUS CULTURE WITHIN THE LAST 5 DAYS. Performed at [facility], [place] [address] [place],  [address]    Report Status 08/06/2021 FINAL  Final  Blood culture (routine x 2)     Status: Abnormal   Collection Time: 08/03/21 10:07 PM   Specimen: BLOOD RIGHT HAND  Result Value Ref Range Status   Specimen Description BLOOD RIGHT HAND  Final   Special Requests   Final    BOTTLES DRAWN AEROBIC AND ANAEROBIC Blood Culture results may not be optimal due to an inadequate volume of blood received in culture bottles   Culture  Setup Time   Final    GRAM NEGATIVE RODS AEROBIC BOTTLE ONLY Organism ID to follow CRITICAL RESULT CALLED TO, READ BACK BY AND VERIFIED WITH: J. MILLEN PHARMD, AT 1516 08/04/21 D. VANHOOK Performed at [facility] [place], [place] [address], [place], [place] [address]    Culture ESCHERICHIA COLI (A)  Final   Report Status 08/06/2021 FINAL  Final   Organism ID, Bacteria ESCHERICHIA COLI  Final      Susceptibility   Escherichia coli - MIC*    AMPICILLIN 8 SENSITIVE Sensitive     CEFAZOLIN <=4 SENSITIVE Sensitive     CEFEPIME <=0.12 SENSITIVE Sensitive     CEFTAZIDIME <=1 SENSITIVE Sensitive     CEFTRIAXONE <=0.25 SENSITIVE Sensitive     CIPROFLOXACIN <=0.25 SENSITIVE Sensitive     GENTAMICIN <=1 SENSITIVE Sensitive     IMIPENEM <=0.25 SENSITIVE Sensitive     TRIMETH/SULFA >=320 RESISTANT Resistant     AMPICILLIN/SULBACTAM 4 SENSITIVE Sensitive     PIP/TAZO <=4 SENSITIVE Sensitive     * ESCHERICHIA COLI  Blood Culture ID Panel (Reflexed)     Status: Abnormal   Collection Time: 08/03/21 10:07 PM  Result Value Ref Range Status   Enterococcus faecalis NOT DETECTED NOT DETECTED Final   Enterococcus Faecium NOT DETECTED NOT DETECTED Final   Listeria monocytogenes NOT DETECTED NOT DETECTED Final   Staphylococcus species NOT DETECTED NOT DETECTED Final   Staphylococcus aureus (BCID) NOT DETECTED NOT DETECTED Final   Staphylococcus epidermidis NOT DETECTED NOT DETECTED Final   Staphylococcus lugdunensis NOT DETECTED NOT DETECTED Final   Streptococcus species NOT DETECTED NOT DETECTED Final   Streptococcus agalactiae NOT DETECTED NOT DETECTED Final   Streptococcus pneumoniae NOT DETECTED NOT DETECTED Final   Streptococcus pyogenes NOT DETECTED NOT DETECTED Final   A.calcoaceticus-baumannii NOT DETECTED NOT DETECTED Final   Bacteroides fragilis NOT DETECTED NOT DETECTED Final   Enterobacterales DETECTED (A) NOT DETECTED Final    Comment: Enterobacterales represent a large order of gram negative bacteria, not a single organism. CRITICAL RESULT CALLED TO, READ BACK BY AND VERIFIED WITH: J. MILLEN PHARMD, AT 1520 08/04/21 D. VANHOOK    Enterobacter cloacae complex NOT DETECTED NOT DETECTED Final   Escherichia coli  DETECTED (A) NOT DETECTED Final    Comment: CRITICAL RESULT CALLED TO, READ BACK BY AND VERIFIED WITH: J. MILLEN PHARMD, AT 1520 08/04/21 D. VANHOOK    Klebsiella aerogenes NOT DETECTED NOT DETECTED Final   Klebsiella oxytoca NOT DETECTED NOT DETECTED Final   Klebsiella pneumoniae NOT DETECTED  NOT DETECTED Final   Proteus species NOT DETECTED NOT DETECTED Final   Salmonella species NOT DETECTED NOT DETECTED Final   Serratia marcescens NOT DETECTED NOT DETECTED Final   Haemophilus influenzae NOT DETECTED NOT DETECTED Final   Neisseria meningitidis NOT DETECTED NOT DETECTED Final   Pseudomonas aeruginosa NOT DETECTED NOT DETECTED Final   Stenotrophomonas maltophilia NOT DETECTED NOT DETECTED Final   Candida albicans NOT DETECTED NOT DETECTED Final   Candida auris NOT DETECTED NOT DETECTED Final   Candida glabrata NOT DETECTED NOT DETECTED Final   Candida krusei NOT DETECTED NOT DETECTED Final   Candida parapsilosis NOT DETECTED NOT DETECTED Final   Candida tropicalis NOT DETECTED NOT DETECTED Final   Cryptococcus neoformans/gattii NOT DETECTED NOT DETECTED Final   CTX-M ESBL NOT DETECTED NOT DETECTED Final   Carbapenem resistance IMP NOT DETECTED NOT DETECTED Final   Carbapenem resistance KPC NOT DETECTED NOT DETECTED Final   Carbapenem resistance NDM NOT DETECTED NOT DETECTED Final   Carbapenem resist OXA 48 LIKE NOT DETECTED NOT DETECTED Final   Carbapenem resistance VIM NOT DETECTED NOT DETECTED Final    Comment: Performed at [facility], [address], [place], [place] [address]  Resp Panel by RT-PCR (Flu A&B, Covid) Anterior Nasal Swab     Status: None   Collection Time: 08/03/21 10:13 PM   Specimen: Anterior Nasal Swab  Result Value Ref Range Status   SARS Coronavirus 2 by RT PCR NEGATIVE NEGATIVE Final    Comment: (NOTE) SARS-CoV-2 target nucleic acids are NOT DETECTED.  The SARS-CoV-2 RNA is generally detectable in upper respiratory specimens during the acute phase  of infection. The lowest concentration of SARS-CoV-2 viral copies this assay can detect is 138 copies/mL. A negative result does not preclude SARS-Cov-2 infection and should not be used as the sole basis for treatment or other patient management decisions. A negative result may occur with  improper specimen collection/handling, submission of specimen other than nasopharyngeal swab, presence of viral mutation(s) within the areas targeted by this assay, and inadequate number of viral copies(<138 copies/mL). A negative result must be combined with clinical observations, patient history, and epidemiological information. The expected result is Negative.  Fact Sheet for Patients:  EntrepreneurPulse.com.au  Fact Sheet for Healthcare Providers:  IncredibleEmployment.be  This test is no t yet approved or cleared by the [place] FDA and  has been authorized for detection and/or diagnosis of SARS-CoV-2 by FDA under an Emergency Use Authorization (EUA). This EUA will remain  in effect (meaning this test can be used) for the duration of the COVID-19 declaration under Section 564(b)(1) of the Act, 21 U.S.C.section 360bbb-3(b)(1), unless the authorization is terminated  or revoked sooner.       Influenza A by PCR NEGATIVE NEGATIVE Final   Influenza B by PCR NEGATIVE NEGATIVE Final    Comment: (NOTE) The Xpert Xpress SARS-CoV-2/FLU/RSV plus assay is intended as an aid in the diagnosis of influenza from Nasopharyngeal swab specimens and should not be used as a sole basis for treatment. Nasal washings and aspirates are unacceptable for Xpert Xpress SARS-CoV-2/FLU/RSV testing.  Fact Sheet for Patients: EntrepreneurPulse.com.au  Fact Sheet for Healthcare Providers: IncredibleEmployment.be  This test is not yet approved or cleared by the [place] FDA and has been authorized for detection and/or diagnosis of  SARS-CoV-2 by FDA under an Emergency Use Authorization (EUA). This EUA will remain in effect (meaning this test can be used) for the duration of the COVID-19 declaration under Section 564(b)(1)  of the Act, 21 U.S.C. section 360bbb-3(b)(1), unless the authorization is terminated or revoked.  Performed at [facility], [place] [address] [place], [place] [address]   Urine Culture     Status: None   Collection Time: 08/04/21 10:00 PM   Specimen: Urine, Clean Catch  Result Value Ref Range Status   Specimen Description URINE, CLEAN CATCH  Final   Special Requests NONE  Final   Culture   Final    NO GROWTH Performed at [facility], [place] [address] [place], [place] [address]    Report Status 08/06/2021 FINAL  Final    Labs: CBC: Recent Labs  Lab 08/03/21 2006 08/03/21 2018 08/04/21 0655 08/05/21 0257 08/06/21 0730  WBC 6.1  --  15.9* 11.6* 8.1  NEUTROABS 5.7  --   --   --   --   HGB 11.7* 12.9* 10.1* 10.2* 10.5*  HCT 37.3* 38.0* 31.9* 32.5* 32.8*  MCV 85.2  --  83.3 83.1 82.8  PLT 246  --  221 199 166   Basic Metabolic Panel: Recent Labs  Lab 08/03/21 2006 08/03/21 2018 08/05/21 0257 08/06/21 0730  NA 138 139 137 138  K 3.6 3.6 3.6 3.2*  CL 103  --  102 105  CO2 26  --  28 27  GLUCOSE 162*  --  132* 147*  BUN 14  --  15 8  CREATININE 1.04  --  1.06 0.90  CALCIUM 9.6  --  9.6 9.5   Liver Function Tests: Recent Labs  Lab 08/03/21 2006 08/05/21 0257 08/06/21 0730  AST 17 16 14*  ALT '15 15 14  '$ ALKPHOS 117 84 91  BILITOT 0.6 0.2* 0.3  PROT 6.3* 5.5* 5.6*  ALBUMIN 3.6 3.0* 2.9*   CBG: Recent Labs  Lab 08/05/21 1131 08/05/21 1631 08/05/21 2038 08/06/21 0730 08/06/21 1120  GLUCAP 159* 172* 210* 131* 198*    Discharge time spent: less than 30 minutes.  Signed: Marylu Lund, MD [facility] 08/06/2021

## 2021-08-06 NOTE — TOC Transition Note (Signed)
Transition of Care [facility]) - CM/SW Discharge Note   Patient Details  Name: Brett Matthews MRN: [id number] Date of Birth: 1932-12-03  [facility] CM/SW Contact:  Tom-Johnson, Renea Ee, RN Phone Number: 08/06/2021, 3:24 PM   Clinical Narrative:     Patient is scheduled for discharge today. Home health info on AVS. Friend, Pamala Hurry to transport when she gets off work.  No further TOC needs noted.   Final next level of care: [place] Barriers to Discharge: Barriers Resolved   Patient Goals and CMS Choice Patient states their goals for this hospitalization and ongoing recovery are:: To return home CMS Medicare.gov Compare Post Acute Care list provided to:: Patient Choice offered to / list presented to : Patient  Discharge Placement                Patient to be transferred to facility by: Madelyn Flavors      Discharge Plan and Services   Discharge Planning Services: CM Consult Post Acute Care Choice: Home Health          DME Arranged: N/A DME Agency: NA       HH Arranged: PT, OT HH Agency: [place] Date [place]: 08/05/21 Time [place]: 27 Representative spoke with at [place]: [place] Determinants of Health ([place]) Interventions     Readmission Risk Interventions     No data to display

## 2021-08-06 NOTE — Evaluation (Signed)
Occupational Therapy Evaluation Patient Details Name: Brett Matthews MRN: [id number] DOB: 1932/11/12 Today's Date: 08/06/2021   History of Present Illness Pt is an 88 y/o male admitted secondary to confusion. Found to have sepsis secondary to CAP. PMH includes DM, HTN, CAD, and a fib.   Clinical Impression   Pt currently min guard for transfers and simulated selfcare tasks sit to stand without an assistive device.  Pt lives by himself, so feel he will benefit from acute care OT to help progress ADLs to modified independent level for home.  Oxygen sats at 96% on room air with HR maintained in the mid 70s.       Recommendations for follow up therapy are one component of a multi-disciplinary discharge planning process, led by the attending physician.  Recommendations may be updated based on patient status, additional functional criteria and insurance authorization.   Follow Up Recommendations  Home health OT    Assistance Recommended at Discharge PRN  Patient can return home with the following Assistance with cooking/housework;Assist for transportation;Help with stairs or ramp for entrance    Functional Status Assessment  Patient has had a recent decline in their functional status and demonstrates the ability to make significant improvements in function in a reasonable and predictable amount of time.  Equipment Recommendations  None recommended by OT       Precautions / Restrictions Precautions Precautions: Fall Restrictions Weight Bearing Restrictions: No      Mobility Bed Mobility               General bed mobility comments: Pt sitting EOB to start session, will need to be looked at further.    Transfers Overall transfer level: Needs assistance   Transfers: Sit to/from Stand, Bed to chair/wheelchair/BSC Sit to Stand: Supervision     Step pivot transfers: Supervision            Balance Overall balance assessment: Needs assistance Sitting-balance support: No  upper extremity supported Sitting balance-Leahy Scale: Good     Standing balance support: No upper extremity supported Standing balance-Leahy Scale: Fair                             ADL either performed or assessed with clinical judgement   ADL Overall ADL's : Needs assistance/impaired Eating/Feeding: Independent   Grooming: Wash/dry hands;Wash/dry face;Supervision/safety;Standing   Upper Body Bathing: Set up;Sitting Upper Body Bathing Details (indicate cue type and reason): simulated Lower Body Bathing: Sit to/from stand;Min guard Lower Body Bathing Details (indicate cue type and reason): simulated Upper Body Dressing : Set up;Sitting   Lower Body Dressing: Sit to/from stand;Min guard   Toilet Transfer: Supervision/safety;Ambulation;BSC/3in1;Min guard Armed forces technical officer Details (indicate cue type and reason): simulated Toileting- Clothing Manipulation and Hygiene: Sit to/from stand;Min guard       Functional mobility during ADLs: Min guard (no device) General ADL Comments: Pt reports having a friend "Pamala Hurry" that helps with grocery shopping and other home management tasks.     Vision Baseline Vision/History: 0 No visual deficits;1 Wears glasses (near vision only) Ability to See in Adequate Light: 0 Adequate Patient Visual Report: No change from baseline Vision Assessment?: No apparent visual deficits     Perception Perception Perception: Within Functional Limits   Praxis Praxis Praxis: Intact    Pertinent Vitals/Pain Pain Assessment Pain Assessment: Faces Faces Pain Scale: No hurt     Hand Dominance Right   Extremity/Trunk Assessment Upper Extremity Assessment Upper Extremity Assessment:  Generalized weakness (shoulder flexion AROM 0-130 degrees with strength 3+/5 bilaterally.)   Lower Extremity Assessment Lower Extremity Assessment: Defer to PT evaluation   Cervical / Trunk Assessment Cervical / Trunk Assessment: Normal   Communication  Communication Communication: HOH   Cognition Arousal/Alertness: Awake/alert Behavior During Therapy: WFL for tasks assessed/performed Overall Cognitive Status: No family/caregiver present to determine baseline cognitive functioning                                 General Comments: Pt able to state month, place, and reason for hospitalization,  missed day of the week by 1 day"wednesday".  Decreased anticipatory awareness with history of falls but doesn't usually use an assistive device.                Home Living Family/patient expects to be discharged to:: Private residence Living Arrangements: Non-relatives/Friends Available Help at Discharge: Friend(s);Available PRN/intermittently Type of Home: House Home Access: Ramped entrance     Home Layout: Two level;Able to live on main level with bedroom/bathroom;Full bath on main level     Bathroom Shower/Tub: Occupational psychologist: Handicapped height     Home Equipment: International aid/development worker (2 wheels);Rollator (4 wheels) (lift chair)          Prior Functioning/Environment Prior Level of Function : Needs assist             Mobility Comments: Normally does not use AD ADLs Comments: Supervision for shower transfers and ADL tasks.        OT Problem List: Decreased strength;Impaired balance (sitting and/or standing);Decreased cognition;Decreased safety awareness      OT Treatment/Interventions: Self-care/ADL training;Balance training;Therapeutic exercise;Patient/family education;Therapeutic activities;DME and/or AE instruction    OT Goals(Current goals can be found in the care plan section) Acute Rehab OT Goals Patient Stated Goal: Pt wants to go back home OT Goal Formulation: With patient Time For Goal Achievement: 08/20/21 Potential to Achieve Goals: Good  OT Frequency: Min 2X/week       AM-PAC OT "6 Clicks" Daily Activity     Outcome Measure Help from another person  eating meals?: None Help from another person taking care of personal grooming?: A Little Help from another person toileting, which includes using toliet, bedpan, or urinal?: A Little Help from another person bathing (including washing, rinsing, drying)?: A Little Help from another person to put on and taking off regular upper body clothing?: None Help from another person to put on and taking off regular lower body clothing?: A Little 6 Click Score: 20   End of Session Equipment Utilized During Treatment: Gait belt Nurse Communication: Mobility status  Activity Tolerance: Patient tolerated treatment well Patient left: in chair;with call bell/phone within reach;with chair alarm set  OT Visit Diagnosis: Unsteadiness on feet (R26.81)                Time: 1037-1100 OT Time Calculation (min): 23 min Charges:  OT General Charges $OT Visit: 1 Visit OT Evaluation $OT Eval Moderate Complexity: 1 Mod OT Treatments $Self Care/Home Management : 8-22 mins  Chanese Hartsough OTR/L 08/06/2021, 11:18 AM

## 2021-08-06 NOTE — Care Management Important Message (Signed)
Important Message  Patient Details  Name: Brett Matthews MRN: [id number] Date of Birth: 03-Jan-1933   Medicare Important Message Given:  Yes     Orbie Pyo 08/06/2021, 3:41 PM

## 2021-08-06 NOTE — Progress Notes (Signed)
Patient discharge teaching given, including activity, diet, follow-up appoints, and medications. Patient verbalized understanding of all discharge instructions. IV access was d/c'd. Vitals are stable. Skin is intact except as charted in most recent assessments. Pt to be escorted out by NT, to be driven home by family. 

## 2021-08-09 DIAGNOSIS — Z8701 Personal history of pneumonia (recurrent): Secondary | ICD-10-CM | POA: Diagnosis not present

## 2021-08-09 DIAGNOSIS — Z09 Encounter for follow-up examination after completed treatment for conditions other than malignant neoplasm: Secondary | ICD-10-CM | POA: Diagnosis not present

## 2021-08-13 DIAGNOSIS — F39 Unspecified mood [affective] disorder: Secondary | ICD-10-CM | POA: Diagnosis not present

## 2021-08-13 DIAGNOSIS — G43909 Migraine, unspecified, not intractable, without status migrainosus: Secondary | ICD-10-CM | POA: Diagnosis not present

## 2021-08-13 DIAGNOSIS — J302 Other seasonal allergic rhinitis: Secondary | ICD-10-CM | POA: Diagnosis not present

## 2021-08-13 DIAGNOSIS — I10 Essential (primary) hypertension: Secondary | ICD-10-CM | POA: Diagnosis not present

## 2021-08-13 DIAGNOSIS — S82841D Displaced bimalleolar fracture of right lower leg, subsequent encounter for closed fracture with routine healing: Secondary | ICD-10-CM | POA: Diagnosis not present

## 2021-08-13 DIAGNOSIS — I2510 Atherosclerotic heart disease of native coronary artery without angina pectoris: Secondary | ICD-10-CM | POA: Diagnosis not present

## 2021-08-13 DIAGNOSIS — K219 Gastro-esophageal reflux disease without esophagitis: Secondary | ICD-10-CM | POA: Diagnosis not present

## 2021-08-13 DIAGNOSIS — D509 Iron deficiency anemia, unspecified: Secondary | ICD-10-CM | POA: Diagnosis not present

## 2021-08-13 DIAGNOSIS — Z8616 Personal history of COVID-19: Secondary | ICD-10-CM | POA: Diagnosis not present

## 2021-08-13 DIAGNOSIS — Z9181 History of falling: Secondary | ICD-10-CM | POA: Diagnosis not present

## 2021-08-13 DIAGNOSIS — G4700 Insomnia, unspecified: Secondary | ICD-10-CM | POA: Diagnosis not present

## 2021-08-13 DIAGNOSIS — S82431D Displaced oblique fracture of shaft of right fibula, subsequent encounter for closed fracture with routine healing: Secondary | ICD-10-CM | POA: Diagnosis not present

## 2021-08-13 DIAGNOSIS — E119 Type 2 diabetes mellitus without complications: Secondary | ICD-10-CM | POA: Diagnosis not present

## 2021-08-13 DIAGNOSIS — N400 Enlarged prostate without lower urinary tract symptoms: Secondary | ICD-10-CM | POA: Diagnosis not present

## 2021-08-13 DIAGNOSIS — E785 Hyperlipidemia, unspecified: Secondary | ICD-10-CM | POA: Diagnosis not present

## 2021-08-13 DIAGNOSIS — K5900 Constipation, unspecified: Secondary | ICD-10-CM | POA: Diagnosis not present

## 2021-08-13 DIAGNOSIS — E039 Hypothyroidism, unspecified: Secondary | ICD-10-CM | POA: Diagnosis not present

## 2021-08-13 DIAGNOSIS — I480 Paroxysmal atrial fibrillation: Secondary | ICD-10-CM | POA: Diagnosis not present

## 2021-08-14 DIAGNOSIS — Z6830 Body mass index (BMI) 30.0-30.9, adult: Secondary | ICD-10-CM | POA: Diagnosis not present

## 2021-08-14 DIAGNOSIS — I10 Essential (primary) hypertension: Secondary | ICD-10-CM | POA: Diagnosis not present

## 2021-08-14 DIAGNOSIS — Z713 Dietary counseling and surveillance: Secondary | ICD-10-CM | POA: Diagnosis not present

## 2021-08-14 DIAGNOSIS — Z09 Encounter for follow-up examination after completed treatment for conditions other than malignant neoplasm: Secondary | ICD-10-CM | POA: Diagnosis not present

## 2021-08-14 DIAGNOSIS — Z299 Encounter for prophylactic measures, unspecified: Secondary | ICD-10-CM | POA: Diagnosis not present

## 2021-08-14 DIAGNOSIS — J189 Pneumonia, unspecified organism: Secondary | ICD-10-CM | POA: Diagnosis not present

## 2021-08-16 DIAGNOSIS — Z7901 Long term (current) use of anticoagulants: Secondary | ICD-10-CM | POA: Diagnosis not present

## 2021-08-16 DIAGNOSIS — Z9989 Dependence on other enabling machines and devices: Secondary | ICD-10-CM | POA: Diagnosis not present

## 2021-08-16 DIAGNOSIS — M170 Bilateral primary osteoarthritis of knee: Secondary | ICD-10-CM | POA: Diagnosis not present

## 2021-08-16 DIAGNOSIS — D692 Other nonthrombocytopenic purpura: Secondary | ICD-10-CM | POA: Diagnosis not present

## 2021-08-16 DIAGNOSIS — Z515 Encounter for palliative care: Secondary | ICD-10-CM | POA: Diagnosis not present

## 2021-08-19 ENCOUNTER — Ambulatory Visit: Payer: PPO | Admitting: Cardiology

## 2021-08-19 ENCOUNTER — Encounter: Payer: Self-pay | Admitting: Cardiology

## 2021-08-19 VITALS — BP 133/80 | HR 67 | Temp 98.0°F | Resp 16 | Ht 69.0 in | Wt 200.0 lb

## 2021-08-19 DIAGNOSIS — I2510 Atherosclerotic heart disease of native coronary artery without angina pectoris: Secondary | ICD-10-CM | POA: Diagnosis not present

## 2021-08-19 DIAGNOSIS — I10 Essential (primary) hypertension: Secondary | ICD-10-CM | POA: Diagnosis not present

## 2021-08-19 DIAGNOSIS — I480 Paroxysmal atrial fibrillation: Secondary | ICD-10-CM

## 2021-08-19 NOTE — Progress Notes (Signed)
Duplicate

## 2021-08-19 NOTE — Progress Notes (Signed)
Primary Physician/Referring:  Glenda Chroman, MD  Patient ID: Brett Matthews, male    DOB: 1932/09/11, 88 y.o.   MRN: [id number]   No chief complaint on file.  HPI:    Brett Matthews  is a 88 y.o.  Caucasian male with paroxysmal atrial fibrillation, hypertension, hyperlipidemia, controlled diabetes mellitus, moderate coronary artery disease in mid circumflex with 60% stenosis by angiography in 2013, presentation on 01/16/2019 with chest pain and dyspnea found to be in AF, SP direct-current cardioversion on 01/18/2019. Flecainide discontinued due to bradycardia. Due to recurrence of symptomatic atrial fibrillation underwent direct-current cardioversion on 03/20/2020 and converted to sinus rhythm with 1 shock.     He was seen 3 months ago for elevated blood pressure.  There has been issues with his medications however his family has gotten involved and he has been taking his medications as prescribed.  He has no specific complaints today states that he is doing well.  Also kind enough to bring his medication list.  Patient is accompanied by his friend and caretaker who contributes to the history.   Past Medical History:  Diagnosis Date   Anemia, iron deficiency    Negative capsule endoscopy   Arthritis    Coronary atherosclerosis of native coronary artery    60 % circumflex stenosis; EF 65%, Cath 6/13   Diverticula of colon    Pancolonic   DM (diabetes mellitus), type 2, uncontrolled    Dyspnea    Normal cardiopulmonary function test in July 2008, negative echocardiogram with "bubble" study for inter-cardiac shunt.   Essential hypertension, benign    Gastroesophageal reflux disease    Hemorrhoids    Hyperplastic colon polyp 04/06/2007   Hypothyroidism 2003   Status post total thyroidectomy   Memory difficulties    Migraines    Neoplasm of lymphatic and hematopoietic tissue    Paroxysmal atrial fibrillation ([place])    Diagnosed 2005   Skin cancer     Social History   Tobacco Use    Smoking status: Never   Smokeless tobacco: Never  Substance Use Topics   Alcohol use: No  Marital Status: Divorced  ROS  Review of Systems  Cardiovascular:  Negative for chest pain, dyspnea on exertion and leg swelling.   Objective      08/19/2021    3:48 PM 08/06/2021    9:41 AM 08/06/2021    6:49 AM  Vitals with BMI  Height '5\' 9"'$     Weight 200 lbs    BMI 00.86    Systolic 761 950 932  Diastolic 80 93 671  Pulse 67 78 76    Physical Exam Vitals reviewed.  Neck:     Vascular: No carotid bruit or JVD.  Cardiovascular:     Rate and Rhythm: Normal rate and regular rhythm.     Pulses: Normal pulses and intact distal pulses.     Heart sounds: Normal heart sounds, S1 normal and S2 normal. No murmur heard.    No gallop.  Pulmonary:     Effort: Pulmonary effort is normal. No respiratory distress.     Breath sounds: Normal breath sounds. No wheezing, rhonchi or rales.  Abdominal:     General: Bowel sounds are normal.     Palpations: Abdomen is soft.  Musculoskeletal:     Right lower leg: No edema.     Left lower leg: No edema.    Laboratory examination:   Recent Labs    08/03/21 2006 08/03/21 2018 08/05/21 0257  08/06/21 0730  NA 138 139 137 138  K 3.6 3.6 3.6 3.2*  CL 103  --  102 105  CO2 26  --  28 27  GLUCOSE 162*  --  132* 147*  BUN 14  --  15 8  CREATININE 1.04  --  1.06 0.90  CALCIUM 9.6  --  9.6 9.5  GFRNONAA >60  --  >60 >60   estimated creatinine clearance is 64.4 mL/min (by C-G formula based on SCr of 0.9 mg/dL).     Latest Ref Rng & Units 08/06/2021    7:30 AM 08/05/2021    2:57 AM 08/03/2021    8:18 PM  CMP  Glucose 70 - 99 mg/dL 147  132    BUN 8 - 23 mg/dL 8  15    Creatinine 0.61 - 1.24 mg/dL 0.90  1.06    Sodium 135 - 145 mmol/L 138  137  139   Potassium 3.5 - 5.1 mmol/L 3.2  3.6  3.6   Chloride 98 - 111 mmol/L 105  102    CO2 22 - 32 mmol/L 27  28    Calcium 8.9 - 10.3 mg/dL 9.5  9.6    Total Protein 6.5 - 8.1 g/dL 5.6  5.5    Total  Bilirubin 0.3 - 1.2 mg/dL 0.3  0.2    Alkaline Phos 38 - 126 U/L 91  84    AST 15 - 41 U/L 14  16    ALT 0 - 44 U/L 14  15        Latest Ref Rng & Units 08/06/2021    7:30 AM 08/05/2021    2:57 AM 08/04/2021    6:55 AM  CBC  WBC 4.0 - 10.5 K/uL 8.1  11.6  15.9   Hemoglobin 13.0 - 17.0 g/dL 10.5  10.2  10.1   Hematocrit 39.0 - 52.0 % 32.8  32.5  31.9   Platelets 150 - 400 K/uL 202  199  221    HEMOGLOBIN A1C Lab Results  Component Value Date   HGBA1C 6.1 (H) 03/19/2021   MPG 128 03/19/2021   External Labs:  Labs 12/13/2020:  HDL 34, LDL 62, total cholesterol 124, triglycerides 165  A1c 6.5%  Allergies  No Known Allergies   Final Medications at End of Visit     Current Outpatient Medications:    acetaminophen (TYLENOL) 500 MG tablet, Take 2 tablets (1,000 mg total) by mouth every 6 (six) hours as needed for mild pain or moderate pain., Disp: 60 tablet, Rfl: 0   albuterol (VENTOLIN HFA) 108 (90 Base) MCG/ACT inhaler, Inhale 1-2 puffs into the lungs every 6 (six) hours as needed for wheezing or shortness of breath., Disp: , Rfl:    amLODipine (NORVASC) 10 MG tablet, Take 1 tablet (10 mg total) by mouth daily., Disp: 30 tablet, Rfl: 0   atorvastatin (LIPITOR) 10 MG tablet, Take 1 tablet (10 mg total) by mouth daily., Disp: 90 tablet, Rfl: 2   carvedilol (COREG) 25 MG tablet, Take 1 tablet (25 mg total) by mouth 2 (two) times daily with a meal., Disp: 60 tablet, Rfl: 0   Cholecalciferol (D3-1000) 25 MCG (1000 UT) capsule, Take 1,000 Units by mouth every evening., Disp: , Rfl:    citalopram (CELEXA) 20 MG tablet, Take 1 tablet (20 mg total) by mouth at bedtime. (Patient taking differently: Take 40 mg by mouth at bedtime.), Disp: , Rfl:    diltiazem (CARDIZEM CD) 240 MG 24 hr capsule,  Take 240 mg by mouth daily., Disp: , Rfl:    docusate sodium (COLACE) 100 MG capsule, Take 100 mg by mouth every evening., Disp: , Rfl:    famotidine (PEPCID) 40 MG tablet, Take 40 mg by mouth daily.,  Disp: , Rfl:    fluticasone (FLONASE) 50 MCG/ACT nasal spray, Place 1 spray into both nostrils daily as needed for allergies., Disp: , Rfl:    folic acid (FOLVITE) 1 MG tablet, Take 1 mg by mouth daily., Disp: , Rfl:    gabapentin (NEURONTIN) 300 MG capsule, Take 300 mg by mouth 3 (three) times daily., Disp: , Rfl:    glimepiride (AMARYL) 2 MG tablet, Take 2 mg by mouth 2 (two) times daily., Disp: , Rfl: 9   hydrALAZINE (APRESOLINE) 25 MG tablet, Take 1 tablet (25 mg total) by mouth 3 (three) times daily as needed (As needed for BP >140/90)., Disp: 90 tablet, Rfl: 3   Insulin Degludec (TRESIBA) 100 UNIT/ML SOLN, Inject 18 Units into the skin daily., Disp: , Rfl:    levothyroxine (SYNTHROID) 125 MCG tablet, Take 125 mcg by mouth daily at 2 am., Disp: , Rfl:    Melatonin 10 MG TABS, Take 10 mg by mouth at bedtime., Disp: , Rfl:    metFORMIN (GLUCOPHAGE) 500 MG tablet, Take 500-1,000 mg by mouth See admin instructions. Take 2 tabs (1,'000mg'$ ) by mouth every morning & 1 tab ('500mg'$ ) every evening, Disp: , Rfl:    pantoprazole (PROTONIX) 40 MG tablet, Take 40 mg by mouth daily at 12 noon., Disp: , Rfl:    polyethylene glycol (MIRALAX / GLYCOLAX) 17 g packet, Take 17 g by mouth daily., Disp: , Rfl:    rivaroxaban (XARELTO) 20 MG TABS tablet, Take 1 tablet (20 mg total) by mouth daily with supper., Disp: 30 tablet, Rfl: 0   senna-docusate (SENOKOT-S) 8.6-50 MG tablet, Take 1 tablet by mouth 2 (two) times daily as needed for moderate constipation., Disp: , Rfl:    tamsulosin (FLOMAX) 0.4 MG CAPS capsule, Take 0.4 mg by mouth daily after breakfast., Disp: , Rfl:    vitamin B-12 (CYANOCOBALAMIN) 1000 MCG tablet, Take 1,000 mcg by mouth daily., Disp: , Rfl:    zinc gluconate 50 MG tablet, Take 50 mg by mouth daily., Disp: , Rfl:   Radiology:   Chest x-ray PA and lateral view 11/08/2018: Normal chest x-ray with no active lung disease.  Cardiac Studies:   Coronary  angiogram 06/27/2011: Mild left main disease,  mid circumflex 60% stenosis.  Mild luminal irregularity of the RCA and normal LAD.  LVEF 65%.  Lexiscan Tetrofosmin Stress Test 12/27/2018: Nondiagnostic ECG stress. Normal myocardial perfusion. Stress LV EF: 58%.  No previous exam available for comparison. Low risk study.   PCV ECHOCARDIOGRAM COMPLETE 02/02/2020 Rhythm: Atrial Flutter. Normal LV systolic function with visual EF 50-55%. Left ventricle cavity is normal in size. Normal global wall motion. Unable to evaluate diastolic function due to atrial flutter. Elevated LAP. Calculated EF 62%. Left atrial cavity is mildly dilated. Right atrial cavity is mildly dilated. Mild (Grade I) mitral regurgitation. Mild tricuspid regurgitation. Compared to prior study dated 12/09/2018: Biatrial enlargement is new, LVEF 65% and now 50-55%, otherwise no significant change.  EKG  EKG 08/19/2021: Normal sinus rhythm at rate of 66 bpm, left atrial enlargement, normal axis.  Poor R wave progression, probably normal variant.  Low-voltage complexes.  Nonspecific T abnormality.  EKG 02/04/2021: Sinus rhythm at a rate of 65 bpm.  Left axis, left anterior fascicular block.  Right bundle branch block.  No evidence of ischemia or underlying injury pattern.  Baseline artifact, no significant change from 11/02/2020.   EKG 02/01/2020: Atypical atrial flutter with variable AV conduction at rate of 80 bpm, left axis deviation, left anterior fascicular block.  Right bundle branch block.  Low-voltage complexes.  Pulmonary disease pattern.     Assessment     ICD-10-CM   1. Paroxysmal atrial fibrillation (HCC)  I48.0     2. Coronary artery disease involving native coronary artery of native heart without angina pectoris  I25.10     3. Essential hypertension, benign  I10       CHA2DS2-VASc Score is 4.  Yearly risk of stroke: 4.8% (A, HTN, CAD).  Score of 1=0.6; 2=2.2; 3=3.2; 4=4.8; 5=7.2; 6=9.8; 7=>9.8) -(CHF; HTN; vasc disease DM,  Male = 1; Age <65 =0; 65-74 =  1,  >75 =2; stroke/embolism= 2).   No orders of the defined types were placed in this encounter.  There are no discontinued medications.  Recommendations:   Brett Matthews  is a 88 y.o. Caucasian male with paroxysmal atrial fibrillation, hypertension, hyperlipidemia, controlled diabetes mellitus, moderate coronary artery disease in mid circumflex with 60% stenosis by angiography in 2013, presentation on 01/16/2019 with chest pain and dyspnea found to be in AF, SP direct-current cardioversion on 01/18/2019. Flecainide discontinued due to bradycardia. Due to recurrence of symptomatic atrial fibrillation underwent direct-current cardioversion on 03/20/2020 and converted to sinus rhythm with 1 shock.     He was seen 3 months ago for elevated blood pressure.  There has been issues with his medications however his family has gotten involved and he has been taking his medications as prescribed.  He has no specific complaints today states that he is doing well.  Also kind enough to bring his medication list.  He is presently doing well and is maintaining sinus rhythm by auscultation.  His blood pressure is also well controlled.  Since he has been compliant with taking his medications appropriately, there is no clinical evidence of heart failure, fortunately maintaining sinus rhythm and no recent hospitalization.  No changes in the medications were done today, I will see him back in 6 months for follow-up.   Adrian Prows, PA-C 08/19/2021, 9:04 PM Office: [phone]

## 2021-08-22 DIAGNOSIS — Z6830 Body mass index (BMI) 30.0-30.9, adult: Secondary | ICD-10-CM | POA: Diagnosis not present

## 2021-08-22 DIAGNOSIS — B351 Tinea unguium: Secondary | ICD-10-CM | POA: Diagnosis not present

## 2021-08-22 DIAGNOSIS — L84 Corns and callosities: Secondary | ICD-10-CM | POA: Diagnosis not present

## 2021-08-22 DIAGNOSIS — E1142 Type 2 diabetes mellitus with diabetic polyneuropathy: Secondary | ICD-10-CM | POA: Diagnosis not present

## 2021-08-22 DIAGNOSIS — M25421 Effusion, right elbow: Secondary | ICD-10-CM | POA: Diagnosis not present

## 2021-08-22 DIAGNOSIS — Z299 Encounter for prophylactic measures, unspecified: Secondary | ICD-10-CM | POA: Diagnosis not present

## 2021-08-22 DIAGNOSIS — I10 Essential (primary) hypertension: Secondary | ICD-10-CM | POA: Diagnosis not present

## 2021-08-22 DIAGNOSIS — K5900 Constipation, unspecified: Secondary | ICD-10-CM | POA: Diagnosis not present

## 2021-08-22 DIAGNOSIS — M25422 Effusion, left elbow: Secondary | ICD-10-CM | POA: Diagnosis not present

## 2021-08-22 DIAGNOSIS — M79676 Pain in unspecified toe(s): Secondary | ICD-10-CM | POA: Diagnosis not present

## 2021-08-28 DIAGNOSIS — N400 Enlarged prostate without lower urinary tract symptoms: Secondary | ICD-10-CM | POA: Diagnosis not present

## 2021-08-28 DIAGNOSIS — Z8616 Personal history of COVID-19: Secondary | ICD-10-CM | POA: Diagnosis not present

## 2021-08-28 DIAGNOSIS — G43909 Migraine, unspecified, not intractable, without status migrainosus: Secondary | ICD-10-CM | POA: Diagnosis not present

## 2021-08-28 DIAGNOSIS — I10 Essential (primary) hypertension: Secondary | ICD-10-CM | POA: Diagnosis not present

## 2021-08-28 DIAGNOSIS — K5900 Constipation, unspecified: Secondary | ICD-10-CM | POA: Diagnosis not present

## 2021-08-28 DIAGNOSIS — E119 Type 2 diabetes mellitus without complications: Secondary | ICD-10-CM | POA: Diagnosis not present

## 2021-08-28 DIAGNOSIS — D509 Iron deficiency anemia, unspecified: Secondary | ICD-10-CM | POA: Diagnosis not present

## 2021-08-28 DIAGNOSIS — I480 Paroxysmal atrial fibrillation: Secondary | ICD-10-CM | POA: Diagnosis not present

## 2021-08-28 DIAGNOSIS — I2510 Atherosclerotic heart disease of native coronary artery without angina pectoris: Secondary | ICD-10-CM | POA: Diagnosis not present

## 2021-08-28 DIAGNOSIS — G4700 Insomnia, unspecified: Secondary | ICD-10-CM | POA: Diagnosis not present

## 2021-08-28 DIAGNOSIS — S82841D Displaced bimalleolar fracture of right lower leg, subsequent encounter for closed fracture with routine healing: Secondary | ICD-10-CM | POA: Diagnosis not present

## 2021-08-28 DIAGNOSIS — K219 Gastro-esophageal reflux disease without esophagitis: Secondary | ICD-10-CM | POA: Diagnosis not present

## 2021-08-28 DIAGNOSIS — J302 Other seasonal allergic rhinitis: Secondary | ICD-10-CM | POA: Diagnosis not present

## 2021-08-28 DIAGNOSIS — F39 Unspecified mood [affective] disorder: Secondary | ICD-10-CM | POA: Diagnosis not present

## 2021-08-28 DIAGNOSIS — E039 Hypothyroidism, unspecified: Secondary | ICD-10-CM | POA: Diagnosis not present

## 2021-08-28 DIAGNOSIS — S82431D Displaced oblique fracture of shaft of right fibula, subsequent encounter for closed fracture with routine healing: Secondary | ICD-10-CM | POA: Diagnosis not present

## 2021-08-28 DIAGNOSIS — Z9181 History of falling: Secondary | ICD-10-CM | POA: Diagnosis not present

## 2021-08-28 DIAGNOSIS — E785 Hyperlipidemia, unspecified: Secondary | ICD-10-CM | POA: Diagnosis not present

## 2021-08-30 DIAGNOSIS — T7840XA Allergy, unspecified, initial encounter: Secondary | ICD-10-CM | POA: Diagnosis not present

## 2021-08-30 DIAGNOSIS — I10 Essential (primary) hypertension: Secondary | ICD-10-CM | POA: Diagnosis not present

## 2021-08-30 DIAGNOSIS — Z299 Encounter for prophylactic measures, unspecified: Secondary | ICD-10-CM | POA: Diagnosis not present

## 2021-09-05 DIAGNOSIS — R0989 Other specified symptoms and signs involving the circulatory and respiratory systems: Secondary | ICD-10-CM | POA: Diagnosis not present

## 2021-09-05 DIAGNOSIS — I10 Essential (primary) hypertension: Secondary | ICD-10-CM | POA: Diagnosis not present

## 2021-09-05 DIAGNOSIS — J069 Acute upper respiratory infection, unspecified: Secondary | ICD-10-CM | POA: Diagnosis not present

## 2021-09-05 DIAGNOSIS — M170 Bilateral primary osteoarthritis of knee: Secondary | ICD-10-CM | POA: Diagnosis not present

## 2021-09-05 DIAGNOSIS — Z299 Encounter for prophylactic measures, unspecified: Secondary | ICD-10-CM | POA: Diagnosis not present

## 2021-09-06 DIAGNOSIS — I4510 Unspecified right bundle-branch block: Secondary | ICD-10-CM | POA: Diagnosis not present

## 2021-09-06 DIAGNOSIS — R509 Fever, unspecified: Secondary | ICD-10-CM | POA: Diagnosis not present

## 2021-09-06 DIAGNOSIS — R0689 Other abnormalities of breathing: Secondary | ICD-10-CM | POA: Diagnosis not present

## 2021-09-06 DIAGNOSIS — R0902 Hypoxemia: Secondary | ICD-10-CM | POA: Diagnosis not present

## 2021-09-06 DIAGNOSIS — I10 Essential (primary) hypertension: Secondary | ICD-10-CM | POA: Diagnosis not present

## 2021-09-08 DIAGNOSIS — U071 COVID-19: Secondary | ICD-10-CM | POA: Diagnosis not present

## 2021-09-09 DIAGNOSIS — F419 Anxiety disorder, unspecified: Secondary | ICD-10-CM | POA: Diagnosis not present

## 2021-09-09 DIAGNOSIS — Z7984 Long term (current) use of oral hypoglycemic drugs: Secondary | ICD-10-CM | POA: Diagnosis not present

## 2021-09-09 DIAGNOSIS — U071 COVID-19: Secondary | ICD-10-CM | POA: Diagnosis not present

## 2021-09-09 DIAGNOSIS — I10 Essential (primary) hypertension: Secondary | ICD-10-CM | POA: Diagnosis not present

## 2021-09-09 DIAGNOSIS — Z79890 Hormone replacement therapy (postmenopausal): Secondary | ICD-10-CM | POA: Diagnosis not present

## 2021-09-09 DIAGNOSIS — E119 Type 2 diabetes mellitus without complications: Secondary | ICD-10-CM | POA: Diagnosis not present

## 2021-09-09 DIAGNOSIS — E785 Hyperlipidemia, unspecified: Secondary | ICD-10-CM | POA: Diagnosis not present

## 2021-09-09 DIAGNOSIS — E079 Disorder of thyroid, unspecified: Secondary | ICD-10-CM | POA: Diagnosis not present

## 2021-09-09 DIAGNOSIS — Z885 Allergy status to narcotic agent status: Secondary | ICD-10-CM | POA: Diagnosis not present

## 2021-09-09 DIAGNOSIS — Z87891 Personal history of nicotine dependence: Secondary | ICD-10-CM | POA: Diagnosis not present

## 2021-09-09 DIAGNOSIS — F32A Depression, unspecified: Secondary | ICD-10-CM | POA: Diagnosis not present

## 2021-09-09 DIAGNOSIS — Z79899 Other long term (current) drug therapy: Secondary | ICD-10-CM | POA: Diagnosis not present

## 2021-09-09 DIAGNOSIS — R0602 Shortness of breath: Secondary | ICD-10-CM | POA: Diagnosis not present

## 2021-09-10 DIAGNOSIS — Z299 Encounter for prophylactic measures, unspecified: Secondary | ICD-10-CM | POA: Diagnosis not present

## 2021-09-10 DIAGNOSIS — U071 COVID-19: Secondary | ICD-10-CM | POA: Diagnosis not present

## 2021-09-10 DIAGNOSIS — I10 Essential (primary) hypertension: Secondary | ICD-10-CM | POA: Diagnosis not present

## 2021-09-10 DIAGNOSIS — I4891 Unspecified atrial fibrillation: Secondary | ICD-10-CM | POA: Diagnosis not present

## 2021-09-10 DIAGNOSIS — E1142 Type 2 diabetes mellitus with diabetic polyneuropathy: Secondary | ICD-10-CM | POA: Diagnosis not present

## 2021-09-16 DIAGNOSIS — U071 COVID-19: Secondary | ICD-10-CM | POA: Diagnosis not present

## 2021-09-19 DIAGNOSIS — Z6830 Body mass index (BMI) 30.0-30.9, adult: Secondary | ICD-10-CM | POA: Diagnosis not present

## 2021-09-19 DIAGNOSIS — Z794 Long term (current) use of insulin: Secondary | ICD-10-CM | POA: Diagnosis not present

## 2021-09-19 DIAGNOSIS — Z23 Encounter for immunization: Secondary | ICD-10-CM | POA: Diagnosis not present

## 2021-09-19 DIAGNOSIS — E1142 Type 2 diabetes mellitus with diabetic polyneuropathy: Secondary | ICD-10-CM | POA: Diagnosis not present

## 2021-09-19 DIAGNOSIS — E1165 Type 2 diabetes mellitus with hyperglycemia: Secondary | ICD-10-CM | POA: Diagnosis not present

## 2021-09-19 DIAGNOSIS — I10 Essential (primary) hypertension: Secondary | ICD-10-CM | POA: Diagnosis not present

## 2021-09-19 DIAGNOSIS — Z299 Encounter for prophylactic measures, unspecified: Secondary | ICD-10-CM | POA: Diagnosis not present

## 2021-09-20 DIAGNOSIS — Z7901 Long term (current) use of anticoagulants: Secondary | ICD-10-CM | POA: Diagnosis not present

## 2021-09-20 DIAGNOSIS — Z8616 Personal history of COVID-19: Secondary | ICD-10-CM | POA: Diagnosis not present

## 2021-09-20 DIAGNOSIS — M170 Bilateral primary osteoarthritis of knee: Secondary | ICD-10-CM | POA: Diagnosis not present

## 2021-09-20 DIAGNOSIS — Z6829 Body mass index (BMI) 29.0-29.9, adult: Secondary | ICD-10-CM | POA: Diagnosis not present

## 2021-09-20 DIAGNOSIS — Z515 Encounter for palliative care: Secondary | ICD-10-CM | POA: Diagnosis not present

## 2021-09-20 DIAGNOSIS — D692 Other nonthrombocytopenic purpura: Secondary | ICD-10-CM | POA: Diagnosis not present

## 2021-10-15 DIAGNOSIS — R296 Repeated falls: Secondary | ICD-10-CM | POA: Diagnosis not present

## 2021-10-15 DIAGNOSIS — E119 Type 2 diabetes mellitus without complications: Secondary | ICD-10-CM | POA: Diagnosis not present

## 2021-10-15 DIAGNOSIS — I10 Essential (primary) hypertension: Secondary | ICD-10-CM | POA: Diagnosis not present

## 2021-10-15 DIAGNOSIS — Z6830 Body mass index (BMI) 30.0-30.9, adult: Secondary | ICD-10-CM | POA: Diagnosis not present

## 2021-10-15 DIAGNOSIS — H40013 Open angle with borderline findings, low risk, bilateral: Secondary | ICD-10-CM | POA: Diagnosis not present

## 2021-10-15 DIAGNOSIS — H04123 Dry eye syndrome of bilateral lacrimal glands: Secondary | ICD-10-CM | POA: Diagnosis not present

## 2021-10-15 DIAGNOSIS — K5900 Constipation, unspecified: Secondary | ICD-10-CM | POA: Diagnosis not present

## 2021-10-15 DIAGNOSIS — H353132 Nonexudative age-related macular degeneration, bilateral, intermediate dry stage: Secondary | ICD-10-CM | POA: Diagnosis not present

## 2021-10-15 DIAGNOSIS — Z299 Encounter for prophylactic measures, unspecified: Secondary | ICD-10-CM | POA: Diagnosis not present

## 2021-10-15 DIAGNOSIS — H524 Presbyopia: Secondary | ICD-10-CM | POA: Diagnosis not present

## 2021-11-21 DIAGNOSIS — B351 Tinea unguium: Secondary | ICD-10-CM | POA: Diagnosis not present

## 2021-11-21 DIAGNOSIS — L84 Corns and callosities: Secondary | ICD-10-CM | POA: Diagnosis not present

## 2021-11-21 DIAGNOSIS — E1142 Type 2 diabetes mellitus with diabetic polyneuropathy: Secondary | ICD-10-CM | POA: Diagnosis not present

## 2021-11-21 DIAGNOSIS — M79676 Pain in unspecified toe(s): Secondary | ICD-10-CM | POA: Diagnosis not present

## 2021-12-10 DIAGNOSIS — B079 Viral wart, unspecified: Secondary | ICD-10-CM | POA: Diagnosis not present

## 2021-12-10 DIAGNOSIS — B078 Other viral warts: Secondary | ICD-10-CM | POA: Diagnosis not present

## 2021-12-10 DIAGNOSIS — D485 Neoplasm of uncertain behavior of skin: Secondary | ICD-10-CM | POA: Diagnosis not present

## 2021-12-10 DIAGNOSIS — I10 Essential (primary) hypertension: Secondary | ICD-10-CM | POA: Diagnosis not present

## 2021-12-10 DIAGNOSIS — Z299 Encounter for prophylactic measures, unspecified: Secondary | ICD-10-CM | POA: Diagnosis not present

## 2021-12-12 DIAGNOSIS — D692 Other nonthrombocytopenic purpura: Secondary | ICD-10-CM | POA: Diagnosis not present

## 2021-12-12 DIAGNOSIS — Z7901 Long term (current) use of anticoagulants: Secondary | ICD-10-CM | POA: Diagnosis not present

## 2021-12-12 DIAGNOSIS — Z6828 Body mass index (BMI) 28.0-28.9, adult: Secondary | ICD-10-CM | POA: Diagnosis not present

## 2021-12-12 DIAGNOSIS — E785 Hyperlipidemia, unspecified: Secondary | ICD-10-CM | POA: Diagnosis not present

## 2021-12-12 DIAGNOSIS — Z515 Encounter for palliative care: Secondary | ICD-10-CM | POA: Diagnosis not present

## 2021-12-16 DIAGNOSIS — Z87891 Personal history of nicotine dependence: Secondary | ICD-10-CM | POA: Diagnosis not present

## 2021-12-16 DIAGNOSIS — Z6829 Body mass index (BMI) 29.0-29.9, adult: Secondary | ICD-10-CM | POA: Diagnosis not present

## 2021-12-16 DIAGNOSIS — I700 Atherosclerosis of aorta: Secondary | ICD-10-CM | POA: Diagnosis not present

## 2021-12-16 DIAGNOSIS — Z1339 Encounter for screening examination for other mental health and behavioral disorders: Secondary | ICD-10-CM | POA: Diagnosis not present

## 2021-12-16 DIAGNOSIS — I10 Essential (primary) hypertension: Secondary | ICD-10-CM | POA: Diagnosis not present

## 2021-12-16 DIAGNOSIS — Z299 Encounter for prophylactic measures, unspecified: Secondary | ICD-10-CM | POA: Diagnosis not present

## 2021-12-16 DIAGNOSIS — F1320 Sedative, hypnotic or anxiolytic dependence, uncomplicated: Secondary | ICD-10-CM | POA: Diagnosis not present

## 2021-12-16 DIAGNOSIS — Z7189 Other specified counseling: Secondary | ICD-10-CM | POA: Diagnosis not present

## 2021-12-16 DIAGNOSIS — Z1331 Encounter for screening for depression: Secondary | ICD-10-CM | POA: Diagnosis not present

## 2021-12-16 DIAGNOSIS — Z0000 Encounter for general adult medical examination without abnormal findings: Secondary | ICD-10-CM | POA: Diagnosis not present

## 2021-12-24 DIAGNOSIS — Z125 Encounter for screening for malignant neoplasm of prostate: Secondary | ICD-10-CM | POA: Diagnosis not present

## 2021-12-24 DIAGNOSIS — E7800 Pure hypercholesterolemia, unspecified: Secondary | ICD-10-CM | POA: Diagnosis not present

## 2021-12-24 DIAGNOSIS — R5383 Other fatigue: Secondary | ICD-10-CM | POA: Diagnosis not present

## 2021-12-24 DIAGNOSIS — Z79899 Other long term (current) drug therapy: Secondary | ICD-10-CM | POA: Diagnosis not present

## 2021-12-27 DIAGNOSIS — I700 Atherosclerosis of aorta: Secondary | ICD-10-CM | POA: Diagnosis not present

## 2021-12-27 DIAGNOSIS — Z0000 Encounter for general adult medical examination without abnormal findings: Secondary | ICD-10-CM | POA: Diagnosis not present

## 2021-12-27 DIAGNOSIS — E1142 Type 2 diabetes mellitus with diabetic polyneuropathy: Secondary | ICD-10-CM | POA: Diagnosis not present

## 2021-12-27 DIAGNOSIS — Z299 Encounter for prophylactic measures, unspecified: Secondary | ICD-10-CM | POA: Diagnosis not present

## 2021-12-27 DIAGNOSIS — E1165 Type 2 diabetes mellitus with hyperglycemia: Secondary | ICD-10-CM | POA: Diagnosis not present

## 2021-12-27 DIAGNOSIS — F1320 Sedative, hypnotic or anxiolytic dependence, uncomplicated: Secondary | ICD-10-CM | POA: Diagnosis not present

## 2021-12-27 DIAGNOSIS — Z87891 Personal history of nicotine dependence: Secondary | ICD-10-CM | POA: Diagnosis not present

## 2021-12-27 DIAGNOSIS — I10 Essential (primary) hypertension: Secondary | ICD-10-CM | POA: Diagnosis not present

## 2022-01-08 DIAGNOSIS — I10 Essential (primary) hypertension: Secondary | ICD-10-CM | POA: Diagnosis not present

## 2022-01-08 DIAGNOSIS — Z299 Encounter for prophylactic measures, unspecified: Secondary | ICD-10-CM | POA: Diagnosis not present

## 2022-01-08 DIAGNOSIS — L570 Actinic keratosis: Secondary | ICD-10-CM | POA: Diagnosis not present

## 2022-01-30 DIAGNOSIS — B351 Tinea unguium: Secondary | ICD-10-CM | POA: Diagnosis not present

## 2022-01-30 DIAGNOSIS — L84 Corns and callosities: Secondary | ICD-10-CM | POA: Diagnosis not present

## 2022-01-30 DIAGNOSIS — M79676 Pain in unspecified toe(s): Secondary | ICD-10-CM | POA: Diagnosis not present

## 2022-01-30 DIAGNOSIS — E1142 Type 2 diabetes mellitus with diabetic polyneuropathy: Secondary | ICD-10-CM | POA: Diagnosis not present

## 2022-02-19 ENCOUNTER — Ambulatory Visit: Payer: PPO | Admitting: Cardiology

## 2022-03-04 ENCOUNTER — Encounter: Payer: Self-pay | Admitting: Cardiology

## 2022-03-04 ENCOUNTER — Ambulatory Visit: Payer: PPO | Admitting: Cardiology

## 2022-03-04 VITALS — BP 160/70 | HR 63 | Resp 16 | Ht 69.0 in | Wt 208.0 lb

## 2022-03-04 DIAGNOSIS — I2510 Atherosclerotic heart disease of native coronary artery without angina pectoris: Secondary | ICD-10-CM | POA: Diagnosis not present

## 2022-03-04 DIAGNOSIS — I10 Essential (primary) hypertension: Secondary | ICD-10-CM | POA: Diagnosis not present

## 2022-03-04 DIAGNOSIS — I480 Paroxysmal atrial fibrillation: Secondary | ICD-10-CM | POA: Diagnosis not present

## 2022-03-04 MED ORDER — HYDRALAZINE HCL 50 MG PO TABS: 50.0000 mg | ORAL_TABLET | Freq: Three times a day (TID) | ORAL | 3 refills | Status: DC

## 2022-03-04 NOTE — Progress Notes (Signed)
EKG 03/04/2022: Normal sinus rhythm at the rate of 61 bpm, left axis deviation, left anterior fascicular block.  Right bundle branch block.  No evidence of ischemia.  Compared to 02/04/2021, no significant change.

## 2022-03-04 NOTE — Progress Notes (Signed)
Primary Physician/Referring:  Glenda Chroman, MD  Patient ID: Brett Matthews, male    DOB: 10-13-1932, 89 y.o.   MRN: [id number]   Chief Complaint  Patient presents with   Atrial Fibrillation   Follow-up    6 month    HPI:    Brett Matthews  is a 89 y.o.  Caucasian male with paroxysmal atrial fibrillation, hypertension, hyperlipidemia, controlled diabetes mellitus, moderate coronary artery disease in mid circumflex with 60% stenosis by angiography in 2013, presentation on 01/16/2019 with chest pain and dyspnea found to be in AF, SP direct-current cardioversion on 01/18/2019. Flecainide discontinued due to bradycardia. Due to recurrence of symptomatic atrial fibrillation underwent direct-current cardioversion on 03/20/2020 and converted to sinus rhythm with 1 shock.     This is 9-monthoffice visit.  He has no specific complaints. Patient is accompanied by his friend Brett Matthews who contributes to the history.   Past Medical History:  Diagnosis Date   Anemia, iron deficiency    Negative capsule endoscopy   Arthritis    Coronary atherosclerosis of native coronary artery    60 % circumflex stenosis; EF 65%, Cath 6/13   Diverticula of colon    Pancolonic   DM (diabetes mellitus), type 2, uncontrolled    Dyspnea    Normal cardiopulmonary function test in July 2008, negative echocardiogram with "bubble" study for inter-cardiac shunt.   Essential hypertension, benign    Gastroesophageal reflux disease    Hemorrhoids    Hyperplastic colon polyp 04/06/2007   Hypothyroidism 2003   Status post total thyroidectomy   Memory difficulties    Migraines    Neoplasm of lymphatic and hematopoietic tissue    [place]    Diagnosed 2005   Skin cancer     Social History   Tobacco Use   Smoking status: Never   Smokeless tobacco: Never  Substance Use Topics   Alcohol use: No  Marital Status: Divorced  ROS  Review of Systems  Cardiovascular:  Negative  for chest pain, dyspnea on exertion and leg swelling.   Objective      03/04/2022    3:04 PM 03/04/2022    2:59 PM 08/19/2021    3:48 PM  Vitals with BMI  Height  '5\' 9"'$  '5\' 9"'$   Weight  208 lbs 200 lbs  BMI  [id number] Systolic [id number] Diastolic 70 96 80  Pulse 63 64 67    Physical Exam Vitals reviewed.  Neck:     Vascular: No carotid bruit or JVD.  Cardiovascular:     Rate and Rhythm: Normal rate and regular rhythm.     Pulses: Normal pulses and intact distal pulses.     Heart sounds: Normal heart sounds, S1 normal and S2 normal. No murmur heard.    No gallop.  Pulmonary:     Effort: Pulmonary effort is normal. No respiratory distress.     Breath sounds: Normal breath sounds. No wheezing, rhonchi or rales.  Abdominal:     General: Bowel sounds are normal.     Palpations: Abdomen is soft.  Musculoskeletal:     Right lower leg: No edema.     Left lower leg: No edema.    Laboratory examination:   Recent Labs    08/03/21 2006 08/03/21 2018 08/05/21 0257 08/06/21 0730  NA 138 139 137 138  K 3.6 3.6 3.6 3.2*  CL 103  --  102 105  CO2 26  --  28 27  GLUCOSE 162*  --  132* 147*  BUN 14  --  15 8  CREATININE 1.04  --  1.06 0.90  CALCIUM 9.6  --  9.6 9.5  GFRNONAA >60  --  >60 >60      Latest Ref Rng & Units 08/06/2021    7:30 AM 08/05/2021    2:57 AM 08/03/2021    8:18 PM  CMP  Glucose 70 - 99 mg/dL 147  132    BUN 8 - 23 mg/dL 8  15    Creatinine 0.61 - 1.24 mg/dL 0.90  1.06    Sodium 135 - 145 mmol/L 138  137  139   Potassium 3.5 - 5.1 mmol/L 3.2  3.6  3.6   Chloride 98 - 111 mmol/L 105  102    CO2 22 - 32 mmol/L 27  28    Calcium 8.9 - 10.3 mg/dL 9.5  9.6    Total Protein 6.5 - 8.1 g/dL 5.6  5.5    Total Bilirubin 0.3 - 1.2 mg/dL 0.3  0.2    Alkaline Phos 38 - 126 U/L 91  84    AST 15 - 41 U/L 14  16    ALT 0 - 44 U/L 14  15        Latest Ref Rng & Units 08/06/2021    7:30 AM 08/05/2021    2:57 AM 08/04/2021    6:55 AM  CBC  WBC 4.0 - 10.5 K/uL 8.1   11.6  15.9   Hemoglobin 13.0 - 17.0 g/dL 10.5  10.2  10.1   Hematocrit 39.0 - 52.0 % 32.8  32.5  31.9   Platelets 150 - 400 K/uL 202  199  221    HEMOGLOBIN A1C Lab Results  Component Value Date   HGBA1C 6.1 (H) 03/19/2021   MPG 128 03/19/2021   External Labs:  Labs 12/13/2020:  HDL 34, LDL 62, total cholesterol 124, triglycerides 165  A1c 6.5%  Allergies  No Known Allergies   Medications    Current Outpatient Medications:    acetaminophen (TYLENOL) 500 MG tablet, Take 2 tablets (1,000 mg total) by mouth every 6 (six) hours as needed for mild pain or moderate pain., Disp: 60 tablet, Rfl: 0   albuterol (VENTOLIN HFA) 108 (90 Base) MCG/ACT inhaler, Inhale 1-2 puffs into the lungs every 6 (six) hours as needed for wheezing or shortness of breath., Disp: , Rfl:    atorvastatin (LIPITOR) 10 MG tablet, Take 1 tablet (10 mg total) by mouth daily., Disp: 90 tablet, Rfl: 2   benazepril (LOTENSIN) 20 MG tablet, Take 20 mg by mouth 2 (two) times daily., Disp: , Rfl:    carvedilol (COREG) 25 MG tablet, Take 1 tablet (25 mg total) by mouth 2 (two) times daily with a meal., Disp: 60 tablet, Rfl: 0   cholecalciferol (VITAMIN D3) 25 MCG (1000 UNIT) tablet, Take 1,000 Units by mouth daily., Disp: , Rfl:    citalopram (CELEXA) 20 MG tablet, Take 1 tablet (20 mg total) by mouth at bedtime. (Patient taking differently: Take 40 mg by mouth at bedtime.), Disp: , Rfl:    diltiazem (CARDIZEM CD) 240 MG 24 hr capsule, Take 240 mg by mouth daily., Disp: , Rfl:    docusate sodium (COLACE) 100 MG capsule, Take 100 mg by mouth every evening., Disp: , Rfl:    famotidine (PEPCID) 40 MG tablet, Take 40 mg by mouth daily., Disp: , Rfl:    fluticasone (FLONASE) 50  MCG/ACT nasal spray, Place 1 spray into both nostrils daily as needed for allergies., Disp: , Rfl:    folic acid (FOLVITE) 1 MG tablet, Take 1 mg by mouth daily., Disp: , Rfl:    gabapentin (NEURONTIN) 300 MG capsule, Take 300 mg by mouth 3 (three)  times daily., Disp: , Rfl:    glimepiride (AMARYL) 2 MG tablet, Take 2 mg by mouth 2 (two) times daily., Disp: , Rfl: 9   Insulin Degludec (TRESIBA) 100 UNIT/ML SOLN, Inject 18 Units into the skin daily., Disp: , Rfl:    levocetirizine (XYZAL) 5 MG tablet, Take 5 mg by mouth at bedtime., Disp: , Rfl:    levothyroxine (SYNTHROID) 125 MCG tablet, Take 125 mcg by mouth daily at 2 am., Disp: , Rfl:    LINZESS 290 MCG CAPS capsule, Take 290 mcg by mouth daily., Disp: , Rfl:    Melatonin 10 MG TABS, Take 10 mg by mouth at bedtime., Disp: , Rfl:    metFORMIN (GLUCOPHAGE) 500 MG tablet, Take 500-1,000 mg by mouth See admin instructions. Take 2 tabs (1,'000mg'$ ) by mouth every morning & 1 tab ('500mg'$ ) every evening, Disp: , Rfl:    pantoprazole (PROTONIX) 40 MG tablet, Take 40 mg by mouth daily at 12 noon., Disp: , Rfl:    polyethylene glycol (MIRALAX / GLYCOLAX) 17 g packet, Take 17 g by mouth daily., Disp: , Rfl:    rivaroxaban (XARELTO) 20 MG TABS tablet, Take 1 tablet (20 mg total) by mouth daily with supper., Disp: 30 tablet, Rfl: 0   tamsulosin (FLOMAX) 0.4 MG CAPS capsule, Take 0.4 mg by mouth daily after breakfast., Disp: , Rfl:    vitamin B-12 (CYANOCOBALAMIN) 1000 MCG tablet, Take 1,000 mcg by mouth daily., Disp: , Rfl:    zinc gluconate 50 MG tablet, Take 50 mg by mouth daily., Disp: , Rfl:    hydrALAZINE (APRESOLINE) 50 MG tablet, Take 1 tablet (50 mg total) by mouth 3 (three) times daily., Disp: 270 tablet, Rfl: 3  Radiology:   Chest x-ray PA and lateral view 11/08/2018: Normal chest x-ray with no active lung disease.  Cardiac Studies:   Coronary  angiogram 06/27/2011: Mild left main disease, mid circumflex 60% stenosis.  Mild luminal irregularity of the RCA and normal LAD.  LVEF 65%.  Lexiscan Tetrofosmin Stress Test 12/27/2018: Nondiagnostic ECG stress. Normal myocardial perfusion. Stress LV EF: 58%.  No previous exam available for comparison. Low risk study.   PCV ECHOCARDIOGRAM  COMPLETE 02/02/2020 Rhythm: Atrial Flutter. Normal LV systolic function with visual EF 50-55%. Left ventricle cavity is normal in size. Normal global wall motion. Unable to evaluate diastolic function due to atrial flutter. Elevated LAP. Calculated EF 62%. Left atrial cavity is mildly dilated. Right atrial cavity is mildly dilated. Mild (Grade I) mitral regurgitation. Mild tricuspid regurgitation. Compared to prior study dated 12/09/2018: Biatrial enlargement is new, LVEF 65% and now 50-55%, otherwise no significant change.  EKG   EKG 03/04/2022: Normal sinus rhythm at the rate of 61 bpm, left axis deviation, left anterior fascicular block.  Right bundle branch block.  No evidence of ischemia.  Compared to 02/04/2021, no significant change.  EKG 02/01/2020: Atypical atrial flutter with variable AV conduction at rate of 80 bpm, left axis deviation, left anterior fascicular block.  Right bundle branch block.  Low-voltage complexes.  Pulmonary disease pattern.     Assessment     ICD-10-CM   1. Paroxysmal atrial fibrillation (HCC)  I48.0 EKG 12-Lead    2. Coronary  artery disease involving native coronary artery of native heart without angina pectoris  I25.10     3. Essential hypertension, benign  I10 hydrALAZINE (APRESOLINE) 50 MG tablet      CHA2DS2-VASc Score is 4.  Yearly risk of stroke: 4.8% (A, HTN, CAD).  Score of 1=0.6; 2=2.2; 3=3.2; 4=4.8; 5=7.2; 6=9.8; 7=>9.8) -(CHF; HTN; vasc disease DM,  Male = 1; Age <65 =0; 65-74 = 1,  >75 =2; stroke/embolism= 2).   Meds ordered this encounter  Medications   hydrALAZINE (APRESOLINE) 50 MG tablet    Sig: Take 1 tablet (50 mg total) by mouth 3 (three) times daily.    Dispense:  270 tablet    Refill:  3   Medications Discontinued During This Encounter  Medication Reason   Cholecalciferol (D3-1000) 25 MCG (1000 UT) capsule Patient Preference   senna-docusate (SENOKOT-S) 8.6-50 MG tablet Patient Preference   amLODipine (NORVASC) 10 MG tablet  Patient Preference   hydrALAZINE (APRESOLINE) 25 MG tablet Reorder    Recommendations:   ILIJA CHEZEM  is a 89 y.o. Caucasian male with paroxysmal atrial fibrillation, hypertension, hyperlipidemia, controlled diabetes mellitus, moderate coronary artery disease in mid circumflex with 60% stenosis by angiography in 2013, presentation on 01/16/2019 with chest pain and dyspnea found to be in AF, SP direct-current cardioversion on 01/18/2019. Flecainide discontinued due to bradycardia. Due to recurrence of symptomatic atrial fibrillation underwent direct-current cardioversion on 03/20/2020 and converted to sinus rhythm with 1 shock.     1. Paroxysmal atrial fibrillation [facility]) Patient is maintaining sinus rhythm.  Patient presently tolerating Xarelto 20 mg daily, continue the same, I do not have his recent labs, he will need close monitoring of CBC and CMP for surveillance.  2. Coronary artery disease involving native coronary artery of native heart without angina pectoris Moderate coronary disease by cardiac catheterization without angina pectoris, LDL goal <70.  3. Essential hypertension, benign Blood pressure was elevated today, will increase hydralazine from 25 mg 3 times daily as needed to 50 mg 3 times daily.  Clear-cut instructions were sent with the patient in the handwritten form.  No clinical evidence of heart failure, is presently doing well, 89 years of age, I will see him back in a year or sooner if problems.    Adrian Prows, MD, [facility] 03/04/2022, 3:37 PM Office: [phone] Fax: [phone] Pager: [phone]

## 2022-03-17 DIAGNOSIS — D6869 Other thrombophilia: Secondary | ICD-10-CM | POA: Diagnosis not present

## 2022-03-17 DIAGNOSIS — I739 Peripheral vascular disease, unspecified: Secondary | ICD-10-CM | POA: Diagnosis not present

## 2022-03-17 DIAGNOSIS — J309 Allergic rhinitis, unspecified: Secondary | ICD-10-CM | POA: Diagnosis not present

## 2022-03-17 DIAGNOSIS — I4891 Unspecified atrial fibrillation: Secondary | ICD-10-CM | POA: Diagnosis not present

## 2022-03-17 DIAGNOSIS — Z299 Encounter for prophylactic measures, unspecified: Secondary | ICD-10-CM | POA: Diagnosis not present

## 2022-03-20 DIAGNOSIS — S0083XA Contusion of other part of head, initial encounter: Secondary | ICD-10-CM | POA: Diagnosis not present

## 2022-03-20 DIAGNOSIS — I4891 Unspecified atrial fibrillation: Secondary | ICD-10-CM | POA: Diagnosis not present

## 2022-03-20 DIAGNOSIS — S8011XA Contusion of right lower leg, initial encounter: Secondary | ICD-10-CM | POA: Diagnosis not present

## 2022-03-20 DIAGNOSIS — S5011XA Contusion of right forearm, initial encounter: Secondary | ICD-10-CM | POA: Diagnosis not present

## 2022-03-20 DIAGNOSIS — Z4801 Encounter for change or removal of surgical wound dressing: Secondary | ICD-10-CM | POA: Diagnosis not present

## 2022-03-20 DIAGNOSIS — E079 Disorder of thyroid, unspecified: Secondary | ICD-10-CM | POA: Diagnosis not present

## 2022-03-20 DIAGNOSIS — S51811A Laceration without foreign body of right forearm, initial encounter: Secondary | ICD-10-CM | POA: Diagnosis not present

## 2022-03-20 DIAGNOSIS — Z043 Encounter for examination and observation following other accident: Secondary | ICD-10-CM | POA: Diagnosis not present

## 2022-03-20 DIAGNOSIS — S5001XA Contusion of right elbow, initial encounter: Secondary | ICD-10-CM | POA: Diagnosis not present

## 2022-03-20 DIAGNOSIS — S0990XA Unspecified injury of head, initial encounter: Secondary | ICD-10-CM | POA: Diagnosis not present

## 2022-03-20 DIAGNOSIS — Z7901 Long term (current) use of anticoagulants: Secondary | ICD-10-CM | POA: Diagnosis not present

## 2022-03-20 DIAGNOSIS — Z7984 Long term (current) use of oral hypoglycemic drugs: Secondary | ICD-10-CM | POA: Diagnosis not present

## 2022-03-20 DIAGNOSIS — N400 Enlarged prostate without lower urinary tract symptoms: Secondary | ICD-10-CM | POA: Diagnosis not present

## 2022-03-20 DIAGNOSIS — Z87891 Personal history of nicotine dependence: Secondary | ICD-10-CM | POA: Diagnosis not present

## 2022-03-20 DIAGNOSIS — S5011XD Contusion of right forearm, subsequent encounter: Secondary | ICD-10-CM | POA: Diagnosis not present

## 2022-03-20 DIAGNOSIS — I10 Essential (primary) hypertension: Secondary | ICD-10-CM | POA: Diagnosis not present

## 2022-03-21 DIAGNOSIS — Z299 Encounter for prophylactic measures, unspecified: Secondary | ICD-10-CM | POA: Diagnosis not present

## 2022-03-21 DIAGNOSIS — I10 Essential (primary) hypertension: Secondary | ICD-10-CM | POA: Diagnosis not present

## 2022-03-21 DIAGNOSIS — Z4801 Encounter for change or removal of surgical wound dressing: Secondary | ICD-10-CM | POA: Diagnosis not present

## 2022-03-21 DIAGNOSIS — S5011XD Contusion of right forearm, subsequent encounter: Secondary | ICD-10-CM | POA: Diagnosis not present

## 2022-03-21 DIAGNOSIS — W19XXXA Unspecified fall, initial encounter: Secondary | ICD-10-CM | POA: Diagnosis not present

## 2022-03-21 DIAGNOSIS — S41119A Laceration without foreign body of unspecified upper arm, initial encounter: Secondary | ICD-10-CM | POA: Diagnosis not present

## 2022-03-21 DIAGNOSIS — J069 Acute upper respiratory infection, unspecified: Secondary | ICD-10-CM | POA: Diagnosis not present

## 2022-03-24 DIAGNOSIS — Z299 Encounter for prophylactic measures, unspecified: Secondary | ICD-10-CM | POA: Diagnosis not present

## 2022-03-24 DIAGNOSIS — S41119A Laceration without foreign body of unspecified upper arm, initial encounter: Secondary | ICD-10-CM | POA: Diagnosis not present

## 2022-03-24 DIAGNOSIS — R296 Repeated falls: Secondary | ICD-10-CM | POA: Diagnosis not present

## 2022-03-24 DIAGNOSIS — I10 Essential (primary) hypertension: Secondary | ICD-10-CM | POA: Diagnosis not present

## 2022-03-26 DIAGNOSIS — E1140 Type 2 diabetes mellitus with diabetic neuropathy, unspecified: Secondary | ICD-10-CM | POA: Diagnosis not present

## 2022-03-26 DIAGNOSIS — I10 Essential (primary) hypertension: Secondary | ICD-10-CM | POA: Diagnosis not present

## 2022-03-26 DIAGNOSIS — S41119A Laceration without foreign body of unspecified upper arm, initial encounter: Secondary | ICD-10-CM | POA: Diagnosis not present

## 2022-03-26 DIAGNOSIS — Z299 Encounter for prophylactic measures, unspecified: Secondary | ICD-10-CM | POA: Diagnosis not present

## 2022-03-26 DIAGNOSIS — D692 Other nonthrombocytopenic purpura: Secondary | ICD-10-CM | POA: Diagnosis not present

## 2022-03-27 DIAGNOSIS — S41119A Laceration without foreign body of unspecified upper arm, initial encounter: Secondary | ICD-10-CM | POA: Diagnosis not present

## 2022-03-27 DIAGNOSIS — D6869 Other thrombophilia: Secondary | ICD-10-CM | POA: Diagnosis not present

## 2022-03-27 DIAGNOSIS — Z299 Encounter for prophylactic measures, unspecified: Secondary | ICD-10-CM | POA: Diagnosis not present

## 2022-03-27 DIAGNOSIS — I4891 Unspecified atrial fibrillation: Secondary | ICD-10-CM | POA: Diagnosis not present

## 2022-03-27 DIAGNOSIS — I10 Essential (primary) hypertension: Secondary | ICD-10-CM | POA: Diagnosis not present

## 2022-04-03 DIAGNOSIS — Z299 Encounter for prophylactic measures, unspecified: Secondary | ICD-10-CM | POA: Diagnosis not present

## 2022-04-03 DIAGNOSIS — I4891 Unspecified atrial fibrillation: Secondary | ICD-10-CM | POA: Diagnosis not present

## 2022-04-03 DIAGNOSIS — I10 Essential (primary) hypertension: Secondary | ICD-10-CM | POA: Diagnosis not present

## 2022-04-03 DIAGNOSIS — S41119A Laceration without foreign body of unspecified upper arm, initial encounter: Secondary | ICD-10-CM | POA: Diagnosis not present

## 2022-04-07 DIAGNOSIS — I10 Essential (primary) hypertension: Secondary | ICD-10-CM | POA: Diagnosis not present

## 2022-04-07 DIAGNOSIS — K5900 Constipation, unspecified: Secondary | ICD-10-CM | POA: Diagnosis not present

## 2022-04-07 DIAGNOSIS — Z299 Encounter for prophylactic measures, unspecified: Secondary | ICD-10-CM | POA: Diagnosis not present

## 2022-04-15 DIAGNOSIS — H40013 Open angle with borderline findings, low risk, bilateral: Secondary | ICD-10-CM | POA: Diagnosis not present

## 2022-04-16 DIAGNOSIS — I10 Essential (primary) hypertension: Secondary | ICD-10-CM | POA: Diagnosis not present

## 2022-04-16 DIAGNOSIS — I4891 Unspecified atrial fibrillation: Secondary | ICD-10-CM | POA: Diagnosis not present

## 2022-04-16 DIAGNOSIS — R0981 Nasal congestion: Secondary | ICD-10-CM | POA: Diagnosis not present

## 2022-04-17 DIAGNOSIS — L84 Corns and callosities: Secondary | ICD-10-CM | POA: Diagnosis not present

## 2022-04-17 DIAGNOSIS — M79676 Pain in unspecified toe(s): Secondary | ICD-10-CM | POA: Diagnosis not present

## 2022-04-17 DIAGNOSIS — E1142 Type 2 diabetes mellitus with diabetic polyneuropathy: Secondary | ICD-10-CM | POA: Diagnosis not present

## 2022-04-17 DIAGNOSIS — B351 Tinea unguium: Secondary | ICD-10-CM | POA: Diagnosis not present

## 2022-04-28 DIAGNOSIS — I700 Atherosclerosis of aorta: Secondary | ICD-10-CM | POA: Diagnosis not present

## 2022-04-28 DIAGNOSIS — I10 Essential (primary) hypertension: Secondary | ICD-10-CM | POA: Diagnosis not present

## 2022-04-28 DIAGNOSIS — F332 Major depressive disorder, recurrent severe without psychotic features: Secondary | ICD-10-CM | POA: Diagnosis not present

## 2022-04-28 DIAGNOSIS — Z87891 Personal history of nicotine dependence: Secondary | ICD-10-CM | POA: Diagnosis not present

## 2022-04-28 DIAGNOSIS — Z7189 Other specified counseling: Secondary | ICD-10-CM | POA: Diagnosis not present

## 2022-04-28 DIAGNOSIS — Z299 Encounter for prophylactic measures, unspecified: Secondary | ICD-10-CM | POA: Diagnosis not present

## 2022-04-28 DIAGNOSIS — Z1339 Encounter for screening examination for other mental health and behavioral disorders: Secondary | ICD-10-CM | POA: Diagnosis not present

## 2022-04-28 DIAGNOSIS — Z6830 Body mass index (BMI) 30.0-30.9, adult: Secondary | ICD-10-CM | POA: Diagnosis not present

## 2022-04-28 DIAGNOSIS — Z0000 Encounter for general adult medical examination without abnormal findings: Secondary | ICD-10-CM | POA: Diagnosis not present

## 2022-04-28 DIAGNOSIS — Z1331 Encounter for screening for depression: Secondary | ICD-10-CM | POA: Diagnosis not present

## 2022-04-28 DIAGNOSIS — I739 Peripheral vascular disease, unspecified: Secondary | ICD-10-CM | POA: Diagnosis not present

## 2022-05-09 DIAGNOSIS — I10 Essential (primary) hypertension: Secondary | ICD-10-CM | POA: Diagnosis not present

## 2022-05-09 DIAGNOSIS — Z299 Encounter for prophylactic measures, unspecified: Secondary | ICD-10-CM | POA: Diagnosis not present

## 2022-05-09 DIAGNOSIS — M25561 Pain in right knee: Secondary | ICD-10-CM | POA: Diagnosis not present

## 2022-05-19 DIAGNOSIS — M170 Bilateral primary osteoarthritis of knee: Secondary | ICD-10-CM | POA: Diagnosis not present

## 2022-05-21 DIAGNOSIS — I10 Essential (primary) hypertension: Secondary | ICD-10-CM | POA: Diagnosis not present

## 2022-05-21 DIAGNOSIS — Z299 Encounter for prophylactic measures, unspecified: Secondary | ICD-10-CM | POA: Diagnosis not present

## 2022-05-21 DIAGNOSIS — R0600 Dyspnea, unspecified: Secondary | ICD-10-CM | POA: Diagnosis not present

## 2022-05-21 DIAGNOSIS — I4891 Unspecified atrial fibrillation: Secondary | ICD-10-CM | POA: Diagnosis not present

## 2022-06-11 DIAGNOSIS — I10 Essential (primary) hypertension: Secondary | ICD-10-CM | POA: Diagnosis not present

## 2022-06-11 DIAGNOSIS — Z299 Encounter for prophylactic measures, unspecified: Secondary | ICD-10-CM | POA: Diagnosis not present

## 2022-06-11 DIAGNOSIS — L0231 Cutaneous abscess of buttock: Secondary | ICD-10-CM | POA: Diagnosis not present

## 2022-06-18 DIAGNOSIS — L0231 Cutaneous abscess of buttock: Secondary | ICD-10-CM | POA: Diagnosis not present

## 2022-06-20 DIAGNOSIS — F419 Anxiety disorder, unspecified: Secondary | ICD-10-CM | POA: Diagnosis not present

## 2022-06-20 DIAGNOSIS — Z85828 Personal history of other malignant neoplasm of skin: Secondary | ICD-10-CM | POA: Diagnosis not present

## 2022-06-20 DIAGNOSIS — I4891 Unspecified atrial fibrillation: Secondary | ICD-10-CM | POA: Diagnosis not present

## 2022-06-20 DIAGNOSIS — M1990 Unspecified osteoarthritis, unspecified site: Secondary | ICD-10-CM | POA: Diagnosis not present

## 2022-06-20 DIAGNOSIS — Z86010 Personal history of colonic polyps: Secondary | ICD-10-CM | POA: Diagnosis not present

## 2022-06-20 DIAGNOSIS — I10 Essential (primary) hypertension: Secondary | ICD-10-CM | POA: Diagnosis not present

## 2022-06-20 DIAGNOSIS — E890 Postprocedural hypothyroidism: Secondary | ICD-10-CM | POA: Diagnosis not present

## 2022-06-20 DIAGNOSIS — L0231 Cutaneous abscess of buttock: Secondary | ICD-10-CM | POA: Diagnosis not present

## 2022-06-20 DIAGNOSIS — Z87891 Personal history of nicotine dependence: Secondary | ICD-10-CM | POA: Diagnosis not present

## 2022-06-20 DIAGNOSIS — Z885 Allergy status to narcotic agent status: Secondary | ICD-10-CM | POA: Diagnosis not present

## 2022-06-20 DIAGNOSIS — E785 Hyperlipidemia, unspecified: Secondary | ICD-10-CM | POA: Diagnosis not present

## 2022-06-20 DIAGNOSIS — N400 Enlarged prostate without lower urinary tract symptoms: Secondary | ICD-10-CM | POA: Diagnosis not present

## 2022-06-20 DIAGNOSIS — K219 Gastro-esophageal reflux disease without esophagitis: Secondary | ICD-10-CM | POA: Diagnosis not present

## 2022-06-20 DIAGNOSIS — F32A Depression, unspecified: Secondary | ICD-10-CM | POA: Diagnosis not present

## 2022-06-20 DIAGNOSIS — E119 Type 2 diabetes mellitus without complications: Secondary | ICD-10-CM | POA: Diagnosis not present

## 2022-06-20 DIAGNOSIS — I2510 Atherosclerotic heart disease of native coronary artery without angina pectoris: Secondary | ICD-10-CM | POA: Diagnosis not present

## 2022-06-20 DIAGNOSIS — Z794 Long term (current) use of insulin: Secondary | ICD-10-CM | POA: Diagnosis not present

## 2022-06-23 DIAGNOSIS — Z7984 Long term (current) use of oral hypoglycemic drugs: Secondary | ICD-10-CM | POA: Diagnosis not present

## 2022-06-23 DIAGNOSIS — E119 Type 2 diabetes mellitus without complications: Secondary | ICD-10-CM | POA: Diagnosis not present

## 2022-06-23 DIAGNOSIS — I10 Essential (primary) hypertension: Secondary | ICD-10-CM | POA: Diagnosis not present

## 2022-06-23 DIAGNOSIS — Z7901 Long term (current) use of anticoagulants: Secondary | ICD-10-CM | POA: Diagnosis not present

## 2022-06-23 DIAGNOSIS — Z87891 Personal history of nicotine dependence: Secondary | ICD-10-CM | POA: Diagnosis not present

## 2022-06-23 DIAGNOSIS — K219 Gastro-esophageal reflux disease without esophagitis: Secondary | ICD-10-CM | POA: Diagnosis not present

## 2022-06-23 DIAGNOSIS — L0231 Cutaneous abscess of buttock: Secondary | ICD-10-CM | POA: Diagnosis not present

## 2022-06-23 DIAGNOSIS — E785 Hyperlipidemia, unspecified: Secondary | ICD-10-CM | POA: Diagnosis not present

## 2022-06-23 DIAGNOSIS — Z885 Allergy status to narcotic agent status: Secondary | ICD-10-CM | POA: Diagnosis not present

## 2022-06-23 DIAGNOSIS — N400 Enlarged prostate without lower urinary tract symptoms: Secondary | ICD-10-CM | POA: Diagnosis not present

## 2022-06-23 DIAGNOSIS — I4891 Unspecified atrial fibrillation: Secondary | ICD-10-CM | POA: Diagnosis not present

## 2022-06-23 DIAGNOSIS — Z79899 Other long term (current) drug therapy: Secondary | ICD-10-CM | POA: Diagnosis not present

## 2022-06-26 DIAGNOSIS — M79676 Pain in unspecified toe(s): Secondary | ICD-10-CM | POA: Diagnosis not present

## 2022-06-26 DIAGNOSIS — E1142 Type 2 diabetes mellitus with diabetic polyneuropathy: Secondary | ICD-10-CM | POA: Diagnosis not present

## 2022-06-26 DIAGNOSIS — B351 Tinea unguium: Secondary | ICD-10-CM | POA: Diagnosis not present

## 2022-06-26 DIAGNOSIS — L84 Corns and callosities: Secondary | ICD-10-CM | POA: Diagnosis not present

## 2022-06-30 DIAGNOSIS — E1165 Type 2 diabetes mellitus with hyperglycemia: Secondary | ICD-10-CM | POA: Diagnosis not present

## 2022-06-30 DIAGNOSIS — Z299 Encounter for prophylactic measures, unspecified: Secondary | ICD-10-CM | POA: Diagnosis not present

## 2022-06-30 DIAGNOSIS — M545 Low back pain, unspecified: Secondary | ICD-10-CM | POA: Diagnosis not present

## 2022-06-30 DIAGNOSIS — E1142 Type 2 diabetes mellitus with diabetic polyneuropathy: Secondary | ICD-10-CM | POA: Diagnosis not present

## 2022-06-30 DIAGNOSIS — I10 Essential (primary) hypertension: Secondary | ICD-10-CM | POA: Diagnosis not present

## 2022-07-04 DIAGNOSIS — S51011A Laceration without foreign body of right elbow, initial encounter: Secondary | ICD-10-CM | POA: Diagnosis not present

## 2022-07-04 DIAGNOSIS — L0231 Cutaneous abscess of buttock: Secondary | ICD-10-CM | POA: Diagnosis not present

## 2022-07-18 DIAGNOSIS — S51011A Laceration without foreign body of right elbow, initial encounter: Secondary | ICD-10-CM | POA: Diagnosis not present

## 2022-07-18 DIAGNOSIS — L0231 Cutaneous abscess of buttock: Secondary | ICD-10-CM | POA: Diagnosis not present

## 2022-08-20 NOTE — Telephone Encounter (Signed)
done

## 2022-08-24 DIAGNOSIS — W010XXA Fall on same level from slipping, tripping and stumbling without subsequent striking against object, initial encounter: Secondary | ICD-10-CM | POA: Diagnosis not present

## 2022-08-24 DIAGNOSIS — I10 Essential (primary) hypertension: Secondary | ICD-10-CM | POA: Diagnosis not present

## 2022-08-24 DIAGNOSIS — I4891 Unspecified atrial fibrillation: Secondary | ICD-10-CM | POA: Diagnosis not present

## 2022-08-24 DIAGNOSIS — M19021 Primary osteoarthritis, right elbow: Secondary | ICD-10-CM | POA: Diagnosis not present

## 2022-08-24 DIAGNOSIS — Z87891 Personal history of nicotine dependence: Secondary | ICD-10-CM | POA: Diagnosis not present

## 2022-08-24 DIAGNOSIS — M19031 Primary osteoarthritis, right wrist: Secondary | ICD-10-CM | POA: Diagnosis not present

## 2022-08-24 DIAGNOSIS — E785 Hyperlipidemia, unspecified: Secondary | ICD-10-CM | POA: Diagnosis not present

## 2022-08-24 DIAGNOSIS — E119 Type 2 diabetes mellitus without complications: Secondary | ICD-10-CM | POA: Diagnosis not present

## 2022-08-24 DIAGNOSIS — S51811A Laceration without foreign body of right forearm, initial encounter: Secondary | ICD-10-CM | POA: Diagnosis not present

## 2022-08-24 DIAGNOSIS — F419 Anxiety disorder, unspecified: Secondary | ICD-10-CM | POA: Diagnosis not present

## 2022-08-24 DIAGNOSIS — S51801A Unspecified open wound of right forearm, initial encounter: Secondary | ICD-10-CM | POA: Diagnosis not present

## 2022-08-24 DIAGNOSIS — Z79899 Other long term (current) drug therapy: Secondary | ICD-10-CM | POA: Diagnosis not present

## 2022-08-24 DIAGNOSIS — I2510 Atherosclerotic heart disease of native coronary artery without angina pectoris: Secondary | ICD-10-CM | POA: Diagnosis not present

## 2022-08-28 DIAGNOSIS — Z6830 Body mass index (BMI) 30.0-30.9, adult: Secondary | ICD-10-CM | POA: Diagnosis not present

## 2022-08-28 DIAGNOSIS — S51801S Unspecified open wound of right forearm, sequela: Secondary | ICD-10-CM | POA: Diagnosis not present

## 2022-08-28 DIAGNOSIS — I10 Essential (primary) hypertension: Secondary | ICD-10-CM | POA: Diagnosis not present

## 2022-08-28 DIAGNOSIS — Z299 Encounter for prophylactic measures, unspecified: Secondary | ICD-10-CM | POA: Diagnosis not present

## 2022-09-11 DIAGNOSIS — B351 Tinea unguium: Secondary | ICD-10-CM | POA: Diagnosis not present

## 2022-09-11 DIAGNOSIS — M79603 Pain in arm, unspecified: Secondary | ICD-10-CM | POA: Diagnosis not present

## 2022-09-11 DIAGNOSIS — M79676 Pain in unspecified toe(s): Secondary | ICD-10-CM | POA: Diagnosis not present

## 2022-09-11 DIAGNOSIS — I10 Essential (primary) hypertension: Secondary | ICD-10-CM | POA: Diagnosis not present

## 2022-09-11 DIAGNOSIS — T148XXA Other injury of unspecified body region, initial encounter: Secondary | ICD-10-CM | POA: Diagnosis not present

## 2022-09-11 DIAGNOSIS — L84 Corns and callosities: Secondary | ICD-10-CM | POA: Diagnosis not present

## 2022-09-11 DIAGNOSIS — Z299 Encounter for prophylactic measures, unspecified: Secondary | ICD-10-CM | POA: Diagnosis not present

## 2022-09-11 DIAGNOSIS — E1142 Type 2 diabetes mellitus with diabetic polyneuropathy: Secondary | ICD-10-CM | POA: Diagnosis not present

## 2022-09-15 DIAGNOSIS — Z299 Encounter for prophylactic measures, unspecified: Secondary | ICD-10-CM | POA: Diagnosis not present

## 2022-09-15 DIAGNOSIS — I10 Essential (primary) hypertension: Secondary | ICD-10-CM | POA: Diagnosis not present

## 2022-09-15 DIAGNOSIS — S41102A Unspecified open wound of left upper arm, initial encounter: Secondary | ICD-10-CM | POA: Diagnosis not present

## 2022-09-22 DIAGNOSIS — R5383 Other fatigue: Secondary | ICD-10-CM | POA: Diagnosis not present

## 2022-09-22 DIAGNOSIS — I10 Essential (primary) hypertension: Secondary | ICD-10-CM | POA: Diagnosis not present

## 2022-09-22 DIAGNOSIS — Z299 Encounter for prophylactic measures, unspecified: Secondary | ICD-10-CM | POA: Diagnosis not present

## 2022-09-22 DIAGNOSIS — L98499 Non-pressure chronic ulcer of skin of other sites with unspecified severity: Secondary | ICD-10-CM | POA: Diagnosis not present

## 2022-10-01 DIAGNOSIS — Z299 Encounter for prophylactic measures, unspecified: Secondary | ICD-10-CM | POA: Diagnosis not present

## 2022-10-01 DIAGNOSIS — I10 Essential (primary) hypertension: Secondary | ICD-10-CM | POA: Diagnosis not present

## 2022-10-01 DIAGNOSIS — L98499 Non-pressure chronic ulcer of skin of other sites with unspecified severity: Secondary | ICD-10-CM | POA: Diagnosis not present

## 2022-10-01 DIAGNOSIS — Z23 Encounter for immunization: Secondary | ICD-10-CM | POA: Diagnosis not present

## 2022-10-08 DIAGNOSIS — E1142 Type 2 diabetes mellitus with diabetic polyneuropathy: Secondary | ICD-10-CM | POA: Diagnosis not present

## 2022-10-08 DIAGNOSIS — I10 Essential (primary) hypertension: Secondary | ICD-10-CM | POA: Diagnosis not present

## 2022-10-08 DIAGNOSIS — Z299 Encounter for prophylactic measures, unspecified: Secondary | ICD-10-CM | POA: Diagnosis not present

## 2022-10-08 DIAGNOSIS — Z794 Long term (current) use of insulin: Secondary | ICD-10-CM | POA: Diagnosis not present

## 2022-10-10 DIAGNOSIS — Z299 Encounter for prophylactic measures, unspecified: Secondary | ICD-10-CM | POA: Diagnosis not present

## 2022-10-10 DIAGNOSIS — I700 Atherosclerosis of aorta: Secondary | ICD-10-CM | POA: Diagnosis not present

## 2022-10-10 DIAGNOSIS — K130 Diseases of lips: Secondary | ICD-10-CM | POA: Diagnosis not present

## 2022-10-10 DIAGNOSIS — I4891 Unspecified atrial fibrillation: Secondary | ICD-10-CM | POA: Diagnosis not present

## 2022-10-10 DIAGNOSIS — I10 Essential (primary) hypertension: Secondary | ICD-10-CM | POA: Diagnosis not present

## 2022-10-15 DIAGNOSIS — H40013 Open angle with borderline findings, low risk, bilateral: Secondary | ICD-10-CM | POA: Diagnosis not present

## 2022-10-15 DIAGNOSIS — H353132 Nonexudative age-related macular degeneration, bilateral, intermediate dry stage: Secondary | ICD-10-CM | POA: Diagnosis not present

## 2022-10-15 DIAGNOSIS — Z299 Encounter for prophylactic measures, unspecified: Secondary | ICD-10-CM | POA: Diagnosis not present

## 2022-10-15 DIAGNOSIS — H04123 Dry eye syndrome of bilateral lacrimal glands: Secondary | ICD-10-CM | POA: Diagnosis not present

## 2022-10-15 DIAGNOSIS — E119 Type 2 diabetes mellitus without complications: Secondary | ICD-10-CM | POA: Diagnosis not present

## 2022-10-15 DIAGNOSIS — K130 Diseases of lips: Secondary | ICD-10-CM | POA: Diagnosis not present

## 2022-10-15 DIAGNOSIS — I10 Essential (primary) hypertension: Secondary | ICD-10-CM | POA: Diagnosis not present

## 2022-10-15 DIAGNOSIS — H35372 Puckering of macula, left eye: Secondary | ICD-10-CM | POA: Diagnosis not present

## 2022-10-27 DIAGNOSIS — Z0000 Encounter for general adult medical examination without abnormal findings: Secondary | ICD-10-CM | POA: Diagnosis not present

## 2022-10-27 DIAGNOSIS — R5383 Other fatigue: Secondary | ICD-10-CM | POA: Diagnosis not present

## 2022-10-27 DIAGNOSIS — Z299 Encounter for prophylactic measures, unspecified: Secondary | ICD-10-CM | POA: Diagnosis not present

## 2022-10-27 DIAGNOSIS — E7800 Pure hypercholesterolemia, unspecified: Secondary | ICD-10-CM | POA: Diagnosis not present

## 2022-10-27 DIAGNOSIS — Z125 Encounter for screening for malignant neoplasm of prostate: Secondary | ICD-10-CM | POA: Diagnosis not present

## 2022-10-27 DIAGNOSIS — Z79899 Other long term (current) drug therapy: Secondary | ICD-10-CM | POA: Diagnosis not present

## 2022-10-27 DIAGNOSIS — I10 Essential (primary) hypertension: Secondary | ICD-10-CM | POA: Diagnosis not present

## 2022-11-10 ENCOUNTER — Encounter: Payer: Self-pay | Admitting: Internal Medicine

## 2022-11-10 ENCOUNTER — Ambulatory Visit (INDEPENDENT_AMBULATORY_CARE_PROVIDER_SITE_OTHER): Payer: PPO | Admitting: Internal Medicine

## 2022-11-10 VITALS — BP 151/73 | HR 51 | Temp 97.5°F | Ht 68.0 in | Wt 203.2 lb

## 2022-11-10 DIAGNOSIS — K219 Gastro-esophageal reflux disease without esophagitis: Secondary | ICD-10-CM | POA: Diagnosis not present

## 2022-11-10 DIAGNOSIS — D509 Iron deficiency anemia, unspecified: Secondary | ICD-10-CM

## 2022-11-10 DIAGNOSIS — D649 Anemia, unspecified: Secondary | ICD-10-CM | POA: Diagnosis not present

## 2022-11-10 DIAGNOSIS — K5909 Other constipation: Secondary | ICD-10-CM

## 2022-11-10 NOTE — Progress Notes (Unsigned)
Primary Care Physician:  Ignatius Specking, MD Primary Gastroenterologist:  Dr. Jena Gauss  Pre-Procedure History & Physical: HPI:  Brett Matthews is a 89 y.o. male here for for further evaluation of anemia.  Outside labs  -CBC from June of this year hemoglobin/hematocrit 8.5 /27. 7, MCV 78; iron studies.  No more recent labs.  Patient has had lifelong constipation unchanged she has tried MiraLAX and Linzess at different times.  He denies melena or rectal bleeding.  No abdominal pain reflux well-controlled status post Nissen fundoplication Protonix 40 mg once daily.  No dysphagia.  Appetite remains good.  He had a couple adenomas removed out of his colon in 2019.  There were small.  No future colonoscopy recommended he was 85 at that time.  He remains anticoagulated on Xarelto due to atrial fibrillation.  Multiple comorbidities.  He was admitted with pneumonia and sepsis last year. Has a moderate level of activity.  He does live alone but he has caregivers who check on him frequently.  Past Medical History:  Diagnosis Date   Anemia, iron deficiency    Negative capsule endoscopy   Arthritis    Coronary atherosclerosis of native coronary artery    60 % circumflex stenosis; EF 65%, Cath 6/13   Diverticula of colon    Pancolonic   DM (diabetes mellitus), type 2, uncontrolled    Dyspnea    Normal cardiopulmonary function test in July 2008, negative echocardiogram with "bubble" study for inter-cardiac shunt.   Essential hypertension, benign    Gastroesophageal reflux disease    Hemorrhoids    Hyperplastic colon polyp 04/06/2007   Hypothyroidism 2003   Status post total thyroidectomy   Memory difficulties    Migraines    Neoplasm of lymphatic and hematopoietic tissue    Paroxysmal atrial fibrillation (HCC)    Diagnosed 2005   Skin cancer     Past Surgical History:  Procedure Laterality Date   BACK SURGERY     BONE MARROW BIOPSY  1990's   CARDIAC CATHETERIZATION     CARDIOVERSION N/A  01/18/2019   Procedure: CARDIOVERSION;  Surgeon: Yates Decamp, MD;  Location[facility] ENDOSCOPY;  Service: Cardiovascular;  Laterality: N/A;   CARDIOVERSION N/A 03/20/2020   Procedure: CARDIOVERSION;  Surgeon: Yates Decamp, MD;  Location: [facility] ENDOSCOPY;  Service: Cardiovascular;  Laterality: N/A;   CATARACT EXTRACTION Bilateral    COLONOSCOPY  04/06/2007   Dr. Jena Gauss- Normal rectum with scattered pancolonic diverticula and slightly redundant elongated colon, diminutive polpy midsigmoid, remainder of colonic mucosa appeared normal. bx= hyperplastic polyp   COLONOSCOPY N/A 11/29/2012   GMW:[id number] preparation. Friable anal canal/internal hemorrhoids; otherwise, normal rectum. Normal-appearing colonic mucosa   COLONOSCOPY WITH PROPOFOL N/A 03/12/2017   Procedure: COLONOSCOPY WITH PROPOFOL;  Surgeon: [name], MD;  Location: [facility] ENDO SUITE;  Service: Endoscopy;  Laterality: N/A;  7:30am   ESOPHAGOGASTRODUODENOSCOPY  04/06/2007   Dr. Jena Gauss- normal esophagus s/p nissen fundoplication, intact nissen wrap o/w normal stomach   ESOPHAGOGASTRODUODENOSCOPY N/A 11/29/2012   RMR: Abnormall distal esophagus bx c/w GERD. prior fundoplication. Gastric polyps  -bx benign. Status post biopsy of normal--appearing duodenal mucosa (bx neg)   HEMORRHOID SURGERY N/A 04/06/2017   Procedure: EXTENSIVE HEMORRHOIDECTOMY;  Surgeon: Franky Macho, MD;  Location: [facility] ORS;  Service: General;  Laterality: N/A;   LEFT HEART CATHETERIZATION WITH CORONARY ANGIOGRAM N/A 06/27/2011   Procedure: LEFT HEART CATHETERIZATION WITH CORONARY ANGIOGRAM;  Surgeon: Vesta Mixer, MD;  Location[facility] CATH LAB;  Service: Cardiovascular;  Laterality:  N/A;   NISSEN FUNDOPLICATION     ORIF ANKLE FRACTURE Right 03/19/2021   Procedure: OPEN REDUCTION INTERNAL FIXATION (ORIF) ANKLE FRACTURE, COLATERAL ANKLE LIGAMENT REPAIR;  Surgeon: Sheral Apley, MD;  Location: [facility] ORS;  Service: Orthopedics;  Laterality: Right;   POLYPECTOMY  03/12/2017   Procedure:  POLYPECTOMY;  Surgeon: [name], MD;  Location: [facility] ENDO SUITE;  Service: Endoscopy;;  cecal x3   POSTERIOR LAMINECTOMY / DECOMPRESSION LUMBAR SPINE  ~ 1990's   SKIN CANCER EXCISION     "off my back and chest; from sun"    Prior to Admission medications   Medication Sig Start Date End Date Taking? Authorizing Provider  acetaminophen (TYLENOL) 500 MG tablet Take 2 tablets (1,000 mg total) by mouth every 6 (six) hours as needed for mild pain or moderate pain. 03/22/21  Yes Gawne, Meghan M, PA-C  albuterol (VENTOLIN HFA) 108 (90 Base) MCG/ACT inhaler Inhale 1-2 puffs into the lungs every 6 (six) hours as needed for wheezing or shortness of breath.   Yes [provider]  atorvastatin (LIPITOR) 10 MG tablet Take 1 tablet (10 mg total) by mouth daily. 02/02/20  Yes Yates Decamp, MD  benazepril (LOTENSIN) 20 MG tablet Take 20 mg by mouth 2 (two) times daily. 02/07/22  Yes [provider]  carvedilol (COREG) 25 MG tablet Take 1 tablet (25 mg total) by mouth 2 (two) times daily with a meal. 08/06/21 11/10/22 Yes Jerald Kief, MD  cholecalciferol (VITAMIN D3) 25 MCG (1000 UNIT) tablet Take 1,000 Units by mouth daily.   Yes [provider]  diltiazem (CARDIZEM CD) 240 MG 24 hr capsule Take 240 mg by mouth daily.   Yes [provider]  docusate sodium (COLACE) 100 MG capsule Take 100 mg by mouth every evening.   Yes [provider]  famotidine (PEPCID) 40 MG tablet Take 40 mg by mouth daily. 03/04/20  Yes [provider]  fluticasone (FLONASE) 50 MCG/ACT nasal spray Place 1 spray into both nostrils daily as needed for allergies.   Yes [provider]  folic acid (FOLVITE) 1 MG tablet Take 1 mg by mouth daily.   Yes [provider]  gabapentin (NEURONTIN) 300 MG capsule Take 300 mg by mouth 3 (three) times daily.   Yes [provider]  glimepiride (AMARYL) 2 MG tablet Take 2 mg by mouth 2 (two) times daily. 03/10/17  Yes [provider]  hydrALAZINE (APRESOLINE) 50 MG tablet Take 1 tablet (50 mg total) by mouth 3 (three) times daily. 03/04/22 02/27/23 Yes Yates Decamp, MD  Insulin Degludec (TRESIBA) 100 UNIT/ML SOLN Inject 18 Units into the skin daily.   Yes [provider]  levocetirizine (XYZAL) 5 MG tablet Take 5 mg by mouth at bedtime. 02/07/22  Yes [provider]  levothyroxine (SYNTHROID) 125 MCG tablet Take 125 mcg by mouth daily at 2 am.   Yes [provider]  LINZESS 290 MCG CAPS capsule Take 290 mcg by mouth daily. 02/07/22  Yes [provider]  Melatonin 10 MG TABS Take 10 mg by mouth at bedtime.   Yes [provider]  metFORMIN (GLUCOPHAGE) 500 MG tablet Take 500-1,000 mg by mouth See admin instructions. Take 2 tabs (1,000mg ) by mouth every morning & 1 tab (500mg ) every evening   Yes [provider]  pantoprazole (PROTONIX) 40 MG tablet Take 40 mg by mouth daily at 12 noon.   Yes [provider]  polyethylene glycol (MIRALAX / GLYCOLAX) 17  g packet Take 17 g by mouth daily.   Yes [provider]  rivaroxaban (XARELTO) 20 MG TABS tablet Take 1 tablet (20 mg total) by mouth daily with supper. 08/06/21 11/10/22 Yes Jerald Kief, MD  tamsulosin [facility]) 0.4 MG CAPS capsule Take 0.4 mg by mouth daily after breakfast. 09/09/12  Yes [provider]  vitamin B-12 (CYANOCOBALAMIN) 1000 MCG tablet Take 1,000 mcg by mouth daily.   Yes [provider]  zinc gluconate 50 MG tablet Take 50 mg by mouth daily.   Yes [provider]    Allergies as of 11/10/2022   (No Known Allergies)    Family History  Problem Relation Age of Onset   Hypertension Mother    Stroke Mother    Cancer Father    Cancer Sister    Stroke Brother    Cancer Other    Stroke Other    Colon cancer Neg Hx     Social History   Socioeconomic History   Marital status: Divorced    Spouse name: Not on file   Number of children: 2   Years of  education: Not on file   Highest education level: Not on file  Occupational History   Occupation: Full Time  Tobacco Use   Smoking status: Never   Smokeless tobacco: Never  Vaping Use   Vaping status: Never Used  Substance and Sexual Activity   Alcohol use: No   Drug use: No   Sexual activity: Never  Other Topics Concern   Not on file  Social History Narrative   Not on file   Social Determinants of Health   Financial Resource Strain: Not on file  Food Insecurity: Not on file  Transportation Needs: Not on file  Physical Activity: Not on file  Stress: Not on file  Social Connections: Not on file  Intimate Partner Violence: Not on file    Review of Systems: See HPI, otherwise negative ROS  Physical Exam: BP (!) 151/73 (BP Location: Right Arm, Patient Position: Sitting, Cuff Size: Large)   Pulse (!) 51   Temp (!) 97.5 F (36.4 C) (Oral)   Ht 5\' 8"  (1.727 m)   Wt 203 lb 3.2 oz (92.2 kg)   SpO2 95%   BMI 30.90 kg/m  General:   Somewhat frail-appearing, elderly gentleman who ambulates with a [place].  He is accompanied by his caregiver, Isabel Caprice.  He is jovial and a jokester throughout the encounter today  Lungs:  Clear throughout to auscultation.   No wheezes, crackles, or rhonchi. No acute distress. Heart:  Regular rate and rhythm; no murmurs, clicks, rubs,  or gallops. Abdomen: Non-distended, normal bowel sounds.  Soft and nontender without appreciable mass or hepatosplenomegaly.   Impression/Plan: 89 year old gentleman with multiple comorbidities including atrial fibrillation on Xarelto is referred for microcytic anemia.  I do not have recent labs or any iron studies.  Clinically, he is really devoid of any active GI symptoms.  Lifelong chronic constipation.  GERD well-controlled on once daily PPI and prior fundoplication. Further evaluation may be warranted.  Recommendations:  FOBT ASAP  Updated CBC, iron, IBC, ferritin  If iron deficient and/or positive F0 PT,  will need further GI evaluation starting with colonoscopy at a minimum.  Further recommendations to follow in the very near future.     Notice: This dictation was prepared with Dragon dictation along with smaller phrase technology. Any transcriptional errors that result from this process are unintentional and may not be corrected  upon review.

## 2022-11-10 NOTE — Patient Instructions (Signed)
Was nice to see you again!  Need updated labs: CBC, iron, IBC, serum ferritin, iron saturation.  Needs stool IFOBT testing  -  all ASAP  If iron deficiency is confirmed and/or you have blood in your stool we will need to pursue further evaluation starting with a colonoscopy  For constipation it is hard to beat 1 or 2 capfuls of MiraLAX daily  Further recommendations to follow.

## 2022-11-11 LAB — CBC WITH DIFFERENTIAL/PLATELET
Absolute Lymphocytes: 1354 {cells}/uL (ref 850–3900)
Absolute Monocytes: 718 {cells}/uL (ref 200–950)
Basophils Absolute: 89 {cells}/uL (ref 0–200)
Basophils Relative: 1.2 %
Eosinophils Absolute: 237 {cells}/uL (ref 15–500)
Eosinophils Relative: 3.2 %
HCT: 30 % — ABNORMAL LOW (ref 38.5–50.0)
Hemoglobin: 8.8 g/dL — ABNORMAL LOW (ref 13.2–17.1)
MCH: 21.9 pg — ABNORMAL LOW (ref 27.0–33.0)
MCHC: 29.3 g/dL — ABNORMAL LOW (ref 32.0–36.0)
MCV: 74.6 fL — ABNORMAL LOW (ref 80.0–100.0)
MPV: 9.2 fL (ref 7.5–12.5)
Monocytes Relative: 9.7 %
Neutro Abs: 5002 {cells}/uL (ref 1500–7800)
Neutrophils Relative %: 67.6 %
Platelets: 349 10*3/uL (ref 140–400)
RBC: 4.02 10*6/uL — ABNORMAL LOW (ref 4.20–5.80)
RDW: 15.3 % — ABNORMAL HIGH (ref 11.0–15.0)
Total Lymphocyte: 18.3 %
WBC: 7.4 10*3/uL (ref 3.8–10.8)

## 2022-11-11 LAB — IRON,TIBC AND FERRITIN PANEL
%SAT: 5 % — ABNORMAL LOW (ref 20–48)
Ferritin: 9 ng/mL — ABNORMAL LOW (ref 24–380)
Iron: 21 ug/dL — ABNORMAL LOW (ref 50–180)
TIBC: 428 ug/dL — ABNORMAL HIGH (ref 250–425)

## 2022-11-11 NOTE — Addendum Note (Signed)
Addended by: Zada Finders on: 11/11/2022 04:00 PM   Modules accepted: Orders

## 2022-11-13 ENCOUNTER — Other Ambulatory Visit: Payer: Self-pay

## 2022-11-13 ENCOUNTER — Ambulatory Visit: Payer: PPO | Admitting: Internal Medicine

## 2022-11-13 DIAGNOSIS — D509 Iron deficiency anemia, unspecified: Secondary | ICD-10-CM | POA: Diagnosis not present

## 2022-11-13 DIAGNOSIS — D649 Anemia, unspecified: Secondary | ICD-10-CM

## 2022-11-13 LAB — IFOBT (OCCULT BLOOD): IFOBT: NEGATIVE

## 2022-11-17 ENCOUNTER — Telehealth: Payer: Self-pay | Admitting: *Deleted

## 2022-11-17 NOTE — Telephone Encounter (Signed)
  Request for patient to stop medication prior to procedure or is needing cleareance  11/17/22  Brett Matthews 03/08/1932  What type of surgery is being performed? Colonoscopy with possible Esophagogastroduodenoscopy (EGD)  When is surgery scheduled? TBD  What type of clearance is required (medical or pharmacy to hold medication or both? medication  Are there any medications that need to be held prior to surgery and how long? Xarelto x 2 days  Name of physician performing surgery?  Dr.Rourk [facility] [employer]: [phone] Fax: [phone]  Anethesia type (none, local, MAC, general)? MAC

## 2022-12-10 NOTE — Telephone Encounter (Signed)
Clearance to hold xarelto scanned under media tab.

## 2022-12-11 DIAGNOSIS — I10 Essential (primary) hypertension: Secondary | ICD-10-CM | POA: Diagnosis not present

## 2022-12-11 DIAGNOSIS — Z299 Encounter for prophylactic measures, unspecified: Secondary | ICD-10-CM | POA: Diagnosis not present

## 2022-12-11 DIAGNOSIS — H6123 Impacted cerumen, bilateral: Secondary | ICD-10-CM | POA: Diagnosis not present

## 2022-12-23 NOTE — Telephone Encounter (Signed)
Ok to go ahead with scheduling pt for his procedures?

## 2022-12-24 ENCOUNTER — Other Ambulatory Visit: Payer: Self-pay | Admitting: *Deleted

## 2022-12-24 ENCOUNTER — Encounter: Payer: Self-pay | Admitting: *Deleted

## 2022-12-24 DIAGNOSIS — Z515 Encounter for palliative care: Secondary | ICD-10-CM | POA: Diagnosis not present

## 2022-12-24 DIAGNOSIS — Z7901 Long term (current) use of anticoagulants: Secondary | ICD-10-CM | POA: Diagnosis not present

## 2022-12-24 DIAGNOSIS — I4891 Unspecified atrial fibrillation: Secondary | ICD-10-CM | POA: Diagnosis not present

## 2022-12-24 MED ORDER — PEG 3350-KCL-NA BICARB-NACL 420 G PO SOLR: 4000.0000 mL | Freq: Once | ORAL | 0 refills | Status: AC

## 2022-12-24 NOTE — Telephone Encounter (Signed)
Pt has been scheduled for 02/11/23. Instructions mailed and prep sent to the pharmacy

## 2022-12-24 NOTE — Telephone Encounter (Signed)
Called pt to schedule him for procedures. He states that no one informed him of being scheduled for these procedures. He was advised that he spoke with Rosezella Kronick C on 11/14/22 and agreed to have the procedures. He says he didn't talk to anyone. Pt also said he had surgery that "fixed it so  wouldn't be able to vomit anymore" because of his reflux. He wants to know if he would have to do the EGD.  Please advise. Thank you

## 2022-12-25 ENCOUNTER — Encounter: Payer: Self-pay | Admitting: *Deleted

## 2022-12-25 DIAGNOSIS — E1142 Type 2 diabetes mellitus with diabetic polyneuropathy: Secondary | ICD-10-CM | POA: Diagnosis not present

## 2022-12-25 DIAGNOSIS — M79676 Pain in unspecified toe(s): Secondary | ICD-10-CM | POA: Diagnosis not present

## 2022-12-25 DIAGNOSIS — B351 Tinea unguium: Secondary | ICD-10-CM | POA: Diagnosis not present

## 2022-12-25 DIAGNOSIS — L84 Corns and callosities: Secondary | ICD-10-CM | POA: Diagnosis not present

## 2023-01-26 DIAGNOSIS — E1142 Type 2 diabetes mellitus with diabetic polyneuropathy: Secondary | ICD-10-CM | POA: Diagnosis not present

## 2023-01-26 DIAGNOSIS — E1165 Type 2 diabetes mellitus with hyperglycemia: Secondary | ICD-10-CM | POA: Diagnosis not present

## 2023-01-26 DIAGNOSIS — Z794 Long term (current) use of insulin: Secondary | ICD-10-CM | POA: Diagnosis not present

## 2023-01-26 DIAGNOSIS — I10 Essential (primary) hypertension: Secondary | ICD-10-CM | POA: Diagnosis not present

## 2023-01-26 DIAGNOSIS — Z299 Encounter for prophylactic measures, unspecified: Secondary | ICD-10-CM | POA: Diagnosis not present

## 2023-02-13 NOTE — Patient Instructions (Addendum)
 Your procedure is scheduled on: 02/18/2023  Report to [facility] Main Entrance at   8:45  AM.  Call this number if you have problems the morning of surgery: [phone]   Remember: HOLD Xarelto  for 2 days prior to procedure  last dose on 02/15/2023              Follow Directions on the letter you received from Your Physician's office regarding the Bowel Prep You may have CLEAR liquids until 6:45 am of procedure  Water , tea, Black coffee NO CREAM              No Smoking the day of Procedure :   Take these medicines the morning of surgery with A SIP OF WATER : Carvedilol , Celexa , Diltiazem , levothyroxine , gabapentin , hydralazine , pantoprazole , and flomax   No diabetic medication am of procedure   Do not wear jewelry, make-up or nail polish.    Do not bring valuables to the hospital.  Contacts, dentures or bridgework may not be worn into surgery.  .   Patients discharged the day of surgery will not be allowed to drive home.     Colonoscopy, Adult, Care After This sheet gives you information about how to care for yourself after your procedure. Your health care provider may also give you more specific instructions. If you have problems or questions, contact your health care provider. What can I expect after the procedure? After the procedure, it is common to have: A small amount of blood in your stool for 24 hours after the procedure. Some gas. Mild abdominal cramping or bloating.  Follow these instructions at home: General instructions  For the first 24 hours after the procedure: Do not drive or use machinery. Do not sign important documents. Do not drink alcohol . Do your regular daily activities at a slower pace than normal. Eat soft, easy-to-digest foods. Rest often. Take over-the-counter or prescription medicines only as told by your health care provider. It is up to you to get the results of your procedure. Ask your health care provider, or the department performing the  procedure, when your results will be ready. Relieving cramping and bloating Try walking around when you have cramps or feel bloated. Apply heat to your abdomen as told by your health care provider. Use a heat source that your health care provider recommends, such as a moist heat pack or a heating pad. Place a towel between your skin and the heat source. Leave the heat on for 20-30 minutes. Remove the heat if your skin turns bright red. This is especially important if you are unable to feel pain, heat, or cold. You may have a greater risk of getting burned. Eating and drinking Drink enough fluid to keep your urine clear or pale yellow. Resume your normal diet as instructed by your health care provider. Avoid heavy or fried foods that are hard to digest. Avoid drinking alcohol  for as long as instructed by your health care provider. Contact a health care provider if: You have blood in your stool 2-3 days after the procedure. Get help right away if: You have more than a small spotting of blood in your stool. You pass large blood clots in your stool. Your abdomen is swollen. You have nausea or vomiting. You have a fever. You have increasing abdominal pain that is not relieved with medicine. This information is not intended to replace advice given to you by your health care provider. Make sure you discuss any questions you have with your health  care provider. Document Released: 08/07/2003 Document Revised: 09/17/2015 Document Reviewed: 03/06/2015 Elsevier Interactive Patient Education  2018 [employer] Upper Endoscopy, Adult, Care After After the procedure, it is common to have a sore throat. It is also common to have: Mild stomach pain or discomfort. Bloating. Nausea. Follow these instructions at home: The instructions below may help you care for yourself at home. Your health care provider may give you more instructions. If you have questions, ask your health care provider. If you were  given a sedative during the procedure, it can affect you for several hours. Do not drive or operate machinery until your health care provider says that it is safe. If you will be going home right after the procedure, plan to have a responsible adult: Take you home from the hospital or clinic. You will not be allowed to drive. Care for you for the time you are told. Follow instructions from your health care provider about what you may eat and drink. Return to your normal activities as told by your health care provider. Ask your health care provider what activities are safe for you. Take over-the-counter and prescription medicines only as told by your health care provider. Contact a health care provider if you: Have a sore throat that lasts longer than one day. Have trouble swallowing. Have a fever. Get help right away if you: Vomit blood or your vomit looks like coffee grounds. Have bloody, black, or tarry stools. Have a very bad sore throat or you cannot swallow. Have difficulty breathing or very bad pain in your chest or abdomen. These symptoms may be an emergency. Get help right away. Call 911. Do not wait to see if the symptoms will go away. Do not drive yourself to the hospital. Summary After the procedure, it is common to have a sore throat, mild stomach discomfort, bloating, and nausea. If you were given a sedative during the procedure, it can affect you for several hours. Do not drive until your health care provider says that it is safe. Follow instructions from your health care provider about what you may eat and drink. Return to your normal activities as told by your health care provider. This information is not intended to replace advice given to you by your health care provider. Make sure you discuss any questions you have with your health care provider. Document Revised: 04/03/2021 Document Reviewed: 04/03/2021 Elsevier Patient Education  2024 [employer].

## 2023-02-14 DIAGNOSIS — Z79890 Hormone replacement therapy (postmenopausal): Secondary | ICD-10-CM | POA: Diagnosis not present

## 2023-02-14 DIAGNOSIS — Z20822 Contact with and (suspected) exposure to covid-19: Secondary | ICD-10-CM | POA: Diagnosis not present

## 2023-02-14 DIAGNOSIS — I2510 Atherosclerotic heart disease of native coronary artery without angina pectoris: Secondary | ICD-10-CM | POA: Diagnosis not present

## 2023-02-14 DIAGNOSIS — J101 Influenza due to other identified influenza virus with other respiratory manifestations: Secondary | ICD-10-CM | POA: Diagnosis not present

## 2023-02-14 DIAGNOSIS — Z885 Allergy status to narcotic agent status: Secondary | ICD-10-CM | POA: Diagnosis not present

## 2023-02-14 DIAGNOSIS — E119 Type 2 diabetes mellitus without complications: Secondary | ICD-10-CM | POA: Diagnosis not present

## 2023-02-14 DIAGNOSIS — R059 Cough, unspecified: Secondary | ICD-10-CM | POA: Diagnosis not present

## 2023-02-14 DIAGNOSIS — J208 Acute bronchitis due to other specified organisms: Secondary | ICD-10-CM | POA: Diagnosis not present

## 2023-02-14 DIAGNOSIS — R918 Other nonspecific abnormal finding of lung field: Secondary | ICD-10-CM | POA: Diagnosis not present

## 2023-02-14 DIAGNOSIS — E785 Hyperlipidemia, unspecified: Secondary | ICD-10-CM | POA: Diagnosis not present

## 2023-02-14 DIAGNOSIS — I517 Cardiomegaly: Secondary | ICD-10-CM | POA: Diagnosis not present

## 2023-02-14 DIAGNOSIS — Z7984 Long term (current) use of oral hypoglycemic drugs: Secondary | ICD-10-CM | POA: Diagnosis not present

## 2023-02-14 DIAGNOSIS — B9689 Other specified bacterial agents as the cause of diseases classified elsewhere: Secondary | ICD-10-CM | POA: Diagnosis not present

## 2023-02-14 DIAGNOSIS — I10 Essential (primary) hypertension: Secondary | ICD-10-CM | POA: Diagnosis not present

## 2023-02-14 DIAGNOSIS — Z87891 Personal history of nicotine dependence: Secondary | ICD-10-CM | POA: Diagnosis not present

## 2023-02-16 ENCOUNTER — Encounter (HOSPITAL_COMMUNITY)
Admission: RE | Admit: 2023-02-16 | Discharge: 2023-02-16 | Disposition: A | Discharge status: Home / Self Care | Payer: PPO | Source: Ambulatory Visit | Attending: Internal Medicine | Admitting: Internal Medicine

## 2023-02-16 ENCOUNTER — Encounter (HOSPITAL_COMMUNITY): Payer: Self-pay

## 2023-02-16 ENCOUNTER — Telehealth: Payer: Self-pay | Admitting: *Deleted

## 2023-02-16 DIAGNOSIS — E119 Type 2 diabetes mellitus without complications: Secondary | ICD-10-CM

## 2023-02-16 DIAGNOSIS — K625 Hemorrhage of anus and rectum: Secondary | ICD-10-CM

## 2023-02-16 NOTE — Telephone Encounter (Signed)
 Pt spouse called in stating pt is very sick and needs to cancel procedure for 2/12. Will call back to reschedule. Message sent to endo also

## 2023-02-18 ENCOUNTER — Ambulatory Visit (HOSPITAL_COMMUNITY): Admission: RE | Admit: 2023-02-18 | Payer: PPO | Source: Home / Self Care | Admitting: Internal Medicine

## 2023-02-18 ENCOUNTER — Encounter (HOSPITAL_COMMUNITY): Admission: RE | Payer: Self-pay | Source: Home / Self Care

## 2023-02-18 SURGERY — COLONOSCOPY WITH PROPOFOL
Anesthesia: Choice

## 2023-02-19 ENCOUNTER — Other Ambulatory Visit: Payer: Self-pay | Admitting: Cardiology

## 2023-02-19 DIAGNOSIS — E1140 Type 2 diabetes mellitus with diabetic neuropathy, unspecified: Secondary | ICD-10-CM | POA: Diagnosis not present

## 2023-02-19 DIAGNOSIS — Z299 Encounter for prophylactic measures, unspecified: Secondary | ICD-10-CM | POA: Diagnosis not present

## 2023-02-19 DIAGNOSIS — J209 Acute bronchitis, unspecified: Secondary | ICD-10-CM | POA: Diagnosis not present

## 2023-02-19 DIAGNOSIS — I4891 Unspecified atrial fibrillation: Secondary | ICD-10-CM | POA: Diagnosis not present

## 2023-02-19 DIAGNOSIS — I10 Essential (primary) hypertension: Secondary | ICD-10-CM

## 2023-03-05 ENCOUNTER — Telehealth: Payer: Self-pay | Admitting: Cardiology

## 2023-03-05 ENCOUNTER — Ambulatory Visit: Payer: PPO | Admitting: Cardiology

## 2023-03-05 NOTE — Telephone Encounter (Signed)
 Pt significant other called in stating "I'm on the up in the elevator". I informed her pt appt was not until Monday. She states she is still coming upstairs and disconnected call. - CRM

## 2023-03-09 ENCOUNTER — Ambulatory Visit: Payer: PPO | Attending: Cardiology | Admitting: Cardiology

## 2023-03-09 ENCOUNTER — Encounter: Payer: Self-pay | Admitting: Cardiology

## 2023-03-09 VITALS — BP 132/68 | HR 78 | Resp 16 | Ht 68.0 in | Wt 193.8 lb

## 2023-03-09 DIAGNOSIS — I480 Paroxysmal atrial fibrillation: Secondary | ICD-10-CM

## 2023-03-09 DIAGNOSIS — D500 Iron deficiency anemia secondary to blood loss (chronic): Secondary | ICD-10-CM | POA: Diagnosis not present

## 2023-03-09 DIAGNOSIS — I10 Essential (primary) hypertension: Secondary | ICD-10-CM

## 2023-03-09 NOTE — Progress Notes (Signed)
 Cardiology Office Note:  .   Date:  03/09/2023  ID:  Brett Matthews, DOB 1932/05/12, MRN [id number] PCP: Ignatius Specking, MD  [facility] Providers Cardiologist:  Yates Decamp, MD   History of Present Illness: .   Brett Matthews is a 89 y.o.  Caucasian male with paroxysmal atrial fibrillation, hypertension, hyperlipidemia, controlled diabetes mellitus, moderate coronary artery disease in mid circumflex with 60% stenosis by angiography in 2013.  This is annual visit. Discussed the use of AI scribe software for clinical note transcription with the patient, who gave verbal consent to proceed.  History of Present Illness   The patient, with a history of atrial fibrillation (Afib) and on blood thinner Xarelto, presents with a low blood count and potential internal bleeding. He has been feeling generally unwell, having contracted COVID-19 twice, which he describes as having "put me out of business." He has been seen by a GI doctor and was scheduled for a colonoscopy to investigate potential sources of bleeding, but this was postponed due to illness. He also reports some "stuff" on his back, which he wonders if he should have removed, but the doctor advises to leave it alone. The patient also takes a pain pill for back pain, but does not take any other over-the-counter pain relievers such as Motrin, Aleve, ibuprofen, or aspirin.      Labs   External Labs:  Care Everywhere labs 06/20/2022:  Sodium 140, potassium 3.8, BUN 12, creatinine 0.87, EGFR 83 mL.  Hb 8.5/HCT 27.7, platelets 369.  Microcytic indicis.  [facility] labs 11/10/2022:  Hb 8.8, platelets 300 49K.  Labs 10/27/2022:  Serum creatinine 0.980, potassium 3.8, EGFR 79 mL.  Review of Systems  Cardiovascular:  Negative for chest pain, dyspnea on exertion and leg swelling.   Physical Exam:   VS:  BP 132/68 (BP Location: Left Arm, Patient Position: Sitting, Cuff Size: Normal)   Pulse 78   Resp 16   Ht 5\' 8"  (1.727 m)   Wt 193 lb 12.8  oz (87.9 kg)   SpO2 96%   BMI 29.47 kg/m    Wt Readings from Last 3 Encounters:  03/09/23 193 lb 12.8 oz (87.9 kg)  11/10/22 203 lb 3.2 oz (92.2 kg)  03/04/22 208 lb (94.3 kg)    Physical Exam Neck:     Vascular: No carotid bruit or JVD.  Cardiovascular:     Rate and Rhythm: Normal rate. Rhythm irregular.     Pulses: Normal pulses and intact distal pulses.     Heart sounds: No murmur heard. Pulmonary:     Effort: Pulmonary effort is normal.     Breath sounds: Examination of the left-lower field reveals rales. Rales present.  Abdominal:     General: Bowel sounds are normal.     Palpations: Abdomen is soft.  Musculoskeletal:     Right lower leg: No edema.     Left lower leg: No edema.  Skin:    Capillary Refill: Capillary refill takes less than 2 seconds.    Studies Reviewed: Marland Kitchen     EKG:    EKG Interpretation Date/Time:  Monday March 09 2023 13:36:18 EST Ventricular Rate:  86 PR Interval:    QRS Duration:  144 QT Interval:  414 QTC Calculation: 495 R Axis:   -72  Text Interpretation: EKG 03/09/2023: Coarse atrial fibrillation with controlled ventricular response at the rate of 86 bpm, left anterior fascicular block.  Right bundle branch block.  Nonspecific T abnormality.  Compared to  03/04/2022 and 08/03/2021, sinus rhythm has been replaced. Confirmed by Delrae Rend [phone]) on 03/09/2023 1:41:22 PM    EKG 03/04/2022: Normal sinus rhythm at the rate of 61 bpm, left axis deviation, left anterior fascicular block. Right bundle branch block. No evidence of ischemia.   Medications and allergies    No Known Allergies   Current Outpatient Medications:    acetaminophen (TYLENOL) 500 MG tablet, Take 2 tablets (1,000 mg total) by mouth every 6 (six) hours as needed for mild pain or moderate pain., Disp: 60 tablet, Rfl: 0   atorvastatin (LIPITOR) 10 MG tablet, Take 1 tablet (10 mg total) by mouth daily., Disp: 90 tablet, Rfl: 2   benazepril (LOTENSIN) 20 MG tablet, Take 20 mg  by mouth 2 (two) times daily., Disp: , Rfl:    carvedilol (COREG) 25 MG tablet, Take 1 tablet (25 mg total) by mouth 2 (two) times daily with a meal., Disp: 60 tablet, Rfl: 0   cholecalciferol (VITAMIN D3) 25 MCG (1000 UNIT) tablet, Take 1,000 Units by mouth daily., Disp: , Rfl:    citalopram (CELEXA) 40 MG tablet, Take 40 mg by mouth daily., Disp: , Rfl:    diltiazem (CARDIZEM CD) 240 MG 24 hr capsule, Take 240 mg by mouth daily., Disp: , Rfl:    famotidine (PEPCID) 40 MG tablet, Take 40 mg by mouth daily., Disp: , Rfl:    folic acid (FOLVITE) 1 MG tablet, Take 1 mg by mouth daily., Disp: , Rfl:    gabapentin (NEURONTIN) 300 MG capsule, Take 300 mg by mouth 3 (three) times daily., Disp: , Rfl:    glimepiride (AMARYL) 2 MG tablet, Take 2 mg by mouth 2 (two) times daily., Disp: , Rfl: 9   hydrALAZINE (APRESOLINE) 50 MG tablet, TAKE 1 TABLET BY MOUTH THREE TIMES DAILY, Disp: 270 tablet, Rfl: 0   Insulin Degludec (TRESIBA) 100 UNIT/ML SOLN, Inject 14 Units into the skin daily., Disp: , Rfl:    levocetirizine (XYZAL) 5 MG tablet, Take 5 mg by mouth at bedtime., Disp: , Rfl:    levothyroxine (SYNTHROID) 125 MCG tablet, Take 125 mcg by mouth daily at 2 am., Disp: , Rfl:    LINZESS 290 MCG CAPS capsule, Take 290 mcg by mouth daily., Disp: , Rfl:    Melatonin 10 MG TABS, Take 10 mg by mouth at bedtime., Disp: , Rfl:    metFORMIN (GLUCOPHAGE) 500 MG tablet, Take 500-1,000 mg by mouth See admin instructions. Take 2 tabs (1,000mg ) by mouth every morning & 1 tab (500mg ) every evening, Disp: , Rfl:    Multiple Vitamins-Minerals (ONE DAILY MULTIVITAMIN MEN) TABS, Take by mouth., Disp: , Rfl:    Multiple Vitamins-Minerals (PRESERVISION AREDS PO), Take by mouth., Disp: , Rfl:    mupirocin ointment (BACTROBAN) 2 %, 1 Application 2 (two) times daily., Disp: , Rfl:    pantoprazole (PROTONIX) 40 MG tablet, Take 40 mg by mouth daily at 12 noon., Disp: , Rfl:    rivaroxaban (XARELTO) 20 MG TABS tablet, Take 1 tablet  (20 mg total) by mouth daily with supper., Disp: 30 tablet, Rfl: 0   tamsulosin (FLOMAX) 0.4 MG CAPS capsule, Take 0.4 mg by mouth daily after breakfast., Disp: , Rfl:    ASSESSMENT AND PLAN: .      ICD-10-CM   1. Paroxysmal atrial fibrillation (HCC)  I48.0 EKG 12-Lead    2. Essential hypertension, benign  I10     3. Chronic blood loss anemia  D50.0  Click Here to Calculate/Change CHADS2VASc Score The patient's CHADS2-VASc score is 5, indicating a 7.2% annual risk of stroke.  Therefore, anticoagulation is recommended.   CHF History: No HTN History: Yes Diabetes History: Yes Stroke History: No Vascular Disease History: Yes    Assessment and Plan    Atrial Fibrillation   Chronic atrial fibrillation presents with intermittent episodes but is currently asymptomatic. He continues on Xarelto due to high stroke risk. The importance of maintaining anticoagulation despite potential bleeding risks was discussed. If significant gastrointestinal bleeding is confirmed, consider alternative treatments such as Watchman device placement. Continue Xarelto and monitor for bleeding complications. Patient is aware not to use NSAIDs, aspirin, Goody powder etc. that could potentially worsen GI bleed risk.  Patient's daughter is present.  Hypertension: Patient blood pressure is well-controlled on Lotensin 20 mg daily and diltiazem to 40 mg daily along with hydralazine 50 mg 3 times a day.  Continue the same for now.  Otherwise he is presently doing well, no clinical evidence of heart failure, I will see him back in 6 months for follow-up.  Anemia   Chronic anemia is likely secondary to gastrointestinal bleeding, exacerbated by Xarelto, with hemoglobin at 8.8. Iron deficiency is suspected due to probable gastrointestinal bleeding. A previous GI evaluation was incomplete due to illness, necessitating rescheduling of the colonoscopy to identify the bleeding source. If confirmed, iron transfusions may be  necessary. Patient has follow-up appointment with Dr. Jena Gauss from GI standpoint for follow-up and evaluation.  General Health Maintenance   Routine health maintenance was discussed. No recent comprehensive blood work since October 2024. Request copies of all future lab results from the primary care provider.  Follow-up in 6 months.      Signed,  Yates Decamp, MD, [facility] 03/09/2023, 2:04 PM [facility] [address] [place], [place] [address] Phone: [phone]. Fax:  [phone]

## 2023-03-09 NOTE — Patient Instructions (Signed)
 Medication Instructions:  Your physician recommends that you continue on your current medications as directed. Please refer to the Current Medication list given to you today.  *If you need a refill on your cardiac medications before your next appointment, please call your pharmacy*   Lab Work: none If you have labs (blood work) drawn today and your tests are completely normal, you will receive your results only by: MyChart Message (if you have MyChart) OR A paper copy in the mail If you have any lab test that is abnormal or we need to change your treatment, we will call you to review the results.   Testing/Procedures: none   Follow-Up: At [facility], you and your health needs are our priority.  As part of our continuing mission to provide you with exceptional heart care, we have created designated Provider Care Teams.  These Care Teams include your primary Cardiologist (physician) and Advanced Practice Providers (APPs -  Physician Assistants and Nurse Practitioners) who all work together to provide you with the care you need, when you need it.  We recommend signing up for the patient portal called "MyChart".  Sign up information is provided on this After Visit Summary.  MyChart is used to connect with patients for Virtual Visits (Telemedicine).  Patients are able to view lab/test results, encounter notes, upcoming appointments, etc.  Non-urgent messages can be sent to your provider as well.   To learn more about what you can do with MyChart, go to [email or web address].    Your next appointment:   6 month(s)  Provider:   Yates Decamp, MD     Other Instructions

## 2023-03-10 ENCOUNTER — Telehealth: Payer: Self-pay | Admitting: *Deleted

## 2023-03-10 MED ORDER — PEG 3350-KCL-NA BICARB-NACL 420 G PO SOLR: 4000.0000 mL | Freq: Once | ORAL | 0 refills | Status: AC

## 2023-03-10 NOTE — Telephone Encounter (Signed)
 Spoke with Olegario Messier (on dpr) and patient has rescheduled procedure to 4/25. Aware will send new instructions and call back with pre-op appt. Unsure if they have prep so asked for a new one sent in. I have done so.

## 2023-03-11 ENCOUNTER — Encounter: Payer: Self-pay | Admitting: *Deleted

## 2023-03-12 DIAGNOSIS — B351 Tinea unguium: Secondary | ICD-10-CM | POA: Diagnosis not present

## 2023-03-12 DIAGNOSIS — M79676 Pain in unspecified toe(s): Secondary | ICD-10-CM | POA: Diagnosis not present

## 2023-03-12 DIAGNOSIS — E1142 Type 2 diabetes mellitus with diabetic polyneuropathy: Secondary | ICD-10-CM | POA: Diagnosis not present

## 2023-03-12 DIAGNOSIS — L84 Corns and callosities: Secondary | ICD-10-CM | POA: Diagnosis not present

## 2023-03-26 ENCOUNTER — Other Ambulatory Visit: Payer: Self-pay

## 2023-03-31 DIAGNOSIS — R0602 Shortness of breath: Secondary | ICD-10-CM | POA: Diagnosis not present

## 2023-03-31 DIAGNOSIS — Z299 Encounter for prophylactic measures, unspecified: Secondary | ICD-10-CM | POA: Diagnosis not present

## 2023-03-31 DIAGNOSIS — I10 Essential (primary) hypertension: Secondary | ICD-10-CM | POA: Diagnosis not present

## 2023-03-31 DIAGNOSIS — R0600 Dyspnea, unspecified: Secondary | ICD-10-CM | POA: Diagnosis not present

## 2023-03-31 DIAGNOSIS — R918 Other nonspecific abnormal finding of lung field: Secondary | ICD-10-CM | POA: Diagnosis not present

## 2023-03-31 DIAGNOSIS — R6883 Chills (without fever): Secondary | ICD-10-CM | POA: Diagnosis not present

## 2023-04-03 DIAGNOSIS — R9389 Abnormal findings on diagnostic imaging of other specified body structures: Secondary | ICD-10-CM | POA: Diagnosis not present

## 2023-04-03 DIAGNOSIS — R911 Solitary pulmonary nodule: Secondary | ICD-10-CM | POA: Diagnosis not present

## 2023-04-03 DIAGNOSIS — I517 Cardiomegaly: Secondary | ICD-10-CM | POA: Diagnosis not present

## 2023-04-03 DIAGNOSIS — R918 Other nonspecific abnormal finding of lung field: Secondary | ICD-10-CM | POA: Diagnosis not present

## 2023-04-03 DIAGNOSIS — J90 Pleural effusion, not elsewhere classified: Secondary | ICD-10-CM | POA: Diagnosis not present

## 2023-04-15 DIAGNOSIS — L739 Follicular disorder, unspecified: Secondary | ICD-10-CM | POA: Diagnosis not present

## 2023-04-15 DIAGNOSIS — E1140 Type 2 diabetes mellitus with diabetic neuropathy, unspecified: Secondary | ICD-10-CM | POA: Diagnosis not present

## 2023-04-15 DIAGNOSIS — I700 Atherosclerosis of aorta: Secondary | ICD-10-CM | POA: Diagnosis not present

## 2023-04-15 DIAGNOSIS — I10 Essential (primary) hypertension: Secondary | ICD-10-CM | POA: Diagnosis not present

## 2023-04-15 DIAGNOSIS — Z299 Encounter for prophylactic measures, unspecified: Secondary | ICD-10-CM | POA: Diagnosis not present

## 2023-04-15 DIAGNOSIS — I5032 Chronic diastolic (congestive) heart failure: Secondary | ICD-10-CM | POA: Diagnosis not present

## 2023-04-25 DIAGNOSIS — E663 Overweight: Secondary | ICD-10-CM | POA: Diagnosis not present

## 2023-04-25 DIAGNOSIS — Z6828 Body mass index (BMI) 28.0-28.9, adult: Secondary | ICD-10-CM | POA: Diagnosis not present

## 2023-04-25 DIAGNOSIS — J069 Acute upper respiratory infection, unspecified: Secondary | ICD-10-CM | POA: Diagnosis not present

## 2023-04-25 DIAGNOSIS — I10 Essential (primary) hypertension: Secondary | ICD-10-CM | POA: Diagnosis not present

## 2023-04-27 DIAGNOSIS — D692 Other nonthrombocytopenic purpura: Secondary | ICD-10-CM | POA: Diagnosis not present

## 2023-04-27 DIAGNOSIS — I700 Atherosclerosis of aorta: Secondary | ICD-10-CM | POA: Diagnosis not present

## 2023-04-27 DIAGNOSIS — J069 Acute upper respiratory infection, unspecified: Secondary | ICD-10-CM | POA: Diagnosis not present

## 2023-04-27 DIAGNOSIS — I739 Peripheral vascular disease, unspecified: Secondary | ICD-10-CM | POA: Diagnosis not present

## 2023-04-27 DIAGNOSIS — Z299 Encounter for prophylactic measures, unspecified: Secondary | ICD-10-CM | POA: Diagnosis not present

## 2023-04-27 DIAGNOSIS — I4891 Unspecified atrial fibrillation: Secondary | ICD-10-CM | POA: Diagnosis not present

## 2023-04-28 NOTE — Patient Instructions (Signed)
 Brett Matthews  04/28/2023     @PREFPERIOPPHARMACY @   Your procedure is scheduled on  05/01/2023.   Report to [facility] at  0600 A.M.   Call this number if you have problems the morning of surgery:  [phone]  If you experience any cold or flu symptoms such as cough, fever, chills, shortness of breath, etc. between now and your scheduled surgery, please notify us  at the above number.   Remember:         Your last dose of xarelto  soul be on 04/28/2023.        Take 1/2 of your usual night time insulin  dosage the night before your procedure.        DO NOT take any medications for diabetes the morning of your procedure.   Follow the diet and prep instructions given to you by the office.   You may drink clear liquids until  0330 am on 05/01/2023.    Clear liquids allowed are:                    Water , Juice (No red color; non-citric and without pulp; diabetics please choose diet or no sugar options), Carbonated beverages (diabetics please choose diet or no sugar options), Clear Tea (No creamer, milk, or cream, including half & half and powdered creamer), Black Coffee Only (No creamer, milk or cream, including half & half and powdered creamer), and Clear Sports drink (No red color; diabetics please choose diet or no sugar options)    Take these medicines the morning of surgery with A SIP OF WATER                    carvedilol , citalopram , diltiazem , pepcid , gabapentin , levothyroxine , pantoprazole , tamsulosin .     Do not wear jewelry, make-up or nail polish, including gel polish,  artificial nails, or any other type of covering on natural nails (fingers and  toes).  Do not wear lotions, powders, or perfumes, or deodorant.  Do not shave 48 hours prior to surgery.  Men may shave face and neck.  Do not bring valuables to the hospital.  [facility] is not responsible for any belongings or valuables.  Contacts, dentures or bridgework may not be worn into surgery.  Leave  your suitcase in the car.  After surgery it may be brought to your room.  For patients admitted to the hospital, discharge time will be determined by your treatment team.  Patients discharged the day of surgery will not be allowed to drive home and must have someone with them for 24 hours.    Special instructions:   DO NOT smoke tobacco or vape for 24 hours before your procedure.  Please read over the following fact sheets that you were given. Anesthesia Post-op Instructions and Care and Recovery After Surgery      Upper Endoscopy, Adult, Care After After the procedure, it is common to have a sore throat. It is also common to have: Mild stomach pain or discomfort. Bloating. Nausea. Follow these instructions at home: The instructions below may help you care for yourself at home. Your health care provider may give you more instructions. If you have questions, ask your health care provider. If you were given a sedative during the procedure, it can affect you for several hours. Do not drive or operate machinery until your health care provider says that it is safe. If you will be going home right after the  procedure, plan to have a responsible adult: Take you home from the hospital or clinic. You will not be allowed to drive. Care for you for the time you are told. Follow instructions from your health care provider about what you may eat and drink. Return to your normal activities as told by your health care provider. Ask your health care provider what activities are safe for you. Take over-the-counter and prescription medicines only as told by your health care provider. Contact a health care provider if you: Have a sore throat that lasts longer than one day. Have trouble swallowing. Have a fever. Get help right away if you: Vomit blood or your vomit looks like coffee grounds. Have bloody, black, or tarry stools. Have a very bad sore throat or you cannot swallow. Have difficulty  breathing or very bad pain in your chest or abdomen. These symptoms may be an emergency. Get help right away. Call 911. Do not wait to see if the symptoms will go away. Do not drive yourself to the hospital. Summary After the procedure, it is common to have a sore throat, mild stomach discomfort, bloating, and nausea. If you were given a sedative during the procedure, it can affect you for several hours. Do not drive until your health care provider says that it is safe. Follow instructions from your health care provider about what you may eat and drink. Return to your normal activities as told by your health care provider. This information is not intended to replace advice given to you by your health care provider. Make sure you discuss any questions you have with your health care provider. Document Revised: 04/03/2021 Document Reviewed: 04/03/2021 Elsevier Patient Education  2024 [employer]Colonoscopy, Adult, Care After The following information offers guidance on how to care for yourself after your procedure. Your health care provider may also give you more specific instructions. If you have problems or questions, contact your health care provider. What can I expect after the procedure? After the procedure, it is common to have: A small amount of blood in your stool for 24 hours after the procedure. Some gas. Mild cramping or bloating of your abdomen. Follow these instructions at home: Eating and drinking  Drink enough fluid to keep your urine pale yellow. Follow instructions from your health care provider about eating or drinking restrictions. Resume your normal diet as told by your health care provider. Avoid heavy or fried foods that are hard to digest. Activity Rest as told by your health care provider. Avoid sitting for a long time without moving. Get up to take short walks every 1-2 hours. This is important to improve blood flow and breathing. Ask for help if you feel weak or  unsteady. Return to your normal activities as told by your health care provider. Ask your health care provider what activities are safe for you. Managing cramping and bloating  Try walking around when you have cramps or feel bloated. If directed, apply heat to your abdomen as told by your health care provider. Use the heat source that your health care provider recommends, such as a moist heat pack or a heating pad. Place a towel between your skin and the heat source. Leave the heat on for 20-30 minutes. Remove the heat if your skin turns bright red. This is especially important if you are unable to feel pain, heat, or cold. You have a greater risk of getting burned. General instructions If you were given a sedative during the procedure, it can affect  you for several hours. Do not drive or operate machinery until your health care provider says that it is safe. For the first 24 hours after the procedure: Do not sign important documents. Do not drink alcohol . Do your regular daily activities at a slower pace than normal. Eat soft foods that are easy to digest. Take over-the-counter and prescription medicines only as told by your health care provider. Keep all follow-up visits. This is important. Contact a health care provider if: You have blood in your stool 2-3 days after the procedure. Get help right away if: You have more than a small spotting of blood in your stool. You have large blood clots in your stool. You have swelling of your abdomen. You have nausea or vomiting. You have a fever. You have increasing pain in your abdomen that is not relieved with medicine. These symptoms may be an emergency. Get help right away. Call 911. Do not wait to see if the symptoms will go away. Do not drive yourself to the hospital. Summary After the procedure, it is common to have a small amount of blood in your stool. You may also have mild cramping and bloating of your abdomen. If you were given a  sedative during the procedure, it can affect you for several hours. Do not drive or operate machinery until your health care provider says that it is safe. Get help right away if you have a lot of blood in your stool, nausea or vomiting, a fever, or increased pain in your abdomen. This information is not intended to replace advice given to you by your health care provider. Make sure you discuss any questions you have with your health care provider. Document Revised: 02/04/2022 Document Reviewed: 08/15/2020 Elsevier Patient Education  2024 [employer].General Anesthesia, Adult, Care After The following information offers guidance on how to care for yourself after your procedure. Your health care provider may also give you more specific instructions. If you have problems or questions, contact your health care provider. What can I expect after the procedure? After the procedure, it is common for people to: Have pain or discomfort at the IV site. Have nausea or vomiting. Have a sore throat or hoarseness. Have trouble concentrating. Feel cold or chills. Feel weak, sleepy, or tired (fatigue). Have soreness and body aches. These can affect parts of the body that were not involved in surgery. Follow these instructions at home: For the time period you were told by your health care provider:  Rest. Do not participate in activities where you could fall or become injured. Do not drive or use machinery. Do not drink alcohol . Do not take sleeping pills or medicines that cause drowsiness. Do not make important decisions or sign legal documents. Do not take care of children on your own. General instructions Drink enough fluid to keep your urine pale yellow. If you have sleep apnea, surgery and certain medicines can increase your risk for breathing problems. Follow instructions from your health care provider about wearing your sleep device: Anytime you are sleeping, including during daytime naps. While  taking prescription pain medicines, sleeping medicines, or medicines that make you drowsy. Return to your normal activities as told by your health care provider. Ask your health care provider what activities are safe for you. Take over-the-counter and prescription medicines only as told by your health care provider. Do not use any products that contain nicotine or tobacco. These products include cigarettes, chewing tobacco, and vaping devices, such as e-cigarettes. These can delay  incision healing after surgery. If you need help quitting, ask your health care provider. Contact a health care provider if: You have nausea or vomiting that does not get better with medicine. You vomit every time you eat or drink. You have pain that does not get better with medicine. You cannot urinate or have bloody urine. You develop a skin rash. You have a fever. Get help right away if: You have trouble breathing. You have chest pain. You vomit blood. These symptoms may be an emergency. Get help right away. Call 911. Do not wait to see if the symptoms will go away. Do not drive yourself to the hospital. Summary After the procedure, it is common to have a sore throat, hoarseness, nausea, vomiting, or to feel weak, sleepy, or fatigue. For the time period you were told by your health care provider, do not drive or use machinery. Get help right away if you have difficulty breathing, have chest pain, or vomit blood. These symptoms may be an emergency. This information is not intended to replace advice given to you by your health care provider. Make sure you discuss any questions you have with your health care provider. Document Revised: 03/22/2021 Document Reviewed: 03/22/2021 Elsevier Patient Education  2024 [employer].

## 2023-04-29 ENCOUNTER — Encounter (HOSPITAL_COMMUNITY)
Admission: RE | Admit: 2023-04-29 | Discharge: 2023-04-29 | Disposition: A | Discharge status: Home / Self Care | Source: Ambulatory Visit | Attending: Internal Medicine | Admitting: Internal Medicine

## 2023-04-29 ENCOUNTER — Encounter (HOSPITAL_COMMUNITY): Payer: Self-pay

## 2023-04-29 ENCOUNTER — Telehealth: Payer: Self-pay

## 2023-04-29 VITALS — BP 129/88 | HR 75 | Temp 98.0°F | Resp 18 | Ht 68.0 in | Wt 193.8 lb

## 2023-04-29 DIAGNOSIS — N179 Acute kidney failure, unspecified: Secondary | ICD-10-CM | POA: Diagnosis not present

## 2023-04-29 DIAGNOSIS — J811 Chronic pulmonary edema: Secondary | ICD-10-CM | POA: Diagnosis not present

## 2023-04-29 DIAGNOSIS — Z01812 Encounter for preprocedural laboratory examination: Secondary | ICD-10-CM | POA: Diagnosis not present

## 2023-04-29 DIAGNOSIS — E119 Type 2 diabetes mellitus without complications: Secondary | ICD-10-CM | POA: Diagnosis not present

## 2023-04-29 DIAGNOSIS — S59902A Unspecified injury of left elbow, initial encounter: Secondary | ICD-10-CM | POA: Diagnosis not present

## 2023-04-29 DIAGNOSIS — I4891 Unspecified atrial fibrillation: Secondary | ICD-10-CM | POA: Diagnosis not present

## 2023-04-29 DIAGNOSIS — D62 Acute posthemorrhagic anemia: Secondary | ICD-10-CM | POA: Diagnosis not present

## 2023-04-29 DIAGNOSIS — Z299 Encounter for prophylactic measures, unspecified: Secondary | ICD-10-CM | POA: Diagnosis not present

## 2023-04-29 DIAGNOSIS — Z7901 Long term (current) use of anticoagulants: Secondary | ICD-10-CM | POA: Diagnosis not present

## 2023-04-29 DIAGNOSIS — Z79890 Hormone replacement therapy (postmenopausal): Secondary | ICD-10-CM | POA: Diagnosis not present

## 2023-04-29 DIAGNOSIS — D649 Anemia, unspecified: Secondary | ICD-10-CM | POA: Insufficient documentation

## 2023-04-29 DIAGNOSIS — E039 Hypothyroidism, unspecified: Secondary | ICD-10-CM | POA: Diagnosis not present

## 2023-04-29 DIAGNOSIS — I10 Essential (primary) hypertension: Secondary | ICD-10-CM | POA: Diagnosis not present

## 2023-04-29 DIAGNOSIS — Z09 Encounter for follow-up examination after completed treatment for conditions other than malignant neoplasm: Secondary | ICD-10-CM | POA: Diagnosis not present

## 2023-04-29 DIAGNOSIS — I272 Pulmonary hypertension, unspecified: Secondary | ICD-10-CM | POA: Diagnosis not present

## 2023-04-29 HISTORY — DX: Cardiac arrhythmia, unspecified: I49.9

## 2023-04-29 LAB — BASIC METABOLIC PANEL WITH GFR
Anion gap: 11 (ref 5–15)
BUN: 23 mg/dL (ref 8–23)
CO2: 20 mmol/L — ABNORMAL LOW (ref 22–32)
Calcium: 9.4 mg/dL (ref 8.9–10.3)
Chloride: 101 mmol/L (ref 98–111)
Creatinine, Ser: 0.95 mg/dL (ref 0.61–1.24)
GFR, Estimated: >60 mL/min (ref >60)
Glucose, Bld: 249 mg/dL — ABNORMAL HIGH (ref 70–99)
Potassium: 4.3 mmol/L (ref 3.5–5.1)
Sodium: 132 mmol/L — ABNORMAL LOW (ref 135–145)

## 2023-04-29 LAB — CBC WITH DIFFERENTIAL/PLATELET
Abs Immature Granulocytes: 0.05 10*3/uL (ref 0.00–0.07)
Basophils Absolute: 0 10*3/uL (ref 0.0–0.1)
Basophils Relative: 0 %
Eosinophils Absolute: 0 10*3/uL (ref 0.0–0.5)
Eosinophils Relative: 0 %
HCT: 24.2 % — ABNORMAL LOW (ref 39.0–52.0)
Hemoglobin: 6.9 g/dL — CL (ref 13.0–17.0)
Immature Granulocytes: 1 %
Lymphocytes Relative: 8 %
Lymphs Abs: 0.5 10*3/uL — ABNORMAL LOW (ref 0.7–4.0)
MCH: 21 pg — ABNORMAL LOW (ref 26.0–34.0)
MCHC: 28.5 g/dL — ABNORMAL LOW (ref 30.0–36.0)
MCV: 73.6 fL — ABNORMAL LOW (ref 80.0–100.0)
Monocytes Absolute: 0.2 10*3/uL (ref 0.1–1.0)
Monocytes Relative: 4 %
Neutro Abs: 5.1 10*3/uL (ref 1.7–7.7)
Neutrophils Relative %: 87 %
Platelets: 324 10*3/uL (ref 150–400)
RBC: 3.29 MIL/uL — ABNORMAL LOW (ref 4.22–5.81)
RDW: 18.7 % — ABNORMAL HIGH (ref 11.5–15.5)
WBC: 5.9 10*3/uL (ref 4.0–10.5)
nRBC: 0 % (ref 0.0–0.2)

## 2023-04-29 NOTE — Telephone Encounter (Signed)
 [facility] called with a critical hgb of 6.9. Dr. Riley Cheadle was notified. Dr. Riley Cheadle advised me to contact pt to arrange for him to go to the ED tomorrow morning for a unit of prbc's. Contacted pt and pt's significant other per pt's request and they verbalized understanding. Verified that pt stopped Xarelto  today for upcoming procedure.

## 2023-04-30 ENCOUNTER — Telehealth: Payer: Self-pay | Admitting: Internal Medicine

## 2023-04-30 ENCOUNTER — Encounter: Payer: Self-pay | Admitting: *Deleted

## 2023-04-30 DIAGNOSIS — Z885 Allergy status to narcotic agent status: Secondary | ICD-10-CM | POA: Diagnosis not present

## 2023-04-30 DIAGNOSIS — Z7984 Long term (current) use of oral hypoglycemic drugs: Secondary | ICD-10-CM | POA: Diagnosis not present

## 2023-04-30 DIAGNOSIS — E890 Postprocedural hypothyroidism: Secondary | ICD-10-CM | POA: Diagnosis not present

## 2023-04-30 DIAGNOSIS — I272 Pulmonary hypertension, unspecified: Secondary | ICD-10-CM | POA: Diagnosis not present

## 2023-04-30 DIAGNOSIS — E119 Type 2 diabetes mellitus without complications: Secondary | ICD-10-CM | POA: Diagnosis not present

## 2023-04-30 DIAGNOSIS — R918 Other nonspecific abnormal finding of lung field: Secondary | ICD-10-CM | POA: Diagnosis not present

## 2023-04-30 DIAGNOSIS — I3481 Nonrheumatic mitral (valve) annulus calcification: Secondary | ICD-10-CM | POA: Diagnosis not present

## 2023-04-30 DIAGNOSIS — Z1152 Encounter for screening for COVID-19: Secondary | ICD-10-CM | POA: Diagnosis not present

## 2023-04-30 DIAGNOSIS — Z794 Long term (current) use of insulin: Secondary | ICD-10-CM | POA: Diagnosis not present

## 2023-04-30 DIAGNOSIS — R062 Wheezing: Secondary | ICD-10-CM | POA: Diagnosis not present

## 2023-04-30 DIAGNOSIS — D649 Anemia, unspecified: Secondary | ICD-10-CM | POA: Diagnosis not present

## 2023-04-30 DIAGNOSIS — M170 Bilateral primary osteoarthritis of knee: Secondary | ICD-10-CM | POA: Diagnosis not present

## 2023-04-30 DIAGNOSIS — J984 Other disorders of lung: Secondary | ICD-10-CM | POA: Diagnosis not present

## 2023-04-30 DIAGNOSIS — K922 Gastrointestinal hemorrhage, unspecified: Secondary | ICD-10-CM | POA: Diagnosis not present

## 2023-04-30 DIAGNOSIS — I2510 Atherosclerotic heart disease of native coronary artery without angina pectoris: Secondary | ICD-10-CM | POA: Diagnosis not present

## 2023-04-30 DIAGNOSIS — I4891 Unspecified atrial fibrillation: Secondary | ICD-10-CM | POA: Diagnosis not present

## 2023-04-30 DIAGNOSIS — D62 Acute posthemorrhagic anemia: Secondary | ICD-10-CM | POA: Diagnosis not present

## 2023-04-30 DIAGNOSIS — I517 Cardiomegaly: Secondary | ICD-10-CM | POA: Diagnosis not present

## 2023-04-30 DIAGNOSIS — J90 Pleural effusion, not elsewhere classified: Secondary | ICD-10-CM | POA: Diagnosis not present

## 2023-04-30 DIAGNOSIS — E785 Hyperlipidemia, unspecified: Secondary | ICD-10-CM | POA: Diagnosis not present

## 2023-04-30 DIAGNOSIS — Z7901 Long term (current) use of anticoagulants: Secondary | ICD-10-CM | POA: Diagnosis not present

## 2023-04-30 DIAGNOSIS — N179 Acute kidney failure, unspecified: Secondary | ICD-10-CM | POA: Diagnosis not present

## 2023-04-30 NOTE — Telephone Encounter (Signed)
 Discharge summary from [facility] reviewed.  He received 1 unit of packed RBCs.  Hemoglobin 8.0 this morning.  Was discharged.  If the patient has been on clear liquids since supper last night and has held his Eliquis we can proceed with a prep for EGD and colonoscopy tomorrow.  If he is eaten or wants to hold off we need to reschedule.

## 2023-04-30 NOTE — Telephone Encounter (Signed)
 Pt has been rescheduled for 05/15/23, updated instructions mailed.

## 2023-04-30 NOTE — Telephone Encounter (Signed)
 Informed pt's significant other that provider wants to have the discharge summary before pt is rescheduled. Advised that hospital called and will be faxing over the discharge summary. Will be in contact once provider has had a chance to look at discharge summary

## 2023-04-30 NOTE — Telephone Encounter (Signed)
 LMOVM to return call.

## 2023-04-30 NOTE — Telephone Encounter (Signed)
 Pt has been scheduled for 05/15/23. He has eaten [employer] today and his significant other wasn't sure if he was holding his Xarelto . Updated instructions mailed to pt.

## 2023-04-30 NOTE — Telephone Encounter (Signed)
 Pt was admitted to [facility] last night. Pt has cancelled procedure for 05/01/2023. Will call back once out of the hospital to reschedule.

## 2023-04-30 NOTE — Telephone Encounter (Signed)
 Received voicemail from [employer] stating that pt is being discharged today and wanted to get him rescheduled for his procedure. Does he need to come into office prior to rescheduling him or is it ok to reschedule?  Please advise. Thank you

## 2023-05-01 ENCOUNTER — Encounter: Payer: Self-pay | Admitting: *Deleted

## 2023-05-01 ENCOUNTER — Encounter (HOSPITAL_COMMUNITY): Admission: RE | Payer: Self-pay | Source: Home / Self Care

## 2023-05-01 ENCOUNTER — Ambulatory Visit (HOSPITAL_COMMUNITY): Admission: RE | Admit: 2023-05-01 | Source: Home / Self Care | Admitting: Internal Medicine

## 2023-05-01 SURGERY — COLONOSCOPY
Anesthesia: Choice

## 2023-05-01 NOTE — Telephone Encounter (Signed)
 noted

## 2023-05-11 DIAGNOSIS — I10 Essential (primary) hypertension: Secondary | ICD-10-CM | POA: Diagnosis not present

## 2023-05-11 DIAGNOSIS — R600 Localized edema: Secondary | ICD-10-CM | POA: Diagnosis not present

## 2023-05-11 DIAGNOSIS — Z79899 Other long term (current) drug therapy: Secondary | ICD-10-CM | POA: Diagnosis not present

## 2023-05-11 DIAGNOSIS — Z299 Encounter for prophylactic measures, unspecified: Secondary | ICD-10-CM | POA: Diagnosis not present

## 2023-05-11 DIAGNOSIS — R9389 Abnormal findings on diagnostic imaging of other specified body structures: Secondary | ICD-10-CM | POA: Diagnosis not present

## 2023-05-13 NOTE — Patient Instructions (Signed)
 Brett Matthews  05/13/2023     @PREFPERIOPPHARMACY @   Your procedure is scheduled on  05/15/2023.   Report to [facility] at  0715  A.M.   Call this number if you have problems the morning of surgery:  [phone]  If you experience any cold or flu symptoms such as cough, fever, chills, shortness of breath, etc. between now and your scheduled surgery, please notify us  at the above number.   Remember:        Your last dose of xarelto  should have been on 05/12/2023.          Take 1/2 of your usual night time dose of insulin  the night before your procedure.         DO NOT take any medications for diabetes the morning of your procedure.    Follow the diet and prep instructions given to you by the office.   You may drink clear liquids until  0515 am on 05/15/2023.    Clear liquids allowed are:                    Water , Juice (No red color; non-citric and without pulp; diabetics please choose diet or no sugar options), Carbonated beverages (diabetics please choose diet or no sugar options), Clear Tea (No creamer, milk, or cream, including half & half and powdered creamer), Black Coffee Only (No creamer, milk or cream, including half & half and powdered creamer), Plain Jell-O Only (No red color; diabetics please choose no sugar options), Clear Sports drink (No red color; diabetics please choose diet or no sugar options), and Plain Popsicles Only (No red color; diabetics please choose no sugar options)    Take these medicines the morning of surgery with A SIP OF WATER           carvedilol , citalopram , diltiazem , famotidine , gabapentin , levothyroxine , pantoprazole , tamsulosin .    Do not wear jewelry, make-up or nail polish, including gel polish,  artificial nails, or any other type of covering on natural nails (fingers and  toes).  Do not wear lotions, powders, or perfumes, or deodorant.  Do not shave 48 hours prior to surgery.  Men may shave face and neck.  Do not bring  valuables to the hospital.  [facility] is not responsible for any belongings or valuables.  Contacts, dentures or bridgework may not be worn into surgery.  Leave your suitcase in the car.  After surgery it may be brought to your room.  For patients admitted to the hospital, discharge time will be determined by your treatment team.  Patients discharged the day of surgery will not be allowed to drive home and must have someone with them for 24 hours.    Special instructions:   DO NOT smoke tobacco or vape for 24 hours before your procedure.  Please read over the following fact sheets that you were given. Anesthesia Post-op Instructions and Care and Recovery After Surgery      Upper Endoscopy, Adult, Care After After the procedure, it is common to have a sore throat. It is also common to have: Mild stomach pain or discomfort. Bloating. Nausea. Follow these instructions at home: The instructions below may help you care for yourself at home. Your health care provider may give you more instructions. If you have questions, ask your health care provider. If you were given a sedative during the procedure, it can affect you for several hours. Do not drive or operate  machinery until your health care provider says that it is safe. If you will be going home right after the procedure, plan to have a responsible adult: Take you home from the hospital or clinic. You will not be allowed to drive. Care for you for the time you are told. Follow instructions from your health care provider about what you may eat and drink. Return to your normal activities as told by your health care provider. Ask your health care provider what activities are safe for you. Take over-the-counter and prescription medicines only as told by your health care provider. Contact a health care provider if you: Have a sore throat that lasts longer than one day. Have trouble swallowing. Have a fever. Get help right away if  you: Vomit blood or your vomit looks like coffee grounds. Have bloody, black, or tarry stools. Have a very bad sore throat or you cannot swallow. Have difficulty breathing or very bad pain in your chest or abdomen. These symptoms may be an emergency. Get help right away. Call 911. Do not wait to see if the symptoms will go away. Do not drive yourself to the hospital. Summary After the procedure, it is common to have a sore throat, mild stomach discomfort, bloating, and nausea. If you were given a sedative during the procedure, it can affect you for several hours. Do not drive until your health care provider says that it is safe. Follow instructions from your health care provider about what you may eat and drink. Return to your normal activities as told by your health care provider. This information is not intended to replace advice given to you by your health care provider. Make sure you discuss any questions you have with your health care provider. Document Revised: 04/03/2021 Document Reviewed: 04/03/2021 Elsevier Patient Education  2024 [employer]Colonoscopy, Adult, Care After The following information offers guidance on how to care for yourself after your procedure. Your health care provider may also give you more specific instructions. If you have problems or questions, contact your health care provider. What can I expect after the procedure? After the procedure, it is common to have: A small amount of blood in your stool for 24 hours after the procedure. Some gas. Mild cramping or bloating of your abdomen. Follow these instructions at home: Eating and drinking  Drink enough fluid to keep your urine pale yellow. Follow instructions from your health care provider about eating or drinking restrictions. Resume your normal diet as told by your health care provider. Avoid heavy or fried foods that are hard to digest. Activity Rest as told by your health care provider. Avoid sitting  for a long time without moving. Get up to take short walks every 1-2 hours. This is important to improve blood flow and breathing. Ask for help if you feel weak or unsteady. Return to your normal activities as told by your health care provider. Ask your health care provider what activities are safe for you. Managing cramping and bloating  Try walking around when you have cramps or feel bloated. If directed, apply heat to your abdomen as told by your health care provider. Use the heat source that your health care provider recommends, such as a moist heat pack or a heating pad. Place a towel between your skin and the heat source. Leave the heat on for 20-30 minutes. Remove the heat if your skin turns bright red. This is especially important if you are unable to feel pain, heat, or cold. You have  a greater risk of getting burned. General instructions If you were given a sedative during the procedure, it can affect you for several hours. Do not drive or operate machinery until your health care provider says that it is safe. For the first 24 hours after the procedure: Do not sign important documents. Do not drink alcohol . Do your regular daily activities at a slower pace than normal. Eat soft foods that are easy to digest. Take over-the-counter and prescription medicines only as told by your health care provider. Keep all follow-up visits. This is important. Contact a health care provider if: You have blood in your stool 2-3 days after the procedure. Get help right away if: You have more than a small spotting of blood in your stool. You have large blood clots in your stool. You have swelling of your abdomen. You have nausea or vomiting. You have a fever. You have increasing pain in your abdomen that is not relieved with medicine. These symptoms may be an emergency. Get help right away. Call 911. Do not wait to see if the symptoms will go away. Do not drive yourself to the  hospital. Summary After the procedure, it is common to have a small amount of blood in your stool. You may also have mild cramping and bloating of your abdomen. If you were given a sedative during the procedure, it can affect you for several hours. Do not drive or operate machinery until your health care provider says that it is safe. Get help right away if you have a lot of blood in your stool, nausea or vomiting, a fever, or increased pain in your abdomen. This information is not intended to replace advice given to you by your health care provider. Make sure you discuss any questions you have with your health care provider. Document Revised: 02/04/2022 Document Reviewed: 08/15/2020 Elsevier Patient Education  2024 [employer]General Anesthesia, Adult, Care After The following information offers guidance on how to care for yourself after your procedure. Your health care provider may also give you more specific instructions. If you have problems or questions, contact your health care provider. What can I expect after the procedure? After the procedure, it is common for people to: Have pain or discomfort at the IV site. Have nausea or vomiting. Have a sore throat or hoarseness. Have trouble concentrating. Feel cold or chills. Feel weak, sleepy, or tired (fatigue). Have soreness and body aches. These can affect parts of the body that were not involved in surgery. Follow these instructions at home: For the time period you were told by your health care provider:  Rest. Do not participate in activities where you could fall or become injured. Do not drive or use machinery. Do not drink alcohol . Do not take sleeping pills or medicines that cause drowsiness. Do not make important decisions or sign legal documents. Do not take care of children on your own. General instructions Drink enough fluid to keep your urine pale yellow. If you have sleep apnea, surgery and certain medicines can  increase your risk for breathing problems. Follow instructions from your health care provider about wearing your sleep device: Anytime you are sleeping, including during daytime naps. While taking prescription pain medicines, sleeping medicines, or medicines that make you drowsy. Return to your normal activities as told by your health care provider. Ask your health care provider what activities are safe for you. Take over-the-counter and prescription medicines only as told by your health care provider. Do not use any products  that contain nicotine or tobacco. These products include cigarettes, chewing tobacco, and vaping devices, such as e-cigarettes. These can delay incision healing after surgery. If you need help quitting, ask your health care provider. Contact a health care provider if: You have nausea or vomiting that does not get better with medicine. You vomit every time you eat or drink. You have pain that does not get better with medicine. You cannot urinate or have bloody urine. You develop a skin rash. You have a fever. Get help right away if: You have trouble breathing. You have chest pain. You vomit blood. These symptoms may be an emergency. Get help right away. Call 911. Do not wait to see if the symptoms will go away. Do not drive yourself to the hospital. Summary After the procedure, it is common to have a sore throat, hoarseness, nausea, vomiting, or to feel weak, sleepy, or fatigue. For the time period you were told by your health care provider, do not drive or use machinery. Get help right away if you have difficulty breathing, have chest pain, or vomit blood. These symptoms may be an emergency. This information is not intended to replace advice given to you by your health care provider. Make sure you discuss any questions you have with your health care provider. Document Revised: 03/22/2021 Document Reviewed: 03/22/2021 Elsevier Patient Education  2024 [employer].

## 2023-05-14 ENCOUNTER — Encounter (HOSPITAL_COMMUNITY)
Admission: RE | Admit: 2023-05-14 | Discharge: 2023-05-14 | Disposition: A | Discharge status: Home / Self Care | Source: Ambulatory Visit | Attending: Internal Medicine | Admitting: Internal Medicine

## 2023-05-14 DIAGNOSIS — E119 Type 2 diabetes mellitus without complications: Secondary | ICD-10-CM

## 2023-05-14 DIAGNOSIS — D649 Anemia, unspecified: Secondary | ICD-10-CM

## 2023-05-14 DIAGNOSIS — Z01812 Encounter for preprocedural laboratory examination: Secondary | ICD-10-CM | POA: Insufficient documentation

## 2023-05-14 LAB — BASIC METABOLIC PANEL WITH GFR
Anion gap: 8 (ref 5–15)
BUN: 11 mg/dL (ref 8–23)
CO2: 21 mmol/L — ABNORMAL LOW (ref 22–32)
Calcium: 9.3 mg/dL (ref 8.9–10.3)
Chloride: 103 mmol/L (ref 98–111)
Creatinine, Ser: 0.87 mg/dL (ref 0.61–1.24)
GFR, Estimated: >60 mL/min (ref >60)
Glucose, Bld: 110 mg/dL — ABNORMAL HIGH (ref 70–99)
Potassium: 3.4 mmol/L — ABNORMAL LOW (ref 3.5–5.1)
Sodium: 132 mmol/L — ABNORMAL LOW (ref 135–145)

## 2023-05-14 LAB — CBC WITH DIFFERENTIAL/PLATELET
Abs Immature Granulocytes: 0.05 10*3/uL (ref 0.00–0.07)
Basophils Absolute: 0.1 10*3/uL (ref 0.0–0.1)
Basophils Relative: 1 %
Eosinophils Absolute: 0.1 10*3/uL (ref 0.0–0.5)
Eosinophils Relative: 1 %
HCT: 27.5 % — ABNORMAL LOW (ref 39.0–52.0)
Hemoglobin: 7.7 g/dL — ABNORMAL LOW (ref 13.0–17.0)
Immature Granulocytes: 1 %
Lymphocytes Relative: 10 %
Lymphs Abs: 0.9 10*3/uL (ref 0.7–4.0)
MCH: 21.1 pg — ABNORMAL LOW (ref 26.0–34.0)
MCHC: 28 g/dL — ABNORMAL LOW (ref 30.0–36.0)
MCV: 75.3 fL — ABNORMAL LOW (ref 80.0–100.0)
Monocytes Absolute: 0.7 10*3/uL (ref 0.1–1.0)
Monocytes Relative: 9 %
Neutro Abs: 6.8 10*3/uL (ref 1.7–7.7)
Neutrophils Relative %: 78 %
Platelets: 363 10*3/uL (ref 150–400)
RBC: 3.65 MIL/uL — ABNORMAL LOW (ref 4.22–5.81)
RDW: 19.9 % — ABNORMAL HIGH (ref 11.5–15.5)
WBC: 8.6 10*3/uL (ref 4.0–10.5)
nRBC: 0 % (ref 0.0–0.2)

## 2023-05-15 ENCOUNTER — Encounter (HOSPITAL_COMMUNITY): Payer: Self-pay | Admitting: Internal Medicine

## 2023-05-15 ENCOUNTER — Encounter (HOSPITAL_COMMUNITY): Payer: Self-pay | Admitting: Anesthesiology

## 2023-05-15 ENCOUNTER — Inpatient Hospital Stay (HOSPITAL_COMMUNITY)
Admission: RE | Admit: 2023-05-15 | Discharge: 2023-05-22 | DRG: 811 | Disposition: A | Discharge status: Home / Self Care | Attending: Internal Medicine | Admitting: Internal Medicine

## 2023-05-15 ENCOUNTER — Encounter (HOSPITAL_COMMUNITY)
Admission: RE | Disposition: A | Discharge status: Home / Self Care | Payer: Self-pay | Source: Home / Self Care | Attending: Internal Medicine

## 2023-05-15 DIAGNOSIS — E878 Other disorders of electrolyte and fluid balance, not elsewhere classified: Secondary | ICD-10-CM | POA: Diagnosis not present

## 2023-05-15 DIAGNOSIS — E785 Hyperlipidemia, unspecified: Secondary | ICD-10-CM | POA: Diagnosis present

## 2023-05-15 DIAGNOSIS — K3189 Other diseases of stomach and duodenum: Principal | ICD-10-CM | POA: Diagnosis present

## 2023-05-15 DIAGNOSIS — Z299 Encounter for prophylactic measures, unspecified: Secondary | ICD-10-CM | POA: Diagnosis not present

## 2023-05-15 DIAGNOSIS — K648 Other hemorrhoids: Secondary | ICD-10-CM | POA: Diagnosis present

## 2023-05-15 DIAGNOSIS — Z6830 Body mass index (BMI) 30.0-30.9, adult: Secondary | ICD-10-CM | POA: Diagnosis not present

## 2023-05-15 DIAGNOSIS — E890 Postprocedural hypothyroidism: Secondary | ICD-10-CM | POA: Diagnosis present

## 2023-05-15 DIAGNOSIS — K5909 Other constipation: Secondary | ICD-10-CM | POA: Diagnosis present

## 2023-05-15 DIAGNOSIS — E66811 Obesity, class 1: Secondary | ICD-10-CM | POA: Diagnosis present

## 2023-05-15 DIAGNOSIS — I10 Essential (primary) hypertension: Secondary | ICD-10-CM

## 2023-05-15 DIAGNOSIS — K219 Gastro-esophageal reflux disease without esophagitis: Secondary | ICD-10-CM | POA: Diagnosis not present

## 2023-05-15 DIAGNOSIS — K2990 Gastroduodenitis, unspecified, without bleeding: Secondary | ICD-10-CM | POA: Diagnosis not present

## 2023-05-15 DIAGNOSIS — F32A Depression, unspecified: Secondary | ICD-10-CM | POA: Diagnosis not present

## 2023-05-15 DIAGNOSIS — K2970 Gastritis, unspecified, without bleeding: Secondary | ICD-10-CM

## 2023-05-15 DIAGNOSIS — G4700 Insomnia, unspecified: Secondary | ICD-10-CM | POA: Diagnosis present

## 2023-05-15 DIAGNOSIS — Z09 Encounter for follow-up examination after completed treatment for conditions other than malignant neoplasm: Secondary | ICD-10-CM | POA: Diagnosis not present

## 2023-05-15 DIAGNOSIS — D62 Acute posthemorrhagic anemia: Secondary | ICD-10-CM | POA: Diagnosis not present

## 2023-05-15 DIAGNOSIS — E1165 Type 2 diabetes mellitus with hyperglycemia: Secondary | ICD-10-CM | POA: Diagnosis not present

## 2023-05-15 DIAGNOSIS — J449 Chronic obstructive pulmonary disease, unspecified: Secondary | ICD-10-CM | POA: Diagnosis not present

## 2023-05-15 DIAGNOSIS — R1030 Lower abdominal pain, unspecified: Secondary | ICD-10-CM | POA: Diagnosis not present

## 2023-05-15 DIAGNOSIS — K5730 Diverticulosis of large intestine without perforation or abscess without bleeding: Secondary | ICD-10-CM | POA: Diagnosis present

## 2023-05-15 DIAGNOSIS — E876 Hypokalemia: Secondary | ICD-10-CM | POA: Diagnosis not present

## 2023-05-15 DIAGNOSIS — I4891 Unspecified atrial fibrillation: Secondary | ICD-10-CM | POA: Diagnosis not present

## 2023-05-15 DIAGNOSIS — Z8249 Family history of ischemic heart disease and other diseases of the circulatory system: Secondary | ICD-10-CM

## 2023-05-15 DIAGNOSIS — Z79890 Hormone replacement therapy (postmenopausal): Secondary | ICD-10-CM

## 2023-05-15 DIAGNOSIS — E1140 Type 2 diabetes mellitus with diabetic neuropathy, unspecified: Secondary | ICD-10-CM | POA: Diagnosis not present

## 2023-05-15 DIAGNOSIS — D132 Benign neoplasm of duodenum: Secondary | ICD-10-CM | POA: Diagnosis present

## 2023-05-15 DIAGNOSIS — Z794 Long term (current) use of insulin: Secondary | ICD-10-CM

## 2023-05-15 DIAGNOSIS — Z79899 Other long term (current) drug therapy: Secondary | ICD-10-CM

## 2023-05-15 DIAGNOSIS — Z01812 Encounter for preprocedural laboratory examination: Secondary | ICD-10-CM

## 2023-05-15 DIAGNOSIS — R451 Restlessness and agitation: Secondary | ICD-10-CM | POA: Diagnosis not present

## 2023-05-15 DIAGNOSIS — N281 Cyst of kidney, acquired: Secondary | ICD-10-CM | POA: Diagnosis not present

## 2023-05-15 DIAGNOSIS — Z9842 Cataract extraction status, left eye: Secondary | ICD-10-CM

## 2023-05-15 DIAGNOSIS — I480 Paroxysmal atrial fibrillation: Secondary | ICD-10-CM | POA: Diagnosis present

## 2023-05-15 DIAGNOSIS — Z85828 Personal history of other malignant neoplasm of skin: Secondary | ICD-10-CM

## 2023-05-15 DIAGNOSIS — K317 Polyp of stomach and duodenum: Secondary | ICD-10-CM | POA: Diagnosis not present

## 2023-05-15 DIAGNOSIS — I2510 Atherosclerotic heart disease of native coronary artery without angina pectoris: Secondary | ICD-10-CM | POA: Diagnosis not present

## 2023-05-15 DIAGNOSIS — F05 Delirium due to known physiological condition: Secondary | ICD-10-CM | POA: Diagnosis not present

## 2023-05-15 DIAGNOSIS — R1032 Left lower quadrant pain: Secondary | ICD-10-CM | POA: Diagnosis not present

## 2023-05-15 DIAGNOSIS — Z781 Physical restraint status: Secondary | ICD-10-CM

## 2023-05-15 DIAGNOSIS — D649 Anemia, unspecified: Secondary | ICD-10-CM | POA: Diagnosis not present

## 2023-05-15 DIAGNOSIS — K649 Unspecified hemorrhoids: Secondary | ICD-10-CM | POA: Diagnosis not present

## 2023-05-15 DIAGNOSIS — I119 Hypertensive heart disease without heart failure: Secondary | ICD-10-CM | POA: Diagnosis not present

## 2023-05-15 DIAGNOSIS — I482 Chronic atrial fibrillation, unspecified: Secondary | ICD-10-CM | POA: Diagnosis not present

## 2023-05-15 DIAGNOSIS — Z860102 Personal history of hyperplastic colon polyps: Secondary | ICD-10-CM

## 2023-05-15 DIAGNOSIS — Z9841 Cataract extraction status, right eye: Secondary | ICD-10-CM

## 2023-05-15 DIAGNOSIS — K2971 Gastritis, unspecified, with bleeding: Secondary | ICD-10-CM | POA: Diagnosis not present

## 2023-05-15 DIAGNOSIS — N400 Enlarged prostate without lower urinary tract symptoms: Secondary | ICD-10-CM | POA: Diagnosis present

## 2023-05-15 DIAGNOSIS — Z7984 Long term (current) use of oral hypoglycemic drugs: Secondary | ICD-10-CM

## 2023-05-15 DIAGNOSIS — D509 Iron deficiency anemia, unspecified: Secondary | ICD-10-CM | POA: Diagnosis not present

## 2023-05-15 DIAGNOSIS — Z7901 Long term (current) use of anticoagulants: Secondary | ICD-10-CM

## 2023-05-15 DIAGNOSIS — Z823 Family history of stroke: Secondary | ICD-10-CM

## 2023-05-15 LAB — HEMOGLOBIN A1C
Hgb A1c MFr Bld: 6.3 % — ABNORMAL HIGH (ref 4.8–5.6)
Mean Plasma Glucose: 134.11 mg/dL

## 2023-05-15 LAB — COMPREHENSIVE METABOLIC PANEL WITH GFR
ALT: 43 U/L (ref 0–44)
AST: 33 U/L (ref 15–41)
Albumin: 3.3 g/dL — ABNORMAL LOW (ref 3.5–5.0)
Alkaline Phosphatase: 129 U/L — ABNORMAL HIGH (ref 38–126)
Anion gap: 10 (ref 5–15)
BUN: 9 mg/dL (ref 8–23)
CO2: 20 mmol/L — ABNORMAL LOW (ref 22–32)
Calcium: 9.1 mg/dL (ref 8.9–10.3)
Chloride: 105 mmol/L (ref 98–111)
Creatinine, Ser: 0.85 mg/dL (ref 0.61–1.24)
GFR, Estimated: >60 mL/min (ref >60)
Glucose, Bld: 107 mg/dL — ABNORMAL HIGH (ref 70–99)
Potassium: 3.2 mmol/L — ABNORMAL LOW (ref 3.5–5.1)
Sodium: 135 mmol/L (ref 135–145)
Total Bilirubin: 0.9 mg/dL (ref 0.0–1.2)
Total Protein: 5.8 g/dL — ABNORMAL LOW (ref 6.5–8.1)

## 2023-05-15 LAB — MAGNESIUM: Magnesium: 1.4 mg/dL — ABNORMAL LOW (ref 1.7–2.4)

## 2023-05-15 LAB — GLUCOSE, CAPILLARY
Glucose-Capillary: 103 mg/dL — ABNORMAL HIGH (ref 70–99)
Glucose-Capillary: 101 mg/dL — ABNORMAL HIGH (ref 70–99)
Glucose-Capillary: 140 mg/dL — ABNORMAL HIGH (ref 70–99)
Glucose-Capillary: 152 mg/dL — ABNORMAL HIGH (ref 70–99)

## 2023-05-15 LAB — MRSA NEXT GEN BY PCR, NASAL: MRSA by PCR Next Gen: NOT DETECTED

## 2023-05-15 LAB — PREPARE RBC (CROSSMATCH)

## 2023-05-15 LAB — TSH: TSH: 2.83 u[IU]/mL (ref 0.350–4.500)

## 2023-05-15 SURGERY — CANCELLED PROCEDURE
Anesthesia: Choice

## 2023-05-15 MED ORDER — MELATONIN 3 MG PO TABS
9.0000 mg | ORAL_TABLET | Freq: Every day | ORAL | Status: DC
  Administered 2023-05-15 – 2023-05-21 (×4): 9 mg via ORAL
  Filled 2023-05-15 (×5): qty 3

## 2023-05-15 MED ORDER — PANTOPRAZOLE SODIUM 40 MG IV SOLR
40.0000 mg | Freq: Two times a day (BID) | INTRAVENOUS | Status: DC
  Administered 2023-05-15 – 2023-05-22 (×15): 40 mg via INTRAVENOUS
  Filled 2023-05-15 (×15): qty 10

## 2023-05-15 MED ORDER — DILTIAZEM LOAD VIA INFUSION
20.0000 mg | Freq: Once | INTRAVENOUS | Status: AC
  Administered 2023-05-15: 20 mg via INTRAVENOUS
  Filled 2023-05-15: qty 20

## 2023-05-15 MED ORDER — METOPROLOL TARTRATE 5 MG/5ML IV SOLN
5.0000 mg | Freq: Two times a day (BID) | INTRAVENOUS | Status: DC
  Administered 2023-05-15: 5 mg via INTRAVENOUS
  Filled 2023-05-15: qty 5

## 2023-05-15 MED ORDER — POTASSIUM CHLORIDE CRYS ER 20 MEQ PO TBCR
40.0000 meq | EXTENDED_RELEASE_TABLET | Freq: Once | ORAL | Status: AC
  Administered 2023-05-15: 40 meq via ORAL
  Filled 2023-05-15: qty 2

## 2023-05-15 MED ORDER — ONDANSETRON HCL 4 MG PO TABS: 4.0000 mg | ORAL_TABLET | Freq: Four times a day (QID) | ORAL | Status: DC | PRN

## 2023-05-15 MED ORDER — FUROSEMIDE 10 MG/ML IJ SOLN
20.0000 mg | Freq: Once | INTRAMUSCULAR | Status: AC
  Administered 2023-05-15: 20 mg via INTRAVENOUS
  Filled 2023-05-15: qty 2

## 2023-05-15 MED ORDER — BUDESONIDE 0.5 MG/2ML IN SUSP
0.5000 mg | Freq: Two times a day (BID) | RESPIRATORY_TRACT | Status: DC
  Administered 2023-05-15 – 2023-05-21 (×13): 0.5 mg via RESPIRATORY_TRACT
  Filled 2023-05-15 (×14): qty 2

## 2023-05-15 MED ORDER — ACETAMINOPHEN 325 MG PO TABS
650.0000 mg | ORAL_TABLET | Freq: Four times a day (QID) | ORAL | Status: DC | PRN
  Administered 2023-05-20 – 2023-05-21 (×2): 650 mg via ORAL
  Filled 2023-05-15 (×2): qty 2

## 2023-05-15 MED ORDER — METOPROLOL TARTRATE 5 MG/5ML IV SOLN
5.0000 mg | Freq: Three times a day (TID) | INTRAVENOUS | Status: DC
  Administered 2023-05-15 – 2023-05-20 (×14): 5 mg via INTRAVENOUS
  Filled 2023-05-15 (×14): qty 5

## 2023-05-15 MED ORDER — LABETALOL HCL 5 MG/ML IV SOLN
INTRAVENOUS | Status: AC
  Filled 2023-05-15: qty 4

## 2023-05-15 MED ORDER — LABETALOL HCL 5 MG/ML IV SOLN
5.0000 mg | INTRAVENOUS | Status: AC
  Administered 2023-05-15: 5 mg via INTRAVENOUS

## 2023-05-15 MED ORDER — LEVALBUTEROL HCL 0.63 MG/3ML IN NEBU
0.6300 mg | INHALATION_SOLUTION | Freq: Three times a day (TID) | RESPIRATORY_TRACT | Status: DC | PRN
  Administered 2023-05-18: 0.63 mg via RESPIRATORY_TRACT
  Filled 2023-05-15: qty 3

## 2023-05-15 MED ORDER — TAMSULOSIN HCL 0.4 MG PO CAPS
0.4000 mg | ORAL_CAPSULE | Freq: Every day | ORAL | Status: DC
  Administered 2023-05-15 – 2023-05-22 (×5): 0.4 mg via ORAL
  Filled 2023-05-15 (×5): qty 1

## 2023-05-15 MED ORDER — CHLORHEXIDINE GLUCONATE CLOTH 2 % EX PADS
6.0000 | MEDICATED_PAD | Freq: Every day | CUTANEOUS | Status: DC
  Administered 2023-05-15 – 2023-05-22 (×8): 6 via TOPICAL

## 2023-05-15 MED ORDER — GABAPENTIN 300 MG PO CAPS
300.0000 mg | ORAL_CAPSULE | Freq: Three times a day (TID) | ORAL | Status: DC
  Administered 2023-05-15 – 2023-05-22 (×14): 300 mg via ORAL
  Filled 2023-05-15: qty 1
  Filled 2023-05-15 (×3): qty 3
  Filled 2023-05-15: qty 1
  Filled 2023-05-15 (×6): qty 3
  Filled 2023-05-15 (×2): qty 1
  Filled 2023-05-15 (×3): qty 3

## 2023-05-15 MED ORDER — INSULIN ASPART 100 UNIT/ML IJ SOLN
0.0000 [IU] | Freq: Three times a day (TID) | INTRAMUSCULAR | Status: DC
  Administered 2023-05-15 – 2023-05-16 (×2): 1 [IU] via SUBCUTANEOUS
  Administered 2023-05-17: 3 [IU] via SUBCUTANEOUS
  Administered 2023-05-17 – 2023-05-20 (×2): 2 [IU] via SUBCUTANEOUS
  Administered 2023-05-21: 1 [IU] via SUBCUTANEOUS
  Administered 2023-05-21 – 2023-05-22 (×3): 2 [IU] via SUBCUTANEOUS
  Administered 2023-05-22: 5 [IU] via SUBCUTANEOUS

## 2023-05-15 MED ORDER — INSULIN GLARGINE-YFGN 100 UNIT/ML ~~LOC~~ SOLN
8.0000 [IU] | Freq: Every day | SUBCUTANEOUS | Status: DC
  Administered 2023-05-15 – 2023-05-18 (×3): 8 [IU] via SUBCUTANEOUS
  Filled 2023-05-15 (×4): qty 0.08

## 2023-05-15 MED ORDER — MAGNESIUM SULFATE 2 GM/50ML IV SOLN
2.0000 g | Freq: Once | INTRAVENOUS | Status: AC
  Administered 2023-05-15: 2 g via INTRAVENOUS
  Filled 2023-05-15: qty 50

## 2023-05-15 MED ORDER — INSULIN ASPART 100 UNIT/ML IJ SOLN: 0.0000 [IU] | Freq: Every day | INTRAMUSCULAR | Status: DC

## 2023-05-15 MED ORDER — ARFORMOTEROL TARTRATE 15 MCG/2ML IN NEBU
15.0000 ug | INHALATION_SOLUTION | Freq: Two times a day (BID) | RESPIRATORY_TRACT | Status: DC
  Administered 2023-05-15 – 2023-05-21 (×13): 15 ug via RESPIRATORY_TRACT
  Filled 2023-05-15 (×14): qty 2

## 2023-05-15 MED ORDER — ONDANSETRON HCL 4 MG/2ML IJ SOLN: 4.0000 mg | Freq: Four times a day (QID) | INTRAMUSCULAR | Status: DC | PRN

## 2023-05-15 MED ORDER — FUROSEMIDE 10 MG/ML IJ SOLN
20.0000 mg | Freq: Once | INTRAMUSCULAR | Status: AC
  Administered 2023-05-16: 20 mg via INTRAVENOUS
  Filled 2023-05-15: qty 2

## 2023-05-15 MED ORDER — LEVOTHYROXINE SODIUM 125 MCG PO TABS
125.0000 ug | ORAL_TABLET | Freq: Every day | ORAL | Status: DC
  Administered 2023-05-16 – 2023-05-22 (×5): 125 ug via ORAL
  Filled 2023-05-15 (×5): qty 1

## 2023-05-15 MED ORDER — ACETAMINOPHEN 650 MG RE SUPP: 650.0000 mg | Freq: Four times a day (QID) | RECTAL | Status: DC | PRN

## 2023-05-15 MED ORDER — ATORVASTATIN CALCIUM 10 MG PO TABS
10.0000 mg | ORAL_TABLET | Freq: Every day | ORAL | Status: DC
  Administered 2023-05-15 – 2023-05-22 (×5): 10 mg via ORAL
  Filled 2023-05-15 (×5): qty 1

## 2023-05-15 MED ORDER — FAMOTIDINE 20 MG PO TABS
40.0000 mg | ORAL_TABLET | Freq: Every day | ORAL | Status: DC
  Administered 2023-05-15 – 2023-05-21 (×5): 40 mg via ORAL
  Filled 2023-05-15 (×5): qty 2

## 2023-05-15 MED ORDER — SODIUM CHLORIDE 0.9 % IV SOLN: INTRAVENOUS | Status: DC

## 2023-05-15 MED ORDER — SODIUM CHLORIDE 0.9% IV SOLUTION: Freq: Once | INTRAVENOUS | Status: AC

## 2023-05-15 MED ORDER — CITALOPRAM HYDROBROMIDE 20 MG PO TABS
40.0000 mg | ORAL_TABLET | Freq: Every day | ORAL | Status: DC
  Administered 2023-05-15 – 2023-05-22 (×5): 40 mg via ORAL
  Filled 2023-05-15 (×5): qty 2

## 2023-05-15 MED ORDER — FOLIC ACID 1 MG PO TABS
1.0000 mg | ORAL_TABLET | Freq: Every day | ORAL | Status: DC
  Administered 2023-05-15 – 2023-05-22 (×5): 1 mg via ORAL
  Filled 2023-05-15 (×5): qty 1

## 2023-05-15 MED ORDER — DILTIAZEM HCL-DEXTROSE 125-5 MG/125ML-% IV SOLN (PREMIX)
5.0000 mg/h | INTRAVENOUS | Status: DC
  Administered 2023-05-15: 5 mg/h via INTRAVENOUS
  Administered 2023-05-15 – 2023-05-16 (×3): 15 mg/h via INTRAVENOUS
  Administered 2023-05-17: 12.5 mg/h via INTRAVENOUS
  Administered 2023-05-18: 15 mg/h via INTRAVENOUS
  Administered 2023-05-18: 10 mg/h via INTRAVENOUS
  Administered 2023-05-18 – 2023-05-21 (×8): 15 mg/h via INTRAVENOUS
  Administered 2023-05-22: 5 mg/h via INTRAVENOUS
  Filled 2023-05-15 (×18): qty 125

## 2023-05-15 MED ORDER — LINACLOTIDE 145 MCG PO CAPS
290.0000 ug | ORAL_CAPSULE | Freq: Every day | ORAL | Status: DC
  Administered 2023-05-17 – 2023-05-22 (×4): 290 ug via ORAL
  Filled 2023-05-15 (×4): qty 2

## 2023-05-15 MED ORDER — HYDRALAZINE HCL 50 MG PO TABS
50.0000 mg | ORAL_TABLET | Freq: Three times a day (TID) | ORAL | Status: DC
  Administered 2023-05-15 – 2023-05-22 (×14): 50 mg via ORAL
  Filled 2023-05-15 (×19): qty 1

## 2023-05-15 NOTE — Plan of Care (Signed)
 Problem: Education: Goal: Ability to describe self-care measures that may prevent or decrease complications (Diabetes Survival Skills Education) will improve 05/15/2023 1509 by Natalie Bailey, RN Outcome: Not Progressing 05/15/2023 1508 by Natalie Bailey, RN Outcome: Progressing Goal: Individualized Educational Video(s) 05/15/2023 1509 by Natalie Bailey, RN Outcome: Not Progressing 05/15/2023 1508 by Natalie Bailey, RN Outcome: Progressing   Problem: Coping: Goal: Ability to adjust to condition or change in health will improve 05/15/2023 1509 by Natalie Bailey, RN Outcome: Progressing 05/15/2023 1508 by Natalie Bailey, RN Outcome: Progressing   Problem: Fluid Volume: Goal: Ability to maintain a balanced intake and output will improve 05/15/2023 1509 by Natalie Bailey, RN Outcome: Progressing 05/15/2023 1508 by Natalie Bailey, RN Outcome: Progressing   Problem: Health Behavior/Discharge Planning: Goal: Ability to identify and utilize available resources and services will improve 05/15/2023 1509 by Natalie Bailey, RN Outcome: Progressing 05/15/2023 1508 by Natalie Bailey, RN Outcome: Progressing Goal: Ability to manage health-related needs will improve 05/15/2023 1509 by Natalie Bailey, RN Outcome: Progressing 05/15/2023 1508 by Natalie Bailey, RN Outcome: Progressing   Problem: Metabolic: Goal: Ability to maintain appropriate glucose levels will improve 05/15/2023 1509 by Natalie Bailey, RN Outcome: Progressing 05/15/2023 1508 by Natalie Bailey, RN Outcome: Progressing   Problem: Nutritional: Goal: Maintenance of adequate nutrition will improve 05/15/2023 1509 by Natalie Bailey, RN Outcome: Not Progressing 05/15/2023 1508 by Natalie Bailey, RN Outcome: Progressing Goal: Progress toward achieving an optimal weight will improve 05/15/2023 1509 by Natalie Bailey, RN Outcome: Not Progressing 05/15/2023 1508 by Natalie Bailey,  RN Outcome: Progressing   Problem: Skin Integrity: Goal: Risk for impaired skin integrity will decrease 05/15/2023 1509 by Natalie Bailey, RN Outcome: Progressing 05/15/2023 1508 by Natalie Bailey, RN Outcome: Progressing   Problem: Tissue Perfusion: Goal: Adequacy of tissue perfusion will improve 05/15/2023 1509 by Natalie Bailey, RN Outcome: Progressing 05/15/2023 1508 by Natalie Bailey, RN Outcome: Progressing   Problem: Education: Goal: Knowledge of General Education information will improve Description: Including pain rating scale, medication(s)/side effects and non-pharmacologic comfort measures 05/15/2023 1509 by Natalie Bailey, RN Outcome: Not Progressing 05/15/2023 1508 by Natalie Bailey, RN Outcome: Not Progressing   Problem: Health Behavior/Discharge Planning: Goal: Ability to manage health-related needs will improve 05/15/2023 1509 by Natalie Bailey, RN Outcome: Progressing 05/15/2023 1508 by Natalie Bailey, RN Outcome: Progressing   Problem: Clinical Measurements: Goal: Ability to maintain clinical measurements within normal limits will improve 05/15/2023 1509 by Natalie Bailey, RN Outcome: Progressing 05/15/2023 1508 by Natalie Bailey, RN Outcome: Progressing Goal: Will remain free from infection 05/15/2023 1509 by Natalie Bailey, RN Outcome: Progressing 05/15/2023 1508 by Natalie Bailey, RN Outcome: Progressing Goal: Diagnostic test results will improve 05/15/2023 1509 by Natalie Bailey, RN Outcome: Progressing 05/15/2023 1508 by Natalie Bailey, RN Outcome: Progressing Goal: Respiratory complications will improve 05/15/2023 1509 by Natalie Bailey, RN Outcome: Progressing 05/15/2023 1508 by Natalie Bailey, RN Outcome: Progressing Goal: Cardiovascular complication will be avoided 05/15/2023 1509 by Natalie Bailey, RN Outcome: Not Progressing 05/15/2023 1508 by Natalie Bailey, RN Outcome: Not Progressing    Problem: Activity: Goal: Risk for activity intolerance will decrease 05/15/2023 1509 by Natalie Bailey, RN Outcome: Progressing 05/15/2023 1508 by Natalie Bailey, RN Outcome: Progressing   Problem: Nutrition: Goal: Adequate nutrition will be maintained 05/15/2023 1509 by Natalie Bailey, RN Outcome: Not Progressing 05/15/2023  1508 by Natalie Bailey, RN Outcome: Not Progressing   Problem: Coping: Goal: Level of anxiety will decrease 05/15/2023 1509 by Natalie Bailey, RN Outcome: Progressing 05/15/2023 1508 by Natalie Bailey, RN Outcome: Progressing   Problem: Elimination: Goal: Will not experience complications related to bowel motility 05/15/2023 1509 by Natalie Bailey, RN Outcome: Progressing 05/15/2023 1508 by Natalie Bailey, RN Outcome: Progressing Goal: Will not experience complications related to urinary retention 05/15/2023 1509 by Natalie Bailey, RN Outcome: Progressing 05/15/2023 1508 by Natalie Bailey, RN Outcome: Progressing   Problem: Pain Managment: Goal: General experience of comfort will improve and/or be controlled 05/15/2023 1509 by Natalie Bailey, RN Outcome: Progressing 05/15/2023 1508 by Natalie Bailey, RN Outcome: Progressing   Problem: Safety: Goal: Ability to remain free from injury will improve 05/15/2023 1509 by Natalie Bailey, RN Outcome: Not Progressing 05/15/2023 1508 by Natalie Bailey, RN Outcome: Progressing   Problem: Skin Integrity: Goal: Risk for impaired skin integrity will decrease 05/15/2023 1509 by Natalie Bailey, RN Outcome: Progressing 05/15/2023 1508 by Natalie Bailey, RN Outcome: Progressing

## 2023-05-15 NOTE — H&P (Signed)
 [facility] History and Physical  Brett Matthews ZOX:[id number] DOB: 1932/11/20 DOA: 05/15/2023  Referring physician: Dr. Riley Cheadle  PCP: Orlena Bitters, MD   Chief Complaint: Symptomatic acute blood loss anemia and uncontrolled A-fib with RVR  HPI: Brett Matthews is a 89 y.o. male atrial fibrillation on chronic anticoagulation with Xarelto , GERD, hypertension, type 2 diabetes with neuropathy, BPH, hypothyroidism, COPD, hyperlipidemia and depression; who presented to the hospital for endoscopy and colonoscopy in order to work his anemia up.  Patient was found with a hemoglobin down to 7.0 and the presence of acute atrial fibrillation with RVR.  Patient was also more short of breath than his baseline and unfortunately unstable for the procedures to be done. TR[facility] were consulted to assist with management and resuscitation. Patient reports no chest pain, no dysuria, no hematuria, no abdominal pain, no sick contacts and also expressed no nausea or vomiting.  Patient's magnesium  1.4 and his potassium 3.4  Review of Systems:  Constitutional:  No weight loss, night sweats, Fevers, chills, fatigue.  HEENT:  No headaches, Difficulty swallowing,Tooth/dental problems,Sore throat,  No sneezing, itching, ear ache, nasal congestion, post nasal drip,  Cardio-vascular:  No chest pain, no orthopnea.  Reports no significant change from baseline chronic lower extremity swelling. GI:  No heartburn, indigestion, abdominal pain, nausea, vomiting, diarrhea, change in bowel habits, loss of appetite  Resp:  No using accessory muscles; positive expiratory wheezing appreciated on exam.  Fine crackles at the bases with positive scattered rhonchi. Skin:  no rash or petechiae. GU:  no dysuria, change in color of urine, no urgency or frequency. No flank pain.  Musculoskeletal:  No joint pain or swelling. No decreased range of motion. No back pain.  Psych:  No change in mood or affect. No depression or anxiety.  No memory loss.   Past Medical History:  Diagnosis Date   Anemia, iron deficiency    Negative capsule endoscopy   Arthritis    Coronary atherosclerosis of native coronary artery    60 % circumflex stenosis; EF 65%, Cath 6/13   Diverticula of colon    Pancolonic   DM (diabetes mellitus), type 2, uncontrolled    Dyspnea    Normal cardiopulmonary function test in July 2008, negative echocardiogram with "bubble" study for inter-cardiac shunt.   Dysrhythmia    Essential hypertension, benign    Gastroesophageal reflux disease    Hemorrhoids    Hyperplastic colon polyp 04/06/2007   Hypothyroidism 2003   Status post total thyroidectomy   Memory difficulties    Migraines    Neoplasm of lymphatic and hematopoietic tissue    Paroxysmal atrial fibrillation (HCC)    Diagnosed 2005   Skin cancer    Past Surgical History:  Procedure Laterality Date   BACK SURGERY     BONE MARROW BIOPSY  1990's   CARDIAC CATHETERIZATION     CARDIOVERSION N/A 01/18/2019   Procedure: CARDIOVERSION;  Surgeon: Knox Perl, MD;  Location: [facility] ENDOSCOPY;  Service: Cardiovascular;  Laterality: N/A;   CARDIOVERSION N/A 03/20/2020   Procedure: CARDIOVERSION;  Surgeon: Knox Perl, MD;  Location: [facility] ENDOSCOPY;  Service: Cardiovascular;  Laterality: N/A;   CATARACT EXTRACTION Bilateral    COLONOSCOPY  04/06/2007   Dr. Riley Cheadle- Normal rectum with scattered pancolonic diverticula and slightly redundant elongated colon, diminutive polpy midsigmoid, remainder of colonic mucosa appeared normal. bx= hyperplastic polyp   COLONOSCOPY N/A 11/29/2012   WJX:BJYNWGNF preparation. Friable anal canal/internal hemorrhoids; otherwise, normal rectum. Normal-appearing colonic mucosa   COLONOSCOPY WITH  PROPOFOL  N/A 03/12/2017   Procedure: COLONOSCOPY WITH PROPOFOL ;  Surgeon: Suzette Espy, MD;  Location: [facility] ENDO SUITE;  Service: Endoscopy;  Laterality: N/A;  7:30am   ESOPHAGOGASTRODUODENOSCOPY  04/06/2007   Dr. Riley Cheadle- normal esophagus s/p  nissen fundoplication, intact nissen wrap o/w normal stomach   ESOPHAGOGASTRODUODENOSCOPY N/A 11/29/2012   RMR: Abnormall distal esophagus bx c/w GERD. prior fundoplication. Gastric polyps  -bx benign. Status post biopsy of normal--appearing duodenal mucosa (bx neg)   HEMORRHOID SURGERY N/A 04/06/2017   Procedure: EXTENSIVE HEMORRHOIDECTOMY;  Surgeon: Alanda Allegra, MD;  Location: [facility] ORS;  Service: General;  Laterality: N/A;   LEFT HEART CATHETERIZATION WITH CORONARY ANGIOGRAM N/A 06/27/2011   Procedure: LEFT HEART CATHETERIZATION WITH CORONARY ANGIOGRAM;  Surgeon: Lake Pilgrim, MD;  Location: [facility] CATH LAB;  Service: Cardiovascular;  Laterality: N/A;   NISSEN FUNDOPLICATION     ORIF ANKLE FRACTURE Right 03/19/2021   Procedure: OPEN REDUCTION INTERNAL FIXATION (ORIF) ANKLE FRACTURE, COLATERAL ANKLE LIGAMENT REPAIR;  Surgeon: Saundra Curl, MD;  Location: [facility] ORS;  Service: Orthopedics;  Laterality: Right;   POLYPECTOMY  03/12/2017   Procedure: POLYPECTOMY;  Surgeon: Suzette Espy, MD;  Location: [facility] ENDO SUITE;  Service: Endoscopy;;  cecal x3   POSTERIOR LAMINECTOMY / DECOMPRESSION LUMBAR SPINE  ~ 1990's   SKIN CANCER EXCISION     "off my back and chest; from sun"   Social History:  reports that he has never smoked. He has never used smokeless tobacco. He reports that he does not drink alcohol  and does not use drugs.  No Known Allergies  Family History  Problem Relation Age of Onset   Hypertension Mother    Stroke Mother    Cancer Father    Cancer Sister    Stroke Brother    Cancer Other    Stroke Other    Colon cancer Neg Hx     Prior to Admission medications   Medication Sig Start Date End Date Taking? Authorizing Provider  atorvastatin  (LIPITOR ) 10 MG tablet Take 1 tablet (10 mg total) by mouth daily. 02/02/20  Yes Knox Perl, MD  benazepril  (LOTENSIN ) 20 MG tablet Take 20 mg by mouth 2 (two) times daily. 02/07/22  Yes [provider]  carvedilol  (COREG ) 25 MG tablet Take  1 tablet (25 mg total) by mouth 2 (two) times daily with a meal. 08/06/21 05/15/23 Yes Oral Billings, MD  cholecalciferol  (VITAMIN D3) 25 MCG (1000 UNIT) tablet Take 1,000 Units by mouth daily.   Yes [provider]  citalopram  (CELEXA ) 40 MG tablet Take 40 mg by mouth daily.   Yes [provider]  diltiazem  (CARDIZEM  CD) 240 MG 24 hr capsule Take 240 mg by mouth daily.   Yes [provider]  famotidine  (PEPCID ) 40 MG tablet Take 40 mg by mouth daily. 03/04/20  Yes [provider]  folic acid  (FOLVITE ) 1 MG tablet Take 1 mg by mouth daily.   Yes [provider]  gabapentin  (NEURONTIN ) 300 MG capsule Take 300 mg by mouth 3 (three) times daily.   Yes [provider]  glimepiride  (AMARYL ) 2 MG tablet Take 2 mg by mouth 2 (two) times daily. 03/10/17  Yes [provider]  hydrALAZINE  (APRESOLINE ) 50 MG tablet TAKE 1 TABLET BY MOUTH THREE TIMES DAILY 02/20/23  Yes Knox Perl, MD  Insulin  Degludec (TRESIBA ) 100 UNIT/ML SOLN Inject 14 Units into the skin daily.   Yes [provider]  levocetirizine (XYZAL) 5 MG tablet Take 5 mg by  mouth at bedtime. 02/07/22  Yes [provider]  levothyroxine  (SYNTHROID ) 125 MCG tablet Take 125 mcg by mouth daily at 2 am.   Yes [provider]  LINZESS 290 MCG CAPS capsule Take 290 mcg by mouth daily. 02/07/22  Yes [provider]  Melatonin 10 MG TABS Take 10 mg by mouth at bedtime.   Yes [provider]  metFORMIN  (GLUCOPHAGE ) 500 MG tablet Take 500-1,000 mg by mouth See admin instructions. Take 2 tabs (1,000mg ) by mouth every morning & 1 tab (500mg ) every evening   Yes [provider]  Multiple Vitamins-Minerals (ONE DAILY MULTIVITAMIN MEN) TABS Take by mouth.   Yes [provider]  Multiple Vitamins-Minerals (PRESERVISION AREDS PO) Take by mouth.   Yes [provider]  mupirocin ointment (BACTROBAN) 2 % 1 Application 2 (two) times daily.   Yes  [provider]  pantoprazole  (PROTONIX ) 40 MG tablet Take 40 mg by mouth daily at 12 noon.   Yes [provider]  tamsulosin  (FLOMAX ) 0.4 MG CAPS capsule Take 0.4 mg by mouth daily after breakfast. 09/09/12  Yes [provider]  acetaminophen  (TYLENOL ) 500 MG tablet Take 2 tablets (1,000 mg total) by mouth every 6 (six) hours as needed for mild pain or moderate pain. 03/22/21   Gawne, Meghan M, PA-C  rivaroxaban  (XARELTO ) 20 MG TABS tablet Take 1 tablet (20 mg total) by mouth daily with supper. 08/06/21 03/09/23  Oral Billings, MD   Physical Exam: Vitals:   05/15/23 0846 05/15/23 0920 05/15/23 0930 05/15/23 0935  BP: (!) 191/135 (!) 182/134 (!) 168/126 (!) 168/127  Pulse:    (!) 124  Resp: 20   (!) 27  Temp: 98 F (36.7 C)     SpO2: 100% 100% 99% 98%  Weight: 90.7 kg     Height: 5\' 8"  (1.727 m)       Wt Readings from Last 3 Encounters:  05/15/23 90.7 kg  04/29/23 87.9 kg  03/09/23 87.9 kg    General:  Appears calm and in no major distress; reports feeling slight palpitations but otherwise no acute complaints. Eyes: PERRL, normal lids, and conjunctiva; no jaundice. ENT: grossly normal hearing, lips & tongue. Neck: no LAD, masses or thyromegaly. Cardiovascular: Irregularly irregular; no rubs, no gallops, 1+ edema appreciated bilaterally. Telemetry: Demonstrating A-fib with RVR. Respiratory: Positive expiratory wheezing, no using accessory muscle.  Decreased breath sounds at the bases with positive fine crackles bilaterally. Abdomen: soft, nontender, nondistended, positive bowel sounds. Skin: no rash or induration seen on limited exam Musculoskeletal: grossly normal tone BUE/BLE; 1+ edema appreciated bilaterally. Psychiatric: grossly normal mood and affect, speech fluent and appropriate Neurologic: grossly non-focal.          Labs on Admission:  Basic Metabolic Panel: Recent Labs  Lab 05/14/23 1401  NA 132*  K 3.4*  CL 103  CO2 21*  GLUCOSE 110*   BUN 11  CREATININE 0.87  CALCIUM  9.3   CBC: Recent Labs  Lab 05/14/23 1401  WBC 8.6  NEUTROABS 6.8  HGB 7.7*  HCT 27.5*  MCV 75.3*  PLT 363    CBG: Recent Labs  Lab 05/15/23 0832  GLUCAP 103*    Radiological Exams on Admission: No results found.  EKG: Independently reviewed.  Demonstrating atrial fibrillation.  Assessment/Plan 1-acute blood loss anemia - Hemoglobin down into the 7 range - Continue holding blood thinners and avoid the use of NSAIDs or heparin  products - 2 units PRBCs will be provided - Continue IV  PPI and allow for clear liquid diet. - Follow hemoglobin trend and further transfuse as needed - GI service on board and will follow recommendation for endoscopic evaluation.  2-A. Fib with RVR -In the setting of acute blood loss anemia and holding home antiarrhythmics and rate control agents prior to his anticipated procedures - Will check 2D echo and TSH - Cardizem  drip has been started - Continue adjusted dose of beta-blocker - Replete electrolytes with goal for potassium above 4 and magnesium  above 2 - Continue telemetry monitoring. - Patient blood thinners on hold in the setting of acute blood loss anemia and concern for acute GI bleed.  3-hypertension - Resume home antihypertensive agents except for carvedilol  and Lotensin  in the setting of acute GI bleed and the need of safe margin for antiarrhythmics. - Follow vital signs.  4-BPH - Continue treatment with Flomax  - No complaints of urinary retention currently reported.  5-hypothyroidism - Update TSH - Continue Synthroid   6-type 2 diabetes mellitus with neuropathy - Holding oral hypoglycemic agents - Follow CBG fluctuation - Sliding scale insulin  and Semglee  nightly has been ordered - Continue Neurontin .  7-GERD - PPI twice a day along with nightly Pepcid  has been ordered.  8-chronic constipation - Most likely associated with hypothyroidism - Continue treatment with  Linzess.  9-hyperlipidemia - Continue Lipitor   10-history of COPD - Patient did not use oxygen  supplementation at baseline - Current complaints of shortness of breath most likely associated with A-fib with RVR and underlying anemia - Continue as needed Xopenex (given A-fib with RVR) for any shortness of breath or wheezing - Treatment with Pulmicort and Brovana will be provided.  11-depression/insomnia - Continue treatment with Celexa  and melatonin. - No suicidal ideation or hallucinations currently appreciated.  12-hypokalemia/hypomagnesemia - Replete electrolytes and follow trend.  Code Status: Full code. DVT Prophylaxis: SCDs. Family Communication: None family at bedside. Disposition Plan: Dissipate discharge back home once medically stable; most likely home on 05/18/2023.  Time spent: 75 minutes  Justina Oman [facility] Pager [phone]

## 2023-05-15 NOTE — Progress Notes (Addendum)
 Patient being admitted due to high bp, high hr and low hgb.  5mg  IV Labetolol given at 0920 and 5mg  IV labetolol given at 0930.  Hr and bp remained elevated.  Dr. Riley Cheadle discussed with hospitalist and patient being directly admitted from short stay pre-op to ICU for treatment.  Colonoscopy for today is being canceled with the hopes it will be done as an inpatient in a few days.  Report given to [facility] in ICU.  Patient taken to unit via stretcher.  Patients friend, Thersia Flax is aware of the change in status and is at the bedside.  Thersia Flax was placed in the ICU family room and patient taken to ICU 12.  Informed nurse Dorisann Garre that Thersia Flax was in the family room.  The patient asked if I would tell Thersia Flax to inform Felipa Horsfall about what's going on.  Thersia Flax was told this and she said she would take care of it.

## 2023-05-15 NOTE — Progress Notes (Signed)
 Contacted by Dr. Riley Cheadle and Dr. Margrette Shield with anesthesia.  This is a 89 year old male with history of paroxysmal A-fib maintained on Xarelto , hypothyroidism, previous colon polyps, GERD, HTN, type 2 diabetes, and CAD.  Seen in our clinic in November for follow-up of chronic anemia.  Primary care provider had sent in new referral given worsening anemia.  He denied any active GI symptoms at that time other than long-term chronic constipation and controlled reflux.  FOBT was checked and was positive.  Labs were ordered which revealed iron deficiency and hemoglobin low at 8.8, previously stable in the 10 range.  He was recommended to have an EGD and colonoscopy.  He was originally scheduled for procedure in February however patient was sick therefore family counseled procedure.  He was rescheduled for 4/25.  He had follow-up labs 4/23 and had a critical hemoglobin of 6.9 and was going to arrange blood transfusion for him however patient ended up at [facility] ED.  He did receive 1 unit PRBC with repeat hemoglobin of 8 with plans to possibly proceed with EGD and colonoscopy on 4/25.  Patient had eaten therefore his procedure was rescheduled for today 5/9.  Patient presented this morning for procedure and was found to be in A-fib RVR with heart rate in the 130s.  He presents with some increased shortness of breath.  Hemoglobin yesterday revealed to be 7.7.  On my examination today he appears to have some increased work of breathing however denies any chest pain.  He completed his full bowel prep for colonoscopy and has been having light yellowish liquid with no substance.  He denies any abdominal pain.  Explained plan to patient that he is being admitted given A-fib RVR for stabilization prior to undergoing procedures.   Given possibility for anemia to be worsening his A-fib, we will preemptively give 1 unit PRBC.  He will be on PPI twice daily and will give clear liquid diet.  Spoke with hospitalist about patient's case, he  will be admitted to stepdown unit and be given his metoprolol  and potentially Cardizem  drip if needed.  Hopeful for procedures early next week pending clinical course.  [name], MSN, APRN, FNP-BC, AGACNP-BC [facility]

## 2023-05-16 ENCOUNTER — Inpatient Hospital Stay (HOSPITAL_COMMUNITY)

## 2023-05-16 ENCOUNTER — Other Ambulatory Visit (HOSPITAL_COMMUNITY)

## 2023-05-16 DIAGNOSIS — D62 Acute posthemorrhagic anemia: Secondary | ICD-10-CM | POA: Diagnosis not present

## 2023-05-16 DIAGNOSIS — I480 Paroxysmal atrial fibrillation: Secondary | ICD-10-CM | POA: Diagnosis not present

## 2023-05-16 DIAGNOSIS — I482 Chronic atrial fibrillation, unspecified: Secondary | ICD-10-CM | POA: Diagnosis not present

## 2023-05-16 DIAGNOSIS — J449 Chronic obstructive pulmonary disease, unspecified: Secondary | ICD-10-CM | POA: Diagnosis not present

## 2023-05-16 DIAGNOSIS — F32A Depression, unspecified: Secondary | ICD-10-CM | POA: Diagnosis not present

## 2023-05-16 DIAGNOSIS — K3189 Other diseases of stomach and duodenum: Secondary | ICD-10-CM | POA: Diagnosis not present

## 2023-05-16 DIAGNOSIS — Z794 Long term (current) use of insulin: Secondary | ICD-10-CM | POA: Diagnosis not present

## 2023-05-16 DIAGNOSIS — N400 Enlarged prostate without lower urinary tract symptoms: Secondary | ICD-10-CM | POA: Diagnosis not present

## 2023-05-16 DIAGNOSIS — D132 Benign neoplasm of duodenum: Secondary | ICD-10-CM | POA: Diagnosis not present

## 2023-05-16 DIAGNOSIS — K2990 Gastroduodenitis, unspecified, without bleeding: Secondary | ICD-10-CM | POA: Diagnosis not present

## 2023-05-16 DIAGNOSIS — F05 Delirium due to known physiological condition: Secondary | ICD-10-CM | POA: Diagnosis not present

## 2023-05-16 DIAGNOSIS — R1030 Lower abdominal pain, unspecified: Secondary | ICD-10-CM | POA: Diagnosis not present

## 2023-05-16 DIAGNOSIS — D649 Anemia, unspecified: Secondary | ICD-10-CM | POA: Diagnosis not present

## 2023-05-16 DIAGNOSIS — Z79899 Other long term (current) drug therapy: Secondary | ICD-10-CM | POA: Diagnosis not present

## 2023-05-16 DIAGNOSIS — I4891 Unspecified atrial fibrillation: Secondary | ICD-10-CM

## 2023-05-16 DIAGNOSIS — K649 Unspecified hemorrhoids: Secondary | ICD-10-CM | POA: Diagnosis not present

## 2023-05-16 DIAGNOSIS — K219 Gastro-esophageal reflux disease without esophagitis: Secondary | ICD-10-CM | POA: Diagnosis not present

## 2023-05-16 DIAGNOSIS — E785 Hyperlipidemia, unspecified: Secondary | ICD-10-CM | POA: Diagnosis not present

## 2023-05-16 DIAGNOSIS — K648 Other hemorrhoids: Secondary | ICD-10-CM | POA: Diagnosis not present

## 2023-05-16 DIAGNOSIS — K2971 Gastritis, unspecified, with bleeding: Secondary | ICD-10-CM | POA: Diagnosis not present

## 2023-05-16 DIAGNOSIS — D509 Iron deficiency anemia, unspecified: Secondary | ICD-10-CM | POA: Diagnosis not present

## 2023-05-16 DIAGNOSIS — K5730 Diverticulosis of large intestine without perforation or abscess without bleeding: Secondary | ICD-10-CM | POA: Diagnosis not present

## 2023-05-16 DIAGNOSIS — I10 Essential (primary) hypertension: Secondary | ICD-10-CM | POA: Diagnosis not present

## 2023-05-16 DIAGNOSIS — K2970 Gastritis, unspecified, without bleeding: Secondary | ICD-10-CM | POA: Diagnosis not present

## 2023-05-16 DIAGNOSIS — E890 Postprocedural hypothyroidism: Secondary | ICD-10-CM | POA: Diagnosis not present

## 2023-05-16 DIAGNOSIS — E1140 Type 2 diabetes mellitus with diabetic neuropathy, unspecified: Secondary | ICD-10-CM | POA: Diagnosis not present

## 2023-05-16 DIAGNOSIS — Z79890 Hormone replacement therapy (postmenopausal): Secondary | ICD-10-CM | POA: Diagnosis not present

## 2023-05-16 DIAGNOSIS — E876 Hypokalemia: Secondary | ICD-10-CM | POA: Diagnosis not present

## 2023-05-16 DIAGNOSIS — I2510 Atherosclerotic heart disease of native coronary artery without angina pectoris: Secondary | ICD-10-CM | POA: Diagnosis not present

## 2023-05-16 DIAGNOSIS — Z7901 Long term (current) use of anticoagulants: Secondary | ICD-10-CM | POA: Diagnosis not present

## 2023-05-16 DIAGNOSIS — I119 Hypertensive heart disease without heart failure: Secondary | ICD-10-CM | POA: Diagnosis not present

## 2023-05-16 DIAGNOSIS — R1032 Left lower quadrant pain: Secondary | ICD-10-CM | POA: Diagnosis not present

## 2023-05-16 DIAGNOSIS — K5909 Other constipation: Secondary | ICD-10-CM | POA: Diagnosis not present

## 2023-05-16 DIAGNOSIS — Z6830 Body mass index (BMI) 30.0-30.9, adult: Secondary | ICD-10-CM | POA: Diagnosis not present

## 2023-05-16 DIAGNOSIS — G4700 Insomnia, unspecified: Secondary | ICD-10-CM | POA: Diagnosis not present

## 2023-05-16 DIAGNOSIS — Z7984 Long term (current) use of oral hypoglycemic drugs: Secondary | ICD-10-CM | POA: Diagnosis not present

## 2023-05-16 DIAGNOSIS — E878 Other disorders of electrolyte and fluid balance, not elsewhere classified: Secondary | ICD-10-CM | POA: Diagnosis not present

## 2023-05-16 DIAGNOSIS — K317 Polyp of stomach and duodenum: Secondary | ICD-10-CM | POA: Diagnosis not present

## 2023-05-16 LAB — ECHOCARDIOGRAM COMPLETE
AR max vel: 3.19 cm2
AV Area VTI: 3.22 cm2
AV Area mean vel: 2.79 cm2
AV Mean grad: 2 mmHg
AV Peak grad: 3.3 mmHg
Ao pk vel: 0.91 m/s
Area-P 1/2: 3.74 cm2
Calc EF: 65 %
Height: 68 in
MV VTI: 2.36 cm2
S' Lateral: 3.1 cm
Single Plane A2C EF: 57.9 %
Single Plane A4C EF: 68.3 %
Weight: 3245.17 [oz_av]

## 2023-05-16 LAB — CBC
HCT: 29.9 % — ABNORMAL LOW (ref 39.0–52.0)
Hemoglobin: 9.3 g/dL — ABNORMAL LOW (ref 13.0–17.0)
MCH: 23.5 pg — ABNORMAL LOW (ref 26.0–34.0)
MCHC: 31.1 g/dL (ref 30.0–36.0)
MCV: 75.5 fL — ABNORMAL LOW (ref 80.0–100.0)
Platelets: 301 10*3/uL (ref 150–400)
RBC: 3.96 MIL/uL — ABNORMAL LOW (ref 4.22–5.81)
RDW: 20.8 % — ABNORMAL HIGH (ref 11.5–15.5)
WBC: 8.9 10*3/uL (ref 4.0–10.5)
nRBC: 0 % (ref 0.0–0.2)

## 2023-05-16 LAB — BPAM RBC
Blood Product Expiration Date: 202505252359
Blood Product Expiration Date: 202506092359
ISSUE DATE / TIME: 202505091507
ISSUE DATE / TIME: 202505092211
Unit Type and Rh: 5100
Unit Type and Rh: 5100

## 2023-05-16 LAB — TYPE AND SCREEN
ABO/RH(D): O POS
Antibody Screen: NEGATIVE
Unit division: 0
Unit division: 0

## 2023-05-16 LAB — GLUCOSE, CAPILLARY
Glucose-Capillary: 85 mg/dL (ref 70–99)
Glucose-Capillary: 109 mg/dL — ABNORMAL HIGH (ref 70–99)
Glucose-Capillary: 143 mg/dL — ABNORMAL HIGH (ref 70–99)
Glucose-Capillary: 140 mg/dL — ABNORMAL HIGH (ref 70–99)

## 2023-05-16 LAB — BASIC METABOLIC PANEL WITH GFR
Anion gap: 9 (ref 5–15)
BUN: 6 mg/dL — ABNORMAL LOW (ref 8–23)
CO2: 26 mmol/L (ref 22–32)
Calcium: 8.6 mg/dL — ABNORMAL LOW (ref 8.9–10.3)
Chloride: 105 mmol/L (ref 98–111)
Creatinine, Ser: 0.87 mg/dL (ref 0.61–1.24)
GFR, Estimated: >60 mL/min (ref >60)
Glucose, Bld: 89 mg/dL (ref 70–99)
Potassium: 2.7 mmol/L — CL (ref 3.5–5.1)
Sodium: 140 mmol/L (ref 135–145)

## 2023-05-16 LAB — MAGNESIUM: Magnesium: 1.5 mg/dL — ABNORMAL LOW (ref 1.7–2.4)

## 2023-05-16 MED ORDER — MAGNESIUM SULFATE 2 GM/50ML IV SOLN
2.0000 g | Freq: Once | INTRAVENOUS | Status: AC
  Administered 2023-05-16: 2 g via INTRAVENOUS
  Filled 2023-05-16: qty 50

## 2023-05-16 MED ORDER — POTASSIUM CHLORIDE CRYS ER 20 MEQ PO TBCR
40.0000 meq | EXTENDED_RELEASE_TABLET | ORAL | Status: AC
  Administered 2023-05-16 (×3): 40 meq via ORAL
  Filled 2023-05-16 (×2): qty 2

## 2023-05-16 NOTE — Progress Notes (Signed)
 Messaged Dr. Michaelene Admire of critical potassium level of 2.7

## 2023-05-16 NOTE — Progress Notes (Signed)
   05/16/23 0821  TOC Brief Assessment  Insurance and Status Reviewed  Patient has primary care physician Yes  Home environment has been reviewed Single Family Home  Prior level of function: Independent  Prior/Current Home Services No current home services  Social Drivers of Health Review SDOH reviewed no interventions necessary  Readmission risk has been reviewed Yes  Transition of care needs no transition of care needs at this time   Transition of Care Department [facility] has reviewed patient, and no TOC needs have been identified at this time. We will continue to monitor patient advancement through interdisciplinary progression rounds. If new patient transition needs arise, please place a TOC consult.

## 2023-05-16 NOTE — Plan of Care (Signed)
  Problem: Education: Goal: Ability to describe self-care measures that may prevent or decrease complications (Diabetes Survival Skills Education) will improve Outcome: Progressing Goal: Individualized Educational Video(s) Outcome: Progressing   Problem: Coping: Goal: Ability to adjust to condition or change in health will improve Outcome: Progressing   Problem: Fluid Volume: Goal: Ability to maintain a balanced intake and output will improve Outcome: Progressing   Problem: Health Behavior/Discharge Planning: Goal: Ability to identify and utilize available resources and services will improve Outcome: Progressing Goal: Ability to manage health-related needs will improve Outcome: Progressing   Problem: Metabolic: Goal: Ability to maintain appropriate glucose levels will improve Outcome: Progressing   Problem: Nutritional: Goal: Maintenance of adequate nutrition will improve Outcome: Progressing Goal: Progress toward achieving an optimal weight will improve Outcome: Progressing   Problem: Skin Integrity: Goal: Risk for impaired skin integrity will decrease Outcome: Progressing   Problem: Tissue Perfusion: Goal: Adequacy of tissue perfusion will improve Outcome: Progressing   Problem: Education: Goal: Knowledge of General Education information will improve Description: Including pain rating scale, medication(s)/side effects and non-pharmacologic comfort measures Outcome: Progressing   Problem: Health Behavior/Discharge Planning: Goal: Ability to manage health-related needs will improve Outcome: Progressing   Problem: Clinical Measurements: Goal: Ability to maintain clinical measurements within normal limits will improve Outcome: Progressing Goal: Will remain free from infection Outcome: Progressing Goal: Diagnostic test results will improve Outcome: Progressing Goal: Respiratory complications will improve Outcome: Progressing Goal: Cardiovascular complication will  be avoided Outcome: Progressing   Problem: Activity: Goal: Risk for activity intolerance will decrease Outcome: Progressing   Problem: Coping: Goal: Level of anxiety will decrease Outcome: Progressing   Problem: Nutrition: Goal: Adequate nutrition will be maintained Outcome: Progressing   Problem: Elimination: Goal: Will not experience complications related to bowel motility Outcome: Progressing Goal: Will not experience complications related to urinary retention Outcome: Progressing   Problem: Pain Managment: Goal: General experience of comfort will improve and/or be controlled Outcome: Progressing   Problem: Safety: Goal: Ability to remain free from injury will improve Outcome: Progressing   Problem: Skin Integrity: Goal: Risk for impaired skin integrity will decrease Outcome: Progressing

## 2023-05-16 NOTE — Progress Notes (Signed)
  Echocardiogram 2D Echocardiogram has been performed.  Jawad Wiacek L Haizley Cannella RDCS 05/16/2023, 2:50 PM

## 2023-05-16 NOTE — Progress Notes (Addendum)
 Progress Note   Patient: Brett Matthews WUJ:[id number] DOB: 1932-10-20 DOA: 05/15/2023     0 DOS: the patient was seen and examined on 05/16/2023   Brief hospital admission narrative: Brett Matthews is a 89 y.o. male atrial fibrillation on chronic anticoagulation with Xarelto , GERD, hypertension, type 2 diabetes with neuropathy, BPH, hypothyroidism, COPD, hyperlipidemia and depression; who presented to the hospital for endoscopy and colonoscopy in order to work his anemia up.  Patient was found with a hemoglobin down to 7.0 and the presence of acute atrial fibrillation with RVR.  Patient was also more short of breath than his baseline and unfortunately unstable for the procedures to be done. TRH were consulted to assist with management and resuscitation. Patient reports no chest pain, no dysuria, no hematuria, no abdominal pain, no sick contacts and also expressed no nausea or vomiting.   Patient's magnesium  1.4 and his potassium 3.4  Assessment and plan 1-acute blood loss anemia - After 2 unit PRBCs hemoglobin 9.3 on today's CBC - Continue to follow hemoglobin trend - Continue IV PPI - Follow GI service recommendations and further guidance; with plan for endoscopy and colonoscopy most likely in the next 24-48 hours.  2-A-fib with RVR - TSH within normal limits - Continue the use of beta-blocker and Cardizem  drip - Holding anticoagulation in the setting of GI bleed - Follow electrolytes and replete as needed for goal of fasting above 4 magnesium  above 2.  Continue telemetry monitoring Once rate controlled we will check 2D echo.  3-hypertension - Improved and vital stable - Continue current antihypertensive regimen and follow vital signs.  4-hypothyroidism - TSH within normal limit - Continue Synthroid   5-blood type 2 diabetes with neuropathy - Continue to follow CBG fluctuation and adjust hypoglycemic regimen as needed - Oral agents on hold while inpatient - Continue sliding  scale insulin  and Semglee  - Neurontin  will also be continued.  6-history of BPH - Continue Flomax .  7-history of chronic constipation - Continue treatment with Linzess.  8-hyperlipidemia - Continue statin.  9-history of COPD - No wheezing on today's exam - Continue as needed Xopenex for rescue bronchodilator and continue management with Pulmicort and Brovana while inpatient.  10-depression/insomnia - Will continue treatment with Celexa  and melatonin.  Overall mood is stable.  11-hypokalemia/hypomagnesemia - Continue replating electrolytes and follow trend - Goal is for potassium above 4 and magnesium  above 2  Subjective:  No chest pain, no nausea, no vomiting.  Tolerating clear liquid diet no overt bleeding.  Continue to demonstrate A-fib with RVR blood pressure stabilizing.  Physical Exam: Vitals:   05/16/23 0100 05/16/23 0112 05/16/23 0500 05/16/23 0730  BP: 126/70 109/83    Pulse: 96 99    Resp: (!) 28 (!) 29    Temp:  98.3 F (36.8 C) 98.8 F (37.1 C) 98.2 F (36.8 C)  TempSrc:  Axillary Axillary   SpO2: 95% 96%    Weight:      Height:       General exam: Alert, awake, oriented x 3; no acute distress.  Denies chest pain. Respiratory system: No using accessory muscles; crackles appreciated on exam. Cardiovascular system: Regular irregular; no rubs, no gallops, no JVD. Gastrointestinal system: Abdomen is nondistended, soft and nontender. No organomegaly or masses felt. Normal bowel sounds heard. Central nervous system: Alert and oriented. No focal neurological deficits. Extremities: No cyanosis or clubbing; trace edema appreciated bilaterally. Skin: No petechiae. Psychiatry: Judgement and insight appear normal. Mood & affect appropriate.   Data Reviewed:  Basic metabolic panel: Sodium 140, potassium 2.7, chloride 105, bicarb 26, BUN 6, creatinine 0.87 >60 Magnesium : 1 5 CBC: WBCs 8.9, hemoglobin 9.3, platelet count 301K  DVT prophylaxis: - Continue the use of  SCDs.  Family Communication: No family at bedside.  Disposition: Status is: Observation The patient will require care spanning > 2 midnights and should be moved to inpatient because: Continue IV therapy, electrolyte repletion, follow hemoglobin trend and pursued further recommendation by GI service regarding endoscopic evaluation.  Dissipating discharge home once medically stable.  CRITICAL CARE Performed by: Justina Oman   Total critical care time: 55 minutes  Critical care time was exclusive of separately billable procedures and treating other patients.  Critical care was necessary to treat or prevent imminent or life-threatening deterioration.  Critical care was time spent personally by me on the following activities: development of treatment plan with patient and/or surrogate as well as nursing, discussions with consultants, evaluation of patient's response to treatment, examination of patient, obtaining history from patient or surrogate, ordering and performing treatments and interventions, ordering and review of laboratory studies, ordering and review of radiographic studies, pulse oximetry and re-evaluation of patient's condition.   Author: Justina Oman, MD 05/16/2023 10:58 AM  For on call review [email or web address].

## 2023-05-16 NOTE — Care Management Obs Status (Signed)
 MEDICARE OBSERVATION STATUS NOTIFICATION   Patient Details  Name: GARROD SCALZITTI MRN: [id number] Date of Birth: [birth year]   Medicare Observation Status Notification Given:   Yes.    Lynda Sands, RN 05/16/2023, 7:47 AM

## 2023-05-17 DIAGNOSIS — E878 Other disorders of electrolyte and fluid balance, not elsewhere classified: Secondary | ICD-10-CM

## 2023-05-17 DIAGNOSIS — I482 Chronic atrial fibrillation, unspecified: Secondary | ICD-10-CM

## 2023-05-17 DIAGNOSIS — E876 Hypokalemia: Secondary | ICD-10-CM | POA: Diagnosis not present

## 2023-05-17 DIAGNOSIS — D62 Acute posthemorrhagic anemia: Secondary | ICD-10-CM | POA: Diagnosis not present

## 2023-05-17 LAB — BASIC METABOLIC PANEL WITH GFR
Anion gap: 5 (ref 5–15)
BUN: 5 mg/dL — ABNORMAL LOW (ref 8–23)
CO2: 25 mmol/L (ref 22–32)
Calcium: 8.4 mg/dL — ABNORMAL LOW (ref 8.9–10.3)
Chloride: 101 mmol/L (ref 98–111)
Creatinine, Ser: 0.87 mg/dL (ref 0.61–1.24)
GFR, Estimated: >60 mL/min (ref >60)
Glucose, Bld: 187 mg/dL — ABNORMAL HIGH (ref 70–99)
Potassium: 3.3 mmol/L — ABNORMAL LOW (ref 3.5–5.1)
Sodium: 131 mmol/L — ABNORMAL LOW (ref 135–145)

## 2023-05-17 LAB — GLUCOSE, CAPILLARY
Glucose-Capillary: 123 mg/dL — ABNORMAL HIGH (ref 70–99)
Glucose-Capillary: 169 mg/dL — ABNORMAL HIGH (ref 70–99)
Glucose-Capillary: 205 mg/dL — ABNORMAL HIGH (ref 70–99)
Glucose-Capillary: 53 mg/dL — ABNORMAL LOW (ref 70–99)
Glucose-Capillary: 57 mg/dL — ABNORMAL LOW (ref 70–99)
Glucose-Capillary: 95 mg/dL (ref 70–99)
Glucose-Capillary: 99 mg/dL (ref 70–99)

## 2023-05-17 LAB — MAGNESIUM: Magnesium: 1.8 mg/dL (ref 1.7–2.4)

## 2023-05-17 LAB — CBC
HCT: 28.9 % — ABNORMAL LOW (ref 39.0–52.0)
Hemoglobin: 8.4 g/dL — ABNORMAL LOW (ref 13.0–17.0)
MCH: 22.5 pg — ABNORMAL LOW (ref 26.0–34.0)
MCHC: 29.1 g/dL — ABNORMAL LOW (ref 30.0–36.0)
MCV: 77.3 fL — ABNORMAL LOW (ref 80.0–100.0)
Platelets: 249 10*3/uL (ref 150–400)
RBC: 3.74 MIL/uL — ABNORMAL LOW (ref 4.22–5.81)
RDW: 20.8 % — ABNORMAL HIGH (ref 11.5–15.5)
WBC: 7 10*3/uL (ref 4.0–10.5)
nRBC: 0 % (ref 0.0–0.2)

## 2023-05-17 MED ORDER — POTASSIUM CHLORIDE CRYS ER 20 MEQ PO TBCR
40.0000 meq | EXTENDED_RELEASE_TABLET | ORAL | Status: AC
  Administered 2023-05-17 (×3): 40 meq via ORAL
  Filled 2023-05-17 (×3): qty 2

## 2023-05-17 NOTE — Plan of Care (Signed)

## 2023-05-17 NOTE — Progress Notes (Signed)
 Patient seen along with Dr. Michaelene Admire today.  More alert and oriented than yesterday.  Still has full facemask in place remains on a low-dose Cardizem  drip.  Blood pressure and pulse are better controlled.  Mild electrolyte abnormalities persist.  Anticoagulants have washed out by now.  He presumably has been maintained on a clear liquid diet and did take his prep from last week. We may have a window open tomorrow for EGD and colonoscopy if his progress continues.  We will make him n.p.o. past midnight tonight and reassess tomorrow morning and make a decision for tomorrow as discussed with Dr. Michaelene Admire

## 2023-05-17 NOTE — Progress Notes (Signed)
 Progress Note   Patient: Brett Matthews ZOX:[id number] DOB: July 21, 1932 DOA: 05/15/2023     1 DOS: the patient was seen and examined on 05/17/2023   Brief hospital admission narrative: Brett Matthews is a 89 y.o. male atrial fibrillation on chronic anticoagulation with Xarelto , GERD, hypertension, type 2 diabetes with neuropathy, BPH, hypothyroidism, COPD, hyperlipidemia and depression; who presented to the hospital for endoscopy and colonoscopy in order to work his anemia up.  Patient was found with a hemoglobin down to 7.0 and the presence of acute atrial fibrillation with RVR.  Patient was also more short of breath than his baseline and unfortunately unstable for the procedures to be done. TRH were consulted to assist with management and resuscitation. Patient reports no chest pain, no dysuria, no hematuria, no abdominal pain, no sick contacts and also expressed no nausea or vomiting.   Patient's magnesium  1.4 and his potassium 3.4  Assessment and plan 1-acute blood loss anemia - After 2 unit PRBCs hemoglobin 8.4 on today's CBC - No overt bleeding currently appreciated. - Continue IV PPI - Follow GI service recommendations and further guidance; with plan for endoscopy and colonoscopy most likely in the next 24-48 hours.   2-A-fib with RVR - TSH within normal limits - Continue adjusted dose of beta-blocker and the use of Cardizem  drip - Rate currently controlled and is nebulized - Continue following electrolytes trend and replete and with goal for potassium above 4 and magnesium  above 2 - 2D echo: Preserved ejection fraction (50 to 55%) mild concentric left ventricle hypertrophy, no wall motion abnormalities, no significant valvular disorder and normal systolic function of his right ventricle.   3-hypertension - Improved and vital signs currently stable - Continue current antihypertensive regimen and follow vital signs.   4-hypothyroidism - TSH within normal limit - Continue  Synthroid  at current dose.   5-blood type 2 diabetes with neuropathy - Continue to follow CBG fluctuation and adjust hypoglycemic regimen as needed - Oral agents on hold while inpatient - Continue sliding scale insulin  and Semglee  - Neurontin  will also be continued.   6-history of BPH - Continue Flomax . -No urinary retention complaints reported.   7-history of chronic constipation - Continue treatment with Linzess. -Patient advised to maintain adequate hydration.   8-hyperlipidemia - Continue statin. -Heart healthy/low-fat diet discussed with patient.   9-history of COPD - No wheezing on today's exam - Continue as needed Xopenex for rescue bronchodilator and continue management with Pulmicort and Brovana while inpatient.   10-depression/insomnia - Will continue treatment with Celexa  and melatonin.  Overall mood is stable.   11-hypokalemia/hypomagnesemia - Continue replating electrolytes as needed and follow trend - Goal is for potassium above 4 and magnesium  above 2   12-class I obesity -Body mass index is 30.84 kg/m.  -Low-calorie diet and portion control discussed with patient.  Subjective:  Afebrile, following commands appropriately and expressing no chest pain, no nausea, no vomiting, no palpitations.  All vital breathing easier and not demonstrating signs of overt bleeding.  Physical Exam: Vitals:   05/17/23 0030 05/17/23 0400 05/17/23 0800 05/17/23 0859  BP: (!) 101/57 104/61    Pulse: 64 81    Resp: 14 16    Temp:  97.8 F (36.6 C) 98.1 F (36.7 C)   TempSrc:  Axillary Axillary   SpO2: 98% 96%  100%  Weight:      Height:       General exam: Alert, awake, oriented x 3; in no major distress. Respiratory system: Scattered  rhonchi; no wheezing, no crackles.  No using accessory muscles. Cardiovascular system: Rate controlled, no rubs, no gallops, no JVD. Gastrointestinal system: Abdomen is nondistended, soft and nontender. No organomegaly or masses felt.  Normal bowel sounds heard. Central nervous system: Moving 4 limbs spontaneously.  No focal neurological deficits. Extremities: No cyanosis or clubbing. Skin: No petechiae. Psychiatry: Judgement and insight appear normal.  Affect appreciated on exam.   Data Reviewed: Basic metabolic panel: Sodium 131, potassium 3.3, chloride 101, bicarb 25, BUN 5, creatinine 0.87 and GFR >60 CBC: White blood cell 7.0, platelet count 249K8 and hemoglobin 8.4 Magnesium :1.8  Family Communication: no family at bedside   Disposition: Status is: Inpatient Remains inpatient appropriate because: continue IV therapy  Home once medically stable. Planning for endoscopy evaluation on 05/18/23 (hopefully)  Time spent: 50 minutes  Author: Justina Oman, MD 05/17/2023 11:19 AM  For on call review [email or web address].

## 2023-05-17 NOTE — Progress Notes (Signed)
 Hypoglycemic Event  CBG: 53  Treatment: 8 oz juice/soda  Symptoms: None  Follow-up CBG: Time:2201 CBG Result:53 8 oz of juice and Recheck time: 2223 CBG Result: 57 8 oz of juice given and recheck time: 2247 CBG Result:123  Possible Reasons for Event: Inadequate meal intake  Comments/MD notified:Dr. Opyd notified directed to follow hypoglycemic protocol     [name]

## 2023-05-18 ENCOUNTER — Encounter (HOSPITAL_COMMUNITY): Payer: Self-pay | Admitting: Certified Registered Nurse Anesthetist

## 2023-05-18 ENCOUNTER — Encounter (HOSPITAL_COMMUNITY)
Admission: RE | Disposition: A | Discharge status: Home / Self Care | Payer: Self-pay | Source: Home / Self Care | Attending: Internal Medicine

## 2023-05-18 DIAGNOSIS — D62 Acute posthemorrhagic anemia: Secondary | ICD-10-CM | POA: Diagnosis not present

## 2023-05-18 DIAGNOSIS — E876 Hypokalemia: Secondary | ICD-10-CM | POA: Diagnosis not present

## 2023-05-18 DIAGNOSIS — I482 Chronic atrial fibrillation, unspecified: Secondary | ICD-10-CM | POA: Diagnosis not present

## 2023-05-18 LAB — CBC WITH DIFFERENTIAL/PLATELET
Abs Immature Granulocytes: 0.05 10*3/uL (ref 0.00–0.07)
Basophils Absolute: 0.1 10*3/uL (ref 0.0–0.1)
Basophils Relative: 1 %
Eosinophils Absolute: 0.1 10*3/uL (ref 0.0–0.5)
Eosinophils Relative: 1 %
HCT: 32.3 % — ABNORMAL LOW (ref 39.0–52.0)
Hemoglobin: 9.3 g/dL — ABNORMAL LOW (ref 13.0–17.0)
Immature Granulocytes: 1 %
Lymphocytes Relative: 8 %
Lymphs Abs: 0.8 10*3/uL (ref 0.7–4.0)
MCH: 22.7 pg — ABNORMAL LOW (ref 26.0–34.0)
MCHC: 28.8 g/dL — ABNORMAL LOW (ref 30.0–36.0)
MCV: 78.8 fL — ABNORMAL LOW (ref 80.0–100.0)
Monocytes Absolute: 0.9 10*3/uL (ref 0.1–1.0)
Monocytes Relative: 9 %
Neutro Abs: 8.3 10*3/uL — ABNORMAL HIGH (ref 1.7–7.7)
Neutrophils Relative %: 80 %
Platelets: 297 10*3/uL (ref 150–400)
RBC: 4.1 MIL/uL — ABNORMAL LOW (ref 4.22–5.81)
RDW: 21.4 % — ABNORMAL HIGH (ref 11.5–15.5)
WBC: 10.2 10*3/uL (ref 4.0–10.5)
nRBC: 0 % (ref 0.0–0.2)

## 2023-05-18 LAB — GLUCOSE, CAPILLARY
Glucose-Capillary: 139 mg/dL — ABNORMAL HIGH (ref 70–99)
Glucose-Capillary: 76 mg/dL (ref 70–99)
Glucose-Capillary: 90 mg/dL (ref 70–99)
Glucose-Capillary: 124 mg/dL — ABNORMAL HIGH (ref 70–99)
Glucose-Capillary: 124 mg/dL — ABNORMAL HIGH (ref 70–99)
Glucose-Capillary: 116 mg/dL — ABNORMAL HIGH (ref 70–99)

## 2023-05-18 LAB — BASIC METABOLIC PANEL WITH GFR
Anion gap: 9 (ref 5–15)
BUN: 6 mg/dL — ABNORMAL LOW (ref 8–23)
CO2: 22 mmol/L (ref 22–32)
Calcium: 9.8 mg/dL (ref 8.9–10.3)
Chloride: 102 mmol/L (ref 98–111)
Creatinine, Ser: 0.93 mg/dL (ref 0.61–1.24)
GFR, Estimated: >60 mL/min (ref >60)
Glucose, Bld: 88 mg/dL (ref 70–99)
Potassium: 5.2 mmol/L — ABNORMAL HIGH (ref 3.5–5.1)
Sodium: 133 mmol/L — ABNORMAL LOW (ref 135–145)

## 2023-05-18 SURGERY — COLONOSCOPY
Anesthesia: Choice

## 2023-05-18 MED ORDER — LORAZEPAM 2 MG/ML IJ SOLN
1.0000 mg | Freq: Four times a day (QID) | INTRAMUSCULAR | Status: DC | PRN
  Administered 2023-05-18 – 2023-05-19 (×3): 1 mg via INTRAVENOUS
  Filled 2023-05-18 (×3): qty 1

## 2023-05-18 MED ORDER — BISACODYL 5 MG PO TBEC: 10.0000 mg | DELAYED_RELEASE_TABLET | Freq: Once | ORAL | Status: DC

## 2023-05-18 MED ORDER — HALOPERIDOL LACTATE 5 MG/ML IJ SOLN
1.0000 mg | Freq: Four times a day (QID) | INTRAMUSCULAR | Status: DC | PRN
  Administered 2023-05-18 – 2023-05-19 (×4): 1 mg via INTRAVENOUS
  Filled 2023-05-18 (×4): qty 1

## 2023-05-18 MED ORDER — INSULIN GLARGINE-YFGN 100 UNIT/ML ~~LOC~~ SOLN
5.0000 [IU] | Freq: Every day | SUBCUTANEOUS | Status: DC
  Administered 2023-05-19 – 2023-05-21 (×3): 5 [IU] via SUBCUTANEOUS
  Filled 2023-05-18 (×5): qty 0.05

## 2023-05-18 MED ORDER — POLYETHYLENE GLYCOL 3350 17 G PO PACK
17.0000 g | PACK | ORAL | Status: AC
  Filled 2023-05-18: qty 1

## 2023-05-18 MED ORDER — LORAZEPAM 2 MG/ML IJ SOLN
INTRAMUSCULAR | Status: AC
Start: 2023-05-18 — End: 2023-05-18
  Administered 2023-05-18: 2 mg via INTRAVENOUS
  Filled 2023-05-18: qty 1

## 2023-05-18 MED ORDER — HALOPERIDOL LACTATE 5 MG/ML IJ SOLN
0.5000 mg | Freq: Four times a day (QID) | INTRAMUSCULAR | Status: DC | PRN
  Administered 2023-05-18: 0.5 mg via INTRAVENOUS
  Filled 2023-05-18: qty 1

## 2023-05-18 MED ORDER — LORAZEPAM 2 MG/ML IJ SOLN: 2.0000 mg | Freq: Once | INTRAMUSCULAR | Status: AC

## 2023-05-18 MED ORDER — BISACODYL 5 MG PO TBEC
10.0000 mg | DELAYED_RELEASE_TABLET | Freq: Once | ORAL | Status: AC
  Administered 2023-05-18: 10 mg via ORAL
  Filled 2023-05-18: qty 2

## 2023-05-18 NOTE — Progress Notes (Signed)
 NP Brett Matthews made aware pt not cooperating with care at this time and unable to complete miralax  regimen. Per NP continue to try again later if pt is more appropriate and reevaluate.

## 2023-05-18 NOTE — Progress Notes (Addendum)
 Gastroenterology Progress Note    Patient ID: Brett Matthews; [id number]; Aug 01, 1932    Subjective   Patient denies abdominal pain. No N/V. No overt GI bleeding. Sitting up in chair. Wants to eat when possible. On room air, no respiratory distress. Denies SOB or chest discomfort.    Objective   Vital signs in last 24 hours Temp:  [97.6 F (36.4 C)-98 F (36.7 C)] 97.7 F (36.5 C) (05/11 2300) Pulse Rate:  [38-121] 115 (05/12 0900) Resp:  [12-29] 23 (05/12 0600) BP: (97-130)/(62-87) 130/85 (05/12 0900) SpO2:  [92 %-100 %] 100 % (05/12 0900) Last BM Date : 05/15/23  Physical Exam General:   Alert and oriented, pleasant, sitting up in chair, on room air Lungs: Clear to auscultation bilaterally, no distress, on room air Extremities:  Without clubbing or edema. Neurologic:  Alert and  oriented x4  Intake/Output from previous day: 05/11 0701 - 05/12 0700 In: 240 [P.O.:240] Out: 1300 [Urine:1300] Intake/Output this shift: No intake/output data recorded.  Lab Results  Recent Labs    05/16/23 0533 05/17/23 0607 05/18/23 0903  WBC 8.9 7.0 10.2  HGB 9.3* 8.4* 9.3*  HCT 29.9* 28.9* 32.3*  PLT 301 249 297   BMET Recent Labs    05/16/23 0533 05/17/23 0607 05/18/23 0903  NA 140 131* 133*  K 2.7* 3.3* 5.2*  CL 105 101 102  CO2 26 25 22   GLUCOSE 89 187* 88  BUN 6* 5* 6*  CREATININE 0.87 0.87 0.93  CALCIUM  8.6* 8.4* 9.8   LFT Recent Labs    05/15/23 1209  PROT 5.8*  ALBUMIN  3.3*  AST 33  ALT 43  ALKPHOS 129*  BILITOT 0.9     Studies/Results ECHOCARDIOGRAM COMPLETE Result Date: 05/16/2023    ECHOCARDIOGRAM REPORT   Patient Name:   Brett Matthews Date of Exam: 05/16/2023 Medical Rec #:  [id number]       Height:       68.0 in Accession #:    [id number]      Weight:       202.8 lb Date of Birth:  11-23-1932       BSA:          2.056 m Patient Age:    89 years        BP:           109/83 mmHg Patient Gender: M               HR:           85 bpm. Exam  Location:  [facility] Procedure: 2D Echo, Cardiac Doppler and Color Doppler (Both Spectral and Color            Flow Doppler were utilized during procedure). Indications:    Atrial fibrillation  History:        Patient has prior history of Echocardiogram examinations, most                 recent 02/05/2020. CAD, Arrythmias:Atrial Fibrillation; Risk                 Factors:Diabetes and Dyslipidemia.  Sonographer:    Juanita Shaw Referring Phys: [phone] CARLOS MADERA IMPRESSIONS  1. Left ventricular ejection fraction, by estimation, is 50 to 55%. The left ventricle has low normal function. The left ventricle has no regional wall motion abnormalities. There is mild concentric left ventricular hypertrophy.  2. Right ventricular systolic function is normal. The right ventricular size is normal. There is  mildly elevated pulmonary artery systolic pressure.  3. The mitral valve is normal in structure. No evidence of mitral valve regurgitation. No evidence of mitral stenosis. Moderate mitral annular calcification.  4. Tricuspid valve regurgitation is mild to moderate.  5. The aortic valve is normal in structure. Aortic valve regurgitation is not visualized. No aortic stenosis is present.  6. There is mild dilatation of the aortic root, measuring 39 mm.  7. The inferior vena cava is normal in size with <50% respiratory variability, suggesting right atrial pressure of 8 mmHg. FINDINGS  Left Ventricle: Left ventricular ejection fraction, by estimation, is 50 to 55%. The left ventricle has low normal function. The left ventricle has no regional wall motion abnormalities. The left ventricular internal cavity size was normal in size. There is mild concentric left ventricular hypertrophy. Left ventricular diastolic function could not be evaluated due to mitral annular calcification (moderate or greater). Right Ventricle: The right ventricular size is normal. No increase in right ventricular wall thickness. Right ventricular systolic  function is normal. There is mildly elevated pulmonary artery systolic pressure. The tricuspid regurgitant velocity is 2.76  m/s, and with an assumed right atrial pressure of 8 mmHg, the estimated right ventricular systolic pressure is 38.5 mmHg. Left Atrium: Left atrial size was normal in size. Right Atrium: Right atrial size was normal in size. Pericardium: There is no evidence of pericardial effusion. Mitral Valve: The mitral valve is normal in structure. Moderate mitral annular calcification. No evidence of mitral valve regurgitation. No evidence of mitral valve stenosis. MV peak gradient, 4.0 mmHg. The mean mitral valve gradient is 1.5 mmHg. Tricuspid Valve: The tricuspid valve is normal in structure. Tricuspid valve regurgitation is mild to moderate. No evidence of tricuspid stenosis. Aortic Valve: The aortic valve is normal in structure. Aortic valve regurgitation is not visualized. No aortic stenosis is present. Aortic valve mean gradient measures 2.0 mmHg. Aortic valve peak gradient measures 3.3 mmHg. Aortic valve area, by VTI measures 3.22 cm. Pulmonic Valve: The pulmonic valve was normal in structure. Pulmonic valve regurgitation is not visualized. No evidence of pulmonic stenosis. Aorta: The aortic root is normal in size and structure. There is mild dilatation of the aortic root, measuring 39 mm. Venous: The inferior vena cava is normal in size with less than 50% respiratory variability, suggesting right atrial pressure of 8 mmHg. IAS/Shunts: No atrial level shunt detected by color flow Doppler.  LEFT VENTRICLE PLAX 2D LVIDd:         4.70 cm      Diastology LVIDs:         3.10 cm      LV e' medial:    7.18 cm/s LV PW:         1.10 cm      LV E/e' medial:  10.7 LV IVS:        1.20 cm      LV e' lateral:   10.90 cm/s LVOT diam:     2.20 cm      LV E/e' lateral: 7.1 LV SV:         44 LV SV Index:   22 LVOT Area:     3.80 cm  LV Volumes (MOD) LV vol d, MOD A2C: 104.0 ml LV vol d, MOD A4C: 174.0 ml LV vol  s, MOD A2C: 43.8 ml LV vol s, MOD A4C: 55.2 ml LV SV MOD A2C:     60.2 ml LV SV MOD A4C:     174.0 ml LV  SV MOD BP:      93.0 ml RIGHT VENTRICLE             IVC RV Basal diam:  4.00 cm     IVC diam: 2.40 cm RV Mid diam:    2.70 cm RV S prime:     10.30 cm/s TAPSE (M-mode): 1.3 cm LEFT ATRIUM             Index        RIGHT ATRIUM           Index LA diam:        4.10 cm 1.99 cm/m   RA Area:     22.30 cm LA Vol (A2C):   26.7 ml 12.99 ml/m  RA Volume:   61.30 ml  29.82 ml/m LA Vol (A4C):   77.6 ml 37.74 ml/m LA Biplane Vol: 49.2 ml 23.93 ml/m  AORTIC VALVE                    PULMONIC VALVE AV Area (Vmax):    3.19 cm     PV Vmax:          0.47 m/s AV Area (Vmean):   2.79 cm     PV Peak grad:     0.9 mmHg AV Area (VTI):     3.22 cm     PR End Diast Vel: 4.84 msec AV Vmax:           90.87 cm/s AV Vmean:          60.033 cm/s AV VTI:            0.138 m AV Peak Grad:      3.3 mmHg AV Mean Grad:      2.0 mmHg LVOT Vmax:         76.30 cm/s LVOT Vmean:        44.100 cm/s LVOT VTI:          0.117 m LVOT/AV VTI ratio: 0.85  AORTA Ao Root diam: 3.90 cm Ao Asc diam:  3.10 cm MITRAL VALVE               TRICUSPID VALVE MV Area (PHT): 3.74 cm    TR Peak grad:   30.5 mmHg MV Area VTI:   2.36 cm    TR Vmax:        276.00 cm/s MV Peak grad:  4.0 mmHg MV Mean grad:  1.5 mmHg    SHUNTS MV Vmax:       1.00 m/s    Systemic VTI:  0.12 m MV Vmean:      57.6 cm/s   Systemic Diam: 2.20 cm MV Decel Time: 203 msec MV E velocity: 77.10 cm/s Kardie Tobb DO Electronically signed by Jerryl Morin DO Signature Date/Time: 05/16/2023/3:06:39 PM    Final     Assessment  89 y.o. male with a history of afib on Xarelto , GERD, HTN, DM, BPH, hypothyroidism, COPD, hyperlipidemia, depression, history of anemia and presented to hospital last week for outpatient EGD and colonoscopy but was found to be in afib RVR, acute on chronic anemia, and admitted for further management.   Acute blood loss anemia: with history of IDA and seen as outpatient Nov  2024 with plans for procedures. Hgb down to 6.9 as outpatient and evaluated at [facility] in [place] in April 2025, receiving 1 unit PRBCs and then presented to [facility] for procedure on 5/9 with Hgb 7.7. Received 2 units PRBCs. Hgb improved  appropriately and 9.3 this morning. No overt GI bleeding. Xarelto  has been on hold. Hx of adenomas in 2019.   He has already received bowel prep prior to admission and remained on clear liquids. Colonoscopy/EGD had been on hold this admission until stabilized from cardiopulmonary standpoint. He is now on room air. Remains on Cardizem  drip but HR in low 1teens, BP stable.   Repeat BMP with potassium 5.2. discussed with anesthesia and no concerns.   Plan / Recommendations  Colonoscopy/EGD by Dr. Riley Cheadle today. RIsks and benefits discussed with patient. Continue daily Linzess dosing. Added one dose of dulcolax Tap water  enema this morning Procedures later this afternoon.  Continue PPI BID    LOS: 2 days    05/18/2023, 9:33 AM  Delman Ferns, PhD, ANP-BC [facility]    ADDENDUM: due to patient's agitation, combative behavior, procedures will be postponed to tomorrow. He was given clear liquid diet. Documented in depth in progress note. Significant other desires patient to pursue the procedures as inpatient.  Delman Ferns, PhD, ANP-BC [facility]  2:26 PM

## 2023-05-18 NOTE — Progress Notes (Signed)
   05/18/23 1200  Spiritual Encounters  Type of Visit Initial  Care provided to: [facility] partners present during encounter Nurse  Reason for visit Trauma  OnCall Visit No   Chaplain met with the patient's significant other, Evertt Hoe, who was troubled because of the distressed the patient is experiencing. She explained that the patient, Brett Matthews expected to have a procedure today and due to his distress the procedure could not be done today. The Nurse Practitioner explained that it would be dangerous for [facility] to undergo the procedure. Brett Matthews is paginated because of this because he has been waiting for several days and is quite hungry. Evertt Hoe said he would be fine if he could just eat and is concerned with the putting off of the procedure for another day.  Team is assessing options and Evertt Hoe was relived for this.  Paperwork she needed to bring she had and the attending nurse is adding the information to the patient's file.   Clarence Croak [facility]  [phone]

## 2023-05-18 NOTE — Plan of Care (Signed)
  Problem: Metabolic: Goal: Ability to maintain appropriate glucose levels will improve Outcome: Progressing   Problem: Nutritional: Goal: Maintenance of adequate nutrition will improve Outcome: Progressing Goal: Progress toward achieving an optimal weight will improve Outcome: Progressing   Problem: Tissue Perfusion: Goal: Adequacy of tissue perfusion will improve Outcome: Progressing   Problem: Clinical Measurements: Goal: Ability to maintain clinical measurements within normal limits will improve Outcome: Progressing Goal: Will remain free from infection Outcome: Progressing   Problem: Skin Integrity: Goal: Risk for impaired skin integrity will decrease Outcome: Not Progressing   Problem: Education: Goal: Knowledge of General Education information will improve Description: Including pain rating scale, medication(s)/side effects and non-pharmacologic comfort measures Outcome: Not Progressing   Problem: Coping: Goal: Level of anxiety will decrease Outcome: Not Progressing   Problem: Safety: Goal: Ability to remain free from injury will improve Outcome: Not Progressing   Problem: Skin Integrity: Goal: Risk for impaired skin integrity will decrease Outcome: Not Progressing

## 2023-05-18 NOTE — Progress Notes (Signed)
 Around 0900 this AM pt noted to be very restless and fidgety, noted to slide out of bottom of chair into floor at this time, landing on sacrum. Patient not injured and all IV's remained intact but pt continuing to present with some confusion and very restless. MD Michaelene Admire made aware.Sitter ordered and provided. Around 1120 Sitter notified RN of increased agitation from patient and attempt to get out of chair. 1130 MD Michaelene Admire made aware of pts increasing agitation, patient at this time punching and kicking staff. Staff responding to bedside and attempting to place pt back in bed, leading to more agitation.  IV Haldol ordered and MD Michaelene Admire made aware pt had to receive IM due to loss of PIVs. Patient continued to increase in agitation after haldol and noted to have bilateral arm skin tears at this time. MD Michaelene Admire paged to bedside.   Currently at this time pt in bue wrist restraints after 2mg  IV ativan  administered and sitter at bedside. PIV replaced and cardizem  restarted around 1245.

## 2023-05-18 NOTE — Progress Notes (Signed)
 Progress Note   Patient: Brett Matthews ZOX:[id number] DOB: 1932/04/03 DOA: 05/15/2023     2 DOS: the patient was seen and examined on 05/18/2023   Brief hospital admission narrative: DRAELYN SERVICE is a 89 y.o. male atrial fibrillation on chronic anticoagulation with Xarelto , GERD, hypertension, type 2 diabetes with neuropathy, BPH, hypothyroidism, COPD, hyperlipidemia and depression; who presented to the hospital for endoscopy and colonoscopy in order to work his anemia up.  Patient was found with a hemoglobin down to 7.0 and the presence of acute atrial fibrillation with RVR.  Patient was also more short of breath than his baseline and unfortunately unstable for the procedures to be done. TRH were consulted to assist with management and resuscitation. Patient reports no chest pain, no dysuria, no hematuria, no abdominal pain, no sick contacts and also expressed no nausea or vomiting.   Patient's magnesium  1.4 and his potassium 3.4  Assessment and plan 1-acute blood loss anemia - After 2 unit PRBCs hemoglobin 8.4 on today's CBC - No overt bleeding currently appreciated. - Continue IV PPI - Follow GI service recommendations and further guidance; with plan for endoscopy and colonoscopy most likely in the next 24-48 hours. -Patient's initial procedure plan for today has been canceled in the setting of lack of cooperation and ongoing agitation.   2-A-fib with RVR - TSH within normal limits - We will continue adjusted dose of beta-blocker and the use of Cardizem  drip - Rate currently controlled and is nebulized - Continue following electrolytes trend and replete and with goal for potassium above 4 and magnesium  above 2 - 2D echo: Preserved ejection fraction (50 to 55%) mild concentric left ventricle hypertrophy, no wall motion abnormalities, no significant valvular disorder and normal systolic function of his right ventricle.   3-hypertension - Improved and vital signs currently stable -  Continue current antihypertensive regimen and follow vital signs.   4-hypothyroidism - TSH within normal limit - Continue Synthroid  at current dose.   5-blood type 2 diabetes with neuropathy - Continue to follow CBG fluctuation and adjust hypoglycemic regimen as needed - Oral agents on hold while inpatient - Continue sliding scale insulin  and Semglee  - Neurontin  will also be continued.   6-history of BPH - Continue Flomax . - No complaints of retention.   7-history of chronic constipation - Continue treatment with Linzess. -Patient advised to maintain adequate hydration.   8-hyperlipidemia - Continue statin. -Heart healthy/low-fat diet discussed with patient.   9-history of COPD - Very little expiratory wheezing appreciated on exam; not using accessory muscles or demonstrating frank exacerbation. - Continue as needed Xopenex for rescue bronchodilator and continue management with Pulmicort and Brovana while inpatient.   10-depression/insomnia - Will continue treatment with Celexa  and melatonin.  Overall mood is stable.   11-hypokalemia/hypomagnesemia - Repleted - Continue to follow trend - Goal is for potassium above 4 and magnesium  above 2 as much as possible. - Continue telemetry monitoring.   12-class I obesity -Body mass index is 30.84 kg/m.  -Low-calorie diet and portion control discussed with patient.  13-sundowning - As needed Haldol has been provided and one-time dose of Ativan  also given - Patient to receive constant reorientation and hopefully restraints will be able to be removed for his own safety and nursing staff safety. - Sitter at bedside has been ordered.  Subjective:  Afebrile, no chest pain, no abdominal pain.  No overt bleeding reported.  Anxious, agitated, restless, having difficulty following commands.  4 point restraints in place.  Physical Exam:  Vitals:   05/18/23 0900 05/18/23 1100 05/18/23 1200 05/18/23 1300  BP: 130/85 (!) 131/103 (!)  144/105 119/86  Pulse: (!) 115 (!) 112 (!) 101 (!) 118  Resp:  (!) 26  (!) 23  Temp:      TempSrc:      SpO2: 100% 96% 97% 94%  Weight:      Height:       General exam: Alert, awake, oriented x 2, agitated, anxious and having trouble following commands.  At time of examination requiring 4 point restraints abdomen staff reports has been kicking, punching and pulling leads/IVs. Respiratory system: Good saturation on room air appreciated.  Positive scattered rhonchi and minimal expiratory wheezing appreciated on exam. Cardiovascular system: Irregular, no rubs, no gallops, no JVD. Gastrointestinal system: Abdomen is nondistended, soft and nontender. No organomegaly or masses felt. Normal bowel sounds heard. Central nervous system: Moves 4 limbs spontaneously.  No focal neurological deficits. Extremities: No cyanosis or clubbing; trace edema appreciated bilaterally. Skin: No.. Psychiatry: Restless, anxious and agitated.  Data Reviewed: CBC: White blood cells 10.2, hemoglobin 9.3 and platelet count 297K Basic metabolic panel: Sodium 133, potassium 5.2, chloride 102, bicarb 22, BUN 6, creatinine 0.93 and GFR >60 Magnesium :1.8  Family Communication: Care following updated at bedside   Disposition: Status is: Inpatient Remains inpatient appropriate because: continue IV therapy  Home once medically stable. Planning for endoscopy evaluation on 05/19/23 (hopefully)  Time spent: 50 minutes  Author: Justina Oman, MD 05/18/2023 2:46 PM  For on call review [email or web address].

## 2023-05-18 NOTE — Inpatient Diabetes Management (Signed)
 Inpatient Diabetes Program Recommendations  AACE/ADA: New Consensus Statement on Inpatient Glycemic Control (2015)  Target Ranges:  Prepandial:   less than 140 mg/dL      Peak postprandial:   less than 180 mg/dL (1-2 hours)      Critically ill patients:  140 - 180 mg/dL   Lab Results  Component Value Date   GLUCAP 76 05/18/2023   HGBA1C 6.3 (H) 05/15/2023    Latest Reference Range & Units 05/17/23 07:51 05/17/23 11:43 05/17/23 16:20 05/17/23 22:01 05/17/23 22:23 05/17/23 22:47 05/17/23 23:52 05/18/23 01:04 05/18/23 07:25  Glucose-Capillary 70 - 99 mg/dL 99 562 (H) 130 (H) 53 (L) 57 (L) 123 (H) 95 139 (H) 76  (H): Data is abnormally high (L): Data is abnormally low  Diabetes history: DM2 Outpatient Diabetes medications: Amaryl  2 mg daily Current orders for Inpatient glycemic control: Semglee  8 units daily, Novolog  0-9 units tid, 0-5 units hs  Inpatient Diabetes Program Recommendations:   Noted patient had hypoglycemia  post Novolog  correction and fasting is 76. Please consider: -Decrease Semglee  to 5 units daily -Decrease Novolog  correction to 0-6 units tid (q 4 hrs. If continues with NPO) A1c is 6.3 so may not need Amaryl  on discharge.  Thank you, Brett Matthews E. Faige Seely, RN, MSN, CDCES  Diabetes Coordinator Inpatient Glycemic Control Team Team Pager [phone] (8am-5pm) 05/18/2023 9:41 AM

## 2023-05-18 NOTE — Progress Notes (Addendum)
 Due to agitation, will hold off on procedures today. Will advance diet. WIll arrange outpatient visit as soon as possible to expedite outpatient procedures.  Delman Ferns, PhD, ANP-BC [facility]    1200: discussed with significant other. Chaplain and nursing supervisor present. She desires patient to have colonoscopy while inpatient. She would like to try for tomorrow. WIll d/c soft diet. Start clear liquids. She is requesting that his procedure be as soon as possible in the morning. I discussed we can't guarantee this, but I would look at the schedule.

## 2023-05-18 NOTE — Plan of Care (Signed)
  Problem: Coping: Goal: Ability to adjust to condition or change in health will improve Outcome: Progressing   Problem: Pain Managment: Goal: General experience of comfort will improve and/or be controlled Outcome: Progressing   Problem: Safety: Goal: Ability to remain free from injury will improve Outcome: Progressing   Problem: Skin Integrity: Goal: Risk for impaired skin integrity will decrease Outcome: Progressing

## 2023-05-19 ENCOUNTER — Other Ambulatory Visit: Payer: Self-pay | Admitting: Cardiology

## 2023-05-19 ENCOUNTER — Other Ambulatory Visit: Payer: Self-pay

## 2023-05-19 ENCOUNTER — Encounter (HOSPITAL_COMMUNITY)
Admission: RE | Disposition: A | Discharge status: Home / Self Care | Payer: Self-pay | Source: Home / Self Care | Attending: Internal Medicine

## 2023-05-19 ENCOUNTER — Telehealth (INDEPENDENT_AMBULATORY_CARE_PROVIDER_SITE_OTHER): Payer: Self-pay | Admitting: *Deleted

## 2023-05-19 ENCOUNTER — Inpatient Hospital Stay (HOSPITAL_COMMUNITY)

## 2023-05-19 DIAGNOSIS — E876 Hypokalemia: Secondary | ICD-10-CM | POA: Diagnosis not present

## 2023-05-19 DIAGNOSIS — R1030 Lower abdominal pain, unspecified: Secondary | ICD-10-CM | POA: Diagnosis not present

## 2023-05-19 DIAGNOSIS — I482 Chronic atrial fibrillation, unspecified: Secondary | ICD-10-CM | POA: Diagnosis not present

## 2023-05-19 DIAGNOSIS — D509 Iron deficiency anemia, unspecified: Secondary | ICD-10-CM

## 2023-05-19 DIAGNOSIS — I10 Essential (primary) hypertension: Secondary | ICD-10-CM

## 2023-05-19 DIAGNOSIS — D62 Acute posthemorrhagic anemia: Secondary | ICD-10-CM | POA: Diagnosis not present

## 2023-05-19 LAB — BASIC METABOLIC PANEL WITH GFR
Anion gap: 7 (ref 5–15)
BUN: 6 mg/dL — ABNORMAL LOW (ref 8–23)
CO2: 24 mmol/L (ref 22–32)
Calcium: 9.5 mg/dL (ref 8.9–10.3)
Chloride: 106 mmol/L (ref 98–111)
Creatinine, Ser: 1.03 mg/dL (ref 0.61–1.24)
GFR, Estimated: >60 mL/min (ref >60)
Glucose, Bld: 96 mg/dL (ref 70–99)
Potassium: 4.4 mmol/L (ref 3.5–5.1)
Sodium: 137 mmol/L (ref 135–145)

## 2023-05-19 LAB — GLUCOSE, CAPILLARY
Glucose-Capillary: 112 mg/dL — ABNORMAL HIGH (ref 70–99)
Glucose-Capillary: 111 mg/dL — ABNORMAL HIGH (ref 70–99)
Glucose-Capillary: 117 mg/dL — ABNORMAL HIGH (ref 70–99)
Glucose-Capillary: 108 mg/dL — ABNORMAL HIGH (ref 70–99)

## 2023-05-19 LAB — BLOOD GAS, ARTERIAL
Acid-Base Excess: 2 mmol/L (ref 0.0–2.0)
Bicarbonate: 26.5 mmol/L (ref 20.0–28.0)
Drawn by: 37588
O2 Saturation: 98.6 %
Patient temperature: 36.4
pCO2 arterial: 39 mmHg (ref 32–48)
pH, Arterial: 7.44 (ref 7.35–7.45)
pO2, Arterial: 93 mmHg (ref 83–108)

## 2023-05-19 LAB — CBC
HCT: 30.6 % — ABNORMAL LOW (ref 39.0–52.0)
Hemoglobin: 9.3 g/dL — ABNORMAL LOW (ref 13.0–17.0)
MCH: 23.5 pg — ABNORMAL LOW (ref 26.0–34.0)
MCHC: 30.4 g/dL (ref 30.0–36.0)
MCV: 77.3 fL — ABNORMAL LOW (ref 80.0–100.0)
Platelets: 257 10*3/uL (ref 150–400)
RBC: 3.96 MIL/uL — ABNORMAL LOW (ref 4.22–5.81)
RDW: 22.1 % — ABNORMAL HIGH (ref 11.5–15.5)
WBC: 9.5 10*3/uL (ref 4.0–10.5)
nRBC: 0 % (ref 0.0–0.2)

## 2023-05-19 SURGERY — COLONOSCOPY
Anesthesia: Choice

## 2023-05-19 MED ORDER — IOHEXOL 300 MG/ML  SOLN
100.0000 mL | Freq: Once | INTRAMUSCULAR | Status: AC | PRN
  Administered 2023-05-19: 100 mL via INTRAVENOUS

## 2023-05-19 MED ORDER — HYDRALAZINE HCL 20 MG/ML IJ SOLN
10.0000 mg | Freq: Three times a day (TID) | INTRAMUSCULAR | Status: DC | PRN
  Administered 2023-05-19: 10 mg via INTRAVENOUS
  Filled 2023-05-19: qty 1

## 2023-05-19 MED ORDER — LORAZEPAM 2 MG/ML IJ SOLN
1.0000 mg | Freq: Once | INTRAMUSCULAR | Status: AC
  Administered 2023-05-19: 1 mg via INTRAVENOUS
  Filled 2023-05-19: qty 1

## 2023-05-19 NOTE — Progress Notes (Signed)
 Dorisann Garre RN released his wrist restraints. Patient is asleep and resting comfortably.

## 2023-05-19 NOTE — H&P (View-Only) (Signed)
 Subjective: Unable to obtain history from patient. He is agitated and constantly moaning. Asked about abdominal pain and he says yes and points to lower abdomen. No reports of nausea, vomiting, brbpr, melena per nursing staff. Nurse states she did wipe dried blood from mouth this morning and is not sure where that came from.   Objective: Vital signs in last 24 hours: Temp:  [97.2 F (36.2 C)-98.3 F (36.8 C)] 98.3 F (36.8 C) (05/13 0751) Pulse Rate:  [60-127] 111 (05/13 0900) Resp:  [16-30] 18 (05/13 0900) BP: (119-189)/(63-117) 167/92 (05/13 0900) SpO2:  [85 %-99 %] 92 % (05/13 0900) Last BM Date :  (UTA) General:   Alert but not oriented, constantly moaning.  Abdomen:  Bowel sounds present, soft, non-distended. Mild generalized TTP, but seems to be greatest in lower abdomen. There is a mass like area in his LLQ. Not sure if this is chronic.  No rebound or guarding.  Msk:  Symmetrical without gross deformities. Normal posture. Pulses:  Normal pulses noted. Extremities:  Without edema. Skin:  Warm and dry, intact without significant lesions.  Psych:   Normal mood and affect.  Intake/Output from previous day: 05/12 0701 - 05/13 0700 In: 814.4 [I.V.:814.4] Out: 1400 [Urine:1400] Intake/Output this shift: No intake/output data recorded.  Lab Results: Recent Labs    05/17/23 0607 05/18/23 0903  WBC 7.0 10.2  HGB 8.4* 9.3*  HCT 28.9* 32.3*  PLT 249 297   BMET Recent Labs    05/17/23 0607 05/18/23 0903  NA 131* 133*  K 3.3* 5.2*  CL 101 102  CO2 25 22  GLUCOSE 187* 88  BUN 5* 6*  CREATININE 0.87 0.93  CALCIUM  8.4* 9.8    Assessment: 89 y.o. male with a history of afib on Xarelto , GERD, HTN, DM, BPH, hypothyroidism, COPD, hyperlipidemia, depression, history of anemia and presented to hospital last week for outpatient EGD and colonoscopy but was found to be in afib RVR, acute on chronic anemia, and admitted for further management.   Acute on chronic anemia:   With history of IDA and seen as outpatient Nov 2024 with plans for procedures. Hgb down to 6.9 as outpatient and evaluated at [facility] in [place] in April 2025, receiving 1 unit PRBCs and then presented to [facility] for procedure on 5/9 with Hgb 7.7. Received 2 units PRBCs. Hgb improved appropriately, 9.3 yesterday. No reports of overt GI bleeding prior to admission and non so far this admission; however, nurse does report wiping dried blood from patient's lips this morning from unknown source.   Had planned for EGD and colonoscopy today, but due to main water  break, hospital is without water  and all procedures have been canceled. Will recheck Hgb today to ensure stability and discuss timing of EGD/colonoscopy with Dr. Alita Irwin. Considering he needs Xarelto  for A fib, ideally would like to pursue procedures prior to discharge. Could consider postponing to see if water  is restored in the next 24-48 hours and if patient's is cooperative enough for procedures (sundowning leaning to agitation and combativeness) vs transfer to different facility.   Of note, he had already received bowel prep prior to admission and has remained on clear liquids. Recommended MiraLAX  prep yesterday, but due to agitation requiring haldol, he was not able to complete this and remains agitated this morning, not taking anything by mouth.    Abdominal pain:  Patient reporting lower abdominal pain but unable to obtain any reliable history due to agitation and haldol on board. Has mild  generalized abdominal tenderness, but seems most present in the lower abdomen where there is a mass likely lesion in the LLQ. No abdominal imaging this admission.  Will order CT for further evaluation.   A fib with RVR:  - Rate currently controlled. Remains on Cardizem  drip and metoprolol  injection. Management per hospitalist.   Plan: CT abdomen and pelvis with IV contrast ASAP Continue Protonix  40 mg BID Needs EGD and colonoscopy to evaluate acute on  chronic anemia. Will discuss timing with Dr. Alita Irwin.  Continue to monitor H/H and for overt GI bleeding.    LOS: 3 days    05/19/2023, 10:28 AM   Shana Daring, PA-C [facility]

## 2023-05-19 NOTE — Progress Notes (Signed)
 Progress Note   Patient: Brett Matthews VHQ:[id number] DOB: 18-Nov-1932 DOA: 05/15/2023     3 DOS: the patient was seen and examined on 05/19/2023   Brief hospital admission narrative: Brett Matthews is a 89 y.o. male atrial fibrillation on chronic anticoagulation with Xarelto , GERD, hypertension, type 2 diabetes with neuropathy, BPH, hypothyroidism, COPD, hyperlipidemia and depression; who presented to the hospital for endoscopy and colonoscopy in order to work his anemia up.  Patient was found with a hemoglobin down to 7.0 and the presence of acute atrial fibrillation with RVR.  Patient was also more short of breath than his baseline and unfortunately unstable for the procedures to be done. TRH were consulted to assist with management and resuscitation. Patient reports no chest pain, no dysuria, no hematuria, no abdominal pain, no sick contacts and also expressed no nausea or vomiting.   Patient's magnesium  1.4 and his potassium 3.4  Assessment and plan 1-acute blood loss anemia - After 2 unit PRBCs hemoglobin 9.3 on today's CBC - No overt bleeding currently appreciated. - Continue IV PPI and continue holding anticoagulation therapy on blood thinners. - Follow GI service recommendations and further guidance; with plan for endoscopy and colonoscopy most likely in the next 24-48 hours. - Will follow GI service further recommendations   2-A-fib with RVR - TSH within normal limits - We will continue adjusted dose of beta-blocker and the use of Cardizem  drip - Rate currently controlled and is nebulized - Continue following electrolytes trend and replete and with goal for potassium above 4 and magnesium  above 2 - 2D echo: Preserved ejection fraction (50 to 55%) mild concentric left ventricle hypertrophy, no wall motion abnormalities, no significant valvular disorder and normal systolic function of his right ventricle.   3-hypertension - Improved and vital signs currently stable - Continue  current antihypertensive regimen and follow vital signs.   4-hypothyroidism - TSH within normal limit - Continue Synthroid  at current dose.   5-blood type 2 diabetes with neuropathy - Continue to follow CBG fluctuation and adjust hypoglycemic regimen as needed - Oral agents on hold while inpatient - Continue sliding scale insulin  and Semglee  - Neurontin  will also be continued.   6-history of BPH - Continue Flomax . - No complaints of retention.   7-history of chronic constipation - Continue treatment with Linzess. -Patient advised to maintain adequate hydration.   8-hyperlipidemia - Continue statin. -Heart healthy/low-fat diet discussed with patient.   9-history of COPD - Very little expiratory wheezing appreciated on exam; not using accessory muscles or demonstrating frank exacerbation. - Continue as needed Xopenex for rescue bronchodilator and continue management with Pulmicort and Brovana while inpatient. -ABG demonstrating a stable oxygen  saturation and CO2 level.   10-depression/insomnia - Will continue treatment with Celexa  and melatonin.  Overall mood is stable.   11-hypokalemia/hypomagnesemia - Repleted and currently stable. - Continue to follow trend intermittently - Goal is for potassium above 4 and magnesium  above 2 as much as possible. - Continue telemetry monitoring.   12-class I obesity -Body mass index is 30.84 kg/m.  -Low-calorie diet and portion control discussed with patient.  13-sundowning - I's needed Haldol and lorazepam  has been ordered - Continue constant reorientation - Sitter at bedside for safety; patient at high risk for falls   Subjective:  No fever, no chest pain, no nausea, no vomiting, no upper bleeding reported.  Overall calm air on today's exam in comparison to 05/18/2023.  Still experiencing intermittent episode of agitation/restlessness and pulling at his telemetry and IV  lines.  Physical Exam: Vitals:   05/19/23 1930 05/19/23  1940 05/19/23 2030 05/19/23 2100  BP: (!) 159/90  (!) 141/91   Pulse: (!) 115 (!) 117 (!) 118 (!) 133  Resp: 18 20 (!) 22 (!) 21  Temp: 97.9 F (36.6 C)     TempSrc: Oral     SpO2: 93% 95% 96% 94%  Weight:      Height:       General exam: Alert, awake, oriented x 2 intermittently, able to follow simple commands and per nursing report still intermittently restless and agitated. (But overall improved in comparison to 05/18/2023) Respiratory system: 2-3 L nasal cannula in place; no chest pain, no using accessory muscles.  Mild expiratory wheezing and scattered rhonchi appreciated on exam. Cardiovascular system: Irregular, no rubs, no gallops, no JVD. Gastrointestinal system: Abdomen is nondistended, soft and without any guarding.  Positive bowel sounds appreciated Central nervous system: Moving 4 limbs spontaneously.  No focal neurological deficits. Extremities: No cyanosis or clubbing; trace edema appreciated bilaterally.  Mittens in place. Skin: No petechiae. Psychiatry: Judgement and insight appear currently impaired in the setting of sundowning; experiencing intermittent episode of agitation and restlessness.  Data Reviewed: Basic metabolic panel: Sodium 137, potassium 4.4, chloride 106, bicarb 24, BUN 6, creatinine 1.03 and GFR >60 - CBC: WBCs 9.5, hemoglobin 9.3 and platelet count 257K Magnesium :1.8  Family Communication: Care following updated at bedside   Disposition: Status is: Inpatient Remains inpatient appropriate because: continue IV therapy  Home once medically stable. Planning for endoscopy evaluation on 5/14 or 5/15 pending water  supply problem corrected inside the hospital.  Time spent: 50 minutes  Author: Justina Oman, MD 05/19/2023 9:29 PM  For on call review [email or web address].

## 2023-05-19 NOTE — Plan of Care (Signed)
  Problem: Education: Goal: Ability to describe self-care measures that may prevent or decrease complications (Diabetes Survival Skills Education) will improve Outcome: Not Progressing Goal: Individualized Educational Video(s) Outcome: Not Progressing   Problem: Coping: Goal: Ability to adjust to condition or change in health will improve Outcome: Not Progressing   Problem: Fluid Volume: Goal: Ability to maintain a balanced intake and output will improve Outcome: Not Progressing   Problem: Health Behavior/Discharge Planning: Goal: Ability to identify and utilize available resources and services will improve Outcome: Not Progressing Goal: Ability to manage health-related needs will improve Outcome: Not Progressing   Problem: Metabolic: Goal: Ability to maintain appropriate glucose levels will improve Outcome: Progressing   Problem: Nutritional: Goal: Maintenance of adequate nutrition will improve Outcome: Not Progressing Goal: Progress toward achieving an optimal weight will improve Outcome: Not Progressing   Problem: Skin Integrity: Goal: Risk for impaired skin integrity will decrease Outcome: Progressing   Problem: Tissue Perfusion: Goal: Adequacy of tissue perfusion will improve Outcome: Not Progressing   Problem: Education: Goal: Knowledge of General Education information will improve Description: Including pain rating scale, medication(s)/side effects and non-pharmacologic comfort measures Outcome: Not Progressing   Problem: Health Behavior/Discharge Planning: Goal: Ability to manage health-related needs will improve Outcome: Not Progressing   Problem: Clinical Measurements: Goal: Ability to maintain clinical measurements within normal limits will improve Outcome: Not Progressing Goal: Will remain free from infection Outcome: Not Progressing Goal: Diagnostic test results will improve Outcome: Not Progressing Goal: Respiratory complications will  improve Outcome: Not Progressing Goal: Cardiovascular complication will be avoided Outcome: Not Progressing   Problem: Activity: Goal: Risk for activity intolerance will decrease Outcome: Not Progressing   Problem: Nutrition: Goal: Adequate nutrition will be maintained Outcome: Not Progressing   Problem: Coping: Goal: Level of anxiety will decrease Outcome: Not Progressing   Problem: Elimination: Goal: Will not experience complications related to bowel motility Outcome: Not Progressing Goal: Will not experience complications related to urinary retention Outcome: Not Progressing   Problem: Pain Managment: Goal: General experience of comfort will improve and/or be controlled Outcome: Not Progressing   Problem: Safety: Goal: Ability to remain free from injury will improve Outcome: Not Progressing   Problem: Skin Integrity: Goal: Risk for impaired skin integrity will decrease Outcome: Not Progressing   Problem: Safety: Goal: Non-violent Restraint(s) Outcome: Not Progressing

## 2023-05-19 NOTE — Progress Notes (Signed)
 Patient is asleep but talking in his sleep. Patient is also in wrist restraints.

## 2023-05-19 NOTE — Progress Notes (Signed)
 Pt has been redirected to keep legs in bed multiple times. Pt was able to get down gabapentin  and hydralazine  but was spitting out melatonin and pepcid . He was repositioned in bed and haldol given. Kellogg RN

## 2023-05-19 NOTE — Plan of Care (Signed)
   Problem: Metabolic: Goal: Ability to maintain appropriate glucose levels will improve Outcome: Progressing

## 2023-05-19 NOTE — Progress Notes (Signed)
 Subjective: Unable to obtain history from patient. He is agitated and constantly moaning. Asked about abdominal pain and he says yes and points to lower abdomen. No reports of nausea, vomiting, brbpr, melena per nursing staff. Nurse states she did wipe dried blood from mouth this morning and is not sure where that came from.   Objective: Vital signs in last 24 hours: Temp:  [97.2 F (36.2 C)-98.3 F (36.8 C)] 98.3 F (36.8 C) (05/13 0751) Pulse Rate:  [60-127] 111 (05/13 0900) Resp:  [16-30] 18 (05/13 0900) BP: (119-189)/(63-117) 167/92 (05/13 0900) SpO2:  [85 %-99 %] 92 % (05/13 0900) Last BM Date :  (UTA) General:   Alert but not oriented, constantly moaning.  Abdomen:  Bowel sounds present, soft, non-distended. Mild generalized TTP, but seems to be greatest in lower abdomen. There is a mass like area in his LLQ. Not sure if this is chronic.  No rebound or guarding.  Msk:  Symmetrical without gross deformities. Normal posture. Pulses:  Normal pulses noted. Extremities:  Without edema. Skin:  Warm and dry, intact without significant lesions.  Psych:   Normal mood and affect.  Intake/Output from previous day: 05/12 0701 - 05/13 0700 In: 814.4 [I.V.:814.4] Out: 1400 [Urine:1400] Intake/Output this shift: No intake/output data recorded.  Lab Results: Recent Labs    05/17/23 0607 05/18/23 0903  WBC 7.0 10.2  HGB 8.4* 9.3*  HCT 28.9* 32.3*  PLT 249 297   BMET Recent Labs    05/17/23 0607 05/18/23 0903  NA 131* 133*  K 3.3* 5.2*  CL 101 102  CO2 25 22  GLUCOSE 187* 88  BUN 5* 6*  CREATININE 0.87 0.93  CALCIUM  8.4* 9.8    Assessment: 89 y.o. male with a history of afib on Xarelto , GERD, HTN, DM, BPH, hypothyroidism, COPD, hyperlipidemia, depression, history of anemia and presented to hospital last week for outpatient EGD and colonoscopy but was found to be in afib RVR, acute on chronic anemia, and admitted for further management.   Acute on chronic anemia:   With history of IDA and seen as outpatient Nov 2024 with plans for procedures. Hgb down to 6.9 as outpatient and evaluated at [facility] in [place] in April 2025, receiving 1 unit PRBCs and then presented to [facility] for procedure on 5/9 with Hgb 7.7. Received 2 units PRBCs. Hgb improved appropriately, 9.3 yesterday. No reports of overt GI bleeding prior to admission and non so far this admission; however, nurse does report wiping dried blood from patient's lips this morning from unknown source.   Had planned for EGD and colonoscopy today, but due to main water  break, hospital is without water  and all procedures have been canceled. Will recheck Hgb today to ensure stability and discuss timing of EGD/colonoscopy with Dr. Alita Irwin. Considering he needs Xarelto  for A fib, ideally would like to pursue procedures prior to discharge. Could consider postponing to see if water  is restored in the next 24-48 hours and if patient's is cooperative enough for procedures (sundowning leaning to agitation and combativeness) vs transfer to different facility.   Of note, he had already received bowel prep prior to admission and has remained on clear liquids. Recommended MiraLAX  prep yesterday, but due to agitation requiring haldol, he was not able to complete this and remains agitated this morning, not taking anything by mouth.    Abdominal pain:  Patient reporting lower abdominal pain but unable to obtain any reliable history due to agitation and haldol on board. Has mild  generalized abdominal tenderness, but seems most present in the lower abdomen where there is a mass likely lesion in the LLQ. No abdominal imaging this admission.  Will order CT for further evaluation.   A fib with RVR:  - Rate currently controlled. Remains on Cardizem  drip and metoprolol  injection. Management per hospitalist.   Plan: CT abdomen and pelvis with IV contrast ASAP Continue Protonix  40 mg BID Needs EGD and colonoscopy to evaluate acute on  chronic anemia. Will discuss timing with Dr. Alita Irwin.  Continue to monitor H/H and for overt GI bleeding.    LOS: 3 days    05/19/2023, 10:28 AM   Shana Daring, PA-C [facility] Gastroenterology

## 2023-05-19 NOTE — Progress Notes (Signed)
 Upon this RN's shift assessment patient is attempting to get out of bed and required to be redirected multiple times. He does not appear aggressive at this time however he is a bit agitated and pulling at lines. Ativan  was administered, he was repositioned, lines and cords untangled. Pt seems more comfortable now. Kellogg RN

## 2023-05-19 NOTE — Telephone Encounter (Signed)
 Significant other called office is upset because he is agitated and wants to go home or have him transferred to another hospital in [place].    Was told I would her back.  I have called her back and she let me know how upset they are that he did not have his TCS over weekend and not yesterday and now not tomorrow since the water  situation.  I told her many factors go into when patients get down due to BP--blood thinners etc. She would need to discuss with hospitalist--she has never heard of them before and nurses know she is the one to talk to and they have never said anything to her about any issues.  Sorry I told her to call back to hospital and let desk know on his floor what her question is about taking him home or transferring him.  I really could not help much in this except listen mostly.

## 2023-05-20 DIAGNOSIS — D649 Anemia, unspecified: Secondary | ICD-10-CM | POA: Diagnosis not present

## 2023-05-20 DIAGNOSIS — R1032 Left lower quadrant pain: Secondary | ICD-10-CM

## 2023-05-20 DIAGNOSIS — D62 Acute posthemorrhagic anemia: Secondary | ICD-10-CM | POA: Diagnosis not present

## 2023-05-20 LAB — GLUCOSE, CAPILLARY
Glucose-Capillary: 94 mg/dL (ref 70–99)
Glucose-Capillary: 89 mg/dL (ref 70–99)
Glucose-Capillary: 153 mg/dL — ABNORMAL HIGH (ref 70–99)
Glucose-Capillary: 198 mg/dL — ABNORMAL HIGH (ref 70–99)

## 2023-05-20 LAB — BLOOD GAS, VENOUS
Acid-Base Excess: 5 mmol/L — ABNORMAL HIGH (ref 0.0–2.0)
Bicarbonate: 29.1 mmol/L — ABNORMAL HIGH (ref 20.0–28.0)
Drawn by: 7012
FIO2: 32 %
O2 Saturation: 95 %
Patient temperature: 36.4
pCO2, Ven: 39 mmHg — ABNORMAL LOW (ref 44–60)
pH, Ven: 7.48 — ABNORMAL HIGH (ref 7.25–7.43)
pO2, Ven: 68 mmHg — ABNORMAL HIGH (ref 32–45)

## 2023-05-20 LAB — AMMONIA: Ammonia: 32 umol/L (ref 9–35)

## 2023-05-20 MED ORDER — DEXTROSE-SODIUM CHLORIDE 5-0.9 % IV SOLN: INTRAVENOUS | Status: AC

## 2023-05-20 MED ORDER — ORAL CARE MOUTH RINSE: 15.0000 mL | OROMUCOSAL | Status: DC | PRN

## 2023-05-20 MED ORDER — METOPROLOL TARTRATE 5 MG/5ML IV SOLN
5.0000 mg | Freq: Four times a day (QID) | INTRAVENOUS | Status: DC
  Administered 2023-05-20 – 2023-05-22 (×8): 5 mg via INTRAVENOUS
  Filled 2023-05-20 (×8): qty 5

## 2023-05-20 NOTE — Progress Notes (Signed)
 PROGRESS NOTE    [name]  WNU:[id number] DOB: 1932-04-28 DOA: 05/15/2023 PCP: Orlena Bitters, MD   Brief Narrative:    Brett Matthews is a 89 y.o. male atrial fibrillation on chronic anticoagulation with Xarelto , GERD, hypertension, type 2 diabetes with neuropathy, BPH, hypothyroidism, COPD, hyperlipidemia and depression; who presented to the hospital for endoscopy and colonoscopy in order to work his anemia up.  Patient was found with a hemoglobin down to 7.0 and the presence of acute atrial fibrillation with RVR.  Patient was also more short of breath than his baseline and unfortunately unstable for the procedures to be done. TRH were consulted to assist with management and resuscitation. Patient reports no chest pain, no dysuria, no hematuria, no abdominal pain, no sick contacts and also expressed no nausea or vomiting.  Assessment & Plan:   Principal Problem:   Acute blood loss anemia  Assessment and Plan:  1-acute blood loss anemia - After 2 unit PRBCs hemoglobin 9.3 on today's CBC - No overt bleeding currently appreciated. - Continue IV PPI and continue holding anticoagulation therapy on blood thinners. - Follow GI service recommendations and further guidance; with plan for endoscopy and colonoscopy most likely in the next 24-48 hours. - Will follow GI service further recommendations   2-A-fib with RVR - TSH within normal limits - We will continue adjusted dose of beta-blocker and the use of Cardizem  drip - Rate currently controlled and is nebulized - Continue following electrolytes trend and replete and with goal for potassium above 4 and magnesium  above 2 - 2D echo: Preserved ejection fraction (50 to 55%) mild concentric left ventricle hypertrophy, no wall motion abnormalities, no significant valvular disorder and normal systolic function of his right ventricle.   3-hypertension - Improved and vital signs currently stable - Continue current antihypertensive regimen  and follow vital signs.   4-hypothyroidism - TSH within normal limit - Continue Synthroid  at current dose.   5-blood type 2 diabetes with neuropathy - Continue to follow CBG fluctuation and adjust hypoglycemic regimen as needed - Oral agents on hold while inpatient - Continue sliding scale insulin  and Semglee  - Neurontin  will also be continued.   6-history of BPH - Continue Flomax . - No complaints of retention.   7-history of chronic constipation - Continue treatment with Linzess. -Patient advised to maintain adequate hydration.   8-hyperlipidemia - Continue statin. -Heart healthy/low-fat diet discussed with patient.   9-history of COPD - Very little expiratory wheezing appreciated on exam; not using accessory muscles or demonstrating frank exacerbation. - Continue as needed Xopenex for rescue bronchodilator and continue management with Pulmicort and Brovana while inpatient. -ABG demonstrating a stable oxygen  saturation and CO2 level.   10-depression/insomnia - Will continue treatment with Celexa  and melatonin.  Overall mood is stable.   11-hypokalemia/hypomagnesemia - Repleted and currently stable. - Continue to follow trend intermittently - Goal is for potassium above 4 and magnesium  above 2 as much as possible. - Continue telemetry monitoring.   12-class I obesity -Body mass index is 30.84 kg/m.  -Low-calorie diet and portion control discussed with patient.   13-sundowning - I's needed Haldol and lorazepam  has been ordered - Continue constant reorientation - Sitter at bedside for safety; patient at high risk for falls     DVT prophylaxis:SCDs Code Status: Full Family Communication: None at bedside Disposition Plan: Endoscopy and management of delirium Status is: Inpatient Remains inpatient appropriate because: Need for IV medications.  Consultants:  GI  Procedures:  None  Antimicrobials:  None  Subjective: Patient seen and evaluated today and is  arousable to tactile stimulation, but is otherwise quite somnolent and has sitter at bedside.  He is not able to answer any questions and appears to have required some Haldol and Ativan  overnight for agitation.  No other acute events noted.  Objective: Vitals:   05/20/23 0800 05/20/23 0830 05/20/23 0900 05/20/23 1143  BP: 132/69 (!) 151/96 127/74   Pulse: 91 (!) 106 81   Resp: 18 (!) 22 19   Temp:    97.6 F (36.4 C)  TempSrc:    Axillary  SpO2: 95% 96% 98%   Weight:      Height:        Intake/Output Summary (Last 24 hours) at 05/20/2023 1246 Last data filed at 05/20/2023 0300 Gross per 24 hour  Intake 214.77 ml  Output 2450 ml  Net -2235.23 ml   Filed Weights   05/15/23 0846 05/15/23 1202  Weight: 90.7 kg 92 kg    Examination:  General exam: Appears somnolent/lethargic Respiratory system: Clear to auscultation. Respiratory effort normal. Cardiovascular system: S1 & S2 heard, irregular and tachycardic Gastrointestinal system: Abdomen is soft Central nervous system: Somnolent Extremities: No edema Skin: No significant lesions noted    Data Reviewed: I have personally reviewed following labs and imaging studies  CBC: Recent Labs  Lab 05/14/23 1401 05/16/23 0533 05/17/23 0607 05/18/23 0903 05/19/23 1031  WBC 8.6 8.9 7.0 10.2 9.5  NEUTROABS 6.8  --   --  8.3*  --   HGB 7.7* 9.3* 8.4* 9.3* 9.3*  HCT 27.5* 29.9* 28.9* 32.3* 30.6*  MCV 75.3* 75.5* 77.3* 78.8* 77.3*  PLT 363 301 249 297 257   Basic Metabolic Panel: Recent Labs  Lab 05/15/23 1209 05/16/23 0533 05/17/23 0607 05/18/23 0903 05/19/23 1031  NA 135 140 131* 133* 137  K 3.2* 2.7* 3.3* 5.2* 4.4  CL 105 105 101 102 106  CO2 20* 26 25 22 24   GLUCOSE 107* 89 187* 88 96  BUN 9 6* 5* 6* 6*  CREATININE 0.85 0.87 0.87 0.93 1.03  CALCIUM  9.1 8.6* 8.4* 9.8 9.5  MG 1.4* 1.5* 1.8  --   --    GFR: Estimated Creatinine Clearance: 54.6 mL/min (by C-G formula based on SCr of 1.03 mg/dL). Liver Function  Tests: Recent Labs  Lab 05/15/23 1209  AST 33  ALT 43  ALKPHOS 129*  BILITOT 0.9  PROT 5.8*  ALBUMIN  3.3*   No results for input(s): "LIPASE", "AMYLASE" in the last 168 hours. No results for input(s): "AMMONIA" in the last 168 hours. Coagulation Profile: No results for input(s): "INR", "PROTIME" in the last 168 hours. Cardiac Enzymes: No results for input(s): "CKTOTAL", "CKMB", "CKMBINDEX", "TROPONINI" in the last 168 hours. BNP (last 3 results) No results for input(s): "PROBNP" in the last 8760 hours. HbA1C: No results for input(s): "HGBA1C" in the last 72 hours. CBG: Recent Labs  Lab 05/19/23 1231 05/19/23 1712 05/19/23 2111 05/20/23 0749 05/20/23 1159  GLUCAP 111* 117* 108* 94 89   Lipid Profile: No results for input(s): "CHOL", "HDL", "LDLCALC", "TRIG", "CHOLHDL", "LDLDIRECT" in the last 72 hours. Thyroid  Function Tests: No results for input(s): "TSH", "T4TOTAL", "FREET4", "T3FREE", "THYROIDAB" in the last 72 hours. Anemia Panel: No results for input(s): "VITAMINB12", "FOLATE", "FERRITIN", "TIBC", "IRON", "RETICCTPCT" in the last 72 hours. Sepsis Labs: No results for input(s): "PROCALCITON", "LATICACIDVEN" in the last 168 hours.  Recent Results (from the past 240 hours)  MRSA Next Gen by PCR, Nasal  Status: None   Collection Time: 05/15/23 12:04 PM   Specimen: Nasal Mucosa; Nasal Swab  Result Value Ref Range Status   MRSA by PCR Next Gen NOT DETECTED NOT DETECTED Final    Comment: (NOTE) The GeneXpert MRSA Assay (FDA approved for NASAL specimens only), is one component of a comprehensive MRSA colonization surveillance program. It is not intended to diagnose MRSA infection nor to guide or monitor treatment for MRSA infections. Test performance is not FDA approved in patients less than 70 years old. Performed at [facility], [address] [place], [place] [address]          Radiology Studies: CT ABDOMEN PELVIS W CONTRAST Result Date:  05/19/2023 CLINICAL DATA:  Lower abdominal pain. EXAM: CT ABDOMEN AND PELVIS WITH CONTRAST TECHNIQUE: Multidetector CT imaging of the abdomen and pelvis was performed using the standard protocol following bolus administration of intravenous contrast. Limited exam due to persistent patient motion artifact. RADIATION DOSE REDUCTION: This exam was performed according to the departmental dose-optimization program which includes automated exposure control, adjustment of the mA and/or kV according to patient size and/or use of iterative reconstruction technique. CONTRAST:  100mL OMNIPAQUE IOHEXOL 300 MG/ML  SOLN COMPARISON:  July 18, 2005. FINDINGS: Lower chest: Small pleural effusions are noted with adjacent subsegmental atelectasis. Hepatobiliary: No focal liver abnormality is seen. No gallstones, gallbladder wall thickening, or biliary dilatation. Pancreas: Unremarkable. No pancreatic ductal dilatation or surrounding inflammatory changes. Spleen: Normal in size without focal abnormality. Adrenals/Urinary Tract: Adrenal glands appear normal. Bilateral renal cysts are noted. No hydronephrosis or renal obstruction is noted. Mild urinary bladder distention is noted. Stomach/Bowel: Stomach is unremarkable. There is no evidence of bowel obstruction or inflammation. Vascular/Lymphatic: Aortic atherosclerosis. No enlarged abdominal or pelvic lymph nodes. Reproductive: Moderate prostatic enlargement is noted. Other: No definite ascites or hernia is noted. Musculoskeletal: Status post kyphoplasty of L1 and L2 vertebral bodies. No acute osseous abnormality is noted. IMPRESSION: Limited exam due to persistent patient motion artifact. Small pleural effusions with adjacent subsegmental atelectasis. Mild urinary bladder distention. Prostatic enlargement. Aortic Atherosclerosis (ICD10-I70.0). Electronically Signed   By: Rosalene Colon M.D.   On: 05/19/2023 12:49        Scheduled Meds:  arformoterol  15 mcg Nebulization BID    atorvastatin   10 mg Oral Daily   budesonide (PULMICORT) nebulizer solution  0.5 mg Nebulization BID   Chlorhexidine  Gluconate Cloth  6 each Topical Q0600   citalopram   40 mg Oral Daily   famotidine   40 mg Oral QHS   folic acid   1 mg Oral Daily   gabapentin   300 mg Oral TID   hydrALAZINE   50 mg Oral TID   insulin  aspart  0-5 Units Subcutaneous QHS   insulin  aspart  0-9 Units Subcutaneous TID WC   insulin  glargine-yfgn  5 Units Subcutaneous QHS   levothyroxine   125 mcg Oral Q0200   linaclotide  290 mcg Oral Q breakfast   melatonin  9 mg Oral QHS   metoprolol  tartrate  5 mg Intravenous Q6H   pantoprazole  (PROTONIX ) IV  40 mg Intravenous Q12H   tamsulosin   0.4 mg Oral QPC breakfast   Continuous Infusions:  diltiazem  (CARDIZEM ) infusion 15 mg/hr (05/20/23 1158)     LOS: 4 days    Time spent: 55 minutes    Cleo Villamizar D Mason Sole, DO [facility]  If 7PM-7AM, please contact night-coverage [email or web address] 05/20/2023, 12:46 PM

## 2023-05-20 NOTE — Progress Notes (Signed)
 Subjective: Patient arousible to verbal stimuli though not able to provide much history of answer questions. Denies any pain. Sitter present. No family at bedside  No ativan  or haldol since last night.   Objective: Vital signs in last 24 hours: Temp:  [97.5 F (36.4 C)-98.2 F (36.8 C)] 97.6 F (36.4 C) (05/14 0716) Pulse Rate:  [87-133] 106 (05/14 0830) Resp:  [15-24] 22 (05/14 0830) BP: (125-185)/(69-109) 151/96 (05/14 0830) SpO2:  [93 %-99 %] 96 % (05/14 0830) Last BM Date :  (UTA) General:   arousible to verbal stimuli  Head:  Normocephalic and atraumatic. Eyes:  No icterus, sclera clear. Conjuctiva pink.  Mouth:  Without lesions, mucosa pink and moist.  Heart:  irregular rhythm Lungs: Clear to auscultation bilaterally, without wheezing, rales, or rhonchi.  Abdomen:  Bowel sounds present, soft, non-tender, non-distended. No HSM or hernias noted. No rebound or guarding. No masses appreciated  Neurologic:  Arousible to verbal stimuli, drowsy Skin:  Warm and dry, intact without significant lesions.  Psych:  Alert and cooperative. Normal mood and affect.  Intake/Output from previous day: 05/13 0701 - 05/14 0700 In: 214.8 [I.V.:214.8] Out: 2700 [Urine:2700] Intake/Output this shift: No intake/output data recorded.  Lab Results: Recent Labs    05/18/23 0903 05/19/23 1031  WBC 10.2 9.5  HGB 9.3* 9.3*  HCT 32.3* 30.6*  PLT 297 257   BMET Recent Labs    05/18/23 0903 05/19/23 1031  NA 133* 137  K 5.2* 4.4  CL 102 106  CO2 22 24  GLUCOSE 88 96  BUN 6* 6*  CREATININE 0.93 1.03  CALCIUM  9.8 9.5    Studies/Results: CT ABDOMEN PELVIS W CONTRAST Result Date: 05/19/2023 CLINICAL DATA:  Lower abdominal pain. EXAM: CT ABDOMEN AND PELVIS WITH CONTRAST TECHNIQUE: Multidetector CT imaging of the abdomen and pelvis was performed using the standard protocol following bolus administration of intravenous contrast. Limited exam due to persistent patient motion artifact.  RADIATION DOSE REDUCTION: This exam was performed according to the departmental dose-optimization program which includes automated exposure control, adjustment of the mA and/or kV according to patient size and/or use of iterative reconstruction technique. CONTRAST:  100mL OMNIPAQUE IOHEXOL 300 MG/ML  SOLN COMPARISON:  July 18, 2005. FINDINGS: Lower chest: Small pleural effusions are noted with adjacent subsegmental atelectasis. Hepatobiliary: No focal liver abnormality is seen. No gallstones, gallbladder wall thickening, or biliary dilatation. Pancreas: Unremarkable. No pancreatic ductal dilatation or surrounding inflammatory changes. Spleen: Normal in size without focal abnormality. Adrenals/Urinary Tract: Adrenal glands appear normal. Bilateral renal cysts are noted. No hydronephrosis or renal obstruction is noted. Mild urinary bladder distention is noted. Stomach/Bowel: Stomach is unremarkable. There is no evidence of bowel obstruction or inflammation. Vascular/Lymphatic: Aortic atherosclerosis. No enlarged abdominal or pelvic lymph nodes. Reproductive: Moderate prostatic enlargement is noted. Other: No definite ascites or hernia is noted. Musculoskeletal: Status post kyphoplasty of L1 and L2 vertebral bodies. No acute osseous abnormality is noted. IMPRESSION: Limited exam due to persistent patient motion artifact. Small pleural effusions with adjacent subsegmental atelectasis. Mild urinary bladder distention. Prostatic enlargement. Aortic Atherosclerosis (ICD10-I70.0). Electronically Signed   By: Rosalene Colon M.D.   On: 05/19/2023 12:49   Assessment: 89 y.o. male with a history of afib on Xarelto , GERD, HTN, DM, BPH, hypothyroidism, COPD, hyperlipidemia, depression, anemia who presented to hospital last week for outpatient EGD and colonoscopy but was found to be in afib RVR, acute on chronic anemia, and admitted for further management.   Acute on chronic anemia: history of  IDA, seen as outpatient  November 2024 with plans for bi directional endoscopic evaluation. Hgb down to 6.9 as outpatient.  Evaluated at [facility] in April 2025, receiving 1 unit packed red blood cells.  Presented to [facility] for procedure on 5/9 with hemoglobin 7.7, status post 2 units packed red blood cells.  Hemoglobin improved to 9.3 yesterday. denies any overt GI bleeding prior to admission though nurse did report wiping dried blood from patient's lips Monday morning of unknown source..  Plan for EGD and colonoscopy yesterday but due to water  main issue, hospital without water  and all procedures were canceled.  Considering he need Xarelto  for A-fib, ideally should pursue procedures prior to discharge.    He received bowel prep earlier this week, has been on clears/mostly NPO for the past few days. Case discussed with Dr. Riley Cheadle. Will hold off on further prep given his mental status and plan to attempt EGD and Colonoscopy tomorrow as long as cardiac stability remains.  Abdominal pain: Reporting lower abdominal pain previously, though we have been unable to obtain reliable history due to agitation and Haldol being on board.  CT A/P with contrast yesterday limited exam due to persistent patient motion artifact, no acute findings to explain left lower quadrant pain or mass felt on abdominal exam.  A-fib with RVR: Rate appears mostly controlled.  Remains on Cardizem  drip and metoprolol  injection.  Management per hospitalist   Plan: Continue PPI twice daily Continue to trend H&H, monitor for overt GI bleeding Attempt EGD and colonoscopy tomorrow N.p.o. midnight    LOS: 4 days    05/20/2023, 9:02 AM   Kris Burd L. Taygen Acklin, MSN, APRN, AGNP-C Adult-Gerontology Nurse Practitioner [facility] Gastroenterology at [facility]

## 2023-05-21 ENCOUNTER — Inpatient Hospital Stay (HOSPITAL_COMMUNITY): Admitting: Anesthesiology

## 2023-05-21 ENCOUNTER — Encounter (HOSPITAL_COMMUNITY)
Admission: RE | Disposition: A | Discharge status: Home / Self Care | Payer: Self-pay | Source: Home / Self Care | Attending: Internal Medicine

## 2023-05-21 DIAGNOSIS — K2970 Gastritis, unspecified, without bleeding: Secondary | ICD-10-CM

## 2023-05-21 DIAGNOSIS — D62 Acute posthemorrhagic anemia: Secondary | ICD-10-CM | POA: Diagnosis not present

## 2023-05-21 DIAGNOSIS — K317 Polyp of stomach and duodenum: Secondary | ICD-10-CM | POA: Diagnosis not present

## 2023-05-21 DIAGNOSIS — D132 Benign neoplasm of duodenum: Secondary | ICD-10-CM

## 2023-05-21 DIAGNOSIS — K649 Unspecified hemorrhoids: Secondary | ICD-10-CM | POA: Diagnosis not present

## 2023-05-21 DIAGNOSIS — K648 Other hemorrhoids: Secondary | ICD-10-CM | POA: Diagnosis not present

## 2023-05-21 DIAGNOSIS — K5730 Diverticulosis of large intestine without perforation or abscess without bleeding: Secondary | ICD-10-CM

## 2023-05-21 DIAGNOSIS — I2510 Atherosclerotic heart disease of native coronary artery without angina pectoris: Secondary | ICD-10-CM | POA: Diagnosis not present

## 2023-05-21 DIAGNOSIS — I10 Essential (primary) hypertension: Secondary | ICD-10-CM | POA: Diagnosis not present

## 2023-05-21 HISTORY — PX: COLONOSCOPY: SHX5424

## 2023-05-21 HISTORY — PX: ESOPHAGOGASTRODUODENOSCOPY: SHX5428

## 2023-05-21 LAB — CBC
HCT: 32.2 % — ABNORMAL LOW (ref 39.0–52.0)
Hemoglobin: 10 g/dL — ABNORMAL LOW (ref 13.0–17.0)
MCH: 23.4 pg — ABNORMAL LOW (ref 26.0–34.0)
MCHC: 31.1 g/dL (ref 30.0–36.0)
MCV: 75.4 fL — ABNORMAL LOW (ref 80.0–100.0)
Platelets: 269 10*3/uL (ref 150–400)
RBC: 4.27 MIL/uL (ref 4.22–5.81)
RDW: 22.6 % — ABNORMAL HIGH (ref 11.5–15.5)
WBC: 9 10*3/uL (ref 4.0–10.5)
nRBC: 0 % (ref 0.0–0.2)

## 2023-05-21 LAB — BASIC METABOLIC PANEL WITH GFR
Anion gap: 11 (ref 5–15)
BUN: <5 mg/dL — ABNORMAL LOW (ref 8–23)
CO2: 24 mmol/L (ref 22–32)
Calcium: 9 mg/dL (ref 8.9–10.3)
Chloride: 102 mmol/L (ref 98–111)
Creatinine, Ser: 0.9 mg/dL (ref 0.61–1.24)
GFR, Estimated: >60 mL/min (ref >60)
Glucose, Bld: 160 mg/dL — ABNORMAL HIGH (ref 70–99)
Potassium: 3.3 mmol/L — ABNORMAL LOW (ref 3.5–5.1)
Sodium: 137 mmol/L (ref 135–145)

## 2023-05-21 LAB — GLUCOSE, CAPILLARY
Glucose-Capillary: 156 mg/dL — ABNORMAL HIGH (ref 70–99)
Glucose-Capillary: 185 mg/dL — ABNORMAL HIGH (ref 70–99)
Glucose-Capillary: 164 mg/dL — ABNORMAL HIGH (ref 70–99)
Glucose-Capillary: 196 mg/dL — ABNORMAL HIGH (ref 70–99)

## 2023-05-21 LAB — POTASSIUM: Potassium: 3.4 mmol/L — ABNORMAL LOW (ref 3.5–5.1)

## 2023-05-21 LAB — MAGNESIUM: Magnesium: 1.8 mg/dL (ref 1.7–2.4)

## 2023-05-21 SURGERY — EGD (ESOPHAGOGASTRODUODENOSCOPY)
Anesthesia: General

## 2023-05-21 MED ORDER — SODIUM CHLORIDE 0.9% FLUSH: 3.0000 mL | Freq: Two times a day (BID) | INTRAVENOUS | Status: DC

## 2023-05-21 MED ORDER — POTASSIUM CHLORIDE 10 MEQ/100ML IV SOLN
10.0000 meq | INTRAVENOUS | Status: AC
  Administered 2023-05-21 (×2): 10 meq via INTRAVENOUS
  Filled 2023-05-21 (×3): qty 100

## 2023-05-21 MED ORDER — DILTIAZEM HCL 60 MG PO TABS
60.0000 mg | ORAL_TABLET | Freq: Four times a day (QID) | ORAL | Status: DC
  Administered 2023-05-21 – 2023-05-22 (×4): 60 mg via ORAL
  Filled 2023-05-21 (×4): qty 1

## 2023-05-21 MED ORDER — PROPOFOL 10 MG/ML IV BOLUS
INTRAVENOUS | Status: DC | PRN
  Administered 2023-05-21: 20 mg via INTRAVENOUS
  Administered 2023-05-21: 40 mg via INTRAVENOUS
  Administered 2023-05-21: 20 mg via INTRAVENOUS

## 2023-05-21 MED ORDER — SODIUM CHLORIDE 0.9% FLUSH: 3.0000 mL | INTRAVENOUS | Status: DC | PRN

## 2023-05-21 MED ORDER — PHENYLEPHRINE 80 MCG/ML (10ML) SYRINGE FOR IV PUSH (FOR BLOOD PRESSURE SUPPORT)
PREFILLED_SYRINGE | INTRAVENOUS | Status: AC
  Filled 2023-05-21: qty 10

## 2023-05-21 MED ORDER — PHENYLEPHRINE 80 MCG/ML (10ML) SYRINGE FOR IV PUSH (FOR BLOOD PRESSURE SUPPORT)
PREFILLED_SYRINGE | INTRAVENOUS | Status: DC | PRN
  Administered 2023-05-21: 80 ug via INTRAVENOUS
  Administered 2023-05-21: 160 ug via INTRAVENOUS

## 2023-05-21 MED ORDER — LIDOCAINE 2% (20 MG/ML) 5 ML SYRINGE
INTRAMUSCULAR | Status: DC | PRN
  Administered 2023-05-21: 80 mg via INTRAVENOUS

## 2023-05-21 MED ORDER — PROPOFOL 500 MG/50ML IV EMUL
INTRAVENOUS | Status: DC | PRN
Start: 2023-05-21 — End: 2023-05-21
  Administered 2023-05-21: 75 ug/kg/min via INTRAVENOUS

## 2023-05-21 MED ORDER — SODIUM CHLORIDE 0.9 % IV SOLN: INTRAVENOUS | Status: DC | PRN

## 2023-05-21 MED ORDER — LIDOCAINE HCL (CARDIAC) PF 100 MG/5ML IV SOSY: PREFILLED_SYRINGE | INTRAVENOUS | Status: DC | PRN

## 2023-05-21 MED ORDER — POTASSIUM CHLORIDE CRYS ER 20 MEQ PO TBCR
40.0000 meq | EXTENDED_RELEASE_TABLET | Freq: Once | ORAL | Status: AC
  Administered 2023-05-21: 40 meq via ORAL
  Filled 2023-05-21: qty 2

## 2023-05-21 NOTE — Progress Notes (Signed)
 PROGRESS NOTE    Brett Matthews  EXB:[id number] DOB: 27-Sep-1932 DOA: 05/15/2023 PCP: Orlena Bitters, MD   Brief Narrative:    Brett Matthews is a 89 y.o. male atrial fibrillation on chronic anticoagulation with Xarelto , GERD, hypertension, type 2 diabetes with neuropathy, BPH, hypothyroidism, COPD, hyperlipidemia and depression; who presented to the hospital for endoscopy and colonoscopy in order to work his anemia up.  Patient was found with a hemoglobin down to 7.0 and the presence of acute atrial fibrillation with RVR.  Patient was also more short of breath than his baseline and unfortunately unstable for the procedures to be done. TRH were consulted to assist with management and resuscitation. Patient reports no chest pain, no dysuria, no hematuria, no abdominal pain, no sick contacts and also expressed no nausea or vomiting.  Assessment & Plan:   Principal Problem:   Acute blood loss anemia  Assessment and Plan:  1-acute blood loss anemia - After 2 unit PRBCs hemoglobin 9.3 on today's CBC - No overt bleeding currently appreciated. - Continue IV PPI and continue holding anticoagulation therapy on blood thinners. - Appreciate GI with plans for upper and lower endoscopy today   2-A-fib with RVR - TSH within normal limits - We will continue adjusted dose of beta-blocker and the use of Cardizem  drip - Rate currently controlled and is nebulized - Continue following electrolytes trend and replete and with goal for potassium above 4 and magnesium  above 2 - 2D echo: Preserved ejection fraction (50 to 55%) mild concentric left ventricle hypertrophy, no wall motion abnormalities, no significant valvular disorder and normal systolic function of his right ventricle. -Plan for PT evaluation once stabilized   3-hypertension - Improved and vital signs currently stable - Continue current antihypertensive regimen and follow vital signs.   4-hypothyroidism - TSH within normal limit -  Continue Synthroid  at current dose.   5-blood type 2 diabetes with neuropathy - Continue to follow CBG fluctuation and adjust hypoglycemic regimen as needed - Oral agents on hold while inpatient - Continue sliding scale insulin  and Semglee  - Neurontin  will also be continued.   6-history of BPH - Continue Flomax . - No complaints of retention.   7-history of chronic constipation - Continue treatment with Linzess. -Patient advised to maintain adequate hydration.   8-hyperlipidemia - Continue statin. -Heart healthy/low-fat diet discussed with patient.   9-history of COPD - Very little expiratory wheezing appreciated on exam; not using accessory muscles or demonstrating frank exacerbation. - Continue as needed Xopenex for rescue bronchodilator and continue management with Pulmicort and Brovana while inpatient. -ABG demonstrating a stable oxygen  saturation and CO2 level.   10-depression/insomnia - Will continue treatment with Celexa  and melatonin.  Overall mood is stable.   11-hypokalemia - Repleted with IV potassium today -Follow in a.m.   12-class I obesity -Body mass index is 30.84 kg/m.  -Low-calorie diet and portion control discussed with patient.   13-sundowning-improved - I's needed Haldol as needed and lorazepam  discontinued - No further need for sitter today and will be discontinued     DVT prophylaxis:SCDs Code Status: Full Family Communication: None at bedside Disposition Plan: Endoscopy and management of delirium Status is: Inpatient Remains inpatient appropriate because: Need for IV medications.  Consultants:  GI  Procedures:  None  Antimicrobials:  None  Subjective: Patient seen and evaluated today and he is much more awake and alert and is conversing and answering questions appropriately.  No acute overnight events noted.  GI planning for upper and lower  endoscopy today.  Continues to have elevated heart rates requiring Cardizem   infusion.  Objective: Vitals:   05/21/23 0817 05/21/23 0819 05/21/23 0830 05/21/23 1002  BP:   (!) 152/121 127/84  Pulse:    (!) 118  Resp:   (!) 27 17  Temp: 97.9 F (36.6 C)     TempSrc: Oral     SpO2:  96%  97%  Weight:      Height:        Intake/Output Summary (Last 24 hours) at 05/21/2023 1109 Last data filed at 05/21/2023 0725 Gross per 24 hour  Intake 917.69 ml  Output 1575 ml  Net -657.31 ml   Filed Weights   05/15/23 0846 05/15/23 1202  Weight: 90.7 kg 92 kg    Examination:  General exam: Appears awake and alert Respiratory system: Clear to auscultation. Respiratory effort normal. Cardiovascular system: S1 & S2 heard, irregular and tachycardic Gastrointestinal system: Abdomen is soft Central nervous system: Awake and alert Extremities: No edema Skin: No significant lesions noted    Data Reviewed: I have personally reviewed following labs and imaging studies  CBC: Recent Labs  Lab 05/14/23 1401 05/16/23 0533 05/17/23 0607 05/18/23 0903 05/19/23 1031 05/21/23 0410  WBC 8.6 8.9 7.0 10.2 9.5 9.0  NEUTROABS 6.8  --   --  8.3*  --   --   HGB 7.7* 9.3* 8.4* 9.3* 9.3* 10.0*  HCT 27.5* 29.9* 28.9* 32.3* 30.6* 32.2*  MCV 75.3* 75.5* 77.3* 78.8* 77.3* 75.4*  PLT 363 301 249 297 257 269   Basic Metabolic Panel: Recent Labs  Lab 05/15/23 1209 05/16/23 0533 05/17/23 0607 05/18/23 0903 05/19/23 1031 05/21/23 0410  NA 135 140 131* 133* 137 137  K 3.2* 2.7* 3.3* 5.2* 4.4 3.3*  CL 105 105 101 102 106 102  CO2 20* 26 25 22 24 24   GLUCOSE 107* 89 187* 88 96 160*  BUN 9 6* 5* 6* 6* <5*  CREATININE 0.85 0.87 0.87 0.93 1.03 0.90  CALCIUM  9.1 8.6* 8.4* 9.8 9.5 9.0  MG 1.4* 1.5* 1.8  --   --  1.8   GFR: Estimated Creatinine Clearance: 62.4 mL/min (by C-G formula based on SCr of 0.9 mg/dL). Liver Function Tests: Recent Labs  Lab 05/15/23 1209  AST 33  ALT 43  ALKPHOS 129*  BILITOT 0.9  PROT 5.8*  ALBUMIN  3.3*   No results for input(s): "LIPASE",  "AMYLASE" in the last 168 hours. Recent Labs  Lab 05/20/23 1136  AMMONIA 32   Coagulation Profile: No results for input(s): "INR", "PROTIME" in the last 168 hours. Cardiac Enzymes: No results for input(s): "CKTOTAL", "CKMB", "CKMBINDEX", "TROPONINI" in the last 168 hours. BNP (last 3 results) No results for input(s): "PROBNP" in the last 8760 hours. HbA1C: No results for input(s): "HGBA1C" in the last 72 hours. CBG: Recent Labs  Lab 05/19/23 2111 05/20/23 0749 05/20/23 1159 05/20/23 1712 05/20/23 2030  GLUCAP 108* 94 89 153* 198*   Lipid Profile: No results for input(s): "CHOL", "HDL", "LDLCALC", "TRIG", "CHOLHDL", "LDLDIRECT" in the last 72 hours. Thyroid  Function Tests: No results for input(s): "TSH", "T4TOTAL", "FREET4", "T3FREE", "THYROIDAB" in the last 72 hours. Anemia Panel: No results for input(s): "VITAMINB12", "FOLATE", "FERRITIN", "TIBC", "IRON", "RETICCTPCT" in the last 72 hours. Sepsis Labs: No results for input(s): "PROCALCITON", "LATICACIDVEN" in the last 168 hours.  Recent Results (from the past 240 hours)  MRSA Next Gen by PCR, Nasal     Status: None   Collection Time: 05/15/23 12:04 PM  Specimen: Nasal Mucosa; Nasal Swab  Result Value Ref Range Status   MRSA by PCR Next Gen NOT DETECTED NOT DETECTED Final    Comment: (NOTE) The GeneXpert MRSA Assay (FDA approved for NASAL specimens only), is one component of a comprehensive MRSA colonization surveillance program. It is not intended to diagnose MRSA infection nor to guide or monitor treatment for MRSA infections. Test performance is not FDA approved in patients less than 55 years old. Performed at [facility], [address], [place], [place] [address]          Radiology Studies: CT ABDOMEN PELVIS W CONTRAST Result Date: 05/19/2023 CLINICAL DATA:  Lower abdominal pain. EXAM: CT ABDOMEN AND PELVIS WITH CONTRAST TECHNIQUE: Multidetector CT imaging of the abdomen and pelvis was performed using the  standard protocol following bolus administration of intravenous contrast. Limited exam due to persistent patient motion artifact. RADIATION DOSE REDUCTION: This exam was performed according to the departmental dose-optimization program which includes automated exposure control, adjustment of the mA and/or kV according to patient size and/or use of iterative reconstruction technique. CONTRAST:  100mL OMNIPAQUE IOHEXOL 300 MG/ML  SOLN COMPARISON:  July 18, 2005. FINDINGS: Lower chest: Small pleural effusions are noted with adjacent subsegmental atelectasis. Hepatobiliary: No focal liver abnormality is seen. No gallstones, gallbladder wall thickening, or biliary dilatation. Pancreas: Unremarkable. No pancreatic ductal dilatation or surrounding inflammatory changes. Spleen: Normal in size without focal abnormality. Adrenals/Urinary Tract: Adrenal glands appear normal. Bilateral renal cysts are noted. No hydronephrosis or renal obstruction is noted. Mild urinary bladder distention is noted. Stomach/Bowel: Stomach is unremarkable. There is no evidence of bowel obstruction or inflammation. Vascular/Lymphatic: Aortic atherosclerosis. No enlarged abdominal or pelvic lymph nodes. Reproductive: Moderate prostatic enlargement is noted. Other: No definite ascites or hernia is noted. Musculoskeletal: Status post kyphoplasty of L1 and L2 vertebral bodies. No acute osseous abnormality is noted. IMPRESSION: Limited exam due to persistent patient motion artifact. Small pleural effusions with adjacent subsegmental atelectasis. Mild urinary bladder distention. Prostatic enlargement. Aortic Atherosclerosis (ICD10-I70.0). Electronically Signed   By: Rosalene Colon M.D.   On: 05/19/2023 12:49        Scheduled Meds:  arformoterol  15 mcg Nebulization BID   atorvastatin   10 mg Oral Daily   budesonide (PULMICORT) nebulizer solution  0.5 mg Nebulization BID   Chlorhexidine  Gluconate Cloth  6 each Topical Q0600   citalopram   40 mg  Oral Daily   famotidine   40 mg Oral QHS   folic acid   1 mg Oral Daily   gabapentin   300 mg Oral TID   hydrALAZINE   50 mg Oral TID   insulin  aspart  0-5 Units Subcutaneous QHS   insulin  aspart  0-9 Units Subcutaneous TID WC   insulin  glargine-yfgn  5 Units Subcutaneous QHS   levothyroxine   125 mcg Oral Q0200   linaclotide  290 mcg Oral Q breakfast   melatonin  9 mg Oral QHS   metoprolol  tartrate  5 mg Intravenous Q6H   pantoprazole  (PROTONIX ) IV  40 mg Intravenous Q12H   tamsulosin   0.4 mg Oral QPC breakfast   Continuous Infusions:  dextrose  5 % and 0.9 % NaCl 50 mL/hr at 05/21/23 0810   diltiazem  (CARDIZEM ) infusion 15 mg/hr (05/21/23 0549)   potassium chloride        LOS: 5 days    Time spent: 55 minutes    Laqueisha Catalina Loran Rock, DO [facility]  If 7PM-7AM, please contact night-coverage [email or web address] 05/21/2023, 11:09 AM

## 2023-05-21 NOTE — Progress Notes (Signed)
 Pt woke up, seemed more rested and is alert and oriented to self, place, and month. He stated " I need to get out of the bed". This RN and Engineer, manufacturing assisted patient from bed to recliner. Pt is very weak but was able to stand and pivot with 2 person assistance. He carries on conversation with this RN and seems a lot more coherent than previous shifts. He was updated on day, time, and situation. He remembers coming here for a colonoscopy. [place] RN

## 2023-05-21 NOTE — Plan of Care (Signed)
   Problem: Activity: Goal: Risk for activity intolerance will decrease Outcome: Progressing   Problem: Coping: Goal: Level of anxiety will decrease Outcome: Progressing

## 2023-05-21 NOTE — Interval H&P Note (Signed)
 History and Physical Interval Note:  05/21/2023 1:58 PM  Brett Matthews  has presented today for surgery, with the diagnosis of iron deficiency anemia.  The various methods of treatment have been discussed with the patient and family. After consideration of risks, benefits and other options for treatment, the patient has consented to  Procedure(s): EGD (ESOPHAGOGASTRODUODENOSCOPY) (N/A) COLONOSCOPY (N/A) as a surgical intervention.  The patient's history has been reviewed, patient examined, no change in status, stable for surgery.  I have reviewed the patient's chart and labs.  Questions were answered to the patient's satisfaction.    We will proceed with EGD/Colonoscopy as scheduled.  Consent obtained from [name]   I thoroughly discussed with the patient the procedure, including the risks involved. Patient understands what the procedure involves including the benefits and any risks. Patient understands alternatives to the proposed procedure. Risks including (but not limited to) bleeding, tearing of the lining (perforation), rupture of adjacent organs, problems with heart and lung function, infection, and medication reactions. A small percentage of complications may require surgery, hospitalization, repeat endoscopic procedure, and/or transfusion.  Patient understood and agreed.     Forbes Ida Adian Jablonowski

## 2023-05-21 NOTE — Progress Notes (Signed)
 Per reports from nursing, patient is been resting more comfortably and has been more coherent with the last 24 hours.  He reported to the nurse this morning that he remembers coming to the hospital because he needed to have a colonoscopy.  He continues on Cardizem  drip for A-fib however heart rate overall has been stable.  He has had a couple instances of heart rate racing to the 120s however this usually occurred with significant activity like getting out of bed to chair.   Vital signs have overall been stable, slightly hypertensive.  Is on 2 L nasal cannula with saturations 95-97%.  Plans to pursue EGD and colonoscopy today for further workup of his anemia given he shown good clinical improvement over the last several days.  Family and patient were made aware yesterday that Dr. Alita Irwin will be performing procedures in the absence of Dr. Riley Cheadle.  [name], MSN, APRN, FNP-BC, AGACNP-BC [facility] at [facility]

## 2023-05-21 NOTE — Brief Op Note (Signed)
 Patient underwent EGD and Colonoscopy under propofol  sedation.  Tolerated the procedure adequately.   FINDINGS:  Upper endoscopy   - Normal esophagus. - Congestive gastropathy. Biopsied. Clip ( MR conditional) was placed. Clip manufacturer: [employer]. - Gastritis. Biopsied. - Nodular mucosa in the second portion of the duodenum. Biopsied. If this returns as polyp , may recommend EMR as outpatient  Colonoscopy:  - Preparation of the colon was inadequate.  - Stool in the entire examined colon.  - Non- bleeding internal hemorrhoids.  - Diverticulosis in the left colon.  - No specimens collected.  Recommendations:  -Inadequate exam especially on the right colon due to poor prep- solid stools; although there is no evidence of any active or old bleeding seen throughout the examined colon   -Repeat colonoscopy as outpatient for complete exam withe extended bowel prep .   -No absolute GI contraindication to restart clinically indicated anticoagulation ( after 24 hours) while patient is closely monitored  -Nodular mucosa in the second portion of the duodenum. Biopsied. If this returns as polyp , may recommend EMR as outpatient   -IV iron loading while patient is admitted for Iron deficiency anemia  Spoke with patient significant other : Barbara Robbins   Tae Robak Faizan Kelsa Jaworowski, MD Gastroenterology and Hepatology [facility]

## 2023-05-21 NOTE — Anesthesia Preprocedure Evaluation (Signed)
 Anesthesia Evaluation  Patient identified by MRN, date of birth, ID band Patient confused    Reviewed: Allergy & Precautions, H&P , NPO status , Patient's Chart, lab work & pertinent test results, reviewed documented beta blocker date and time , Unable to perform ROS - Chart review only  Airway Mallampati: II  TM Distance: >3 FB Neck ROM: full    Dental no notable dental hx.    Pulmonary neg pulmonary ROS, shortness of breath, pneumonia   Pulmonary exam normal breath sounds clear to auscultation       Cardiovascular Exercise Tolerance: Good hypertension, + CAD  negative cardio ROS + dysrhythmias  Rhythm:regular Rate:Normal     Neuro/Psych  Headaches PSYCHIATRIC DISORDERS      negative neurological ROS  negative psych ROS   GI/Hepatic negative GI ROS, Neg liver ROS,GERD  ,,  Endo/Other  negative endocrine ROSdiabetesHypothyroidism    Renal/GU negative Renal ROS  negative genitourinary   Musculoskeletal   Abdominal   Peds  Hematology negative hematology ROS (+) Blood dyscrasia, anemia   Anesthesia Other Findings   Reproductive/Obstetrics negative OB ROS                             Anesthesia Physical Anesthesia Plan  ASA: 4 and emergent  Anesthesia Plan: General   Post-op Pain Management:    Induction:   PONV Risk Score and Plan: Propofol  infusion  Airway Management Planned:   Additional Equipment:   Intra-op Plan:   Post-operative Plan:   Informed Consent: I have reviewed the patients History and Physical, chart, labs and discussed the procedure including the risks, benefits and alternatives for the proposed anesthesia with the patient or authorized representative who has indicated his/her understanding and acceptance.     Dental Advisory Given  Plan Discussed with: CRNA  Anesthesia Plan Comments:        Anesthesia Quick Evaluation

## 2023-05-21 NOTE — Transfer of Care (Signed)
 Immediate Anesthesia Transfer of Care Note  Patient: Brett Matthews  Procedure(s) Performed: EGD (ESOPHAGOGASTRODUODENOSCOPY) COLONOSCOPY  Patient Location: PACU  Anesthesia Type:General  Level of Consciousness: awake  Airway & Oxygen  Therapy: Patient Spontanous Breathing and Patient connected to nasal cannula oxygen   Post-op Assessment: Report given to RN and Post -op Vital signs reviewed and stable  Post vital signs: Reviewed and stable  Last Vitals:  Vitals Value Taken Time  BP 123/88   Temp 97   Pulse 104 05/21/23 1453  Resp 26 05/21/23 1453  SpO2 96 % 05/21/23 1453  Vitals shown include unfiled device data.  Last Pain:  Vitals:   05/21/23 1410  TempSrc:   PainSc: 0-No pain      Patients Stated Pain Goal: 5 (05/21/23 1350)  Complications: No notable events documented.

## 2023-05-21 NOTE — Op Note (Signed)
 [facility] Patient Name: Brett Matthews Procedure Date: 05/21/2023 1:50 PM MRN: [id number] Date of Birth: Jul 19, 1932 Attending MD: Terril Fetters , MD, [id number] CSN: [id number] Age: 89 Admit Type: Inpatient Procedure:                Upper GI endoscopy Indications:              Acute post hemorrhagic anemia, Unexplained iron                            deficiency anemia Providers:                Terril Fetters, MD, Karyle Pagoda, RN, Sharlette Dayhoff                            Technician, Technician Referring MD:              Medicines:                Monitored Anesthesia Care Complications:            No immediate complications. Estimated Blood Loss:     Estimated blood loss was minimal. Procedure:                Pre-Anesthesia Assessment:                           - Prior to the procedure, a History and Physical                            was performed, and patient medications and                            allergies were reviewed. The patient's tolerance of                            previous anesthesia was also reviewed. The risks                            and benefits of the procedure and the sedation                            options and risks were discussed with the patient.                            All questions were answered, and informed consent                            was obtained. Prior Anticoagulants: The patient has                            taken Xarelto  (rivaroxaban ), last dose was 3 days                            prior to procedure. ASA Grade Assessment: III - A  patient with severe systemic disease. After                            reviewing the risks and benefits, the patient was                            deemed in satisfactory condition to undergo the                            procedure.                           After obtaining informed consent, the endoscope was                            passed under direct vision. Throughout  the                            procedure, the patient's blood pressure, pulse, and                            oxygen  saturations were monitored continuously. The                            [id number]-[id number] ([id number]) scope was introduced through the                            mouth, and advanced to the second part of duodenum.                            The upper GI endoscopy was accomplished without                            difficulty. The patient tolerated the procedure                            well. Scope In: 2:15:47 PM Scope Out: 2:27:36 PM Total Procedure Duration: 0 hours 11 minutes 49 seconds  Findings:      The examined esophagus was normal.      Congested mucosa was found in the cardia. Biopsies were taken with a       cold forceps for histology. For hemostasis, one hemostatic clip was       successfully placed (MR conditional). Clip manufacturer: [employer]. There was no bleeding at the end of the procedure.      Inflammation characterized by congestion (edema) and erythema was found       in the gastric antrum. Biopsies were taken with a cold forceps for       histology.      Nodular mucosa was found in the second portion of the duodenum. Biopsies       were taken with a cold forceps for histology. Impression:               - Normal esophagus.                           -  Congestive gastropathy. Biopsied. Clip (MR                            conditional) was placed. Clip manufacturer: [employer].                           - Gastritis. Biopsied.                           - Nodular mucosa in the second portion of the                            duodenum. Biopsied. If this returns as polyp , may                            recommend EMR as outpatient Moderate Sedation:      Per Anesthesia Care Recommendation:           Proceed with colonoscopy                           No evidence of active or old bleeding . If duodenal                             nodule returns as polyp , may need repeat EGD with                            EMR as outpatient Procedure Code(s):        --- Professional ---                           43255, 59, Esophagogastroduodenoscopy, flexible,                            transoral; with control of bleeding, any method                           43239, Esophagogastroduodenoscopy, flexible,                            transoral; with biopsy, single or multiple Diagnosis Code(s):        --- Professional ---                           K31.89, Other diseases of stomach and duodenum                           K29.70, Gastritis, unspecified, without bleeding                           D62, Acute posthemorrhagic anemia                           D50.9, Iron deficiency anemia, unspecified  CPT copyright 2022 American Medical Association. All rights reserved. The codes documented in this report are preliminary and upon coder review may  be revised to meet current compliance requirements. Terril Fetters, MD Terril Fetters, MD 05/21/2023 2:49:04 PM This report has been signed electronically. Number of Addenda: 0

## 2023-05-21 NOTE — Plan of Care (Signed)

## 2023-05-21 NOTE — Anesthesia Procedure Notes (Signed)
 Date/Time: 05/21/2023 2:21 PM  Performed by: Sherwin Donate, CRNAPre-anesthesia Checklist: Patient identified, Emergency Drugs available, Suction available and Patient being monitored Patient Re-evaluated:Patient Re-evaluated prior to induction Oxygen  Delivery Method: Nasal cannula Induction Type: IV induction Placement Confirmation: positive ETCO2

## 2023-05-21 NOTE — Op Note (Signed)
 [facility] Patient Name: Brett Matthews Procedure Date: 05/21/2023 1:45 PM MRN: [id number] Date of Birth: 03/26/1932 Attending MD: Terril Fetters , MD, [id number] CSN: [id number] Age: 89 Admit Type: Inpatient Procedure:                Colonoscopy Indications:              Acute post hemorrhagic anemia, Iron deficiency                            anemia Providers:                Terril Fetters, MD, Karyle Pagoda, RN, Sharlette Dayhoff                            Technician, Technician Referring MD:              Medicines:                Monitored Anesthesia Care Complications:            No immediate complications. Estimated Blood Loss:     Estimated blood loss: none. Procedure:                Pre-Anesthesia Assessment:                           - Prior to the procedure, a History and Physical                            was performed, and patient medications and                            allergies were reviewed. The patient's tolerance of                            previous anesthesia was also reviewed. The risks                            and benefits of the procedure and the sedation                            options and risks were discussed with the patient.                            All questions were answered, and informed consent                            was obtained. Prior Anticoagulants: The patient has                            taken Xarelto  (rivaroxaban ), last dose was 3 days                            prior to procedure. ASA Grade Assessment: III - A  patient with severe systemic disease. After                            reviewing the risks and benefits, the patient was                            deemed in satisfactory condition to undergo the                            procedure.                           After obtaining informed consent, the colonoscope                            was passed under direct vision. Throughout the                             procedure, the patient's blood pressure, pulse, and                            oxygen  saturations were monitored continuously. The                            [id number]) scope was introduced through                            the anus and advanced to the the cecum, identified                            by the appendiceal orifice. The colonoscopy was                            performed without difficulty. The patient tolerated                            the procedure well. The quality of the bowel                            preparation was evaluated using the BBPS [facility]                            Bowel Preparation Scale) with scores of: Right                            Colon = 1 (portion of mucosa seen, but other areas                            not well seen due to staining, residual stool                            and/or opaque liquid), Transverse Colon = 2 (minor  amount of residual staining, small fragments of                            stool and/or opaque liquid, but mucosa seen well)                            and Left Colon = 2 (minor amount of residual                            staining, small fragments of stool and/or opaque                            liquid, but mucosa seen well). The total BBPS score                            equals 5. The quality of the bowel preparation was                            inadequate. Scope In: 2:32:55 PM Scope Out: 2:45:32 PM Scope Withdrawal Time: 0 hours 7 minutes 58 seconds  Total Procedure Duration: 0 hours 12 minutes 37 seconds  Findings:      The perianal and digital rectal examinations were normal.      Copious quantities of stool was found in the entire colon, interfering       with visualization. Lavage of the area was performed using a large       amount of sterile water , resulting in incomplete clearance with       continued poor visualization.      Non-bleeding internal hemorrhoids were  found during retroflexion. The       hemorrhoids were medium-sized.      There is no endoscopic evidence of bleeding in the entire colon.      Scattered diverticula were found in the left colon. Impression:               - Preparation of the colon was inadequate.                           - Stool in the entire examined colon.                           - Non-bleeding internal hemorrhoids.                           - Diverticulosis in the left colon.                           - No specimens collected. Moderate Sedation:      Per Anesthesia Care Recommendation:           Inadequate exam especially on the right colon due                            to poor prep-solid stools; although there is no  evidence of any active or old bleeding seen                            throughout the examined colon                           Repeat colonoscopy as outpatient for complete exam                            withe extended bowel prep .                           No absolute GI contraindication to restart                            clinically indicated anticoagulation ( after 24                            hours) while patient is closely monitored                           IV iron loading while patient is admitted for Iron                            deficiency anemia Procedure Code(s):        --- Professional ---                           [id number], Colonoscopy, flexible; diagnostic, including                            collection of specimen(s) by brushing or washing,                            when performed (separate procedure) Diagnosis Code(s):        --- Professional ---                           K64.8, Other hemorrhoids                           D62, Acute posthemorrhagic anemia                           D50.9, Iron deficiency anemia, unspecified                           K57.30, Diverticulosis of large intestine without                            perforation or abscess  without bleeding CPT copyright 2022 [employer]. All rights reserved. The codes documented in this report are preliminary and upon coder review may  be revised to meet current compliance requirements. Terril Fetters, MD Terril Fetters, MD 05/21/2023 2:56:56 PM This report has been signed electronically. Number of Addenda: 0

## 2023-05-22 ENCOUNTER — Telehealth: Payer: Self-pay | Admitting: Gastroenterology

## 2023-05-22 ENCOUNTER — Telehealth: Payer: Self-pay | Admitting: Internal Medicine

## 2023-05-22 DIAGNOSIS — D509 Iron deficiency anemia, unspecified: Secondary | ICD-10-CM | POA: Insufficient documentation

## 2023-05-22 DIAGNOSIS — D62 Acute posthemorrhagic anemia: Secondary | ICD-10-CM | POA: Diagnosis not present

## 2023-05-22 LAB — BASIC METABOLIC PANEL WITH GFR
Anion gap: 8 (ref 5–15)
BUN: 8 mg/dL (ref 8–23)
CO2: 25 mmol/L (ref 22–32)
Calcium: 9.1 mg/dL (ref 8.9–10.3)
Chloride: 106 mmol/L (ref 98–111)
Creatinine, Ser: 0.89 mg/dL (ref 0.61–1.24)
GFR, Estimated: >60 mL/min (ref >60)
Glucose, Bld: 148 mg/dL — ABNORMAL HIGH (ref 70–99)
Potassium: 3.9 mmol/L (ref 3.5–5.1)
Sodium: 139 mmol/L (ref 135–145)

## 2023-05-22 LAB — CBC
HCT: 31.4 % — ABNORMAL LOW (ref 39.0–52.0)
Hemoglobin: 9.6 g/dL — ABNORMAL LOW (ref 13.0–17.0)
MCH: 23.5 pg — ABNORMAL LOW (ref 26.0–34.0)
MCHC: 30.6 g/dL (ref 30.0–36.0)
MCV: 76.8 fL — ABNORMAL LOW (ref 80.0–100.0)
Platelets: 270 10*3/uL (ref 150–400)
RBC: 4.09 MIL/uL — ABNORMAL LOW (ref 4.22–5.81)
RDW: 22.5 % — ABNORMAL HIGH (ref 11.5–15.5)
WBC: 7.8 10*3/uL (ref 4.0–10.5)
nRBC: 0 % (ref 0.0–0.2)

## 2023-05-22 LAB — MAGNESIUM: Magnesium: 1.4 mg/dL — ABNORMAL LOW (ref 1.7–2.4)

## 2023-05-22 LAB — GLUCOSE, CAPILLARY
Glucose-Capillary: 162 mg/dL — ABNORMAL HIGH (ref 70–99)
Glucose-Capillary: 268 mg/dL — ABNORMAL HIGH (ref 70–99)
Glucose-Capillary: 185 mg/dL — ABNORMAL HIGH (ref 70–99)

## 2023-05-22 MED ORDER — GABAPENTIN 300 MG PO CAPS: 300.0000 mg | ORAL_CAPSULE | Freq: Three times a day (TID) | ORAL | 0 refills | Status: DC

## 2023-05-22 MED ORDER — CARVEDILOL 25 MG PO TABS: 25.0000 mg | ORAL_TABLET | Freq: Two times a day (BID) | ORAL | 0 refills | Status: AC

## 2023-05-22 MED ORDER — DOCUSATE SODIUM 100 MG PO CAPS: 100.0000 mg | ORAL_CAPSULE | Freq: Two times a day (BID) | ORAL | 2 refills | Status: AC

## 2023-05-22 MED ORDER — RIVAROXABAN 20 MG PO TABS: 20.0000 mg | ORAL_TABLET | Freq: Every day | ORAL | 0 refills | Status: DC

## 2023-05-22 MED ORDER — DILTIAZEM HCL ER COATED BEADS 120 MG PO CP24
120.0000 mg | ORAL_CAPSULE | Freq: Once | ORAL | Status: AC
  Administered 2023-05-22: 120 mg via ORAL
  Filled 2023-05-22: qty 1

## 2023-05-22 MED ORDER — MAGNESIUM SULFATE 2 GM/50ML IV SOLN
2.0000 g | Freq: Once | INTRAVENOUS | Status: AC
  Administered 2023-05-22: 2 g via INTRAVENOUS
  Filled 2023-05-22: qty 50

## 2023-05-22 MED ORDER — FERROUS SULFATE 325 (65 FE) MG PO TBEC: 325.0000 mg | DELAYED_RELEASE_TABLET | Freq: Two times a day (BID) | ORAL | 3 refills | Status: DC

## 2023-05-22 NOTE — Discharge Summary (Signed)
 Physician Discharge Summary  SAVANT VUOLO ZOX:[id number] DOB: [birth year] DOA: 05/15/2023  PCP: Orlena Bitters, MD  Admit date: 05/15/2023  Discharge date: 05/22/2023  Admitted From:Home  Disposition:  Home  Recommendations for Outpatient Follow-up:  Follow up with PCP in 1-2 weeks and follow-up CBC in 1 week IV iron infusion set up outpatient and patient will remain on iron supplementation as recommended per GI as well as Colace Resume home Xarelto  per GI Continue other home medications as prior GI will follow-up for potential repeat colonoscopy in 3 months  Home Health: None  Equipment/Devices: Rolling Stead  Discharge Condition:Stable  CODE STATUS: Full  Diet recommendation: Heart Healthy/carb modified  Brief/Interim Summary: Brett Matthews is a 89 y.o. male atrial fibrillation on chronic anticoagulation with Xarelto , GERD, hypertension, type 2 diabetes with neuropathy, BPH, hypothyroidism, COPD, hyperlipidemia and depression; who presented to the hospital for endoscopy and colonoscopy in order to work his anemia up.  Patient was found with a hemoglobin down to 7.0 and the presence of acute atrial fibrillation with RVR.  Patient was also more short of breath than his baseline and unfortunately unstable for the procedures to be done.  He was ultimately stabilized from a cardiorespiratory standpoint and was on IV Cardizem  drip for improvement in his heart rates.  He did require 2 unit PRBC transfusion.  He was able to safely undergo EGD and colonoscopy on 5/15 with findings of congestive gastropathy status postbiopsy and clip placement given some oozing status postbiopsy.  He also underwent colonoscopy showing some nonbleeding internal hemorrhoids.  GI recommending IV iron infusion as well as supplementation outpatient and follow-up in 3 months for consideration of repeat colonoscopy.  He has been able to ambulate today with no significant concerns noted and heart rates as well as  respiratory rates and oxygenation have remained stable.  His mentation is back to baseline and he is overall doing well.  Hemoglobins have remained stable.  No other acute events or concerns noted.  Of note, patient was noted to have some sundowning and delirium while hospitalized which required brief use of a sitter, but he is now stable and improved and back to baseline mentation.  Discharge Diagnoses:  Principal Problem:   Acute blood loss anemia Active Problems:   Gastritis and gastroduodenitis   Diverticulosis of colon without hemorrhage  Principal discharge diagnosis: Acute blood loss anemia status post 2 unit PRBC transfusion with noted congestive gastropathy as well as internal hemorrhoids on GI exam.  Atrial fibrillation with RVR-resolved.  Discharge Instructions  Discharge Instructions     Amb Referral to Intravenous Iron Therapy   Complete by: As directed    You have been referred to [facility] Infusion team for IV Iron Infusions. The infusion pharmacy team will reach out to you with appointment information.    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   albuterol  108 (90 Base) MCG/ACT inhaler Commonly known as: VENTOLIN  HFA Inhale 2 puffs into the lungs every 4 (four) hours as needed for wheezing or shortness of breath.   atorvastatin  10 MG tablet Commonly known as: LIPITOR  Take 1 tablet (10 mg total) by mouth daily.   benazepril  20  MG tablet Commonly known as: LOTENSIN  Take 20 mg by mouth 2 (two) times daily.   carvedilol  25 MG tablet Commonly known as: COREG  Take 1 tablet (25 mg total) by mouth 2 (two) times daily with a meal.   cholecalciferol  25 MCG (1000 UNIT) tablet Commonly known as: VITAMIN D3 Take 1,000 Units by mouth daily.   citalopram  40 MG tablet Commonly known as: CELEXA  Take 40 mg by mouth daily.   diltiazem  240 MG 24 hr capsule Commonly known as: CARDIZEM  CD Take 240 mg by mouth daily.   docusate sodium  100 MG capsule Commonly known as: Colace Take 1 capsule (100 mg total) by mouth 2 (two) times daily.   famotidine  40 MG tablet Commonly known as: PEPCID  Take 40 mg by mouth daily.   ferrous sulfate  325 (65 FE) MG EC tablet Take 1 tablet (325 mg total) by mouth 2 (two) times daily.   folic acid  1 MG tablet Commonly known as: FOLVITE  Take 1 mg by mouth daily.   Fruit & Vegetable Daily Caps Take 2 capsules by mouth daily.   furosemide 20 MG tablet Commonly known as: LASIX Take 20 mg by mouth daily as needed for fluid or edema.   gabapentin  300 MG capsule Commonly known as: NEURONTIN  Take 1 capsule (300 mg total) by mouth 3 (three) times daily.   glimepiride  2 MG tablet Commonly known as: AMARYL  Take 2 mg by mouth 2 (two) times daily.   hydrALAZINE  50 MG tablet Commonly known as: APRESOLINE  TAKE 1 TABLET BY MOUTH THREE TIMES DAILY   levocetirizine 5 MG tablet Commonly known as: XYZAL Take 5 mg by mouth at bedtime.   levothyroxine  125 MCG tablet Commonly known as: SYNTHROID  Take 125 mcg by mouth daily at 2 am.   Linzess 290 MCG Caps capsule Generic drug: linaclotide Take 290 mcg by mouth daily.   Melatonin 10 MG Tabs Take 10 mg by mouth at bedtime.   metFORMIN  500 MG tablet Commonly known as: GLUCOPHAGE  Take 500-1,000 mg by mouth See admin instructions. Take 2 tabs (1,000mg ) by mouth every morning & 1 tab (500mg ) every evening   mupirocin ointment 2 % Commonly  known as: BACTROBAN 1 Application 2 (two) times daily.   One Daily Multivitamin Men Tabs Take 1 tablet by mouth daily.   PRESERVISION AREDS PO Take 1 tablet by mouth in the morning and at bedtime.   pantoprazole  40 MG tablet Commonly known as: PROTONIX  Take 40 mg by mouth daily at 12 noon.   potassium chloride  10 MEQ tablet Commonly known as: KLOR-CON  M Take 10 mEq by mouth daily.   predniSONE  10 MG tablet Commonly known as: DELTASONE  Take 10 mg by mouth 2 (two) times daily.   rivaroxaban  20 MG Tabs tablet Commonly known as: XARELTO  Take 1 tablet (20 mg total) by mouth daily with supper.   tamsulosin  0.4 MG Caps capsule Commonly known as: FLOMAX  Take 0.4 mg by mouth daily after breakfast.               Durable Medical Equipment  (From admission, onward)           Start     Ordered  05/22/23 1054  For home use only DME [place] rolling  Once       Question Answer Comment  Streety: With 5 Inch Wheels   Patient needs a Matters to treat with the following condition Weakness      05/22/23 1054            Follow-up Information     Vyas, Dhruv B, MD. Schedule an appointment as soon as possible for a visit in 1 week(s).   Specialty: Internal Medicine Contact information: [address] [place] [address] [phone]                No Known Allergies  Consultations: GI   Procedures/Studies: CT ABDOMEN PELVIS W CONTRAST Result Date: 05/19/2023 CLINICAL DATA:  Lower abdominal pain. EXAM: CT ABDOMEN AND PELVIS WITH CONTRAST TECHNIQUE: Multidetector CT imaging of the abdomen and pelvis was performed using the standard protocol following bolus administration of intravenous contrast. Limited exam due to persistent patient motion artifact. RADIATION DOSE REDUCTION: This exam was performed according to the departmental dose-optimization program which includes automated exposure control, adjustment of the mA and/or kV according to patient size and/or use of  iterative reconstruction technique. CONTRAST:  100mL OMNIPAQUE IOHEXOL 300 MG/ML  SOLN COMPARISON:  July 18, 2005. FINDINGS: Lower chest: Small pleural effusions are noted with adjacent subsegmental atelectasis. Hepatobiliary: No focal liver abnormality is seen. No gallstones, gallbladder wall thickening, or biliary dilatation. Pancreas: Unremarkable. No pancreatic ductal dilatation or surrounding inflammatory changes. Spleen: Normal in size without focal abnormality. Adrenals/Urinary Tract: Adrenal glands appear normal. Bilateral renal cysts are noted. No hydronephrosis or renal obstruction is noted. Mild urinary bladder distention is noted. Stomach/Bowel: Stomach is unremarkable. There is no evidence of bowel obstruction or inflammation. Vascular/Lymphatic: Aortic atherosclerosis. No enlarged abdominal or pelvic lymph nodes. Reproductive: Moderate prostatic enlargement is noted. Other: No definite ascites or hernia is noted. Musculoskeletal: Status post kyphoplasty of L1 and L2 vertebral bodies. No acute osseous abnormality is noted. IMPRESSION: Limited exam due to persistent patient motion artifact. Small pleural effusions with adjacent subsegmental atelectasis. Mild urinary bladder distention. Prostatic enlargement. Aortic Atherosclerosis (ICD10-I70.0). Electronically Signed   By: Rosalene Colon M.D.   On: 05/19/2023 12:49   ECHOCARDIOGRAM COMPLETE Result Date: 05/16/2023    ECHOCARDIOGRAM REPORT   Patient Name:   Brett Matthews Date of Exam: 05/16/2023 Medical Rec #:  [id number]       Height:       68.0 in Accession #:    [id number]      Weight:       202.8 lb Date of Birth:  1932-05-10       BSA:          2.056 m Patient Age:    89 years        BP:           109/83 mmHg Patient Gender: M               HR:           85 bpm. Exam [facility] Procedure: 2D Echo, Cardiac Doppler and Color Doppler (Both Spectral and Color            Flow Doppler were utilized during procedure). Indications:    Atrial  fibrillation  History:        Patient has prior history of Echocardiogram examinations, most                 recent  02/05/2020. CAD, Arrythmias:Atrial Fibrillation; Risk                 Factors:Diabetes and Dyslipidemia.  Sonographer:    Juanita Shaw Referring Phys: [phone] CARLOS MADERA IMPRESSIONS  1. Left ventricular ejection fraction, by estimation, is 50 to 55%. The left ventricle has low normal function. The left ventricle has no regional wall motion abnormalities. There is mild concentric left ventricular hypertrophy.  2. Right ventricular systolic function is normal. The right ventricular size is normal. There is mildly elevated pulmonary artery systolic pressure.  3. The mitral valve is normal in structure. No evidence of mitral valve regurgitation. No evidence of mitral stenosis. Moderate mitral annular calcification.  4. Tricuspid valve regurgitation is mild to moderate.  5. The aortic valve is normal in structure. Aortic valve regurgitation is not visualized. No aortic stenosis is present.  6. There is mild dilatation of the aortic root, measuring 39 mm.  7. The inferior vena cava is normal in size with <50% respiratory variability, suggesting right atrial pressure of 8 mmHg. FINDINGS  Left Ventricle: Left ventricular ejection fraction, by estimation, is 50 to 55%. The left ventricle has low normal function. The left ventricle has no regional wall motion abnormalities. The left ventricular internal cavity size was normal in size. There is mild concentric left ventricular hypertrophy. Left ventricular diastolic function could not be evaluated due to mitral annular calcification (moderate or greater). Right Ventricle: The right ventricular size is normal. No increase in right ventricular wall thickness. Right ventricular systolic function is normal. There is mildly elevated pulmonary artery systolic pressure. The tricuspid regurgitant velocity is 2.76  m/s, and with an assumed right atrial pressure of 8 mmHg,  the estimated right ventricular systolic pressure is 38.5 mmHg. Left Atrium: Left atrial size was normal in size. Right Atrium: Right atrial size was normal in size. Pericardium: There is no evidence of pericardial effusion. Mitral Valve: The mitral valve is normal in structure. Moderate mitral annular calcification. No evidence of mitral valve regurgitation. No evidence of mitral valve stenosis. MV peak gradient, 4.0 mmHg. The mean mitral valve gradient is 1.5 mmHg. Tricuspid Valve: The tricuspid valve is normal in structure. Tricuspid valve regurgitation is mild to moderate. No evidence of tricuspid stenosis. Aortic Valve: The aortic valve is normal in structure. Aortic valve regurgitation is not visualized. No aortic stenosis is present. Aortic valve mean gradient measures 2.0 mmHg. Aortic valve peak gradient measures 3.3 mmHg. Aortic valve area, by VTI measures 3.22 cm. Pulmonic Valve: The pulmonic valve was normal in structure. Pulmonic valve regurgitation is not visualized. No evidence of pulmonic stenosis. Aorta: The aortic root is normal in size and structure. There is mild dilatation of the aortic root, measuring 39 mm. Venous: The inferior vena cava is normal in size with less than 50% respiratory variability, suggesting right atrial pressure of 8 mmHg. IAS/Shunts: No atrial level shunt detected by color flow Doppler.  LEFT VENTRICLE PLAX 2D LVIDd:         4.70 cm      Diastology LVIDs:         3.10 cm      LV e' medial:    7.18 cm/s LV PW:         1.10 cm      LV E/e' medial:  10.7 LV IVS:        1.20 cm      LV e' lateral:   10.90 cm/s LVOT diam:     2.20 cm  LV E/e' lateral: 7.1 LV SV:         44 LV SV Index:   22 LVOT Area:     3.80 cm  LV Volumes (MOD) LV vol d, MOD A2C: 104.0 ml LV vol d, MOD A4C: 174.0 ml LV vol s, MOD A2C: 43.8 ml LV vol s, MOD A4C: 55.2 ml LV SV MOD A2C:     60.2 ml LV SV MOD A4C:     174.0 ml LV SV MOD BP:      93.0 ml RIGHT VENTRICLE             IVC RV Basal diam:  4.00 cm      IVC diam: 2.40 cm RV Mid diam:    2.70 cm RV S prime:     10.30 cm/s TAPSE (M-mode): 1.3 cm LEFT ATRIUM             Index        RIGHT ATRIUM           Index LA diam:        4.10 cm 1.99 cm/m   RA Area:     22.30 cm LA Vol (A2C):   26.7 ml 12.99 ml/m  RA Volume:   61.30 ml  29.82 ml/m LA Vol (A4C):   77.6 ml 37.74 ml/m LA Biplane Vol: 49.2 ml 23.93 ml/m  AORTIC VALVE                    PULMONIC VALVE AV Area (Vmax):    3.19 cm     PV Vmax:          0.47 m/s AV Area (Vmean):   2.79 cm     PV Peak grad:     0.9 mmHg AV Area (VTI):     3.22 cm     PR End Diast Vel: 4.84 msec AV Vmax:           90.87 cm/s AV Vmean:          60.033 cm/s AV VTI:            0.138 m AV Peak Grad:      3.3 mmHg AV Mean Grad:      2.0 mmHg LVOT Vmax:         76.30 cm/s LVOT Vmean:        44.100 cm/s LVOT VTI:          0.117 m LVOT/AV VTI ratio: 0.85  AORTA Ao Root diam: 3.90 cm Ao Asc diam:  3.10 cm MITRAL VALVE               TRICUSPID VALVE MV Area (PHT): 3.74 cm    TR Peak grad:   30.5 mmHg MV Area VTI:   2.36 cm    TR Vmax:        276.00 cm/s MV Peak grad:  4.0 mmHg MV Mean grad:  1.5 mmHg    SHUNTS MV Vmax:       1.00 m/s    Systemic VTI:  0.12 m MV Vmean:      57.6 cm/s   Systemic Diam: 2.20 cm MV Decel Time: 203 msec MV E velocity: 77.10 cm/s Kardie Tobb DO Electronically signed by Jerryl Morin DO Signature Date/Time: 05/16/2023/3:06:39 PM    Final      Discharge Exam: Vitals:   05/22/23 0500 05/22/23 0600  BP: (!) 147/95 128/79  Pulse:    Resp:  (!) 21  Temp:  SpO2:     Vitals:   05/22/23 0300 05/22/23 0400 05/22/23 0500 05/22/23 0600  BP: 122/78 113/89 (!) 147/95 128/79  Pulse:      Resp: 16 18  (!) 21  Temp:  97.7 F (36.5 C)    TempSrc:      SpO2:      Weight:      Height:        General: Pt is alert, awake, not in acute distress Cardiovascular: RRR, S1/S2 +, no rubs, no gallops Respiratory: CTA bilaterally, no wheezing, no rhonchi Abdominal: Soft, NT, ND, bowel sounds + Extremities: no  edema, no cyanosis    The results of significant diagnostics from this hospitalization (including imaging, microbiology, ancillary and laboratory) are listed below for reference.     Microbiology: Recent Results (from the past 240 hours)  MRSA Next Gen by PCR, Nasal     Status: None   Collection Time: 05/15/23 12:04 PM   Specimen: Nasal Mucosa; Nasal Swab  Result Value Ref Range Status   MRSA by PCR Next Gen NOT DETECTED NOT DETECTED Final    Comment: (NOTE) The GeneXpert MRSA Assay (FDA approved for NASAL specimens only), is one component of a comprehensive MRSA colonization surveillance program. It is not intended to diagnose MRSA infection nor to guide or monitor treatment for MRSA infections. Test performance is not FDA approved in patients less than 13 years old. Performed at [facility], [address] [place], [place] [address]      Labs: BNP (last 3 results) No results for input(s): "BNP" in the last 8760 hours. Basic Metabolic Panel: Recent Labs  Lab 05/15/23 1209 05/16/23 0533 05/17/23 0607 05/18/23 0903 05/19/23 1031 05/21/23 0410 05/21/23 1823 05/22/23 0447  NA 135 140 131* 133* 137 137  --  139  K 3.2* 2.7* 3.3* 5.2* 4.4 3.3* 3.4* 3.9  CL 105 105 101 102 106 102  --  106  CO2 20* 26 25 22 24 24   --  25  GLUCOSE 107* 89 187* 88 96 160*  --  148*  BUN 9 6* 5* 6* 6* <5*  --  8  CREATININE 0.85 0.87 0.87 0.93 1.03 0.90  --  0.89  CALCIUM  9.1 8.6* 8.4* 9.8 9.5 9.0  --  9.1  MG 1.4* 1.5* 1.8  --   --  1.8  --  1.4*   Liver Function Tests: Recent Labs  Lab 05/15/23 1209  AST 33  ALT 43  ALKPHOS 129*  BILITOT 0.9  PROT 5.8*  ALBUMIN  3.3*   No results for input(s): "LIPASE", "AMYLASE" in the last 168 hours. Recent Labs  Lab 05/20/23 1136  AMMONIA 32   CBC: Recent Labs  Lab 05/17/23 0607 05/18/23 0903 05/19/23 1031 05/21/23 0410 05/22/23 0447  WBC 7.0 10.2 9.5 9.0 7.8  NEUTROABS  --  8.3*  --   --   --   HGB 8.4* 9.3* 9.3* 10.0* 9.6*   HCT 28.9* 32.3* 30.6* 32.2* 31.4*  MCV 77.3* 78.8* 77.3* 75.4* 76.8*  PLT 249 297 257 269 270   Cardiac Enzymes: No results for input(s): "CKTOTAL", "CKMB", "CKMBINDEX", "TROPONINI" in the last 168 hours. BNP: Invalid input(s): "POCBNP" CBG: Recent Labs  Lab 05/21/23 1134 05/21/23 1326 05/21/23 1604 05/21/23 2116 05/22/23 0736  GLUCAP 156* 185* 164* 196* 162*   D-Dimer No results for input(s): "DDIMER" in the last 72 hours. Hgb A1c No results for input(s): "HGBA1C" in the last 72 hours. Lipid Profile No results for input(s): "  CHOL", "HDL", "LDLCALC", "TRIG", "CHOLHDL", "LDLDIRECT" in the last 72 hours. Thyroid  function studies No results for input(s): "TSH", "T4TOTAL", "T3FREE", "THYROIDAB" in the last 72 hours.  Invalid input(s): "FREET3" Anemia work up No results for input(s): "VITAMINB12", "FOLATE", "FERRITIN", "TIBC", "IRON", "RETICCTPCT" in the last 72 hours. Urinalysis    Component Value Date/Time   COLORURINE YELLOW 08/03/2021 2206   APPEARANCEUR CLEAR 08/03/2021 2206   LABSPEC 1.009 08/03/2021 2206   PHURINE 8.0 08/03/2021 2206   GLUCOSEU 50 (A) 08/03/2021 2206   HGBUR SMALL (A) 08/03/2021 2206   BILIRUBINUR NEGATIVE 08/03/2021 2206   KETONESUR NEGATIVE 08/03/2021 2206   PROTEINUR NEGATIVE 08/03/2021 2206   NITRITE NEGATIVE 08/03/2021 2206   LEUKOCYTESUR TRACE (A) 08/03/2021 2206   Sepsis Labs Recent Labs  Lab 05/18/23 0903 05/19/23 1031 05/21/23 0410 05/22/23 0447  WBC 10.2 9.5 9.0 7.8   Microbiology Recent Results (from the past 240 hours)  MRSA Next Gen by PCR, Nasal     Status: None   Collection Time: 05/15/23 12:04 PM   Specimen: Nasal Mucosa; Nasal Swab  Result Value Ref Range Status   MRSA by PCR Next Gen NOT DETECTED NOT DETECTED Final    Comment: (NOTE) The GeneXpert MRSA Assay (FDA approved for NASAL specimens only), is one component of a comprehensive MRSA colonization surveillance program. It is not intended to diagnose MRSA  infection nor to guide or monitor treatment for MRSA infections. Test performance is not FDA approved in patients less than 75 years old. Performed at [facility], [address] [place], [place] [address]      Time coordinating discharge: 35 minutes  SIGNED:   Cornelius Dill, DO [facility] 05/22/2023, 10:55 AM  If 7PM-7AM, please contact night-coverage [email or web address]

## 2023-05-22 NOTE — Telephone Encounter (Signed)
 Pt has been scheduled.

## 2023-05-22 NOTE — Telephone Encounter (Signed)
 Please arrange hospital follow-up for the patient in 3-4 weeks, patient has almost directly followed with Dr. Riley Cheadle for the last 2 years, please try to make follow-up with him (week of 6/10-6/13) but if no availability can be with myself, Antony Baumgartner, or Starling Eck if patient/wife agreeable.  [name], MSN, APRN, FNP-BC, AGACNP-BC [facility] Gastroenterology at [facility]

## 2023-05-22 NOTE — Care Management Important Message (Signed)
 Important Message  Patient Details  Name: Brett Matthews MRN: [id number] Date of Birth: 27-Jan-1932   Important Message Given:  Yes - Medicare IM     [name] [name] 05/22/2023, 12:31 PM

## 2023-05-22 NOTE — Telephone Encounter (Signed)
 Patient referred to infusion pharmacy team for ambulatory infusion of IV iron.  Insurance - [employer]  Dx code - D50.8  IV Iron Therapy - Feraheme 510 mg IV x 2  Infusion appointments - [facility] scheduling team will schedule patient as soon as possible.    Alpha Mysliwiec D. Suellen Durocher, PharmD

## 2023-05-22 NOTE — Telephone Encounter (Signed)
 Please arrange CBC for patient in 1 week. Dx: anemia  [name], MSN, APRN, FNP-BC, AGACNP-BC [facility] Gastroenterology at [facility]

## 2023-05-22 NOTE — Plan of Care (Signed)
  Problem: Education: Goal: Ability to describe self-care measures that may prevent or decrease complications (Diabetes Survival Skills Education) will improve Outcome: Progressing   Problem: Nutritional: Goal: Maintenance of adequate nutrition will improve Outcome: Progressing   Problem: Education: Goal: Knowledge of General Education information will improve Description: Including pain rating scale, medication(s)/side effects and non-pharmacologic comfort measures Outcome: Progressing   Problem: Activity: Goal: Risk for activity intolerance will decrease Outcome: Progressing

## 2023-05-22 NOTE — TOC Transition Note (Signed)
 Transition of Care [facility]) - Discharge Note   Patient Details  Name: Brett Matthews MRN: [id number] Date of Birth: 1932/11/22  [facility] of [facility]) CM/SW Contact:  Grandville Lax, LCSWA Phone Number: 05/22/2023, 11:30 AM   Clinical Narrative:    CSW updated that pt is medically stable for D/C home today. MD placed DME order for rolling Doris. CSW met with pt at bedside who states he has needed DME in the home and is independent in completing his ADLs. TOC signing off.   Final next level of care: Home/Self Care Barriers to Discharge: Barriers Resolved   Patient Goals and CMS Choice Patient states their goals for this hospitalization and ongoing recovery are:: return home CMS Medicare.gov Compare Post Acute Care list provided to:: Patient Choice offered to / list presented to : Patient      Discharge Placement                       Discharge Plan and Services Additional resources added to the After Visit Summary for                                       Social Drivers of Health (SDOH) Interventions SDOH Screenings   Food Insecurity: Patient Unable To Answer (05/18/2023)  Housing: Patient Unable To Answer (05/18/2023)  Transportation Needs: Patient Unable To Answer (05/18/2023)  Utilities: Patient Unable To Answer (05/18/2023)  Financial Resource Strain: Low Risk  (04/30/2023)   [facility]  Physical Activity: Inactive (04/30/2023)   [facility]  Social Connections: Unknown (05/18/2023)  Recent Concern: Social Connections - Socially Isolated (04/30/2023)   Received from [facility]  Stress: No Stress Concern Present (04/30/2023)   Received from [facility]  Tobacco Use: Low Risk  (05/15/2023)  Recent Concern: Tobacco Use - Medium Risk (04/30/2023)   Received from [facility]  Health Literacy: Medium Risk (04/30/2023)   Received from [facility]     Readmission Risk Interventions     No data to display

## 2023-05-22 NOTE — Anesthesia Postprocedure Evaluation (Signed)
 Anesthesia Post Note  Patient: Brett Matthews  Procedure(s) Performed: EGD (ESOPHAGOGASTRODUODENOSCOPY) COLONOSCOPY  Patient location during evaluation: Phase II Anesthesia Type: General Level of consciousness: awake Pain management: pain level controlled Vital Signs Assessment: post-procedure vital signs reviewed and stable Respiratory status: spontaneous breathing and respiratory function stable Cardiovascular status: blood pressure returned to baseline and stable Postop Assessment: no headache and no apparent nausea or vomiting Anesthetic complications: no Comments: Late entry   No notable events documented.   Last Vitals:  Vitals:   05/22/23 1400 05/22/23 1509  BP:    Pulse:    Resp: 18   Temp:  36.7 C  SpO2:      Last Pain:  Vitals:   05/22/23 1509  TempSrc: Oral  PainSc:                  Coretha Dew

## 2023-05-22 NOTE — Progress Notes (Signed)
 GI has been following patient since admission given he presented outpatient originally for EGD and colonoscopy for evaluation of anemia and was found to have A-fib with RVR likely exacerbated by his anemia.  Currently remains in the ICU on antiarrhythmic medication for his A-fib.  Patient not seen or examined today.  EGD and colonoscopy performed yesterday.  EGD with normal esophagus and congestive gastropathy s/p biopsy and nodular mucosa s/p biopsy with clip placement given there was some oozing post-biopsy.  Colonoscopy with poor prep especially in the right colon.  Nonbleeding internal hemorrhoids and left-sided diverticulosis noted.  No specimens collected.  No evidence of any active or old bleeding noted throughout the examined colon.  May need to consider outpatient repeat exam.     Recommendations: - Consider repeat colonoscopy in 3 months if stable from cardiac standpoint -May resume anticoagulation if indicated -Will need close monitoring outpatient of hemoglobin -May need to consider IV iron outpatient, Dr. Alita Irwin has recommended IV iron administration prior to discharge. - Would recommend daily oral iron supplementation outpatient as well  We will arrange outpatient follow-up in a couple weeks and recheck hemoglobin next week.  GI will sign off on patient today given procedures have been completed.  Discharge criteria to be determined by hospitalist/cardiology.  I further relayed my recommendations to the hospitalist today, Dr. Mason Sole.   [name], MSN, APRN, FNP-BC, AGACNP-BC [facility]

## 2023-05-25 ENCOUNTER — Telehealth: Payer: Self-pay

## 2023-05-25 LAB — SURGICAL PATHOLOGY

## 2023-05-25 NOTE — Telephone Encounter (Signed)
 Auth Submission: APPROVED Site of care: Site of care: [facility] Payer: [employer] Medication & CPT/J Code(s) submitted: Feraheme (ferumoxytol) [id number] Route of submission (phone, fax, portal): fax Phone # Fax # [phone]  Auth type: Buy/Bill PB Units/visits requested: 510mg  x 2 doses Reference number: [id number] Approval from: 05/22/23 to 08/20/23

## 2023-05-26 ENCOUNTER — Encounter (HOSPITAL_COMMUNITY): Payer: Self-pay | Admitting: Gastroenterology

## 2023-05-27 ENCOUNTER — Ambulatory Visit (INDEPENDENT_AMBULATORY_CARE_PROVIDER_SITE_OTHER): Payer: Self-pay | Admitting: Gastroenterology

## 2023-05-27 LAB — GLUCOSE, CAPILLARY: Glucose-Capillary: 180 mg/dL — ABNORMAL HIGH (ref 70–99)

## 2023-05-28 DIAGNOSIS — D649 Anemia, unspecified: Secondary | ICD-10-CM | POA: Diagnosis not present

## 2023-06-03 NOTE — Telephone Encounter (Signed)
 LMOM for pt to call office

## 2023-06-04 ENCOUNTER — Other Ambulatory Visit: Payer: Self-pay | Admitting: *Deleted

## 2023-06-04 ENCOUNTER — Encounter: Attending: Gastroenterology | Admitting: Emergency Medicine

## 2023-06-04 ENCOUNTER — Other Ambulatory Visit: Payer: Self-pay | Admitting: Emergency Medicine

## 2023-06-04 ENCOUNTER — Ambulatory Visit

## 2023-06-04 VITALS — BP 132/51 | HR 60 | Temp 98.1°F | Resp 16

## 2023-06-04 DIAGNOSIS — D649 Anemia, unspecified: Secondary | ICD-10-CM

## 2023-06-04 DIAGNOSIS — L84 Corns and callosities: Secondary | ICD-10-CM | POA: Diagnosis not present

## 2023-06-04 DIAGNOSIS — B351 Tinea unguium: Secondary | ICD-10-CM | POA: Diagnosis not present

## 2023-06-04 DIAGNOSIS — M79674 Pain in right toe(s): Secondary | ICD-10-CM | POA: Diagnosis not present

## 2023-06-04 DIAGNOSIS — D62 Acute posthemorrhagic anemia: Secondary | ICD-10-CM

## 2023-06-04 DIAGNOSIS — D509 Iron deficiency anemia, unspecified: Secondary | ICD-10-CM | POA: Diagnosis not present

## 2023-06-04 DIAGNOSIS — M79675 Pain in left toe(s): Secondary | ICD-10-CM | POA: Diagnosis not present

## 2023-06-04 DIAGNOSIS — E1142 Type 2 diabetes mellitus with diabetic polyneuropathy: Secondary | ICD-10-CM | POA: Diagnosis not present

## 2023-06-04 LAB — CBC WITH DIFFERENTIAL/PLATELET
Absolute Lymphocytes: 792 {cells}/uL — ABNORMAL LOW (ref 850–3900)
Absolute Monocytes: 657 {cells}/uL (ref 200–950)
Basophils Absolute: 99 {cells}/uL (ref 0–200)
Basophils Relative: 1.1 %
Eosinophils Absolute: 279 {cells}/uL (ref 15–500)
Eosinophils Relative: 3.1 %
HCT: 28.1 % — ABNORMAL LOW (ref 38.5–50.0)
Hemoglobin: 8.2 g/dL — ABNORMAL LOW (ref 13.2–17.1)
MCH: 23.3 pg — ABNORMAL LOW (ref 27.0–33.0)
MCHC: 29.2 g/dL — ABNORMAL LOW (ref 32.0–36.0)
MCV: 79.8 fL — ABNORMAL LOW (ref 80.0–100.0)
MPV: 9.3 fL (ref 7.5–12.5)
Monocytes Relative: 7.3 %
Neutro Abs: 7173 {cells}/uL (ref 1500–7800)
Neutrophils Relative %: 79.7 %
Platelets: 450 10*3/uL — ABNORMAL HIGH (ref 140–400)
RBC: 3.52 10*6/uL — ABNORMAL LOW (ref 4.20–5.80)
RDW: 19.6 % — ABNORMAL HIGH (ref 11.0–15.0)
Total Lymphocyte: 8.8 %
WBC: 9 10*3/uL (ref 3.8–10.8)

## 2023-06-04 MED ORDER — ACETAMINOPHEN 325 MG PO TABS
650.0000 mg | ORAL_TABLET | Freq: Once | ORAL | Status: AC
  Administered 2023-06-04: 650 mg via ORAL

## 2023-06-04 MED ORDER — SODIUM CHLORIDE 0.9 % IV SOLN
510.0000 mg | Freq: Once | INTRAVENOUS | Status: AC
  Administered 2023-06-04: 510 mg via INTRAVENOUS
  Filled 2023-06-04: qty 17

## 2023-06-04 MED ORDER — DIPHENHYDRAMINE HCL 25 MG PO CAPS
25.0000 mg | ORAL_CAPSULE | Freq: Once | ORAL | Status: AC
  Administered 2023-06-04: 25 mg via ORAL

## 2023-06-04 NOTE — Progress Notes (Signed)
 Diagnosis: Iron Deficiency Anemia  Provider:  Samantha Cress MD  Procedure: IV Infusion  IV Type: Peripheral, IV Location: L Forearm  Feraheme (Ferumoxytol), Dose: 510 mg  Infusion Start Time: 1134  Infusion Stop Time: 1152  Post Infusion IV Care: Observation period completed and Peripheral IV Discontinued  Discharge: Condition: Good, Destination: Home . AVS Provided  Performed by:  Rico Charters, RN

## 2023-06-04 NOTE — Telephone Encounter (Signed)
 Spoke to pt's wife (DPR) informed her of recommendations. She voiced understanding. She states she will take him today.

## 2023-06-05 ENCOUNTER — Ambulatory Visit: Payer: Self-pay | Admitting: Gastroenterology

## 2023-06-07 NOTE — Progress Notes (Unsigned)
 Brett Matthews, male    DOB: 1932/11/14    MRN: [id number]   Brief patient profile:  57   yowm  never smoker  referred to pulmonary clinic in [place]  06/10/2023 by [facility]  for doe/ orthopnea ever  since covid but never admitted  and echo min [facility] 05/26/23    History of Present Illness  06/10/2023  Pulmonary/ 1st office eval/ Brett Matthews / [facility]  Chief Complaint  Patient presents with   Establish Care  Dyspnea: uses  2 wheeled [place], church and doctor's office  Cough: sporadic but not present at awakening nor noct Sleep: flat bed/ bunch of pillows or recliner sx 45 degrees SABA use: seem to help 02: none     No obvious day to day or daytime pattern/variability or assoc excess/ purulent sputum or mucus plugs or hemoptysis or cp or chest tightness, subjective wheeze or overt sinus or hb symptoms.    Also denies any obvious fluctuation of symptoms with weather or environmental changes or other aggravating or alleviating factors except as outlined above   No unusual exposure hx or h/o childhood pna/ asthma or knowledge of premature birth.  Current Allergies, Complete Past Medical History, Past Surgical History, Family History, and Social History were reviewed in [employer] record.  ROS  The following are not active complaints unless bolded Hoarseness, sore throat, dysphagia, dental problems, itching, sneezing,  nasal congestion or discharge of excess mucus or purulent secretions, ear ache,   fever, chills, sweats, unintended wt loss or wt gain, classically pleuritic or exertional cp,  orthopnea pnd or arm/hand swelling  or leg swelling, presyncope, palpitations, abdominal pain, anorexia, nausea, vomiting, diarrhea  or change in bowel habits or change in bladder habits, change in stools or change in urine, dysuria, hematuria,  rash, arthralgias, visual complaints, headache, numbness, weakness or ataxia or problems with walking or coordination,  change in mood or   memory.            Outpatient Medications Prior to Visit  Medication Sig Dispense Refill   acetaminophen  (TYLENOL ) 500 MG tablet Take 2 tablets (1,000 mg total) by mouth every 6 (six) hours as needed for mild pain or moderate pain. 60 tablet 0   albuterol  (VENTOLIN  HFA) 108 (90 Base) MCG/ACT inhaler Inhale 2 puffs into the lungs every 4 (four) hours as needed for wheezing or shortness of breath.     atorvastatin  (LIPITOR ) 10 MG tablet Take 1 tablet (10 mg total) by mouth daily. 90 tablet 2   carvedilol  (COREG ) 25 MG tablet Take 1 tablet (25 mg total) by mouth 2 (two) times daily with a meal. 60 tablet 0   cholecalciferol  (VITAMIN D3) 25 MCG (1000 UNIT) tablet Take 1,000 Units by mouth daily.     citalopram  (CELEXA ) 40 MG tablet Take 40 mg by mouth daily.     diltiazem  (CARDIZEM  CD) 240 MG 24 hr capsule Take 240 mg by mouth daily.     docusate sodium  (COLACE) 100 MG capsule Take 1 capsule (100 mg total) by mouth 2 (two) times daily. 60 capsule 2   famotidine  (PEPCID ) 40 MG tablet Take 40 mg by mouth daily.     ferrous sulfate  325 (65 FE) MG EC tablet Take 1 tablet (325 mg total) by mouth 2 (two) times daily. 60 tablet 3   folic acid  (FOLVITE ) 1 MG tablet Take 1 mg by mouth daily.     furosemide  (LASIX ) 20 MG tablet Take 20 mg by mouth  daily as needed for fluid or edema.     gabapentin  (NEURONTIN ) 300 MG capsule Take 1 capsule (300 mg total) by mouth 3 (three) times daily. 90 capsule 0   glimepiride  (AMARYL ) 2 MG tablet Take 2 mg by mouth 2 (two) times daily.  9   hydrALAZINE  (APRESOLINE ) 50 MG tablet TAKE 1 TABLET BY MOUTH THREE TIMES DAILY 270 tablet 0   levocetirizine (XYZAL) 5 MG tablet Take 5 mg by mouth at bedtime.     levothyroxine  (SYNTHROID ) 125 MCG tablet Take 125 mcg by mouth daily at 2 am.     LINZESS  290 MCG CAPS capsule Take 290 mcg by mouth daily.     Melatonin 10 MG TABS Take 10 mg by mouth at bedtime.     metFORMIN  (GLUCOPHAGE ) 500 MG tablet Take 500-1,000 mg by mouth See  admin instructions. Take 2 tabs (1,000mg ) by mouth every morning & 1 tab (500mg ) every evening     Multiple Vitamins-Minerals (ONE DAILY MULTIVITAMIN MEN) TABS Take 1 tablet by mouth daily.     Multiple Vitamins-Minerals (PRESERVISION AREDS PO) Take 1 tablet by mouth in the morning and at bedtime.     mupirocin ointment (BACTROBAN) 2 % 1 Application 2 (two) times daily.     Nutritional Supplements (FRUIT & VEGETABLE DAILY) CAPS Take 2 capsules by mouth daily.     pantoprazole  (PROTONIX ) 40 MG tablet Take 40 mg by mouth daily at 12 noon.     potassium chloride  (KLOR-CON  M) 10 MEQ tablet Take 10 mEq by mouth daily.     potassium chloride  (KLOR-CON ) 10 MEQ tablet Take 10 mEq by mouth daily.     predniSONE  (DELTASONE ) 10 MG tablet Take 10 mg by mouth 2 (two) times daily.     tamsulosin  (FLOMAX ) 0.4 MG CAPS capsule Take 0.4 mg by mouth daily after breakfast.     benazepril  (LOTENSIN ) 20 MG tablet Take 20 mg by mouth 2 (two) times daily.     rivaroxaban  (XARELTO ) 20 MG TABS tablet Take 1 tablet (20 mg total) by mouth daily with supper. 30 tablet 0   No facility-administered medications prior to visit.    Past Medical History:  Diagnosis Date   Anemia, iron deficiency    Negative capsule endoscopy   Arthritis    Coronary atherosclerosis of native coronary artery    60 % circumflex stenosis; EF 65%, Cath 6/13   Diverticula of colon    Pancolonic   DM (diabetes mellitus), type 2, uncontrolled    Dyspnea    Normal cardiopulmonary function test in July 2008, negative echocardiogram with "bubble" study for inter-cardiac shunt.   Dysrhythmia    Essential hypertension, benign    Gastroesophageal reflux disease    Hemorrhoids    Hyperplastic colon polyp 04/06/2007   Hypothyroidism 2003   Status post total thyroidectomy   Memory difficulties    Migraines    Neoplasm of lymphatic and hematopoietic tissue    Paroxysmal atrial fibrillation (HCC)    Diagnosed 2005   Skin cancer        Objective:     BP (!) 160/72 (BP Location: Left Arm)   Pulse 78   Ht 5\' 8"  (1.727 m)   Wt 186 lb 3.2 oz (84.5 kg)   SpO2 92% Comment: RA  BMI 28.31 kg/m   SpO2: 92 % (RA)  Pleasant elderly wm 2 wheeled [place]      HEENT : Oropharynx  clear      Nasal turbinates nl  NECK :  without  apparent JVD/ palpable Nodes/TM    LUNGS: no acc muscle use,  Nl contour chest which is clear to A and P bilaterally without cough on insp or exp maneuvers   CV:  RRR  no s3 or  2/6 SEM s  increase in P2, and  L > R LE pitting edema  ABD:  soft and nontender   MS:   ext warm without deformities Or obvious joint restrictions  calf tenderness, cyanosis or clubbing    SKIN: warm and dry without lesions    NEURO:  alert, approp, nl sensorium with  no motor or cerebellar deficits apparent.      CXR PA and Lateral:   06/10/2023 :    I personally reviewed images and impression is as follows:     Cm/ mild kyphosis/ no acute findings    Assessment   DOE (dyspnea on exertion) Symptoms are markedly disproportionate to objective findings and not clear to what extent this is actually a pulmonary  problem but pt does appear to have difficult to sort out respiratory symptoms of unknown origin for which  DDX  = almost all start with A and  include Adherence, Ace Inhibitors, Acid Reflux, Active Sinus Disease, Alpha 1 Antitripsin deficiency, Anxiety masquerading as Airways dz,  ABPA,  Allergy(esp in young), Aspiration (esp in elderly), Adverse effects of meds,  Active smoking or Vaping, A bunch of PE's/clot burden (a few small clots can't cause this syndrome unless there is already severe underlying pulm or vascular dz with poor reserve),  Anemia or thyroid  disorder, plus two Bs  = Bronchiectasis and Beta blocker use..and one C= CHF    Bolded dx's most likely   Leading the pack: ACEi adverse effects at the  top of the usual list of suspects and the only way to rule it out is a trial off > see a/p     ? Acid (or non-acid) GERD > always difficult to exclude as up to 75% of pts in some series report no assoc GI/ Heartburn symptoms> rec continue max (24h)  acid suppression and diet restrictions/ reviewed     ? Chf/ diastolic dysfunction > beck bnp and other labs w/a   Essential hypertension D/c ACEi 06/10/2023 for unexplained sob   In the best review of chronic cough to date ( NEJM 2016 375 9629-5284) ,  ACEi are now felt to cause cough in up to  20% of pts which is a 4 fold increase from previous reports and does not include the variety of non-specific complaints we see in pulmonary clinic in pts on ACEi but previously attributed to another dx like  Copd/asthma and  include PNDS, throat and chest congestion, "bronchitis", unexplained dyspnea and noct "strangling" sensations, and hoarseness, but also  atypical /refractory GERD symptoms like dysphagia and "bad heartburn"   The only way I know  to prove this is not an "ACEi Case" is a trial off ACEi x a minimum of 6 weeks then regroup. > try olmesartan  20 mg daily and f/u in 2 weeks  Discussed in detail all the  indications, usual  risks and alternatives  relative to the benefits with patient who agrees to proceed with Rx as outlined.             Each maintenance medication was reviewed in detail including emphasizing most importantly the difference between maintenance and prns and under what circumstances the prns are to be triggered using an action plan format  where appropriate.  Total time for H and P, chart review, counseling,  and generating customized AVS unique to this office visit / same day charting = 32 min new pt eval           Vernestine Gondola, MD 06/10/2023

## 2023-06-08 DIAGNOSIS — Z299 Encounter for prophylactic measures, unspecified: Secondary | ICD-10-CM | POA: Diagnosis not present

## 2023-06-08 DIAGNOSIS — G4700 Insomnia, unspecified: Secondary | ICD-10-CM | POA: Diagnosis not present

## 2023-06-08 DIAGNOSIS — I10 Essential (primary) hypertension: Secondary | ICD-10-CM | POA: Diagnosis not present

## 2023-06-08 DIAGNOSIS — I5032 Chronic diastolic (congestive) heart failure: Secondary | ICD-10-CM | POA: Diagnosis not present

## 2023-06-08 DIAGNOSIS — R600 Localized edema: Secondary | ICD-10-CM | POA: Diagnosis not present

## 2023-06-10 ENCOUNTER — Encounter: Payer: Self-pay | Admitting: Internal Medicine

## 2023-06-10 ENCOUNTER — Ambulatory Visit (HOSPITAL_COMMUNITY)
Admission: RE | Admit: 2023-06-10 | Discharge: 2023-06-10 | Disposition: A | Discharge status: Home / Self Care | Source: Ambulatory Visit | Attending: Internal Medicine | Admitting: Internal Medicine

## 2023-06-10 ENCOUNTER — Ambulatory Visit: Admitting: Internal Medicine

## 2023-06-10 VITALS — BP 160/72 | HR 78 | Ht 68.0 in | Wt 186.2 lb

## 2023-06-10 DIAGNOSIS — R0609 Other forms of dyspnea: Secondary | ICD-10-CM | POA: Diagnosis not present

## 2023-06-10 DIAGNOSIS — I700 Atherosclerosis of aorta: Secondary | ICD-10-CM | POA: Diagnosis not present

## 2023-06-10 DIAGNOSIS — I10 Essential (primary) hypertension: Secondary | ICD-10-CM | POA: Diagnosis not present

## 2023-06-10 DIAGNOSIS — R0602 Shortness of breath: Secondary | ICD-10-CM | POA: Diagnosis not present

## 2023-06-10 DIAGNOSIS — J9811 Atelectasis: Secondary | ICD-10-CM | POA: Diagnosis not present

## 2023-06-10 DIAGNOSIS — I517 Cardiomegaly: Secondary | ICD-10-CM | POA: Diagnosis not present

## 2023-06-10 MED ORDER — OLMESARTAN MEDOXOMIL 20 MG PO TABS: 20.0000 mg | ORAL_TABLET | Freq: Every day | ORAL | 11 refills | Status: AC

## 2023-06-10 NOTE — Assessment & Plan Note (Addendum)
 Symptoms are markedly disproportionate to objective findings and not clear to what extent this is actually a pulmonary  problem but pt does appear to have difficult to sort out respiratory symptoms of unknown origin for which  DDX  = almost all start with A and  include Adherence, Ace Inhibitors, Acid Reflux, Active Sinus Disease, Alpha 1 Antitripsin deficiency, Anxiety masquerading as Airways dz,  ABPA,  Allergy(esp in young), Aspiration (esp in elderly), Adverse effects of meds,  Active smoking or Vaping, A bunch of PE's/clot burden (a few small clots can't cause this syndrome unless there is already severe underlying pulm or vascular dz with poor reserve),  Anemia or thyroid  disorder, plus two Bs  = Bronchiectasis and Beta blocker use..and one C= CHF    Bolded dx's most likely   Leading the pack: ACEi adverse effects at the  top of the usual list of suspects and the only way to rule it out is a trial off > see a/p    ? Acid (or non-acid) GERD > always difficult to exclude as up to 75% of pts in some series report no assoc GI/ Heartburn symptoms> rec continue max (24h)  acid suppression and diet restrictions/ reviewed     ? Chf/ diastolic dysfunction > beck bnp and other labs w/a

## 2023-06-10 NOTE — Assessment & Plan Note (Addendum)
 D/c ACEi 06/10/2023 for unexplained sob   In the best review of chronic cough to date ( NEJM 2016 375 1610-9604) ,  ACEi are now felt to cause cough in up to  20% of pts which is a 4 fold increase from previous reports and does not include the variety of non-specific complaints we see in pulmonary clinic in pts on ACEi but previously attributed to another dx like  Copd/asthma and  include PNDS, throat and chest congestion, "bronchitis", unexplained dyspnea and noct "strangling" sensations, and hoarseness, but also  atypical /refractory GERD symptoms like dysphagia and "bad heartburn"   The only way I know  to prove this is not an "ACEi Case" is a trial off ACEi x a minimum of 6 weeks then regroup. > try olmesartan  20 mg daily and f/u in 2 weeks  Discussed in detail all the  indications, usual  risks and alternatives  relative to the benefits with patient who agrees to proceed with Rx as outlined.             Each maintenance medication was reviewed in detail including emphasizing most importantly the difference between maintenance and prns and under what circumstances the prns are to be triggered using an action plan format where appropriate.  Total time for H and P, chart review, counseling,  and generating customized AVS unique to this office visit / same day charting = 32 min new pt eval

## 2023-06-10 NOTE — Patient Instructions (Addendum)
 Heart ultrasound is also called an echo and was just done in hospital [facility]) and looks ok   Stop lotensin  and replace with olmesartan  20 mg one  each am   Please remember to go to the lab department   for your tests - we will call you with the results when they are available.     Please remember to go to the  x-ray department  @  [facility] for your tests - we will call you with the results when they are available     Please schedule a follow up office visit in 6 weeks, call sooner if needed

## 2023-06-11 ENCOUNTER — Ambulatory Visit: Payer: Self-pay | Admitting: Internal Medicine

## 2023-06-11 ENCOUNTER — Encounter: Attending: Gastroenterology | Admitting: Emergency Medicine

## 2023-06-11 VITALS — BP 112/57 | HR 68 | Temp 97.8°F | Resp 16

## 2023-06-11 DIAGNOSIS — D62 Acute posthemorrhagic anemia: Secondary | ICD-10-CM | POA: Insufficient documentation

## 2023-06-11 DIAGNOSIS — D509 Iron deficiency anemia, unspecified: Secondary | ICD-10-CM | POA: Diagnosis not present

## 2023-06-11 LAB — BASIC METABOLIC PANEL WITH GFR
BUN/Creatinine Ratio: 13 (ref 10–24)
BUN: 11 mg/dL (ref 8–27)
CO2: 23 mmol/L (ref 20–29)
Calcium: 9.5 mg/dL (ref 8.6–10.2)
Chloride: 100 mmol/L (ref 96–106)
Creatinine, Ser: 0.88 mg/dL (ref 0.76–1.27)
Glucose: 151 mg/dL — ABNORMAL HIGH (ref 70–99)
Potassium: 3.6 mmol/L (ref 3.5–5.2)
Sodium: 139 mmol/L (ref 134–144)
eGFR: 82 mL/min/{1.73_m2} (ref >59)

## 2023-06-11 LAB — HEPATIC FUNCTION PANEL
ALT: 25 IU/L (ref 0–44)
AST: 26 IU/L (ref 0–40)
Albumin: 3.9 g/dL (ref 3.7–4.7)
Alkaline Phosphatase: 154 IU/L — ABNORMAL HIGH (ref 44–121)
Bilirubin Total: 0.6 mg/dL (ref 0.0–1.2)
Bilirubin, Direct: 0.25 mg/dL (ref 0.00–0.40)
Total Protein: 6.1 g/dL (ref 6.0–8.5)

## 2023-06-11 LAB — CBC WITH DIFFERENTIAL/PLATELET
Basophils Absolute: 0.1 10*3/uL (ref 0.0–0.2)
Basos: 1 %
EOS (ABSOLUTE): 0.1 10*3/uL (ref 0.0–0.4)
Eos: 1 %
Hematocrit: 31.5 % — ABNORMAL LOW (ref 37.5–51.0)
Hemoglobin: 9 g/dL — ABNORMAL LOW (ref 13.0–17.7)
Immature Grans (Abs): 0.1 10*3/uL (ref 0.0–0.1)
Immature Granulocytes: 1 %
Lymphocytes Absolute: 0.7 10*3/uL (ref 0.7–3.1)
Lymphs: 9 %
MCH: 23.5 pg — ABNORMAL LOW (ref 26.6–33.0)
MCHC: 28.6 g/dL — ABNORMAL LOW (ref 31.5–35.7)
MCV: 82 fL (ref 79–97)
Monocytes Absolute: 0.7 10*3/uL (ref 0.1–0.9)
Monocytes: 9 %
Neutrophils Absolute: 6 10*3/uL (ref 1.4–7.0)
Neutrophils: 79 %
Platelets: 382 10*3/uL (ref 150–450)
RBC: 3.83 x10E6/uL — ABNORMAL LOW (ref 4.14–5.80)
RDW: 21.1 % — ABNORMAL HIGH (ref 11.6–15.4)
WBC: 7.6 10*3/uL (ref 3.4–10.8)

## 2023-06-11 LAB — BRAIN NATRIURETIC PEPTIDE: BNP: 361 pg/mL — ABNORMAL HIGH (ref 0.0–100.0)

## 2023-06-11 LAB — TSH: TSH: 3.59 u[IU]/mL (ref 0.450–4.500)

## 2023-06-11 MED ORDER — ACETAMINOPHEN 325 MG PO TABS
650.0000 mg | ORAL_TABLET | Freq: Once | ORAL | Status: AC
  Administered 2023-06-11: 650 mg via ORAL

## 2023-06-11 MED ORDER — SODIUM CHLORIDE 0.9 % IV SOLN
510.0000 mg | Freq: Once | INTRAVENOUS | Status: AC
  Administered 2023-06-11: 510 mg via INTRAVENOUS
  Filled 2023-06-11: qty 17

## 2023-06-11 MED ORDER — DIPHENHYDRAMINE HCL 25 MG PO CAPS
25.0000 mg | ORAL_CAPSULE | Freq: Once | ORAL | Status: AC
  Administered 2023-06-11: 25 mg via ORAL

## 2023-06-11 NOTE — Progress Notes (Signed)
 Diagnosis: Iron Deficiency Anemia  Provider:  Samantha Cress MD  Procedure: IV Infusion  IV Type: Peripheral, IV Location: R Forearm  Feraheme (Ferumoxytol ), Dose: 510 mg  Infusion Start Time: 1322  Infusion Stop Time: 1338  Post Infusion IV Care: Observation period completed and Peripheral IV Discontinued  Discharge: Condition: Good, Destination: Home . AVS Declined  Performed by:  Arlina Benjamin, RN

## 2023-06-12 DIAGNOSIS — Z79899 Other long term (current) drug therapy: Secondary | ICD-10-CM | POA: Diagnosis not present

## 2023-06-12 DIAGNOSIS — Z299 Encounter for prophylactic measures, unspecified: Secondary | ICD-10-CM | POA: Diagnosis not present

## 2023-06-12 DIAGNOSIS — I10 Essential (primary) hypertension: Secondary | ICD-10-CM | POA: Diagnosis not present

## 2023-06-12 DIAGNOSIS — E1165 Type 2 diabetes mellitus with hyperglycemia: Secondary | ICD-10-CM | POA: Diagnosis not present

## 2023-06-12 NOTE — Progress Notes (Signed)
Attempted to contact patient regarding results. Left message on voicemail for patient to return call.

## 2023-06-15 NOTE — Progress Notes (Signed)
Attempted to contact patient regarding results. Left message on voicemail for patient to return call.

## 2023-06-16 ENCOUNTER — Ambulatory Visit: Admitting: Internal Medicine

## 2023-06-16 ENCOUNTER — Encounter: Payer: Self-pay | Admitting: Internal Medicine

## 2023-06-16 VITALS — BP 107/58 | HR 62 | Temp 97.9°F | Ht 69.0 in | Wt 184.2 lb

## 2023-06-16 DIAGNOSIS — D509 Iron deficiency anemia, unspecified: Secondary | ICD-10-CM

## 2023-06-16 DIAGNOSIS — Z86018 Personal history of other benign neoplasm: Secondary | ICD-10-CM | POA: Diagnosis not present

## 2023-06-16 DIAGNOSIS — I480 Paroxysmal atrial fibrillation: Secondary | ICD-10-CM

## 2023-06-16 NOTE — Progress Notes (Signed)
 Primary Care Physician:  Rosamond Leta NOVAK, MD Primary Gastroenterologist:  Dr. Shaaron  Pre-Procedure History & Physical: HPI:  Brett Matthews is a 89 y.o. male with CAD, dyspnea, COPD, depression has had well established iron deficiency anemia for some time.  Endoscopic evaluation delayed for a number of reasons over the past several months.) he came for EGD and colonoscopy back on 5/ 9/25.  Was found to be in congestive heart failure with a atrial fibrillation with a rapid ventricular rate.  He was admitted at procedure was canceled he is admitted to the hospital for transfusion Cardizem  drip in the ICU.  EGD carried out while inpatient fundic land polyp removed at the stomach no H. pylori small duodenal adenoma the second portion of the duodenum.  Colonoscopy prep was poor internal hemorrhoids no gross carcinoma, however.  Since admission was taken off anticoagulation.  He remains frail and chronically short of breath.  He is now 89 years old.  Has not had any melena or rectal bleeding has not had any nausea or vomiting or abdominal pain Past Medical History:  Diagnosis Date   Anemia, iron deficiency    Negative capsule endoscopy   Arthritis    Coronary atherosclerosis of native coronary artery    60 % circumflex stenosis; EF 65%, Cath 6/13   Diverticula of colon    Pancolonic   DM (diabetes mellitus), type 2, uncontrolled    Dyspnea    Normal cardiopulmonary function test in July 2008, negative echocardiogram with bubble study for inter-cardiac shunt.   Dysrhythmia    Essential hypertension, benign    Gastroesophageal reflux disease    Hemorrhoids    Hyperplastic colon polyp 04/06/2007   Hypothyroidism 2003   Status post total thyroidectomy   Memory difficulties    Migraines    Neoplasm of lymphatic and hematopoietic tissue    Paroxysmal atrial fibrillation (HCC)    Diagnosed 2005   Skin cancer     Past Surgical History:  Procedure Laterality Date   BACK SURGERY      BONE MARROW BIOPSY  1990's   CARDIAC CATHETERIZATION     CARDIOVERSION N/A 01/18/2019   Procedure: CARDIOVERSION;  Surgeon: Ladona Heinz, MD;  Location: [facility] ENDOSCOPY;  Service: Cardiovascular;  Laterality: N/A;   CARDIOVERSION N/A 03/20/2020   Procedure: CARDIOVERSION;  Surgeon: Ladona Heinz, MD;  Location: [facility] ENDOSCOPY;  Service: Cardiovascular;  Laterality: N/A;   CATARACT EXTRACTION Bilateral    COLONOSCOPY  04/06/2007   Dr. Shaaron- Normal rectum with scattered pancolonic diverticula and slightly redundant elongated colon, diminutive polpy midsigmoid, remainder of colonic mucosa appeared normal. bx= hyperplastic polyp   COLONOSCOPY N/A 11/29/2012   MFM:Jizvljuz preparation. Friable anal canal/internal hemorrhoids; otherwise, normal rectum. Normal-appearing colonic mucosa   COLONOSCOPY N/A 05/21/2023   Procedure: COLONOSCOPY;  Surgeon: Cinderella Deatrice FALCON, MD;  Location: [facility] ENDO SUITE;  Service: Endoscopy;  Laterality: N/A;   COLONOSCOPY WITH PROPOFOL  N/A 03/12/2017   Procedure: COLONOSCOPY WITH PROPOFOL ;  Surgeon: Shaaron Lamar HERO, MD;  Location: [facility] ENDO SUITE;  Service: Endoscopy;  Laterality: N/A;  7:30am   ESOPHAGOGASTRODUODENOSCOPY  04/06/2007   Dr. Shaaron- normal esophagus s/p nissen fundoplication, intact nissen wrap o/w normal stomach   ESOPHAGOGASTRODUODENOSCOPY N/A 11/29/2012   RMR: Abnormall distal esophagus bx c/w GERD. prior fundoplication. Gastric polyps  -bx benign. Status post biopsy of normal--appearing duodenal mucosa (bx neg)   ESOPHAGOGASTRODUODENOSCOPY N/A 05/21/2023   Procedure: EGD (ESOPHAGOGASTRODUODENOSCOPY);  Surgeon: Cinderella Deatrice FALCON, MD;  Location: [facility]O [facility];  Service: Endoscopy;  Laterality: N/A;   HEMORRHOID SURGERY N/A 04/06/2017   Procedure: EXTENSIVE HEMORRHOIDECTOMY;  Surgeon: Mavis Anes, MD;  Location: [facility] ORS;  Service: General;  Laterality: N/A;   LEFT HEART CATHETERIZATION WITH CORONARY ANGIOGRAM N/A 06/27/2011   Procedure: LEFT HEART CATHETERIZATION WITH CORONARY  ANGIOGRAM;  Surgeon: Aleene JINNY Passe, MD;  Location: [facility] CATH LAB;  Service: Cardiovascular;  Laterality: N/A;   NISSEN FUNDOPLICATION     ORIF ANKLE FRACTURE Right 03/19/2021   Procedure: OPEN REDUCTION INTERNAL FIXATION (ORIF) ANKLE FRACTURE, COLATERAL ANKLE LIGAMENT REPAIR;  Surgeon: Beverley Evalene BIRCH, MD;  Location: [facility] ORS;  Service: Orthopedics;  Laterality: Right;   POLYPECTOMY  03/12/2017   Procedure: POLYPECTOMY;  Surgeon: Shaaron Lamar HERO, MD;  Location: [facility] ENDO SUITE;  Service: Endoscopy;;  cecal x3   POSTERIOR LAMINECTOMY / DECOMPRESSION LUMBAR SPINE  ~ 1990's   SKIN CANCER EXCISION     off my back and chest; from sun    Prior to Admission medications   Medication Sig Start Date End Date Taking? Authorizing Provider  acetaminophen  (TYLENOL ) 500 MG tablet Take 2 tablets (1,000 mg total) by mouth every 6 (six) hours as needed for mild pain or moderate pain. 03/22/21  Yes Gawne, Meghan M, PA-C  albuterol  (VENTOLIN  HFA) 108 (90 Base) MCG/ACT inhaler Inhale 2 puffs into the lungs every 4 (four) hours as needed for wheezing or shortness of breath. 02/14/23  Yes [provider]  atorvastatin  (LIPITOR ) 10 MG tablet Take 1 tablet (10 mg total) by mouth daily. 02/02/20  Yes Ladona Heinz, MD  carvedilol  (COREG ) 25 MG tablet Take 1 tablet (25 mg total) by mouth 2 (two) times daily with a meal. 05/22/23 06/21/23 Yes Shah, Pratik D, DO  cholecalciferol  (VITAMIN D3) 25 MCG (1000 UNIT) tablet Take 1,000 Units by mouth daily.   Yes [provider]  citalopram  (CELEXA ) 40 MG tablet Take 40 mg by mouth daily.   Yes [provider]  diltiazem  (CARDIZEM  CD) 240 MG 24 hr capsule Take 240 mg by mouth daily.   Yes [provider]  docusate sodium  (COLACE) 100 MG capsule Take 1 capsule (100 mg total) by mouth 2 (two) times daily. 05/22/23 05/21/24 Yes Shah, Pratik D, DO  famotidine  (PEPCID ) 40 MG tablet Take 40 mg by mouth daily. 03/04/20  Yes [provider]  ferrous sulfate   325 (65 FE) MG EC tablet Take 1 tablet (325 mg total) by mouth 2 (two) times daily. 05/22/23 05/21/24 Yes Shah, Pratik D, DO  folic acid  (FOLVITE ) 1 MG tablet Take 1 mg by mouth daily.   Yes [provider]  furosemide  (LASIX ) 20 MG tablet Take 20 mg by mouth daily as needed for fluid or edema. 04/15/23  Yes [provider]  gabapentin  (NEURONTIN ) 300 MG capsule Take 1 capsule (300 mg total) by mouth 3 (three) times daily. 05/22/23 06/21/23 Yes Shah, Pratik D, DO  glimepiride  (AMARYL ) 2 MG tablet Take 2 mg by mouth 2 (two) times daily. 03/10/17  Yes [provider]  hydrALAZINE  (APRESOLINE ) 50 MG tablet TAKE 1 TABLET BY MOUTH THREE TIMES DAILY 02/20/23  Yes Ladona Heinz, MD  levocetirizine (XYZAL) 5 MG tablet Take 5 mg by mouth at bedtime. 02/07/22  Yes [provider]  levothyroxine  (SYNTHROID ) 125 MCG tablet Take 125 mcg by mouth daily at 2 am.   Yes [provider]  LINZESS  290 MCG CAPS capsule Take 290 mcg by mouth daily. 02/07/22  Yes [provider]  Melatonin 10  MG TABS Take 10 mg by mouth at bedtime.   Yes [provider]  metFORMIN  (GLUCOPHAGE ) 500 MG tablet Take 500-1,000 mg by mouth See admin instructions. Take 2 tabs (1,000mg ) by mouth every morning & 1 tab (500mg ) every evening   Yes [provider]  Multiple Vitamins-Minerals (ONE DAILY MULTIVITAMIN MEN) TABS Take 1 tablet by mouth daily.   Yes [provider]  Multiple Vitamins-Minerals (PRESERVISION AREDS PO) Take 1 tablet by mouth in the morning and at bedtime.   Yes [provider]  mupirocin ointment (BACTROBAN) 2 % 1 Application 2 (two) times daily.   Yes [provider]  Nutritional Supplements (FRUIT & VEGETABLE DAILY) CAPS Take 2 capsules by mouth daily.   Yes [provider]  olmesartan  (BENICAR ) 20 MG tablet Take 1 tablet (20 mg total) by mouth daily. 06/10/23  Yes Darlean Ozell NOVAK, MD  pantoprazole  (PROTONIX ) 40 MG tablet Take 40 mg by  mouth daily at 12 noon.   Yes [provider]  potassium chloride  (KLOR-CON  M) 10 MEQ tablet Take 10 mEq by mouth daily. 05/11/23  Yes [provider]  potassium chloride  (KLOR-CON ) 10 MEQ tablet Take 10 mEq by mouth daily.   Yes [provider]  predniSONE  (DELTASONE ) 10 MG tablet Take 10 mg by mouth 2 (two) times daily. 04/27/23  Yes [provider]  tamsulosin  (FLOMAX ) 0.4 MG CAPS capsule Take 0.4 mg by mouth daily after breakfast. 09/09/12  Yes [provider]    Allergies as of 06/16/2023   (No Known Allergies)    Family History  Problem Relation Age of Onset   Hypertension Mother    Stroke Mother    Cancer Father    Cancer Sister    Stroke Brother    Cancer Other    Stroke Other    Colon cancer Neg Hx     Social History   Socioeconomic History   Marital status: Divorced    Spouse name: Not on file   Number of children: 2   Years of education: Not on file   Highest education level: Not on file  Occupational History   Occupation: Full Time  Tobacco Use   Smoking status: Never   Smokeless tobacco: Never  Vaping Use   Vaping status: Never Used  Substance and Sexual Activity   Alcohol  use: No   Drug use: No   Sexual activity: Never  Other Topics Concern   Not on file  Social History Narrative   Not on file   Social Drivers of Health   Financial Resource Strain: Low Risk  (04/30/2023)   Received from [facility]   Overall Financial Resource Strain (CARDIA)    Difficulty of Paying Living Expenses: Not very hard  Food Insecurity: Patient Unable To Answer (05/18/2023)   Hunger Vital Sign    Worried About Running Out of Food in the Last Year: Patient unable to answer    Ran Out of Food in the Last Year: Patient unable to answer  Transportation Needs: Patient Unable To Answer (05/18/2023)   PRAPARE - Transportation    Lack of Transportation (Medical): Patient unable to answer    Lack of Transportation (Non-Medical):  Patient unable to answer  Physical Activity: Inactive (04/30/2023)   Received from [facility]   Exercise Vital Sign    Days of Exercise per Week: 0 days    Minutes of Exercise per Session: 0 min  Stress: No Stress Concern Present (04/30/2023)   Received  from [facility] - Occupational Stress Questionnaire    Feeling of Stress : Only a little  Social Connections: Unknown (05/18/2023)   Social Connection and Isolation Panel [NHANES]    Frequency of Communication with Friends and Family: Patient unable to answer    Frequency of Social Gatherings with Friends and Family: Patient unable to answer    Attends Religious Services: Patient unable to answer    Active Member of Clubs or Organizations: Patient unable to answer    Attends Banker Meetings: Patient declined    Marital Status: Patient declined  Recent Concern: Social Connections - Socially Isolated (04/30/2023)   Received from [facility]   Social Connection and Isolation Panel [NHANES]    Frequency of Communication with Friends and Family: Once a week    Frequency of Social Gatherings with Friends and Family: Once a week    Attends Religious Services: Never    Database administrator or Organizations: No    Attends Banker Meetings: Never    Marital Status: Divorced  Catering manager Violence: Patient Unable To Answer (05/18/2023)   Humiliation, Afraid, Rape, and Kick questionnaire    Fear of Current or Ex-Partner: Patient unable to answer    Emotionally Abused: Patient unable to answer    Physically Abused: Patient unable to answer    Sexually Abused: Patient unable to answer    Review of Systems: See HPI, otherwise negative ROS  Physical Exam: BP (!) 107/58 (BP Location: Right Arm, Patient Position: Sitting, Cuff Size: Normal)   Pulse 62   Temp 97.9 F (36.6 C) (Oral)   Ht 5' 9 (1.753 m)   Wt 184 lb 3.2 oz (83.6 kg)   SpO2 96%   BMI 27.20  kg/m  General:   Alert, chronically ill-appearing pleasant and cooperative in NAD; accompanied by daughter. Heart:  Regular rate and rhythm; no murmurs, clicks, rubs,  or gallops. Abdomen: Non-distended, normal bowel sounds.  Soft and nontender without appreciable mass or hepatosplenomegaly.   Impression/Plan: Frail 89 year old gentleman longstanding iron deficiency anemia no H. pylori on recent EGD duodenal adenoma biopsied but not removed; inadequate colonoscopy preparation for polyp detection but no gross lesions seen.  We discussed the relative inadequacy of colonoscopy and duodenal adenoma during recent visit.  Ideally, go back and wrap-up duodenal resection and then recheck his colon setting of a good prep.  Patient has made it clear to me that he at this point wishes no further invasive studies and does not want to have any further endoscopic evaluation.  This is not unreasonable.  I discussed with the patient and daughter that we can certainly follow him clinically.  Not going back in performing thorough evaluation might delay diagnosis of an occult lesion which could negatively impact his health.  He understands and wishes not to have any further endoscopic evaluation performed.  Recommendations:  As discussed, I respect pt decision not to have a colonoscopy or other invasive studies at this point in time.  Given other health problems, the risk may not be worth the benefit.  Of course, not pursuing further evaluation may delay diagnosis of some unknown problem as discussed.  However, we need to take into consideration your overall health and the risks associated with further invasive procedures.  For now, I recommend hemoglobin checked periodically and receive iron as needed  Continue taking Protonix  40 mg daily  Plan to see back in 6 months and  as needed      Notice: This dictation was prepared with Dragon dictation along with smaller phrase technology. Any transcriptional errors  that result from this process are unintentional and may not be corrected upon review.

## 2023-06-16 NOTE — Progress Notes (Signed)
Spoke with patient regarding results and recommendations. Patient voiced understanding. No further questions or concerns.

## 2023-06-16 NOTE — Patient Instructions (Signed)
 It was good to see you again today!  As discussed, I respect your decision not to have a colonoscopy or other invasive studies at this point in time.  Given your other health problems, the risk may not be worth the benefit.  Of course, not pursuing further evaluation may delay diagnosis of some unknown problem.  However, we need to take into consideration your overall health and the risks associated with further invasive procedures.  For now, I recommend you continue to have your hemoglobin checked periodically and receive iron as needed  Continue taking Protonix  40 mg daily  Plan to see you back in 6 months and as needed

## 2023-07-17 ENCOUNTER — Encounter: Payer: Self-pay | Admitting: Cardiology

## 2023-07-17 ENCOUNTER — Ambulatory Visit: Attending: Cardiology | Admitting: Cardiology

## 2023-07-17 VITALS — BP 125/69 | HR 68 | Resp 16 | Ht 69.0 in | Wt 185.1 lb

## 2023-07-17 DIAGNOSIS — I480 Paroxysmal atrial fibrillation: Secondary | ICD-10-CM | POA: Diagnosis not present

## 2023-07-17 DIAGNOSIS — I2510 Atherosclerotic heart disease of native coronary artery without angina pectoris: Secondary | ICD-10-CM | POA: Diagnosis not present

## 2023-07-17 DIAGNOSIS — Z5309 Procedure and treatment not carried out because of other contraindication: Secondary | ICD-10-CM

## 2023-07-17 DIAGNOSIS — I10 Essential (primary) hypertension: Secondary | ICD-10-CM | POA: Diagnosis not present

## 2023-07-17 MED ORDER — ATORVASTATIN CALCIUM 20 MG PO TABS: 20.0000 mg | ORAL_TABLET | Freq: Every day | ORAL | 3 refills | Status: AC

## 2023-07-17 NOTE — Patient Instructions (Addendum)
 Medication Instructions:  Your physician has recommended you make the following change in your medication: Increase Atorvastatin  to 20 mg by mouth daily   *If you need a refill on your cardiac medications before your next appointment, please call your pharmacy*  Lab Work: none If you have labs (blood work) drawn today and your tests are completely normal, you will receive your results only by: MyChart Message (if you have MyChart) OR A paper copy in the mail If you have any lab test that is abnormal or we need to change your treatment, we will call you to review the results.  Testing/Procedures: none  Follow-Up: At [facility], you and your health needs are our priority.  As part of our continuing mission to provide you with exceptional heart care, our providers are all part of one team.  This team includes your primary Cardiologist (physician) and Advanced Practice Providers or APPs (Physician Assistants and Nurse Practitioners) who all work together to provide you with the care you need, when you need it.  Your next appointment:   3 month(s)  Provider:   Gordy Bergamo, MD    We recommend signing up for the patient portal called MyChart.  Sign up information is provided on this After Visit Summary.  MyChart is used to connect with patients for Virtual Visits (Telemedicine).  Patients are able to view lab/test results, encounter notes, upcoming appointments, etc.  Non-urgent messages can be sent to your provider as well.   To learn more about what you can do with MyChart, go to [email or web address].   Other Instructions You have been referred to  electrophysiology

## 2023-07-17 NOTE — Progress Notes (Unsigned)
 Cardiology Office Note:  .   Date:  07/18/2023  ID:  Brett Matthews, DOB 01-07-1932, MRN [id number] PCP: Rosamond Leta NOVAK, MD  [facility] Providers Cardiologist:  Gordy Bergamo, MD   History of Present Illness: .   Brett Matthews is a 89 y.o. Caucasian male with paroxysmal atrial fibrillation, hypertension, hyperlipidemia, controlled diabetes mellitus, moderate coronary artery disease in mid circumflex with 60% stenosis by angiography in 2013.  He has had a Lexiscan  nuclear stress test on 12/27/2018 which revealed normal perfusion with ejection fraction of 58% and echocardiogram on 05/16/2023 revealed normal LVEF at 50 to 55% otherwise no significant abnormality.  He was admitted for acute blood loss anemia 04/30/2023 and EGD on 05/21/2023 revealing congestive gastropathy for which he underwent clip placement.  Colonoscopy revealed nonbleeding internal hemorrhoids.  Received IV iron therapy.  Also found to have diverticulosis of the colon.this is annual visit.   Discussed the use of AI scribe software for clinical note transcription with the patient, who gave verbal consent to proceed.  History of Present Illness Brett Matthews is an 89 year old male with atrial fibrillation and recent gastrointestinal bleeding who presents for cardiovascular evaluation. He is accompanied by his brother.  Atrial fibrillation was identified during a hospitalization one and a half months ago. Anticoagulation therapy was stopped due to a gastrointestinal bleed. He is currently not on blood thinners, with blood levels closely monitored.  Hypertension, coronary artery disease, and diabetes are present, increasing stroke risk. Blood pressure is managed with Hydralazine , Carvedilol , and Olmesartan . No recent angina is reported.  Cholesterol levels are elevated, managed with Atorvastatin  10 mg.  He lives with Brett Matthews, his daughter, who assists with medication management. There is a possibility of forgetting midday  medications.  Labs   Lab Results  Component Value Date   NA 139 06/10/2023   K 3.6 06/10/2023   CO2 23 06/10/2023   GLUCOSE 151 (H) 06/10/2023   BUN 11 06/10/2023   CREATININE 0.88 06/10/2023   CALCIUM  9.5 06/10/2023   EGFR 82 06/10/2023   GFRNONAA >60 05/22/2023      Latest Ref Rng & Units 06/10/2023    4:04 PM 05/22/2023    4:47 AM 05/21/2023    6:23 PM  BMP  Glucose 70 - 99 mg/dL 848  851    BUN 8 - 27 mg/dL 11  8    Creatinine 9.23 - 1.27 mg/dL 9.11  9.10    BUN/Creat Ratio 10 - 24 13     Sodium 134 - 144 mmol/L 139  139    Potassium 3.5 - 5.2 mmol/L 3.6  3.9  3.4   Chloride 96 - 106 mmol/L 100  106    CO2 20 - 29 mmol/L 23  25    Calcium  8.6 - 10.2 mg/dL 9.5  9.1        Latest Ref Rng & Units 06/10/2023    4:04 PM 06/04/2023    1:04 PM 05/22/2023    4:47 AM  CBC  WBC 3.4 - 10.8 x10E3/uL 7.6  9.0  7.8   Hemoglobin 13.0 - 17.7 g/dL 9.0  8.2  9.6   Hematocrit 37.5 - 51.0 % 31.5  28.1  31.4   Platelets 150 - 450 x10E3/uL 382  450  270    Lab Results  Component Value Date   HGBA1C 6.3 (H) 05/15/2023    Lab Results  Component Value Date   TSH 3.590 06/10/2023    [facility] labs 10/27/2022:  Total cholesterol 94, triglycerides 76, HDL 37, LDL 104. ROS  Review of Systems  Cardiovascular:  Negative for chest pain, dyspnea on exertion and leg swelling.   Physical Exam:   VS:  BP 125/69 (BP Location: Left Arm, Patient Position: Sitting, Cuff Size: Normal)   Pulse 68   Resp 16   Ht 5' 9 (1.753 m)   Wt 185 lb 1.6 oz (84 kg)   SpO2 94%   BMI 27.33 kg/m    Wt Readings from Last 3 Encounters:  07/17/23 185 lb 1.6 oz (84 kg)  06/16/23 184 lb 3.2 oz (83.6 kg)  06/10/23 186 lb 3.2 oz (84.5 kg)    Physical Exam Neck:     Vascular: No carotid bruit or JVD.  Cardiovascular:     Rate and Rhythm: Normal rate and regular rhythm.     Pulses: Intact distal pulses.     Heart sounds: Normal heart sounds. No murmur heard.    No gallop.  Pulmonary:     Effort: Pulmonary  effort is normal.     Breath sounds: Normal breath sounds.  Abdominal:     General: Bowel sounds are normal.     Palpations: Abdomen is soft.  Musculoskeletal:     Right lower leg: No edema.     Left lower leg: No edema.    Studies Reviewed: .    Echocardiogram 05/16/2023:   1. Left ventricular ejection fraction, by estimation, is 50 to 55%. The left ventricle has low normal function. The left ventricle has no regional wall motion abnormalities. There is mild concentric left ventricular hypertrophy. 2. Right ventricular systolic function is normal. The right ventricular size is normal. There is mildly elevated pulmonary artery systolic pressure. 3. The mitral valve is normal in structure. No evidence of mitral valve regurgitation. No evidence of mitral stenosis. Moderate mitral annular calcification. 4. Tricuspid valve regurgitation is mild to moderate. 5. The aortic valve is normal in structure. Aortic valve regurgitation is not visualized. No aortic stenosis is present. 6. There is mild dilatation of the aortic root, measuring 39 mm. 7. The inferior vena cava is normal in size with <50% respiratory variability, suggesting right atrial pressure of 8 mmHg. EKG:    EKG Interpretation Date/Time:  Friday July 17 2023 15:03:50 EDT Ventricular Rate:  65 PR Interval:  168 QRS Duration:  144 QT Interval:  464 QTC Calculation: 482 R Axis:   -78  Text Interpretation: EKG 07/17/2023: Normal sinus rhythm at rate of 65 bpm, left anterior fascicular block.  Right bundle branch block.  Bifascicular block.  No evidence of ischemia.  Compared to 05/19/2023, A-fib with RVR has now been replaced. Confirmed by Nathanuel Cabreja, Jagadeesh ([id number]) on 07/17/2023 3:46:41 PM    Medications ordered    Meds ordered this encounter  Medications   atorvastatin  (LIPITOR ) 20 MG tablet    Sig: Take 1 tablet (20 mg total) by mouth daily.    Dispense:  90 tablet    Refill:  3    Dose increase     ASSESSMENT AND PLAN: .       ICD-10-CM   1. Paroxysmal atrial fibrillation (HCC)  I48.0 EKG 12-Lead    Ambulatory referral to Cardiac Electrophysiology    2. Essential hypertension, benign  I10     3. Coronary artery disease involving native coronary artery of native heart without angina pectoris  I25.10     4. Contraindication to anticoagulation therapy  Z53.09 Ambulatory referral to Cardiac Electrophysiology  [facility] Referral for Left Atrial Appendage Closure with Non-Valvular Atrial Fibrillation   JONN CHAIKIN is a 89 y.o. male is being referred to the [facility] for evaluation for Left Atrial Appendage Closure with Watchman device for the management of stroke risk resulting form non-valvular atrial fibrillation.    Base upon Brett Matthews history, he is felt to be a poor candidate for long-term anticoagulation because of a history of bleeding (e.g. intracerebral, subdural, GI, retro-peritoneal).  The patient has a HAS-BLED score of 4 indicating a Yearly Major Bleeding Risk of 8.7%.     His CHADS2-VASc Score is 5 with an unadjusted Ischemic Stroke Rate (% per year) of 7.2%.   His stroke risk necessitates a strategy of stroke prevention with either long-term oral anticoagulation or left atrial appendage occlusion therapy. We have discussed their bleeding risk in the context of their comorbid medical problems, as well as the rationale for referral for evaluation of Watchman left atrial appendage occlusion therapy. While the patient is at high long-term bleeding risk, they may be appropriate for short-term anticoagulation. Based on this individual patient's stroke and bleeding risk, a shared decision has been made to refer the patient for consideration of Watchman left atrial appendage closure utilizing the [employer] of Cardiology shared decision tool.  Assessment & Plan Paroxysmal Atrial Fibrillation Significant stroke risk due to age, hypertension, coronary  artery disease, and diabetes. Recent hospitalization required cessation of anticoagulation due to gastrointestinal bleed. Stroke risk remains high without anticoagulation. Consideration of Watchman device to reduce stroke risk without blood thinners. - Refer to an electrophysiologist to discuss the Watchman device procedure to reduce stroke risk. - He has had PAF and recent hospitalization EKG on 05/19/2023 confirms A-fib with RVR again - Patient is 89 years of age and still continues to be relatively independent and active.  Gastrointestinal Bleed Recent gastrointestinal bleed led to cessation of anticoagulation, complicating atrial fibrillation management. No history of prior GI bleeding.  Coronary Artery Disease Coronary artery disease without angina. Cholesterol levels are higher than desired, necessitating an adjustment in medication. - Increase atorvastatin  dose from 10 mg to 20 mg daily to better manage cholesterol levels.  Hyperlipidemia Cholesterol levels higher than target. - Increase atorvastatin  dose from 10 mg to 20 mg daily.  Hypertension Hypertension is well-controlled with current medications. - Continue current antihypertensive regimen: hydralazine  three times a day, carvedilol  25 mg twice a day, and olmesartan  20 mg once a day.  Diabetes Mellitus Contributes to the overall cardiovascular risk profile.    Signed,  Gordy Bergamo, MD, [facility] 07/18/2023, 8:03 AM [facility] [address] [place], [place] [address] Phone: [phone]. Fax:  [phone]

## 2023-07-22 DIAGNOSIS — I5032 Chronic diastolic (congestive) heart failure: Secondary | ICD-10-CM | POA: Diagnosis not present

## 2023-07-22 DIAGNOSIS — E1140 Type 2 diabetes mellitus with diabetic neuropathy, unspecified: Secondary | ICD-10-CM | POA: Diagnosis not present

## 2023-07-22 DIAGNOSIS — D649 Anemia, unspecified: Secondary | ICD-10-CM | POA: Diagnosis not present

## 2023-07-22 DIAGNOSIS — I10 Essential (primary) hypertension: Secondary | ICD-10-CM | POA: Diagnosis not present

## 2023-07-22 DIAGNOSIS — Z299 Encounter for prophylactic measures, unspecified: Secondary | ICD-10-CM | POA: Diagnosis not present

## 2023-07-22 DIAGNOSIS — F1320 Sedative, hypnotic or anxiolytic dependence, uncomplicated: Secondary | ICD-10-CM | POA: Diagnosis not present

## 2023-07-22 DIAGNOSIS — I4891 Unspecified atrial fibrillation: Secondary | ICD-10-CM | POA: Diagnosis not present

## 2023-07-24 ENCOUNTER — Ambulatory Visit: Payer: Self-pay | Admitting: Cardiology

## 2023-07-24 NOTE — Progress Notes (Signed)
 PCP faxed EKGs scratch that PCP faxed labs 07/22/2023:  Hb 11.4/HCT 37.5, platelets 255.

## 2023-07-27 NOTE — Progress Notes (Unsigned)
 Brett Matthews Finder, male    DOB: 11-05-1932    MRN: [id number]   Brief patient profile:  47   yowm  never smoker  referred to pulmonary clinic in [place]  06/10/2023 by [facility]  for doe/ orthopnea ever since covid but never admitted  and echo min [facility] 05/26/23    History of Present Illness  06/10/2023  Pulmonary/ 1st office eval/ [facility] / [place] Office  Chief Complaint  Patient presents with   Establish Care  Dyspnea: uses  2 wheeled [place], church and doctor's office  Cough: sporadic but not present at awakening nor noct Sleep: flat bed/ bunch of pillows or recliner sx 45 degrees SABA use: seem to help 02: none  Rec Heart ultrasound is also called an echo and was just done in hospital [facility]) and looks ok  Stop lotensin  and replace with olmesartan  20 mg one  each am  Please schedule a follow up office visit in 6 weeks, call sooner if needed     07/29/2023  f/u ov/[place] office/[facility] re: ? Acei case  maint on olmesartan  20 mg   Chief Complaint  Patient presents with   Follow-up    DOE  Dyspnea:  walking with a 3 pronged cane Not limited by breathing from desired activities   Cough: better vs last ov Sleeping: nasal congestions worsen at hs ? Over reliant on otc nasal decongestants but not sure which ones he's been using / still sleeps sitting up in recliner at 45 degrees SABA use: none  02: none  No obvious day to day or daytime variability or assoc excess/ purulent sputum or mucus plugs or hemoptysis or cp or chest tightness, subjective wheeze or overt   hb symptoms.    Also denies any obvious fluctuation of symptoms with weather or environmental changes or other aggravating or alleviating factors except as outlined above   No unusual exposure hx or h/o childhood pna/ asthma or knowledge of premature birth.  Current Allergies, Complete Past Medical History, Past Surgical History, Family History, and Social History were reviewed in [employer]  record.  ROS  The following are not active complaints unless bolded Hoarseness, sore throat, dysphagia, dental problems, itching, sneezing,  nasal congestion or discharge of excess mucus or purulent secretions, ear ache,   fever, chills, sweats, unintended wt loss or wt gain, classically pleuritic or exertional cp,  orthopnea pnd or arm/hand swelling  or leg swelling, presyncope, palpitations, abdominal pain, anorexia, nausea, vomiting, diarrhea  or change in bowel habits or change in bladder habits, change in stools or change in urine, dysuria, hematuria,  rash, arthralgias, visual complaints, headache, numbness, weakness or ataxia or problems with walking or coordination,  change in mood or  memory.        Current Meds  Medication Sig   acetaminophen  (TYLENOL ) 500 MG tablet Take 2 tablets (1,000 mg total) by mouth every 6 (six) hours as needed for mild pain or moderate pain.   albuterol  (VENTOLIN  HFA) 108 (90 Base) MCG/ACT inhaler Inhale 2 puffs into the lungs every 4 (four) hours as needed for wheezing or shortness of breath.   atorvastatin  (LIPITOR ) 20 MG tablet Take 1 tablet (20 mg total) by mouth daily.   carvedilol  (COREG ) 25 MG tablet Take 1 tablet (25 mg total) by mouth 2 (two) times daily with a meal.   cholecalciferol  (VITAMIN D3) 25 MCG (1000 UNIT) tablet Take 1,000 Units by mouth daily.   citalopram  (CELEXA ) 40 MG tablet Take 40  mg by mouth daily.   diltiazem  (CARDIZEM  CD) 240 MG 24 hr capsule Take 240 mg by mouth daily.   docusate sodium  (COLACE) 100 MG capsule Take 1 capsule (100 mg total) by mouth 2 (two) times daily.   famotidine  (PEPCID ) 40 MG tablet Take 40 mg by mouth daily.   ferrous sulfate  325 (65 FE) MG EC tablet Take 1 tablet (325 mg total) by mouth 2 (two) times daily.   folic acid  (FOLVITE ) 1 MG tablet Take 1 mg by mouth daily.   furosemide  (LASIX ) 20 MG tablet Take 20 mg by mouth daily as needed for fluid or edema.   gabapentin  (NEURONTIN ) 300 MG capsule Take 1 capsule  (300 mg total) by mouth 3 (three) times daily.   glimepiride  (AMARYL ) 2 MG tablet Take 2 mg by mouth 2 (two) times daily.   hydrALAZINE  (APRESOLINE ) 50 MG tablet TAKE 1 TABLET BY MOUTH THREE TIMES DAILY (Patient taking differently: Take 25 mg by mouth 3 (three) times daily.)   levocetirizine (XYZAL) 5 MG tablet Take 5 mg by mouth at bedtime.   levothyroxine  (SYNTHROID ) 125 MCG tablet Take 125 mcg by mouth daily at 2 am.   LINZESS  290 MCG CAPS capsule Take 290 mcg by mouth daily.   Melatonin 10 MG TABS Take 10 mg by mouth at bedtime.   metFORMIN  (GLUCOPHAGE ) 500 MG tablet Take 500-1,000 mg by mouth See admin instructions. Take 2 tabs (1,000mg ) by mouth every morning & 1 tab (500mg ) every evening   Multiple Vitamins-Minerals (ONE DAILY MULTIVITAMIN MEN) TABS Take 1 tablet by mouth daily.   Multiple Vitamins-Minerals (PRESERVISION AREDS PO) Take 1 tablet by mouth in the morning and at bedtime.   mupirocin ointment (BACTROBAN) 2 % 1 Application 2 (two) times daily.   Nutritional Supplements (FRUIT & VEGETABLE DAILY) CAPS Take 2 capsules by mouth daily.   olmesartan  (BENICAR ) 20 MG tablet Take 1 tablet (20 mg total) by mouth daily.   pantoprazole  (PROTONIX ) 40 MG tablet Take 40 mg by mouth daily at 12 noon.   potassium chloride  (KLOR-CON  M) 10 MEQ tablet Take 10 mEq by mouth daily.   potassium chloride  (KLOR-CON ) 10 MEQ tablet Take 10 mEq by mouth daily.   predniSONE  (DELTASONE ) 10 MG tablet Take 10 mg by mouth 2 (two) times daily.   tamsulosin  (FLOMAX ) 0.4 MG CAPS capsule Take 0.4 mg by mouth daily after breakfast.             Past Medical History:  Diagnosis Date   Anemia, iron deficiency    Negative capsule endoscopy   Arthritis    Coronary atherosclerosis of native coronary artery    60 % circumflex stenosis; EF 65%, Cath 6/13   Diverticula of colon    Pancolonic   DM (diabetes mellitus), type 2, uncontrolled    Dyspnea    Normal cardiopulmonary function test in July 2008, negative  echocardiogram with bubble study for inter-cardiac shunt.   Dysrhythmia    Essential hypertension, benign    Gastroesophageal reflux disease    Hemorrhoids    Hyperplastic colon polyp 04/06/2007   Hypothyroidism 2003   Status post total thyroidectomy   Memory difficulties    Migraines    Neoplasm of lymphatic and hematopoietic tissue    Paroxysmal atrial fibrillation (HCC)    Diagnosed 2005   Skin cancer       Objective:    Wt Readings from Last 3 Encounters:  07/29/23 187 lb 3.2 oz (84.9 kg)  07/17/23 185 lb 1.6  oz (84 kg)  06/16/23 184 lb 3.2 oz (83.6 kg)      Vital signs reviewed  07/29/2023  - Note at rest 02 sats  97% on RA   General appearance:    amb talkative wm all smiles   HEENT : Oropharynx  clear      Nasal turbinates mild non-specific edema/ no polps or obst viz   NECK :  without  apparent JVD/ palpable Nodes/TM    LUNGS: no acc muscle use,  Nl contour chest which is clear to A and P bilaterally without cough on insp or exp maneuvers   CV:  RRR  no s3 or 2-3/6 SEM  increase in P2, and no trace pitting edema both LEs  ABD:  soft and nontender   MS:  Gait nl   ext warm without deformities Or obvious joint restrictions  calf tenderness, cyanosis or clubbing    SKIN: warm and dry without lesions    NEURO:  alert, approp, nl sensorium with  no motor or cerebellar deficits apparent.          Assessment

## 2023-07-28 DIAGNOSIS — H6121 Impacted cerumen, right ear: Secondary | ICD-10-CM | POA: Diagnosis not present

## 2023-07-28 DIAGNOSIS — Z299 Encounter for prophylactic measures, unspecified: Secondary | ICD-10-CM | POA: Diagnosis not present

## 2023-07-28 DIAGNOSIS — I10 Essential (primary) hypertension: Secondary | ICD-10-CM | POA: Diagnosis not present

## 2023-07-29 ENCOUNTER — Encounter: Payer: Self-pay | Admitting: Internal Medicine

## 2023-07-29 ENCOUNTER — Ambulatory Visit: Admitting: Internal Medicine

## 2023-07-29 VITALS — BP 131/70 | HR 79 | Ht 69.0 in | Wt 187.2 lb

## 2023-07-29 DIAGNOSIS — R0609 Other forms of dyspnea: Secondary | ICD-10-CM

## 2023-07-29 DIAGNOSIS — I10 Essential (primary) hypertension: Secondary | ICD-10-CM | POA: Diagnosis not present

## 2023-07-29 NOTE — Patient Instructions (Addendum)
 No change in medications   Try breathe - Rite strips and if not better then ENT evaluation may be needed    Pulmonary follow up is as needed

## 2023-07-29 NOTE — Assessment & Plan Note (Signed)
 D/c ACEi 06/10/2023 for unexplained sob   Although even in retrospect it may not be clear the ACEi contributed to the pt's symptoms,  Pt improved off them and adding them back at this point or in the future would risk confusion in interpretation of non-specific respiratory symptoms to which this patient is prone  ie  Better not to muddy the waters here.   Bp ok on olemesartan 20 mg daily so no change needed/ f/u PCP         Each maintenance medication was reviewed in detail including emphasizing most importantly the difference between maintenance and prns and under what circumstances the prns are to be triggered using an action plan format where appropriate.  Total time for H and P, chart review, counseling,  and generating customized AVS unique to this office visit / same day charting = 23 min final summary f/ ov

## 2023-07-29 NOTE — Assessment & Plan Note (Signed)
 D/c acei 06/10/2023  - improved 07/29/2023 to his satisfaction x for noct nasal obst  - may benefit form ENT eval but nothing else to offer from pulmonary perspective > see prn

## 2023-07-31 ENCOUNTER — Other Ambulatory Visit: Payer: Self-pay

## 2023-07-31 ENCOUNTER — Ambulatory Visit: Attending: Cardiology | Admitting: Cardiology

## 2023-07-31 ENCOUNTER — Encounter: Payer: Self-pay | Admitting: Cardiology

## 2023-07-31 VITALS — BP 114/68 | HR 60 | Ht 69.0 in | Wt 191.0 lb

## 2023-07-31 DIAGNOSIS — I480 Paroxysmal atrial fibrillation: Secondary | ICD-10-CM | POA: Diagnosis not present

## 2023-07-31 DIAGNOSIS — K922 Gastrointestinal hemorrhage, unspecified: Secondary | ICD-10-CM | POA: Diagnosis not present

## 2023-07-31 DIAGNOSIS — D6869 Other thrombophilia: Secondary | ICD-10-CM | POA: Diagnosis not present

## 2023-07-31 NOTE — Patient Instructions (Signed)
 Medication Instructions:  Your physician recommends that you continue on your current medications as directed. Please refer to the Current Medication list given to you today.  *If you need a refill on your cardiac medications before your next appointment, please call your pharmacy*  Lab Work: TODAY:  BMET  Testing/Procedures: Cardiac CT Your physician has requested that you have cardiac CT. Cardiac computed tomography (CT) is a painless test that uses an x-ray machine to take clear, detailed pictures of your heart. For further information please visit [email or web address]  Please follow instruction sheet as given. You will be called to schedule this test.   Watchman  Your physician has requested that you have Left atrial appendage (LAA) closure device implantation is a procedure to put a small device in the LAA of the heart. The LAA is a small sac in the wall of the heart's left upper chamber. Blood clots can form in this area. The device, Watchman closes the LAA to help prevent a blood clot and stroke.  You will be contacted by Nurse Navigator, Larkin Plumb to schedule your pre-procedure visit and procedure date. If you have any questions she can be reached at [phone].   Follow-Up: At [facility], you and your health needs are our priority.  As part of our continuing mission to provide you with exceptional heart care, our providers are all part of one team.  This team includes your primary Cardiologist (physician) and Advanced Practice Providers or APPs (Physician Assistants and Nurse Practitioners) who all work together to provide you with the care you need, when you need it.

## 2023-07-31 NOTE — Progress Notes (Signed)
 Electrophysiology Office Note:   Date:  08/01/2023  ID:  Brett Matthews, DOB [birth year], MRN [id number]  Primary Cardiologist: Brett Bergamo, MD Electrophysiologist: Brett Kitty, MD      History of Present Illness:   Brett Matthews is a 89 y.o. male with h/o paroxysmal atrial fibrillation, hypertension, hyperlipidemia, controlled diabetes mellitus, moderate coronary artery disease, GI bleeding and acute blood loss anemia who is being seen today for evaluation for Watchman device implant at the request of Dr. Bergamo.  Discussed the use of AI scribe software for clinical note transcription with the patient, who gave verbal consent to proceed.  History of Present Illness Brett Matthews is an 89 year old male with atrial fibrillation who presents for evaluation of bleeding complications related to blood thinner use. He was referred by Dr. Bergamo for evaluation of Watchman device implant. He has previously experienced bleeding in his stool, which was investigated with a colonoscopy that did not identify the source of bleeding. This led to significant blood loss, necessitating the temporary cessation of his blood thinner medication.  He is very fearful of stroke while off anticoagulation.  He is otherwise doing relatively well.  With no new or acute complaints today.   Review of systems complete and found to be negative unless listed in HPI.   EP Information / Studies Reviewed:    EKG is not ordered today. EKG from 07/17/23 reviewed which showed SR with RBBB     EKG 05/15/23:    Echo 05/16/23:  1. Left ventricular ejection fraction, by estimation, is 50 to 55%. The  left ventricle has low normal function. The left ventricle has no regional  wall motion abnormalities. There is mild concentric left ventricular  hypertrophy.   2. Right ventricular systolic function is normal. The right ventricular  size is normal. There is mildly elevated pulmonary artery systolic  pressure.   3. The mitral valve  is normal in structure. No evidence of mitral valve  regurgitation. No evidence of mitral stenosis. Moderate mitral annular  calcification.   4. Tricuspid valve regurgitation is mild to moderate.   5. The aortic valve is normal in structure. Aortic valve regurgitation is  not visualized. No aortic stenosis is present.   6. There is mild dilatation of the aortic root, measuring 39 mm.   7. The inferior vena cava is normal in size with <50% respiratory  variability, suggesting right atrial pressure of 8 mmHg.   Risk Assessment/Calculations:    CHA2DS2-VASc Score = 5   This indicates a 7.2% annual risk of stroke. The patient's score is based upon: CHF History: 0 HTN History: 1 Diabetes History: 1 Stroke History: 0 Vascular Disease History: 1 Age Score: 2 Gender Score: 0             Physical Exam:   VS:  BP 114/68   Pulse 60   Ht 5' 9 (1.753 m)   Wt 191 lb (86.6 kg)   SpO2 96%   BMI 28.21 kg/m    Wt Readings from Last 3 Encounters:  07/31/23 191 lb (86.6 kg)  07/29/23 187 lb 3.2 oz (84.9 kg)  07/17/23 185 lb 1.6 oz (84 kg)     GEN: Well nourished, well developed in no acute distress NECK: No JVD CARDIAC: Normal rate, regular rhythm RESPIRATORY:  Clear to auscultation without rales, wheezing or rhonchi  ABDOMEN: Soft, non-distended EXTREMITIES:  No edema; No deformity   ASSESSMENT AND PLAN:    I have seen Brett [name] in the office today who is being considered for a Watchman left atrial appendage closure device. I believe they will benefit from this procedure given their history of atrial fibrillation, CHA2DS2-VASc score of 5 and unadjusted ischemic stroke rate of 7.2% per year. Unfortunately, the patient is not felt to be a long term anticoagulation candidate secondary to bleeding from a non-reversible cause. The patient's chart has been reviewed and I feel that they would be a candidate for short term oral anticoagulation after Watchman implant.   It is my belief  that after undergoing a LAA closure procedure, Brett Matthews will not need long term anticoagulation which eliminates anticoagulation side effects and major bleeding risk.   Procedural risks for the Watchman implant have been reviewed with the patient including a 0.5% risk of stroke, <1% risk of perforation and <1% risk of device embolization. Other risks include bleeding, vascular damage, tamponade, worsening renal function, and death. The patient understands these risk and wishes to proceed.     The published clinical data on the safety and effectiveness of WATCHMAN include but are not limited to the following: - Holmes DR, Jess BEARD, Sick P et al. for the PROTECT AF Investigators. Percutaneous closure of the left atrial appendage versus warfarin therapy for prevention of stroke in patients with atrial fibrillation: a randomised non-inferiority trial. Lancet 2009; 374: 534-42. GLENWOOD Jess BEARD, Doshi SK, Jonita VEAR Satchel D et al. on behalf of the PROTECT AF Investigators. Percutaneous Left Atrial Appendage Closure for Stroke Prophylaxis in Patients With Atrial Fibrillation 2.3-Year Follow-up of the PROTECT AF (Watchman Left Atrial Appendage System for Embolic Protection in Patients With Atrial Fibrillation) Trial. Circulation 2013; 127:720-729. - Alli O, Doshi S,  Kar S, Reddy VY, Sievert H et al. Quality of Life Assessment in the Randomized PROTECT AF (Percutaneous Closure of the Left Atrial Appendage Versus Warfarin Therapy for Prevention of Stroke in Patients With Atrial Fibrillation) Trial of Patients at Risk for Stroke With Nonvalvular Atrial Fibrillation. J Am Coll Cardiol 2013; 61:1790-8. GLENWOOD Satchel DR, Archer RAMAN, Price M, Whisenant B, Sievert H, Doshi S, Huber K, Reddy V. Prospective randomized evaluation of the Watchman left atrial appendage Device in patients with atrial fibrillation versus long-term warfarin therapy; the PREVAIL trial. Journal of the [employer] of Cardiology, Vol. 4, No. 1, 2014,  1-11. - Kar S, Doshi SK, Sadhu A, Horton R, Osorio J et al. Primary outcome evaluation of a next-generation left atrial appendage closure device: results from the PINNACLE FLX trial. Circulation 2021;143(18)1754-1762.    After today's visit with the patient which was dedicated solely for shared decision making visit regarding LAA closure device, the patient decided to proceed with the LAA appendage closure procedure scheduled to be done in the near future at [facility]. Prior to the procedure, I would like to obtain a gated CT scan of the chest with contrast timed for PV/LA visualization.   HAS-BLED score 3 Hypertension Yes  Abnormal renal and liver function (Dialysis, transplant, Cr >2.26 mg/dL /Cirrhosis or Bilirubin >2x Normal or AST/ALT/AP >3x Normal) No  Stroke No  Bleeding Yes  Labile INR (Unstable/high INR) No  Elderly (>65) Yes  Drugs or alcohol  (>= 8 drinks/week, anti-plt or NSAID) No   CHA2DS2-VASc Score = 5  The patient's score is based upon: CHF History: 0 HTN History: 1 Diabetes History: 1 Stroke History: 0 Vascular Disease History: 1 Age Score: 2 Gender Score: 0       ASSESSMENT AND PLAN: Paroxysmal Atrial  Fibrillation The patient's CHA2DS2-VASc score is 5, indicating a 7.2% annual risk of stroke.   Continue carvedilol  25 mg twice daily.  Secondary Hypercoagulable State GI bleeding: No obvious source identified. The patient is at significant risk for stroke/thromboembolism based upon his CHA2DS2-VASc Score of 5.  Patient and caregiver are very hesitant to resume oral anticoagulation.  We discussed importance of anticoagulation pre and post watchman implant to avoid LAA and device related thrombus. Start Apixaban (Eliquis) 2.5mg  BID 1 month prior to Watchman.    Follow up with Dr. Kennyth 3 months after [facility] implant.   Signed, Brett Kennyth, MD

## 2023-08-01 LAB — BASIC METABOLIC PANEL WITH GFR
BUN/Creatinine Ratio: 14 (ref 10–24)
BUN: 15 mg/dL (ref 8–27)
CO2: 25 mmol/L (ref 20–29)
Calcium: 10 mg/dL (ref 8.6–10.2)
Chloride: 100 mmol/L (ref 96–106)
Creatinine, Ser: 1.08 mg/dL (ref 0.76–1.27)
Glucose: 81 mg/dL (ref 70–99)
Potassium: 4.2 mmol/L (ref 3.5–5.2)
Sodium: 140 mmol/L (ref 134–144)
eGFR: 66 mL/min/1.73 (ref >59)

## 2023-08-03 ENCOUNTER — Ambulatory Visit: Payer: Self-pay | Admitting: Cardiology

## 2023-08-06 ENCOUNTER — Telehealth: Payer: Self-pay

## 2023-08-06 NOTE — Telephone Encounter (Signed)
 Copied from CRM [id number]. Topic: Clinical - Prescription Issue >> Aug 05, 2023  2:05 PM Celestine FALCON wrote: Reason for CRM: Pharmacist from [employer]. [place], [place] - [address] is calling to verify that Dr. Darlean is aware that the pt is already taking Benazepril  prescribed by a different provider while the prescription olmesartan  is being requested to be filled. She wanted to clarify for awareness, and if the patient is still taking benazepril .   Pharmacy info: [facility] - [place], [place] - [address] [place] [address] Phone: [phone] Fax: [phone] Hours: Not open 24 hours  Dr Darlean please advise

## 2023-08-07 NOTE — Telephone Encounter (Signed)
 Spoke with pharmacist, Summer at [facility] and informed her about the medications he is supposed to have stopped and started.  Confirmed understanding NFN

## 2023-08-12 ENCOUNTER — Ambulatory Visit (HOSPITAL_COMMUNITY)
Admission: RE | Admit: 2023-08-12 | Discharge: 2023-08-12 | Disposition: A | Discharge status: Home / Self Care | Source: Ambulatory Visit | Attending: Cardiovascular Disease | Admitting: Cardiovascular Disease

## 2023-08-12 DIAGNOSIS — I480 Paroxysmal atrial fibrillation: Secondary | ICD-10-CM | POA: Insufficient documentation

## 2023-08-12 DIAGNOSIS — I517 Cardiomegaly: Secondary | ICD-10-CM | POA: Diagnosis not present

## 2023-08-12 MED ORDER — IOHEXOL 350 MG/ML SOLN
80.0000 mL | Freq: Once | INTRAVENOUS | Status: AC | PRN
  Administered 2023-08-12: 80 mL via INTRAVENOUS

## 2023-08-19 ENCOUNTER — Telehealth: Payer: Self-pay | Admitting: Cardiology

## 2023-08-19 NOTE — Telephone Encounter (Signed)
 Caller Bobbetta) is following up on the results of patient's CT scan test.

## 2023-08-19 NOTE — Telephone Encounter (Signed)
 Spoke with Brett Matthews per pt DPR on file. Explained that the pt's CT has not been resulted by one of our providers yet but our office will be in touch as soon as we get a result note. Brett Matthews verbalized understanding of plan and had no further questions at this time.

## 2023-08-24 ENCOUNTER — Telehealth: Payer: Self-pay

## 2023-08-24 NOTE — Telephone Encounter (Signed)
 Aleene Finder: Chicken wing anatomy. (Poor quality images. Will confirm measurements on procedure day imaging) Max 22/ AVG 21/ Depth 14 Likely use a 27mm device  Inf/Mid TSP RAO 5 CAU 25

## 2023-08-27 DIAGNOSIS — B351 Tinea unguium: Secondary | ICD-10-CM | POA: Diagnosis not present

## 2023-08-27 DIAGNOSIS — E1142 Type 2 diabetes mellitus with diabetic polyneuropathy: Secondary | ICD-10-CM | POA: Diagnosis not present

## 2023-08-27 DIAGNOSIS — M79674 Pain in right toe(s): Secondary | ICD-10-CM | POA: Diagnosis not present

## 2023-08-27 DIAGNOSIS — M79675 Pain in left toe(s): Secondary | ICD-10-CM | POA: Diagnosis not present

## 2023-08-27 DIAGNOSIS — L84 Corns and callosities: Secondary | ICD-10-CM | POA: Diagnosis not present

## 2023-09-02 ENCOUNTER — Telehealth: Payer: Self-pay

## 2023-09-02 NOTE — Telephone Encounter (Signed)
 Caller Bobbetta) is following-up on patient's CT test results and if patient is a candidate for a Watchman.

## 2023-09-02 NOTE — Telephone Encounter (Signed)
 Spoke with the patient and his live-in caregiver, Brett Matthews [phone]) at length.  Per Dr. Kennyth, informed the patient that the Watchman procedure may be difficult but he is willing to try. Brett Matthews and Brett Matthews will chat tonight and call tomorrow for an update and decision on whether or not to schedule procedure. He was grateful for call and agreed with plan.

## 2023-09-09 NOTE — Telephone Encounter (Signed)
 Spoke with the patient at length, who thinks he wants to proceed with LAAO. However, Heron is not at the house now and he would like her to be in on planning. Will call her tomorrow at 1100 to confirm plans.

## 2023-09-10 DIAGNOSIS — E119 Type 2 diabetes mellitus without complications: Secondary | ICD-10-CM | POA: Diagnosis not present

## 2023-09-10 DIAGNOSIS — Z299 Encounter for prophylactic measures, unspecified: Secondary | ICD-10-CM | POA: Diagnosis not present

## 2023-09-10 DIAGNOSIS — F332 Major depressive disorder, recurrent severe without psychotic features: Secondary | ICD-10-CM | POA: Diagnosis not present

## 2023-09-10 DIAGNOSIS — I480 Paroxysmal atrial fibrillation: Secondary | ICD-10-CM | POA: Diagnosis not present

## 2023-09-10 DIAGNOSIS — I5032 Chronic diastolic (congestive) heart failure: Secondary | ICD-10-CM | POA: Diagnosis not present

## 2023-09-10 DIAGNOSIS — I10 Essential (primary) hypertension: Secondary | ICD-10-CM | POA: Diagnosis not present

## 2023-09-10 NOTE — Telephone Encounter (Signed)
Left message for Brett Matthews to call back.

## 2023-09-10 NOTE — Telephone Encounter (Signed)
 Spoke with Heron. She is unsure if the patient wants to proceed with LAAO. Reiterated to her he will need to start Eliquis  2.5 mg BID 1 month prior to procedure. She will talk with him tonight and call tomorrow with decision.

## 2023-09-11 ENCOUNTER — Other Ambulatory Visit: Payer: Self-pay

## 2023-09-11 DIAGNOSIS — K922 Gastrointestinal hemorrhage, unspecified: Secondary | ICD-10-CM

## 2023-09-11 DIAGNOSIS — D500 Iron deficiency anemia secondary to blood loss (chronic): Secondary | ICD-10-CM

## 2023-09-11 DIAGNOSIS — I480 Paroxysmal atrial fibrillation: Secondary | ICD-10-CM

## 2023-09-11 MED ORDER — APIXABAN 2.5 MG PO TABS: 2.5000 mg | ORAL_TABLET | Freq: Two times a day (BID) | ORAL | 0 refills | Status: DC

## 2023-09-11 NOTE — Addendum Note (Signed)
 Addended by: Cheveyo Virginia A on: 09/11/2023 04:02 PM   Modules accepted: Orders

## 2023-09-11 NOTE — Telephone Encounter (Signed)
 Spoke with the patient and Heron at length again. They have decided to proceed with LAAO on 10/22/2023. They understand he will need to START ELIQUIS  2.5 mg BID on 9/16. Scheduled him for pre-procedure visit on 10/3. They were grateful for call and agreed with plan.

## 2023-09-21 ENCOUNTER — Telehealth: Payer: Self-pay

## 2023-09-21 NOTE — Telephone Encounter (Signed)
 Called to confirm the patient got his Eliquis  2.5 mg tablets and is prepared to start tomorrow.   Left message to call back. Will confirm pre-Watchman visit 10/3.

## 2023-09-22 NOTE — Telephone Encounter (Signed)
 Left message to call back.

## 2023-09-22 NOTE — Telephone Encounter (Signed)
 Received call from [place], who has no idea if the patient is taking Xarelto . She did say she went to get Eliquis  and the drug store was out.  Reiterated to her that the patient should not take both Eliquis  and Xarelto . She will call when she gets home from work tonight and confirm med list.

## 2023-09-22 NOTE — Telephone Encounter (Signed)
 Received call from Summer at [facility] that the patient is on Xarelto  20 mg daily and has been, uninterrupted, since 2023. She said he picks up his prescription every time.  Summer confirmed Eliquis  has NOT been dispensed. Reiterated to her that Xarelto  is not in his medication list and that I have tried several times to call him. Informed her that when he calls back, will confirm he will stay on Xarelto  OR stop Xarelto  and start Eliquis  2.5 mg BID.  Still awaiting call back from patient. Both he and Heron have been called today.

## 2023-09-23 ENCOUNTER — Other Ambulatory Visit: Payer: Self-pay

## 2023-09-23 DIAGNOSIS — I10 Essential (primary) hypertension: Secondary | ICD-10-CM

## 2023-09-23 NOTE — Telephone Encounter (Signed)
 Spoke with Heron for almost an hour. Reviewed the patient's med list in detail.  There were several discrepancies. Of note, the patient is NOT taking: Ferrous sulfate  Gabapentin  Linzess  Melatonin Prednisone    Meds taken differently: The patient is taking Lasix  40 mg daily (med list indicated 20 mg daily PRN for fluid/edema The patient is taking potassium 10 meq 1 tablet once daily (med list indicated 2 tablets) The patient is taking hydralazine  25 mg TID (med list indicated 50 mg TID)  Added to med list: Insulin  Tresiba  14 units at bedtime   Med list updated.  Will route to Dr. Ladona as FYI.  The patient has an appointment 10/3 for pre-Watchman visit and labs (BMET, CBC) will be drawn. Maryann Heron and the patient to bring all PO meds to appointment for confirmation.

## 2023-09-23 NOTE — Telephone Encounter (Signed)
 Spoke with Heron, who said the patient is NOT taking Xarelto . There are several other medications she thinks he is not taking, but she is not home and cannot verify. Instructed her to go to [facility] to pick up Eliquis  2.5 mg BID and start ASAP in preparation for LAAO 10/22/2023. Reiterated to her the importance of calling when she gets home to review medication list.  Spoke with Alan at [facility]. Reiterated to her that the patient is not taking Xarelto  and to stop dispensing. Instructed her to dispense Eliquis  as prescribed. Informed her Heron is to call tonight to confirm what the patient is actually taking.  Will fax updated medication list to [place] at [phone].

## 2023-09-23 NOTE — Addendum Note (Signed)
 Addended by: Ovila Lepage A on: 09/23/2023 06:16 PM   Modules accepted: Orders

## 2023-09-23 NOTE — Addendum Note (Signed)
 Addended by: Dayna Geurts A on: 09/23/2023 06:30 PM   Modules accepted: Orders

## 2023-09-24 NOTE — Telephone Encounter (Signed)
 Super hard to know what he is taking and not sure his family keeps tab either. We have to go by what pharmacy is telling us .

## 2023-09-29 DIAGNOSIS — I10 Essential (primary) hypertension: Secondary | ICD-10-CM | POA: Diagnosis not present

## 2023-09-29 DIAGNOSIS — I4891 Unspecified atrial fibrillation: Secondary | ICD-10-CM | POA: Diagnosis not present

## 2023-09-29 DIAGNOSIS — F1320 Sedative, hypnotic or anxiolytic dependence, uncomplicated: Secondary | ICD-10-CM | POA: Diagnosis not present

## 2023-09-29 DIAGNOSIS — Z299 Encounter for prophylactic measures, unspecified: Secondary | ICD-10-CM | POA: Diagnosis not present

## 2023-10-02 DIAGNOSIS — Z299 Encounter for prophylactic measures, unspecified: Secondary | ICD-10-CM | POA: Diagnosis not present

## 2023-10-02 DIAGNOSIS — Z1331 Encounter for screening for depression: Secondary | ICD-10-CM | POA: Diagnosis not present

## 2023-10-02 DIAGNOSIS — Z7189 Other specified counseling: Secondary | ICD-10-CM | POA: Diagnosis not present

## 2023-10-02 DIAGNOSIS — Z0000 Encounter for general adult medical examination without abnormal findings: Secondary | ICD-10-CM | POA: Diagnosis not present

## 2023-10-02 DIAGNOSIS — E1142 Type 2 diabetes mellitus with diabetic polyneuropathy: Secondary | ICD-10-CM | POA: Diagnosis not present

## 2023-10-02 DIAGNOSIS — I10 Essential (primary) hypertension: Secondary | ICD-10-CM | POA: Diagnosis not present

## 2023-10-02 DIAGNOSIS — E1165 Type 2 diabetes mellitus with hyperglycemia: Secondary | ICD-10-CM | POA: Diagnosis not present

## 2023-10-02 DIAGNOSIS — F1320 Sedative, hypnotic or anxiolytic dependence, uncomplicated: Secondary | ICD-10-CM | POA: Diagnosis not present

## 2023-10-02 DIAGNOSIS — Z1339 Encounter for screening examination for other mental health and behavioral disorders: Secondary | ICD-10-CM | POA: Diagnosis not present

## 2023-10-05 DIAGNOSIS — I10 Essential (primary) hypertension: Secondary | ICD-10-CM | POA: Diagnosis not present

## 2023-10-05 DIAGNOSIS — E1169 Type 2 diabetes mellitus with other specified complication: Secondary | ICD-10-CM | POA: Diagnosis not present

## 2023-10-05 DIAGNOSIS — Z299 Encounter for prophylactic measures, unspecified: Secondary | ICD-10-CM | POA: Diagnosis not present

## 2023-10-06 ENCOUNTER — Telehealth: Payer: Self-pay

## 2023-10-06 NOTE — Telephone Encounter (Signed)
 The patient's friend and emergency contact Heron Simpers left a message requesting a paper copy of all the information for the patient's Watchman procedure since she would be the one helping get him to his appointment and preparing for the procedure. I called her back and we reviewed the date and time of the procedure but she had to leave for work, so I sent the [employer] instruction letter to the patient's address and asked her to call back when she received it if she had any further questions.

## 2023-10-09 ENCOUNTER — Encounter: Payer: Self-pay | Admitting: Student

## 2023-10-09 ENCOUNTER — Ambulatory Visit: Attending: Student | Admitting: Cardiology

## 2023-10-09 VITALS — BP 118/70 | HR 74 | Ht 69.0 in | Wt 195.7 lb

## 2023-10-09 DIAGNOSIS — Z79899 Other long term (current) drug therapy: Secondary | ICD-10-CM

## 2023-10-09 DIAGNOSIS — I480 Paroxysmal atrial fibrillation: Secondary | ICD-10-CM | POA: Diagnosis not present

## 2023-10-09 DIAGNOSIS — I10 Essential (primary) hypertension: Secondary | ICD-10-CM | POA: Diagnosis not present

## 2023-10-09 NOTE — Progress Notes (Signed)
  Electrophysiology Office Note:   Date:  10/09/2023  ID:  DWIGHT ADAMCZAK, DOB 1932-03-27, MRN [id number]  Primary Cardiologist: Gordy Bergamo, MD Electrophysiologist: Fonda Kitty, MD   Electrophysiologist:  Fonda Kitty, MD      History of Present Illness:   Brett Matthews is a 89 y.o. male with h/o paroxysmal atrial fibrillation, hypertension, hyperlipidemia, DM, CAD, GI bleeding with acute anemia seen today for follow up prior to scheduled Watchman closure with Dr. Kitty. Patient presents with his significant other's daughter.  Since last being seen in our clinic the patient reports doing well.  Upon discussion of patient's medications, in particular Eliquis , patient and companion unsure of compliance (though companion clarifies that her mom/patient's significant other consistently manages patient medications). he denies chest pain, palpitations, dyspnea, PND, orthopnea, nausea, vomiting, dizziness, syncope, edema, weight gain, or early satiety.   Review of systems complete and found to be negative unless listed in HPI.   EP Information / Studies Reviewed:    EKG is ordered today. Personal review as below.  EKG Interpretation Date/Time:  Friday October 09 2023 10:36:37 EDT Ventricular Rate:  74 PR Interval:  170 QRS Duration:  150 QT Interval:  436 QTC Calculation: 483 R Axis:   -81  Text Interpretation: Normal sinus rhythm with sinus arrhythmia Right bundle branch block Left anterior fascicular block Bifascicular block When compared with ECG of 17-Jul-2023 15:03, No significant change was found Confirmed by Trudy Birmingham [phone]) on 10/09/2023 10:39:27 AM    Arrhythmia/Device History No specialty comments available.   Physical Exam:   VS:  BP 118/70   Pulse 74   Ht 5' 9 (1.753 m)   Wt 195 lb 11.2 oz (88.8 kg)   SpO2 97%   BMI 28.90 kg/m    Wt Readings from Last 3 Encounters:  10/09/23 195 lb 11.2 oz (88.8 kg)  07/31/23 191 lb (86.6 kg)  07/29/23 187 lb 3.2 oz (84.9 kg)      GEN: No acute distress NECK: No JVD; No carotid bruits CARDIAC: Regular rate and rhythm, no murmurs, rubs, gallops RESPIRATORY:  Clear to auscultation without rales, wheezing or rhonchi  ABDOMEN: Soft, non-tender, non-distended EXTREMITIES:  No edema; No deformity   ASSESSMENT AND PLAN:    Paroxysmal atrial fibrillation Secondary hypercoagulable state Patient pending Watchman closure with Dr. Kitty on 10/22/23 given that he is not a long-term OAC candidate with GI bleeding of unidentified source and CHA2DS2-VASc score of 5/unadjusted ischemic stroke rate of 7.2% per year. Patient will continue Eliquis  2.5mg  BID until 6 weeks post-procedure. Will have Watchman team follow up with patient prior to procedure to ensure compliance.   Coronary artery disease Hyperlipidemia No recent anginal symptoms. Continue Atorvastatin  20mg  per general cardiology.   Hypertension Patient with well controlled blood pressure today. Continue current regimen.       Follow up with EP APP as usual post procedure  Signed, Birmingham Trudy, PA-C

## 2023-10-09 NOTE — Patient Instructions (Signed)
 Medication Instructions:  Your physician recommends that you continue on your current medications as directed. Please refer to the Current Medication list given to you today.  *If you need a refill on your cardiac medications before your next appointment, please call your pharmacy*  Lab Work: BMET, CBC-TODAY If you have labs (blood work) drawn today and your tests are completely normal, you will receive your results only by: MyChart Message (if you have MyChart) OR A paper copy in the mail If you have any lab test that is abnormal or we need to change your treatment, we will call you to review the results.  Testing/Procedures: See letter  Follow-Up: At [facility], you and your health needs are our priority.  As part of our continuing mission to provide you with exceptional heart care, our providers are all part of one team.  This team includes your primary Cardiologist (physician) and Advanced Practice Providers or APPs (Physician Assistants and Nurse Practitioners) who all work together to provide you with the care you need, when you need it.  Your next appointment:   As scheduled

## 2023-10-10 LAB — BASIC METABOLIC PANEL WITH GFR
BUN/Creatinine Ratio: 15 (ref 10–24)
BUN: 20 mg/dL (ref 8–27)
CO2: 26 mmol/L (ref 20–29)
Calcium: 10.4 mg/dL — ABNORMAL HIGH (ref 8.6–10.2)
Chloride: 99 mmol/L (ref 96–106)
Creatinine, Ser: 1.33 mg/dL — ABNORMAL HIGH (ref 0.76–1.27)
Glucose: 164 mg/dL — ABNORMAL HIGH (ref 70–99)
Potassium: 4.6 mmol/L (ref 3.5–5.2)
Sodium: 138 mmol/L (ref 134–144)
eGFR: 51 mL/min/1.73 — ABNORMAL LOW (ref >59)

## 2023-10-10 LAB — CBC
Hematocrit: 37.2 % — ABNORMAL LOW (ref 37.5–51.0)
Hemoglobin: 11.8 g/dL — ABNORMAL LOW (ref 13.0–17.7)
MCH: 29.1 pg (ref 26.6–33.0)
MCHC: 31.7 g/dL (ref 31.5–35.7)
MCV: 92 fL (ref 79–97)
Platelets: 255 x10E3/uL (ref 150–450)
RBC: 4.06 x10E6/uL — ABNORMAL LOW (ref 4.14–5.80)
RDW: 12.7 % (ref 11.6–15.4)
WBC: 8.2 x10E3/uL (ref 3.4–10.8)

## 2023-10-13 ENCOUNTER — Encounter: Payer: Self-pay | Admitting: Cardiology

## 2023-10-13 ENCOUNTER — Ambulatory Visit: Attending: Cardiology | Admitting: Cardiology

## 2023-10-13 VITALS — BP 92/46 | HR 54 | Ht 69.0 in | Wt 193.0 lb

## 2023-10-13 DIAGNOSIS — Z5309 Procedure and treatment not carried out because of other contraindication: Secondary | ICD-10-CM | POA: Diagnosis not present

## 2023-10-13 DIAGNOSIS — D500 Iron deficiency anemia secondary to blood loss (chronic): Secondary | ICD-10-CM | POA: Diagnosis not present

## 2023-10-13 DIAGNOSIS — D6869 Other thrombophilia: Secondary | ICD-10-CM | POA: Diagnosis not present

## 2023-10-13 DIAGNOSIS — I480 Paroxysmal atrial fibrillation: Secondary | ICD-10-CM | POA: Diagnosis not present

## 2023-10-13 NOTE — Patient Instructions (Signed)
 Medication Instructions:  The current medical regimen is effective;  continue present plan and medications.  *If you need a refill on your cardiac medications before your next appointment, please call your pharmacy*  Follow-Up: At [facility], you and your health needs are our priority.  As part of our continuing mission to provide you with exceptional heart care, our providers are all part of one team.  This team includes your primary Cardiologist (physician) and Advanced Practice Providers or APPs (Physician Assistants and Nurse Practitioners) who all work together to provide you with the care you need, when you need it.  Your next appointment:   6 month(s)  Provider:   Knox Perl, MD    We recommend signing up for the patient portal called "MyChart".  Sign up information is provided on this After Visit Summary.  MyChart is used to connect with patients for Virtual Visits (Telemedicine).  Patients are able to view lab/test results, encounter notes, upcoming appointments, etc.  Non-urgent messages can be sent to your provider as well.   To learn more about what you can do with MyChart, go to [email or web address].

## 2023-10-13 NOTE — Progress Notes (Signed)
 Cardiology Office Note:  .   Date:  10/13/2023  ID:  Brett Matthews, DOB 1932-02-21, MRN [id number] PCP: Rosamond Leta NOVAK, MD  [facility] Providers Cardiologist:  Gordy Bergamo, MD Electrophysiologist:  Fonda Kitty, MD   History of Present Illness: .   Brett Matthews is a 89 y.o.  : .   Brett Matthews is a 89 y.o. Caucasian male with paroxysmal atrial fibrillation, hypertension, hyperlipidemia, controlled diabetes mellitus, moderate coronary artery disease in mid circumflex with 60% stenosis by angiography in 2013.  He has had a Lexiscan  nuclear stress test on 12/27/2018 which revealed normal perfusion with ejection fraction of 58% and echocardiogram on 05/16/2023 revealed normal LVEF at 50 to 55% otherwise no significant abnormality.  He was admitted for acute blood loss anemia 04/30/2023 and EGD on 05/21/2023 revealing congestive gastropathy for which he underwent clip placement.  Colonoscopy revealed nonbleeding internal hemorrhoids.  Received IV iron therapy.  Also found to have diverticulosis of the colon.  He has been evaluated by EP and has been scheduled for Watchman device implantation on 10/22/2023 by Dr. Fonda Kitty.  He has been compliant with anticoagulation medications.  No specific complaints today.  Cardiac Studies relevent.       Discussed the use of AI scribe software for clinical note transcription with the patient, who gave verbal consent to proceed.  History of Present Illness Brett Matthews is an 89 year old male with paroxysmal atrial fibrillation who presents for a pre-procedure consultation for a Watchman device placement. He is accompanied by a friend. He was referred by Dr. Fonda Kitty for the procedure.  Atrial fibrillation and stroke risk - Paroxysmal atrial fibrillation with high risk for thromboembolic stroke - Scheduled for Watchman device placement next Thursday as a stroke prevention strategy  Anticoagulation therapy - Currently taking Eliquis   2.5 mg twice daily, which is essential for the upcoming procedure - Friend assists with medication adherence  Gastrointestinal bleeding and anemia - History of multiple episodes of gastrointestinal bleeding - Chronic blood loss anemia  Cardiac rate control - On carvedilol  25 mg twice daily for rate control   Labs   Lab Results  Component Value Date   CHOL 184 06/27/2011   HDL 44 06/27/2011   LDLCALC 103 (H) 06/27/2011   TRIG 130 08/18/2019   CHOLHDL 4.2 06/27/2011   No results found for: LIPOA  Recent Labs    05/19/23 1031 05/21/23 0410 05/21/23 1823 05/22/23 0447 06/10/23 1604 07/31/23 1519 10/09/23 1137  NA 137 137  --  139 139 140 138  K 4.4 3.3*   < > 3.9 3.6 4.2 4.6  CL 106 102  --  106 100 100 99  CO2 24 24  --  25 23 25 26   GLUCOSE 96 160*  --  148* 151* 81 164*  BUN 6* <5*  --  8 11 15 20   CREATININE 1.03 0.90  --  0.89 0.88 1.08 1.33*  CALCIUM  9.5 9.0  --  9.1 9.5 10.0 10.4*  GFRNONAA >60 >60  --  >60  --   --   --    < > = values in this interval not displayed.    Lab Results  Component Value Date   ALT 25 06/10/2023   AST 26 06/10/2023   ALKPHOS 154 (H) 06/10/2023   BILITOT 0.6 06/10/2023      Latest Ref Rng & Units 10/09/2023   11:37 AM 06/10/2023    4:04 PM 06/04/2023  1:04 PM  CBC  WBC 3.4 - 10.8 x10E3/uL 8.2  7.6  9.0   Hemoglobin 13.0 - 17.7 g/dL 88.1  9.0  8.2   Hematocrit 37.5 - 51.0 % 37.2  31.5  28.1   Platelets 150 - 450 x10E3/uL 255  382  450    Lab Results  Component Value Date   HGBA1C 6.3 (H) 05/15/2023    Lab Results  Component Value Date   TSH 3.590 06/10/2023    ROS  Review of Systems  Cardiovascular:  Negative for chest pain, dyspnea on exertion and leg swelling.   Physical Exam:   VS:  BP (!) 92/46 (BP Location: Right Arm, Patient Position: Sitting, Cuff Size: Normal)   Pulse (!) 54   Ht 5' 9 (1.753 m)   Wt 193 lb (87.5 kg)   SpO2 97%   BMI 28.50 kg/m    Wt Readings from Last 3 Encounters:  10/13/23 193 lb  (87.5 kg)  10/09/23 195 lb 11.2 oz (88.8 kg)  07/31/23 191 lb (86.6 kg)    BP Readings from Last 3 Encounters:  10/13/23 (!) 92/46  10/09/23 118/70  08/12/23 133/69   Physical Exam Neck:     Vascular: No carotid bruit or JVD.  Cardiovascular:     Rate and Rhythm: Normal rate and regular rhythm.     Pulses: Intact distal pulses.     Heart sounds: Normal heart sounds. No murmur heard.    No gallop.  Pulmonary:     Effort: Pulmonary effort is normal.     Breath sounds: Normal breath sounds.  Abdominal:     General: Bowel sounds are normal.     Palpations: Abdomen is soft.  Musculoskeletal:     Right lower leg: No edema.     Left lower leg: No edema.    EKG:         ASSESSMENT AND PLAN: .      ICD-10-CM   1. Paroxysmal atrial fibrillation (HCC)  I48.0     2. Hypercoagulable state due to paroxysmal atrial fibrillation (HCC)  D68.69    I48.0     3. Contraindication to anticoagulation therapy  Z53.09     4. Chronic blood loss anemia  D50.0      Assessment & Plan Paroxysmal atrial fibrillation with high stroke risk and contraindication to anticoagulation due to recurrent gastrointestinal bleeding Paroxysmal atrial fibrillation with high stroke risk. Anticoagulation is contraindicated due to recurrent gastrointestinal bleeding. Regular rhythm observed today. - Continue carvedilol  25 mg twice daily - Continue Eliquis  2.5 mg twice daily - Proceed with Watchman device procedure - Today he is probably in sinus rhythm with regular heartbeat and rhythm.  Chronic iron deficiency anemia secondary to gastrointestinal blood loss Chronic iron deficiency anemia due to recurrent gastrointestinal bleeding.  Planned Watchman device procedure for stroke prevention in atrial fibrillation Watchman device procedure planned for stroke prevention in atrial fibrillation with contraindication to anticoagulation. Scheduled for next Thursday. The procedure aims to reduce stroke risk while  avoiding anticoagulation complications. - Proceed with Watchman device procedure next Thursday - Ensure continuation of blood thinners until the procedure   Follow up: 6 months for PAF  Signed,  Gordy Bergamo, MD, [facility] 10/13/2023, 7:13 PM [facility] [address] [place], [place] [address] Phone: [phone]. Fax:  [phone]

## 2023-10-17 ENCOUNTER — Ambulatory Visit: Payer: Self-pay | Admitting: Cardiology

## 2023-10-20 ENCOUNTER — Telehealth: Payer: Self-pay

## 2023-10-20 NOTE — Telephone Encounter (Signed)
 Confirmed procedure date of 10/22/2023. Confirmed arrival time of 1015 for procedure time at 1245. Reviewed pre-procedure instructions with patient. Contrast allergy? No PPM or defibrillator? No The patient understands to call if questions/concerns arise prior to procedure. The patient was grateful for call and agreed with plan.    Called to speak with Brett Matthews and confirm everything with her.  Left message to call back.

## 2023-10-21 NOTE — Telephone Encounter (Signed)
 Attempted to call Heron again.  Left message to call back.

## 2023-10-21 NOTE — Telephone Encounter (Signed)
 Spoke with Heron. All instructions reviewed. She was grateful for call and agreed with plan.

## 2023-10-22 ENCOUNTER — Inpatient Hospital Stay (HOSPITAL_COMMUNITY)

## 2023-10-22 ENCOUNTER — Encounter (HOSPITAL_COMMUNITY): Payer: Self-pay | Admitting: Cardiology

## 2023-10-22 ENCOUNTER — Inpatient Hospital Stay (HOSPITAL_COMMUNITY)
Admission: RE | Disposition: A | Discharge status: Home / Self Care | Payer: Self-pay | Source: Home / Self Care | Attending: Cardiology

## 2023-10-22 ENCOUNTER — Ambulatory Visit (HOSPITAL_COMMUNITY)

## 2023-10-22 ENCOUNTER — Inpatient Hospital Stay (HOSPITAL_COMMUNITY)
Admission: RE | Admit: 2023-10-22 | Discharge: 2023-10-23 | DRG: 274 | Disposition: A | Discharge status: Home / Self Care | Attending: Cardiology | Admitting: Cardiology

## 2023-10-22 DIAGNOSIS — I10 Essential (primary) hypertension: Secondary | ICD-10-CM

## 2023-10-22 DIAGNOSIS — J9601 Acute respiratory failure with hypoxia: Secondary | ICD-10-CM | POA: Diagnosis not present

## 2023-10-22 DIAGNOSIS — I071 Rheumatic tricuspid insufficiency: Secondary | ICD-10-CM | POA: Diagnosis present

## 2023-10-22 DIAGNOSIS — Z95818 Presence of other cardiac implants and grafts: Secondary | ICD-10-CM | POA: Diagnosis not present

## 2023-10-22 DIAGNOSIS — I77810 Thoracic aortic ectasia: Secondary | ICD-10-CM | POA: Diagnosis present

## 2023-10-22 DIAGNOSIS — I2510 Atherosclerotic heart disease of native coronary artery without angina pectoris: Secondary | ICD-10-CM | POA: Diagnosis present

## 2023-10-22 DIAGNOSIS — E119 Type 2 diabetes mellitus without complications: Secondary | ICD-10-CM | POA: Diagnosis present

## 2023-10-22 DIAGNOSIS — I480 Paroxysmal atrial fibrillation: Secondary | ICD-10-CM

## 2023-10-22 DIAGNOSIS — I3481 Nonrheumatic mitral (valve) annulus calcification: Secondary | ICD-10-CM | POA: Diagnosis present

## 2023-10-22 DIAGNOSIS — E785 Hyperlipidemia, unspecified: Secondary | ICD-10-CM | POA: Diagnosis present

## 2023-10-22 DIAGNOSIS — Z006 Encounter for examination for normal comparison and control in clinical research program: Secondary | ICD-10-CM | POA: Diagnosis not present

## 2023-10-22 DIAGNOSIS — Z79899 Other long term (current) drug therapy: Secondary | ICD-10-CM

## 2023-10-22 DIAGNOSIS — D6869 Other thrombophilia: Secondary | ICD-10-CM | POA: Diagnosis present

## 2023-10-22 DIAGNOSIS — I110 Hypertensive heart disease with heart failure: Secondary | ICD-10-CM | POA: Diagnosis present

## 2023-10-22 DIAGNOSIS — K7460 Unspecified cirrhosis of liver: Secondary | ICD-10-CM | POA: Diagnosis present

## 2023-10-22 DIAGNOSIS — I4891 Unspecified atrial fibrillation: Principal | ICD-10-CM | POA: Diagnosis present

## 2023-10-22 DIAGNOSIS — Z7901 Long term (current) use of anticoagulants: Secondary | ICD-10-CM

## 2023-10-22 DIAGNOSIS — K922 Gastrointestinal hemorrhage, unspecified: Secondary | ICD-10-CM | POA: Diagnosis not present

## 2023-10-22 DIAGNOSIS — D500 Iron deficiency anemia secondary to blood loss (chronic): Secondary | ICD-10-CM

## 2023-10-22 HISTORY — PX: LEFT ATRIAL APPENDAGE OCCLUSION: EP1229

## 2023-10-22 HISTORY — DX: Presence of other cardiac implants and grafts: Z95.818

## 2023-10-22 HISTORY — PX: TRANSESOPHAGEAL ECHOCARDIOGRAM (CATH LAB): EP1270

## 2023-10-22 LAB — TYPE AND SCREEN
ABO/RH(D): O POS
Antibody Screen: NEGATIVE

## 2023-10-22 LAB — CBC
HCT: 35.1 % — ABNORMAL LOW (ref 39.0–52.0)
Hemoglobin: 11.4 g/dL — ABNORMAL LOW (ref 13.0–17.0)
MCH: 29.2 pg (ref 26.0–34.0)
MCHC: 32.5 g/dL (ref 30.0–36.0)
MCV: 90 fL (ref 80.0–100.0)
Platelets: 222 K/uL (ref 150–400)
RBC: 3.9 MIL/uL — ABNORMAL LOW (ref 4.22–5.81)
RDW: 13.4 % (ref 11.5–15.5)
WBC: 7.8 K/uL (ref 4.0–10.5)
nRBC: 0 % (ref 0.0–0.2)

## 2023-10-22 LAB — GLUCOSE, CAPILLARY
Glucose-Capillary: 166 mg/dL — ABNORMAL HIGH (ref 70–99)
Glucose-Capillary: 165 mg/dL — ABNORMAL HIGH (ref 70–99)
Glucose-Capillary: 142 mg/dL — ABNORMAL HIGH (ref 70–99)
Glucose-Capillary: 142 mg/dL — ABNORMAL HIGH (ref 70–99)
Glucose-Capillary: 248 mg/dL — ABNORMAL HIGH (ref 70–99)
Glucose-Capillary: 283 mg/dL — ABNORMAL HIGH (ref 70–99)

## 2023-10-22 LAB — BASIC METABOLIC PANEL WITH GFR
Anion gap: 12 (ref 5–15)
BUN: 23 mg/dL (ref 8–23)
CO2: 23 mmol/L (ref 22–32)
Calcium: 9.2 mg/dL (ref 8.9–10.3)
Chloride: 103 mmol/L (ref 98–111)
Creatinine, Ser: 1.16 mg/dL (ref 0.61–1.24)
GFR, Estimated: >60 mL/min (ref >60)
Glucose, Bld: 169 mg/dL — ABNORMAL HIGH (ref 70–99)
Potassium: 3.9 mmol/L (ref 3.5–5.1)
Sodium: 138 mmol/L (ref 135–145)

## 2023-10-22 LAB — SURGICAL PCR SCREEN
MRSA, PCR: NEGATIVE
Staphylococcus aureus: NEGATIVE

## 2023-10-22 LAB — POCT ACTIVATED CLOTTING TIME: Activated Clotting Time: 268 s

## 2023-10-22 LAB — ECHO TEE

## 2023-10-22 MED ORDER — FENTANYL CITRATE (PF) 250 MCG/5ML IJ SOLN
INTRAMUSCULAR | Status: DC | PRN
  Administered 2023-10-22: 50 ug via INTRAVENOUS

## 2023-10-22 MED ORDER — ATORVASTATIN CALCIUM 10 MG PO TABS
20.0000 mg | ORAL_TABLET | Freq: Every day | ORAL | Status: DC
  Administered 2023-10-22 – 2023-10-23 (×2): 20 mg via ORAL
  Filled 2023-10-22 (×2): qty 2

## 2023-10-22 MED ORDER — SODIUM CHLORIDE 0.9 % IV SOLN: 250.0000 mL | INTRAVENOUS | Status: DC | PRN

## 2023-10-22 MED ORDER — FAMOTIDINE 20 MG PO TABS
40.0000 mg | ORAL_TABLET | Freq: Every day | ORAL | Status: DC
  Administered 2023-10-22 – 2023-10-23 (×2): 40 mg via ORAL
  Filled 2023-10-22 (×2): qty 2

## 2023-10-22 MED ORDER — FENTANYL CITRATE (PF) 100 MCG/2ML IJ SOLN
INTRAMUSCULAR | Status: AC
  Filled 2023-10-22: qty 2

## 2023-10-22 MED ORDER — CEFAZOLIN SODIUM-DEXTROSE 2-4 GM/100ML-% IV SOLN
INTRAVENOUS | Status: AC
  Filled 2023-10-22: qty 100

## 2023-10-22 MED ORDER — CHLORHEXIDINE GLUCONATE 0.12 % MT SOLN
OROMUCOSAL | Status: AC
  Administered 2023-10-22: 15 mL
  Filled 2023-10-22: qty 15

## 2023-10-22 MED ORDER — CARVEDILOL 25 MG PO TABS
25.0000 mg | ORAL_TABLET | Freq: Two times a day (BID) | ORAL | Status: DC
  Administered 2023-10-22 – 2023-10-23 (×2): 25 mg via ORAL
  Filled 2023-10-22 (×2): qty 1

## 2023-10-22 MED ORDER — DOCUSATE SODIUM 100 MG PO CAPS
100.0000 mg | ORAL_CAPSULE | Freq: Two times a day (BID) | ORAL | Status: DC
  Administered 2023-10-22 – 2023-10-23 (×2): 100 mg via ORAL
  Filled 2023-10-22 (×2): qty 1

## 2023-10-22 MED ORDER — INSULIN ASPART 100 UNIT/ML IJ SOLN
0.0000 [IU] | Freq: Three times a day (TID) | INTRAMUSCULAR | Status: DC
  Administered 2023-10-23: 3 [IU] via SUBCUTANEOUS

## 2023-10-22 MED ORDER — PHENYLEPHRINE HCL-NACL 20-0.9 MG/250ML-% IV SOLN
INTRAVENOUS | Status: DC | PRN
  Administered 2023-10-22: 10 ug/min via INTRAVENOUS

## 2023-10-22 MED ORDER — DEXAMETHASONE SOD PHOSPHATE PF 10 MG/ML IJ SOLN
INTRAMUSCULAR | Status: DC | PRN
  Administered 2023-10-22: 10 mg via INTRAVENOUS

## 2023-10-22 MED ORDER — HEPARIN (PORCINE) IN NACL 2000-0.9 UNIT/L-% IV SOLN
INTRAVENOUS | Status: DC | PRN
  Administered 2023-10-22: 1000 mL

## 2023-10-22 MED ORDER — SODIUM CHLORIDE 0.9% FLUSH
3.0000 mL | Freq: Two times a day (BID) | INTRAVENOUS | Status: DC
  Administered 2023-10-22 – 2023-10-23 (×2): 3 mL via INTRAVENOUS

## 2023-10-22 MED ORDER — SODIUM CHLORIDE 0.9 % IV SOLN: INTRAVENOUS | Status: DC

## 2023-10-22 MED ORDER — SODIUM CHLORIDE 0.9% FLUSH: 3.0000 mL | INTRAVENOUS | Status: DC | PRN

## 2023-10-22 MED ORDER — ALBUTEROL SULFATE HFA 108 (90 BASE) MCG/ACT IN AERS
2.0000 | INHALATION_SPRAY | RESPIRATORY_TRACT | Status: DC | PRN
Start: 2023-10-22 — End: 2023-10-22

## 2023-10-22 MED ORDER — LEVOCETIRIZINE DIHYDROCHLORIDE 5 MG PO TABS: 5.0000 mg | ORAL_TABLET | Freq: Every day | ORAL | Status: DC

## 2023-10-22 MED ORDER — ONDANSETRON HCL 4 MG/2ML IJ SOLN
INTRAMUSCULAR | Status: DC | PRN
  Administered 2023-10-22: 4 mg via INTRAVENOUS

## 2023-10-22 MED ORDER — IOHEXOL 350 MG/ML SOLN
INTRAVENOUS | Status: DC | PRN
  Administered 2023-10-22: 12 mL

## 2023-10-22 MED ORDER — LACTATED RINGERS IV SOLN: INTRAVENOUS | Status: DC

## 2023-10-22 MED ORDER — ALBUTEROL SULFATE (2.5 MG/3ML) 0.083% IN NEBU: 2.5000 mg | INHALATION_SOLUTION | RESPIRATORY_TRACT | Status: DC | PRN

## 2023-10-22 MED ORDER — CHLORHEXIDINE GLUCONATE 4 % EX SOLN
Freq: Once | CUTANEOUS | Status: DC
  Filled 2023-10-22: qty 15

## 2023-10-22 MED ORDER — CEFAZOLIN SODIUM-DEXTROSE 2-4 GM/100ML-% IV SOLN
2.0000 g | INTRAVENOUS | Status: AC
  Administered 2023-10-22: 2 g via INTRAVENOUS

## 2023-10-22 MED ORDER — PANTOPRAZOLE SODIUM 40 MG PO TBEC: 40.0000 mg | DELAYED_RELEASE_TABLET | Freq: Every day | ORAL | Status: DC

## 2023-10-22 MED ORDER — HYDRALAZINE HCL 25 MG PO TABS
25.0000 mg | ORAL_TABLET | Freq: Three times a day (TID) | ORAL | Status: AC
Start: 2023-10-22 — End: ?
  Administered 2023-10-22 – 2023-10-23 (×2): 25 mg via ORAL
  Filled 2023-10-22 (×2): qty 1

## 2023-10-22 MED ORDER — ONDANSETRON HCL 4 MG/2ML IJ SOLN: 4.0000 mg | Freq: Four times a day (QID) | INTRAMUSCULAR | Status: DC | PRN

## 2023-10-22 MED ORDER — INSULIN GLARGINE-YFGN 100 UNIT/ML ~~LOC~~ SOLN
7.0000 [IU] | Freq: Every day | SUBCUTANEOUS | Status: DC
  Administered 2023-10-22: 7 [IU] via SUBCUTANEOUS
  Filled 2023-10-22 (×2): qty 0.07

## 2023-10-22 MED ORDER — INSULIN ASPART 100 UNIT/ML IJ SOLN: 0.0000 [IU] | INTRAMUSCULAR | Status: DC | PRN

## 2023-10-22 MED ORDER — LIDOCAINE 2% (20 MG/ML) 5 ML SYRINGE
INTRAMUSCULAR | Status: DC | PRN
  Administered 2023-10-22: 100 mg via INTRAVENOUS

## 2023-10-22 MED ORDER — SUGAMMADEX SODIUM 200 MG/2ML IV SOLN
INTRAVENOUS | Status: DC | PRN
  Administered 2023-10-22: 175 mg via INTRAVENOUS

## 2023-10-22 MED ORDER — FOLIC ACID 1 MG PO TABS
1.0000 mg | ORAL_TABLET | Freq: Every day | ORAL | Status: DC
  Administered 2023-10-23: 1 mg via ORAL
  Filled 2023-10-22: qty 1

## 2023-10-22 MED ORDER — HEPARIN SODIUM (PORCINE) 1000 UNIT/ML IJ SOLN
INTRAMUSCULAR | Status: DC | PRN
  Administered 2023-10-22: 14000 [IU] via INTRAVENOUS
  Administered 2023-10-22: 4000 [IU] via INTRAVENOUS

## 2023-10-22 MED ORDER — METFORMIN HCL 500 MG PO TABS: 1000.0000 mg | ORAL_TABLET | Freq: Every day | ORAL | Status: DC

## 2023-10-22 MED ORDER — INSULIN DEGLUDEC 100 UNIT/ML ~~LOC~~ SOPN: 7.0000 [IU] | PEN_INJECTOR | Freq: Every day | SUBCUTANEOUS | Status: DC

## 2023-10-22 MED ORDER — METFORMIN HCL 500 MG PO TABS: 500.0000 mg | ORAL_TABLET | Freq: Every day | ORAL | Status: DC

## 2023-10-22 MED ORDER — LORATADINE 10 MG PO TABS
10.0000 mg | ORAL_TABLET | Freq: Every day | ORAL | Status: DC
  Administered 2023-10-22: 10 mg via ORAL
  Filled 2023-10-22: qty 1

## 2023-10-22 MED ORDER — APIXABAN 2.5 MG PO TABS
2.5000 mg | ORAL_TABLET | Freq: Two times a day (BID) | ORAL | Status: DC
  Administered 2023-10-23: 2.5 mg via ORAL
  Filled 2023-10-22: qty 1

## 2023-10-22 MED ORDER — PROTAMINE SULFATE 10 MG/ML IV SOLN
INTRAVENOUS | Status: DC | PRN
  Administered 2023-10-22: 35 mg via INTRAVENOUS

## 2023-10-22 MED ORDER — INSULIN ASPART 100 UNIT/ML IJ SOLN
3.0000 [IU] | Freq: Three times a day (TID) | INTRAMUSCULAR | Status: DC
  Administered 2023-10-23: 3 [IU] via SUBCUTANEOUS

## 2023-10-22 MED ORDER — VITAMIN D 25 MCG (1000 UNIT) PO TABS
1000.0000 [IU] | ORAL_TABLET | Freq: Every day | ORAL | Status: DC
  Administered 2023-10-23: 1000 [IU] via ORAL
  Filled 2023-10-22: qty 1

## 2023-10-22 MED ORDER — PHENYLEPHRINE 80 MCG/ML (10ML) SYRINGE FOR IV PUSH (FOR BLOOD PRESSURE SUPPORT)
PREFILLED_SYRINGE | INTRAVENOUS | Status: DC | PRN
  Administered 2023-10-22: 80 ug via INTRAVENOUS

## 2023-10-22 MED ORDER — ROCURONIUM BROMIDE 10 MG/ML (PF) SYRINGE
PREFILLED_SYRINGE | INTRAVENOUS | Status: DC | PRN
  Administered 2023-10-22: 50 mg via INTRAVENOUS

## 2023-10-22 MED ORDER — GLIMEPIRIDE 2 MG PO TABS
2.0000 mg | ORAL_TABLET | Freq: Two times a day (BID) | ORAL | Status: DC
  Administered 2023-10-22: 2 mg via ORAL
  Filled 2023-10-22 (×2): qty 1

## 2023-10-22 MED ORDER — LABETALOL HCL 5 MG/ML IV SOLN
INTRAVENOUS | Status: DC | PRN
  Administered 2023-10-22 (×2): 10 mg via INTRAVENOUS

## 2023-10-22 MED ORDER — DILTIAZEM HCL ER COATED BEADS 240 MG PO CP24
240.0000 mg | ORAL_CAPSULE | Freq: Every day | ORAL | Status: DC
  Administered 2023-10-23: 240 mg via ORAL
  Filled 2023-10-22: qty 1

## 2023-10-22 MED ORDER — TAMSULOSIN HCL 0.4 MG PO CAPS
0.4000 mg | ORAL_CAPSULE | Freq: Every day | ORAL | Status: AC
Start: 2023-10-23 — End: ?

## 2023-10-22 MED ORDER — APIXABAN 2.5 MG PO TABS
2.5000 mg | ORAL_TABLET | Freq: Once | ORAL | Status: AC
  Administered 2023-10-22: 2.5 mg via ORAL
  Filled 2023-10-22: qty 1

## 2023-10-22 MED ORDER — CITALOPRAM HYDROBROMIDE 20 MG PO TABS
40.0000 mg | ORAL_TABLET | Freq: Every day | ORAL | Status: DC
  Administered 2023-10-23: 40 mg via ORAL
  Filled 2023-10-22: qty 2

## 2023-10-22 MED ORDER — PROPOFOL 10 MG/ML IV BOLUS
INTRAVENOUS | Status: DC | PRN
  Administered 2023-10-22: 100 mg via INTRAVENOUS
  Administered 2023-10-22: 20 mg via INTRAVENOUS

## 2023-10-22 MED ORDER — LEVOTHYROXINE SODIUM 75 MCG PO TABS
125.0000 ug | ORAL_TABLET | Freq: Every day | ORAL | Status: DC
  Administered 2023-10-23: 125 ug via ORAL
  Filled 2023-10-22: qty 1

## 2023-10-22 MED ORDER — ACETAMINOPHEN 325 MG PO TABS: 650.0000 mg | ORAL_TABLET | ORAL | Status: DC | PRN

## 2023-10-22 NOTE — Discharge Instructions (Signed)
 WATCHMANT Procedure, Care After  Procedure MD: Dr. Alene Mires Clinical Coordinator: Karsten Fells, RN  This sheet gives you information about how to care for yourself after your procedure. Your health care provider may also give you more specific instructions. If you have problems or questions, contact your health care provider.  What can I expect after the procedure? After the procedure, it is common to have: Bruising around your puncture site. Tenderness around your puncture site. Tiredness (fatigue).  Medication instructions It is very important to continue to take your blood thinner as directed by your doctor after the Watchman procedure. Call your procedure doctor's office with question or concerns. If you are on Coumadin (warfarin), you will have your INR checked the week after your procedure, with a goal INR of 2.0 - 3.0. Please follow your medication instructions on your discharge summary. Only take the medications listed on your discharge paperwork.  Follow up You will be seen in 6 weeks after your procedure You will have a repeat CT scan or Echocardiogram approximately 8 weeks after your procedure mark to check your device You will follow up the MD/APP who performed your procedure 6 months after your procedure The Watchman Clinical Coordinator will check in with you from time to time, including 1 and 2 years after your procedure.  NO DENTAL CLEANINGS FOR 45 days. After that, you will require antibiotics for dental procedures the first 6 months.   Follow these instructions at home: Puncture site care  Follow instructions from your health care provider about how to take care of your puncture site. Make sure you: If present, leave stitches (sutures), skin glue, or adhesive strips in place.  If a large square bandage is present, this may be removed 24 hours after surgery.  Check your puncture site every day for signs of infection. Check for: Redness, swelling, or pain. Fluid  or blood. If your puncture site starts to bleed, lie down on your back, apply firm pressure to the area, and contact your health care provider. Warmth. Pus or a bad smell. Driving Do not drive yourself home if you received sedation Do not drive for at least 4 days after your procedure or however long your health care provider recommends. (Do not resume driving if you have previously been instructed not to drive for other health reasons.) Do not spend greater than 1 hour at a time in a car for the first 3 days. Stop and take a break with a 5 minute walk at least every hour.  Do not drive or use heavy machinery while taking prescription pain medicine.  Activity Avoid activities that take a lot of effort, including exercise, for at least 7 days after your procedure. For the first 3 days, avoid sitting for longer than one hour at a time.  Avoid alcoholic beverages, signing paperwork, or participating in legal proceedings for 24 hours after receiving sedation Do not lift anything that is heavier than 10 lb (4.5 kg) for one week.  No sexual activity for 1 week.  Return to your normal activities as told by your health care provider. Ask your health care provider what activities are safe for you. General instructions Take over-the-counter and prescription medicines only as told by your health care provider. Do not use any products that contain nicotine or tobacco, such as cigarettes and e-cigarettes. If you need help quitting, ask your health care provider. You may shower after 24 hours, but Do not take baths, swim, or use a hot tub for  1 week.  Do not drink alcohol for 24 hours after your procedure. Keep all follow-up visits as told by your health care provider. This is important. Dental Work: You will require antibiotics prior to any dental work, including cleanings, for 6 months after your Watchman implantation to help protect you from infection. After 6 months, antibiotics are no longer  required. Contact a health care provider if: You have redness, mild swelling, or pain around your puncture site. You have soreness in your throat or at your puncture site that does not improve after several days You have fluid or blood coming from your puncture site that stops after applying firm pressure to the area. Your puncture site feels warm to the touch. You have pus or a bad smell coming from your puncture site. You have a fever. You have chest pain or discomfort that spreads to your neck, jaw, or arm. You are sweating a lot. You feel nauseous. You have a fast or irregular heartbeat. You have shortness of breath. You are dizzy or light-headed and feel the need to lie down. You have pain or numbness in the arm or leg closest to your puncture site. Get help right away if: Your puncture site suddenly swells. Your puncture site is bleeding and the bleeding does not stop after applying firm pressure to the area. These symptoms may represent a serious problem that is an emergency. Do not wait to see if the symptoms will go away. Get medical help right away. Call your local emergency services (911 in the [place] Do not drive yourself to the hospital. Summary After the procedure, it is normal to have bruising and tenderness at the puncture site in your groin, neck, or forearm. Check your puncture site every day for signs of infection. Get help right away if your puncture site is bleeding and the bleeding does not stop after applying firm pressure to the area. This is a medical emergency.  This information is not intended to replace advice given to you by your health care provider. Make sure you discuss any questions you have with your health care provider.

## 2023-10-22 NOTE — Anesthesia Preprocedure Evaluation (Addendum)
 Anesthesia Evaluation  Patient identified by MRN, date of birth, ID band Patient awake    Reviewed: Allergy & Precautions, NPO status , Patient's Chart, lab work & pertinent test results  History of Anesthesia Complications Negative for: history of anesthetic complications  Airway Mallampati: III  TM Distance: >3 FB Neck ROM: Full    Dental  (+) Teeth Intact, Dental Advisory Given, Poor Dentition, Chipped,    Pulmonary neg sleep apnea, neg recent URI   breath sounds clear to auscultation       Cardiovascular hypertension, + CAD  (-) Cardiac Stents + dysrhythmias Atrial Fibrillation  Rhythm:Irregular Rate:Normal  TTE (05/2023): 1. Left ventricular ejection fraction, by estimation, is 50 to 55%. The  left ventricle has low normal function. The left ventricle has no regional  wall motion abnormalities. There is mild concentric left ventricular  hypertrophy.   2. Right ventricular systolic function is normal. The right ventricular  size is normal. There is mildly elevated pulmonary artery systolic  pressure.   3. The mitral valve is normal in structure. No evidence of mitral valve  regurgitation. No evidence of mitral stenosis. Moderate mitral annular  calcification.   4. Tricuspid valve regurgitation is mild to moderate.   5. The aortic valve is normal in structure. Aortic valve regurgitation is  not visualized. No aortic stenosis is present.   6. There is mild dilatation of the aortic root, measuring 39 mm.   7. The inferior vena cava is normal in size with <50% respiratory  variability, suggesting right atrial pressure of 8 mmHg.     Neuro/Psych    GI/Hepatic PUD,GERD  Medicated,,S/p Nissen Procedure   Endo/Other  diabetes, Type 2Hypothyroidism    Renal/GU      Musculoskeletal   Abdominal   Peds  Hematology  (+) Blood dyscrasia, anemia   Anesthesia Other Findings   Reproductive/Obstetrics                               Anesthesia Physical Anesthesia Plan  ASA: 3  Anesthesia Plan: General   Post-op Pain Management:    Induction: Intravenous  PONV Risk Score and Plan: 1 and Ondansetron , Dexamethasone  and Treatment may vary due to age or medical condition  Airway Management Planned: Oral ETT  Additional Equipment:   Intra-op Plan:   Post-operative Plan: Extubation in OR  Informed Consent:      Dental advisory given  Plan Discussed with: CRNA and Surgeon  Anesthesia Plan Comments:          Anesthesia Quick Evaluation

## 2023-10-22 NOTE — H&P (Signed)
 Electrophysiology Note:   Date:  10/22/23  ID:  Brett Matthews, DOB [birth year], MRN [id number]   Primary Cardiologist: Gordy Bergamo, MD Electrophysiologist: Fonda Kitty, MD       History of Present Illness:   Brett Matthews is a 89 y.o. male with h/o paroxysmal atrial fibrillation, hypertension, hyperlipidemia, controlled diabetes mellitus, moderate coronary artery disease, GI bleeding and acute blood loss anemia who is being seen today for evaluation for Watchman device implant at the request of Dr. Bergamo.   Discussed the use of AI scribe software for clinical note transcription with the patient, who gave verbal consent to proceed.   History of Present Illness Brett Matthews is an 89 year old male with atrial fibrillation who presents for evaluation of bleeding complications related to blood thinner use. He was referred by Dr. Bergamo for evaluation of Watchman device implant. He has previously experienced bleeding in his stool, which was investigated with a colonoscopy that did not identify the source of bleeding. This led to significant blood loss, necessitating the temporary cessation of his blood thinner medication.  He is very fearful of stroke while off anticoagulation.  He is otherwise doing relatively well.  With no new or acute complaints today.    Interval: Patient presents today for planned Watchman implant. Reports feeling relatively well. No new or acute complaints.   Review of systems complete and found to be negative unless listed in HPI.    EP Information / Studies Reviewed:     EKG is not ordered today. EKG from 07/17/23 reviewed which showed SR with RBBB      EKG 05/15/23:     Echo 05/16/23:  1. Left ventricular ejection fraction, by estimation, is 50 to 55%. The  left ventricle has low normal function. The left ventricle has no regional  wall motion abnormalities. There is mild concentric left ventricular  hypertrophy.   2. Right ventricular systolic function is  normal. The right ventricular  size is normal. There is mildly elevated pulmonary artery systolic  pressure.   3. The mitral valve is normal in structure. No evidence of mitral valve  regurgitation. No evidence of mitral stenosis. Moderate mitral annular  calcification.   4. Tricuspid valve regurgitation is mild to moderate.   5. The aortic valve is normal in structure. Aortic valve regurgitation is  not visualized. No aortic stenosis is present.   6. There is mild dilatation of the aortic root, measuring 39 mm.   7. The inferior vena cava is normal in size with <50% respiratory  variability, suggesting right atrial pressure of 8 mmHg.    Risk Assessment/Calculations:     CHA2DS2-VASc Score = 5   This indicates a 7.2% annual risk of stroke. The patient's score is based upon: CHF History: 0 HTN History: 1 Diabetes History: 1 Stroke History: 0 Vascular Disease History: 1 Age Score: 2 Gender Score: 0               Physical Exam:    Today's Vitals   10/22/23 1041 10/22/23 1203  BP: (!) 173/83   Pulse: 65   Resp: 19   Temp: 97.8 F (36.6 C)   TempSrc: Oral   SpO2: 95%   Weight: 87.5 kg   Height: 5' 9 (1.753 m)   PainSc:  0-No pain   Body mass index is 28.5 kg/m.   GEN: Well nourished, well developed in no acute distress NECK: No JVD CARDIAC: Normal rate, regular rhythm RESPIRATORY:  Clear  to auscultation without rales, wheezing or rhonchi  ABDOMEN: Soft, non-distended EXTREMITIES:  No edema; No deformity    ASSESSMENT AND PLAN:     I have seen Brett Matthews in the office today who is being considered for a Watchman left atrial appendage closure device. I believe they will benefit from this procedure given their history of atrial fibrillation, CHA2DS2-VASc score of 5 and unadjusted ischemic stroke rate of 7.2% per year. Unfortunately, the patient is not felt to be a long term anticoagulation candidate secondary to bleeding from a non-reversible cause. The  patient's chart has been reviewed and I feel that they would be a candidate for short term oral anticoagulation after Watchman implant.    It is my belief that after undergoing a LAA closure procedure, Brett Matthews will not need long term anticoagulation which eliminates anticoagulation side effects and major bleeding risk.    Procedural risks for the Watchman implant have been reviewed with the patient including a 0.5% risk of stroke, <1% risk of perforation and <1% risk of device embolization. Other risks include bleeding, vascular damage, tamponade, worsening renal function, and death. The patient understands these risk and wishes to proceed.       The published clinical data on the safety and effectiveness of WATCHMAN include but are not limited to the following: - Holmes DR, Jess BEARD, Sick P et al. for the PROTECT AF Investigators. Percutaneous closure of the left atrial appendage versus warfarin therapy for prevention of stroke in patients with atrial fibrillation: a randomised non-inferiority trial. Lancet 2009; 374: 534-42. GLENWOOD Jess BEARD, Doshi SK, Jonita VEAR Satchel D et al. on behalf of the PROTECT AF Investigators. Percutaneous Left Atrial Appendage Closure for Stroke Prophylaxis in Patients With Atrial Fibrillation 2.3-Year Follow-up of the PROTECT AF (Watchman Left Atrial Appendage System for Embolic Protection in Patients With Atrial Fibrillation) Trial. Circulation 2013; 127:720-729. - Alli O, Doshi S,  Kar S, Reddy VY, Sievert H et al. Quality of Life Assessment in the Randomized PROTECT AF (Percutaneous Closure of the Left Atrial Appendage Versus Warfarin Therapy for Prevention of Stroke in Patients With Atrial Fibrillation) Trial of Patients at Risk for Stroke With Nonvalvular Atrial Fibrillation. J Am Coll Cardiol 2013; 61:1790-8. GLENWOOD Satchel DR, Archer RAMAN, Price M, Whisenant B, Sievert H, Doshi S, Huber K, Reddy V. Prospective randomized evaluation of the Watchman left atrial appendage Device  in patients with atrial fibrillation versus long-term warfarin therapy; the PREVAIL trial. Journal of the [employer] of Cardiology, Vol. 4, No. 1, 2014, 1-11. - Kar S, Doshi SK, Sadhu A, Horton R, Osorio J et al. Primary outcome evaluation of a next-generation left atrial appendage closure device: results from the PINNACLE FLX trial. Circulation 2021;143(18)1754-1762.      After today's visit with the patient which was dedicated solely for shared decision making visit regarding LAA closure device, the patient decided to proceed with the LAA appendage closure procedure scheduled to be done in the near future at [facility]. Prior to the procedure, I would like to obtain a gated CT scan of the chest with contrast timed for PV/LA visualization.    HAS-BLED score 3 Hypertension Yes  Abnormal renal and liver function (Dialysis, transplant, Cr >2.26 mg/dL /Cirrhosis or Bilirubin >2x Normal or AST/ALT/AP >3x Normal) No  Stroke No  Bleeding Yes  Labile INR (Unstable/high INR) No  Elderly (>65) Yes  Drugs or alcohol  (>= 8 drinks/week, anti-plt or NSAID) No    CHA2DS2-VASc Score = 5  The patient's score is based upon: CHF History: 0 HTN History: 1 Diabetes History: 1 Stroke History: 0 Vascular Disease History: 1 Age Score: 2 Gender Score: 0         ASSESSMENT AND PLAN: Paroxysmal Atrial Fibrillation The patient's CHA2DS2-VASc score is 5, indicating a 7.2% annual risk of stroke.   Continue carvedilol  25 mg twice daily.   Secondary Hypercoagulable State GI bleeding: No obvious source identified. The patient is at significant risk for stroke/thromboembolism based upon his CHA2DS2-VASc Score of 5.  Patient has been tolerating Eliquis  2.5mg  BID. Risks and benefits of Watchman device reviewed with patient and family again today and he wishes to proceed.    Follow up with Dr. Kennyth 3 months after [facility] implant.    Signed, Fonda Kennyth, MD

## 2023-10-22 NOTE — Anesthesia Procedure Notes (Signed)
 Procedure Name: Intubation Date/Time: 10/22/2023 4:00 PM  Performed by: Zelphia Norleen HERO, CRNAPre-anesthesia Checklist: Patient identified, Emergency Drugs available, Suction available and Patient being monitored Patient Re-evaluated:Patient Re-evaluated prior to induction Oxygen  Delivery Method: Circle system utilized Preoxygenation: Pre-oxygenation with 100% oxygen  Induction Type: IV induction Ventilation: Mask ventilation without difficulty and Oral airway inserted - appropriate to patient size Laryngoscope Size: Mac and 3 Grade View: Grade I Tube type: Oral Tube size: 7.0 mm Number of attempts: 1 Airway Equipment and Method: Stylet and Oral airway Placement Confirmation: ETT inserted through vocal cords under direct vision, positive ETCO2 and breath sounds checked- equal and bilateral Secured at: 23 cm Tube secured with: Tape Dental Injury: Teeth and Oropharynx as per pre-operative assessment

## 2023-10-22 NOTE — Transfer of Care (Signed)
 Immediate Anesthesia Transfer of Care Note  Patient: Brett Matthews  Procedure(s) Performed: LEFT ATRIAL APPENDAGE OCCLUSION TRANSESOPHAGEAL ECHOCARDIOGRAM  Patient Location: Cath Lab  Anesthesia Type:General  Level of Consciousness: awake and alert   Airway & Oxygen  Therapy: Patient Spontanous Breathing and Patient connected to nasal cannula oxygen   Post-op Assessment: Report given to RN and Post -op Vital signs reviewed and stable  Post vital signs: Reviewed and stable  Last Vitals:  Vitals Value Taken Time  BP 161/85 10/22/23 17:20  Temp 36.9 C 10/22/23 17:20  Pulse 69 10/22/23 17:22  Resp 16 10/22/23 17:22  SpO2 91 % 10/22/23 17:22  Vitals shown include unfiled device data.  Last Pain:  Vitals:   10/22/23 1720  TempSrc: Temporal  PainSc: 0-No pain         Complications: There were no known notable events for this encounter.

## 2023-10-23 ENCOUNTER — Encounter (HOSPITAL_COMMUNITY): Payer: Self-pay | Admitting: Cardiology

## 2023-10-23 DIAGNOSIS — D6869 Other thrombophilia: Secondary | ICD-10-CM

## 2023-10-23 DIAGNOSIS — I480 Paroxysmal atrial fibrillation: Secondary | ICD-10-CM | POA: Diagnosis not present

## 2023-10-23 DIAGNOSIS — K922 Gastrointestinal hemorrhage, unspecified: Secondary | ICD-10-CM | POA: Diagnosis not present

## 2023-10-23 DIAGNOSIS — Z95818 Presence of other cardiac implants and grafts: Secondary | ICD-10-CM | POA: Diagnosis not present

## 2023-10-23 LAB — GLUCOSE, CAPILLARY
Glucose-Capillary: 250 mg/dL — ABNORMAL HIGH (ref 70–99)
Glucose-Capillary: 218 mg/dL — ABNORMAL HIGH (ref 70–99)

## 2023-10-23 MED FILL — Fentanyl Citrate Preservative Free (PF) Inj 100 MCG/2ML: INTRAMUSCULAR | Qty: 1 | Status: AC

## 2023-10-23 NOTE — Progress Notes (Signed)
 10/23/2023 12:45 PM -----------------------------------------------------------[facility]--------------------------------------------------- D(Data) A(Action) R(response)     Data: Discharge Readiness Assessment EDD today 10/23/2023    Action: Chart reviewed    Response: No immediate Barriers to discharge identified at this time.     Tyreon Frigon, RN [employer]

## 2023-10-23 NOTE — TOC CM/SW Note (Signed)
 Transition of Care [facility]) - Inpatient Brief Assessment   Patient Details  Name: Brett Matthews MRN: [id number] Date of Birth: 1932-08-18  Transition of Care [facility]) CM/SW Contact:    Sudie Erminio Deems, RN Phone Number: 10/23/2023, 12:02 PM   Clinical Narrative: Patient presented for WATCHMAN. PTA patient was from home with significant other and her daughter. Patient states he has PCP and has transportation to appointments. Patient states [facility] will transport him home via private vehicle. No further needs identified.    Transition of Care Asessment: Insurance and Status: Insurance coverage has been reviewed Patient has primary care physician: Yes Home environment has been reviewed: reviewed Prior level of function:: independent Prior/Current Home Services: No current home services Social Drivers of Health Review: SDOH reviewed no interventions necessary Readmission risk has been reviewed: Yes Transition of care needs: no transition of care needs at this time

## 2023-10-23 NOTE — Anesthesia Postprocedure Evaluation (Signed)
 Anesthesia Post Note  Patient: Brett Matthews  Procedure(s) Performed: LEFT ATRIAL APPENDAGE OCCLUSION TRANSESOPHAGEAL ECHOCARDIOGRAM     Patient location during evaluation: PACU Anesthesia Type: General Level of consciousness: awake and alert Pain management: pain level controlled Vital Signs Assessment: post-procedure vital signs reviewed and stable Respiratory status: spontaneous breathing, nonlabored ventilation, respiratory function stable and patient connected to nasal cannula oxygen  Cardiovascular status: blood pressure returned to baseline and stable Postop Assessment: no apparent nausea or vomiting Anesthetic complications: no   There were no known notable events for this encounter.  Last Vitals:  Vitals:   10/23/23 0548 10/23/23 0740  BP: (!) 156/94 (!) 173/102  Pulse: 96 98  Resp: 18 16  Temp: 37.2 C 37.2 C  SpO2: 95% 98%    Last Pain:  Vitals:   10/23/23 0740  TempSrc: Oral  PainSc:                  Sukhman Kocher E

## 2023-10-23 NOTE — Plan of Care (Signed)

## 2023-10-23 NOTE — Discharge Summary (Addendum)
 Electrophysiology Discharge Summary   Patient ID: MARVEN VELEY,  MRN: [id number], DOB/AGE: 14-Jun-1932 89 y.o.  Admit date: 10/22/2023 Discharge date: 10/23/2023  Primary Care Physician: Rosamond Leta NOVAK, MD  Primary Cardiologist: Gordy Bergamo, MD  Electrophysiologist: Fonda Kitty, MD     Primary Discharge Diagnosis:  Paroxysmal Atrial Fibrillation Poor candidacy for long term anticoagulation due to h/o GI bleeding with ABLA  Secondary Discharge Diagnosis:  HTN HLD  Moderate CAD Hx GIB with ABLA  DM   Procedures This Admission:  Transeptal Puncture Intra-procedural TEE which showed no LAA thrombus Left atrial appendage occlusive device placement on 10/22/23 by Dr. Kitty.   This study demonstrated: Successful implantation of a 27mm WATCHMAN Flx Pro left atrial appendage occlusive device.    TEE demonstrating no LAA thrombus. No early apparent complications.    Post Implant Anticoagulation Strategy: Continue Eliquis  2.5mg  twice daily for 45 days then transition to dual antiplatelet therapy with asprin 81mg  and clopidogrel 75mg  daily. Repeat imaging with CT scan in 60 days.     Brief HPI: JARICK HARKINS is a 89 y.o. male with a history of Paroxysmal Atrial Fibrillation who was referred to Electrophysiology in the outpatient setting    Hospital Course:  The patient was admitted and underwent left atrial appendage occlusive device placement as above.  The patient was monitored overnight and has done very well with no concerns. Groin site has been stable without evidence of hematoma or bleeding. Wound care and restrictions were reviewed with the patient.   The patient has been scheduled for post procedure follow up with EP APP in approximately 6 weeks. They will restart Eliquis  this evening and continue for 45 days then stop. At that time he will transition to Plavix 75mg  + ASA 81 mg daily to complete 6 months of therapy. They will require dental SBE for 6 month post op and  should refrain from dental work or cleanings for the first 45 days post implant. SBE to be RXd at follow up.   A repeat CT scan will be performed in approximately 60 days to ensure proper seal of the device.    Physical Exam: Vitals:   10/22/23 2329 10/23/23 0000 10/23/23 0548 10/23/23 0740  BP: (!) 145/88 135/87 (!) 156/94 (!) 173/102  Pulse: 90 91 96 98  Resp: 18  18 16   Temp: 97.7 F (36.5 C)  98.9 F (37.2 C) 99 F (37.2 C)  TempSrc: Oral  Oral Oral  SpO2: 98% 98% 95% 98%  Weight:      Height:        GEN: Well nourished, well developed in no acute distress NECK: No JVD; No carotid bruits CARDIAC: Regular rate and rhythm, no murmurs, rubs, gallops RESPIRATORY:  Clear to auscultation without rales, wheezing or rhonchi  ABDOMEN: Soft, non-tender, non-distended EXTREMITIES:  No edema; No deformity. Groin site Stable     Discharge Medications:  Allergies as of 10/23/2023   No Known Allergies      Medication List     TAKE these medications    acetaminophen  500 MG tablet Commonly known as: TYLENOL  Take 2 tablets (1,000 mg total) by mouth every 6 (six) hours as needed for mild pain or moderate pain.   albuterol  108 (90 Base) MCG/ACT inhaler Commonly known as: VENTOLIN  HFA Inhale 2 puffs into the lungs every 4 (four) hours as needed for wheezing or shortness of breath.   apixaban  2.5 MG Tabs tablet Commonly known as: ELIQUIS  Take 1 tablet (2.5  mg total) by mouth 2 (two) times daily. Start taking on 09/22/2023.   atorvastatin  20 MG tablet Commonly known as: LIPITOR  Take 1 tablet (20 mg total) by mouth daily.   bismuth subsalicylate 262 MG/15ML suspension Commonly known as: PEPTO BISMOL Take 30 mLs by mouth every 6 (six) hours as needed (stomach upset).   carvedilol  25 MG tablet Commonly known as: COREG  Take 1 tablet (25 mg total) by mouth 2 (two) times daily with a meal.   cholecalciferol  25 MCG (1000 UNIT) tablet Commonly known as: VITAMIN D3 Take 1,000  Units by mouth daily.   citalopram  40 MG tablet Commonly known as: CELEXA  Take 40 mg by mouth daily.   diltiazem  240 MG 24 hr capsule Commonly known as: CARDIZEM  CD Take 240 mg by mouth daily.   docusate sodium  100 MG capsule Commonly known as: Colace Take 1 capsule (100 mg total) by mouth 2 (two) times daily. What changed:  when to take this reasons to take this   famotidine  40 MG tablet Commonly known as: PEPCID  Take 40 mg by mouth daily.   folic acid  1 MG tablet Commonly known as: FOLVITE  Take 1 mg by mouth daily.   Fruit & Vegetable Daily Caps Take 2 capsules by mouth daily.   furosemide  20 MG tablet Commonly known as: LASIX  Take 40 mg by mouth daily.   glimepiride  2 MG tablet Commonly known as: AMARYL  Take 2 mg by mouth 2 (two) times daily.   hydrALAZINE  25 MG tablet Commonly known as: APRESOLINE  Take 25 mg by mouth 3 (three) times daily.   insulin  degludec 100 UNIT/ML FlexTouch Pen Commonly known as: TRESIBA  Inject 14 Units into the skin at bedtime.   levocetirizine 5 MG tablet Commonly known as: XYZAL Take 5 mg by mouth at bedtime.   levothyroxine  125 MCG tablet Commonly known as: SYNTHROID  Take 125 mcg by mouth daily at 2 am.   metFORMIN  500 MG tablet Commonly known as: GLUCOPHAGE  Take 500-1,000 mg by mouth See admin instructions. Take 2 tabs (1,000mg ) by mouth every morning & 1 tab (500mg ) every evening   mupirocin ointment 2 % Commonly known as: BACTROBAN 1 Application 2 (two) times daily.   olmesartan  20 MG tablet Commonly known as: BENICAR  Take 1 tablet (20 mg total) by mouth daily.   One Daily Multivitamin Men Tabs Take 1 tablet by mouth daily.   PRESERVISION AREDS PO Take 1 tablet by mouth in the morning and at bedtime.   pantoprazole  40 MG tablet Commonly known as: PROTONIX  Take 40 mg by mouth daily at 12 noon.   potassium chloride  10 MEQ tablet Commonly known as: KLOR-CON  Take 10 mEq by mouth daily.   tamsulosin  0.4 MG Caps  capsule Commonly known as: FLOMAX  Take 0.4 mg by mouth daily after breakfast.        Disposition:  Home with usual follow up as in AVS   Signed, Daphne Barrack, NP-C, AGACNP-BC [facility] - Electrophysiology  10/23/2023, 11:06 AM   I have seen, examined the patient, and reviewed the above assessment and plan.    Hospital Course: Patient presented on 10/22/2023 for planned Watchman device implant.  He underwent successful implantation of a 27 mm Watchman Flex Pro device.  He was kept overnight for routine monitoring.  No acute overnight events. Patient reports feeling relatively well. No new or acute complaints.   General: Well developed, in no acute distress.  Neck: No JVD.  Cardiac: Normal rate, regular rhythm.  Resp: Normal work of  breathing.  Ext: No edema.  Right groin site without bleeding or hematoma. Neuro: No gross focal deficits.  Psych: Normal affect.   Assessment and Plan:  #. S/p Watchman #. Paroxysmal atrial fibrillation #.  Hypercoagulable state due to AF #. GI bleeding - Continue Eliquis  2.5 mg twice daily for 45 days.  Then transition to dual antiplatelet therapy with aspirin  and Plavix.   - Repeat imaging with CT scan to assess device position and for clot/thrombus in 60 days. - Follow-up appointment with EP APP in 45 days has been arranged.  Total duration of discharge encounter: 35 minutes.  Fonda Kitty, MD 10/23/2023 6:52 PM

## 2023-10-26 ENCOUNTER — Telehealth: Payer: Self-pay

## 2023-10-26 DIAGNOSIS — Z95818 Presence of other cardiac implants and grafts: Secondary | ICD-10-CM

## 2023-10-26 DIAGNOSIS — I480 Paroxysmal atrial fibrillation: Secondary | ICD-10-CM

## 2023-10-26 NOTE — Addendum Note (Signed)
 Addended by: WAYLON EDSEL HERO on: 10/26/2023 02:15 PM   Modules accepted: Orders

## 2023-10-26 NOTE — Telephone Encounter (Signed)
  [facility]   Watchman Team  Contacted the patient regarding discharge from [facility] on 10/23/2023.  The patient understands to follow up with Jodie Passey, PA-C on 12/07/2023.  The patient understands discharge instructions? Yes  The patient understands medications and regimen? Yes   The patient reports groin sites are healing well with no drainage, swelling or pain.  Scheduled post procedure imaging on 12/21/2023.  The patient understands to call with any questions or concerns prior to scheduled visit.   The patient was grateful for call and agreed with plan.

## 2023-10-26 NOTE — Telephone Encounter (Signed)
 Left voicemail for patient to return call to complete post watchman follow up call.

## 2023-10-27 DIAGNOSIS — E1140 Type 2 diabetes mellitus with diabetic neuropathy, unspecified: Secondary | ICD-10-CM | POA: Diagnosis not present

## 2023-10-27 DIAGNOSIS — I10 Essential (primary) hypertension: Secondary | ICD-10-CM | POA: Diagnosis not present

## 2023-10-27 DIAGNOSIS — Z299 Encounter for prophylactic measures, unspecified: Secondary | ICD-10-CM | POA: Diagnosis not present

## 2023-10-27 DIAGNOSIS — F1320 Sedative, hypnotic or anxiolytic dependence, uncomplicated: Secondary | ICD-10-CM | POA: Diagnosis not present

## 2023-10-27 DIAGNOSIS — I4891 Unspecified atrial fibrillation: Secondary | ICD-10-CM | POA: Diagnosis not present

## 2023-10-27 DIAGNOSIS — Z09 Encounter for follow-up examination after completed treatment for conditions other than malignant neoplasm: Secondary | ICD-10-CM | POA: Diagnosis not present

## 2023-11-19 DIAGNOSIS — E1142 Type 2 diabetes mellitus with diabetic polyneuropathy: Secondary | ICD-10-CM | POA: Diagnosis not present

## 2023-11-19 DIAGNOSIS — L84 Corns and callosities: Secondary | ICD-10-CM | POA: Diagnosis not present

## 2023-11-19 DIAGNOSIS — B351 Tinea unguium: Secondary | ICD-10-CM | POA: Diagnosis not present

## 2023-11-19 DIAGNOSIS — M79675 Pain in left toe(s): Secondary | ICD-10-CM | POA: Diagnosis not present

## 2023-11-19 DIAGNOSIS — M79674 Pain in right toe(s): Secondary | ICD-10-CM | POA: Diagnosis not present

## 2023-11-24 ENCOUNTER — Telehealth: Payer: Self-pay

## 2023-11-24 NOTE — Telephone Encounter (Signed)
 Brett Matthews returned call. She can take patient 11/26 at 2:30pm.

## 2023-11-24 NOTE — Telephone Encounter (Signed)
 Received message from the scheduling team that clinic on 12/1 is being closed and the patient needs to be rescheduled. The Watchman Team spoke with the patient and he will call back to confirm a new appointment time.  Will attempt to reschedule to: 11/26 at 1430 with Dr. Kennyth OR  12/4 at 1015 with Dr. Kennyth

## 2023-11-24 NOTE — Telephone Encounter (Addendum)
 Spoke with patient. Explained need to cancel OV 12/1 with Jodie Passey due to clinic closing. He asked a call be made to [facility] to reschedule.  Spoke with [employer]. She is going to speak with Nathanel or Ellouise (who will drive patient) if 88/73 at 2:30 with Dr. Kennyth will work. She will call back.

## 2023-12-01 ENCOUNTER — Encounter: Payer: Self-pay | Admitting: Internal Medicine

## 2023-12-01 NOTE — Progress Notes (Unsigned)
 Electrophysiology Office Note:   Date:  12/03/2023  ID:  VADA YELLEN, DOB December 16, 1932, MRN [id number]  Primary Cardiologist: Gordy Bergamo, MD Electrophysiologist: Fonda Kitty, MD      History of Present Illness:   Brett Matthews is a 89 y.o. male with h/o paroxysmal atrial fibrillation, hypertension, hyperlipidemia, controlled diabetes mellitus, moderate coronary artery disease, GI bleeding and acute blood loss anemia who is being seen today for follow up evaluation.   Discussed the use of AI scribe software for clinical note transcription with the patient, who gave verbal consent to proceed.  History of Present Illness  Brett Matthews is an 89 year old male who presents for a six-week follow-up after a cardiac procedure.  He underwent a cardiac procedure involving the placement of a Watchman device on 10/22/23.  He reports no issues following the procedure and has recovered well. He recently had a dental cleaning and is not scheduled for any upcoming dental work.   Review of systems complete and found to be negative unless listed in HPI.   EP Information / Studies Reviewed:    EKG is not ordered today. EKG from 07/17/23 reviewed which showed SR with RBBB     EKG 05/15/23:    Echo 05/16/23:  1. Left ventricular ejection fraction, by estimation, is 50 to 55%. The  left ventricle has low normal function. The left ventricle has no regional  wall motion abnormalities. There is mild concentric left ventricular  hypertrophy.   2. Right ventricular systolic function is normal. The right ventricular  size is normal. There is mildly elevated pulmonary artery systolic  pressure.   3. The mitral valve is normal in structure. No evidence of mitral valve  regurgitation. No evidence of mitral stenosis. Moderate mitral annular  calcification.   4. Tricuspid valve regurgitation is mild to moderate.   5. The aortic valve is normal in structure. Aortic valve regurgitation is  not  visualized. No aortic stenosis is present.   6. There is mild dilatation of the aortic root, measuring 39 mm.   7. The inferior vena cava is normal in size with <50% respiratory  variability, suggesting right atrial pressure of 8 mmHg.   Risk Assessment/Calculations:    CHA2DS2-VASc Score = 5   This indicates a 7.2% annual risk of stroke. The patient's score is based upon: CHF History: 0 HTN History: 1 Diabetes History: 1 Stroke History: 0 Vascular Disease History: 1 Age Score: 2 Gender Score: 0             Physical Exam:   VS:  BP 126/78   Pulse (!) 56   Ht 5' 9 (1.753 m)   Wt 198 lb 3.2 oz (89.9 kg)   SpO2 97%   BMI 29.27 kg/m    Wt Readings from Last 3 Encounters:  12/02/23 198 lb 3.2 oz (89.9 kg)  10/22/23 193 lb (87.5 kg)  10/13/23 193 lb (87.5 kg)     GEN: Well nourished, well developed in no acute distress NECK: No JVD CARDIAC: Normal rate, regular rhythm RESPIRATORY:  Clear to auscultation without rales, wheezing or rhonchi  ABDOMEN: Soft, non-distended EXTREMITIES:  No edema; No deformity   ASSESSMENT AND PLAN:    Paroxysmal Atrial Fibrillation The patient's CHA2DS2-VASc score is 5, indicating a 7.2% annual risk of stroke.   Continue carvedilol  25 mg twice daily.  S/p Watchman device: Doing well post implant. Secondary Hypercoagulable State GI bleeding: No obvious source identified. Patient will stop Eliquis  2.5 mg  twice daily and transition to dual antiplatelet therapy with aspirin  81 mg and Plavix  75 mg daily.  He will continue this until 6 months post implant then transition to aspirin  81 mg once daily alone. He is scheduled for a CT scan to assess Watchman device position on 12/21/2023. He will be prescribed antibiotic prophylaxis for the next 6 months.  He is not planning for any dental visits during this time.   Signed, Fonda Kitty, MD

## 2023-12-02 ENCOUNTER — Ambulatory Visit: Attending: Cardiology | Admitting: Cardiology

## 2023-12-02 ENCOUNTER — Encounter: Payer: Self-pay | Admitting: Cardiology

## 2023-12-02 VITALS — BP 126/78 | HR 56 | Ht 69.0 in | Wt 198.2 lb

## 2023-12-02 DIAGNOSIS — Z95818 Presence of other cardiac implants and grafts: Secondary | ICD-10-CM

## 2023-12-02 DIAGNOSIS — D6869 Other thrombophilia: Secondary | ICD-10-CM

## 2023-12-02 DIAGNOSIS — I480 Paroxysmal atrial fibrillation: Secondary | ICD-10-CM

## 2023-12-02 MED ORDER — CLOPIDOGREL BISULFATE 75 MG PO TABS: 75.0000 mg | ORAL_TABLET | Freq: Every day | ORAL | 3 refills | Status: AC

## 2023-12-02 MED ORDER — ASPIRIN 81 MG PO TBEC
81.0000 mg | DELAYED_RELEASE_TABLET | Freq: Every day | ORAL | Status: AC
Start: 2023-12-02 — End: ?

## 2023-12-02 NOTE — Patient Instructions (Signed)
 Medication Instructions:  Please discontinue your Eliquis . Start Asprin 81 mg a day and Plavix  (Clopidogrel ) 75 mg daily. Continue all other medications as listed.   *If you need a refill on your cardiac medications before your next appointment, please call your pharmacy*  Please take antibiotic prophylaxis before any dental procedures for the next 5 months.  Follow-Up: At [facility], you and your health needs are our priority.  As part of our continuing mission to provide you with exceptional heart care, our providers are all part of one team.  This team includes your primary Cardiologist (physician) and Advanced Practice Providers or APPs (Physician Assistants and Nurse Practitioners) who all work together to provide you with the care you need, when you need it.  Your next appointment:   To be determined.   We recommend signing up for the patient portal called MyChart.  Sign up information is provided on this After Visit Summary.  MyChart is used to connect with patients for Virtual Visits (Telemedicine).  Patients are able to view lab/test results, encounter notes, upcoming appointments, etc.  Non-urgent messages can be sent to your provider as well.   To learn more about what you can do with MyChart, go to [email or web address].

## 2023-12-03 MED ORDER — AMOXICILLIN 500 MG PO TABS
2000.0000 mg | ORAL_TABLET | ORAL | 0 refills | Status: AC | PRN
Start: 2024-05-02 — End: ?

## 2023-12-07 ENCOUNTER — Ambulatory Visit: Admitting: Student

## 2023-12-21 ENCOUNTER — Ambulatory Visit (HOSPITAL_COMMUNITY): Admission: RE | Admit: 2023-12-21 | Discharge: 2023-12-21 | Attending: Cardiology

## 2023-12-21 DIAGNOSIS — I480 Paroxysmal atrial fibrillation: Secondary | ICD-10-CM

## 2023-12-21 DIAGNOSIS — Z95818 Presence of other cardiac implants and grafts: Secondary | ICD-10-CM

## 2023-12-21 MED ORDER — IOHEXOL 350 MG/ML SOLN
80.0000 mL | Freq: Once | INTRAVENOUS | Status: AC | PRN
  Administered 2023-12-21: 10:00:00 80 mL via INTRAVENOUS

## 2023-12-22 ENCOUNTER — Ambulatory Visit: Payer: Self-pay

## 2023-12-22 DIAGNOSIS — R911 Solitary pulmonary nodule: Secondary | ICD-10-CM

## 2023-12-24 NOTE — Telephone Encounter (Signed)
 Brett Matthews left a voicemail requesting return call regarding results.  Spoke with Brett Matthews (listed on chart okay to receive information). Explained CT showed Watchman device is in place with no clot or leak. Patient should continue his medication plan as dictated by Dr. Kennyth at last OV. Explained will contact Brett to arrange 6 month follow up once schedule allows. She verbalized understanding and was grateful for the call.

## 2024-01-11 NOTE — Addendum Note (Signed)
 Addended by: MAGDALINE NEEDLE on: 01/11/2024 03:19 PM   Modules accepted: Orders

## 2024-01-25 LAB — OPHTHALMOLOGY REPORT-SCANNED

## 2024-03-31 ENCOUNTER — Ambulatory Visit: Admitting: Cardiology
